# Patient Record
Sex: Female | Born: 1960 | Race: White | Hispanic: No | Smoking: Current every day smoker
Health system: Southern US, Community
[De-identification: ages and names within clinical notes are randomized; demographics above are authoritative.]

## PROBLEM LIST (undated history)

## (undated) DIAGNOSIS — K760 Fatty (change of) liver, not elsewhere classified: Secondary | ICD-10-CM

## (undated) DIAGNOSIS — R0600 Dyspnea, unspecified: Secondary | ICD-10-CM

## (undated) DIAGNOSIS — F1010 Alcohol abuse, uncomplicated: Secondary | ICD-10-CM

## (undated) DIAGNOSIS — K635 Polyp of colon: Secondary | ICD-10-CM

## (undated) DIAGNOSIS — R634 Abnormal weight loss: Secondary | ICD-10-CM

## (undated) DIAGNOSIS — Z972 Presence of dental prosthetic device (complete) (partial): Secondary | ICD-10-CM

## (undated) DIAGNOSIS — J449 Chronic obstructive pulmonary disease, unspecified: Secondary | ICD-10-CM

## (undated) DIAGNOSIS — R748 Abnormal levels of other serum enzymes: Secondary | ICD-10-CM

## (undated) DIAGNOSIS — R1012 Left upper quadrant pain: Secondary | ICD-10-CM

## (undated) DIAGNOSIS — F419 Anxiety disorder, unspecified: Secondary | ICD-10-CM

## (undated) DIAGNOSIS — K219 Gastro-esophageal reflux disease without esophagitis: Secondary | ICD-10-CM

## (undated) DIAGNOSIS — G709 Myoneural disorder, unspecified: Secondary | ICD-10-CM

## (undated) DIAGNOSIS — D696 Thrombocytopenia, unspecified: Secondary | ICD-10-CM

## (undated) DIAGNOSIS — I700 Atherosclerosis of aorta: Secondary | ICD-10-CM

## (undated) DIAGNOSIS — G629 Polyneuropathy, unspecified: Secondary | ICD-10-CM

## (undated) DIAGNOSIS — S72009A Fracture of unspecified part of neck of unspecified femur, initial encounter for closed fracture: Secondary | ICD-10-CM

## (undated) DIAGNOSIS — J40 Bronchitis, not specified as acute or chronic: Secondary | ICD-10-CM

## (undated) DIAGNOSIS — B019 Varicella without complication: Secondary | ICD-10-CM

## (undated) DIAGNOSIS — F329 Major depressive disorder, single episode, unspecified: Secondary | ICD-10-CM

## (undated) DIAGNOSIS — J309 Allergic rhinitis, unspecified: Secondary | ICD-10-CM

## (undated) DIAGNOSIS — F32A Depression, unspecified: Secondary | ICD-10-CM

## (undated) DIAGNOSIS — K469 Unspecified abdominal hernia without obstruction or gangrene: Secondary | ICD-10-CM

## (undated) DIAGNOSIS — K759 Inflammatory liver disease, unspecified: Secondary | ICD-10-CM

## (undated) HISTORY — DX: Major depressive disorder, single episode, unspecified: F32.9

## (undated) HISTORY — PX: CHOLECYSTECTOMY: SHX55

## (undated) HISTORY — DX: Varicella without complication: B01.9

## (undated) HISTORY — PX: NO PAST SURGERIES: SHX2092

## (undated) HISTORY — DX: Polyp of colon: K63.5

## (undated) HISTORY — DX: Alcohol abuse, uncomplicated: F10.10

## (undated) HISTORY — PX: EYE SURGERY: SHX253

## (undated) HISTORY — DX: Depression, unspecified: F32.A

## (undated) HISTORY — PX: COLONOSCOPY: SHX174

---

## 2008-06-26 ENCOUNTER — Emergency Department: Payer: Self-pay | Admitting: Emergency Medicine

## 2013-01-31 LAB — HM COLONOSCOPY

## 2013-02-03 LAB — HM PAP SMEAR

## 2013-02-03 LAB — HM MAMMOGRAPHY: HM Mammogram: NORMAL

## 2013-11-23 ENCOUNTER — Ambulatory Visit: Payer: Self-pay | Admitting: Nurse Practitioner

## 2014-01-29 ENCOUNTER — Ambulatory Visit: Payer: Self-pay | Admitting: Gastroenterology

## 2014-02-14 ENCOUNTER — Ambulatory Visit: Payer: Self-pay | Admitting: Surgery

## 2014-02-21 ENCOUNTER — Ambulatory Visit: Payer: Self-pay | Admitting: Surgery

## 2014-03-14 ENCOUNTER — Ambulatory Visit: Payer: Self-pay | Admitting: Surgery

## 2014-03-28 ENCOUNTER — Ambulatory Visit: Payer: Self-pay | Admitting: Gastroenterology

## 2014-07-12 ENCOUNTER — Inpatient Hospital Stay
Admission: EM | Admit: 2014-07-12 | Discharge: 2014-07-16 | DRG: 641 | Disposition: A | Discharge status: Home / Self Care | Payer: 59 | Attending: Internal Medicine | Admitting: Internal Medicine

## 2014-07-12 ENCOUNTER — Encounter: Payer: Self-pay | Admitting: General Practice

## 2014-07-12 DIAGNOSIS — E8729 Other acidosis: Secondary | ICD-10-CM | POA: Diagnosis present

## 2014-07-12 DIAGNOSIS — Z6824 Body mass index (BMI) 24.0-24.9, adult: Secondary | ICD-10-CM | POA: Diagnosis not present

## 2014-07-12 DIAGNOSIS — K449 Diaphragmatic hernia without obstruction or gangrene: Secondary | ICD-10-CM | POA: Diagnosis present

## 2014-07-12 DIAGNOSIS — E876 Hypokalemia: Secondary | ICD-10-CM | POA: Diagnosis present

## 2014-07-12 DIAGNOSIS — R630 Anorexia: Secondary | ICD-10-CM | POA: Diagnosis present

## 2014-07-12 DIAGNOSIS — F17210 Nicotine dependence, cigarettes, uncomplicated: Secondary | ICD-10-CM | POA: Diagnosis present

## 2014-07-12 DIAGNOSIS — E872 Acidosis, unspecified: Secondary | ICD-10-CM

## 2014-07-12 DIAGNOSIS — E860 Dehydration: Secondary | ICD-10-CM | POA: Diagnosis present

## 2014-07-12 DIAGNOSIS — F1024 Alcohol dependence with alcohol-induced mood disorder: Secondary | ICD-10-CM | POA: Insufficient documentation

## 2014-07-12 DIAGNOSIS — Z716 Tobacco abuse counseling: Secondary | ICD-10-CM | POA: Diagnosis present

## 2014-07-12 DIAGNOSIS — F1021 Alcohol dependence, in remission: Secondary | ICD-10-CM

## 2014-07-12 DIAGNOSIS — F1094 Alcohol use, unspecified with alcohol-induced mood disorder: Secondary | ICD-10-CM

## 2014-07-12 DIAGNOSIS — D6959 Other secondary thrombocytopenia: Secondary | ICD-10-CM | POA: Diagnosis present

## 2014-07-12 DIAGNOSIS — F10239 Alcohol dependence with withdrawal, unspecified: Secondary | ICD-10-CM | POA: Diagnosis present

## 2014-07-12 DIAGNOSIS — F329 Major depressive disorder, single episode, unspecified: Secondary | ICD-10-CM | POA: Diagnosis present

## 2014-07-12 DIAGNOSIS — E8889 Other specified metabolic disorders: Secondary | ICD-10-CM | POA: Diagnosis present

## 2014-07-12 DIAGNOSIS — R112 Nausea with vomiting, unspecified: Secondary | ICD-10-CM

## 2014-07-12 DIAGNOSIS — E873 Alkalosis: Secondary | ICD-10-CM | POA: Diagnosis present

## 2014-07-12 DIAGNOSIS — E871 Hypo-osmolality and hyponatremia: Secondary | ICD-10-CM | POA: Diagnosis present

## 2014-07-12 DIAGNOSIS — K2920 Alcoholic gastritis without bleeding: Secondary | ICD-10-CM | POA: Diagnosis present

## 2014-07-12 DIAGNOSIS — F10939 Alcohol use, unspecified with withdrawal, unspecified: Secondary | ICD-10-CM

## 2014-07-12 DIAGNOSIS — R197 Diarrhea, unspecified: Secondary | ICD-10-CM

## 2014-07-12 HISTORY — DX: Unspecified abdominal hernia without obstruction or gangrene: K46.9

## 2014-07-12 HISTORY — DX: Other acidosis: E87.29

## 2014-07-12 LAB — SALICYLATE LEVEL: Salicylate Lvl: <4 mg/dL (ref 2.8–30.0)

## 2014-07-12 LAB — ACETAMINOPHEN LEVEL: Acetaminophen (Tylenol), Serum: <10 ug/mL — ABNORMAL LOW (ref 10–30)

## 2014-07-12 LAB — BASIC METABOLIC PANEL
Anion gap: 30 — ABNORMAL HIGH (ref 5–15)
BUN: 10 mg/dL (ref 6–20)
CO2: 10 mmol/L — ABNORMAL LOW (ref 22–32)
Calcium: 9.6 mg/dL (ref 8.9–10.3)
Chloride: 96 mmol/L — ABNORMAL LOW (ref 101–111)
Creatinine, Ser: 0.95 mg/dL (ref 0.44–1.00)
GFR calc Af Amer: >60 mL/min (ref >60)
GFR calc non Af Amer: >60 mL/min (ref >60)
Glucose, Bld: 112 mg/dL — ABNORMAL HIGH (ref 65–99)
Potassium: 4.2 mmol/L (ref 3.5–5.1)
Sodium: 136 mmol/L (ref 135–145)

## 2014-07-12 LAB — LACTIC ACID, PLASMA
Lactic Acid, Venous: 1.7 mmol/L (ref 0.5–2.0)
Lactic Acid, Venous: 1.8 mmol/L (ref 0.5–2.0)

## 2014-07-12 LAB — CBC WITH DIFFERENTIAL/PLATELET
Basophils Absolute: 0.1 10*3/uL (ref 0–0.1)
Basophils Relative: 1 %
Eosinophils Absolute: 0 10*3/uL (ref 0–0.7)
Eosinophils Relative: 0 %
HCT: 47.3 % — ABNORMAL HIGH (ref 35.0–47.0)
Hemoglobin: 15.8 g/dL (ref 12.0–16.0)
Lymphocytes Relative: 14 %
Lymphs Abs: 1.3 10*3/uL (ref 1.0–3.6)
MCH: 37.6 pg — ABNORMAL HIGH (ref 26.0–34.0)
MCHC: 33.5 g/dL (ref 32.0–36.0)
MCV: 112.2 fL — ABNORMAL HIGH (ref 80.0–100.0)
Monocytes Absolute: 0.6 10*3/uL (ref 0.2–0.9)
Monocytes Relative: 6 %
Neutro Abs: 7.1 10*3/uL — ABNORMAL HIGH (ref 1.4–6.5)
Neutrophils Relative %: 79 %
Platelets: 164 10*3/uL (ref 150–440)
RBC: 4.21 MIL/uL (ref 3.80–5.20)
RDW: 14.6 % — ABNORMAL HIGH (ref 11.5–14.5)
WBC: 9.1 10*3/uL (ref 3.6–11.0)

## 2014-07-12 LAB — ETHANOL: Alcohol, Ethyl (B): 221 mg/dL — ABNORMAL HIGH (ref <5)

## 2014-07-12 LAB — GLUCOSE, CAPILLARY: Glucose-Capillary: 112 mg/dL — ABNORMAL HIGH (ref 65–99)

## 2014-07-12 LAB — LIPASE, BLOOD: Lipase: 141 U/L — ABNORMAL HIGH (ref 22–51)

## 2014-07-12 MED ORDER — ACETAMINOPHEN 650 MG RE SUPP: 650.0000 mg | Freq: Four times a day (QID) | RECTAL | Status: DC | PRN

## 2014-07-12 MED ORDER — ALUM & MAG HYDROXIDE-SIMETH 200-200-20 MG/5ML PO SUSP: 30.0000 mL | Freq: Four times a day (QID) | ORAL | Status: DC | PRN

## 2014-07-12 MED ORDER — THIAMINE HCL 100 MG/ML IJ SOLN: 100.0000 mg | Freq: Every day | INTRAMUSCULAR | Status: DC

## 2014-07-12 MED ORDER — ONDANSETRON HCL 4 MG PO TABS
4.0000 mg | ORAL_TABLET | ORAL | Status: DC
  Administered 2014-07-13 – 2014-07-16 (×16): 4 mg via ORAL
  Filled 2014-07-12 (×16): qty 1

## 2014-07-12 MED ORDER — ESCITALOPRAM OXALATE 10 MG PO TABS
10.0000 mg | ORAL_TABLET | Freq: Every day | ORAL | Status: DC
  Administered 2014-07-12 – 2014-07-16 (×5): 10 mg via ORAL
  Filled 2014-07-12 (×5): qty 1

## 2014-07-12 MED ORDER — ONDANSETRON HCL 4 MG/2ML IJ SOLN
4.0000 mg | INTRAMUSCULAR | Status: DC
  Administered 2014-07-13 – 2014-07-16 (×4): 4 mg via INTRAVENOUS
  Filled 2014-07-12 (×4): qty 2

## 2014-07-12 MED ORDER — ONDANSETRON HCL 4 MG/2ML IJ SOLN
INTRAMUSCULAR | Status: AC
  Filled 2014-07-12: qty 2

## 2014-07-12 MED ORDER — SODIUM CHLORIDE 0.9 % IJ SOLN
3.0000 mL | Freq: Two times a day (BID) | INTRAMUSCULAR | Status: DC
  Administered 2014-07-13 – 2014-07-16 (×4): 3 mL via INTRAVENOUS

## 2014-07-12 MED ORDER — ADULT MULTIVITAMIN W/MINERALS CH
1.0000 | ORAL_TABLET | Freq: Every day | ORAL | Status: DC
  Administered 2014-07-12 – 2014-07-16 (×5): 1 via ORAL
  Filled 2014-07-12 (×5): qty 1

## 2014-07-12 MED ORDER — SODIUM CHLORIDE 0.9 % IV BOLUS (SEPSIS)
1000.0000 mL | Freq: Once | INTRAVENOUS | Status: AC
  Administered 2014-07-12: 1000 mL via INTRAVENOUS

## 2014-07-12 MED ORDER — ACETAMINOPHEN 325 MG PO TABS
650.0000 mg | ORAL_TABLET | Freq: Four times a day (QID) | ORAL | Status: DC | PRN
  Administered 2014-07-13 – 2014-07-15 (×5): 650 mg via ORAL
  Filled 2014-07-12 (×5): qty 2
  Filled 2014-07-12: qty 1

## 2014-07-12 MED ORDER — ONDANSETRON HCL 4 MG/2ML IJ SOLN
4.0000 mg | Freq: Once | INTRAMUSCULAR | Status: AC
  Administered 2014-07-12: 4 mg via INTRAVENOUS

## 2014-07-12 MED ORDER — ONDANSETRON HCL 4 MG/2ML IJ SOLN: 4.0000 mg | Freq: Once | INTRAMUSCULAR | Status: DC

## 2014-07-12 MED ORDER — PROMETHAZINE HCL 25 MG/ML IJ SOLN
INTRAMUSCULAR | Status: AC
  Administered 2014-07-12: 25 mg via INTRAVENOUS
  Filled 2014-07-12: qty 1

## 2014-07-12 MED ORDER — ACETAMINOPHEN 500 MG PO TABS
ORAL_TABLET | ORAL | Status: AC
  Administered 2014-07-12: 1000 mg via ORAL
  Filled 2014-07-12: qty 2

## 2014-07-12 MED ORDER — THIAMINE HCL 100 MG/ML IJ SOLN
Freq: Once | INTRAMUSCULAR | Status: AC
Start: 2014-07-12 — End: 2014-07-12
  Administered 2014-07-12: 20:00:00 via INTRAVENOUS
  Filled 2014-07-12: qty 1000

## 2014-07-12 MED ORDER — ONDANSETRON HCL 4 MG/2ML IJ SOLN
INTRAMUSCULAR | Status: AC
  Administered 2014-07-12: 4 mg via INTRAVENOUS
  Filled 2014-07-12: qty 2

## 2014-07-12 MED ORDER — LORAZEPAM 2 MG/ML IJ SOLN: 0.0000 mg | Freq: Two times a day (BID) | INTRAMUSCULAR | Status: AC

## 2014-07-12 MED ORDER — PROMETHAZINE HCL 25 MG/ML IJ SOLN
12.5000 mg | Freq: Once | INTRAMUSCULAR | Status: AC
  Administered 2014-07-12: 22:00:00 via INTRAVENOUS
  Filled 2014-07-12: qty 1

## 2014-07-12 MED ORDER — HEPARIN SODIUM (PORCINE) 5000 UNIT/ML IJ SOLN
5000.0000 [IU] | Freq: Three times a day (TID) | INTRAMUSCULAR | Status: DC
  Administered 2014-07-12 – 2014-07-15 (×8): 5000 [IU] via SUBCUTANEOUS
  Filled 2014-07-12 (×8): qty 1

## 2014-07-12 MED ORDER — LORAZEPAM 2 MG PO TABS: 0.0000 mg | ORAL_TABLET | Freq: Two times a day (BID) | ORAL | Status: DC

## 2014-07-12 MED ORDER — LORAZEPAM 1 MG PO TABS
1.0000 mg | ORAL_TABLET | Freq: Four times a day (QID) | ORAL | Status: AC | PRN
  Filled 2014-07-12 (×2): qty 1

## 2014-07-12 MED ORDER — FOLIC ACID 1 MG PO TABS
1.0000 mg | ORAL_TABLET | Freq: Every day | ORAL | Status: DC
  Administered 2014-07-12 – 2014-07-16 (×5): 1 mg via ORAL
  Filled 2014-07-12 (×5): qty 1

## 2014-07-12 MED ORDER — ONDANSETRON HCL 4 MG/2ML IJ SOLN
4.0000 mg | Freq: Four times a day (QID) | INTRAMUSCULAR | Status: DC | PRN
  Administered 2014-07-12: 4 mg via INTRAVENOUS
  Filled 2014-07-12: qty 2

## 2014-07-12 MED ORDER — ONDANSETRON HCL 4 MG PO TABS: 4.0000 mg | ORAL_TABLET | Freq: Four times a day (QID) | ORAL | Status: DC | PRN

## 2014-07-12 MED ORDER — ACETAMINOPHEN 500 MG PO TABS
1000.0000 mg | ORAL_TABLET | Freq: Once | ORAL | Status: AC
  Administered 2014-07-12: 1000 mg via ORAL

## 2014-07-12 MED ORDER — SENNOSIDES-DOCUSATE SODIUM 8.6-50 MG PO TABS
1.0000 | ORAL_TABLET | Freq: Every evening | ORAL | Status: DC | PRN
Start: 2014-07-12 — End: 2014-07-16

## 2014-07-12 MED ORDER — VITAMIN B-1 100 MG PO TABS
100.0000 mg | ORAL_TABLET | Freq: Every day | ORAL | Status: DC
  Administered 2014-07-13 – 2014-07-16 (×4): 100 mg via ORAL
  Filled 2014-07-12 (×5): qty 1

## 2014-07-12 MED ORDER — LORAZEPAM 2 MG PO TABS
0.0000 mg | ORAL_TABLET | Freq: Four times a day (QID) | ORAL | Status: DC
  Administered 2014-07-13: 1 mg via ORAL

## 2014-07-12 MED ORDER — LORAZEPAM 2 MG/ML IJ SOLN: 0.0000 mg | Freq: Two times a day (BID) | INTRAMUSCULAR | Status: DC

## 2014-07-12 MED ORDER — LORAZEPAM 2 MG PO TABS
0.0000 mg | ORAL_TABLET | Freq: Two times a day (BID) | ORAL | Status: DC
Start: 2014-07-14 — End: 2014-07-16
  Administered 2014-07-15: 1 mg via ORAL

## 2014-07-12 MED ORDER — LORAZEPAM 2 MG/ML IJ SOLN: 0.0000 mg | Freq: Four times a day (QID) | INTRAMUSCULAR | Status: DC

## 2014-07-12 MED ORDER — PROMETHAZINE HCL 25 MG/ML IJ SOLN
25.0000 mg | Freq: Once | INTRAMUSCULAR | Status: AC
  Administered 2014-07-12: 25 mg via INTRAVENOUS

## 2014-07-12 MED ORDER — CHLORDIAZEPOXIDE HCL 25 MG PO CAPS
25.0000 mg | ORAL_CAPSULE | Freq: Three times a day (TID) | ORAL | Status: DC
  Administered 2014-07-12 – 2014-07-16 (×11): 25 mg via ORAL
  Filled 2014-07-12 (×11): qty 1

## 2014-07-12 MED ORDER — PANTOPRAZOLE SODIUM 40 MG PO TBEC
40.0000 mg | DELAYED_RELEASE_TABLET | Freq: Every day | ORAL | Status: DC
  Administered 2014-07-13 – 2014-07-16 (×4): 40 mg via ORAL
  Filled 2014-07-12 (×4): qty 1

## 2014-07-12 MED ORDER — LORAZEPAM 2 MG/ML IJ SOLN
1.0000 mg | Freq: Four times a day (QID) | INTRAMUSCULAR | Status: AC | PRN
Start: 2014-07-12 — End: 2014-07-15

## 2014-07-12 NOTE — ED Notes (Signed)
Pt was given Sprite at this time. Will continue to assess.

## 2014-07-12 NOTE — ED Provider Notes (Signed)
[facility] Emergency Department Provider Note  Time seen: 9:19 AM  I have reviewed the triage vital signs and the nursing notes.   HISTORY  Chief Complaint Vomiting and Dehydration    HPI Kathy [name] is a 54 y.o. female with a past medical history of a hiatal hernia who presents the emergency department or nausea, vomiting, diarrhea. According to the patient beginning yesterday afternoon she had perfuse diarrhea followed by nausea and vomiting. The diarrhea has stopped but the nausea and vomiting continues today. Patient called her primary care doctor who referred her to the emergency department for evaluation and likely IV fluids. Patient denies any abdominal pain besides mild epigastric pain when vomiting/heaving. Describes her pain as minimal. Describes her nausea as moderate. No modifying factors identified. The patient has not attempted to eat or drink due to nausea.     Past Medical History  Diagnosis Date  . Hernia of abdominal cavity     There are no active problems to display for this patient.   History reviewed. No pertinent past surgical history.  No current outpatient prescriptions on file.  Allergies Aspirin  No family history on file.  Social History History  Substance Use Topics  . Smoking status: Current Every Day Smoker -- 0.50 packs/day    Types: Cigarettes  . Smokeless tobacco: Never Used  . Alcohol Use: No    Review of Systems Constitutional: Negative for fever. Cardiovascular: Negative for chest pain. Respiratory: Negative for shortness of breath. Gastrointestinal: Patient states her abdominal discomfort is unchanged largely from her baseline. Positive vomiting/diarrhea Genitourinary: Negative for dysuria. Musculoskeletal: Negative for back pain. 10-point ROS otherwise negative.  ____________________________________________   PHYSICAL EXAM:  VITAL SIGNS: ED Triage Vitals  Enc Vitals Group     BP  07/12/14 0846 132/82 mmHg     Pulse Rate 07/12/14 0846 125     Resp 07/12/14 0846 18     Temp 07/12/14 0846 97.4 F (36.3 C)     Temp Source 07/12/14 0846 Oral     SpO2 07/12/14 0846 99 %     Weight 07/12/14 0846 132 lb (59.875 kg)     Height 07/12/14 0846 5\' 2"  (1.575 m)     Head Cir --      Peak Flow --      Pain Score 07/12/14 0847 0     Pain Loc --      Pain Edu? --      Excl. in [place]? --     Constitutional: Alert and oriented. Well appearing and in no distress. Eyes: Normal exam ENT   Mouth/Throat: Dry mucous membranes. Cardiovascular: Rate around 120 bpm, regular rhythm. No murmurs, rubs, or gallops. Respiratory: Normal respiratory effort without tachypnea nor retractions. Breath sounds are clear Gastrointestinal: Soft and nontender. No distention.   Musculoskeletal: Nontender with normal range of motion in all extremities.  Neurologic:  Normal speech and language. No gross focal neurologic deficits Skin:  Skin is warm, dry and intact.  Psychiatric: Mood and affect are normal. Speech and behavior are normal.  ____________________________________________   INITIAL IMPRESSION / ASSESSMENT AND PLAN / ED COURSE  Pertinent labs & imaging results that were available during my care of the patient were reviewed by me and considered in my medical decision making (see chart for details).  54 year old female presents for nausea/vomiting/diarrhea. She states he diarrhea has stopped, but the nausea/vomiting continues. Given the patient's history of a hiatal hernia with an operation planned for next month  I discussed with the patient's CT versus three-way abdominal x-ray imaging, however patient states her pain is no worse than normal and she wishes to avoid any abdominal imaging. I offered to treat her pain she said it is not that bad and does not wish for any pain medication this time. Patient states she is just here for IV fluids and to treat her nausea. We will check basic labs, IV  hydrate, treat her nausea, and closely monitor in the emergency department. Patient agreeable to this plan.  ----------------------------------------- 2:30 PM on 07/12/2014 -----------------------------------------  Patient's anion Of 30 consistent with severe dehydration. Venous blood gas shows a pH is 7.03. Pulse remains elevated 1 teens to 120s. The daughter has discussed with me that the patient is a chronic alcoholic and drinks liquor heavily/daily. We'll place on see what precautions. Lactic acid returned at 1.7 and patient continues to have a nontender abdomen. Do not believe the patient requires imaging currently. We will admit for IV hydration given the severe amount of dehydration and lower pH. Patient is agreeable to plan. ____________________________________________   FINAL CLINICAL IMPRESSION(S) / ED DIAGNOSES  Gastroenteritis Severe dehydration   Harvest Dark, MD 07/12/14 1431

## 2014-07-12 NOTE — ED Notes (Signed)
Pt. Arrived to ed from home with reports of experiencing nausea and vomiting since 2am.  Pt reprots she was sent by PCP for IV fluids. Pt alert and oriented.  Pt reports experiencing pain where she has a hernia. Pt alert and oriented.

## 2014-07-12 NOTE — ED Notes (Signed)
Pt up to bathroom in room.

## 2014-07-12 NOTE — H&P (Signed)
[place] [facility] [place] NAME: Kathy [name]    MR#:  [id number]  DATE OF BIRTH:  1960/05/01  DATE OF ADMISSION:  07/12/2014  PRIMARY CARE PHYSICIAN: Dion Body, MD   REQUESTING/REFERRING PHYSICIAN: Dr. Wilnette Kales  CHIEF COMPLAINT:   Nausea, vomiting, and diarrhea for the past 2 days HISTORY OF PRESENT ILLNESS:  Kathy [name]  is a 54 y.o. female with a known history of renal hernia, alcohol and tobacco dependence who presents with above complaint. Patient reports nausea, vomiting and diarrhea for the past 2 days. She denies any fever or chills. She denies abdominal pain. She's had poor appetite for 2 days. She continues to drink on call. She is scheduled for a hiatal hernia surgery in a partially 2 months however she needs to stop drinking and stop smoking prior to the surgery. She denies any intoxication with illicit drugs or isopropyl alcohol or methanol. She does not take aspirin as she is allergic to aspirin. She presents to the emergency room and is found to have severe acidosis with an anion gap of 30 and CO2 of 10.  PAST MEDICAL HISTORY:   Past Medical History  Diagnosis Date  . Hernia of abdominal cavity    depression EtOH dependence Tobacco dependence  PAST SURGICAL HISTORY:  No surgeries  SOCIAL HISTORY:   History  Substance Use Topics  . Smoking status: Current Every Day Smoker -- 0.50 packs/day    Types: Cigarettes  . Smokeless tobacco: Never Used  . Alcohol Use: No   Patient drinks 6 drinks per day for the past 30 years. She smokes half pack a day. She was formally smoking 1 pack a day. FAMILY HISTORY:  No history of CAD or hypertension in the family.  DRUG ALLERGIES:   Allergies  Allergen Reactions  . Aspirin Swelling     REVIEW OF SYSTEMS:  CONSTITUTIONAL: No fever, positive fatigue and weakness EYES: No blurred or double vision.  EARS, NOSE, AND THROAT: No tinnitus or ear pain.  RESPIRATORY:  No cough, shortness of breath, wheezing or hemoptysis.  CARDIOVASCULAR: No chest pain, orthopnea, edema.  GASTROINTESTINAL: Positive nausea, vomiting, and diarrhea no abdominal pain GENITOURINARY: No dysuria, hematuria.  ENDOCRINE: No polyuria, nocturia,  HEMATOLOGY: No anemia, easy bruising or bleeding SKIN: No rash or lesion. MUSCULOSKELETAL: No joint pain or arthritis.   NEUROLOGIC: No tingling, numbness, weakness.  PSYCHIATRY: Positive  depression  MEDICATIONS AT HOME:   Prior to Admission medications   Medication Sig Start Date End Date Taking? Authorizing Provider  amoxicillin (AMOXIL) 500 MG capsule Take 500 mg by mouth 3 (three) times daily.   Yes Historical Provider, MD  cetirizine (ZYRTEC) 10 MG tablet Take 10 mg by mouth as needed for allergies.   Yes Historical Provider, MD  escitalopram (LEXAPRO) 10 MG tablet Take 10 mg by mouth daily.   Yes Historical Provider, MD  Ketotifen Fumarate (ALAWAY OP) Apply 1 drop to eye daily. 0.035%   Yes Historical Provider, MD  omeprazole (PRILOSEC) 20 MG capsule Take 20 mg by mouth daily.   Yes Historical Provider, MD      VITAL SIGNS:  Blood pressure 113/86, pulse 128, temperature 97.4 F (36.3 C), temperature source Oral, resp. rate 18, height 5\' 2"  (1.575 m), weight 59.875 kg (132 lb), SpO2 99 %.  PHYSICAL EXAMINATION:  GENERAL:  54 y.o.-year-old patient lying in the bed with no acute distress.  EYES: Pupils equal, round, reactive to light and accommodation. No scleral icterus.  Extraocular muscles intact.  HEENT: Head atraumatic, normocephalic. Oropharynx and nasopharynx clear.  NECK:  Supple, no jugular venous distention. No thyroid enlargement, no tenderness.  LUNGS: Normal breath sounds bilaterally, no wheezing, rales,rhonchi or crepitation. No use of accessory muscles of respiration.  CARDIOVASCULAR: S1, S2 normal. No murmurs, rubs, or gallops.  ABDOMEN: Soft, nontender, nondistended. Bowel sounds present. No organomegaly or  mass.  EXTREMITIES: No pedal edema, cyanosis, or clubbing.  NEUROLOGIC: Cranial nerves II through XII are intact. Muscle strength 5/5 in all extremities no asterixis.  PSYCHIATRIC: The patient is alert and oriented x 3.  SKIN: No obvious rash, lesion, or ulcer.   LABORATORY PANEL:   CBC  Recent Labs Lab 07/12/14 0909  WBC 9.1  HGB 15.8  HCT 47.3*  PLT 164   ------------------------------------------------------------------------------------------------------------------  Chemistries   Recent Labs Lab 07/12/14 0909  NA 136  K 4.2  CL 96*  CO2 10*  GLUCOSE 112*  BUN 10  CREATININE 0.95  CALCIUM 9.6   ------------------------------------------------------------------------------------------------------------------  Cardiac Enzymes No results for input(s): TROPONINI in the last 168 hours. ------------------------------------------------------------------------------------------------------------------  RADIOLOGY:  No results found.  EKG:  No orders found for this or any previous visit.  IMPRESSION AND PLAN:  This is a 54 year old female with our call and tobacco dependence who presents with nausea, vomiting and diarrhea. For the past 2 days.  1. Severe acidosis, anion gap: Patient has a bicarbonate of 10 and an gap of 30. She denies methanol use or ethylene glycol or isopropyl use. I suspect that she could be severely acidotic from plain dehydration. She is not in DKA nor has she taken aspirin. She is not uremic or has nor has she ingested INH. For her severe acidosis I will continue IV fluids. We'll continue to follow her anion gap and bicarbonate level. Her lactic acid is also normal If patient is severely acidotic in the a.m. we will consider nephrology consultation for further evaluation and management. I will also repeat a BMP later this evening if she continues to be acidotic I will consider adding bicarbonate.  2.nausea and vomiting with diarrhea: I suspect this  could be related to viral etiology. I will order stool cultures. Supportive care for nausea and vomiting.  3.Tobacco dependence: Patient encouraged to stop smoking. She does not want a nicotine patch. She was counseled for 3 minutes.  4. EtOH dependence: Patient will likely go through withdrawal. I started Librium 25 by mouth 3 times a day and CIWA protocol. I have also consult with psychiatry for further evaluation and management.  5. Depression: I will continue Lexapro.    All the records are reviewed and case discussed with ED provider. Management plans discussed with the patient and she is in agreement.  CODE STATUS: FULL  TOTAL TIME TAKING CARE OF THIS PATIENT: 50 minutes.    Tami Blass M.D on 07/12/2014 at 3:22 PM  Between 7am to 6pm - Pager - [phone] After 6pm go to [email or web address] - password [email or web address] [place] [facility]  Office  [phone]  CC: Primary care physician; Dion Body, MD

## 2014-07-12 NOTE — Progress Notes (Signed)
Chaplain met with patient and patient caregiver by her bedside, patient states she needs rest; chaplain offered encouragement. Loralyn Freshwater D. Alroy Dust Thursday 07-12-2014   07/12/14 1850  Clinical Encounter Type  Visited With Patient and family together  Visit Type Initial  Referral From Nurse  Consult/Referral To Chaplain  Spiritual Encounters  Spiritual Needs Other (Comment) (could not assess)  Stress Factors  Patient Stress Factors Health changes  Family Stress Factors Health changes

## 2014-07-12 NOTE — Progress Notes (Signed)
Chaplain met with patient and patient caregiver, chaplain offered encouragement. Loralyn Freshwater D. Alroy Dust Thursday 07-12-2014   07/12/14 1850  Clinical Encounter Type  Visited With Patient and family together  Visit Type Initial  Referral From Nurse  Consult/Referral To Chaplain  Spiritual Encounters  Spiritual Needs Other (Comment) (could not assess)  Stress Factors  Patient Stress Factors Health changes  Family Stress Factors Health changes

## 2014-07-13 DIAGNOSIS — F1021 Alcohol dependence, in remission: Secondary | ICD-10-CM

## 2014-07-13 DIAGNOSIS — F10939 Alcohol use, unspecified with withdrawal, unspecified: Secondary | ICD-10-CM

## 2014-07-13 DIAGNOSIS — F10239 Alcohol dependence with withdrawal, unspecified: Secondary | ICD-10-CM

## 2014-07-13 DIAGNOSIS — F1094 Alcohol use, unspecified with alcohol-induced mood disorder: Secondary | ICD-10-CM

## 2014-07-13 LAB — CBC
HCT: 40.7 % (ref 35.0–47.0)
Hemoglobin: 13.3 g/dL (ref 12.0–16.0)
MCH: 37 pg — ABNORMAL HIGH (ref 26.0–34.0)
MCHC: 32.7 g/dL (ref 32.0–36.0)
MCV: 113.1 fL — ABNORMAL HIGH (ref 80.0–100.0)
Platelets: 122 10*3/uL — ABNORMAL LOW (ref 150–440)
RBC: 3.6 MIL/uL — ABNORMAL LOW (ref 3.80–5.20)
RDW: 14.7 % — ABNORMAL HIGH (ref 11.5–14.5)
WBC: 7.4 10*3/uL (ref 3.6–11.0)

## 2014-07-13 LAB — BLOOD GAS, VENOUS
Patient temperature: 37
pCO2, Ven: 32 mmHg — ABNORMAL LOW (ref 44.0–60.0)
pH, Ven: 7.03 — CL (ref 7.320–7.430)

## 2014-07-13 LAB — BLOOD GAS, ARTERIAL
Acid-base deficit: 17.8 mmol/L — ABNORMAL HIGH (ref 0.0–2.0)
Bicarbonate: 8.2 mEq/L — ABNORMAL LOW (ref 21.0–28.0)
FIO2: 21 %
O2 Saturation: 96.3 %
Patient temperature: 37
pCO2 arterial: 21 mmHg — ABNORMAL LOW (ref 32.0–48.0)
pH, Arterial: 7.2 — ABNORMAL LOW (ref 7.350–7.450)
pO2, Arterial: 102 mmHg (ref 83.0–108.0)

## 2014-07-13 LAB — BASIC METABOLIC PANEL
Anion gap: 17 — ABNORMAL HIGH (ref 5–15)
BUN: 5 mg/dL — ABNORMAL LOW (ref 6–20)
CO2: 11 mmol/L — ABNORMAL LOW (ref 22–32)
Calcium: 8.6 mg/dL — ABNORMAL LOW (ref 8.9–10.3)
Chloride: 104 mmol/L (ref 101–111)
Creatinine, Ser: 0.75 mg/dL (ref 0.44–1.00)
GFR calc Af Amer: >60 mL/min (ref >60)
GFR calc non Af Amer: >60 mL/min (ref >60)
Glucose, Bld: 99 mg/dL (ref 65–99)
Potassium: 4.2 mmol/L (ref 3.5–5.1)
Sodium: 132 mmol/L — ABNORMAL LOW (ref 135–145)

## 2014-07-13 MED ORDER — SODIUM BICARBONATE 8.4 % IV SOLN
INTRAVENOUS | Status: DC
  Administered 2014-07-13: 16:00:00 via INTRAVENOUS
  Filled 2014-07-13 (×7): qty 150

## 2014-07-13 MED ORDER — NICOTINE 10 MG IN INHA
1.0000 | RESPIRATORY_TRACT | Status: DC | PRN
  Filled 2014-07-13: qty 36

## 2014-07-13 MED ORDER — DEXTROSE 5 % IV SOLN
INTRAVENOUS | Status: DC
  Filled 2014-07-13 (×2): qty 100

## 2014-07-13 NOTE — Clinical Social Work Note (Signed)
Clinical Social Work Assessment  Patient Details  Name: Kathy [name] MRN: [id number] Date of Birth: Aug 22, 1960  Date of referral:  07/13/14               Reason for consult:  Substance Use/ETOH Abuse                Permission sought to share information with:  Family Supports Permission granted to share information::  Yes, Verbal Permission Granted  Name::        Agency::     Relationship::     Contact Information:     Housing/Transportation Living arrangements for the past 2 months:   (home) Source of Information:  Patient, Parent Patient Interpreter Needed:  None Criminal Activity/Legal Involvement Pertinent to Current Situation/Hospitalization:  Yes Significant Relationships:  Adult Children, Parents Lives with:    Do you feel safe going back to the place where you live?  Yes Need for family participation in patient care:  No (Coment)  Care giving concerns:  Parents concerned for patient's well being   Facilities manager / plan:  CSW spoke with patient this afternoon and her father was present and patient allowed him to stay through assessment. When patient's father asked what I did, patient responded with "she is probably going to ask the same questions the psychiatrist did...like how many drinks I have a day, etc." Patient willing to speak with CSW and she stated that she typically drinks wine and beer and drinks about 4 to 5 glasses/cans a day. Patient reports that she has upcoming hernia surgery in a few months that she will need to stop drinking in order to have the surgery so this is why she intends to stop. Patient reports that she has a friend that recently stopped drinking and states she will be a good support person. Patient reports that she had a DWI about 5 years ago and lost her license and never reinstated it but is trying to reinstate it now and so she states she has cut down on her drinking as a result. She also stated that the only time she has been to  rehab has been because she was mandated by the courts due to her DWI. She states that the only lengthy period of time she has been sober has been during her pregnancies. She states that her drinking increased when she became divorced 10 years ago. She did not seek counseling at that time.    When asked if she wanted resources to assist her in quitting, patient did not answer and her father interjected telling her that he felt she needed it. Patient still did not respond right away but then stated, "I guess so." CSW provided patient with rehab resources but patient does not seem too vested in quitting long term or for the right reasons.   Patient admits to getting her own alcohol but patient's father also admits that he and his wife drink in the home but are going to stop drinking now. It appears that patient's parents might be enabling the situation and have for a long time as patient has lived with her parents for years and patient's daughter informed CSW earlier today in passing that patient has been drinking for 30 years. CSW advised patient's father that there can be no alcohol drinks in the home and they cannot drink around her if they want to assist her in becoming sober.   SBIRT to be completed by CSW.   Employment status:  Unemployed Insurance information:    PT Recommendations:  Not assessed at this time Information / Referral to community resources:     Patient/Family's Response to care:  Father was encouraging   Patient/Family's Understanding of and Emotional Response to Diagnosis, Current Treatment, and Prognosis:  Patient understands that if she doesn't stop drinking she cannot have her surgery, but appears ambivalent to her drinking and its long term implications.   Emotional Assessment Appearance:  Appears older than stated age Attitude/Demeanor/Rapport:   (casual in her demeanor) Affect (typically observed):  Pleasant, Adaptable Orientation:  Oriented to Self, Oriented to Place,  Oriented to  Time, Oriented to Situation Alcohol / Substance use:  Alcohol Use Psych involvement (Current and /or in the community):  Yes (Comment)  Discharge Needs  Concerns to be addressed:  Substance Abuse Concerns Readmission within the last 30 days:  No Current discharge risk:  Substance Abuse Barriers to Discharge:  No Barriers Identified   Shela Leff, LCSW 07/13/2014, 3:33 PM

## 2014-07-13 NOTE — Consult Note (Signed)
[facility] CONSULT NOTE    Date: 07/13/2014                  Patient Name:  Kathy [name]  MRN: [id number]  DOB: 1960/10/15  Age / Sex: 54 y.o., female         PCP: Dion Body, MD                 Service Requesting Consult: Internal Medicine/Dr. Reva Bores                 Reason for Consult: Metabolic acidosis            History of Present Illness: Patient is a 54 y.o. female with a PMHx of hernia and alcohol abuse, who was admitted to [facility] on 07/12/2014 for evaluation of nausea, vomiting, and weakness.  She reports that she's had these symptoms for the past several days. She has no history of alcohol abuse. She reports that she has 6 alcoholic beverages to drink daily and has been doing this for the past 20 years. We have been asked to see her for evaluation management of metabolic acidosis. ABG has demonstrated a pH of 7.2. Serum bicarbonate was also found to be quite low. The patient denies ingestion of ethylene glycol, isopropyl alcohol, or any other occult substance. Her anion gap was noted as being 17. Patient's daughter reports that she has not eaten solid food for at least 3 days. Therefore starvation ketosis may be playing a role. The patient has also had some loose stools which may be contributing to bicarbonate loss.     Medications: Outpatient medications: Prescriptions prior to admission  Medication Sig Dispense Refill Last Dose  . amoxicillin (AMOXIL) 500 MG capsule Take 500 mg by mouth 3 (three) times daily.   07/11/2014 at Unknown time  . cetirizine (ZYRTEC) 10 MG tablet Take 10 mg by mouth as needed for allergies.   unknown  . escitalopram (LEXAPRO) 10 MG tablet Take 10 mg by mouth daily.   07/11/2014 at Unknown time  . Ketotifen Fumarate (ALAWAY OP) Apply 1 drop to eye daily. 0.035%   07/11/2014 at Unknown time  . omeprazole (PRILOSEC) 20 MG capsule Take 20 mg by mouth daily.   07/11/2014 at Unknown time    Current  medications: Current Facility-Administered Medications  Medication Dose Route Frequency Provider Last Rate Last Dose  . acetaminophen (TYLENOL) tablet 650 mg  650 mg Oral Q6H PRN Bettey Costa, MD   650 mg at 07/13/14 0636   Or  . acetaminophen (TYLENOL) suppository 650 mg  650 mg Rectal Q6H PRN Bettey Costa, MD      . alum & mag hydroxide-simeth (MAALOX/MYLANTA) 200-200-20 MG/5ML suspension 30 mL  30 mL Oral Q6H PRN Bettey Costa, MD      . chlordiazePOXIDE (LIBRIUM) capsule 25 mg  25 mg Oral TID Bettey Costa, MD   25 mg at 07/13/14 0939  . escitalopram (LEXAPRO) tablet 10 mg  10 mg Oral Daily Bettey Costa, MD   10 mg at 07/13/14 0939  . folic acid (FOLVITE) tablet 1 mg  1 mg Oral Daily Sital Mody, MD   1 mg at 07/13/14 0939  . heparin injection 5,000 Units  5,000 Units Subcutaneous 3 times per day Bettey Costa, MD   5,000 Units at 07/13/14 [id number]  . LORazepam (ATIVAN) injection 0-4 mg  0-4 mg Intravenous 4 times per day Harvest Dark, MD   0 mg at 07/12/14 1445  . [START  ON 07/14/2014] LORazepam (ATIVAN) injection 0-4 mg  0-4 mg Intravenous Q12H Sital Mody, MD      . LORazepam (ATIVAN) tablet 1 mg  1 mg Oral Q6H PRN Bettey Costa, MD       Or  . LORazepam (ATIVAN) injection 1 mg  1 mg Intravenous Q6H PRN Sital Mody, MD      . LORazepam (ATIVAN) tablet 0-4 mg  0-4 mg Oral 4 times per day Harvest Dark, MD   1 mg at 07/13/14 0005  . [START ON 07/14/2014] LORazepam (ATIVAN) tablet 0-4 mg  0-4 mg Oral Q12H Sital Mody, MD      . multivitamin with minerals tablet 1 tablet  1 tablet Oral Daily Bettey Costa, MD   1 tablet at 07/13/14 0939  . nicotine (NICOTROL) 10 MG inhaler 1 continuous puffing  1 continuous puffing Inhalation PRN Vaughan Basta, MD      . ondansetron (ZOFRAN) injection 4 mg  4 mg Intravenous Q4H Lytle Butte, MD   4 mg at 07/13/14 0636   Or  . ondansetron (ZOFRAN) tablet 4 mg  4 mg Oral Q4H Lytle Butte, MD   4 mg at 07/13/14 1306  . pantoprazole (PROTONIX) EC tablet 40 mg  40 mg Oral  Daily Bettey Costa, MD   40 mg at 07/13/14 0939  . senna-docusate (Senokot-S) tablet 1 tablet  1 tablet Oral QHS PRN Bettey Costa, MD      . sodium bicarbonate 100 mEq in dextrose 5 % 1,000 mL infusion   Intravenous Continuous Vaughan Basta, MD      . sodium chloride 0.9 % injection 3 mL  3 mL Intravenous Q12H Sital Mody, MD   3 mL at 07/13/14 1000  . thiamine (VITAMIN B-1) tablet 100 mg  100 mg Oral Daily Harvest Dark, MD   100 mg at 07/13/14 8341      Allergies: Allergies  Allergen Reactions  . Aspirin Swelling      Past Medical History: Past Medical History  Diagnosis Date  . Hernia of abdominal cavity      Past Surgical History: History reviewed. No pertinent past surgical history.   Family History: Mother alive has history of hypertension, father also alive and has hypertension.   Social History: Lives at home, divorced, 2 children, works at [employer] as a Training and development officer.  Uses tobacco products, drinks 6 alcoholic beverages daily.   Review of Systems: Review of Systems  Constitutional: Positive for malaise/fatigue. Negative for fever, chills, weight loss and diaphoresis.  HENT: Negative for ear pain, hearing loss and tinnitus.   Eyes: Negative for blurred vision and double vision.  Respiratory: Negative for cough, hemoptysis and sputum production.   Cardiovascular: Negative for chest pain, palpitations and orthopnea.  Gastrointestinal: Positive for heartburn, nausea, vomiting, abdominal pain and diarrhea.  Genitourinary: Negative for dysuria, urgency and frequency.  Musculoskeletal: Negative for myalgias, back pain and neck pain.  Skin: Negative for itching and rash.  Neurological: Negative for dizziness, tingling, tremors, weakness and headaches.  Endo/Heme/Allergies: Negative for environmental allergies and polydipsia. Does not bruise/bleed easily.  Psychiatric/Behavioral: Negative for depression. The patient is nervous/anxious.     Vital Signs: Blood pressure  141/77, pulse 119, temperature 97.7 F (36.5 C), temperature source Oral, resp. rate 20, height 5\' 2"  (1.575 m), weight 59.875 kg (132 lb), SpO2 97 %.  Weight trends: Filed Weights   07/12/14 0846  Weight: 59.875 kg (132 lb)    Physical Exam: General: NAD, slender  Head: Normocephalic, atraumatic.  Eyes: Anicteric, EOMI, PERRL  Nose: Mucous membranes moist, not inflammed  Throat: Oropharynx nonerythematous, no exudate appreciated.   Neck: No deformities, masses, or tenderness noted.Supple, No carotid Bruits, no JVD.  Lungs:  Normal respiratory effort. Clear to auscultation BL without crackles or wheezes.  Heart: RRR. S1 and S2 normal without gallop, murmur, or rubs.  Abdomen:  BS normoactive. Soft, mild distension, non-tender.   Extremities: No pretibial edema.  Neurologic: A&O X3, strength 5/5 in both upper and lower extremeties, mild tremor both hands  Skin: No visible rashes, scars.    Lab results: Basic Metabolic Panel:  Recent Labs Lab 07/12/14 0909 07/13/14 0446  NA 136 132*  K 4.2 4.2  CL 96* 104  CO2 10* 11*  GLUCOSE 112* 99  BUN 10 5*  CREATININE 0.95 0.75  CALCIUM 9.6 8.6*    Liver Function Tests: No results for input(s): AST, ALT, ALKPHOS, BILITOT, PROT, ALBUMIN in the last 168 hours.  Recent Labs Lab 07/12/14 0909  LIPASE 141*   No results for input(s): AMMONIA in the last 168 hours.  CBC:  Recent Labs Lab 07/12/14 0909 07/13/14 0446  WBC 9.1 7.4  NEUTROABS 7.1*  --   HGB 15.8 13.3  HCT 47.3* 40.7  MCV 112.2* 113.1*  PLT 164 122*    Cardiac Enzymes: No results for input(s): CKTOTAL, CKMB, CKMBINDEX, TROPONINI in the last 168 hours.  BNP: Invalid input(s): POCBNP  CBG:  Recent Labs Lab 07/12/14 0847  GLUCAP 112*    Microbiology: No results found for this or any previous visit.  Coagulation Studies: No results for input(s): LABPROT, INR in the last 72 hours.  Urinalysis: No results for input(s): COLORURINE, LABSPEC,  PHURINE, GLUCOSEU, HGBUR, BILIRUBINUR, KETONESUR, PROTEINUR, UROBILINOGEN, NITRITE, LEUKOCYTESUR in the last 72 hours.  Invalid input(s): APPERANCEUR    Imaging:  No results found.   Assessment & Plan: Pt is a 54 y.o. yo female with a PMHX of abdominal hernia and alcohol abuse, was admitted to [facility] on 07/12/2014 with nausea, vomiting in the setting of alcohol abuse.   1.  Anion gap metabolic acidosis. 2.  Alcohol abuse.  3.  Thrombocytopenia.  Plan:  We suspect that the patient has acidosis related to alcohol abuse as well as starvation ketosis. The patient specifically denied ingestion of isopropyl alcohol, ethylene glycol, or any other occult ingestions. We will proceed with changing bicarbonate drip to 178meq and give this at 125cc/hr.  Diet is being reintroduced as well.  Agree with watching for DTs as well.  Work up of thrombocytopenia per hospitalist, but pt could have hypersplenism from ETOH abuse and portal hypertension.  Thanks for consult, further plan as patient progresses.  @PROBHOSP @

## 2014-07-13 NOTE — Progress Notes (Signed)
Kathy [name] at Kathy [name] NAME: Kathy [name]    MR#:  [id number]  DATE OF BIRTH:  08/24/1960  SUBJECTIVE:  CHIEF COMPLAINT:   Chief Complaint  Patient presents with  . Vomiting  . Dehydration    REVIEW OF SYSTEMS:  CONSTITUTIONAL: No fever,positive for fatigue , weakness.  EYES: No blurred or double vision.  EARS, NOSE, AND THROAT: No tinnitus or ear pain.  RESPIRATORY: No cough, shortness of breath, wheezing or hemoptysis.  CARDIOVASCULAR: No chest pain, orthopnea, edema.  GASTROINTESTINAL: she had nausea, vomiting, no diarrhea or abdominal pain.  GENITOURINARY: No dysuria, hematuria.  ENDOCRINE: No polyuria, nocturia,  HEMATOLOGY: No anemia, easy bruising or bleeding SKIN: No rash or lesion. MUSCULOSKELETAL: No joint pain or arthritis.   NEUROLOGIC: No tingling, numbness, weakness.  PSYCHIATRY: No anxiety or depression.   ROS  DRUG ALLERGIES:   Allergies  Allergen Reactions  . Aspirin Swelling    VITALS:  Blood pressure 141/77, pulse 119, temperature 97.7 F (36.5 C), temperature source Oral, resp. rate 20, height 5\' 2"  (1.575 m), weight 59.875 kg (132 lb), SpO2 97 %.  PHYSICAL EXAMINATION:  GENERAL:  54 y.o.-year-old patient lying in the bed with no acute distress. Thin. EYES: Pupils equal, round, reactive to light and accommodation. No scleral icterus. Extraocular muscles intact.  HEENT: Head atraumatic, normocephalic. Oropharynx and nasopharynx clear.  NECK:  Supple, no jugular venous distention. No thyroid enlargement, no tenderness.  LUNGS: Normal breath sounds bilaterally, no wheezing, rales,rhonchi or crepitation. No use of accessory muscles of respiration.  CARDIOVASCULAR: S1, S2 normal. No murmurs, rubs, or gallops.  ABDOMEN: Soft, nontender, nondistended. Bowel sounds present. No organomegaly or mass.  EXTREMITIES: No pedal edema, cyanosis, or clubbing.  NEUROLOGIC: Cranial nerves II through XII are intact.  Muscle strength 5/5 in all extremities. Sensation intact. Gait not checked.     Mild tremor present. PSYCHIATRIC: The patient is alert and oriented x 3.  SKIN: No obvious rash, lesion, or ulcer.   Physical Exam LABORATORY PANEL:   CBC  Recent Labs Lab 07/13/14 0446  WBC 7.4  HGB 13.3  HCT 40.7  PLT 122*   ------------------------------------------------------------------------------------------------------------------  Chemistries   Recent Labs Lab 07/13/14 0446  NA 132*  K 4.2  CL 104  CO2 11*  GLUCOSE 99  BUN 5*  CREATININE 0.75  CALCIUM 8.6*   ------------------------------------------------------------------------------------------------------------------   ASSESSMENT AND PLAN:   * metabolic acidosis   Due to Alcohol and starvation   IV fluids, and Bicarb drip.    Appreciated nephrology consult.   Councelled pt for quiting alcohol, her daughter present in room- agreed.   Called psych consult.  * Alcohol dependence   She had high level of blood alcohol on admision, on CIWA protocol.   Psych to help.  * thrombocytopenia    Due to alcohol abuse.  * nausea and vomit   Due to alcohol abuse- likely alcohol gastritis or due to acidosis.  * Smoking   Councelled to quit for 4 min, Nicotrol inhaler offered.  * Depression   Continue lexapro.  All the records are reviewed and case discussed with Care Management/Social Workerr. Management plans discussed with the patient, family and they are in agreement.  CODE STATUS: full  TOTAL TIME TAKING CARE OF THIS PATIENT: 40 minutes.   POSSIBLE D/C IN 1-2 DAYS, DEPENDING ON CLINICAL CONDITION.   Vaughan Basta M.D on 07/13/2014   Between 7am to 6pm - Pager - [phone]  After  6pm go to [email or web address] - password [email or web address] [facility]  Office  [phone]  CC: Primary care physician; Dion Body, MD

## 2014-07-13 NOTE — Consult Note (Signed)
[facility] Face-to-Face Psychiatry Consult   Reason for Consult:  Consult for this 54 year old woman with alcohol abuse with dependence. Evaluate withdrawal. Also mood. Referring Physician:  Silvano Bilis  Patient Identification: Kathy [name] MRN:  [id number] Principal Diagnosis: alcohol withdrawl Diagnosis:   Patient Active Problem List   Diagnosis Date Noted  . Acidosis [E87.2] 07/12/2014    Total Time spent with patient: 1 hour  Subjective:   Kathy [name] is a 54 y.o. female patient admitted with patient was admitted with alcohol withdrawal and alcohol-induced acidosis.[name]  HPI:  Information obtained from the patient from the chart and from her parents and daughter who were also in the room at the patient's consent. Patient was admitted to the hospital in part for intentional detox from alcohol. She has a ventral hernia that needs repair and has been told by her doctor that she must stop drinking and smoking before they will operate on her. Patient reports that she drinks the equivalent of about 6 beers a day and has been doing so for about 25 years. She claims that she's had up to 2 weeks of sobriety in the past. She has been to a rehabilitation on at least one occasion but has never been engaged in any kind of formal substance abuse treatment. She denies that she abuses any other drugs. Patient admits that the alcohol causes significant problems for her. Effexor relationship with her family. Effexor ability to hold down a full-time job. Affects her health and causes her to have mood problems. She says she is motivated to stop drinking. Patient also smokes about a half a pack of cigarettes a day and is trying to plan on stopping smoking as well.  Denies any past psychiatric history but has been prescribed an antidepressant apparently by her primary care doctor. No psychiatric hospitalization no history of suicide attempts.  Social history: Patient lives with her 2 elderly parents. She works  part time. She has 2 adult children who are very concerned about her well-being  Medical history is that she has an acute or subacute ventral hernia that she would like to have repaired. Currently has acidosis that is being addressed in the hospital. History of gastric reflux.  Family history is that she had a grandfather who was an alcoholic  Current medications: Patient says she takes omeprazole. Can remember any other medicines that she is taking currently.  Substance abuse history: Alcohol as above. Denies that she's ever had an abuse problem with any other drugs. Patient denies ever having had a seizure or delirium tremens HPI Elements:   Quality:  Alcohol abuse and now mild nausea with some vomiting some fatigue and confusion related to alcohol withdrawal. Severity:  Moderate. Timing:  Has been happening just since she is stopped drinking a couple days ago although her problems with alcohol obviously go back years. Duration:  So far just a couple days. Context:  Attempts to stop drinking because of her physical health and the encouragement of her family.  Past Medical History:  Past Medical History  Diagnosis Date  . Hernia of abdominal cavity    History reviewed. No pertinent past surgical history. Family History: History reviewed. No pertinent family history. Social History:  History  Alcohol Use No     History  Drug Use No    History   Social History  . Marital Status: Divorced    Spouse Name: N/A  . Number of Children: N/A  . Years of Education: N/A   Social History  Main Topics  . Smoking status: Current Every Day Smoker -- 0.50 packs/day    Types: Cigarettes  . Smokeless tobacco: Never Used  . Alcohol Use: No  . Drug Use: No  . Sexual Activity: Not on file   Other Topics Concern  . None   Social History Narrative  . None   Additional Social History:                          Allergies:   Allergies  Allergen Reactions  . Aspirin Swelling     Labs:  Results for orders placed or performed during the hospital encounter of 07/12/14 (from the past 48 hour(s))  Glucose, capillary     Status: Abnormal   Collection Time: 07/12/14  8:47 AM  Result Value Ref Range   Glucose-Capillary 112 (H) 65 - 99 mg/dL  Acetaminophen level     Status: Abnormal   Collection Time: 07/12/14  9:03 AM  Result Value Ref Range   Acetaminophen (Tylenol), Serum <10 (L) 10 - 30 ug/mL    Comment:        THERAPEUTIC CONCENTRATIONS VARY SIGNIFICANTLY. A RANGE OF 10-30 ug/mL MAY BE AN EFFECTIVE CONCENTRATION FOR MANY PATIENTS. HOWEVER, SOME ARE BEST TREATED AT CONCENTRATIONS OUTSIDE THIS RANGE. ACETAMINOPHEN CONCENTRATIONS >150 ug/mL AT 4 HOURS AFTER INGESTION AND >50 ug/mL AT 12 HOURS AFTER INGESTION ARE OFTEN ASSOCIATED WITH TOXIC REACTIONS.   Salicylate level     Status: None   Collection Time: 07/12/14  9:03 AM  Result Value Ref Range   Salicylate Lvl <9.6 2.8 - 30.0 mg/dL  Ethanol     Status: Abnormal   Collection Time: 07/12/14  9:03 AM  Result Value Ref Range   Alcohol, Ethyl (B) 221 (H) <5 mg/dL    Comment:        LOWEST DETECTABLE LIMIT FOR SERUM ALCOHOL IS 11 mg/dL FOR MEDICAL PURPOSES ONLY   CBC with Differential/Platelet     Status: Abnormal   Collection Time: 07/12/14  9:09 AM  Result Value Ref Range   WBC 9.1 3.6 - 11.0 K/uL   RBC 4.21 3.80 - 5.20 MIL/uL   Hemoglobin 15.8 12.0 - 16.0 g/dL   HCT 47.3 (H) 35.0 - 47.0 %   MCV 112.2 (H) 80.0 - 100.0 fL   MCH 37.6 (H) 26.0 - 34.0 pg   MCHC 33.5 32.0 - 36.0 g/dL   RDW 14.6 (H) 11.5 - 14.5 %   Platelets 164 150 - 440 K/uL   Neutrophils Relative % 79 %   Neutro Abs 7.1 (H) 1.4 - 6.5 K/uL   Lymphocytes Relative 14 %   Lymphs Abs 1.3 1.0 - 3.6 K/uL   Monocytes Relative 6 %   Monocytes Absolute 0.6 0.2 - 0.9 K/uL   Eosinophils Relative 0 %   Eosinophils Absolute 0.0 0 - 0.7 K/uL   Basophils Relative 1 %   Basophils Absolute 0.1 0 - 0.1 K/uL  Basic metabolic panel      Status: Abnormal   Collection Time: 07/12/14  9:09 AM  Result Value Ref Range   Sodium 136 135 - 145 mmol/L   Potassium 4.2 3.5 - 5.1 mmol/L   Chloride 96 (L) 101 - 111 mmol/L   CO2 10 (L) 22 - 32 mmol/L   Glucose, Bld 112 (H) 65 - 99 mg/dL   BUN 10 6 - 20 mg/dL   Creatinine, Ser 0.95 0.44 - 1.00 mg/dL   Calcium 9.6 8.9 -  10.3 mg/dL   GFR calc non Af Amer >60 >60 mL/min   GFR calc Af Amer >60 >60 mL/min    Comment: (NOTE) The eGFR has been calculated using the CKD EPI equation. This calculation has not been validated in all clinical situations. eGFR's persistently <60 mL/min signify possible Chronic Kidney Disease.    Anion gap 30 (H) 5 - 15  Lipase, blood     Status: Abnormal   Collection Time: 07/12/14  9:09 AM  Result Value Ref Range   Lipase 141 (H) 22 - 51 U/L  Blood gas, venous     Status: Abnormal   Collection Time: 07/12/14 12:00 PM  Result Value Ref Range   pH, Ven 7.03 (LL) 7.320 - 7.430   pCO2, Ven 32 (L) 44.0 - 60.0 mmHg   Patient temperature 37.0    Sample type VENOUS   Lactic acid, plasma     Status: None   Collection Time: 07/12/14  1:11 PM  Result Value Ref Range   Lactic Acid, Venous 1.7 0.5 - 2.0 mmol/L  Lactic acid, plasma     Status: None   Collection Time: 07/12/14  1:30 PM  Result Value Ref Range   Lactic Acid, Venous 1.8 0.5 - 2.0 mmol/L  Basic metabolic panel     Status: Abnormal   Collection Time: 07/13/14  4:46 AM  Result Value Ref Range   Sodium 132 (L) 135 - 145 mmol/L   Potassium 4.2 3.5 - 5.1 mmol/L   Chloride 104 101 - 111 mmol/L   CO2 11 (L) 22 - 32 mmol/L   Glucose, Bld 99 65 - 99 mg/dL   BUN 5 (L) 6 - 20 mg/dL   Creatinine, Ser 0.75 0.44 - 1.00 mg/dL   Calcium 8.6 (L) 8.9 - 10.3 mg/dL   GFR calc non Af Amer >60 >60 mL/min   GFR calc Af Amer >60 >60 mL/min    Comment: (NOTE) The eGFR has been calculated using the CKD EPI equation. This calculation has not been validated in all clinical situations. eGFR's persistently <60 mL/min  signify possible Chronic Kidney Disease.    Anion gap 17 (H) 5 - 15  CBC     Status: Abnormal   Collection Time: 07/13/14  4:46 AM  Result Value Ref Range   WBC 7.4 3.6 - 11.0 K/uL   RBC 3.60 (L) 3.80 - 5.20 MIL/uL   Hemoglobin 13.3 12.0 - 16.0 g/dL   HCT 40.7 35.0 - 47.0 %   MCV 113.1 (H) 80.0 - 100.0 fL   MCH 37.0 (H) 26.0 - 34.0 pg   MCHC 32.7 32.0 - 36.0 g/dL   RDW 14.7 (H) 11.5 - 14.5 %   Platelets 122 (L) 150 - 440 K/uL  Blood gas, arterial     Status: Abnormal   Collection Time: 07/13/14 11:08 AM  Result Value Ref Range   FIO2 21.00 %   pH, Arterial 7.20 (L) 7.350 - 7.450    Comment: CRITICAL RESULT CALLED TO, READ BACK BY AND VERIFIED WITH:  PH, HC03, BE, KIM BUNBREN RN [id number] 07/13/14    pCO2 arterial 21 (L) 32.0 - 48.0 mmHg   pO2, Arterial 102 83.0 - 108.0 mmHg   Bicarbonate 8.2 (L) 21.0 - 28.0 mEq/L   Acid-base deficit 17.8 (H) 0.0 - 2.0 mmol/L   O2 Saturation 96.3 %   Patient temperature 37.0    Collection site REVIEWED BY    Sample type ARTERIAL DRAW    Allens test (pass/fail) PASS  PASS    Vitals: Blood pressure 154/95, pulse 115, temperature 97.5 F (36.4 C), temperature source Oral, resp. rate 2, height '5\' 2"'  (1.575 m), weight 59.875 kg (132 lb), SpO2 100 %.  Risk to Self: Is patient at risk for suicide?: No Risk to Others:   Prior Inpatient Therapy:   Prior Outpatient Therapy:    Current Facility-Administered Medications  Medication Dose Route Frequency Provider Last Rate Last Dose  . acetaminophen (TYLENOL) tablet 650 mg  650 mg Oral Q6H PRN Bettey Costa, MD   650 mg at 07/13/14 1614   Or  . acetaminophen (TYLENOL) suppository 650 mg  650 mg Rectal Q6H PRN Bettey Costa, MD      . alum & mag hydroxide-simeth (MAALOX/MYLANTA) 200-200-20 MG/5ML suspension 30 mL  30 mL Oral Q6H PRN Bettey Costa, MD      . chlordiazePOXIDE (LIBRIUM) capsule 25 mg  25 mg Oral TID Bettey Costa, MD   25 mg at 07/13/14 1600  . escitalopram (LEXAPRO) tablet 10 mg  10 mg Oral Daily Bettey Costa, MD   10 mg at 07/13/14 0939  . folic acid (FOLVITE) tablet 1 mg  1 mg Oral Daily Sital Mody, MD   1 mg at 07/13/14 0939  . heparin injection 5,000 Units  5,000 Units Subcutaneous 3 times per day Bettey Costa, MD   5,000 Units at 07/13/14 1400  . LORazepam (ATIVAN) injection 0-4 mg  0-4 mg Intravenous 4 times per day Harvest Dark, MD   0 mg at 07/12/14 1445  . [START ON 07/14/2014] LORazepam (ATIVAN) injection 0-4 mg  0-4 mg Intravenous Q12H Sital Mody, MD      . LORazepam (ATIVAN) tablet 1 mg  1 mg Oral Q6H PRN Bettey Costa, MD       Or  . LORazepam (ATIVAN) injection 1 mg  1 mg Intravenous Q6H PRN Sital Mody, MD      . LORazepam (ATIVAN) tablet 0-4 mg  0-4 mg Oral 4 times per day Harvest Dark, MD   1 mg at 07/13/14 0005  . [START ON 07/14/2014] LORazepam (ATIVAN) tablet 0-4 mg  0-4 mg Oral Q12H Sital Mody, MD      . multivitamin with minerals tablet 1 tablet  1 tablet Oral Daily Bettey Costa, MD   1 tablet at 07/13/14 0939  . nicotine (NICOTROL) 10 MG inhaler 1 continuous puffing  1 continuous puffing Inhalation PRN Vaughan Basta, MD      . ondansetron (ZOFRAN) injection 4 mg  4 mg Intravenous Q4H Lytle Butte, MD   4 mg at 07/13/14 0636   Or  . ondansetron (ZOFRAN) tablet 4 mg  4 mg Oral Q4H Lytle Butte, MD   4 mg at 07/13/14 1600  . pantoprazole (PROTONIX) EC tablet 40 mg  40 mg Oral Daily Bettey Costa, MD   40 mg at 07/13/14 0939  . senna-docusate (Senokot-S) tablet 1 tablet  1 tablet Oral QHS PRN Bettey Costa, MD      . sodium bicarbonate 150 mEq in dextrose 5 % 1,000 mL infusion   Intravenous Continuous Munsoor Lateef, MD 125 mL/hr at 07/13/14 1600    . sodium chloride 0.9 % injection 3 mL  3 mL Intravenous Q12H Sital Mody, MD   3 mL at 07/13/14 1000  . thiamine (VITAMIN B-1) tablet 100 mg  100 mg Oral Daily Harvest Dark, MD   100 mg at 07/13/14 9622    Musculoskeletal: Strength & Muscle Tone: decreased and atrophy Gait & Station:  unsteady Patient leans:  N/A  Psychiatric Specialty Exam: Physical Exam  Constitutional: She is oriented to person, place, and time. She appears lethargic. She appears cachectic. She is cooperative.  HENT:  Head: Normocephalic and atraumatic.  Eyes: Conjunctivae are normal. Pupils are equal, round, and reactive to light.  Neck: Normal range of motion.  Cardiovascular: Normal heart sounds.   Respiratory: Effort normal.  GI: Soft.  Musculoskeletal: Normal range of motion.  Neurological: She is oriented to person, place, and time. She appears lethargic.  Skin: Skin is warm and dry.  Psychiatric: Judgment and thought content normal. Her affect is blunt. Her speech is delayed. She is slowed. Cognition and memory are impaired. She exhibits abnormal recent memory and abnormal remote memory.    Review of Systems  Constitutional: Positive for weight loss and malaise/fatigue.  HENT: Negative.   Eyes: Negative.   Respiratory: Positive for cough.   Cardiovascular: Negative.   Gastrointestinal: Negative.   Musculoskeletal: Negative.   Skin: Negative.   Neurological: Positive for tremors.  Psychiatric/Behavioral: Positive for memory loss and substance abuse. Negative for depression, suicidal ideas and hallucinations. The patient is nervous/anxious. The patient does not have insomnia.     Blood pressure 154/95, pulse 115, temperature 97.5 F (36.4 C), temperature source Oral, resp. rate 2, height '5\' 2"'  (1.575 m), weight 59.875 kg (132 lb), SpO2 100 %.Body mass index is 24.14 kg/(m^2).  General Appearance: Disheveled  Eye Sport and exercise psychologist::  Fair  Speech:  Slow  Volume:  Decreased  Mood:  Anxious  Affect:  Flat  Thought Process:  Goal Directed, Tangential and Slow  Orientation:  Other:  Patient was oriented to place and situation. Notably she declare the year to be 1996 and did not correct herself  Thought Content:  Negative  Suicidal Thoughts:  No  Homicidal Thoughts:  No  Memory:  Immediate;   Fair Recent;    Fair Remote;   Fair  Judgement:  Intact  Insight:  Fair  Psychomotor Activity:  Decreased  Concentration:  Fair  Recall:  [employer] of Knowledge:Fair  Language: Fair  Akathisia:  No  Handed:  Right  AIMS (if indicated):     Assets:  Communication Skills Desire for Improvement Financial Resources/Insurance Housing Intimacy Social Support Vocational/Educational  ADL's:  Intact  Cognition: Impaired,  Mild  Sleep:      Medical Decision Making: New problem, with additional work up planned, Review of Psycho-Social Stressors (1), Review or order clinical lab tests (1), Discuss test with performing physician (1), Review and summation of old records (2) and Review of Medication Regimen & Side Effects (2)  Treatment Plan Summary: Plan Patient is in the hospital for alcohol withdrawal. She presented with a blood alcohol level of 221 yesterday morning. Her blood pressure and pulse are elevated but she is not showing any signs of tremor and has not had a seizure. On mental status she is a little bit slowed and at times seems slightly confused but is not delirious. 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 So far she seems to be.detoxing fine. Orders are in place for appropriate detox protocol. Psychoeducation completed with the patient and the family describing the effects of alcohol withdrawal, the effects of alcohol on the body and brain and discussing plans for longer-term sobriety. It looks like social work is already given them some information about outpatient treatment options in the community and the family are very onboard with helping her. No change to  medication ordered now. We'll follow as needed.  Plan:  No evidence of imminent risk to self or others at present.   Patient does not meet criteria for psychiatric inpatient admission. Refer to IOP. Disposition: Will follow as she is in the hospital. Continue supportive and psychoeducation  therapy. Will check and make sure her medicines are adequate. I don't think she will need psychiatric hospitalization and I suspect she will be able to be referred to outpatient treatment  Weber [facility]s [facility] 07/13/2014 5:31 PM

## 2014-07-14 DIAGNOSIS — F1024 Alcohol dependence with alcohol-induced mood disorder: Secondary | ICD-10-CM | POA: Insufficient documentation

## 2014-07-14 LAB — RENAL FUNCTION PANEL
Albumin: 3.8 g/dL (ref 3.5–5.0)
Albumin: 3.8 g/dL (ref 3.5–5.0)
Anion gap: 11 (ref 5–15)
Anion gap: 10 (ref 5–15)
BUN: <5 mg/dL — ABNORMAL LOW (ref 6–20)
BUN: <5 mg/dL — ABNORMAL LOW (ref 6–20)
CO2: 25 mmol/L (ref 22–32)
CO2: 26 mmol/L (ref 22–32)
Calcium: 8.9 mg/dL (ref 8.9–10.3)
Calcium: 9 mg/dL (ref 8.9–10.3)
Chloride: 88 mmol/L — ABNORMAL LOW (ref 101–111)
Chloride: 92 mmol/L — ABNORMAL LOW (ref 101–111)
Creatinine, Ser: 0.59 mg/dL (ref 0.44–1.00)
Creatinine, Ser: 0.61 mg/dL (ref 0.44–1.00)
GFR calc Af Amer: >60 mL/min (ref >60)
GFR calc Af Amer: >60 mL/min (ref >60)
GFR calc non Af Amer: >60 mL/min (ref >60)
GFR calc non Af Amer: >60 mL/min (ref >60)
Glucose, Bld: 143 mg/dL — ABNORMAL HIGH (ref 65–99)
Glucose, Bld: 173 mg/dL — ABNORMAL HIGH (ref 65–99)
Phosphorus: <1 mg/dL — CL (ref 2.5–4.6)
Phosphorus: <1 mg/dL — CL (ref 2.5–4.6)
Potassium: 3.1 mmol/L — ABNORMAL LOW (ref 3.5–5.1)
Potassium: 3.1 mmol/L — ABNORMAL LOW (ref 3.5–5.1)
Sodium: 124 mmol/L — ABNORMAL LOW (ref 135–145)
Sodium: 128 mmol/L — ABNORMAL LOW (ref 135–145)

## 2014-07-14 LAB — BASIC METABOLIC PANEL
Anion gap: 11 (ref 5–15)
BUN: <5 mg/dL — ABNORMAL LOW (ref 6–20)
CO2: 26 mmol/L (ref 22–32)
Calcium: 9 mg/dL (ref 8.9–10.3)
Chloride: 89 mmol/L — ABNORMAL LOW (ref 101–111)
Creatinine, Ser: 0.53 mg/dL (ref 0.44–1.00)
GFR calc Af Amer: >60 mL/min (ref >60)
GFR calc non Af Amer: >60 mL/min (ref >60)
Glucose, Bld: 195 mg/dL — ABNORMAL HIGH (ref 65–99)
Potassium: 2.7 mmol/L — CL (ref 3.5–5.1)
Sodium: 126 mmol/L — ABNORMAL LOW (ref 135–145)

## 2014-07-14 LAB — BLOOD GAS, ARTERIAL
Acid-Base Excess: 3.6 mmol/L — ABNORMAL HIGH (ref 0.0–3.0)
Acid-Base Excess: 4.3 mmol/L — ABNORMAL HIGH (ref 0.0–3.0)
Allens test (pass/fail): POSITIVE — AB
Bicarbonate: 26.5 mEq/L (ref 21.0–28.0)
Bicarbonate: 26.9 mEq/L (ref 21.0–28.0)
FIO2: 0.21 %
FIO2: 21 %
O2 Saturation: 96.4 %
O2 Saturation: 95.4 %
Patient temperature: 37
Patient temperature: 37
pCO2 arterial: 34 mmHg (ref 32.0–48.0)
pCO2 arterial: 33 mmHg (ref 32.0–48.0)
pH, Arterial: 7.5 — ABNORMAL HIGH (ref 7.350–7.450)
pH, Arterial: 7.52 — ABNORMAL HIGH (ref 7.350–7.450)
pO2, Arterial: 77 mmHg — ABNORMAL LOW (ref 83.0–108.0)
pO2, Arterial: 69 mmHg — ABNORMAL LOW (ref 83.0–108.0)

## 2014-07-14 LAB — MAGNESIUM: Magnesium: 1.3 mg/dL — ABNORMAL LOW (ref 1.7–2.4)

## 2014-07-14 LAB — POTASSIUM: Potassium: 3.1 mmol/L — ABNORMAL LOW (ref 3.5–5.1)

## 2014-07-14 MED ORDER — K PHOS MONO-SOD PHOS DI & MONO 155-852-130 MG PO TABS
500.0000 mg | ORAL_TABLET | Freq: Three times a day (TID) | ORAL | Status: DC
  Administered 2014-07-14 – 2014-07-16 (×5): 500 mg via ORAL
  Filled 2014-07-14 (×8): qty 2

## 2014-07-14 MED ORDER — K PHOS MONO-SOD PHOS DI & MONO 155-852-130 MG PO TABS
500.0000 mg | ORAL_TABLET | Freq: Two times a day (BID) | ORAL | Status: DC
  Administered 2014-07-14: 500 mg via ORAL
  Filled 2014-07-14 (×2): qty 2

## 2014-07-14 MED ORDER — POTASSIUM CHLORIDE IN NACL 40-0.9 MEQ/L-% IV SOLN
INTRAVENOUS | Status: AC
  Administered 2014-07-14: 50 mL/h via INTRAVENOUS
  Filled 2014-07-14: qty 1000

## 2014-07-14 MED ORDER — POTASSIUM CHLORIDE CRYS ER 20 MEQ PO TBCR
40.0000 meq | EXTENDED_RELEASE_TABLET | Freq: Two times a day (BID) | ORAL | Status: AC
  Administered 2014-07-14 – 2014-07-15 (×3): 40 meq via ORAL
  Filled 2014-07-14 (×3): qty 2

## 2014-07-14 MED ORDER — MAGNESIUM SULFATE 2 GM/50ML IV SOLN
2.0000 g | Freq: Once | INTRAVENOUS | Status: AC
  Administered 2014-07-14: 2 g via INTRAVENOUS
  Filled 2014-07-14: qty 50

## 2014-07-14 NOTE — Progress Notes (Signed)
Notified Dr. Marthann Schiller that pt has a critical phosphorous level of Less than 1.0, via telephone, MD acknowledged.

## 2014-07-14 NOTE — Progress Notes (Signed)
[place] [facility] [place] NAME: Kathy [name]    MR#:  [id number]  DATE OF BIRTH:  Apr 01, 1960  SUBJECTIVE:  CHIEF COMPLAINT:   Chief Complaint  Patient presents with  . Vomiting  . Dehydration   Feels better today, still not eating much- no anxiety or withdrawal signs.  REVIEW OF SYSTEMS:  CONSTITUTIONAL: No fever,positive for fatigue , weakness.  EYES: No blurred or double vision.  EARS, NOSE, AND THROAT: No tinnitus or ear pain.  RESPIRATORY: No cough, shortness of breath, wheezing or hemoptysis.  CARDIOVASCULAR: No chest pain, orthopnea, edema.  GASTROINTESTINAL: she had nausea, vomiting, no diarrhea or abdominal pain.  GENITOURINARY: No dysuria, hematuria.  ENDOCRINE: No polyuria, nocturia,  HEMATOLOGY: No anemia, easy bruising or bleeding SKIN: No rash or lesion. MUSCULOSKELETAL: No joint pain or arthritis.   NEUROLOGIC: No tingling, numbness, weakness.  PSYCHIATRY: No anxiety or depression.   ROS  DRUG ALLERGIES:   Allergies  Allergen Reactions  . Aspirin Swelling    VITALS:  Blood pressure 141/83, pulse 126, temperature 98.3 F (36.8 C), temperature source Oral, resp. rate 19, height 5\' 2"  (1.575 m), weight 59.875 kg (132 lb), SpO2 100 %.  PHYSICAL EXAMINATION:  GENERAL:  54 y.o.-year-old patient lying in the bed with no acute distress. Thin. EYES: Pupils equal, round, reactive to light and accommodation. No scleral icterus. Extraocular muscles intact.  HEENT: Head atraumatic, normocephalic. Oropharynx and nasopharynx clear.  NECK:  Supple, no jugular venous distention. No thyroid enlargement, no tenderness.  LUNGS: Normal breath sounds bilaterally, no wheezing, rales,rhonchi or crepitation. No use of accessory muscles of respiration.  CARDIOVASCULAR: S1, S2 normal. No murmurs, rubs, or gallops.  ABDOMEN: Soft, nontender, nondistended. Bowel sounds present. No organomegaly or mass.  EXTREMITIES: No pedal edema,  cyanosis, or clubbing.  NEUROLOGIC: Cranial nerves II through XII are intact. Muscle strength 5/5 in all extremities. Sensation intact. Gait not checked.     No tremor present. PSYCHIATRIC: The patient is alert and oriented x 3.  SKIN: No obvious rash, lesion, or ulcer.   Physical Exam LABORATORY PANEL:   CBC  Recent Labs Lab 07/13/14 0446  WBC 7.4  HGB 13.3  HCT 40.7  PLT 122*   ------------------------------------------------------------------------------------------------------------------  Chemistries   Recent Labs Lab 07/14/14 0624 07/14/14 1326  NA  --  124*  K  --  3.1*  3.1*  CL  --  88*  CO2  --  25  GLUCOSE  --  143*  BUN  --  <5*  CREATININE  --  0.59  CALCIUM  --  8.9  MG 1.3*  --    ------------------------------------------------------------------------------------------------------------------   ASSESSMENT AND PLAN:   * metabolic acidosis   Due to Alcohol and starvation   IV fluids, and Bicarb drip.    Appreciated nephrology consult.   Councelled pt for quiting alcohol, her daughter present in room- agreed.   Called psych consult.   Now with Bicarb drip- over corrected- so stopped and started back on NS.    Monitor.  * Alcohol dependence   She had high level of blood alcohol on admision, on CIWA protocol.   Psych to help.   No signs of withdrawal. On librium and Ativan PRN.  * hypokalemia, Hypomagnesemia, Hypophosphatemeia   Replacing- recheck.   - reason is chronic heavy alcohol use.  * thrombocytopenia    Due to alcohol abuse.no active bleed.  * nausea and vomit   Due to alcohol abuse- likely  alcohol gastritis or due to acidosis.  * Smoking   Councelled to quit for 4 min, Nicotrol inhaler offered.  * Depression   Continue lexapro.  All the records are reviewed and case discussed with Care Management/Social Workerr. Management plans discussed with the patient, family and they are in agreement.  CODE STATUS: full  TOTAL TIME  TAKING CARE OF THIS PATIENT: 40 minutes.   POSSIBLE D/C IN 1-2 DAYS, DEPENDING ON CLINICAL CONDITION.   Kathy [name] M.D on 07/14/2014   Between 7am to 6pm - Pager - [phone]  After 6pm go to [email or web address] - password [email or web address] [facility]  Office  [phone]  CC: Primary care physician; Dion Body, MD

## 2014-07-14 NOTE — Progress Notes (Signed)
Notified MD of potassium, phosphorous, and sodium serum, Dr. Abigail Butts acknowledged.

## 2014-07-14 NOTE — Progress Notes (Signed)
[facility]  ROUNDING NOTE   Subjective:   Husband at bedside. Over corrected with potassium of 2.7, sodium of 126 with sodium bicarb gtt. This has been stopped. Started on IV NS with 15mEq K at 80mL/hr.   Objective:  Vital signs in last 24 hours:  Temp:  [97.5 F (36.4 C)-98.7 F (37.1 C)] 98.3 F (36.8 C) (05/21 0700) Pulse Rate:  [115-126] 126 (05/21 0700) Resp:  [2-19] 19 (05/21 0700) BP: (138-154)/(81-95) 141/83 mmHg (05/21 0700) SpO2:  [99 %-100 %] 100 % (05/21 0700)  Weight change:  Filed Weights   07/12/14 0846  Weight: 59.875 kg (132 lb)    Intake/Output: I/O last 3 completed shifts: In: 2913 [P.O.:660; I.V.:1303; IV Piggyback:950] Out: 1050 [Urine:1050]   Intake/Output this shift:     Physical Exam: General: NAD,   Head: Normocephalic, atraumatic. Moist oral mucosal membranes  Eyes: Anicteric, PERRL  Neck: Supple, trachea midline  Lungs:  Clear to auscultation  Heart: Regular rate and rhythm  Abdomen:  Soft, nontender,   Extremities:   peripheral edema.  Neurologic: Nonfocal, moving all four extremities  Skin: No lesions       Basic Metabolic Panel:  Recent Labs Lab 07/12/14 0909 07/13/14 0446 07/14/14 0550 07/14/14 0624  NA 136 132* 126*  --   K 4.2 4.2 2.7*  --   CL 96* 104 89*  --   CO2 10* 11* 26  --   GLUCOSE 112* 99 195*  --   BUN 10 5* <5*  --   CREATININE 0.95 0.75 0.53  --   CALCIUM 9.6 8.6* 9.0  --   MG  --   --   --  1.3*    Liver Function Tests: No results for input(s): AST, ALT, ALKPHOS, BILITOT, PROT, ALBUMIN in the last 168 hours.  Recent Labs Lab 07/12/14 0909  LIPASE 141*   No results for input(s): AMMONIA in the last 168 hours.  CBC:  Recent Labs Lab 07/12/14 0909 07/13/14 0446  WBC 9.1 7.4  NEUTROABS 7.1*  --   HGB 15.8 13.3  HCT 47.3* 40.7  MCV 112.2* 113.1*  PLT 164 122*    Cardiac Enzymes: No results for input(s): CKTOTAL, CKMB, CKMBINDEX, TROPONINI in the last 168  hours.  BNP: Invalid input(s): POCBNP  CBG:  Recent Labs Lab 07/12/14 0847  GLUCAP 112*    Microbiology: No results found for this or any previous visit.  Coagulation Studies: No results for input(s): LABPROT, INR in the last 72 hours.  Urinalysis: No results for input(s): COLORURINE, LABSPEC, PHURINE, GLUCOSEU, HGBUR, BILIRUBINUR, KETONESUR, PROTEINUR, UROBILINOGEN, NITRITE, LEUKOCYTESUR in the last 72 hours.  Invalid input(s): APPERANCEUR    Imaging: No results found.   Medications:   . 0.9 % NaCl with KCl 40 mEq / L 50 mL/hr (07/14/14 1039)   . chlordiazePOXIDE  25 mg Oral TID  . escitalopram  10 mg Oral Daily  . folic acid  1 mg Oral Daily  . heparin  5,000 Units Subcutaneous 3 times per day  . LORazepam  0-4 mg Intravenous Q12H  . LORazepam  0-4 mg Oral Q12H  . magnesium sulfate 1 - 4 g bolus IVPB  2 g Intravenous Once  . multivitamin with minerals  1 tablet Oral Daily  . ondansetron (ZOFRAN) IV  4 mg Intravenous Q4H   Or  . ondansetron  4 mg Oral Q4H  . pantoprazole  40 mg Oral Daily  . potassium chloride  40 mEq Oral BID  .  sodium chloride  3 mL Intravenous Q12H  . thiamine  100 mg Oral Daily   acetaminophen **OR** acetaminophen, alum & mag hydroxide-simeth, LORazepam **OR** LORazepam, nicotine, senna-docusate  Assessment/ Plan:  Kathy [name] is a 54 y.o.  female with abdominal hernia and alcohol abuse, was admitted to [facility] on 07/12/2014 with nausea, vomiting in the setting of alcohol abuse.   1. Anion gap metabolic acidosis. 2. Hyponatremia 3. Hypokalemia 4. Hypomagnesiumia 5. Alcohol abuse.  6. Thrombocytopenia.  Plan: Acidosis corrected, secondary to starvation ketoacidosis and alcohol induced ketoacidosis. Corrected with bicarbonate. But now with hyponatremia and hypokalemia and hypomagnesemia.  - Agree with IV NS and K replacement.  - Will continue to monitor.    LOS: [place], Shyler Holzman 5/21/201612:19 PM

## 2014-07-14 NOTE — Plan of Care (Signed)
Problem: Discharge Progression Outcomes Goal: Tolerating diet Outcome: Progressing Pt is  Alert and oriented x 4, denies pain, c/o mild nausea improved with scheduled zofran, denies emesis, bm throughout shift per pt, poor appetite, continues on room air, up to bathroom independently, CIWA and neuro checks remain negative, no ativan given throughout the day. Sodium, potassium and phosphorus serums remain low, supplement provided. IV fluids with potassium infusing.

## 2014-07-14 NOTE — Progress Notes (Signed)
Patient is alert and oriented, family at bedside. Independent in room with minimal assist. No c/o pain at this time. 0 on the CIWA scale.

## 2014-07-14 NOTE — Progress Notes (Signed)
Dr. Marcille Blanco notified about critical Potassium level of 2.7. MD to put in orders.

## 2014-07-14 NOTE — Progress Notes (Signed)
Dr. Marcille Blanco notified a co-worker hung up the wrong IV bag, Dextrose 5% instead of Dextrose 5% with Bicarb around 0000 for the the providing nurse who was at lunch. This nurse did not scan the bag and hung up the incorrect dose. Notified the arterial blood gas did not come back critical, normal bicarb and C02. MD states continue to monitor.

## 2014-07-14 NOTE — Progress Notes (Signed)
Notify Dr. Abigail Butts via telephone that sodium is 128, potassium is 3.1, and phosphorous remains critical at less than 1.0. MD acknowledged,.

## 2014-07-14 NOTE — Consult Note (Signed)
  Psychiatry: Follow-up for this 54 year old woman with history of alcohol abuse currently detoxing. Patient tells me today she is feeling better than she was yesterday. She says she has been able to get out of bed and ambulate around without feeling dizzy. She is also eating better. She describes her mood as being good. Totally denies suicidal ideation. Patient is alert and oriented today.  On review of systems she minimizes any physical complaints has no new complaints and denies any symptoms of depression.  On mental status exam this remains a disheveled underweight woman looking sickly. Smiling making good eye contact. Affect euthymic mood stated as being good. Thoughts lucid area no obvious delusions. Denies suicidal or homicidal ideation. Pulse remains elevated blood pressure is a little bit elevated. She is eating better.  Patient appears to be detoxing fine without complication. No change to medicine orders required. Supportive and educational counseling completed. Strongly encouraged the patient to get involved with the intensive outpatient program at discharge. Would not immediately suggest sending her to an inpatient program although the family of course is interested in learning more about that.

## 2014-07-15 LAB — MAGNESIUM: Magnesium: 1.6 mg/dL — ABNORMAL LOW (ref 1.7–2.4)

## 2014-07-15 LAB — BASIC METABOLIC PANEL
Anion gap: 7 (ref 5–15)
BUN: <5 mg/dL — ABNORMAL LOW (ref 6–20)
CO2: 27 mmol/L (ref 22–32)
Calcium: 8.7 mg/dL — ABNORMAL LOW (ref 8.9–10.3)
Chloride: 100 mmol/L — ABNORMAL LOW (ref 101–111)
Creatinine, Ser: 0.48 mg/dL (ref 0.44–1.00)
GFR calc Af Amer: >60 mL/min (ref >60)
GFR calc non Af Amer: >60 mL/min (ref >60)
Glucose, Bld: 128 mg/dL — ABNORMAL HIGH (ref 65–99)
Potassium: 3.2 mmol/L — ABNORMAL LOW (ref 3.5–5.1)
Sodium: 134 mmol/L — ABNORMAL LOW (ref 135–145)

## 2014-07-15 LAB — PHOSPHORUS: Phosphorus: <1 mg/dL — CL (ref 2.5–4.6)

## 2014-07-15 LAB — PLATELET COUNT: Platelets: 95 10*3/uL — ABNORMAL LOW (ref 150–440)

## 2014-07-15 MED ORDER — MAGNESIUM SULFATE 2 GM/50ML IV SOLN
2.0000 g | Freq: Once | INTRAVENOUS | Status: AC
Start: 2014-07-15 — End: 2014-07-15
  Administered 2014-07-15: 2 g via INTRAVENOUS
  Filled 2014-07-15: qty 50

## 2014-07-15 MED ORDER — ENSURE ENLIVE PO LIQD
237.0000 mL | Freq: Two times a day (BID) | ORAL | Status: DC
  Administered 2014-07-15 – 2014-07-16 (×2): 237 mL via ORAL

## 2014-07-15 MED ORDER — POTASSIUM PHOSPHATES 15 MMOLE/5ML IV SOLN
30.0000 mmol | Freq: Once | INTRAVENOUS | Status: AC
  Administered 2014-07-15: 30 mmol via INTRAVENOUS
  Filled 2014-07-15: qty 10

## 2014-07-15 MED ORDER — LORAZEPAM 1 MG PO TABS
1.0000 mg | ORAL_TABLET | Freq: Once | ORAL | Status: AC
  Administered 2014-07-15: 1 mg via ORAL
  Filled 2014-07-15: qty 1

## 2014-07-15 MED ORDER — POTASSIUM CHLORIDE CRYS ER 20 MEQ PO TBCR
40.0000 meq | EXTENDED_RELEASE_TABLET | ORAL | Status: AC
  Administered 2014-07-15 (×3): 40 meq via ORAL
  Filled 2014-07-15 (×3): qty 2

## 2014-07-15 NOTE — Progress Notes (Addendum)
A&O Patient continued to have Tachycardia throughout the night. Continued telemetry- Kathy [name] continued. Patient scored enough to receive Ativan 1 mg po on one occasion. Ambulating around nurses station. Family stayed at bedside for most of the night, supportive. Scheduled Zofran, no c/o nausea during night. C/o headache - Tylenol x 1. No acute changes.

## 2014-07-15 NOTE — Consult Note (Signed)
  Psychiatry: Follow-up for this 54 year old woman with alcohol abuse. Patient says she is feeling fine. Mother points out that the patient is still not eating much. Patient does admit to still having some abdominal pain which she blames on a hiatal hernia. She says her mood is feeling good. Vital signs appear to be stable. She still has some electrolyte imbalances however which kept her in the hospital for another day.  On mental status patient is awake alert adequately groomed. Good eye contact. Normal psychomotor activity. Patient normal rate tone and volume. Smiling. Denies suicidal ideation. Denies psychotic symptoms.  Psychoeducation supportive counseling completed. No change to medication. Continue to strongly suggest that she be followed up in an intensive outpatient program after discharge. Family aware of the recommendation. Change to medicine.

## 2014-07-15 NOTE — Progress Notes (Signed)
[facility] at [place] NAME: Kathy [name]    MR#:  [id number]  DATE OF BIRTH:  03/15/1960  SUBJECTIVE:  CHIEF COMPLAINT:   Chief Complaint  Patient presents with  . Vomiting  . Dehydration   Shaky but up and walking in the room. No appetite, no vomiting.  REVIEW OF SYSTEMS:  Review of Systems  Constitutional: Negative for fever.  Respiratory: Negative for shortness of breath.   Cardiovascular: Negative for chest pain and palpitations.  Gastrointestinal: Negative for nausea, vomiting and abdominal pain.  Genitourinary: Negative for dysuria.    DRUG ALLERGIES:   Allergies  Allergen Reactions  . Aspirin Swelling    VITALS:  Blood pressure 93/69, pulse 106, temperature 99.1 F (37.3 C), temperature source Oral, resp. rate 19, height 5\' 2"  (1.575 m), weight 59.875 kg (132 lb), SpO2 98 %.  PHYSICAL EXAMINATION:   Physical Exam  Constitutional: She is oriented to person, place, and time and well-developed, well-nourished, and in no distress.  HENT:  Head: Normocephalic and atraumatic.  Neck: Normal range of motion. Neck supple. No tracheal deviation present.  Cardiovascular: Normal rate and regular rhythm.   No murmur heard. Pulmonary/Chest: Effort normal and breath sounds normal.  Abdominal: Soft. Bowel sounds are normal. She exhibits distension. She exhibits no mass. There is tenderness.  Musculoskeletal: Normal range of motion. She exhibits no edema.  Lymphadenopathy:    She has no cervical adenopathy.  Neurological: She is alert and oriented to person, place, and time.  Slight tremor  Skin: Skin is warm and dry.   LABORATORY PANEL:   CBC  Recent Labs Lab 07/13/14 0446 07/15/14 0435  WBC 7.4  --   HGB 13.3  --   HCT 40.7  --   PLT 122* 95*   ------------------------------------------------------------------------------------------------------------------  Chemistries   Recent Labs Lab 07/15/14 0435   NA 134*  K 3.2*  CL 100*  CO2 27  GLUCOSE 128*  BUN <5*  CREATININE 0.48  CALCIUM 8.7*  MG 1.6*   ------------------------------------------------------------------------------------------------------------------   ASSESSMENT AND PLAN:   * metabolic acidosis: resolved, last abg alkalotic. Due to Alcohol and starvation  -  Appreciated nephrology consult. - bicarb gtt stopped    * Alcohol dependence   - continue CIWA protocol.  - appriciate Psych to help.   - No signs of withdrawal. On librium and Ativan PRN.  * hypokalemia, Hypomagnesemia, Hypophosphatemeia   Replacing- recheck.   - reason is chronic heavy alcohol use, refeeding syndrome  * thrombocytopenia    Due to alcohol abuse.no active bleed. - hold heparin  * nausea and vomit   Due to alcohol abuse- likely alcohol gastritis or due to acidosis. - no further vomiting over past 24 hours - continue protonix  * Smoking   Councelled to quit for 4 min, Nicotrol inhaler offered.  * Depression   Continue lexapro.  All the records are reviewed and case discussed with Care Management/Social Workerr. Management plans discussed with the patient, family and they are in agreement.  CODE STATUS: full  TOTAL TIME TAKING CARE OF THIS PATIENT: 40 minutes.   POSSIBLE D/C IN 1-2 DAYS, DEPENDING ON CLINICAL CONDITION.   Myrtis Ser M.D on 07/15/2014   Between 7am to 6pm - Pager - [phone]  After 6pm go to [email or web address] - password [email or web address] [facility]  Office  [phone]  CC: Primary care physician; Dion Body, MD

## 2014-07-15 NOTE — Progress Notes (Signed)
[facility]  ROUNDING NOTE   Subjective:   Now off IV fluids. Meal tray at bedside but untouched this morning.  No family at bedside today.   Objective:  Vital signs in last 24 hours:  Temp:  [97.7 F (36.5 C)-99.1 F (37.3 C)] 99.1 F (37.3 C) (05/22 0742) Pulse Rate:  [106-129] 106 (05/22 0742) Resp:  [17-19] 19 (05/21 2358) BP: (93-134)/(69-90) 93/69 mmHg (05/22 0742) SpO2:  [98 %-100 %] 98 % (05/22 0742)  Weight change:  Filed Weights   07/12/14 0846  Weight: 59.875 kg (132 lb)    Intake/Output: I/O last 3 completed shifts: In: 1124.7 [P.O.:240; I.V.:834.7; IV Piggyback:50] Out: 1300 [Urine:1300]   Intake/Output this shift:     Physical Exam: General: NAD,   Head: Normocephalic, atraumatic. Moist oral mucosal membranes  Eyes: Anicteric, PERRL  Neck: Supple, trachea midline  Lungs:  Clear to auscultation  Heart: Regular rate and rhythm  Abdomen:  Soft, nontender,   Extremities:  no peripheral edema.  Neurologic: Nonfocal, moving all four extremities  Skin: No lesions       Basic Metabolic Panel:  Recent Labs Lab 07/13/14 0446 07/14/14 0550 07/14/14 0624 07/14/14 1326 07/14/14 1759 07/15/14 0435  NA 132* 126*  --  124* 128* 134*  K 4.2 2.7*  --  3.1*  3.1* 3.1* 3.2*  CL 104 89*  --  88* 92* 100*  CO2 11* 26  --  25 26 27   GLUCOSE 99 195*  --  143* 173* 128*  BUN 5* <5*  --  <5* <5* <5*  CREATININE 0.75 0.53  --  0.59 0.61 0.48  CALCIUM 8.6* 9.0  --  8.9 9.0 8.7*  MG  --   --  1.3*  --   --  1.6*  PHOS  --   --   --  <1.0* <1.0* <1.0*    Liver Function Tests:  Recent Labs Lab 07/14/14 1326 07/14/14 1759  ALBUMIN 3.8 3.8    Recent Labs Lab 07/12/14 0909  LIPASE 141*   No results for input(s): AMMONIA in the last 168 hours.  CBC:  Recent Labs Lab 07/12/14 0909 07/13/14 0446 07/15/14 0435  WBC 9.1 7.4  --   NEUTROABS 7.1*  --   --   HGB 15.8 13.3  --   HCT 47.3* 40.7  --   MCV 112.2* 113.1*  --   PLT 164  122* 95*    Cardiac Enzymes: No results for input(s): CKTOTAL, CKMB, CKMBINDEX, TROPONINI in the last 168 hours.  BNP: Invalid input(s): POCBNP  CBG:  Recent Labs Lab 07/12/14 0847  GLUCAP 112*    Microbiology: No results found for this or any previous visit.  Coagulation Studies: No results for input(s): LABPROT, INR in the last 72 hours.  Urinalysis: No results for input(s): COLORURINE, LABSPEC, PHURINE, GLUCOSEU, HGBUR, BILIRUBINUR, KETONESUR, PROTEINUR, UROBILINOGEN, NITRITE, LEUKOCYTESUR in the last 72 hours.  Invalid input(s): APPERANCEUR    Imaging: No results found.   Medications:     . chlordiazePOXIDE  25 mg Oral TID  . escitalopram  10 mg Oral Daily  . folic acid  1 mg Oral Daily  . heparin  5,000 Units Subcutaneous 3 times per day  . LORazepam  0-4 mg Intravenous Q12H  . LORazepam  0-4 mg Oral Q12H  . multivitamin with minerals  1 tablet Oral Daily  . ondansetron (ZOFRAN) IV  4 mg Intravenous Q4H   Or  . ondansetron  4 mg Oral Q4H  .  pantoprazole  40 mg Oral Daily  . phosphorus  500 mg Oral TID  . potassium phosphate IVPB (mmol)  30 mmol Intravenous Once  . sodium chloride  3 mL Intravenous Q12H  . thiamine  100 mg Oral Daily   acetaminophen **OR** acetaminophen, alum & mag hydroxide-simeth, LORazepam **OR** LORazepam, nicotine, senna-docusate  Assessment/ Plan:  Ms. Kathy [name] is a 54 y.o.  female with abdominal hernia and alcohol abuse, was admitted to [facility] on 07/12/2014 with nausea, vomiting in the setting of alcohol abuse.   1. Anion gap metabolic acidosis. 2. Hyponatremia 3. Hypokalemia 4. Hypomagnesiumia 5. Alcohol abuse.  6. Thrombocytopenia. 7. Hypophosphatemia  Plan: Acidosis corrected, secondary to starvation ketoacidosis and alcohol induced ketoacidosis. Corrected with bicarbonate. But then with hyponatremia and hypokalemia and hypomagnesemia.  - Now with PO replacement.  - Will continue to monitor.    LOS:  3 Kathy [name] 5/22/201611:32 AM

## 2014-07-16 LAB — COMPREHENSIVE METABOLIC PANEL
ALT: 64 U/L — ABNORMAL HIGH (ref 14–54)
AST: 126 U/L — ABNORMAL HIGH (ref 15–41)
Albumin: 3.4 g/dL — ABNORMAL LOW (ref 3.5–5.0)
Alkaline Phosphatase: 97 U/L (ref 38–126)
Anion gap: 2 — ABNORMAL LOW (ref 5–15)
BUN: <5 mg/dL — ABNORMAL LOW (ref 6–20)
CO2: 29 mmol/L (ref 22–32)
Calcium: 8.2 mg/dL — ABNORMAL LOW (ref 8.9–10.3)
Chloride: 103 mmol/L (ref 101–111)
Creatinine, Ser: 0.48 mg/dL (ref 0.44–1.00)
GFR calc Af Amer: >60 mL/min (ref >60)
GFR calc non Af Amer: >60 mL/min (ref >60)
Glucose, Bld: 135 mg/dL — ABNORMAL HIGH (ref 65–99)
Potassium: 4.1 mmol/L (ref 3.5–5.1)
Sodium: 134 mmol/L — ABNORMAL LOW (ref 135–145)
Total Bilirubin: 0.5 mg/dL (ref 0.3–1.2)
Total Protein: 6.5 g/dL (ref 6.5–8.1)

## 2014-07-16 LAB — CBC
HCT: 37.6 % (ref 35.0–47.0)
Hemoglobin: 13.1 g/dL (ref 12.0–16.0)
MCH: 37.5 pg — ABNORMAL HIGH (ref 26.0–34.0)
MCHC: 34.8 g/dL (ref 32.0–36.0)
MCV: 107.8 fL — ABNORMAL HIGH (ref 80.0–100.0)
Platelets: 126 10*3/uL — ABNORMAL LOW (ref 150–440)
RBC: 3.48 MIL/uL — ABNORMAL LOW (ref 3.80–5.20)
RDW: 14.6 % — ABNORMAL HIGH (ref 11.5–14.5)
WBC: 4.9 10*3/uL (ref 3.6–11.0)

## 2014-07-16 LAB — MAGNESIUM: Magnesium: 1.7 mg/dL (ref 1.7–2.4)

## 2014-07-16 LAB — PHOSPHORUS: Phosphorus: 2.9 mg/dL (ref 2.5–4.6)

## 2014-07-16 MED ORDER — COMPLETE NUTRITION PO LIQD: 237.0000 mL | Freq: Two times a day (BID) | ORAL | Status: DC

## 2014-07-16 MED ORDER — ONDANSETRON HCL 4 MG PO TABS: 4.0000 mg | ORAL_TABLET | ORAL | Status: DC

## 2014-07-16 MED ORDER — CHLORDIAZEPOXIDE HCL 25 MG PO CAPS: 25.0000 mg | ORAL_CAPSULE | Freq: Two times a day (BID) | ORAL | Status: DC

## 2014-07-16 MED ORDER — ADULT MULTIVITAMIN W/MINERALS CH: 1.0000 | ORAL_TABLET | Freq: Every day | ORAL | Status: DC

## 2014-07-16 NOTE — Discharge Instructions (Signed)
Please follow up with your primary care provider as scheduled. You will need to abstain from alcohol use to improve your health and prepare for upcoming surgery. You will need support in alcohol cessation. Please review the materials provided by social work.   DIET:  Regular diet plus dietary supplement and multivitamin  DISCHARGE CONDITION:  Fair  ACTIVITY:  Activity as tolerated  OXYGEN:  Home Oxygen: No.   Oxygen Delivery: room air  DISCHARGE LOCATION:  home with family support  If you experience worsening of your admission symptoms, develop shortness of breath, life threatening emergency, suicidal or homicidal thoughts you must seek medical attention immediately by calling 911 or calling your MD immediately  if symptoms less severe.  You Must read complete instructions/literature along with all the possible adverse reactions/side effects for all the Medicines you take and that have been prescribed to you. Take any new Medicines after you have completely understood and accpet all the possible adverse reactions/side effects.   Please note  You were cared for by a hospitalist during your hospital stay. If you have any questions about your discharge medications or the care you received while you were in the hospital after you are discharged, you can call the unit and asked to speak with the hospitalist on call if the hospitalist that took care of you is not available. Once you are discharged, your primary care physician will handle any further medical issues. Please note that NO REFILLS for any discharge medications will be authorized once you are discharged, as it is imperative that you return to your primary care physician (or establish a relationship with a primary care physician if you do not have one) for your aftercare needs so that they can reassess your need for medications and monitor your lab values.

## 2014-07-16 NOTE — Clinical Social Work Note (Signed)
Patient discharging to home today. No further needs and CSW signing off.  Shela Leff MSW,LCSWA [phone]

## 2014-07-16 NOTE — Progress Notes (Signed)
Pt VSS. discharge to home. Concerns addressed. IV site removed. Discharge summary given.  Pt discharged with auxillary

## 2014-07-19 NOTE — Discharge Summary (Signed)
[facility] at [place] NAME: Kathy [name]    MR#:  [id number]  DATE OF BIRTH:  March 25, 1960  DATE OF ADMISSION:  07/12/2014 ADMITTING PHYSICIAN: Bettey Costa, MD  DATE OF DISCHARGE: 07/16/2014 12:47 PM  PRIMARY CARE PHYSICIAN: Dion Body, MD    ADMISSION DIAGNOSIS:  Acidosis [E87.2] Diarrhea [R19.7] Dehydration [E86.0] Non-intractable vomiting with nausea, vomiting of unspecified type [R11.2]  DISCHARGE DIAGNOSIS:  Principal Problem:   Alcohol withdrawal Active Problems:   Acidosis   Alcohol dependence   Alcohol-induced mood disorder   Alcohol dependence with alcohol-induced mood disorder   SECONDARY DIAGNOSIS:   Past Medical History  Diagnosis Date  . Hernia of abdominal cavity     HOSPITAL COURSE:   * metabolic acidosis: resolved, last abg alkalotic. Due to Alcohol and starvation. Resolved with bicarb.   * Alcohol dependence: CIWA protocol followed during hospitalization. Seen by psychiatry. Treated with librium and discharge with quick taper.  No signs of withdrawal at time of discharge. Has family support and will be living with her parents on discharge. She feels ready to quit drinking.  Discussed daily while inpatient.  * hypokalemia, Hypomagnesemia, Hypophosphatemeia: Due to refeeding. Electrolytes stabilized during hospitalization. Well rounded diet with supplementation advised.  * thrombocytopenia: Due to alcohol abuse. No active bleeding. No heparin during admission.  * nausea and vomiting: Due to alcohol abuse- likely alcohol gastritis. Continue with PPI. At the time of discharge she is tolerating a regular diet without nausea or vomiting.  * Smoking: Councelled to quit daily during admission. She feels ready to quit. Declined offer of nicotine replacement  * Depression: Continue lexapro. Will need significant support as she attempts to quit drinking and smoking.   DISCHARGE  CONDITIONS:   Stable  CONSULTS OBTAINED:  Treatment Team:  Gonzella Lex, MD Anthonette Legato, MD Aldean Jewett, MD  DRUG ALLERGIES:   Allergies  Allergen Reactions  . Aspirin Swelling    DISCHARGE MEDICATIONS:   Discharge Medication List as of 07/16/2014 12:23 PM    START taking these medications   Details  chlordiazePOXIDE (LIBRIUM) 25 MG capsule Take 1 capsule (25 mg total) by mouth 2 (two) times daily. Take every 12 hours tomorrow, once on Wednesday and then stop., Starting 07/16/2014, Until Discontinued, Print    Multiple Vitamin (MULTIVITAMIN WITH MINERALS) TABS tablet Take 1 tablet by mouth daily., Starting 07/16/2014, Until Discontinued, Print    Nutritional Supplements (COMPLETE NUTRITION) LIQD Take 237 mLs by mouth 2 (two) times daily between meals., Starting 07/16/2014, Until Discontinued, Print    ondansetron (ZOFRAN) 4 MG tablet Take 1 tablet (4 mg total) by mouth every 4 (four) hours., Starting 07/16/2014, Until Discontinued, Print      CONTINUE these medications which have NOT CHANGED   Details  cetirizine (ZYRTEC) 10 MG tablet Take 10 mg by mouth as needed for allergies., Until Discontinued, Historical Med    escitalopram (LEXAPRO) 10 MG tablet Take 10 mg by mouth daily., Until Discontinued, Historical Med    Ketotifen Fumarate (ALAWAY OP) Apply 1 drop to eye daily. 0.035%, Until Discontinued, Historical Med    omeprazole (PRILOSEC) 20 MG capsule Take 20 mg by mouth daily., Until Discontinued, Historical Med      STOP taking these medications     amoxicillin (AMOXIL) 500 MG capsule          DISCHARGE INSTRUCTIONS:    See AVS  If you experience worsening of your admission symptoms, develop  shortness of breath, life threatening emergency, suicidal or homicidal thoughts you must seek medical attention immediately by calling 911 or calling your MD immediately  if symptoms less severe.  You Must read complete instructions/literature along with all  the possible adverse reactions/side effects for all the Medicines you take and that have been prescribed to you. Take any new Medicines after you have completely understood and accept all the possible adverse reactions/side effects.   Please note  You were cared for by a hospitalist during your hospital stay. If you have any questions about your discharge medications or the care you received while you were in the hospital after you are discharged, you can call the unit and asked to speak with the hospitalist on call if the hospitalist that took care of you is not available. Once you are discharged, your primary care physician will handle any further medical issues. Please note that NO REFILLS for any discharge medications will be authorized once you are discharged, as it is imperative that you return to your primary care physician (or establish a relationship with a primary care physician if you do not have one) for your aftercare needs so that they can reassess your need for medications and monitor your lab values.    Today   CHIEF COMPLAINT:   Chief Complaint  Patient presents with  . Vomiting  . Dehydration    HISTORY OF PRESENT ILLNESS:  Kathy [name] is a 54 y.o. female with a known history of renal hernia, alcohol and tobacco dependence who presents with above complaint. Patient reports nausea, vomiting and diarrhea for the past 2 days. She denies any fever or chills. She denies abdominal pain. She's had poor appetite for 2 days. She continues to drink on call. She is scheduled for a hiatal hernia surgery in a partially 2 months however she needs to stop drinking and stop smoking prior to the surgery. She denies any intoxication with illicit drugs or isopropyl alcohol or methanol. She does not take aspirin as she is allergic to aspirin. She presents to the emergency room and is found to have severe acidosis with an anion gap of 30 and CO2 of 10.  VITAL SIGNS:  Blood pressure 120/68,  pulse 101, temperature 98.6 F (37 C), temperature source Oral, resp. rate 17, height 5\' 2"  (1.575 m), weight 59.875 kg (132 lb), SpO2 98 %.  I/O:  No intake or output data in the 24 hours ending 07/19/14 1403  PHYSICAL EXAMINATION:  Constitutional: She is oriented to person, place, and time and well-developed, well-nourished, and in no distress.  HENT:  Head: Normocephalic and atraumatic.  Neck: Normal range of motion. Neck supple. No tracheal deviation present.  Cardiovascular: Normal rate and regular rhythm.  No murmur heard. Pulmonary/Chest: Effort normal and breath sounds normal.  Abdominal: Soft. Bowel sounds are normal. She exhibits distension. She exhibits no mass. There is tenderness.  Musculoskeletal: Normal range of motion. She exhibits no edema.  Lymphadenopathy:   She has no cervical adenopathy.  Neurological: She is alert and oriented to person, place, and time.  Slight tremor  Skin: Skin is warm and dry.    DATA REVIEW:   CBC  Recent Labs Lab 07/16/14 0518  WBC 4.9  HGB 13.1  HCT 37.6  PLT 126*    Chemistries   Recent Labs Lab 07/16/14 0518  NA 134*  K 4.1  CL 103  CO2 29  GLUCOSE 135*  BUN <5*  CREATININE 0.48  CALCIUM 8.2*  MG  1.7  AST 126*  ALT 64*  ALKPHOS 97  BILITOT 0.5    Cardiac Enzymes No results for input(s): TROPONINI in the last 168 hours.  Microbiology Results  No results found for this or any previous visit.  RADIOLOGY:  No results found.  EKG:  No orders found for this or any previous visit.    Management plans discussed with the patient, family and they are in agreement.  CODE STATUS: Full  TOTAL TIME TAKING CARE OF THIS PATIENT: 35 minutes.    Myrtis Ser M.D on 07/19/2014 at 2:03 PM  Between 7am to 6pm - Pager - [phone]  After 6pm go to [email or web address] - password [email or web address] [facility]  Office  [phone]  CC: Primary care physician; Dion Body, MD

## 2014-09-07 ENCOUNTER — Telehealth: Payer: Self-pay

## 2014-09-07 NOTE — Telephone Encounter (Signed)
Patient called stating that she had seen Dr. Katherina Mires from [facility] for her ventral hernia. However, she was told by Dr. Rob Hickman that she needed to be seen by a psychiatrist (Dr. Gwyndolyn Saxon) due to her drinking and smoking. Patient stated that she had scheduled her psychiatric appointment but they were requesting Dr. Rolin Barry last two office notes and her ED notes. Patient gave me Dr. Grace Bushy fax [phone]). I will fax her last two office notes and ED notes as per patient's request.

## 2014-09-11 ENCOUNTER — Other Ambulatory Visit: Payer: Self-pay

## 2014-09-11 MED ORDER — OMEPRAZOLE 20 MG PO CPDR: 20.0000 mg | DELAYED_RELEASE_CAPSULE | Freq: Every day | ORAL | Status: DC

## 2014-09-11 NOTE — Telephone Encounter (Signed)
Received pharmacy request at this time for Omeprazole. Pt last seen 01/2014. Pt will need seen once again in December 2016 prior to any further refills being sent.  Refills x 6 sent to [facility].

## 2014-09-13 ENCOUNTER — Telehealth: Payer: Self-pay | Admitting: Surgery

## 2014-09-13 NOTE — Telephone Encounter (Signed)
I have faxed clinic notes Date: 03/13/14 and 05/01/14 from Dr Rolin Barry office to [facility] @ [phone]. I have spoke with front desk at that clinic to advise them to make pt an appointment with Dr Faith Rogue.

## 2014-09-26 ENCOUNTER — Ambulatory Visit (INDEPENDENT_AMBULATORY_CARE_PROVIDER_SITE_OTHER): Payer: 59 | Admitting: Licensed Clinical Social Worker

## 2014-09-26 ENCOUNTER — Encounter: Payer: Self-pay | Admitting: Licensed Clinical Social Worker

## 2014-09-26 DIAGNOSIS — F1020 Alcohol dependence, uncomplicated: Secondary | ICD-10-CM

## 2014-09-26 NOTE — Progress Notes (Signed)
Patient:   Kathy [name]   DOB:   04-25-1960  MR Number:  [id number]  Location:  [facility] [address] [place] [address] Dept: [phone]           Date of Service:   09/26/2014  Start Time:   9:00am End Time:   10:00am  Provider/Observer:  Lubertha South Counselor       Billing Code/Service: 24401  Behavioral Observation: Kathy [name]  presents as a 54 y.o.-year-old Caucasian Female who appeared her stated age. her dress was Appropriate and she was Casual and her manners were Appropriate to the situation.  There were not any physical disabilities noted.  she displayed an appropriate level of cooperation and motivation.    Interactions:    Active   Attention:   normal  Memory:   within normal limits  Speech (Volume):  normal  Speech:   normal volume  Thought Process:  Coherent  Though Content:  WNL  Orientation:   person, place, time/date, situation, day of week and month of year  Judgment:   Fair  Planning:   Fair  Affect:    Flat  Mood:    Anxious  Insight:   Fair  Intelligence:   normal  Chief Complaint:     Chief Complaint  Patient presents with  . Medication Problem  . Anxiety  . Depression  . Drug Problem    Reason for Service:  To reduce alcohol intake  Current Symptoms:  Alcohol, depression, sadness  Source of Distress:              Being lonely; thinking about drinking  Marital Status/Living: Divorced for the past ten years.  Currently living with her parents for the past three years.  Reports that her previous roommate moved out when she was not home.  She was unable to financiallyy maintain the household  Employment History: Currently employed Part time at [employer] and [employer] in [place].  Attended [employer]; completed one year.  Attended [employer] in 1982.   Reports that she was popular and did not have any concerns.  Legal History:  DUI in 2009  Military Experience:  denies   Religious/Spiritual Preferences:  [place]  Family/Childhood History:                           Reports that she is an only child; only grand child.  Reports that she had a "wonderful" childhood.  She describes her childhood as love and caring   Children/Grand-children:    Kathy [name] 23  Natural/Informal Support:                           Children and parents are supportive.  Her employers are supportive Kathy [name] her friend is supportive   Substance Use:  There is a documented history of alcohol and tobacco abuse confirmed by the patient.  Reports that she has used Alcohol for the past thirty years.  Tobacco usage since 18 smokes about 12 cigarettes daily for the past two years.  Reports that she uses [employer].   Medical History:   Past Medical History  Diagnosis Date  . Hernia of abdominal cavity  Medication List       This list is accurate as of: 09/26/14  8:59 AM.  Always use your most recent med list.               ALAWAY OP  Apply 1 drop to eye daily. 0.035%     cetirizine 10 MG tablet  Commonly known as:  ZYRTEC  Take 10 mg by mouth as needed for allergies.     chlordiazePOXIDE 25 MG capsule  Commonly known as:  LIBRIUM  Take 1 capsule (25 mg total) by mouth 2 (two) times daily. Take every 12 hours tomorrow, once on Wednesday and then stop.     COMPLETE NUTRITION Liqd  Take 237 mLs by mouth 2 (two) times daily between meals.     escitalopram 10 MG tablet  Commonly known as:  LEXAPRO  Take 10 mg by mouth daily.     multivitamin with minerals Tabs tablet  Take 1 tablet by mouth daily.     omeprazole 20 MG capsule  Commonly known as:  PRILOSEC  Take 1 capsule (20 mg total) by mouth daily.     ondansetron 4 MG tablet  Commonly known as:  ZOFRAN  Take 1 tablet (4 mg total) by mouth every 4  (four) hours.               Abuse/Trauma History: denies   Psychiatric History:  Detox May 19-May 23 at [facility].     Strengths:   Sales, cook, hospitality, fixing things   Recovery Goals:  "I want my own car, drviers license, I want my emotions to calm down,  I want to never drink and drive again.  I want to have a social life without drinking  Hobbies/Interests:               Walk, swim, go to beach, ocean   Challenges/Barriers: drinking    Family Med/Psych History: denies  Risk of Suicide/Violence: low   History of Suicide/Violence:  low  Psychosis:   denies  Diagnosis:    F10.20 Alcohol Dependence  Impression/DX:  Has a hernia that has pushed her stomach into her chest which has caused her to reduce eating.  Her appetite is currently cycling.  Reports that she is currently drinking alcohol for the past 30 years.  Likes drinking Counsellor; drinking a couple a day continues to sneak.  About 6 or 7 drinks daily.  Reports that she has a sponsor for the past 4 weeks.  Reports that she attends AA meetings twice weekly on Monday & Friday. Detoxed in the hospital and received Librium. Currently living with her parents.  Reports that she is experiencing anxiety (worrying, decreased concentration). She had a DUI about 8 years ago.  Reports that she does not have her license but she is able to obtain them.  Reports that she does not have to have the vehicle operated [place]. Reports that she takes a few sips of vodka while at work.  Has been divorced for the past ten years. Reports that she has been drinking at age 22.  Does not have scerosis of the liver but has a lot of fatty tissue  Recommendation/Plan: Writer recommends Outpatient Therapy at least twice monthly to include but not limited to individual, group and or family therapy.  Writer will use MI to assist Patient with her recovery goals.  If behavior increases Writer to refer to a higher level of care (SAIOP).

## 2014-10-11 ENCOUNTER — Encounter: Payer: Self-pay | Admitting: Internal Medicine

## 2014-10-11 ENCOUNTER — Ambulatory Visit (INDEPENDENT_AMBULATORY_CARE_PROVIDER_SITE_OTHER): Payer: 59 | Admitting: Internal Medicine

## 2014-10-11 ENCOUNTER — Encounter: Payer: Self-pay | Admitting: *Deleted

## 2014-10-11 VITALS — BP 102/68 | HR 106 | Temp 98.4°F | Resp 12 | Ht 62.9 in | Wt 117.0 lb

## 2014-10-11 DIAGNOSIS — E559 Vitamin D deficiency, unspecified: Secondary | ICD-10-CM | POA: Diagnosis not present

## 2014-10-11 DIAGNOSIS — R5383 Other fatigue: Secondary | ICD-10-CM | POA: Diagnosis not present

## 2014-10-11 DIAGNOSIS — E876 Hypokalemia: Secondary | ICD-10-CM | POA: Diagnosis not present

## 2014-10-11 DIAGNOSIS — D638 Anemia in other chronic diseases classified elsewhere: Secondary | ICD-10-CM

## 2014-10-11 DIAGNOSIS — F1024 Alcohol dependence with alcohol-induced mood disorder: Secondary | ICD-10-CM

## 2014-10-11 DIAGNOSIS — F10930 Alcohol use, unspecified with withdrawal, uncomplicated: Secondary | ICD-10-CM

## 2014-10-11 DIAGNOSIS — R739 Hyperglycemia, unspecified: Secondary | ICD-10-CM | POA: Diagnosis not present

## 2014-10-11 DIAGNOSIS — F10230 Alcohol dependence with withdrawal, uncomplicated: Secondary | ICD-10-CM

## 2014-10-11 DIAGNOSIS — K760 Fatty (change of) liver, not elsewhere classified: Secondary | ICD-10-CM | POA: Diagnosis not present

## 2014-10-11 DIAGNOSIS — Z23 Encounter for immunization: Secondary | ICD-10-CM | POA: Diagnosis not present

## 2014-10-11 DIAGNOSIS — F1020 Alcohol dependence, uncomplicated: Secondary | ICD-10-CM

## 2014-10-11 LAB — CBC WITH DIFFERENTIAL/PLATELET
Basophils Absolute: 0 10*3/uL (ref 0.0–0.1)
Basophils Relative: 0.3 % (ref 0.0–3.0)
Eosinophils Absolute: 0.1 10*3/uL (ref 0.0–0.7)
Eosinophils Relative: 0.8 % (ref 0.0–5.0)
HCT: 47 % — ABNORMAL HIGH (ref 36.0–46.0)
Hemoglobin: 15.9 g/dL — ABNORMAL HIGH (ref 12.0–15.0)
Lymphocytes Relative: 31.8 % (ref 12.0–46.0)
Lymphs Abs: 3.2 10*3/uL (ref 0.7–4.0)
MCHC: 33.8 g/dL (ref 30.0–36.0)
MCV: 105.9 fl — ABNORMAL HIGH (ref 78.0–100.0)
Monocytes Absolute: 0.7 10*3/uL (ref 0.1–1.0)
Monocytes Relative: 6.8 % (ref 3.0–12.0)
Neutro Abs: 6.1 10*3/uL (ref 1.4–7.7)
Neutrophils Relative %: 60.3 % (ref 43.0–77.0)
Platelets: 207 10*3/uL (ref 150.0–400.0)
RBC: 4.43 Mil/uL (ref 3.87–5.11)
RDW: 16.7 % — ABNORMAL HIGH (ref 11.5–15.5)
WBC: 10.1 10*3/uL (ref 4.0–10.5)

## 2014-10-11 LAB — COMPREHENSIVE METABOLIC PANEL
ALT: 75 U/L — ABNORMAL HIGH (ref 0–35)
AST: 215 U/L — ABNORMAL HIGH (ref 0–37)
Albumin: 3.7 g/dL (ref 3.5–5.2)
Alkaline Phosphatase: 139 U/L — ABNORMAL HIGH (ref 39–117)
BUN: 4 mg/dL — ABNORMAL LOW (ref 6–23)
CO2: 27 mEq/L (ref 19–32)
Calcium: 9.3 mg/dL (ref 8.4–10.5)
Chloride: 98 mEq/L (ref 96–112)
Creatinine, Ser: 0.46 mg/dL (ref 0.40–1.20)
GFR: 151.15 mL/min (ref >60.00)
Glucose, Bld: 129 mg/dL — ABNORMAL HIGH (ref 70–99)
Potassium: 3.6 mEq/L (ref 3.5–5.1)
Sodium: 135 mEq/L (ref 135–145)
Total Bilirubin: 0.5 mg/dL (ref 0.2–1.2)
Total Protein: 7.3 g/dL (ref 6.0–8.3)

## 2014-10-11 LAB — LIPID PANEL
Cholesterol: 193 mg/dL (ref 0–200)
HDL: 45.3 mg/dL (ref >39.00)
NonHDL: 148.1
Total CHOL/HDL Ratio: 4
Triglycerides: 282 mg/dL — ABNORMAL HIGH (ref 0.0–149.0)
VLDL: 56.4 mg/dL — ABNORMAL HIGH (ref 0.0–40.0)

## 2014-10-11 LAB — HEMOGLOBIN A1C: Hgb A1c MFr Bld: 5.5 % (ref 4.6–6.5)

## 2014-10-11 LAB — VITAMIN D 25 HYDROXY (VIT D DEFICIENCY, FRACTURES): VITD: 39.73 ng/mL (ref 30.00–100.00)

## 2014-10-11 LAB — VITAMIN B12: Vitamin B-12: 360 pg/mL (ref 211–911)

## 2014-10-11 LAB — TSH: TSH: 1.41 u[IU]/mL (ref 0.35–4.50)

## 2014-10-11 LAB — LDL CHOLESTEROL, DIRECT: Direct LDL: 119 mg/dL

## 2014-10-11 LAB — MAGNESIUM: Magnesium: 1.8 mg/dL (ref 1.5–2.5)

## 2014-10-11 MED ORDER — OMEPRAZOLE 40 MG PO CPDR: 40.0000 mg | DELAYED_RELEASE_CAPSULE | Freq: Every day | ORAL | Status: DC

## 2014-10-11 MED ORDER — CHLORDIAZEPOXIDE HCL 25 MG PO CAPS: 25.0000 mg | ORAL_CAPSULE | Freq: Two times a day (BID) | ORAL | Status: DC

## 2014-10-11 MED ORDER — ONDANSETRON HCL 4 MG PO TABS: 4.0000 mg | ORAL_TABLET | ORAL | Status: DC

## 2014-10-11 MED ORDER — CYANOCOBALAMIN 1000 MCG/ML IJ SOLN
1000.0000 ug | Freq: Once | INTRAMUSCULAR | Status: AC
  Administered 2014-10-11: 1000 ug via INTRAMUSCULAR

## 2014-10-11 NOTE — Patient Instructions (Signed)
I am prescribing Librium 25 mg every 12 hours for the next two weeks.  If you drink alcohol while you are taking librium, you increase your risk of death from respiratory failure and you will not receive any additional refills from this office.    Please return in 2 weeks for follow up  If your B12 is low today, we will continue injections weekly to improve it

## 2014-10-11 NOTE — Progress Notes (Signed)
Subjective:  Patient ID: Kathy [name], female    DOB: 21-Sep-1960  Age: 54 y.o. MRN: [id number]  CC: The primary encounter diagnosis was Other fatigue. Diagnoses of Hypokalemia, Vitamin D deficiency, Anemia of chronic disease, Hyperglycemia, Fatty liver, Need for prophylactic vaccination and inoculation against viral hepatitis, Alcohol dependence with alcohol-induced mood disorder, Alcohol withdrawal, uncomplicated, and Uncomplicated alcohol dependence were also pertinent to this visit.  HPI Kathy [name] presents for 1) alcohol and tobacco dependence.  2) lhiatal heria needing repair.  Patient was admitted to [facility] in May with metabolic acidosis  Secondary to dehydration from intractable vomiting and ongoing alcohol abuse .  Was stabilized, detoxed and discharged on May 23 ,  Her former PCP would not refill the  librium to help her with her alcohol detox .  Stopped Taking lexapro after discharge,  Dr. Carlean Jews tried Paxil, other SSRIs which did not help. "I'm happy." I just like to drink and smoke.  Wants to be off of both in order to have surgery,  For 6 weeks.   Currently drinks "more than I should."  Has been drinkign for 32 years,  Not when she was pregnant.  Only drinking one ounce daily .  At the height of her alcohol abusre was drinking 7 ounces daily  Has tremors daily if she does not drink.  Has a shot of vodka to stop the tremorsLast drink of etoh was this morning. "I was doing finr on librium 25 mg twice daily"   Did not tolerate ativan too sedating.  Does not use narcotics. Lives with parents and daughter,  Has been hiding her alcohol use fro m them because they have been restricting her completely.  Understands that she cannot continue using alcohol with the llibrium  Has daily nausea requesting refill on Zofran   Has fatty liver diagnosed by Dr Pat Patrick during hospitalization.  Was seen at [facility] for discussion of hiatal hernia surgery Taking B1, magnesium 400 mg and potassium   Drinks Boost  protein shakes.      .    Sees Dr. Laurey Morale for annual PAP and breast exam/mammogram  Soon .  No history of abnormals.   Sees psychiatrist  Dr. Faith Rogue August 31    History Kathy [name] has a past medical history of Hernia of abdominal cavity; Alcohol abuse; Depression; Chicken pox; and Colon polyps.   She has no past surgical history on file.   Her family history includes Cancer (age of onset: 78) in her father; Hypertension in her mother.She reports that she has been smoking Cigarettes.  She has been smoking about 0.50 packs per day. She has never used smokeless tobacco. She reports that she does not drink alcohol or use illicit drugs.  Outpatient Prescriptions Prior to Visit  Medication Sig Dispense Refill  . cetirizine (ZYRTEC) 10 MG tablet Take 10 mg by mouth as needed for allergies.    . Ketotifen Fumarate (ALAWAY OP) Apply 1 drop to eye daily. 0.035%    . Multiple Vitamin (MULTIVITAMIN WITH MINERALS) TABS tablet Take 1 tablet by mouth daily. 30 tablet 11  . Nutritional Supplements (COMPLETE NUTRITION) LIQD Take 237 mLs by mouth 2 (two) times daily between meals. 60 Bottle 0  . omeprazole (PRILOSEC) 20 MG capsule Take 1 capsule (20 mg total) by mouth daily. 31 capsule 6  . ondansetron (ZOFRAN) 4 MG tablet Take 1 tablet (4 mg total) by mouth every 4 (four) hours. 20 tablet 0  . escitalopram (LEXAPRO) 10 MG tablet  Take 10 mg by mouth daily.    . chlordiazePOXIDE (LIBRIUM) 25 MG capsule Take 1 capsule (25 mg total) by mouth 2 (two) times daily. Take every 12 hours tomorrow, once on Wednesday and then stop. (Patient not taking: Reported on 10/11/2014) 3 capsule 0   No facility-administered medications prior to visit.    Review of Systems:  Patient denies headache, fevers, malaise, unintentional weight loss, skin rash, eye pain, sinus congestion and sinus pain, sore throat, dysphagia,  hemoptysis , cough, dyspnea, wheezing, chest pain, palpitations, orthopnea, edema, abdominal  pain, nausea, melena, diarrhea, constipation, flank pain, dysuria, hematuria, urinary  Frequency, nocturia, numbness, tingling, seizures,  Focal weakness, Loss of consciousness,  Tremor, insomnia, depression, anxiety, and suicidal ideation.     Objective:  BP 102/68 mmHg  Pulse 106  Temp(Src) 98.4 F (36.9 C) (Oral)  Resp 12  Ht 5' 2.9" (1.598 m)  Wt 117 lb (53.071 kg)  BMI 20.78 kg/m2  SpO2 97%  Physical Exam:  General appearance: alert, cooperative and appears stated age Ears: normal TM's and external ear canals both ears Throat: lips, mucosa, and tongue normal; teeth and gums normal Neck: no adenopathy, no carotid bruit, supple, symmetrical, trachea midline and thyroid not enlarged, symmetric, no tenderness/mass/nodules Back: symmetric, no curvature. ROM normal. No CVA tenderness. Lungs: clear to auscultation bilaterally Heart: regular rate and rhythm, S1, S2 normal, no murmur, click, rub or gallop Abdomen: soft, non-tender; bowel sounds normal; no masses,  no organomegaly Pulses: 2+ and symmetric Skin: Skin color, texture, turgor normal. No rashes or lesions Lymph nodes: Cervical, supraclavicular, and axillary nodes normal.   Assessment & Plan:   Problem List Items Addressed This Visit      Unprioritized   Alcohol withdrawal   Alcohol dependence    She has requested refill on librium.  She has noted that prior use of librium twice daily kept her from drinking alcohol.  She is attending AA twice weekly. Dose discussed with patient . She will return in two weeks       Hypokalemia    Resolved,  And mg was normal.  Advised to stop potassium supplement      Relevant Orders   Magnesium (Completed)   Comprehensive metabolic panel (Completed)   Anemia of chronic disease    advised by repeat CBC,  B12 levels are normal      Relevant Medications   cyanocobalamin ((VITAMIN B-12)) injection 1,000 mcg (Completed)   Other Relevant Orders   B12 (Completed)   Fatty liver     No prior liver biopsy for diagnosis.  Hep a and b vaccines advised.       Relevant Orders   Lipid panel (Completed)   Alcohol dependence with alcohol-induced mood disorder    Other Visit Diagnoses    Other fatigue    -  Primary    Relevant Orders    TSH (Completed)    CBC with Differential/Platelet (Completed)    Vitamin D deficiency        Relevant Orders    Vit D  25 hydroxy (rtn osteoporosis monitoring) (Completed)    Hyperglycemia        Relevant Orders    Hemoglobin A1c (Completed)    Need for prophylactic vaccination and inoculation against viral hepatitis        Relevant Orders    Hepatitis A hepatitis B combined vaccine IM (Completed)      A total of 45 minutes was spent with patient more than half of which  was spent in counseling patient on the above mentioned issues , reviewing and explaining recent labs and imaging studies done, and coordination of care.  I have changed Ms. Bernasconi's chlordiazePOXIDE, omeprazole, and ondansetron. I am also having her maintain her cetirizine, Ketotifen Fumarate (ALAWAY OP), escitalopram, COMPLETE NUTRITION, and multivitamin with minerals. We administered cyanocobalamin.  Meds ordered this encounter  Medications  . chlordiazePOXIDE (LIBRIUM) 25 MG capsule    Sig: Take 1 capsule (25 mg total) by mouth 2 (two) times daily. 2 week supply    Dispense:  28 capsule    Refill:  0  . omeprazole (PRILOSEC) 40 MG capsule    Sig: Take 1 capsule (40 mg total) by mouth daily.    Dispense:  90 capsule    Refill:  1    Patient will need an appointment prior to next refill request.  . ondansetron (ZOFRAN) 4 MG tablet    Sig: Take 1 tablet (4 mg total) by mouth every 4 (four) hours. As needed for nausea    Dispense:  30 tablet    Refill:  3  . cyanocobalamin ((VITAMIN B-12)) injection 1,000 mcg    Sig:     Medications Discontinued During This Encounter  Medication Reason  . chlordiazePOXIDE (LIBRIUM) 25 MG capsule Reorder  . omeprazole  (PRILOSEC) 20 MG capsule Reorder  . ondansetron (ZOFRAN) 4 MG tablet Reorder    Follow-up: Return in about 2 years (around 10/10/2016).   Crecencio Mc, MD

## 2014-10-11 NOTE — Progress Notes (Signed)
Pre-visit discussion using our clinic review tool. No additional management support is needed unless otherwise documented below in the visit note.  

## 2014-10-12 ENCOUNTER — Encounter: Payer: Self-pay | Admitting: *Deleted

## 2014-10-13 DIAGNOSIS — K700 Alcoholic fatty liver: Secondary | ICD-10-CM | POA: Insufficient documentation

## 2014-10-13 DIAGNOSIS — E876 Hypokalemia: Secondary | ICD-10-CM | POA: Insufficient documentation

## 2014-10-13 DIAGNOSIS — D751 Secondary polycythemia: Secondary | ICD-10-CM | POA: Insufficient documentation

## 2014-10-13 NOTE — Assessment & Plan Note (Deleted)
She has requested refill on librium.  She has noted that prior use of librium twice daily kept her from drinking alcohol.  She is attending AA twice weekly. Dose discussed with patient . She will return in two weeks

## 2014-10-13 NOTE — Assessment & Plan Note (Signed)
She has requested refill on librium.  She has noted that prior use of librium twice daily kept her from drinking alcohol.  She is attending AA twice weekly. Dose discussed with patient . She will return in two weeks

## 2014-10-13 NOTE — Assessment & Plan Note (Signed)
No prior liver biopsy for diagnosis.  Hep a and b vaccines advised.

## 2014-10-13 NOTE — Assessment & Plan Note (Signed)
Resolved,  And mg was normal.  Advised to stop potassium supplement

## 2014-10-13 NOTE — Assessment & Plan Note (Signed)
advised by repeat CBC,  B12 levels are normal

## 2014-10-24 ENCOUNTER — Ambulatory Visit: Payer: Self-pay | Admitting: Psychiatry

## 2014-11-05 ENCOUNTER — Encounter: Payer: Self-pay | Admitting: Internal Medicine

## 2014-11-13 ENCOUNTER — Ambulatory Visit: Payer: 59

## 2014-11-14 ENCOUNTER — Ambulatory Visit (INDEPENDENT_AMBULATORY_CARE_PROVIDER_SITE_OTHER): Payer: 59 | Admitting: *Deleted

## 2014-11-14 DIAGNOSIS — Z23 Encounter for immunization: Secondary | ICD-10-CM | POA: Diagnosis not present

## 2014-11-14 DIAGNOSIS — K760 Fatty (change of) liver, not elsewhere classified: Secondary | ICD-10-CM | POA: Diagnosis not present

## 2014-11-14 DIAGNOSIS — E538 Deficiency of other specified B group vitamins: Secondary | ICD-10-CM

## 2014-11-14 MED ORDER — CYANOCOBALAMIN 1000 MCG/ML IJ SOLN
1000.0000 ug | Freq: Once | INTRAMUSCULAR | Status: AC
  Administered 2014-11-14: 1000 ug via INTRAMUSCULAR

## 2014-11-20 ENCOUNTER — Telehealth: Payer: Self-pay | Admitting: Internal Medicine

## 2014-11-20 NOTE — Telephone Encounter (Signed)
Patient pharmacy sent over request for new medication called Xifaxan 550 mg tried to call patient to find out why the request no answer left message to call office.

## 2014-12-05 ENCOUNTER — Encounter: Payer: 59 | Admitting: Internal Medicine

## 2014-12-13 ENCOUNTER — Ambulatory Visit (INDEPENDENT_AMBULATORY_CARE_PROVIDER_SITE_OTHER): Payer: 59

## 2014-12-13 ENCOUNTER — Telehealth: Payer: Self-pay

## 2014-12-13 DIAGNOSIS — E538 Deficiency of other specified B group vitamins: Secondary | ICD-10-CM

## 2014-12-13 MED ORDER — ERGOCALCIFEROL 50 MCG (2000 UT) PO TABS: 1.0000 | ORAL_TABLET | Freq: Every day | ORAL | Status: DC

## 2014-12-13 MED ORDER — CULTURELLE PO CAPS: 1.0000 | ORAL_CAPSULE | Freq: Every day | ORAL | Status: DC

## 2014-12-13 MED ORDER — CYANOCOBALAMIN 1000 MCG/ML IJ SOLN
1000.0000 ug | Freq: Once | INTRAMUSCULAR | Status: AC
  Administered 2014-12-13: 1000 ug via INTRAMUSCULAR

## 2014-12-13 NOTE — Telephone Encounter (Signed)
done

## 2014-12-13 NOTE — Addendum Note (Signed)
Addended by: Crecencio Mc on: 12/13/2014 05:05 PM   Modules accepted: Orders

## 2014-12-13 NOTE — Telephone Encounter (Signed)
Patient came in for visit and wanted to know if you can call in a prescription for Vitamin D and a Probiotic as she gets her prescriptions for free.  Thanks

## 2014-12-13 NOTE — Progress Notes (Signed)
Patient came in for B12 injection.  Received in left deltoid.  Patient tolerated well.

## 2014-12-29 ENCOUNTER — Emergency Department
Admission: EM | Admit: 2014-12-29 | Discharge: 2014-12-29 | Discharge status: Against Medical Advice | Payer: 59 | Attending: Emergency Medicine | Admitting: Emergency Medicine

## 2014-12-29 ENCOUNTER — Encounter: Payer: Self-pay | Admitting: *Deleted

## 2014-12-29 ENCOUNTER — Emergency Department: Payer: 59

## 2014-12-29 DIAGNOSIS — W010XXA Fall on same level from slipping, tripping and stumbling without subsequent striking against object, initial encounter: Secondary | ICD-10-CM | POA: Insufficient documentation

## 2014-12-29 DIAGNOSIS — S79912A Unspecified injury of left hip, initial encounter: Secondary | ICD-10-CM | POA: Diagnosis present

## 2014-12-29 DIAGNOSIS — S93401A Sprain of unspecified ligament of right ankle, initial encounter: Secondary | ICD-10-CM | POA: Insufficient documentation

## 2014-12-29 DIAGNOSIS — Y998 Other external cause status: Secondary | ICD-10-CM | POA: Insufficient documentation

## 2014-12-29 DIAGNOSIS — Y9389 Activity, other specified: Secondary | ICD-10-CM | POA: Insufficient documentation

## 2014-12-29 DIAGNOSIS — Z79899 Other long term (current) drug therapy: Secondary | ICD-10-CM | POA: Diagnosis not present

## 2014-12-29 DIAGNOSIS — F329 Major depressive disorder, single episode, unspecified: Secondary | ICD-10-CM | POA: Insufficient documentation

## 2014-12-29 DIAGNOSIS — Y9289 Other specified places as the place of occurrence of the external cause: Secondary | ICD-10-CM | POA: Diagnosis not present

## 2014-12-29 DIAGNOSIS — S72145A Nondisplaced intertrochanteric fracture of left femur, initial encounter for closed fracture: Secondary | ICD-10-CM | POA: Diagnosis not present

## 2014-12-29 DIAGNOSIS — Z720 Tobacco use: Secondary | ICD-10-CM | POA: Diagnosis not present

## 2014-12-29 DIAGNOSIS — S72002A Fracture of unspecified part of neck of left femur, initial encounter for closed fracture: Secondary | ICD-10-CM

## 2014-12-29 HISTORY — DX: Fracture of unspecified part of neck of unspecified femur, initial encounter for closed fracture: S72.009A

## 2014-12-29 MED ORDER — TRAMADOL HCL 50 MG PO TABS
50.0000 mg | ORAL_TABLET | Freq: Once | ORAL | Status: AC
  Administered 2014-12-29: 50 mg via ORAL
  Filled 2014-12-29: qty 1

## 2014-12-29 MED ORDER — IBUPROFEN 800 MG PO TABS
800.0000 mg | ORAL_TABLET | Freq: Once | ORAL | Status: AC
  Administered 2014-12-29: 800 mg via ORAL
  Filled 2014-12-29: qty 1

## 2014-12-29 MED ORDER — TRAMADOL HCL 50 MG PO TABS: 50.0000 mg | ORAL_TABLET | Freq: Four times a day (QID) | ORAL | Status: DC | PRN

## 2014-12-29 NOTE — ED Provider Notes (Signed)
[facility] Emergency Department Provider Note  ____________________________________________  Time seen: Approximately 7:21 PM  I have reviewed the triage vital signs and the nursing notes.   HISTORY  Chief Complaint Fall    HPI Kathy [name] is a 54 y.o. female patient complaining of left hip and right ankle pain secondary to a trip and fall. Patient states she tripped over a curb and landed on her left hip. Patient state she chipped she also twisted her right ankle causing pain. Patient admits to alcohol use is status post the fall. Patient rates the pain as a 10 over 10 states the pain as sharp when ambulating. No palliative measures taken for this complaint.   Past Medical History  Diagnosis Date  . Hernia of abdominal cavity   . Alcohol abuse   . Depression   . Chicken pox   . Colon polyps   . Hip fracture [facility]) right    Patient Active Problem List   Diagnosis Date Noted  . Hypokalemia 10/13/2014  . Anemia of chronic disease 10/13/2014  . Fatty liver 10/13/2014  . Alcohol dependence with alcohol-induced mood disorder ([place])   . Alcohol withdrawal ([place]) 07/13/2014  . Alcohol dependence ([place]) 07/13/2014  . Alcohol-induced mood disorder ([place]) 07/13/2014  . Acidosis 07/12/2014    No past surgical history on file.  Current Outpatient Rx  Name  Route  Sig  Dispense  Refill  . cetirizine (ZYRTEC) 10 MG tablet   Oral   Take 10 mg by mouth as needed for allergies.         . chlordiazePOXIDE (LIBRIUM) 25 MG capsule   Oral   Take 1 capsule (25 mg total) by mouth 2 (two) times daily. 2 week supply   28 capsule   0   . Ergocalciferol 2000 UNITS TABS   Oral   Take 1 tablet by mouth daily after breakfast.   90 tablet   3   . escitalopram (LEXAPRO) 10 MG tablet   Oral   Take 10 mg by mouth daily.         [name] Ketotifen Fumarate (ALAWAY OP)   Ophthalmic   Apply 1 drop to eye daily. 0.035%         . Lactobacillus Rhamnosus, GG,  (CULTURELLE) CAPS   Oral   Take 1 tablet by mouth daily.   90 capsule   2   . Multiple Vitamin (MULTIVITAMIN WITH MINERALS) TABS tablet   Oral   Take 1 tablet by mouth daily.   30 tablet   11   . Nutritional Supplements (COMPLETE NUTRITION) LIQD   Oral   Take 237 mLs by mouth 2 (two) times daily between meals.   60 Bottle   0   . omeprazole (PRILOSEC) 40 MG capsule   Oral   Take 1 capsule (40 mg total) by mouth daily.   90 capsule   1     Patient will need an appointment prior to next ref ...   . ondansetron (ZOFRAN) 4 MG tablet   Oral   Take 1 tablet (4 mg total) by mouth every 4 (four) hours. As needed for nausea   30 tablet   3     Allergies Aspirin  Family History  Problem Relation Age of Onset  . Hypertension Mother   . Cancer Father 23    prostate    Social History Social History  Substance Use Topics  . Smoking status: Current Every Day Smoker -- 0.50 packs/day  Types: Cigarettes  . Smokeless tobacco: Never Used  . Alcohol Use: 0.0 oz/week    0 Standard drinks or equivalent per week     Comment: Patient states she is cutting back    Review of Systems Constitutional: No fever/chills Eyes: No visual changes. ENT: No sore throat. Cardiovascular: Denies chest pain. Respiratory: Denies shortness of breath. Gastrointestinal: No abdominal pain.  No nausea, no vomiting.  No diarrhea.  No constipation. Genitourinary: Negative for dysuria. Musculoskeletal: Left hip and right ankle pain. Skin: Negative for rash. Neurological: Negative for headaches, focal weakness or numbness. Psychiatric:Depression Hematological/Lymphatic: Allergic/Immunilogical: Aspirin  10-point ROS otherwise negative.  ____________________________________________   PHYSICAL EXAM:  VITAL SIGNS: ED Triage Vitals  Enc Vitals Group     BP 12/29/14 1900 123/76 mmHg     Pulse Rate 12/29/14 1900 110     Resp 12/29/14 1900 18     Temp 12/29/14 1900 97.7 F (36.5 C)      Temp Source 12/29/14 1900 Oral     SpO2 12/29/14 1900 97 %     Weight 12/29/14 1900 113 lb (51.256 kg)     Height 12/29/14 1900 5\' 2"  (1.575 m)     Head Cir --      Peak Flow --      Pain Score 12/29/14 1901 10     Pain Loc --      Pain Edu? --      Excl. in [place]? --     Constitutional: Alert and oriented. Well appearing and in no acute distress. Eyes: Conjunctivae are normal. PERRL. EOMI. Head: Atraumatic. Nose: No congestion/rhinnorhea. Mouth/Throat: Mucous membranes are moist.  Oropharynx non-erythematous. Neck: No stridor.  No cervical spine tenderness to palpation. Hematological/Lymphatic/Immunilogical: No cervical lymphadenopathy. Cardiovascular: Normal rate, regular rhythm. Grossly normal heart sounds.  Good peripheral circulation. Respiratory: Normal respiratory effort.  No retractions. Lungs CTAB. Gastrointestinal: Soft and nontender. No distention. No abdominal bruits. No CVA tenderness. Genitourinary:  Musculoskeletal: No deformities to the left hip. No leg length discrepancy. Patient's tender palpation at the greater trochanter area. Patient decreased range of motion with abduction of the hip. Examination of the right ankle shows no obvious edema or deformity. Patient has some moderate guarding palpation of the lateral malleolus. Patient has atypical gait. Neurologic:  Normal speech and language. No gross focal neurologic deficits are appreciated. No gait instability. Skin:  Skin is warm, dry and intact. No rash noted. Psychiatric: Mood and affect are normal. Speech and behavior are normal.  ____________________________________________   LABS (all labs ordered are listed, but only abnormal results are displayed)  Labs Reviewed - No data to display ____________________________________________  EKG   ____________________________________________  RADIOLOGY  Left hip fracture. I, Sable Feil, personally viewed and evaluated these images (plain radiographs) as part  of my medical decision making.   ____________________________________________   PROCEDURES  Procedure(s) performed: None  Critical Care performed: No  ____________________________________________   INITIAL IMPRESSION / ASSESSMENT AND PLAN / ED COURSE  Pertinent labs & imaging results that were available during my care of the patient were reviewed by me and considered in my medical decision making (see chart for details).  Left hip fracture. Discussed x-ray findings with the patient. Patient refused to be admitted to the hospital. Patient signed out AMA. Discussed orthopedics recommendation to have surgery for her in the morning again she declined Dr. Cinda Quest is also talked to the patient again she has  Decline to be admitted. Patient given a prescription for tramadol. ____________________________________________  FINAL CLINICAL IMPRESSION(S) / ED DIAGNOSES  Final diagnoses:  Fractured hip, left, closed, initial encounter ([place])  Mild sprain of right ankle, initial encounter      Sable Feil, PA-C 12/29/14 2028  The radiologist read it as a intertrochanteric fracture. I should say a talked to the patient myself as noted above the patient refused to stay and she says she broke her hip before and did not stay in the hospital patient walked easily with a slight limp stood on the "fractured" leg while standing on one leg and really did not have any problems she said she had pain in the other ankle than she had in the hip and would not stay in the hospital. She would not have any other tests done either. I asked her to come back if she had any other problems told her that if the hip was indeed fractured all the way through [place] radiologist thought she could end up with a serious injury and in fact the bone could break his to consult through the skin she said that was fine she is going home. Patient walked out without any difficulty. I reviewed the x-rays I am uncertain if she has an  intertrochanteric fracture or fracture of the tip of the greater trochanter. Patient did leave against my medical advice.  Nena Polio, MD 12/30/14 [phone]

## 2014-12-29 NOTE — Discharge Instructions (Signed)
Hip Fracture A hip fracture is a fracture of the upper part of your thigh bone (femur).  CAUSES A hip fracture is caused by a direct blow to the side of your hip. This is usually the result of a fall but can occur in other circumstances, such as an automobile accident. RISK FACTORS There is an increased risk of hip fractures in people with:  An unsteady walking pattern (gait) and those with conditions that contribute to poor balance, such as Parkinson's disease or dementia.  Osteopenia and osteoporosis.  Cancer that spreads to the leg bones.  Certain metabolic diseases. SYMPTOMS  Symptoms of hip fracture include:  Pain over the injured hip.  Inability to put weight on the leg in which the fracture occurred (although, some patients are able to walk after a hip fracture).  Toes and foot of the affected leg point outward when you lie down. DIAGNOSIS A physical exam can determine if a hip fracture is likely to have occurred. X-ray exams are needed to confirm the fracture and to look for other injuries. The X-ray exam can help to determine the type of hip fracture. Rarely, the fracture is not visible on an X-ray image and a CT scan or MRI will have to be done. TREATMENT  The treatment for a fracture is usually surgery. This means using a screw, nail, or rod to hold the bones in place.  HOME CARE INSTRUCTIONS Take all medicines as directed by your health care provider. SEEK MEDICAL CARE IF: Pain continues, even after taking pain medicine. MAKE SURE YOU:  Understand these instructions.   Will watch your condition.  Will get help right away if you are not doing well or get worse.   This information is not intended to replace advice given to you by your health care provider. Make sure you discuss any questions you have with your health care provider.   Document Released: 02/09/2005 Document Revised: 02/14/2013 Document Reviewed: 09/21/2012 Elsevier Interactive Patient Education 2016  Elsevier Inc.  

## 2014-12-29 NOTE — ED Notes (Addendum)
Pt states she wishes to leave AMA. Attending MD spoke with patient and provided education regarding potential risks of leaving AMA. Pt still wants to leave, states she does not want to have surgery and just wants to go home.

## 2014-12-29 NOTE — ED Notes (Signed)
Patient states she tripped over a curb and fell on left hip. Patient c/o left hip and right ankle pain. 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 Patient states she had a ride in to the hospital. Patient walks with a limping gait.

## 2015-01-08 ENCOUNTER — Telehealth: Payer: Self-pay

## 2015-01-08 MED ORDER — TRAMADOL HCL 50 MG PO TABS: 50.0000 mg | ORAL_TABLET | Freq: Four times a day (QID) | ORAL | Status: DC | PRN

## 2015-01-08 NOTE — Telephone Encounter (Signed)
PT REQUESTING A  REFILL ON TRAMADOL

## 2015-01-08 NOTE — Addendum Note (Signed)
Addended by: Crecencio Mc on: 01/08/2015 05:34 PM   Modules accepted: Orders

## 2015-01-08 NOTE — Telephone Encounter (Signed)
Please fax over tonight.  Patient has a fractured hip per review of ER records from Nov 5th and she left AMA!!!  Please get her an appt asap with one of [place]

## 2015-01-09 NOTE — Telephone Encounter (Signed)
Spoke with patient she did leave AMA as she wanted to see you before they do anything to her.  She is still working, slight pain but taking Taking a librium and tramadol daily.  Has an appt with you on the 12th and feels that she can wait until then to see you.  Stated that she fractured her "pelvis" prior and so she has got this.   Please advise.

## 2015-01-10 ENCOUNTER — Encounter: Payer: 59 | Admitting: Internal Medicine

## 2015-01-15 ENCOUNTER — Ambulatory Visit: Payer: 59

## 2015-01-15 ENCOUNTER — Other Ambulatory Visit: Payer: Self-pay

## 2015-01-21 ENCOUNTER — Other Ambulatory Visit: Payer: Self-pay | Admitting: Internal Medicine

## 2015-01-21 NOTE — Telephone Encounter (Signed)
Refill on Zofran 

## 2015-01-21 NOTE — Telephone Encounter (Signed)
Refill for 30 days only.  OFFICE VISIT NEEDED prior to any more refills 

## 2015-01-22 NOTE — Telephone Encounter (Signed)
Error

## 2015-01-29 ENCOUNTER — Other Ambulatory Visit: Payer: Self-pay | Admitting: Internal Medicine

## 2015-01-29 NOTE — Telephone Encounter (Signed)
Pt has cancel several appts are you ok with refilling

## 2015-01-30 NOTE — Telephone Encounter (Signed)
NO,  NO REFILLS UNTIL SHE IS SEEN.  ALSO HAS A FRACTURED HIP AND REFUSED TO SEE ORTHO

## 2015-01-31 ENCOUNTER — Encounter: Payer: 59 | Admitting: Internal Medicine

## 2015-02-04 ENCOUNTER — Ambulatory Visit (INDEPENDENT_AMBULATORY_CARE_PROVIDER_SITE_OTHER): Payer: 59 | Admitting: Internal Medicine

## 2015-02-04 ENCOUNTER — Encounter: Payer: Self-pay | Admitting: Internal Medicine

## 2015-02-04 VITALS — BP 120/80 | HR 100 | Temp 97.7°F | Resp 14 | Ht 61.5 in | Wt 116.5 lb

## 2015-02-04 DIAGNOSIS — D638 Anemia in other chronic diseases classified elsewhere: Secondary | ICD-10-CM

## 2015-02-04 DIAGNOSIS — E559 Vitamin D deficiency, unspecified: Secondary | ICD-10-CM | POA: Diagnosis not present

## 2015-02-04 DIAGNOSIS — R5383 Other fatigue: Secondary | ICD-10-CM

## 2015-02-04 DIAGNOSIS — F411 Generalized anxiety disorder: Secondary | ICD-10-CM | POA: Diagnosis not present

## 2015-02-04 DIAGNOSIS — Z1239 Encounter for other screening for malignant neoplasm of breast: Secondary | ICD-10-CM | POA: Diagnosis not present

## 2015-02-04 DIAGNOSIS — F1024 Alcohol dependence with alcohol-induced mood disorder: Secondary | ICD-10-CM

## 2015-02-04 DIAGNOSIS — S72142A Displaced intertrochanteric fracture of left femur, initial encounter for closed fracture: Secondary | ICD-10-CM

## 2015-02-04 LAB — CBC WITH DIFFERENTIAL/PLATELET
Basophils Absolute: 0.1 10*3/uL (ref 0.0–0.1)
Basophils Relative: 0.5 % (ref 0.0–3.0)
Eosinophils Absolute: 0.1 10*3/uL (ref 0.0–0.7)
Eosinophils Relative: 1 % (ref 0.0–5.0)
HCT: 48 % — ABNORMAL HIGH (ref 36.0–46.0)
Hemoglobin: 15.9 g/dL — ABNORMAL HIGH (ref 12.0–15.0)
Lymphocytes Relative: 26.4 % (ref 12.0–46.0)
Lymphs Abs: 3.7 10*3/uL (ref 0.7–4.0)
MCHC: 33.1 g/dL (ref 30.0–36.0)
MCV: 109.9 fl — ABNORMAL HIGH (ref 78.0–100.0)
Monocytes Absolute: 0.8 10*3/uL (ref 0.1–1.0)
Monocytes Relative: 6.1 % (ref 3.0–12.0)
Neutro Abs: 9.1 10*3/uL — ABNORMAL HIGH (ref 1.4–7.7)
Neutrophils Relative %: 66 % (ref 43.0–77.0)
Platelets: 263 10*3/uL (ref 150.0–400.0)
RBC: 4.37 Mil/uL (ref 3.87–5.11)
RDW: 14 % (ref 11.5–15.5)
WBC: 13.8 10*3/uL — ABNORMAL HIGH (ref 4.0–10.5)

## 2015-02-04 LAB — COMPREHENSIVE METABOLIC PANEL
ALT: 34 U/L (ref 0–35)
AST: 80 U/L — ABNORMAL HIGH (ref 0–37)
Albumin: 3.9 g/dL (ref 3.5–5.2)
Alkaline Phosphatase: 179 U/L — ABNORMAL HIGH (ref 39–117)
BUN: 3 mg/dL — ABNORMAL LOW (ref 6–23)
CO2: 25 mEq/L (ref 19–32)
Calcium: 9.3 mg/dL (ref 8.4–10.5)
Chloride: 98 mEq/L (ref 96–112)
Creatinine, Ser: 0.57 mg/dL (ref 0.40–1.20)
GFR: 117.88 mL/min (ref >60.00)
Glucose, Bld: 99 mg/dL (ref 70–99)
Potassium: 3.7 mEq/L (ref 3.5–5.1)
Sodium: 136 mEq/L (ref 135–145)
Total Bilirubin: 0.5 mg/dL (ref 0.2–1.2)
Total Protein: 7.4 g/dL (ref 6.0–8.3)

## 2015-02-04 LAB — VITAMIN D 25 HYDROXY (VIT D DEFICIENCY, FRACTURES): VITD: 29.73 ng/mL — ABNORMAL LOW (ref 30.00–100.00)

## 2015-02-04 MED ORDER — BUSPIRONE HCL 7.5 MG PO TABS: 7.5000 mg | ORAL_TABLET | Freq: Three times a day (TID) | ORAL | Status: DC

## 2015-02-04 MED ORDER — CYANOCOBALAMIN 1000 MCG/ML IJ SOLN
1000.0000 ug | Freq: Once | INTRAMUSCULAR | Status: AC
  Administered 2015-02-04: 1000 ug via INTRAMUSCULAR

## 2015-02-04 MED ORDER — PAROXETINE HCL ER 12.5 MG PO TB24: 12.5000 mg | ORAL_TABLET | Freq: Every day | ORAL | Status: DC

## 2015-02-04 NOTE — Progress Notes (Signed)
Pre-visit discussion using our clinic review tool. No additional management support is needed unless otherwise documented below in the visit note.  

## 2015-02-04 NOTE — Patient Instructions (Addendum)
I am prescribing two medications for your anxiety:  Paxil CR:  Take once daily,  Either AM or PM,  Whichever agrees with you more  Buspirone take up to three times daily for anxiety an insomnia  Return in one month!!  Mammogram,  Orthopedics referral to Dr [name] Heck,  and Bone Density Test are all in process   Labs today to determine how much Vitamin  D you need

## 2015-02-04 NOTE — Progress Notes (Signed)
Subjective:  Patient ID: Kathy [name], female    DOB: 07-Apr-1960  Age: 54 y.o. MRN: [id number]  CC: The primary encounter diagnosis was Generalized anxiety disorder. Diagnoses of Intertrochanteric fracture of left hip, closed, initial encounter [facility]), Breast cancer screening, Vitamin D deficiency, Other fatigue, Anemia of chronic disease, Alcohol dependence with alcohol-induced mood disorder ([place]), and Intertrochanteric fracture of left femur, closed, initial encounter [facility]) were also pertinent to this visit.  HPI Kathy [name] presents for her annual comprehensive exam with PAP smear ,  But patient suffered a traumatic left intertrochangeric fracture during  A fall on Nov 5th,  And has not had Er follow up or Orthopedics evaluation ,  per patient preference. The fracture occurred after tripping and falling over a curb.  She states that she was not inebriated at the time.  She refused narcotics including tramadol,  And has been using advil only.     She has a history of right sided pelvic fracture which occurred 12 years ago while vacationing at [employer].  She fell down a flight of stairs while carrying things in both hands.  She was not inebriated at time time either, although she reffly states that she is an alcoholic and has been drinking occasionally since her first visit with me several months ago.    Prior Orthopedics evaluation for pelvic fracture was evaluated by Kathy Leys, MD and per patient she did  not need surgery, so she has assumed she would not need surgery now.  .   Last DEXA 13 years ago    Last colonoscopy 2012, presumed.   4 precancerous polyps found by Dr. Welton [name] 5 yr follow up due next year ?  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  Last refill denied due to loss to follow up.  Wants an alternative.   Mother has been taking a medication  for "nerves"  But not sure what it is.  Patinet reports that she wakes up in a nervous wreck ,  Feeling in the pit of her stomach.  Prior trial of lexapro did not help  Has been drinking alcohol ,  Not daily.  States that her   maximum daily intake is  3-4 cocktails or glasses of wine.  Drinking more   because I refused to refill her librium since the initial prescription in august.    At her first visit she was given a two week supply  Of librium and asked to return in 2 weeks.   She failed to return for 2 week follow up, but was seen by psychology and referred back to me.  Discussed the role of librium as a short term medication to be used for alcohol detoxification,  Not a chronic medication   Joint pain:  Reports that she has been having bilateral knee pain and bilateral altered sensation in her feet,  whivch feel both hot and cold  At times.    Outpatient Prescriptions Prior to Visit  Medication Sig Dispense Refill  . Ergocalciferol 2000 UNITS TABS Take 1 tablet by mouth daily after breakfast. 90 tablet 3  . Lactobacillus Rhamnosus, GG, (CULTURELLE) CAPS Take 1 tablet by mouth daily. 90 capsule 2  . Multiple Vitamin (MULTIVITAMIN WITH MINERALS) TABS tablet Take 1 tablet by mouth daily. 30 tablet 11  . omeprazole (PRILOSEC) 40 MG capsule  Take 1 capsule (40 mg total) by mouth daily. 90 capsule 1  . ondansetron (ZOFRAN) 4 MG tablet TAKE ONE TABLET EVERY 4 HOURS AS NEEDED FOR NAUSEA 30 tablet 0  . cetirizine (ZYRTEC) 10 MG tablet Take 10 mg by mouth as needed for allergies.    . chlordiazePOXIDE (LIBRIUM) 25 MG capsule Take 1 capsule (25 mg total) by mouth 2 (two) times daily. 2 week supply (Patient not taking: Reported on 02/04/2015) 28 capsule 0  . Ketotifen Fumarate (ALAWAY OP) Apply 1 drop to eye daily. 0.035%    . Nutritional Supplements (COMPLETE NUTRITION) LIQD Take 237 mLs by mouth 2 (two) times daily between meals. (Patient not taking: Reported on 02/04/2015) 60 Bottle 0  . escitalopram  (LEXAPRO) 10 MG tablet Take 10 mg by mouth daily.    . traMADol (ULTRAM) 50 MG tablet Take 1 tablet (50 mg total) by mouth every 6 (six) hours as needed for moderate pain. (Patient not taking: Reported on 02/04/2015) 90 tablet 0   No facility-administered medications prior to visit.    Review of Systems;  Patient denies headache, fevers, malaise, unintentional weight loss, skin rash, eye pain, sinus congestion and sinus pain, sore throat, dysphagia,  hemoptysis , cough, dyspnea, wheezing, chest pain, palpitations, orthopnea, edema, abdominal pain, nausea, melena, diarrhea, constipation, flank pain, dysuria, hematuria, urinary  Frequency, nocturia, numbness, tingling, seizures,  Focal weakness, Loss of consciousness,  Tremor, insomnia, depression, anxiety, and suicidal ideation.      Objective:  BP 120/80 mmHg  Pulse 100  Temp(Src) 97.7 F (36.5 C) (Oral)  Resp 14  Ht 5' 1.5" (1.562 m)  Wt 116 lb 8 oz (52.844 kg)  BMI 21.66 kg/m2  SpO2 97%  BP Readings from Last 3 Encounters:  02/04/15 120/80  12/29/14 123/76  10/11/14 102/68    Wt Readings from Last 3 Encounters:  02/04/15 116 lb 8 oz (52.844 kg)  12/29/14 113 lb (51.256 kg)  10/11/14 117 lb (53.071 kg)    General appearance: alert, cooperative and appears stated age Ears: normal TM's and external ear canals both ears Throat: lips, mucosa, and tongue normal; teeth and gums normal Neck: no adenopathy, no carotid bruit, supple, symmetrical, trachea midline and thyroid not enlarged, symmetric, no tenderness/mass/nodules Back: symmetric, no curvature. ROM normal. No CVA tenderness. Lungs: clear to auscultation bilaterally Heart: regular rate and rhythm, S1, S2 normal, no murmur, click, rub or gallop Abdomen: soft, non-tender; bowel sounds normal; no masses,  no organomegaly Pulses: 2+ and symmetric Skin: Skin color, texture, turgor normal. No rashes or lesions Lymph nodes: Cervical, supraclavicular, and axillary nodes  normal.  Lab Results  Component Value Date   HGBA1C 5.5 10/11/2014    Lab Results  Component Value Date   CREATININE 0.57 02/04/2015   CREATININE 0.46 10/11/2014   CREATININE 0.48 07/16/2014    Lab Results  Component Value Date   WBC 13.8* 02/04/2015   HGB 15.9* 02/04/2015   HCT 48.0* 02/04/2015   PLT 263.0 02/04/2015   GLUCOSE 99 02/04/2015   CHOL 193 10/11/2014   TRIG 282.0* 10/11/2014   HDL 45.30 10/11/2014   LDLDIRECT 119.0 10/11/2014   ALT 34 02/04/2015   AST 80* 02/04/2015   NA 136 02/04/2015   K 3.7 02/04/2015   CL 98 02/04/2015   CREATININE 0.57 02/04/2015   BUN 3* 02/04/2015   CO2 25 02/04/2015   TSH 1.41 10/11/2014   HGBA1C 5.5 10/11/2014    Dg Ankle Complete Right  12/29/2014  CLINICAL DATA:  Right ankle pain beginning after tripping over a curb. Patient can bear weight. EXAM: RIGHT ANKLE - COMPLETE 3+ VIEW COMPARISON:  None. FINDINGS: No fracture.  No bone lesion. The ankle mortise is normally spaced and aligned. There is a small plantar calcaneal spur. Soft tissues are unremarkable. IMPRESSION: No fracture or ankle joint abnormality. Electronically Signed   By: Lajean Manes M.D.   On: 12/29/2014 19:56   Dg Hip Unilat With Pelvis 2-3 Views Left  12/29/2014  CLINICAL DATA:  Left hip pain after tripping ovary curb today and falling. EXAM: DG HIP (WITH OR WITHOUT PELVIS) 2-3V LEFT COMPARISON:  None. FINDINGS: Diffuse osteopenia. Essentially nondisplaced left intertrochanteric fracture without angulation. This is suboptimally visualized due to the diffuse osteopenia and overlapping skin folds. Lower lumbar spine degenerative changes. IMPRESSION: Essentially nondisplaced left intertrochanteric fracture. This could be better delineated and confirmed with CT if clinically indicated. Electronically Signed   By: Claudie Revering M.D.   On: 12/29/2014 19:58    Assessment & Plan:   Problem List Items Addressed This Visit    Alcohol dependence with alcohol-induced mood  disorder ([place])    Continue AA,.  Starting Paxil and buspirone for daily use. She has been  drinking daily per patient report.  Given loss to follow up after last visit, will not refill librium       Intertrochanteric fracture of left femur [facility])    Occurred on Nov 5th, patient refused Ortho referral at that time or follow up,  Referring now to Dr Kathy [name].   DEXA scan ordered,  Vit D ordered.       Anemia of chronic disease   Relevant Medications   cyanocobalamin ((VITAMIN B-12)) injection 1,000 mcg (Completed)    Other Visit Diagnoses    Generalized anxiety disorder    -  Primary    Relevant Medications    PARoxetine (PAXIL CR) 12.5 MG 24 hr tablet    busPIRone (BUSPAR) 7.5 MG tablet    Intertrochanteric fracture of left hip, closed, initial encounter ([place])        Relevant Orders    Ambulatory referral to Orthopedic Surgery    DG Bone Density    Breast cancer screening        Relevant Orders    MM DIGITAL SCREENING BILATERAL    Vitamin D deficiency        Relevant Orders    VITAMIN D 25 Hydroxy (Vit-D Deficiency, Fractures) (Completed)    Other fatigue        Relevant Orders    Comp Met (CMET) (Completed)    CBC with Differential/Platelet (Completed)     A total of 40 minutes was spent with patient more than half of which was spent in counseling patient on the above mentioned issues , reviewing and explaining recent labs and imaging studies done, and coordination of care.   I have discontinued Ms. Woolum's escitalopram and traMADol. I am also having her start on PARoxetine and busPIRone. Additionally, I am having her maintain her cetirizine, Ketotifen Fumarate (ALAWAY OP), COMPLETE NUTRITION, multivitamin with minerals, chlordiazePOXIDE, omeprazole, Ergocalciferol, CULTURELLE, and ondansetron. We administered cyanocobalamin.  Meds ordered this encounter  Medications  . PARoxetine (PAXIL CR) 12.5 MG 24 hr tablet    Sig: Take 1 tablet (12.5 mg total) by mouth daily.     Dispense:  30 tablet    Refill:  2  . busPIRone (BUSPAR) 7.5 MG tablet    Sig: Take 1 tablet (7.5 mg total) by mouth  3 (three) times daily.    Dispense:  90 tablet    Refill:  1  . cyanocobalamin ((VITAMIN B-12)) injection 1,000 mcg    Sig:     Medications Discontinued During This Encounter  Medication Reason  . traMADol (ULTRAM) 50 MG tablet   . escitalopram (LEXAPRO) 10 MG tablet     Follow-up: Return in about 4 weeks (around 03/04/2015).   Crecencio Mc, MD

## 2015-02-05 DIAGNOSIS — Z8781 Personal history of (healed) traumatic fracture: Secondary | ICD-10-CM | POA: Insufficient documentation

## 2015-02-05 NOTE — Assessment & Plan Note (Signed)
Continue AA,.  Starting Paxil and buspirone for daily use. She has been  drinking daily per patient report.  Given loss to follow up after last visit, will not refill librium

## 2015-02-05 NOTE — Assessment & Plan Note (Addendum)
Occurred on Nov 5th, patient refused Ortho referral at that time or follow up,  Referring now to Dr Earnestine Leys.   DEXA scan ordered,  Vit D ordered.

## 2015-02-07 MED ORDER — ERGOCALCIFEROL 1.25 MG (50000 UT) PO CAPS: 50000.0000 [IU] | ORAL_CAPSULE | ORAL | Status: DC

## 2015-02-07 NOTE — Addendum Note (Signed)
Addended by: Crecencio Mc on: 02/07/2015 07:05 AM   Modules accepted: Orders, Medications, SmartSet

## 2015-02-19 ENCOUNTER — Other Ambulatory Visit: Payer: Self-pay | Admitting: Internal Medicine

## 2015-02-19 NOTE — Telephone Encounter (Signed)
Please advise? Refilled on 01/21/15 with no refills.

## 2015-02-19 NOTE — Telephone Encounter (Signed)
Ok to refill,  Refill sent  

## 2015-03-04 ENCOUNTER — Other Ambulatory Visit: Payer: 59

## 2015-03-04 ENCOUNTER — Ambulatory Visit
Admission: RE | Admit: 2015-03-04 | Discharge: 2015-03-04 | Disposition: A | Discharge status: Home / Self Care | Payer: 59 | Source: Ambulatory Visit | Attending: Internal Medicine | Admitting: Internal Medicine

## 2015-03-04 DIAGNOSIS — Z1239 Encounter for other screening for malignant neoplasm of breast: Secondary | ICD-10-CM

## 2015-03-22 ENCOUNTER — Other Ambulatory Visit: Payer: Self-pay | Admitting: Internal Medicine

## 2015-03-22 NOTE — Telephone Encounter (Signed)
Lab note stated patient was only to take for 4 weeks for a borderline Vitamin D?

## 2015-03-22 NOTE — Telephone Encounter (Signed)
Her last level was low..  Refilled for 4 weeks plus 2 refills given her recent hip fracture

## 2015-03-26 ENCOUNTER — Ambulatory Visit (INDEPENDENT_AMBULATORY_CARE_PROVIDER_SITE_OTHER): Payer: BLUE CROSS/BLUE SHIELD | Admitting: *Deleted

## 2015-03-26 DIAGNOSIS — D638 Anemia in other chronic diseases classified elsewhere: Secondary | ICD-10-CM | POA: Diagnosis not present

## 2015-03-26 MED ORDER — CYANOCOBALAMIN 1000 MCG/ML IJ SOLN
1000.0000 ug | Freq: Once | INTRAMUSCULAR | Status: AC
  Administered 2015-03-26: 1000 ug via INTRAMUSCULAR

## 2015-03-26 NOTE — Progress Notes (Signed)
Patient presented for B 12  injection to right deltoid patient tolerated well. 

## 2015-03-26 NOTE — Progress Notes (Signed)
Pre-visit discussion using our clinic review tool. No additional management support is needed unless otherwise documented below in the visit note.  

## 2015-04-12 ENCOUNTER — Ambulatory Visit: Payer: Self-pay | Admitting: Internal Medicine

## 2015-04-22 ENCOUNTER — Encounter: Payer: Self-pay | Admitting: Internal Medicine

## 2015-04-22 ENCOUNTER — Other Ambulatory Visit (HOSPITAL_COMMUNITY)
Admission: RE | Admit: 2015-04-22 | Discharge: 2015-04-22 | Disposition: A | Discharge status: Home / Self Care | Payer: BLUE CROSS/BLUE SHIELD | Source: Ambulatory Visit | Attending: Internal Medicine | Admitting: Internal Medicine

## 2015-04-22 ENCOUNTER — Ambulatory Visit (INDEPENDENT_AMBULATORY_CARE_PROVIDER_SITE_OTHER): Payer: BLUE CROSS/BLUE SHIELD | Admitting: Internal Medicine

## 2015-04-22 VITALS — BP 120/78 | HR 103 | Temp 98.0°F | Resp 14 | Ht 62.0 in | Wt 112.5 lb

## 2015-04-22 DIAGNOSIS — Z01419 Encounter for gynecological examination (general) (routine) without abnormal findings: Secondary | ICD-10-CM | POA: Diagnosis present

## 2015-04-22 DIAGNOSIS — G2581 Restless legs syndrome: Secondary | ICD-10-CM | POA: Diagnosis not present

## 2015-04-22 DIAGNOSIS — G629 Polyneuropathy, unspecified: Secondary | ICD-10-CM

## 2015-04-22 DIAGNOSIS — Z0000 Encounter for general adult medical examination without abnormal findings: Secondary | ICD-10-CM | POA: Diagnosis not present

## 2015-04-22 DIAGNOSIS — F1024 Alcohol dependence with alcohol-induced mood disorder: Secondary | ICD-10-CM

## 2015-04-22 DIAGNOSIS — Z23 Encounter for immunization: Secondary | ICD-10-CM

## 2015-04-22 DIAGNOSIS — Z124 Encounter for screening for malignant neoplasm of cervix: Secondary | ICD-10-CM

## 2015-04-22 DIAGNOSIS — E538 Deficiency of other specified B group vitamins: Secondary | ICD-10-CM | POA: Diagnosis not present

## 2015-04-22 DIAGNOSIS — F411 Generalized anxiety disorder: Secondary | ICD-10-CM | POA: Diagnosis not present

## 2015-04-22 DIAGNOSIS — S72142D Displaced intertrochanteric fracture of left femur, subsequent encounter for closed fracture with routine healing: Secondary | ICD-10-CM

## 2015-04-22 DIAGNOSIS — E162 Hypoglycemia, unspecified: Secondary | ICD-10-CM | POA: Diagnosis not present

## 2015-04-22 DIAGNOSIS — Z1151 Encounter for screening for human papillomavirus (HPV): Secondary | ICD-10-CM | POA: Insufficient documentation

## 2015-04-22 DIAGNOSIS — Z720 Tobacco use: Secondary | ICD-10-CM | POA: Diagnosis not present

## 2015-04-22 DIAGNOSIS — K760 Fatty (change of) liver, not elsewhere classified: Secondary | ICD-10-CM

## 2015-04-22 DIAGNOSIS — D751 Secondary polycythemia: Secondary | ICD-10-CM

## 2015-04-22 DIAGNOSIS — E876 Hypokalemia: Secondary | ICD-10-CM

## 2015-04-22 LAB — CBC WITH DIFFERENTIAL/PLATELET
Basophils Absolute: 0 10*3/uL (ref 0.0–0.1)
Basophils Relative: 0.4 % (ref 0.0–3.0)
Eosinophils Absolute: 0.1 10*3/uL (ref 0.0–0.7)
Eosinophils Relative: 0.5 % (ref 0.0–5.0)
HCT: 49.4 % — ABNORMAL HIGH (ref 36.0–46.0)
Hemoglobin: 16.8 g/dL — ABNORMAL HIGH (ref 12.0–15.0)
Lymphocytes Relative: 30.9 % (ref 12.0–46.0)
Lymphs Abs: 3.6 10*3/uL (ref 0.7–4.0)
MCHC: 33.9 g/dL (ref 30.0–36.0)
MCV: 108 fl — ABNORMAL HIGH (ref 78.0–100.0)
Monocytes Absolute: 0.7 10*3/uL (ref 0.1–1.0)
Monocytes Relative: 5.7 % (ref 3.0–12.0)
Neutro Abs: 7.2 10*3/uL (ref 1.4–7.7)
Neutrophils Relative %: 62.5 % (ref 43.0–77.0)
Platelets: 224 10*3/uL (ref 150.0–400.0)
RBC: 4.57 Mil/uL (ref 3.87–5.11)
RDW: 14.6 % (ref 11.5–15.5)
WBC: 11.6 10*3/uL — ABNORMAL HIGH (ref 4.0–10.5)

## 2015-04-22 LAB — BASIC METABOLIC PANEL
BUN: 5 mg/dL — ABNORMAL LOW (ref 6–23)
CO2: 27 mEq/L (ref 19–32)
Calcium: 9.8 mg/dL (ref 8.4–10.5)
Chloride: 95 mEq/L — ABNORMAL LOW (ref 96–112)
Creatinine, Ser: 0.44 mg/dL (ref 0.40–1.20)
GFR: 158.79 mL/min (ref >60.00)
Glucose, Bld: 95 mg/dL (ref 70–99)
Potassium: 4.4 mEq/L (ref 3.5–5.1)
Sodium: 136 mEq/L (ref 135–145)

## 2015-04-22 LAB — HEMOGLOBIN A1C: Hgb A1c MFr Bld: 5.3 % (ref 4.6–6.5)

## 2015-04-22 LAB — IRON AND TIBC
%SAT: 51 % — ABNORMAL HIGH (ref 11–50)
Iron: 176 ug/dL — ABNORMAL HIGH (ref 45–160)
TIBC: 343 ug/dL (ref 250–450)
UIBC: 167 ug/dL (ref 125–400)

## 2015-04-22 LAB — FERRITIN: Ferritin: 384.1 ng/mL — ABNORMAL HIGH (ref 10.0–291.0)

## 2015-04-22 MED ORDER — PROPRANOLOL HCL ER 60 MG PO CP24: 60.0000 mg | ORAL_CAPSULE | Freq: Every day | ORAL | Status: DC

## 2015-04-22 MED ORDER — SERTRALINE HCL 50 MG PO TABS
50.0000 mg | ORAL_TABLET | Freq: Every day | ORAL | Status: DC
Start: 2015-04-22 — End: 2015-05-09

## 2015-04-22 MED ORDER — CYANOCOBALAMIN 1000 MCG/ML IJ SOLN
1000.0000 ug | Freq: Once | INTRAMUSCULAR | Status: AC
  Administered 2015-04-22: 1000 ug via INTRAMUSCULAR

## 2015-04-22 MED ORDER — OMEPRAZOLE 40 MG PO CPDR: 40.0000 mg | DELAYED_RELEASE_CAPSULE | Freq: Two times a day (BID) | ORAL | Status: DC

## 2015-04-22 MED ORDER — VITAMIN D (ERGOCALCIFEROL) 1.25 MG (50000 UNIT) PO CAPS: 50000.0000 [IU] | ORAL_CAPSULE | ORAL | Status: DC

## 2015-04-22 MED ORDER — GABAPENTIN 100 MG PO CAPS: 100.0000 mg | ORAL_CAPSULE | Freq: Three times a day (TID) | ORAL | Status: DC

## 2015-04-22 MED ORDER — ONDANSETRON HCL 4 MG PO TABS: ORAL_TABLET | ORAL | Status: DC

## 2015-04-22 NOTE — Progress Notes (Signed)
Pre-visit discussion using our clinic review tool. No additional management support is needed unless otherwise documented below in the visit note.  

## 2015-04-22 NOTE — Progress Notes (Addendum)
Patient ID: Kathy [name], female    DOB: [birth year]  Age: 55 y.o. MRN: [id number]  The patient is here for annual  wellness examination and management of other chronic and acute problems.   The risk factors are reflected in the social history and updated in the HPI.  The roster of all physicians providing medical care to patient - is listed in the Snapshot section of the chart.  Activities of daily living:  The patient is 100% independent in all ADLs: dressing, toileting, feeding as well as independent mobility  Home safety : The patient has smoke detectors in the home. They wear seatbelts.  There are no firearms at home. There is no violence in the home.   There is no risks for hepatitis, STDs or HIV. There is no   history of blood transfusion. They have no travel history to infectious disease endemic areas of the world.  The patient has seen their dentist in the last six month. They have seen their eye doctor in the last year. They admit to slight hearing difficulty with regard to whispered voices and some television programs.  They have deferred audiologic testing in the last year.  They do not  have excessive sun exposure. Discussed the need for sun protection: hats, long sleeves and use of sunscreen if there is significant sun exposure.   Diet: the importance of a healthy diet is discussed. They do have a healthy diet.  The benefits of regular aerobic exercise were discussed. She walks 4 times per week ,  20 minutes.   Depression screen: there are no signs or vegative symptoms of depression- irritability, change in appetite, anhedonia, sadness/tearfullness.  Cognitive assessment: the patient manages all their financial and personal affairs and is actively engaged. They could relate day,date,year and events; recalled 2/3 objects at 3 minutes; performed clock-face test normally.  The following portions of the patient's history were reviewed and updated as appropriate: allergies,  current medications, past family history, past medical history,  past surgical history, past social history  and problem list.  Visual acuity was not assessed per patient preference since she has regular follow up with her ophthalmologist. Hearing and body mass index were assessed and reviewed.   During the course of the visit the patient was educated and counseled about appropriate screening and preventive services including : fall prevention , diabetes screening, nutrition counseling, colorectal cancer screening, and recommended immunizations.    CC: The primary encounter diagnosis was GAD (generalized anxiety disorder). Diagnoses of Hypoglycemia, Restless legs, Neuropathy ([place]), Screening for cervical cancer, B12 deficiency, Fatty liver, Alcohol dependence with alcohol-induced mood disorder ([place]), Polycythemia, secondary, Intertrochanteric fracture of left femur, closed, with routine healing, subsequent encounter, Hypokalemia, Encounter for preventive health examination, and Continuous tobacco abuse were also pertinent to this visit.  1) F/U on mninmally displace avulsion type Left hip fracture,  Saw Ortho Earnestine Leys  dec 15.  No surgery needed,.  Follow up with ortho in 6 weeks was advised but not done bc she has had resolution of hip pain. Has resumed exercising and working .  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   Has been progressing  Since December , started in her toes and now up to her knees .  Disrupting her sleep but not her daytime activities.  getrting monthly b12 injections here   3) Alcohol abuse:  Has reduced daily consumption from 6 drinks to 3 .    4) Anxiety:  She did not feel theat the  previously prescribed medication, Paxil not helping anxiety so she self weaned. She is using bupropion prn at the  7.5 mg dose and averages one time dailyue  bc it makes her sleepy.  History of failure with lexapro,   No prior trial of zoloft.  Having panic attacks that wake her up wiith heart  racing ,  Says it feels like  strage fright .    5) Tobacco abuse : Has reduced cigarette use to  1/7 pack daily  (limits herself to a pack weekly)    6) Legs feel restless at night   Has not checked iron     History Pooja has a past medical history of Hernia of abdominal cavity; Alcohol abuse; Depression; Chicken pox; Colon polyps; and Hip fracture ([place]) (right).   She has no past surgical history on file.   Her family history includes Cancer (age of onset: 22) in her father; Hypertension in her mother.She reports that she has been smoking Cigarettes.  She has been smoking about 0.15 packs per day. She has never used smokeless tobacco. She reports that she drinks about 12.6 oz of alcohol per week. She reports that she does not use illicit drugs.  Outpatient Prescriptions Prior to Visit  Medication Sig Dispense Refill  . busPIRone (BUSPAR) 7.5 MG tablet Take 1 tablet (7.5 mg total) by mouth 3 (three) times daily. 90 tablet 1  . cetirizine (ZYRTEC) 10 MG tablet Take 10 mg by mouth as needed for allergies.    . Ketotifen Fumarate (ALAWAY OP) Apply 1 drop to eye daily. 0.035%    . Lactobacillus Rhamnosus, GG, (CULTURELLE) CAPS Take 1 tablet by mouth daily. 90 capsule 2  . Multiple Vitamin (MULTIVITAMIN WITH MINERALS) TABS tablet Take 1 tablet by mouth daily. 30 tablet 11  . omeprazole (PRILOSEC) 40 MG capsule Take 1 capsule (40 mg total) by mouth daily. 90 capsule 1  . ondansetron (ZOFRAN) 4 MG tablet TAKE ONE TABLET BY MOUTH EVERY 4 HOURS AS NEEDED FOR NAUSEA 30 tablet 5  . Vitamin D, Ergocalciferol, (DRISDOL) 50000 units CAPS capsule TAKE ONE CAPSULE EVERY WEEK 4 capsule 2  . PARoxetine (PAXIL CR) 12.5 MG 24 hr tablet Take 1 tablet (12.5 mg total) by mouth daily. (Patient not taking: Reported on 04/22/2015) 30 tablet 2   No facility-administered medications prior to visit.    Review of Systems  Objective:  BP 120/78 mmHg  Pulse 103  Temp(Src) 98 F (36.7 C) (Oral)  Resp 14  Ht  5\' 2"  (1.575 m)  Wt 112 lb 8 oz (51.03 kg)  BMI 20.57 kg/m2  SpO2 99%  Physical Exam  General Appearance:    Alert, cooperative, no distress, appears stated age  Head:    Normocephalic, without obvious abnormality, atraumatic  Eyes:    PERRL, conjunctiva/corneas clear, EOM's intact, fundi    benign, both eyes  Ears:    Normal TM's and external ear canals, both ears  Nose:   Nares normal, septum midline, mucosa normal, no drainage    or sinus tenderness  Throat:   Lips, mucosa, and tongue normal; teeth and gums normal  Neck:   Supple, symmetrical, trachea midline, no adenopathy;    thyroid:  no enlargement/tenderness/nodules; no carotid   bruit or JVD  Back:     Symmetric, no curvature, ROM normal, no CVA tenderness  Lungs:     Clear to auscultation bilaterally, respirations unlabored  Chest Wall:    No tenderness or deformity   Heart:  Regular rate and rhythm, S1 and S2 normal, no murmur, rub   or gallop  Breast Exam:    No tenderness, masses, or nipple abnormality  Abdomen:     Soft, non-tender, bowel sounds active all four quadrants,    no masses, no organomegaly  Genitalia:    Pelvic: cervix normal in appearance, external genitalia normal, no adnexal masses or tenderness, no cervical motion tenderness, rectovaginal septum normal, uterus normal size, shape, and consistency and vagina normal without discharge  Extremities:   Extremities normal, atraumatic, no cyanosis or edema  Pulses:   2+ and symmetric all extremities  Skin:   Skin color, texture, turgor normal, no rashes or lesions  Lymph nodes:   Cervical, supraclavicular, and axillary nodes normal  Neurologic:   CNII-XII intact, normal strength, sensation and reflexes    throughout    Assessment & Plan:   Problem List Items Addressed This Visit    Alcohol dependence with alcohol-induced mood disorder ([place])    Congratulated and encouraged to continue reducing daily use of alcohol.  Adding zoloft for anxiety and Inderal  LA for panic attacks. .  Avoiding benzodiazepines.  Continue buspirone. Prn        Hypokalemia    Resolved,  And mg was normal.  Advised to stop potassium supplement  Lab Results  Component Value Date   NA 136 04/22/2015   K 4.4 04/22/2015   CL 95* 04/22/2015   CO2 27 04/22/2015         Polycythemia, secondary    Secondary to tobacco abuse,  Normal iron stores given report of RLS symptoms  Lab Results  Component Value Date   WBC 11.6* 04/22/2015   HGB 16.8* 04/22/2015   HCT 49.4* 04/22/2015   MCV 108.0* 04/22/2015   PLT 224.0 04/22/2015     Lab Results  Component Value Date   IRON 176* 04/22/2015   TIBC 343 04/22/2015   FERRITIN 384.1* 04/22/2015         Fatty liver    Secondary to alcohol abuse, with persistent liver enzyme elevation. Hep a and b vaccines given/completed today  Lab Results  Component Value Date   ALT 34 02/04/2015   AST 80* 02/04/2015   ALKPHOS 179* 02/04/2015   BILITOT 0.5 02/04/2015         Relevant Orders   Hepatitis A hepatitis B combined vaccine IM (Completed)   Intertrochanteric fracture of left femur ([facility])    Pain has resolved,  No surgery recommended per Earnestine Leys.  DEXA still needed, poatient postponed due to snow       Encounter for preventive health examination    Annual comprehensive preventive exam was done as well as an evaluation and management of chronic conditions .  During the course of the visit the patient was educated and counseled about appropriate screening and preventive services including :  diabetes screening, lipid analysis with projected  10 year  risk for CAD , nutrition counseling, breast, cervical and colorectal cancer screening, and recommended immunizations.  Printed recommendations for health maintenance screenings was given, and ct chest was advised given persistent tobacco abuse.       Relevant Orders   CT CHEST LUNG CA SCREEN LOW DOSE W/O CM   Neuropathy ([place])    By history of symptoms , likely  secondary to alcoholism.  Trial of gabapentin       Relevant Orders   RPR (Completed)   Basic metabolic panel (Completed)    Other Visit Diagnoses  GAD (generalized anxiety disorder)    -  Primary    Relevant Medications    sertraline (ZOLOFT) 50 MG tablet    Hypoglycemia        Relevant Orders    Hemoglobin A1c (Completed)    Restless legs        Relevant Orders    CBC with Differential/Platelet (Completed)    Ferritin (Completed)    Iron and TIBC (Completed)    Screening for cervical cancer        Relevant Orders    Cytology - PAP    B12 deficiency        Relevant Medications    cyanocobalamin ((VITAMIN B-12)) injection 1,000 mcg (Completed)    Continuous tobacco abuse        Relevant Orders    CT CHEST LUNG CA SCREEN LOW DOSE W/O CM       I have discontinued Ms. Yazdani's PARoxetine. I have also changed her Vitamin D (Ergocalciferol) and omeprazole. Additionally, I am having her start on gabapentin, sertraline, and propranolol ER. Lastly, I am having her maintain her cetirizine, Ketotifen Fumarate (ALAWAY OP), multivitamin with minerals, CULTURELLE, busPIRone, and ondansetron. We administered cyanocobalamin.  Meds ordered this encounter  Medications  . ondansetron (ZOFRAN) 4 MG tablet    Sig: TAKE ONE TABLET BY MOUTH EVERY 4 HOURS AS NEEDED FOR NAUSEA    Dispense:  30 tablet    Refill:  5  . Vitamin D, Ergocalciferol, (DRISDOL) 50000 units CAPS capsule    Sig: Take 1 capsule (50,000 Units total) by mouth every 7 (seven) days.    Dispense:  12 capsule    Refill:  2  . gabapentin (NEURONTIN) 100 MG capsule    Sig: Take 1 capsule (100 mg total) by mouth 3 (three) times daily.    Dispense:  90 capsule    Refill:  3  . sertraline (ZOLOFT) 50 MG tablet    Sig: Take 1 tablet (50 mg total) by mouth daily.    Dispense:  90 tablet    Refill:  1  . propranolol ER (INDERAL LA) 60 MG 24 hr capsule    Sig: Take 1 capsule (60 mg total) by mouth daily.    Dispense:  30  capsule    Refill:  2  . omeprazole (PRILOSEC) 40 MG capsule    Sig: Take 1 capsule (40 mg total) by mouth 2 (two) times daily.    Dispense:  180 capsule    Refill:  1  . cyanocobalamin ((VITAMIN B-12)) injection 1,000 mcg    Sig:     Medications Discontinued During This Encounter  Medication Reason  . ondansetron (ZOFRAN) 4 MG tablet Reorder  . Vitamin D, Ergocalciferol, (DRISDOL) 50000 units CAPS capsule Reorder  . PARoxetine (PAXIL CR) 12.5 MG 24 hr tablet   . omeprazole (PRILOSEC) 40 MG capsule Reorder    Follow-up: No Follow-up on file.   Crecencio Mc, MD

## 2015-04-22 NOTE — Patient Instructions (Addendum)
I am Starting you on gabapentin for your neuropathy. You can start with 1 tablet at bedtime , and increase your dose every few days by 1 pill until you are taking 300 mg daily    I am changing paxil to zoloft for your generalized anxiety.  Start the dose with 1/2 of a 50 mg tablet for a week, then increase to 50 mg daily.  You may increase the dose to 100 mg after 2 weeks on the 50 mg dose    I am also starting you on Inderal LA at bedtime.  This is a beta blocker and will  decrease your racing heart and feeling of panic   I have refilled your Vitamin D and nausea medication    KEEP UP THE GREAT WORK YOU HAVE ACCOMPLISHED ON YOUR BAD HABITS!!  Please reschedule your DEXA and Mammogram.  I recommend getting a CT scan since you are a smoker

## 2015-04-23 ENCOUNTER — Encounter: Payer: Self-pay | Admitting: Internal Medicine

## 2015-04-23 DIAGNOSIS — G621 Alcoholic polyneuropathy: Secondary | ICD-10-CM | POA: Insufficient documentation

## 2015-04-23 DIAGNOSIS — Z0000 Encounter for general adult medical examination without abnormal findings: Secondary | ICD-10-CM | POA: Insufficient documentation

## 2015-04-23 LAB — RPR

## 2015-04-23 NOTE — Assessment & Plan Note (Signed)
By history of symptoms , likely secondary to alcoholism.  Trial of gabapentin

## 2015-04-23 NOTE — Assessment & Plan Note (Signed)
Resolved,  And mg was normal.  Advised to stop potassium supplement  Lab Results  Component Value Date   NA 136 04/22/2015   K 4.4 04/22/2015   CL 95* 04/22/2015   CO2 27 04/22/2015

## 2015-04-23 NOTE — Assessment & Plan Note (Signed)
Secondary to alcohol abuse, with persistent liver enzyme elevation. Hep a and b vaccines given/completed today  Lab Results  Component Value Date   ALT 34 02/04/2015   AST 80* 02/04/2015   ALKPHOS 179* 02/04/2015   BILITOT 0.5 02/04/2015

## 2015-04-23 NOTE — Assessment & Plan Note (Signed)
Annual comprehensive preventive exam was done as well as an evaluation and management of chronic conditions .  During the course of the visit the patient was educated and counseled about appropriate screening and preventive services including :  diabetes screening, lipid analysis with projected  10 year  risk for CAD , nutrition counseling, breast, cervical and colorectal cancer screening, and recommended immunizations.  Printed recommendations for health maintenance screenings was given, and ct chest was advised given persistent tobacco abuse.

## 2015-04-23 NOTE — Assessment & Plan Note (Addendum)
Congratulated and encouraged to continue reducing daily use of alcohol.  Adding zoloft for anxiety and Inderal LA for panic attacks. .  Avoiding benzodiazepines.  Continue buspirone. Prn

## 2015-04-23 NOTE — Assessment & Plan Note (Addendum)
Secondary to tobacco abuse,  Normal iron stores given report of RLS symptoms  Lab Results  Component Value Date   WBC 11.6* 04/22/2015   HGB 16.8* 04/22/2015   HCT 49.4* 04/22/2015   MCV 108.0* 04/22/2015   PLT 224.0 04/22/2015     Lab Results  Component Value Date   IRON 176* 04/22/2015   TIBC 343 04/22/2015   FERRITIN 384.1* 04/22/2015

## 2015-04-23 NOTE — Assessment & Plan Note (Signed)
Pain has resolved,  No surgery recommended per Kathy [name].  DEXA still needed, poatient postponed due to snow

## 2015-04-23 NOTE — Addendum Note (Signed)
Addended by: Crecencio Mc on: 04/23/2015 10:19 PM   Modules accepted: Orders

## 2015-04-24 LAB — CYTOLOGY - PAP

## 2015-04-25 ENCOUNTER — Telehealth: Payer: Self-pay | Admitting: *Deleted

## 2015-04-25 NOTE — Telephone Encounter (Signed)
Patient was advised to come in for a lab appt., please advise if this lab appt is fasting.  Pt contact [phone]

## 2015-04-25 NOTE — Telephone Encounter (Signed)
Called pt unable to leave VM.

## 2015-04-25 NOTE — Telephone Encounter (Signed)
LMOMTCB

## 2015-04-26 NOTE — Telephone Encounter (Signed)
I explained to pt there is no blood work for her to get done here that Dr. Gary Fleet office should be contacting her about recent blood work performed by Dr. Derrel Nip.

## 2015-04-29 ENCOUNTER — Telehealth: Payer: Self-pay

## 2015-04-29 DIAGNOSIS — D519 Vitamin B12 deficiency anemia, unspecified: Secondary | ICD-10-CM | POA: Insufficient documentation

## 2015-04-29 NOTE — Telephone Encounter (Signed)
they can be done here. Hematology will not want to do them

## 2015-04-29 NOTE — Telephone Encounter (Signed)
Yes, patient still needs to schedule B 12 injection.

## 2015-04-29 NOTE — Telephone Encounter (Signed)
Pt is requesting a vitamin b 12 shot, pt states that she is not sure since she is being referred to Hemotology should she still be getting these shots at the office. Please advise, thanks

## 2015-04-29 NOTE — Telephone Encounter (Signed)
Told pt to call back and schedule appt

## 2015-05-07 ENCOUNTER — Ambulatory Visit (INDEPENDENT_AMBULATORY_CARE_PROVIDER_SITE_OTHER): Payer: BLUE CROSS/BLUE SHIELD | Admitting: *Deleted

## 2015-05-07 DIAGNOSIS — D519 Vitamin B12 deficiency anemia, unspecified: Secondary | ICD-10-CM

## 2015-05-07 MED ORDER — CYANOCOBALAMIN 1000 MCG/ML IJ SOLN
1000.0000 ug | Freq: Once | INTRAMUSCULAR | Status: AC
  Administered 2015-05-07: 1000 ug via INTRAMUSCULAR

## 2015-05-07 NOTE — Progress Notes (Signed)
Patient presentded for B 12 injection to right deltoid, patient c/o pain in right arm from hep A/B vaccine. Patient requested B 12 be given in left deltoid and as tto which B 12 given to left deltoid.

## 2015-05-09 ENCOUNTER — Inpatient Hospital Stay: Payer: BLUE CROSS/BLUE SHIELD | Attending: Internal Medicine | Admitting: Internal Medicine

## 2015-05-09 ENCOUNTER — Inpatient Hospital Stay: Payer: BLUE CROSS/BLUE SHIELD

## 2015-05-09 ENCOUNTER — Ambulatory Visit: Payer: BLUE CROSS/BLUE SHIELD

## 2015-05-09 VITALS — BP 116/83 | HR 111 | Temp 96.7°F | Resp 20 | Ht 62.0 in | Wt 114.4 lb

## 2015-05-09 DIAGNOSIS — Z86010 Personal history of colonic polyps: Secondary | ICD-10-CM | POA: Diagnosis not present

## 2015-05-09 DIAGNOSIS — R200 Anesthesia of skin: Secondary | ICD-10-CM

## 2015-05-09 DIAGNOSIS — D751 Secondary polycythemia: Secondary | ICD-10-CM | POA: Diagnosis present

## 2015-05-09 DIAGNOSIS — R5383 Other fatigue: Secondary | ICD-10-CM

## 2015-05-09 DIAGNOSIS — D7589 Other specified diseases of blood and blood-forming organs: Secondary | ICD-10-CM

## 2015-05-09 DIAGNOSIS — G629 Polyneuropathy, unspecified: Secondary | ICD-10-CM | POA: Diagnosis not present

## 2015-05-09 DIAGNOSIS — R202 Paresthesia of skin: Secondary | ICD-10-CM

## 2015-05-09 DIAGNOSIS — F1010 Alcohol abuse, uncomplicated: Secondary | ICD-10-CM

## 2015-05-09 DIAGNOSIS — F17210 Nicotine dependence, cigarettes, uncomplicated: Secondary | ICD-10-CM

## 2015-05-09 DIAGNOSIS — Z79899 Other long term (current) drug therapy: Secondary | ICD-10-CM | POA: Diagnosis not present

## 2015-05-09 LAB — LACTATE DEHYDROGENASE: LDH: 217 U/L — ABNORMAL HIGH (ref 98–192)

## 2015-05-09 LAB — CBC WITH DIFFERENTIAL/PLATELET
Basophils Absolute: 0.1 10*3/uL (ref 0–0.1)
Basophils Relative: 1 %
Eosinophils Absolute: 0.1 10*3/uL (ref 0–0.7)
Eosinophils Relative: 1 %
HCT: 44.5 % (ref 35.0–47.0)
Hemoglobin: 15.2 g/dL (ref 12.0–16.0)
Lymphocytes Relative: 38 %
Lymphs Abs: 4.1 10*3/uL — ABNORMAL HIGH (ref 1.0–3.6)
MCH: 36.6 pg — ABNORMAL HIGH (ref 26.0–34.0)
MCHC: 34.1 g/dL (ref 32.0–36.0)
MCV: 107.3 fL — ABNORMAL HIGH (ref 80.0–100.0)
Monocytes Absolute: 0.8 10*3/uL (ref 0.2–0.9)
Monocytes Relative: 8 %
Neutro Abs: 5.6 10*3/uL (ref 1.4–6.5)
Neutrophils Relative %: 52 %
Platelets: 211 10*3/uL (ref 150–440)
RBC: 4.15 MIL/uL (ref 3.80–5.20)
RDW: 14.1 % (ref 11.5–14.5)
WBC: 10.5 10*3/uL (ref 3.6–11.0)

## 2015-05-09 NOTE — Progress Notes (Signed)
Patient here today for initial evaluation regarding polcythemia. Patient reports having routine lab work performed at yearly physical exam.

## 2015-05-09 NOTE — Progress Notes (Signed)
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 Alcohol  # macrocytosis- likely from smoking  HISTORY OF PRESENTING ILLNESS:  Kathy [name] 55 y.o.  female has been referred to [place] for further evaluation of erythrocytosis. Patient smokes/and also drinks alcohol.  Patient states that she has 3-4 months history of tingling and numbness and burning pain in her feet especially at nighttime. Denies any headaches or palpitations. Denies any swelling in legs. Denies any skin rash or pruritus after a warm shower. Denies any unusual shortness of breath or chest pain; however does complain of fatigue.  ROS: A complete 10 point review of system is done which is negative except mentioned above in history of present illness  MEDICAL HISTORY:  Past Medical History  Diagnosis Date  . Hernia of abdominal cavity   . Alcohol abuse   . Depression   . Chicken pox   . Colon polyps   . Hip fracture ([place]) right    SURGICAL HISTORY: No past surgical history on file.  SOCIAL HISTORY: 1/2 pack/day; on alcohol abuse 3-6 drinks/day. 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 Lives with her parents. Social History   Social History  . Marital Status: Divorced    Spouse Name: N/A  . Number of Children: N/A  . Years of Education: N/A   Occupational History  . Not on file.   Social History Main Topics  . Smoking status: Current Every Day Smoker -- 0.15 packs/day    Types: Cigarettes  . Smokeless tobacco: Never Used     Comment: smokes one pack/week  . Alcohol Use: 12.6 oz/week    21 Standard drinks or equivalent per week     Comment: Patient states she is cutting back  . Drug Use: No  . Sexual Activity: Not Currently   Other Topics Concern  . Not on file   Social History Narrative    FAMILY HISTORY: Family History   Problem Relation Age of Onset  . Hypertension Mother   . Cancer Father 72    prostate    ALLERGIES:  is allergic to aspirin.  MEDICATIONS:  Current Outpatient Prescriptions  Medication Sig Dispense Refill  . busPIRone (BUSPAR) 7.5 MG tablet Take 1 tablet (7.5 mg total) by mouth 3 (three) times daily. 90 tablet 1  . gabapentin (NEURONTIN) 100 MG capsule Take 1 capsule (100 mg total) by mouth 3 (three) times daily. 90 capsule 3  . Lactobacillus Rhamnosus, GG, (CULTURELLE) CAPS Take 1 tablet by mouth daily. 90 capsule 2  . omeprazole (PRILOSEC) 40 MG capsule Take 1 capsule (40 mg total) by mouth 2 (two) times daily. 180 capsule 1  . ondansetron (ZOFRAN) 4 MG tablet TAKE ONE TABLET BY MOUTH EVERY 4 HOURS AS NEEDED FOR NAUSEA 30 tablet 5  . Vitamin D, Ergocalciferol, (DRISDOL) 50000 units CAPS capsule Take 1 capsule (50,000 Units total) by mouth every 7 (seven) days. 12 capsule 2   No current facility-administered medications for this visit.      [name]  PHYSICAL EXAMINATION:   Filed Vitals:   05/09/15 1456  BP: 116/83  Pulse: 111  Temp: 96.7 F (35.9 C)  Resp: 20   Filed Weights   05/09/15 1456  Weight: 114 lb 6.7 oz (51.9 kg)    GENERAL: Well-nourished well-developed; Alert, no distress and comfortable.  Alone.  EYES: no  pallor or icterus OROPHARYNX: no thrush or ulceration; good dentition  NECK: supple, no masses felt LYMPH:  no palpable lymphadenopathy in the cervical, axillary or inguinal regions LUNGS: clear to auscultation and  No wheeze or crackles HEART/CVS: regular rate & rhythm and no murmurs; No lower extremity edema ABDOMEN: abdomen soft, non-tender and normal bowel sounds; mild hepatomegaly; no splenomegaly.  Musculoskeletal:no cyanosis of digits and no clubbing  PSYCH: alert & oriented x 3 with fluent speech NEURO: no focal motor/sensory deficits SKIN:  no rashes or significant lesions  LABORATORY DATA:  I have reviewed the data as listed Lab Results   Component Value Date   WBC 11.6* 04/22/2015   HGB 16.8* 04/22/2015   HCT 49.4* 04/22/2015   MCV 108.0* 04/22/2015   PLT 224.0 04/22/2015    Recent Labs  07/14/14 1759 07/15/14 0435 07/16/14 0518 10/11/14 1056 02/04/15 1036 04/22/15 1222  NA 128* 134* 134* 135 136 136  K 3.1* 3.2* 4.1 3.6 3.7 4.4  CL 92* 100* 103 98 98 95*  CO2 26 27 29 27 25 27   GLUCOSE 173* 128* 135* 129* 99 95  BUN <5* <5* <5* 4* 3* 5*  CREATININE 0.61 0.48 0.48 0.46 0.57 0.44  CALCIUM 9.0 8.7* 8.2* 9.3 9.3 9.8  GFRNONAA >60 >60 >60  --   --   --   GFRAA >60 >60 >60  --   --   --   PROT  --   --  6.5 7.3 7.4  --   ALBUMIN 3.8  --  3.4* 3.7 3.9  --   AST  --   --  126* 215* 80*  --   ALT  --   --  64* 75* 34  --   ALKPHOS  --   --  97 139* 179*  --   BILITOT  --   --  0.5 0.5 0.5  --      ASSESSMENT & PLAN:   # 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 Will rule out primary polycythemia vera- check jack 2 mutation erythropoietin level LDH CBC.   # Burning pain tingling and numbness in the feet- question neuropathy from alcohol abuse versus microvascular thrombosis from polycythemia.  # macrocytosis likely from alcohol abuse.   # Patient follow-up with me in approximately 2 weeks or so to review the above results and possible need for phlebotomy.   Thank you Dr.Tullo for allowing me to participate in the care of your pleasant patient. Please do not hesitate to contact me with questions or concerns in the interim.  # 30 minutes face-to-face with the patient discussing the above plan of care; more than 50% of time spent on counseling and coordination.      Cammie Sickle, MD 05/09/2015 3:20 PM

## 2015-05-15 ENCOUNTER — Telehealth: Payer: Self-pay | Admitting: Internal Medicine

## 2015-05-15 NOTE — Telephone Encounter (Signed)
Pt scheduled for an appointment 

## 2015-05-15 NOTE — Telephone Encounter (Signed)
Pt called requesting to get a MRI. Pt states no appetite and not feeling well. Call pt @ [phone]. Thank you!

## 2015-05-16 ENCOUNTER — Telehealth: Payer: Self-pay | Admitting: Internal Medicine

## 2015-05-16 NOTE — Telephone Encounter (Signed)
Looks like there is already a referral for hematology in place, please advise?

## 2015-05-16 NOTE — Telephone Encounter (Signed)
Pt called needing a referral to DR Rogue Bussing. Pt already has the appt for 3/30 @ 2:15pm. Thank you!

## 2015-05-17 LAB — JAK2 EXON 12 MUTATION ANALYSIS

## 2015-05-17 LAB — JAK2  V617F QUAL. WITH REFLEX TO EXON 12

## 2015-05-17 LAB — ERYTHROPOIETIN: Erythropoietin: 11.5 m[IU]/mL (ref 2.6–18.5)

## 2015-05-20 ENCOUNTER — Telehealth: Payer: Self-pay | Admitting: Internal Medicine

## 2015-05-20 ENCOUNTER — Ambulatory Visit: Payer: BLUE CROSS/BLUE SHIELD | Admitting: Internal Medicine

## 2015-05-20 NOTE — Telephone Encounter (Signed)
Juliann Pulse, let Dr. Derrel Nip know please

## 2015-05-20 NOTE — Telephone Encounter (Signed)
Call get Sign and symptoms MD might want to call in meds for nausea and check for dehydration.

## 2015-05-20 NOTE — Telephone Encounter (Signed)
Ok,  Tell her to keep up her fluids,  Chicken  broth,  Ginger ale,  gatorade .  Avoid solid food until the vomiting has stopped for 12 hours

## 2015-05-20 NOTE — Telephone Encounter (Signed)
FYI,Pt called about not being able to make appt today, due to having a stomach bug. Appt is still on the schedule. Pt states she will call to resch. Thank you!

## 2015-05-20 NOTE — Telephone Encounter (Signed)
Pt states that she feels she has a stomach virus, nausea, vomiting and diarrhea for about 12 hours. Pt states that she has been taking ondansetron for nausea, she states that she does not have an appetite, pt states she is able to urinate pretty well.

## 2015-05-21 NOTE — Telephone Encounter (Signed)
Patient stated that she feels a lot better today. She did state that she had concerns about her red blood count. She requested a call in return about her blood work from hematology . Pt contact [phone]

## 2015-05-21 NOTE — Telephone Encounter (Signed)
Notified pt. 

## 2015-05-21 NOTE — Telephone Encounter (Signed)
Notified pt to call the office where the blood work was ordered.

## 2015-05-23 ENCOUNTER — Inpatient Hospital Stay (HOSPITAL_BASED_OUTPATIENT_CLINIC_OR_DEPARTMENT_OTHER): Payer: BLUE CROSS/BLUE SHIELD | Admitting: Internal Medicine

## 2015-05-23 VITALS — BP 128/87 | HR 104 | Temp 97.4°F | Resp 18 | Wt 115.5 lb

## 2015-05-23 DIAGNOSIS — D751 Secondary polycythemia: Secondary | ICD-10-CM | POA: Diagnosis not present

## 2015-05-23 DIAGNOSIS — D7589 Other specified diseases of blood and blood-forming organs: Secondary | ICD-10-CM

## 2015-05-23 DIAGNOSIS — R5383 Other fatigue: Secondary | ICD-10-CM

## 2015-05-23 DIAGNOSIS — F17210 Nicotine dependence, cigarettes, uncomplicated: Secondary | ICD-10-CM | POA: Diagnosis not present

## 2015-05-23 DIAGNOSIS — G629 Polyneuropathy, unspecified: Secondary | ICD-10-CM

## 2015-05-23 DIAGNOSIS — Z86010 Personal history of colonic polyps: Secondary | ICD-10-CM

## 2015-05-23 DIAGNOSIS — Z79899 Other long term (current) drug therapy: Secondary | ICD-10-CM

## 2015-05-23 NOTE — Patient Instructions (Signed)
Smoking Cessation, Tips for Success If you are ready to quit smoking, congratulations! You have chosen to help yourself be healthier. Cigarettes bring nicotine, tar, carbon monoxide, and other irritants into your body. Your lungs, heart, and blood vessels will be able to work better without these poisons. There are many different ways to quit smoking. Nicotine gum, nicotine patches, a nicotine inhaler, or nicotine nasal spray can help with physical craving. Hypnosis, support groups, and medicines help break the habit of smoking. WHAT THINGS CAN I DO TO MAKE QUITTING EASIER?  Here are some tips to help you quit for good:  Pick a date when you will quit smoking completely. Tell all of your friends and family about your plan to quit on that date.  Do not try to slowly cut down on the number of cigarettes you are smoking. Pick a quit date and quit smoking completely starting on that day.  Throw away all cigarettes.   Clean and remove all ashtrays from your home, work, and car.  On a card, write down your reasons for quitting. Carry the card with you and read it when you get the urge to smoke.  Cleanse your body of nicotine. Drink enough water and fluids to keep your urine clear or pale yellow. Do this after quitting to flush the nicotine from your body.  Learn to predict your moods. Do not let a bad situation be your excuse to have a cigarette. Some situations in your life might tempt you into wanting a cigarette.  Never have "just one" cigarette. It leads to wanting another and another. Remind yourself of your decision to quit.  Change habits associated with smoking. If you smoked while driving or when feeling stressed, try other activities to replace smoking. Stand up when drinking your coffee. Brush your teeth after eating. Sit in a different chair when you read the paper. Avoid alcohol while trying to quit, and try to drink fewer caffeinated beverages. Alcohol and caffeine may urge you to  smoke.  Avoid foods and drinks that can trigger a desire to smoke, such as sugary or spicy foods and alcohol.  Ask people who smoke not to smoke around you.  Have something planned to do right after eating or having a cup of coffee. For example, plan to take a walk or exercise.  Try a relaxation exercise to calm you down and decrease your stress. Remember, you may be tense and nervous for the first 2 weeks after you quit, but this will pass.  Find new activities to keep your hands busy. Play with a pen, coin, or rubber band. Doodle or draw things on paper.  Brush your teeth right after eating. This will help cut down on the craving for the taste of tobacco after meals. You can also try mouthwash.   Use oral substitutes in place of cigarettes. Try using lemon drops, carrots, cinnamon sticks, or chewing gum. Keep them handy so they are available when you have the urge to smoke.  When you have the urge to smoke, try deep breathing.  Designate your home as a nonsmoking area.  If you are a heavy smoker, ask your health care provider about a prescription for nicotine chewing gum. It can ease your withdrawal from nicotine.  Reward yourself. Set aside the cigarette money you save and buy yourself something nice.  Look for support from others. Join a support group or smoking cessation program. Ask someone at home or at work to help you with your plan   to quit smoking.  Always ask yourself, "Do I need this cigarette or is this just a reflex?" Tell yourself, "Today, I choose not to smoke," or "I do not want to smoke." You are reminding yourself of your decision to quit.  Do not replace cigarette smoking with electronic cigarettes (commonly called e-cigarettes). The safety of e-cigarettes is unknown, and some may contain harmful chemicals.  If you relapse, do not give up! Plan ahead and think about what you will do the next time you get the urge to smoke. HOW WILL I FEEL WHEN I QUIT SMOKING? You  may have symptoms of withdrawal because your body is used to nicotine (the addictive substance in cigarettes). You may crave cigarettes, be irritable, feel very hungry, cough often, get headaches, or have difficulty concentrating. The withdrawal symptoms are only temporary. They are strongest when you first quit but will go away within 10-14 days. When withdrawal symptoms occur, stay in control. Think about your reasons for quitting. Remind yourself that these are signs that your body is healing and getting used to being without cigarettes. Remember that withdrawal symptoms are easier to treat than the major diseases that smoking can cause.  Even after the withdrawal is over, expect periodic urges to smoke. However, these cravings are generally short lived and will go away whether you smoke or not. Do not smoke! WHAT RESOURCES ARE AVAILABLE TO HELP ME QUIT SMOKING? Your health care provider can direct you to community resources or hospitals for support, which may include:  Group support.  Education.  Hypnosis.  Therapy.   This information is not intended to replace advice given to you by your health care provider. Make sure you discuss any questions you have with your health care provider.   Document Released: 11/08/2003 Document Revised: 03/02/2014 Document Reviewed: 07/28/2012 Elsevier Interactive Patient Education 2016 [employer] Inc.  

## 2015-05-23 NOTE — Progress Notes (Signed)
Patient states she has a cough in the morning upon awakening and she has a lot of phlegm.  Also states she stays nauseated off and on all the time.  She has stopped all medications except for antiemetic and omeprazole.  Patient does not want her parents involved in her medical care. Patient further complains of fatigue.

## 2015-05-23 NOTE — Progress Notes (Signed)
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 No phlebotomy recom.   # Peripheral neuropathy ? Alcohol  # macrocytosis- likely from smoking  HISTORY OF PRESENTING ILLNESS:  Kathy [name] 55 y.o.  female with a history of alcohol abuse/smoking is here to review the results of her workup for erythrocytosis.  Patient continues to complain of significant fatigue. No nausea no vomiting. Appetite good. No weight loss. Continues to smoke.  ROS: A complete 10 point review of system is done which is negative except mentioned above in history of present illness  MEDICAL HISTORY:  Past Medical History  Diagnosis Date  . Hernia of abdominal cavity   . Alcohol abuse   . Depression   . Chicken pox   . Colon polyps   . Hip fracture ([place]) right    SURGICAL HISTORY: No past surgical history on file.  SOCIAL HISTORY: 1/2 pack/day; on alcohol abuse 3-6 drinks/day. 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 Lives with her parents. Social History   Social History  . Marital Status: Divorced    Spouse Name: N/A  . Number of Children: N/A  . Years of Education: N/A   Occupational History  . Not on file.   Social History Main Topics  . Smoking status: Current Every Day Smoker -- 0.15 packs/day    Types: Cigarettes  . Smokeless tobacco: Never Used     Comment: smokes one pack/week  . Alcohol Use: 12.6 oz/week    21 Standard drinks or equivalent per week     Comment: Patient states she is cutting back  . Drug Use: No  . Sexual Activity: Not Currently   Other Topics Concern  . Not on file   Social History Narrative    FAMILY HISTORY: Family History  Problem Relation Age of Onset  . Hypertension Mother   . Cancer Father 36    prostate    ALLERGIES:  is allergic to aspirin.  MEDICATIONS:  Current  Outpatient Prescriptions  Medication Sig Dispense Refill  . omeprazole (PRILOSEC) 40 MG capsule Take 1 capsule (40 mg total) by mouth 2 (two) times daily. 180 capsule 1  . ondansetron (ZOFRAN) 4 MG tablet TAKE ONE TABLET BY MOUTH EVERY 4 HOURS AS NEEDED FOR NAUSEA 30 tablet 5   No current facility-administered medications for this visit.      [name]  PHYSICAL EXAMINATION:   Filed Vitals:   05/23/15 1437  BP: 128/87  Pulse: 104  Temp: 97.4 F (36.3 C)  Resp: 18   Filed Weights   05/23/15 1437  Weight: 115 lb 8.3 oz (52.4 kg)    GENERAL: Well-nourished well-developed; Alert, no distress and comfortable.  Alone.    LABORATORY DATA:  I have reviewed the data as listed Lab Results  Component Value Date   WBC 10.5 05/09/2015   HGB 15.2 05/09/2015   HCT 44.5 05/09/2015   MCV 107.3* 05/09/2015   PLT 211 05/09/2015    Recent Labs  07/14/14 1759 07/15/14 0435 07/16/14 0518 10/11/14 1056 02/04/15 1036 04/22/15 1222  NA 128* 134* 134* 135 136 136  K 3.1* 3.2* 4.1 3.6 3.7 4.4  CL 92* 100* 103 98 98 95*  CO2 26 27 29 27 25 27   GLUCOSE 173* 128* 135* 129* 99 95  BUN <5* <5* <5* 4* 3* 5*  CREATININE 0.61 0.48 0.48  0.46 0.57 0.44  CALCIUM 9.0 8.7* 8.2* 9.3 9.3 9.8  GFRNONAA >60 >60 >60  --   --   --   GFRAA >60 >60 >60  --   --   --   PROT  --   --  6.5 7.3 7.4  --   ALBUMIN 3.8  --  3.4* 3.7 3.9  --   AST  --   --  126* 215* 80*  --   ALT  --   --  64* 75* 34  --   ALKPHOS  --   --  97 139* 179*  --   BILITOT  --   --  0.5 0.5 0.5  --      ASSESSMENT & PLAN:   # 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 JAK-2-NEG. Recommend quitting smoking. Repeat hemoglobin is 15 hematocrit is 44. No phlebotomy is recommended.  # Chronic fatigue question from alcohol versus COPD from smoking. Defer to PCP.  # Macrocytosis- from alcohol.  # Burning pain tingling and numbness in the feet- question neuropathy from alcohol abuse.   # no follow-up planned in our office. She  will continue follow with PCP.   # 15 minutes face-to-face with the patient discussing the above plan of care; more than 50% of time spent on natural history; counseling and coordination.    Cammie Sickle, MD 05/23/2015 2:48 PM

## 2015-06-10 ENCOUNTER — Encounter: Payer: Self-pay | Admitting: Internal Medicine

## 2015-06-10 ENCOUNTER — Ambulatory Visit (INDEPENDENT_AMBULATORY_CARE_PROVIDER_SITE_OTHER): Payer: BLUE CROSS/BLUE SHIELD | Admitting: Internal Medicine

## 2015-06-10 VITALS — BP 110/78 | HR 117 | Temp 99.0°F | Resp 12 | Ht 62.0 in | Wt 111.5 lb

## 2015-06-10 DIAGNOSIS — D519 Vitamin B12 deficiency anemia, unspecified: Secondary | ICD-10-CM

## 2015-06-10 DIAGNOSIS — F1024 Alcohol dependence with alcohol-induced mood disorder: Secondary | ICD-10-CM

## 2015-06-10 DIAGNOSIS — I471 Supraventricular tachycardia: Secondary | ICD-10-CM

## 2015-06-10 MED ORDER — MIRTAZAPINE 15 MG PO TABS: ORAL_TABLET | ORAL | Status: DC

## 2015-06-10 MED ORDER — ALPRAZOLAM 0.5 MG PO TABS: 0.5000 mg | ORAL_TABLET | Freq: Every evening | ORAL | Status: DC | PRN

## 2015-06-10 NOTE — Patient Instructions (Signed)
I am prescribing you your own alprazolam to use not more than once daily .  i am also prescribing remeron (mirtazipine), an antidepressant that stimulates appetite and helps with sleep issues  Take 1/2. Tablet for first week,  At bedtime  Pleaser resume the propranolol for your rapid heart rate

## 2015-06-10 NOTE — Progress Notes (Signed)
Pre-visit discussion using our clinic review tool. No additional management support is needed unless otherwise documented below in the visit note.  

## 2015-06-10 NOTE — Progress Notes (Signed)
Subjective:  Patient ID: Kathy [name], female    DOB: 11-06-1960  Age: 55 y.o. MRN: [id number]  CC: The primary encounter diagnosis was Alcohol dependence with alcohol-induced mood disorder ([place]). Diagnoses of B12 deficiency anemia and Paroxysmal supraventricular tachycardia ([place]) were also pertinent to this visit.  HPI Kathy [name] presents for follow up on alcoholism and GAD.  Patient was started on zoloft at last visit in February but stopped it due to persistent nausea.  Did not start with half a tablet.  Did not tolerate buspirone either due to nausea.  And was losing weight. Has stopped drinking vodka.  drinks some wine daily   Has been taking her mother's alprazolam instead.  The only combination that seemed to help her appetite and stay away from hard liquor,  Good appetite was either 1/2 alprazolam in the am or  A full tablet in the evening.  Stopped the inderal for unclear reasons.   She underwent an oncology eval for secondary erythrocytosis /macrocytosis , changes were evaluated and PCV ruled out.   presumed due to smoking   Outpatient Prescriptions Prior to Visit  Medication Sig Dispense Refill  . omeprazole (PRILOSEC) 40 MG capsule Take 1 capsule (40 mg total) by mouth 2 (two) times daily. 180 capsule 1  . ondansetron (ZOFRAN) 4 MG tablet TAKE ONE TABLET BY MOUTH EVERY 4 HOURS AS NEEDED FOR NAUSEA 30 tablet 5  . propranolol ER (INDERAL LA) 60 MG 24 hr capsule Take 1 capsule (60 mg total) by mouth daily. 30 capsule 2   No facility-administered medications prior to visit.    Review of Systems;  Patient denies headache, fevers, malaise, unintentional weight loss, skin rash, eye pain, sinus congestion and sinus pain, sore throat, dysphagia,  hemoptysis , cough, dyspnea, wheezing, chest pain, palpitations, orthopnea, edema, abdominal pain, nausea, melena, diarrhea, constipation, flank pain, dysuria, hematuria, urinary  Frequency, nocturia, numbness, tingling, seizures,   Focal weakness, Loss of consciousness,  Tremor, insomnia, depression, anxiety, and suicidal ideation.      Objective:  BP 110/78 mmHg  Pulse 117  Temp(Src) 99 F (37.2 C) (Oral)  Resp 12  Ht 5\' 2"  (1.575 m)  Wt 111 lb 8 oz (50.576 kg)  BMI 20.39 kg/m2  SpO2 95%  BP Readings from Last 3 Encounters:  06/10/15 110/78  05/23/15 128/87  05/09/15 116/83    Wt Readings from Last 3 Encounters:  06/10/15 111 lb 8 oz (50.576 kg)  05/23/15 115 lb 8.3 oz (52.4 kg)  05/09/15 114 lb 6.7 oz (51.9 kg)    General appearance: alert, cooperative and appears stated age Ears: normal TM's and external ear canals both ears Throat: lips, mucosa, and tongue normal; teeth and gums normal Neck: no adenopathy, no carotid bruit, supple, symmetrical, trachea midline and thyroid not enlarged, symmetric, no tenderness/mass/nodules Back: symmetric, no curvature. ROM normal. No CVA tenderness. Lungs: clear to auscultation bilaterally Heart: regular rate and rhythm, S1, S2 normal, no murmur, click, rub or gallop Abdomen: soft, non-tender; bowel sounds normal; no masses,  no organomegaly Pulses: 2+ and symmetric Skin: Skin color, texture, turgor normal. No rashes or lesions Lymph nodes: Cervical, supraclavicular, and axillary nodes normal.  Lab Results  Component Value Date   HGBA1C 5.3 04/22/2015   HGBA1C 5.5 10/11/2014    Lab Results  Component Value Date   CREATININE 0.44 04/22/2015   CREATININE 0.57 02/04/2015   CREATININE 0.46 10/11/2014    Lab Results  Component Value Date   WBC 10.5 05/09/2015  HGB 15.2 05/09/2015   HCT 44.5 05/09/2015   PLT 211 05/09/2015   GLUCOSE 95 04/22/2015   CHOL 193 10/11/2014   TRIG 282.0* 10/11/2014   HDL 45.30 10/11/2014   LDLDIRECT 119.0 10/11/2014   ALT 34 02/04/2015   AST 80* 02/04/2015   NA 136 04/22/2015   K 4.4 04/22/2015   CL 95* 04/22/2015   CREATININE 0.44 04/22/2015   BUN 5* 04/22/2015   CO2 27 04/22/2015   TSH 1.41 10/11/2014   HGBA1C  5.3 04/22/2015    No results found.  Assessment & Plan:   Problem List Items Addressed This Visit    Alcohol dependence with alcohol-induced mood disorder ([name]) - Primary    She did not tolerate zoloft. She is drinking less with a small daily dose of alprazolam.  Continue alprazolam .  Joan Mayans of remeron for appetite stimulation.       RESOLVED: B12 deficiency anemia    Continue B12 injections      Relevant Medications   cyanocobalamin ((VITAMIN B-12)) injection 1,000 mcg (Completed)   Paroxysmal supraventricular tachycardia ([place])    She was advised to resume propranolol      Relevant Medications   propranolol (INDERAL) 60 MG tablet      I have discontinued Ms. Marney's ondansetron. I am also having her start on ALPRAZolam and mirtazapine. Additionally, I am having her maintain her omeprazole, traMADol, and propranolol. We administered cyanocobalamin.  Meds ordered this encounter  Medications  . traMADol (ULTRAM) 50 MG tablet    Sig: Take 50 mg by mouth daily.     Refill:  0  . propranolol (INDERAL) 60 MG tablet    Sig: Take 60 mg by mouth daily.  [name] ALPRAZolam (XANAX) 0.5 MG tablet    Sig: Take 1 tablet (0.5 mg total) by mouth at bedtime as needed for anxiety.    Dispense:  30 tablet    Refill:  3  . mirtazapine (REMERON) 15 MG tablet    Sig: 1/2 tablet daily for one week,  Then increase to full tablet    Dispense:  30 tablet    Refill:  3  . cyanocobalamin ((VITAMIN B-12)) injection 1,000 mcg    Sig:     Medications Discontinued During This Encounter  Medication Reason  . ondansetron (ZOFRAN) 4 MG tablet     Follow-up: Return in about 3 months (around 09/09/2015).   Kathy Mc, MD

## 2015-06-11 DIAGNOSIS — I471 Supraventricular tachycardia: Secondary | ICD-10-CM | POA: Insufficient documentation

## 2015-06-11 MED ORDER — CYANOCOBALAMIN 1000 MCG/ML IJ SOLN
1000.0000 ug | Freq: Once | INTRAMUSCULAR | Status: AC
  Administered 2015-06-10: 1000 ug via INTRAMUSCULAR

## 2015-06-11 NOTE — Assessment & Plan Note (Signed)
Continue B12 injections.   

## 2015-06-11 NOTE — Assessment & Plan Note (Signed)
She was advised to resume propranolol

## 2015-06-11 NOTE — Assessment & Plan Note (Signed)
She did not tolerate zoloft. She is drinking less with a small daily dose of alprazolam.  Continue alprazolam .  Joan Mayans of remeron for appetite stimulation.

## 2015-07-05 ENCOUNTER — Telehealth: Payer: Self-pay | Admitting: Internal Medicine

## 2015-07-05 NOTE — Telephone Encounter (Signed)
Pt called stating she needs a slight increase for her ALPRAZolam (XANAX) 0.5 MG tablet. Pharmacy is [facility], [address]. Call pt @ [phone]. Thank you!

## 2015-07-05 NOTE — Telephone Encounter (Signed)
NO INCREASES IN CONTROLLED SUBSTANCES WITHOUT AN OFFICE VISIT

## 2015-07-05 NOTE — Telephone Encounter (Signed)
If she wants an increase in medications she needs a OV with Dr. Derrel Nip, thanks

## 2015-07-05 NOTE — Telephone Encounter (Signed)
Pt was called and scheduled !

## 2015-07-05 NOTE — Telephone Encounter (Signed)
Please advise change in dosing for Xanax.  Started on 4/17.  Thanks

## 2015-07-08 ENCOUNTER — Ambulatory Visit: Payer: BLUE CROSS/BLUE SHIELD | Admitting: Internal Medicine

## 2015-07-09 ENCOUNTER — Ambulatory Visit (INDEPENDENT_AMBULATORY_CARE_PROVIDER_SITE_OTHER): Payer: BLUE CROSS/BLUE SHIELD | Admitting: Surgical

## 2015-07-09 DIAGNOSIS — E538 Deficiency of other specified B group vitamins: Secondary | ICD-10-CM | POA: Diagnosis not present

## 2015-07-09 MED ORDER — CYANOCOBALAMIN 1000 MCG/ML IJ SOLN
1000.0000 ug | Freq: Once | INTRAMUSCULAR | Status: AC
  Administered 2015-07-09: 1000 ug via INTRAMUSCULAR

## 2015-07-09 NOTE — Progress Notes (Signed)
B12 injection given in left deltoid. Patient tolerated well.

## 2015-07-10 ENCOUNTER — Ambulatory Visit: Payer: BLUE CROSS/BLUE SHIELD

## 2015-07-19 ENCOUNTER — Telehealth: Payer: Self-pay | Admitting: *Deleted

## 2015-07-19 NOTE — Telephone Encounter (Signed)
Patient wanted to note that she has decided to got to the emergency room,she feels dehydrated. Her legs are giving her trouble as well.

## 2015-07-19 NOTE — Telephone Encounter (Signed)
FYI

## 2015-07-31 ENCOUNTER — Ambulatory Visit (INDEPENDENT_AMBULATORY_CARE_PROVIDER_SITE_OTHER): Payer: BLUE CROSS/BLUE SHIELD | Admitting: Internal Medicine

## 2015-07-31 ENCOUNTER — Encounter: Payer: Self-pay | Admitting: Internal Medicine

## 2015-07-31 VITALS — BP 106/70 | HR 98 | Temp 98.7°F | Resp 12 | Ht 62.0 in | Wt 112.2 lb

## 2015-07-31 DIAGNOSIS — F1024 Alcohol dependence with alcohol-induced mood disorder: Secondary | ICD-10-CM

## 2015-07-31 DIAGNOSIS — M25571 Pain in right ankle and joints of right foot: Secondary | ICD-10-CM | POA: Diagnosis not present

## 2015-07-31 DIAGNOSIS — K760 Fatty (change of) liver, not elsewhere classified: Secondary | ICD-10-CM

## 2015-07-31 DIAGNOSIS — E559 Vitamin D deficiency, unspecified: Secondary | ICD-10-CM | POA: Diagnosis not present

## 2015-07-31 DIAGNOSIS — G629 Polyneuropathy, unspecified: Secondary | ICD-10-CM

## 2015-07-31 DIAGNOSIS — F1010 Alcohol abuse, uncomplicated: Secondary | ICD-10-CM

## 2015-07-31 DIAGNOSIS — M2040 Other hammer toe(s) (acquired), unspecified foot: Secondary | ICD-10-CM | POA: Diagnosis not present

## 2015-07-31 LAB — CBC WITH DIFFERENTIAL/PLATELET
Basophils Absolute: 0 10*3/uL (ref 0.0–0.1)
Basophils Relative: 0.4 % (ref 0.0–3.0)
Eosinophils Absolute: 0.2 10*3/uL (ref 0.0–0.7)
Eosinophils Relative: 1.9 % (ref 0.0–5.0)
HCT: 42.2 % (ref 36.0–46.0)
Hemoglobin: 14.4 g/dL (ref 12.0–15.0)
Lymphocytes Relative: 40.6 % (ref 12.0–46.0)
Lymphs Abs: 3.8 10*3/uL (ref 0.7–4.0)
MCHC: 34 g/dL (ref 30.0–36.0)
MCV: 110.5 fl — ABNORMAL HIGH (ref 78.0–100.0)
Monocytes Absolute: 0.6 10*3/uL (ref 0.1–1.0)
Monocytes Relative: 6 % (ref 3.0–12.0)
Neutro Abs: 4.8 10*3/uL (ref 1.4–7.7)
Neutrophils Relative %: 51.1 % (ref 43.0–77.0)
Platelets: 314 10*3/uL (ref 150.0–400.0)
RBC: 3.82 Mil/uL — ABNORMAL LOW (ref 3.87–5.11)
RDW: 15.3 % (ref 11.5–15.5)
WBC: 9.5 10*3/uL (ref 4.0–10.5)

## 2015-07-31 LAB — COMPREHENSIVE METABOLIC PANEL
ALT: 21 U/L (ref 0–35)
AST: 69 U/L — ABNORMAL HIGH (ref 0–37)
Albumin: 3.6 g/dL (ref 3.5–5.2)
Alkaline Phosphatase: 148 U/L — ABNORMAL HIGH (ref 39–117)
BUN: 4 mg/dL — ABNORMAL LOW (ref 6–23)
CO2: 28 mEq/L (ref 19–32)
Calcium: 9 mg/dL (ref 8.4–10.5)
Chloride: 98 mEq/L (ref 96–112)
Creatinine, Ser: 0.45 mg/dL (ref 0.40–1.20)
GFR: 154.56 mL/min (ref >60.00)
Glucose, Bld: 98 mg/dL (ref 70–99)
Potassium: 4.2 mEq/L (ref 3.5–5.1)
Sodium: 137 mEq/L (ref 135–145)
Total Bilirubin: 0.5 mg/dL (ref 0.2–1.2)
Total Protein: 7 g/dL (ref 6.0–8.3)

## 2015-07-31 LAB — VITAMIN D 25 HYDROXY (VIT D DEFICIENCY, FRACTURES): VITD: 53.9 ng/mL (ref 30.00–100.00)

## 2015-07-31 MED ORDER — MIRTAZAPINE 15 MG PO TABS: 15.0000 mg | ORAL_TABLET | Freq: Every day | ORAL | Status: DC

## 2015-07-31 MED ORDER — PROPRANOLOL HCL 60 MG PO TABS
60.0000 mg | ORAL_TABLET | Freq: Every day | ORAL | Status: DC
Start: 2015-07-31 — End: 2015-09-16

## 2015-07-31 MED ORDER — ALPRAZOLAM 0.5 MG PO TABS: 0.5000 mg | ORAL_TABLET | Freq: Two times a day (BID) | ORAL | Status: DC | PRN

## 2015-07-31 NOTE — Progress Notes (Signed)
Pre-visit discussion using our clinic review tool. No additional management support is needed unless otherwise documented below in the visit note.  

## 2015-07-31 NOTE — Patient Instructions (Addendum)
You can increase the gabapentin to 200 mg daily  Try taking one after dinner and one at bedtime with one xanax   Referral to Dr Vickki Muff is in progress

## 2015-07-31 NOTE — Progress Notes (Signed)
Subjective:  Patient ID: Kathy [name], female    DOB: August 18, 1960  Age: 55 y.o. MRN: [id number]  CC: The primary encounter diagnosis was Hammertoe, unspecified laterality. Diagnoses of Pain in joint, ankle and foot, right, Vitamin D deficiency, Alcohol abuse, Alcohol dependence with alcohol-induced mood disorder ([place]), Neuropathy ([place]), and Fatty liver were also pertinent to this visit.  HPI Kathy [name] presents for follow up on alcohol abuse with neuropathy and fatty liver.   Taking gabapentin once daily .  Using alprazolam 1 mg daily at bedtime instead of 0.5 mg  because she reports that it  improves her neuropathy pain.  She has understandab asked fo ran eraly refill and ewas denied until she couuld be seen to discuss her use. She has resumed daily drinking since she ran out of alprazolam her last alcoholic drink was last night   Tolerating propranolol,  mirtazipine and vit d .    She is requesting a podiatry referral due to due to recurrent toe pain caused by her  toes crossing over each other at night.   Outpatient Prescriptions Prior to Visit  Medication Sig Dispense Refill  . omeprazole (PRILOSEC) 40 MG capsule Take 1 capsule (40 mg total) by mouth 2 (two) times daily. 180 capsule 1  . ALPRAZolam (XANAX) 0.5 MG tablet Take 1 tablet (0.5 mg total) by mouth at bedtime as needed for anxiety. 30 tablet 3  . mirtazapine (REMERON) 15 MG tablet 1/2 tablet daily for one week,  Then increase to full tablet 30 tablet 3  . propranolol (INDERAL) 60 MG tablet Take 60 mg by mouth daily.    . traMADol (ULTRAM) 50 MG tablet Take 50 mg by mouth daily. Reported on 07/31/2015  0   No facility-administered medications prior to visit.    Review of Systems;  Patient denies headache, fevers, malaise, unintentional weight loss, skin rash, eye pain, sinus congestion and sinus pain, sore throat, dysphagia,  hemoptysis , cough, dyspnea, wheezing, chest pain,, orthopnea, edema, abdominal pain,  nausea, melena, diarrhea, constipation, flank pain, dysuria, hematuria, urinary  Frequency, nocturia, numbness, tingling, seizures,  Focal weakness, Loss of consciousness,  Tremor, insomnia, depression,, and suicidal ideation.      Objective:  BP 106/70 mmHg  Pulse 98  Temp(Src) 98.7 F (37.1 C) (Oral)  Resp 12  Ht 5\' 2"  (1.575 m)  Wt 112 lb 4 oz (50.916 kg)  BMI 20.53 kg/m2  SpO2 96%  BP Readings from Last 3 Encounters:  07/31/15 106/70  06/10/15 110/78  05/23/15 128/87    Wt Readings from Last 3 Encounters:  07/31/15 112 lb 4 oz (50.916 kg)  06/10/15 111 lb 8 oz (50.576 kg)  05/23/15 115 lb 8.3 oz (52.4 kg)    General appearance: alert, cooperative and appears stated age Ears: normal TM's and external ear canals both ears Throat: lips, mucosa, and tongue normal; teeth and gums normal Neck: no adenopathy, no carotid bruit, supple, symmetrical, trachea midline and thyroid not enlarged, symmetric, no tenderness/mass/nodules Back: symmetric, no curvature. ROM normal. No CVA tenderness. Lungs: clear to auscultation bilaterally Heart: regular rate and rhythm, S1, S2 normal, no murmur, click, rub or gallop Abdomen: soft, non-tender; bowel sounds normal; no masses,  no organomegaly Pulses: 2+ and symmetric Skin: Skin color, texture, turgor normal. No rashes or lesions Lymph nodes: Cervical, supraclavicular, and axillary nodes normal. Psych: affect normal, makes good eye contact. No fidgeting,  Smiles easily.  Denies suicidal thoughts   Lab Results  Component Value Date  HGBA1C 5.3 04/22/2015   HGBA1C 5.5 10/11/2014    Lab Results  Component Value Date   CREATININE 0.45 07/31/2015   CREATININE 0.44 04/22/2015   CREATININE 0.57 02/04/2015    Lab Results  Component Value Date   WBC 9.5 07/31/2015   HGB 14.4 07/31/2015   HCT 42.2 07/31/2015   PLT 314.0 07/31/2015   GLUCOSE 98 07/31/2015   CHOL 193 10/11/2014   TRIG 282.0* 10/11/2014   HDL 45.30 10/11/2014    LDLDIRECT 119.0 10/11/2014   ALT 21 07/31/2015   AST 69* 07/31/2015   NA 137 07/31/2015   K 4.2 07/31/2015   CL 98 07/31/2015   CREATININE 0.45 07/31/2015   BUN 4* 07/31/2015   CO2 28 07/31/2015   TSH 1.41 10/11/2014   HGBA1C 5.3 04/22/2015    No results found.  Assessment & Plan:   Problem List Items Addressed This Visit    Alcohol dependence with alcohol-induced mood disorder ([place])    Discussed her use of alprazolam to manage anxiety and avoid concurrent use of alcohol.  She is aware of the risks and understands that there will be no early refills of the 1 mg dose.       Fatty liver    Secondary to alcohol abuse.  LTs remain elevated but are trending down.       Neuropathy ([place])    Secondary to alcohol abuse.  Increase gabapentin to 200 mg daily.       Hammertoe - Primary    Referral to podiatry for evaluation       Relevant Orders   Ambulatory referral to Podiatry    Other Visit Diagnoses    Pain in joint, ankle and foot, right        Relevant Orders    Ambulatory referral to Podiatry    Vitamin D deficiency        Relevant Orders    VITAMIN D 25 Hydroxy (Vit-D Deficiency, Fractures) (Completed)    Alcohol abuse        Relevant Orders    Comprehensive metabolic panel (Completed)    CBC with Differential/Platelet (Completed)       I have changed Ms. Hadley's ALPRAZolam, mirtazapine, and propranolol. I am also having her maintain her omeprazole and traMADol.  Meds ordered this encounter  Medications  . ALPRAZolam (XANAX) 0.5 MG tablet    Sig: Take 1 tablet (0.5 mg total) by mouth 2 (two) times daily as needed for anxiety.    Dispense:  60 tablet    Refill:  5  . mirtazapine (REMERON) 15 MG tablet    Sig: Take 1 tablet (15 mg total) by mouth at bedtime.    Dispense:  30 tablet    Refill:  5  . propranolol (INDERAL) 60 MG tablet    Sig: Take 1 tablet (60 mg total) by mouth daily.    Dispense:  90 tablet    Refill:  1    Medications Discontinued  During This Encounter  Medication Reason  . ALPRAZolam (XANAX) 0.5 MG tablet Reorder  . mirtazapine (REMERON) 15 MG tablet Reorder  . propranolol (INDERAL) 60 MG tablet Reorder   A total of 25 minutes of face to face time was spent with patient more than half of which was spent in counselling about the above mentioned conditions  and coordination of care  Follow-up: Return in about 6 months (around 01/30/2016).   Crecencio Mc, MD

## 2015-08-02 ENCOUNTER — Encounter: Payer: Self-pay | Admitting: *Deleted

## 2015-08-03 NOTE — Assessment & Plan Note (Signed)
Referral to podiatry for evaluation.  

## 2015-08-03 NOTE — Assessment & Plan Note (Signed)
Secondary to alcohol abuse.  Increase gabapentin to 200 mg daily.

## 2015-08-03 NOTE — Assessment & Plan Note (Signed)
Secondary to alcohol abuse.  LTs remain elevated but are trending down.

## 2015-08-03 NOTE — Assessment & Plan Note (Signed)
Discussed her use of alprazolam to manage anxiety and avoid concurrent use of alcohol.  She is aware of the risks and understands that there will be no early refills of the 1 mg dose.

## 2015-08-15 ENCOUNTER — Other Ambulatory Visit: Payer: Self-pay | Admitting: *Deleted

## 2015-08-15 NOTE — Telephone Encounter (Signed)
Pt requesting a refill on Tramadol. This is listed as a "historial" medication back in April. Last OV: 07/31/15  Per pharmacy note: pt states that she has pain in her foot & Tylenol not helping.  A referral was placed for Podiatry on 07/31/15 (no appt has been scheduled yet)

## 2015-08-16 ENCOUNTER — Other Ambulatory Visit: Payer: Self-pay | Admitting: Internal Medicine

## 2015-08-16 ENCOUNTER — Emergency Department
Admission: EM | Admit: 2015-08-16 | Discharge: 2015-08-16 | Disposition: A | Discharge status: Home / Self Care | Payer: BLUE CROSS/BLUE SHIELD | Attending: Emergency Medicine | Admitting: Emergency Medicine

## 2015-08-16 ENCOUNTER — Encounter: Payer: Self-pay | Admitting: Medical Oncology

## 2015-08-16 ENCOUNTER — Other Ambulatory Visit: Payer: Self-pay

## 2015-08-16 DIAGNOSIS — G6289 Other specified polyneuropathies: Secondary | ICD-10-CM

## 2015-08-16 DIAGNOSIS — F329 Major depressive disorder, single episode, unspecified: Secondary | ICD-10-CM | POA: Insufficient documentation

## 2015-08-16 DIAGNOSIS — F17210 Nicotine dependence, cigarettes, uncomplicated: Secondary | ICD-10-CM | POA: Diagnosis not present

## 2015-08-16 DIAGNOSIS — E860 Dehydration: Secondary | ICD-10-CM | POA: Diagnosis present

## 2015-08-16 HISTORY — DX: Polyneuropathy, unspecified: G62.9

## 2015-08-16 LAB — COMPREHENSIVE METABOLIC PANEL
ALT: 27 U/L (ref 14–54)
AST: 99 U/L — ABNORMAL HIGH (ref 15–41)
Albumin: 3.3 g/dL — ABNORMAL LOW (ref 3.5–5.0)
Alkaline Phosphatase: 171 U/L — ABNORMAL HIGH (ref 38–126)
Anion gap: 13 (ref 5–15)
BUN: <5 mg/dL — ABNORMAL LOW (ref 6–20)
CO2: 25 mmol/L (ref 22–32)
Calcium: 8.3 mg/dL — ABNORMAL LOW (ref 8.9–10.3)
Chloride: 92 mmol/L — ABNORMAL LOW (ref 101–111)
Creatinine, Ser: 0.52 mg/dL (ref 0.44–1.00)
GFR calc Af Amer: >60 mL/min (ref >60)
GFR calc non Af Amer: >60 mL/min (ref >60)
Glucose, Bld: 127 mg/dL — ABNORMAL HIGH (ref 65–99)
Potassium: 2.6 mmol/L — CL (ref 3.5–5.1)
Sodium: 130 mmol/L — ABNORMAL LOW (ref 135–145)
Total Bilirubin: 0.8 mg/dL (ref 0.3–1.2)
Total Protein: 7.3 g/dL (ref 6.5–8.1)

## 2015-08-16 LAB — URINALYSIS COMPLETE WITH MICROSCOPIC (ARMC ONLY)
Bilirubin Urine: NEGATIVE
Glucose, UA: NEGATIVE mg/dL
Ketones, ur: NEGATIVE mg/dL
Nitrite: NEGATIVE
Protein, ur: NEGATIVE mg/dL
Specific Gravity, Urine: 1.003 — ABNORMAL LOW (ref 1.005–1.030)
pH: 6 (ref 5.0–8.0)

## 2015-08-16 LAB — CBC
HCT: 43.6 % (ref 35.0–47.0)
Hemoglobin: 15.1 g/dL (ref 12.0–16.0)
MCH: 38.5 pg — ABNORMAL HIGH (ref 26.0–34.0)
MCHC: 34.7 g/dL (ref 32.0–36.0)
MCV: 110.8 fL — ABNORMAL HIGH (ref 80.0–100.0)
Platelets: 221 10*3/uL (ref 150–440)
RBC: 3.94 MIL/uL (ref 3.80–5.20)
RDW: 14.3 % (ref 11.5–14.5)
WBC: 12.7 10*3/uL — ABNORMAL HIGH (ref 3.6–11.0)

## 2015-08-16 MED ORDER — PREGABALIN 50 MG PO CAPS: 50.0000 mg | ORAL_CAPSULE | Freq: Three times a day (TID) | ORAL | Status: DC

## 2015-08-16 MED ORDER — OXYCODONE-ACETAMINOPHEN 5-325 MG PO TABS
1.0000 | ORAL_TABLET | Freq: Once | ORAL | Status: AC
  Administered 2015-08-16: 1 via ORAL
  Filled 2015-08-16: qty 1

## 2015-08-16 MED ORDER — POTASSIUM CHLORIDE CRYS ER 20 MEQ PO TBCR
40.0000 meq | EXTENDED_RELEASE_TABLET | Freq: Once | ORAL | Status: AC
  Administered 2015-08-16: 40 meq via ORAL
  Filled 2015-08-16: qty 2

## 2015-08-16 MED ORDER — POTASSIUM CHLORIDE CRYS ER 20 MEQ PO TBCR: 20.0000 meq | EXTENDED_RELEASE_TABLET | Freq: Two times a day (BID) | ORAL | Status: DC

## 2015-08-16 MED ORDER — TRAMADOL HCL 50 MG PO TABS: 50.0000 mg | ORAL_TABLET | Freq: Every day | ORAL | Status: DC

## 2015-08-16 MED ORDER — SODIUM CHLORIDE 0.9 % IV BOLUS (SEPSIS)
1000.0000 mL | Freq: Once | INTRAVENOUS | Status: AC
  Administered 2015-08-16: 1000 mL via INTRAVENOUS

## 2015-08-16 NOTE — ED Provider Notes (Signed)
[facility] Emergency Department Provider Note  Time seen: 3:17 PM  I have reviewed the triage vital signs and the nursing notes.   HISTORY  Chief Complaint Dehydration and Tachycardia    HPI Kathy [name] is a 55 y.o. female with a past medical history of alcoholism, neuropathy, who presents to the emergency department with worsening lower extremity tingling. According to the patient for the past 3-4 weeks she has had significant tingling and pain in her feet going up to her knees. She spoke to her primary care physician about this who has prescribed her gabapentin. The patient has been taking the gabapentin without relief so she came to the emergency department today due to increased pain. The patient does admit to drinking 2 alcoholic drinks this morning, which she states is due to the discomfort. Denies taking any pain medication. Denies nausea or vomiting. Denies diarrhea, chest pain or abdominal pain. Denies weakness or numbness.     Past Medical History  Diagnosis Date  . Hernia of abdominal cavity   . Alcohol abuse   . Depression   . Chicken pox   . Colon polyps   . Hip fracture ([place]) right  . Neuropathy [facility])     Patient Active Problem List   Diagnosis Date Noted  . Hammertoe 07/31/2015  . Paroxysmal supraventricular tachycardia ([place]) 06/11/2015  . Encounter for preventive health examination 04/23/2015  . Neuropathy ([place]) 04/23/2015  . Intertrochanteric fracture of left femur ([place]) 02/05/2015  . Polycythemia, secondary 10/13/2014  . Fatty liver 10/13/2014  . Alcohol dependence with alcohol-induced mood disorder ([place])   . Alcohol withdrawal ([place]) 07/13/2014  . Alcohol dependence ([place]) 07/13/2014  . Alcohol-induced mood disorder ([place]) 07/13/2014  . Acidosis 07/12/2014    History reviewed. No pertinent past surgical history.  Current Outpatient Rx  Name  Route  Sig  Dispense  Refill  . ALPRAZolam (XANAX) 0.5 MG tablet   Oral  Take 1 tablet (0.5 mg total) by mouth 2 (two) times daily as needed for anxiety.   60 tablet   5   . mirtazapine (REMERON) 15 MG tablet   Oral   Take 1 tablet (15 mg total) by mouth at bedtime.   30 tablet   5   . omeprazole (PRILOSEC) 40 MG capsule   Oral   Take 1 capsule (40 mg total) by mouth 2 (two) times daily.   180 capsule   1   . propranolol (INDERAL) 60 MG tablet   Oral   Take 1 tablet (60 mg total) by mouth daily.   90 tablet   1   . traMADol (ULTRAM) 50 MG tablet   Oral   Take 1 tablet (50 mg total) by mouth daily. If needed for toe pain .   30 tablet   0     Allergies Aspirin  Family History  Problem Relation Age of Onset  . Hypertension Mother   . Cancer Father 77    prostate    Social History Social History  Substance Use Topics  . Smoking status: Current Every Day Smoker -- 0.15 packs/day    Types: Cigarettes  . Smokeless tobacco: Never Used     Comment: smokes one pack/week  . Alcohol Use: 12.6 oz/week    21 Standard drinks or equivalent per week     Comment: Patient states she is cutting back    Review of Systems Constitutional: Negative for fever. Cardiovascular: Negative for chest pain. Respiratory: Negative for shortness of breath.  Gastrointestinal: Negative for abdominal pain Musculoskeletal: Tingling in her feet. Pain in her feet. Neurological: Negative for headache 10-point ROS otherwise negative.  ____________________________________________   PHYSICAL EXAM:  VITAL SIGNS: ED Triage Vitals  Enc Vitals Group     BP 08/16/15 1457 107/76 mmHg     Pulse Rate 08/16/15 1457 100     Resp 08/16/15 1457 18     Temp 08/16/15 1457 98.6 F (37 C)     Temp Source 08/16/15 1457 Oral     SpO2 08/16/15 1457 98 %     Weight 08/16/15 1457 112 lb (50.803 kg)     Height --      Head Cir --      Peak Flow --      Pain Score 08/16/15 1457 10     Pain Loc --      Pain Edu? --      Excl. in [place]? --     Constitutional: Alert and  oriented. Well appearing and in no distress. Eyes: Normal exam ENT   Head: Normocephalic and atraumatic.   Mouth/Throat: Mucous membranes are moist. Cardiovascular: Normal rate, regular rhythm. No murmur Respiratory: Normal respiratory effort without tachypnea nor retractions. Breath sounds are clear Gastrointestinal: Soft and nontender. No distention.  Musculoskeletal: Nontender with normal range of motion in all extremities. No lower extremity tenderness or edema. Neurologic:  Normal speech and language. No gross focal neurologic deficits Skin:  Skin is warm, dry and intact.  Psychiatric: Mood and affect are normal.   ____________________________________________    INITIAL IMPRESSION / ASSESSMENT AND PLAN / ED COURSE  Pertinent labs & imaging results that were available during my care of the patient were reviewed by me and considered in my medical decision making (see chart for details).  Normal appearing patient who presents for worsening tingling and pain in her bilateral feet. Patient's symptoms are suggestive of peripheral neuropathy. Patient states she has been diagnosed with neuropathy by her primary care doctor and started on gabapentin which has not been helping. Patient states the pain is worse so she came to the emergency department today for evaluation. Patient minutes daily alcohol use, is not interested in detox, states she has it under control and does not want help with this, states she is only here for the neuropathy. I discussed with the patient at the neuropathy is likely related to her alcohol use. Patient states she is not eaten any food in the past 2 days as she has not been hungry. States she has been drinking fluids. We will check labs, dose 1 Percocet in the emergency department and closely monitor.  Patient's labs show a slightly elevated glucose, possible early diabetes. Patient does have a low potassium of 2.6. We have dosed potassium supplements in the  emergency department. I will discharge on potassium supplement twice daily for the next 7 days. We will swab the patient from gabapentin to Lyrica. Patient will follow up with her primary care doctor this week for recheck. Patient is agreeable to plan.  ____________________________________________   FINAL CLINICAL IMPRESSION(S) / ED DIAGNOSES  Peripheral neuropathy   Harvest Dark, MD 08/16/15 [phone]

## 2015-08-16 NOTE — ED Notes (Addendum)
Pt reports that she is an acholic and for the past 2 days she has not ate any food. Pt reports last drink was this am, she had 2 vodka drinks. Pt reports that she has been feeling dehydrated and like her heart is racing. Pt reports she does not want detox. Pt also has c/o neuropathy pain to feet and legs.

## 2015-08-16 NOTE — Discharge Instructions (Signed)
As we discussed please discontinue your gabapentin and start taking Lyrica beginning tonight. Please follow-up your primary care doctor this coming week for recheck of her potassium level, and follow-up for your peripheral neuropathy. Return to the emergency department for any further concerns.   Peripheral Neuropathy Peripheral neuropathy is a type of nerve damage. It affects nerves that carry signals between the spinal cord and other parts of the body. These are called peripheral nerves. With peripheral neuropathy, one nerve or a group of nerves may be damaged.  CAUSES  Many things can damage peripheral nerves. For some people with peripheral neuropathy, the cause is unknown. Some causes include:  Diabetes. This is the most common cause of peripheral neuropathy.  Injury to a nerve.  Pressure or stress on a nerve that lasts a long time.  Too little vitamin B. Alcoholism can lead to this.  Infections.  Autoimmune diseases, such as multiple sclerosis and systemic lupus erythematosus.  Inherited nerve diseases.  Some medicines, such as cancer drugs.  Toxic substances, such as lead and mercury.  Too little blood flowing to the legs.  Kidney disease.  Thyroid disease. SIGNS AND SYMPTOMS  Different people have different symptoms. The symptoms you have will depend on which of your nerves is damaged. Common symptoms include:  Loss of feeling (numbness) in the feet and hands.  Tingling in the feet and hands.  Pain that burns.  Very sensitive skin.  Weakness.  Not being able to move a part of the body (paralysis).  Muscle twitching.  Clumsiness or poor coordination.  Loss of balance.  Not being able to control your bladder.  Feeling dizzy.  Sexual problems. DIAGNOSIS  Peripheral neuropathy is a symptom, not a disease. Finding the cause of peripheral neuropathy can be hard. To figure that out, your health care provider will take a medical history and do a physical  exam. A neurological exam will also be done. This involves checking things affected by your brain, spinal cord, and nerves (nervous system). For example, your health care provider will check your reflexes, how you move, and what you can feel.  Other types of tests may also be ordered, such as:  Blood tests.  A test of the fluid in your spinal cord.  Imaging tests, such as CT scans or an MRI.  Electromyography (EMG). This test checks the nerves that control muscles.  Nerve conduction velocity tests. These tests check how fast messages pass through your nerves.  Nerve biopsy. A small piece of nerve is removed. It is then checked under a microscope. TREATMENT   Medicine is often used to treat peripheral neuropathy. Medicines may include:  Pain-relieving medicines. Prescription or over-the-counter medicine may be suggested.  Antiseizure medicine. This may be used for pain.  Antidepressants. These also may help ease pain from neuropathy.  Lidocaine. This is a numbing medicine. You might wear a patch or be given a shot.  Mexiletine. This medicine is typically used to help control irregular heart rhythms.  Surgery. Surgery may be needed to relieve pressure on a nerve or to destroy a nerve that is causing pain.  Physical therapy to help movement.  Assistive devices to help movement. HOME CARE INSTRUCTIONS   Only take over-the-counter or prescription medicines as directed by your health care provider. Follow the instructions carefully for any given medicines. Do not take any other medicines without first getting approval from your health care provider.  If you have diabetes, work closely with your health care provider to keep  your blood sugar under control.  If you have numbness in your feet:  Check every day for signs of injury or infection. Watch for redness, warmth, and swelling.  Wear padded socks and comfortable shoes. These help protect your feet.  Do not do things that put  pressure on your damaged nerve.  Do not smoke. Smoking keeps blood from getting to damaged nerves.  Avoid or limit alcohol. Too much alcohol can cause a lack of B vitamins. These vitamins are needed for healthy nerves.  Develop a good support system. Coping with peripheral neuropathy can be stressful. Talk to a mental health specialist or join a support group if you are struggling.  Follow up with your health care provider as directed. SEEK MEDICAL CARE IF:   You have new signs or symptoms of peripheral neuropathy.  You are struggling emotionally from dealing with peripheral neuropathy.  You have a fever. SEEK IMMEDIATE MEDICAL CARE IF:   You have an injury or infection that is not healing.  You feel very dizzy or begin vomiting.  You have chest pain.  You have trouble breathing.   This information is not intended to replace advice given to you by your health care provider. Make sure you discuss any questions you have with your health care provider.   Document Released: 01/30/2002 Document Revised: 10/22/2010 Document Reviewed: 10/17/2012 Elsevier Interactive Patient Education [employer].

## 2015-08-16 NOTE — ED Notes (Signed)
Potassium 2.6. MD notified.

## 2015-08-19 NOTE — Telephone Encounter (Signed)
Dr. Derrel Nip printed a 30 day supply on 08/16/15, but it was disgarded after noticing that pt was seen in the hospital & given pain medication.

## 2015-08-22 ENCOUNTER — Encounter: Payer: Self-pay | Admitting: Internal Medicine

## 2015-08-24 ENCOUNTER — Emergency Department: Payer: BLUE CROSS/BLUE SHIELD

## 2015-08-24 ENCOUNTER — Other Ambulatory Visit: Payer: Self-pay

## 2015-08-24 ENCOUNTER — Encounter: Payer: Self-pay | Admitting: *Deleted

## 2015-08-24 ENCOUNTER — Emergency Department
Admission: EM | Admit: 2015-08-24 | Discharge: 2015-08-24 | Disposition: A | Discharge status: Home / Self Care | Payer: BLUE CROSS/BLUE SHIELD | Attending: Emergency Medicine | Admitting: Emergency Medicine

## 2015-08-24 DIAGNOSIS — F329 Major depressive disorder, single episode, unspecified: Secondary | ICD-10-CM | POA: Diagnosis not present

## 2015-08-24 DIAGNOSIS — F17210 Nicotine dependence, cigarettes, uncomplicated: Secondary | ICD-10-CM | POA: Diagnosis not present

## 2015-08-24 DIAGNOSIS — G629 Polyneuropathy, unspecified: Secondary | ICD-10-CM | POA: Diagnosis not present

## 2015-08-24 DIAGNOSIS — R079 Chest pain, unspecified: Secondary | ICD-10-CM | POA: Diagnosis present

## 2015-08-24 LAB — COMPREHENSIVE METABOLIC PANEL
ALT: 20 U/L (ref 14–54)
AST: 98 U/L — ABNORMAL HIGH (ref 15–41)
Albumin: 3.4 g/dL — ABNORMAL LOW (ref 3.5–5.0)
Alkaline Phosphatase: 186 U/L — ABNORMAL HIGH (ref 38–126)
Anion gap: 15 (ref 5–15)
BUN: <5 mg/dL — ABNORMAL LOW (ref 6–20)
CO2: 25 mmol/L (ref 22–32)
Calcium: 8.8 mg/dL — ABNORMAL LOW (ref 8.9–10.3)
Chloride: 89 mmol/L — ABNORMAL LOW (ref 101–111)
Creatinine, Ser: 0.61 mg/dL (ref 0.44–1.00)
GFR calc Af Amer: >60 mL/min (ref >60)
GFR calc non Af Amer: >60 mL/min (ref >60)
Glucose, Bld: 122 mg/dL — ABNORMAL HIGH (ref 65–99)
Potassium: 3.8 mmol/L (ref 3.5–5.1)
Sodium: 129 mmol/L — ABNORMAL LOW (ref 135–145)
Total Bilirubin: 0.8 mg/dL (ref 0.3–1.2)
Total Protein: 8.1 g/dL (ref 6.5–8.1)

## 2015-08-24 LAB — CBC
HCT: 43.7 % (ref 35.0–47.0)
Hemoglobin: 15.3 g/dL (ref 12.0–16.0)
MCH: 39.3 pg — ABNORMAL HIGH (ref 26.0–34.0)
MCHC: 35.1 g/dL (ref 32.0–36.0)
MCV: 111.9 fL — ABNORMAL HIGH (ref 80.0–100.0)
Platelets: 277 10*3/uL (ref 150–440)
RBC: 3.9 MIL/uL (ref 3.80–5.20)
RDW: 14.8 % — ABNORMAL HIGH (ref 11.5–14.5)
WBC: 10.3 10*3/uL (ref 3.6–11.0)

## 2015-08-24 LAB — TROPONIN I: Troponin I: <0.03 ng/mL (ref <0.03)

## 2015-08-24 MED ORDER — SODIUM CHLORIDE 0.9 % IV BOLUS (SEPSIS)
1000.0000 mL | Freq: Once | INTRAVENOUS | Status: AC
  Administered 2015-08-24: 1000 mL via INTRAVENOUS

## 2015-08-24 MED ORDER — KETOROLAC TROMETHAMINE 30 MG/ML IJ SOLN
30.0000 mg | Freq: Once | INTRAMUSCULAR | Status: AC
  Administered 2015-08-24: 30 mg via INTRAVENOUS
  Filled 2015-08-24: qty 1

## 2015-08-24 NOTE — ED Notes (Signed)
Pt to triage via wheelchair.  Pt reports bilateral pain in legs due to neuropathy.  Pt reports chest pain this morning.  No chest pain now.  Pt reports sob.  Pt admits to 2 etoh drinks today.

## 2015-08-24 NOTE — Discharge Instructions (Signed)
Please seek medical attention for any high fevers, chest pain, shortness of breath, change in behavior, persistent vomiting, bloody stool or any other new or concerning symptoms.   Peripheral Neuropathy Peripheral neuropathy is a type of nerve damage. It affects nerves that carry signals between the spinal cord and other parts of the body. These are called peripheral nerves. With peripheral neuropathy, one nerve or a group of nerves may be damaged.  CAUSES  Many things can damage peripheral nerves. For some people with peripheral neuropathy, the cause is unknown. Some causes include:  Diabetes. This is the most common cause of peripheral neuropathy.  Injury to a nerve.  Pressure or stress on a nerve that lasts a long time.  Too little vitamin B. Alcoholism can lead to this.  Infections.  Autoimmune diseases, such as multiple sclerosis and systemic lupus erythematosus.  Inherited nerve diseases.  Some medicines, such as cancer drugs.  Toxic substances, such as lead and mercury.  Too little blood flowing to the legs.  Kidney disease.  Thyroid disease. SIGNS AND SYMPTOMS  Different people have different symptoms. The symptoms you have will depend on which of your nerves is damaged. Common symptoms include:  Loss of feeling (numbness) in the feet and hands.  Tingling in the feet and hands.  Pain that burns.  Very sensitive skin.  Weakness.  Not being able to move a part of the body (paralysis).  Muscle twitching.  Clumsiness or poor coordination.  Loss of balance.  Not being able to control your bladder.  Feeling dizzy.  Sexual problems. DIAGNOSIS  Peripheral neuropathy is a symptom, not a disease. Finding the cause of peripheral neuropathy can be hard. To figure that out, your health care provider will take a medical history and do a physical exam. A neurological exam will also be done. This involves checking things affected by your brain, spinal cord, and  nerves (nervous system). For example, your health care provider will check your reflexes, how you move, and what you can feel.  Other types of tests may also be ordered, such as:  Blood tests.  A test of the fluid in your spinal cord.  Imaging tests, such as CT scans or an MRI.  Electromyography (EMG). This test checks the nerves that control muscles.  Nerve conduction velocity tests. These tests check how fast messages pass through your nerves.  Nerve biopsy. A small piece of nerve is removed. It is then checked under a microscope. TREATMENT   Medicine is often used to treat peripheral neuropathy. Medicines may include:  Pain-relieving medicines. Prescription or over-the-counter medicine may be suggested.  Antiseizure medicine. This may be used for pain.  Antidepressants. These also may help ease pain from neuropathy.  Lidocaine. This is a numbing medicine. You might wear a patch or be given a shot.  Mexiletine. This medicine is typically used to help control irregular heart rhythms.  Surgery. Surgery may be needed to relieve pressure on a nerve or to destroy a nerve that is causing pain.  Physical therapy to help movement.  Assistive devices to help movement. HOME CARE INSTRUCTIONS   Only take over-the-counter or prescription medicines as directed by your health care provider. Follow the instructions carefully for any given medicines. Do not take any other medicines without first getting approval from your health care provider.  If you have diabetes, work closely with your health care provider to keep your blood sugar under control.  If you have numbness in your feet:  Check every  day for signs of injury or infection. Watch for redness, warmth, and swelling.  Wear padded socks and comfortable shoes. These help protect your feet.  Do not do things that put pressure on your damaged nerve.  Do not smoke. Smoking keeps blood from getting to damaged nerves.  Avoid or  limit alcohol. Too much alcohol can cause a lack of B vitamins. These vitamins are needed for healthy nerves.  Develop a good support system. Coping with peripheral neuropathy can be stressful. Talk to a mental health specialist or join a support group if you are struggling.  Follow up with your health care provider as directed. SEEK MEDICAL CARE IF:   You have new signs or symptoms of peripheral neuropathy.  You are struggling emotionally from dealing with peripheral neuropathy.  You have a fever. SEEK IMMEDIATE MEDICAL CARE IF:   You have an injury or infection that is not healing.  You feel very dizzy or begin vomiting.  You have chest pain.  You have trouble breathing.   This information is not intended to replace advice given to you by your health care provider. Make sure you discuss any questions you have with your health care provider.   Document Released: 01/30/2002 Document Revised: 10/22/2010 Document Reviewed: 10/17/2012 Elsevier Interactive Patient Education [employer].

## 2015-08-24 NOTE — ED Provider Notes (Signed)
[facility] Emergency Department Provider Note  ____________________________________________  Time seen: ~1725  I have reviewed the triage vital signs and the nursing notes.   HISTORY  Chief Complaint Chest Pain   History limited by: Not Limited   HPI Kathy [name] is a 55 y.o. female who presents to the emergency department today with primary concerns for bilateral leg pain. She says the pain is severe. It starts in her knees and goes all the way to her toenails. She says that it feels like people are poking her with sharp needles. This pain has been present for some time. She was seen in the emergency department couple weeks ago with the same complaint and was switched from gabapentin to Lyrica. She is not sure the Lyrica is helping. She says she also went to a podiatrist in the past couple days to prescribed a cream which has only had minimal relief. In addition the patient says that she has been having some chest pain. She describes as being located in the center of the chest. It has been going on for weeks. She says she thinks it is her bones. She denies any shortness of breath although has a nonproductive cough. Denies any recent fevers.    Past Medical History  Diagnosis Date  . Hernia of abdominal cavity   . Alcohol abuse   . Depression   . Chicken pox   . Colon polyps   . Hip fracture ([place]) right  . Neuropathy [facility])     Patient Active Problem List   Diagnosis Date Noted  . Hammertoe 07/31/2015  . Paroxysmal supraventricular tachycardia ([place]) 06/11/2015  . Encounter for preventive health examination 04/23/2015  . Neuropathy ([place]) 04/23/2015  . Intertrochanteric fracture of left femur ([place]) 02/05/2015  . Polycythemia, secondary 10/13/2014  . Fatty liver 10/13/2014  . Alcohol dependence with alcohol-induced mood disorder ([place])   . Alcohol withdrawal ([place]) 07/13/2014  . Alcohol dependence ([place]) 07/13/2014  . Alcohol-induced mood disorder  ([place]) 07/13/2014  . Acidosis 07/12/2014    No past surgical history on file.  Current Outpatient Rx  Name  Route  Sig  Dispense  Refill  . ALPRAZolam (XANAX) 0.5 MG tablet   Oral   Take 1 tablet (0.5 mg total) by mouth 2 (two) times daily as needed for anxiety.   60 tablet   5   . mirtazapine (REMERON) 15 MG tablet   Oral   Take 1 tablet (15 mg total) by mouth at bedtime.   30 tablet   5   . omeprazole (PRILOSEC) 40 MG capsule   Oral   Take 1 capsule (40 mg total) by mouth 2 (two) times daily.   180 capsule   1   . potassium chloride SA (KLOR-CON M20) 20 MEQ tablet   Oral   Take 1 tablet (20 mEq total) by mouth 2 (two) times daily.   14 tablet   0   . pregabalin (LYRICA) 50 MG capsule   Oral   Take 1 capsule (50 mg total) by mouth 3 (three) times daily.   90 capsule   0   . propranolol (INDERAL) 60 MG tablet   Oral   Take 1 tablet (60 mg total) by mouth daily.   90 tablet   1   . traMADol (ULTRAM) 50 MG tablet   Oral   Take 1 tablet (50 mg total) by mouth daily. If needed for toe pain .   30 tablet   0  Allergies Aspirin  Family History  Problem Relation Age of Onset  . Hypertension Mother   . Cancer Father 57    prostate    Social History Social History  Substance Use Topics  . Smoking status: Current Every Day Smoker -- 0.15 packs/day    Types: Cigarettes  . Smokeless tobacco: Never Used     Comment: smokes one pack/week  . Alcohol Use: 12.6 oz/week    21 Standard drinks or equivalent per week     Comment: Patient states she is cutting back    Review of Systems  Constitutional: Negative for fever. Cardiovascular: Positive for chest pain. Respiratory: Negative for shortness of breath. Gastrointestinal: Negative for abdominal pain, vomiting and diarrhea. Neurological: Negative for headaches, focal weakness or numbness.  10-point ROS otherwise negative.  ____________________________________________   PHYSICAL EXAM:  VITAL  SIGNS: ED Triage Vitals  Enc Vitals Group     BP 08/24/15 1704 111/82 mmHg     Pulse Rate 08/24/15 1704 118     Resp 08/24/15 1704 20     Temp 08/24/15 1704 98.4 F (36.9 C)     Temp Source 08/24/15 1704 Oral     SpO2 08/24/15 1704 98 %     Weight 08/24/15 1704 117 lb (53.071 kg)     Height 08/24/15 1704 5\' 2"  (1.575 m)     Head Cir --      Peak Flow --      Pain Score 08/24/15 1702 10   Constitutional: Alert and oriented. Well appearing and in no distress. Eyes: Conjunctivae are normal. PERRL. Normal extraocular movements. ENT   Head: Normocephalic and atraumatic.   Nose: No congestion/rhinnorhea.   Mouth/Throat: Mucous membranes are moist.   Neck: No stridor. Hematological/Lymphatic/Immunilogical: No cervical lymphadenopathy. Cardiovascular: Tachycardic, regular rhythm.  No murmurs, rubs, or gallops. Respiratory: Normal respiratory effort without tachypnea nor retractions. Breath sounds are clear and equal bilaterally. No wheezes/rales/rhonchi. Gastrointestinal: Soft and nontender. No distention. There is no CVA tenderness. Genitourinary: Deferred Musculoskeletal: Normal range of motion in all extremities. No joint effusions.  No lower extremity tenderness nor edema. Neurologic:  Normal speech and language. No gross focal neurologic deficits are appreciated.  Skin:  Skin is warm, dry and intact. No rash noted. Psychiatric: Mood and affect are normal. Speech and behavior are normal. Patient exhibits appropriate insight and judgment.  ____________________________________________    LABS (pertinent positives/negatives)  Labs Reviewed  CBC - Abnormal; Notable for the following:    MCV 111.9 (*)    MCH 39.3 (*)    RDW 14.8 (*)    All other components within normal limits  COMPREHENSIVE METABOLIC PANEL - Abnormal; Notable for the following:    Sodium 129 (*)    Chloride 89 (*)    Glucose, Bld 122 (*)    BUN <5 (*)    Calcium 8.8 (*)    Albumin 3.4 (*)    AST  98 (*)    Alkaline Phosphatase 186 (*)    All other components within normal limits  TROPONIN I     ____________________________________________   EKG  I, Nance Pear, attending physician, personally viewed and interpreted this EKG  EKG Time: 1704 Rate: 117 Rhythm: sinus tachycardia Axis: normal Intervals: qtc 449 QRS: narrow, q waves III, aVF ST changes: no st elevation Impression: abnormal ekg   ____________________________________________    RADIOLOGY  CXR  IMPRESSION: Hiatal hernia. No acute cardiopulmonary abnormality.  ____________________________________________   PROCEDURES  Procedure(s) performed: None  Critical Care performed:  No  ____________________________________________   INITIAL IMPRESSION / ASSESSMENT AND PLAN / ED COURSE  Pertinent labs & imaging results that were available during my care of the patient were reviewed by me and considered in my medical decision making (see chart for details).  Patient presented to the emergency department today with primary complaint of bilateral leg pain and neuropathy. I did discussion with the patient in the emergency department is not the appropriate place to be managing her neuropathy. She is currently on Lyrica. She is also been given a cream. I discussed that she will not follow up with her primary care doctor and perhaps would benefit from a pain management referral at this point. In addition she is complaining of chest pain. Chest x-ray which was performed prior to my evaluation without any concerning findings. Additionally EKG without any Concerning acute findings. I think it is unlikely to be ACS given greater than 2 week history of chest pain. However think one troponin is appropriate to evaluate. Will plan on giving fluids for likely dehydration secondary to poor appetite and alcoholism. Will also give Toradol. Patient states she does take ibuprofen at home without  effect.  ----------------------------------------- 7:08 PM on 08/24/2015 -----------------------------------------  Wishes blood work did show a somewhat low sodium. I think this is likely due to alcohol use and poor nutrition. Patient was given fluids here. Heart rate did improve with fluids. I did discuss finding of hyponatremia with patient and did request that she get repeated next week.  ____________________________________________   FINAL CLINICAL IMPRESSION(S) / ED DIAGNOSES  Final diagnoses:  Neuropathy ([place])     Note: This dictation was prepared with Dragon dictation. Any transcriptional errors that result from this process are unintentional    Nance Pear, MD 08/24/15 1909

## 2015-08-24 NOTE — ED Notes (Signed)
MD in room to assess patient.  Will continue to monitor.   

## 2015-08-26 ENCOUNTER — Telehealth: Payer: Self-pay | Admitting: *Deleted

## 2015-08-26 DIAGNOSIS — G629 Polyneuropathy, unspecified: Secondary | ICD-10-CM

## 2015-08-26 MED ORDER — TRAMADOL HCL 50 MG PO TABS: 50.0000 mg | ORAL_TABLET | Freq: Two times a day (BID) | ORAL | Status: DC

## 2015-08-26 NOTE — Telephone Encounter (Signed)
Spoke with patient and notified of Refill and referral. thanks

## 2015-08-26 NOTE — Telephone Encounter (Signed)
Was seen in ED this weekend on 7/1, ED suggested follow up with PCP and possible referral, please advise, thanks

## 2015-08-26 NOTE — Telephone Encounter (Signed)
Again, unless I am given additional information I do not need 2 messages stating the same request . The second message message should refelct a call to the patient with more detailed info.  Furthermore,  Dr Melrose Nakayama is a neurologist not a pain clinic doctor,  2) What is the patient's issue?

## 2015-08-26 NOTE — Telephone Encounter (Signed)
Thank you for the additional info!  I will make the referral to Dr Melrose Nakayama for the neuropathy.  I will refill the tramadol .

## 2015-08-26 NOTE — Telephone Encounter (Signed)
Spoke with patient, she was seen in the ED twice in the past few weeks, both times for her neuropathy.  On first visit they gave her a percocet and changed her gabapentin to Lyrica.  She tried the lyrica and only took one pill and threw the bottle away because the symptoms were worse with it.  She is not currently taking anything for her neuropathy.  At last visit with you, you had prescribed Xanax and tramadol which was helping.  Can't get a refill on the xanax till the 10th and no refills on the tramadol.  In the ED this last time they suggested a referral for the neuropathy which they believe to be a pain issue.  She states that her insurance will only cover three providers at [facility] and one of them is the Dr. Melrose Nakayama as Dr. Vickki Muff recommended.  I explained that he is not a pain doctor and she said I know he is helping with the neuropathy hopefully.  She is wanting a referral to him for the neuropathy and a refill on the tramadol if possible.  Neuropathy pain is a 10-10, consistently all the time, is better when she is on her feet at work, but can't sleep at all at night.  Feels like it starts as joint knee pain and then goes to her feet, top of feet feels like a ice pick stabbing her and the bottom has pins under feet and under her toe nails.  Did try Ibuprofen at home and it is not helping.  Please advise?

## 2015-08-26 NOTE — Telephone Encounter (Signed)
Patient has requested a referral to the pain clinic at [facility], Dr. Tedd Sias.  She was advised by the Emergency Doctor to be referred to the pain clinc. Pt contact [phone]

## 2015-09-02 ENCOUNTER — Encounter: Payer: Self-pay | Admitting: *Deleted

## 2015-09-02 ENCOUNTER — Emergency Department
Admission: EM | Admit: 2015-09-02 | Discharge: 2015-09-03 | Disposition: A | Discharge status: Home / Self Care | Payer: BLUE CROSS/BLUE SHIELD | Attending: Emergency Medicine | Admitting: Emergency Medicine

## 2015-09-02 DIAGNOSIS — N390 Urinary tract infection, site not specified: Secondary | ICD-10-CM | POA: Diagnosis not present

## 2015-09-02 DIAGNOSIS — E860 Dehydration: Secondary | ICD-10-CM | POA: Diagnosis not present

## 2015-09-02 DIAGNOSIS — F329 Major depressive disorder, single episode, unspecified: Secondary | ICD-10-CM | POA: Insufficient documentation

## 2015-09-02 DIAGNOSIS — F10229 Alcohol dependence with intoxication, unspecified: Secondary | ICD-10-CM

## 2015-09-02 DIAGNOSIS — F17210 Nicotine dependence, cigarettes, uncomplicated: Secondary | ICD-10-CM | POA: Insufficient documentation

## 2015-09-02 DIAGNOSIS — R630 Anorexia: Secondary | ICD-10-CM

## 2015-09-02 DIAGNOSIS — R112 Nausea with vomiting, unspecified: Secondary | ICD-10-CM | POA: Diagnosis present

## 2015-09-02 DIAGNOSIS — Z79899 Other long term (current) drug therapy: Secondary | ICD-10-CM | POA: Insufficient documentation

## 2015-09-02 LAB — CBC
HCT: 45.7 % (ref 35.0–47.0)
Hemoglobin: 15.9 g/dL (ref 12.0–16.0)
MCH: 38.4 pg — ABNORMAL HIGH (ref 26.0–34.0)
MCHC: 34.8 g/dL (ref 32.0–36.0)
MCV: 110.3 fL — ABNORMAL HIGH (ref 80.0–100.0)
Platelets: 196 10*3/uL (ref 150–440)
RBC: 4.14 MIL/uL (ref 3.80–5.20)
RDW: 14.3 % (ref 11.5–14.5)
WBC: 11.8 10*3/uL — ABNORMAL HIGH (ref 3.6–11.0)

## 2015-09-02 LAB — URINALYSIS COMPLETE WITH MICROSCOPIC (ARMC ONLY)
Bilirubin Urine: NEGATIVE
Glucose, UA: NEGATIVE mg/dL
Ketones, ur: NEGATIVE mg/dL
Nitrite: NEGATIVE
Protein, ur: NEGATIVE mg/dL
Specific Gravity, Urine: 1.004 — ABNORMAL LOW (ref 1.005–1.030)
pH: 6 (ref 5.0–8.0)

## 2015-09-02 LAB — BASIC METABOLIC PANEL
Anion gap: 14 (ref 5–15)
BUN: 6 mg/dL (ref 6–20)
CO2: 28 mmol/L (ref 22–32)
Calcium: 9.3 mg/dL (ref 8.9–10.3)
Chloride: 87 mmol/L — ABNORMAL LOW (ref 101–111)
Creatinine, Ser: 0.54 mg/dL (ref 0.44–1.00)
GFR calc Af Amer: >60 mL/min (ref >60)
GFR calc non Af Amer: >60 mL/min (ref >60)
Glucose, Bld: 116 mg/dL — ABNORMAL HIGH (ref 65–99)
Potassium: 3.2 mmol/L — ABNORMAL LOW (ref 3.5–5.1)
Sodium: 129 mmol/L — ABNORMAL LOW (ref 135–145)

## 2015-09-02 LAB — URINE DRUG SCREEN, QUALITATIVE (ARMC ONLY)
Amphetamines, Ur Screen: NOT DETECTED
Barbiturates, Ur Screen: NOT DETECTED
Benzodiazepine, Ur Scrn: POSITIVE — AB
Cannabinoid 50 Ng, Ur ~~LOC~~: NOT DETECTED
Cocaine Metabolite,Ur ~~LOC~~: NOT DETECTED
MDMA (Ecstasy)Ur Screen: NOT DETECTED
Methadone Scn, Ur: NOT DETECTED
Opiate, Ur Screen: NOT DETECTED
Phencyclidine (PCP) Ur S: NOT DETECTED
Tricyclic, Ur Screen: NOT DETECTED

## 2015-09-02 LAB — TROPONIN I: Troponin I: <0.03 ng/mL (ref <0.03)

## 2015-09-02 MED ORDER — SODIUM CHLORIDE 0.9 % IV BOLUS (SEPSIS)
1000.0000 mL | Freq: Once | INTRAVENOUS | Status: AC
  Administered 2015-09-02: 1000 mL via INTRAVENOUS

## 2015-09-02 MED ORDER — SODIUM CHLORIDE 0.9 % IV SOLN
Freq: Once | INTRAVENOUS | Status: AC
  Administered 2015-09-02: 21:00:00 via INTRAVENOUS

## 2015-09-02 MED ORDER — CEPHALEXIN 500 MG PO CAPS
500.0000 mg | ORAL_CAPSULE | Freq: Once | ORAL | Status: AC
  Administered 2015-09-03: 500 mg via ORAL
  Filled 2015-09-02: qty 1

## 2015-09-02 NOTE — ED Provider Notes (Signed)
[facility] Emergency Department Provider Note  ____________________________________________  Time seen: Approximately 10:19 PM  I have reviewed the triage vital signs and the nursing notes.   HISTORY  Chief Complaint Alcohol Problem; Dehydration; and Emesis    HPI Kathy [name] is a 55 y.o. female with a long history of alcohol dependence, depression, and bilateral lower extremity neuropathy presenting with nausea, vomiting, anorexia. The patient reports that for the past month she has had nausea that has been treated by her primary care physician with Zofran. Over the last 5 days, her nausea and anorexia of worsened and she is intermittently vomiting. She occasionally has loose stool but no bloody stool, and she does intermittently have normal stools. She reports ongoing vodka intake. She attributes most of her symptoms to her chronic pain, which is not well controlled with tramadol, Lyrica, and Xanax, which are prescribed by her primary care physician. Today she is brought by her daughters for most concerned about her decreased eating and drinking, dehydration, and ongoing alcohol abuse. The patient is requesting detox.   Past Medical History  Diagnosis Date  . Hernia of abdominal cavity   . Alcohol abuse   . Depression   . Chicken pox   . Colon polyps   . Hip fracture ([place]) right  . Neuropathy [facility])     Patient Active Problem List   Diagnosis Date Noted  . Hammertoe 07/31/2015  . Paroxysmal supraventricular tachycardia ([place]) 06/11/2015  . Encounter for preventive health examination 04/23/2015  . Neuropathy ([place]) 04/23/2015  . Intertrochanteric fracture of left femur ([place]) 02/05/2015  . Polycythemia, secondary 10/13/2014  . Fatty liver 10/13/2014  . Alcohol dependence with alcohol-induced mood disorder ([place])   . Alcohol withdrawal ([place]) 07/13/2014  . Alcohol dependence ([place]) 07/13/2014  . Alcohol-induced mood disorder ([place]) 07/13/2014  .  Acidosis 07/12/2014    History reviewed. No pertinent past surgical history.  Current Outpatient Rx  Name  Route  Sig  Dispense  Refill  . ALPRAZolam (XANAX) 0.5 MG tablet   Oral   Take 1 tablet (0.5 mg total) by mouth 2 (two) times daily as needed for anxiety.   60 tablet   5   . cephALEXin (KEFLEX) 500 MG capsule   Oral   Take 1 capsule (500 mg total) by mouth 4 (four) times daily.   20 capsule   0   . mirtazapine (REMERON) 15 MG tablet   Oral   Take 1 tablet (15 mg total) by mouth at bedtime.   30 tablet   5   . omeprazole (PRILOSEC) 40 MG capsule   Oral   Take 1 capsule (40 mg total) by mouth 2 (two) times daily.   180 capsule   1   . potassium chloride SA (KLOR-CON M20) 20 MEQ tablet   Oral   Take 1 tablet (20 mEq total) by mouth 2 (two) times daily.   14 tablet   0   . pregabalin (LYRICA) 50 MG capsule   Oral   Take 1 capsule (50 mg total) by mouth 3 (three) times daily.   90 capsule   0   . promethazine (PHENERGAN) 12.5 MG tablet   Oral   Take 1 tablet (12.5 mg total) by mouth every 6 (six) hours as needed for nausea or vomiting.   12 tablet   0   . promethazine (PHENERGAN) 25 MG suppository   Rectal   Place 1 suppository (25 mg total) rectally every 6 (six) hours as  needed for nausea.   12 suppository   0   . propranolol (INDERAL) 60 MG tablet   Oral   Take 1 tablet (60 mg total) by mouth daily.   90 tablet   1   . traMADol (ULTRAM) 50 MG tablet   Oral   Take 1 tablet (50 mg total) by mouth 2 (two) times daily. If needed for toe pain .   60 tablet   1     Allergies Aspirin  Family History  Problem Relation Age of Onset  . Hypertension Mother   . Cancer Father 7    prostate    Social History Social History  Substance Use Topics  . Smoking status: Current Every Day Smoker -- 0.15 packs/day    Types: Cigarettes  . Smokeless tobacco: Never Used     Comment: smokes one pack/week  . Alcohol Use: 12.6 oz/week    21 Standard  drinks or equivalent per week     Comment: Patient states she is cutting back    Review of Systems Constitutional: No fever/chills.No lightheadedness or syncope. Eyes: No visual changes. ENT: No sore throat. No congestion or rhinorrhea. Cardiovascular: Denies chest pain. Denies palpitations. Respiratory: Denies shortness of breath.  No cough. Gastrointestinal: No abdominal pain.  Nausea of nausea, positive vomiting.  No diarrhea and intermittent loose stool.  No constipation. Genitourinary: Negative for dysuria. Musculoskeletal: Negative for back pain. Skin: Negative for rash. Neurological: Negative for headaches. No focal numbness, tingling or weakness.  Psychiatric:Positive alcohol dependence  10-point ROS otherwise negative.  ____________________________________________   PHYSICAL EXAM:  VITAL SIGNS: ED Triage Vitals  Enc Vitals Group     BP 09/02/15 2110 128/69 mmHg     Pulse Rate 09/02/15 2110 131     Resp 09/02/15 2110 18     Temp 09/02/15 2110 98.6 F (37 C)     Temp Source 09/02/15 2110 Oral     SpO2 09/02/15 2110 98 %     Weight 09/02/15 2110 116 lb (52.617 kg)     Height 09/02/15 2110 5\' 2"  (1.575 m)     Head Cir --      Peak Flow --      Pain Score 09/02/15 2110 0     Pain Loc --      Pain Edu? --      Excl. in [place]? --     Constitutional: Alert and oriented. Chronically ill appearing but nontoxic. Answers questions appropriately. Eyes: Conjunctivae are normal.  EOMI. No scleral icterus. Head: Atraumatic. Nose: No congestion/rhinnorhea. Mouth/Throat: Mucous membranes are dry.  Neck: No stridor.  Supple.  No JVD. No meningismus. Cardiovascular: Fast rate, regular rhythm. No murmurs, rubs or gallops.  Respiratory: Normal respiratory effort.  No accessory muscle use or retractions. Lungs CTAB.  No wheezes, rales or ronchi. Gastrointestinal: Soft, nontender and nondistended.  No guarding or rebound.  No peritoneal signs. Musculoskeletal: No LE edema.   Neurologic:  A&Ox3.  Speech is clear.  Face and smile are symmetric.  EOMI.  Moves all extremities well. Skin:  Skin is warm, dry and intact. No rash noted. Psychiatric: Mood and affect are normal.   ____________________________________________   LABS (all labs ordered are listed, but only abnormal results are displayed)  Labs Reviewed  BASIC METABOLIC PANEL - Abnormal; Notable for the following:    Sodium 129 (*)    Potassium 3.2 (*)    Chloride 87 (*)    Glucose, Bld 116 (*)    All other  components within normal limits  CBC - Abnormal; Notable for the following:    WBC 11.8 (*)    MCV 110.3 (*)    MCH 38.4 (*)    All other components within normal limits  URINALYSIS COMPLETEWITH MICROSCOPIC ([facility] ONLY) - Abnormal; Notable for the following:    Color, Urine YELLOW (*)    APPearance HAZY (*)    Specific Gravity, Urine 1.004 (*)    Hgb urine dipstick 1+ (*)    Leukocytes, UA 3+ (*)    Bacteria, UA FEW (*)    Squamous Epithelial / LPF 0-5 (*)    All other components within normal limits  URINE DRUG SCREEN, QUALITATIVE ([facility] ONLY) - Abnormal; Notable for the following:    Benzodiazepine, Ur Scrn POSITIVE (*)    All other components within normal limits  TROPONIN I  ETHANOL  BASIC METABOLIC PANEL   ____________________________________________  EKG  ED ECG REPORT I, Eula Listen, the attending physician, personally viewed and interpreted this ECG.   Date: 09/02/2015  EKG Time: 2110  Rate: 127  Rhythm: sinus tachycardia  Axis: normal  Intervals:none  ST&T Change: Nonspecific T-wave inversions in V1. No ST elevation.  ____________________________________________  RADIOLOGY  No results found.  ____________________________________________   PROCEDURES  Procedure(s) performed: None  Critical Care performed: No ____________________________________________   INITIAL IMPRESSION / ASSESSMENT AND PLAN / ED COURSE  Pertinent labs & imaging results  that were available during my care of the patient were reviewed by me and considered in my medical decision making (see chart for details).  55 y.o. female with a long history of alcohol dependence presenting with 1 month of progressively worsening nausea, now with 5 days of vomiting and anorexia. The patient has many issues around her alcohol dependence and that is the primary reason that the family has brought her here. I will place a consult for TTS for outpatient detox. The patient does have some clinical symptoms of dehydration and I believe her tachycardia is driven by that rather than by withdrawal. She is not tremulous, or hypertensive, and has recently had alcohol so DTs are very unlikely.  The patient's labs from triage due show some hyponatremia and hypokalemia, which are consistent with her decreased fluid intake. In the past, her sodium is generally around 1:30 in her potassium has been low before, said this is not far from her baseline. These numbers should improve with fluids and I will make sure that they're trending upwards.  The patient has no signs or symptoms that would be concerning for an acute intra-abdominal pathology. However given her smoking and taking history, I did let her know that I think it is important for her to have GI evaluation with endoscopy given her risk for esophageal and gastric cancer.  ----------------------------------------- 12:06 AM on 09/03/2015 -----------------------------------------  The patient is feeling better, her tachycardia has completely resolved, and she is able to tolerate liquids. We will plan to discharge the patient home. Our TTS personnel have talked to her about outpatient rehabilitation programs for her alcohol addiction. Return precautions as well as follow-up instructions were discussed. ____________________________________________  FINAL CLINICAL IMPRESSION(S) / ED DIAGNOSES  Final diagnoses:  UTI (lower urinary tract infection)   Alcohol dependence with intoxication with complication (HCC)  Anorexia  Non-intractable vomiting with nausea, vomiting of unspecified type  Dehydration      NEW MEDICATIONS STARTED DURING THIS VISIT:  New Prescriptions   CEPHALEXIN (KEFLEX) 500 MG CAPSULE    Take 1 capsule (500 mg  total) by mouth 4 (four) times daily.   PROMETHAZINE (PHENERGAN) 12.5 MG TABLET    Take 1 tablet (12.5 mg total) by mouth every 6 (six) hours as needed for nausea or vomiting.   PROMETHAZINE (PHENERGAN) 25 MG SUPPOSITORY    Place 1 suppository (25 mg total) rectally every 6 (six) hours as needed for nausea.     Eula Listen, MD 09/03/15 0006

## 2015-09-02 NOTE — ED Notes (Signed)
Pt sts that she had 4 "vodka and ginger ales" today.  Pt estimates "1 shot, maybe more" in each drink.  Pt sts that she has been able to keep down alcoholic beverages, some other liquids, sometimes her medications but not food.  Pt sts that she can't be sent home "I'm so dehydrated".  Pt alert., able to speak w/o difficulty.

## 2015-09-02 NOTE — ED Notes (Signed)
Pt to ED stating "I am very dehydrated and haven't been eating or drinking in five days." Pt chronic alcoholic, drinking one fifth per day. Pt states drinking today. Pt states emesis with anything she eats x five days. Pt tachy in 130's in triage, BP stable.

## 2015-09-03 ENCOUNTER — Telehealth: Payer: Self-pay | Admitting: Internal Medicine

## 2015-09-03 DIAGNOSIS — R531 Weakness: Secondary | ICD-10-CM

## 2015-09-03 DIAGNOSIS — E871 Hypo-osmolality and hyponatremia: Secondary | ICD-10-CM

## 2015-09-03 LAB — BASIC METABOLIC PANEL
Anion gap: 10 (ref 5–15)
BUN: 5 mg/dL — ABNORMAL LOW (ref 6–20)
CO2: 25 mmol/L (ref 22–32)
Calcium: 7.2 mg/dL — ABNORMAL LOW (ref 8.9–10.3)
Chloride: 97 mmol/L — ABNORMAL LOW (ref 101–111)
Creatinine, Ser: 0.42 mg/dL — ABNORMAL LOW (ref 0.44–1.00)
GFR calc Af Amer: >60 mL/min (ref >60)
GFR calc non Af Amer: >60 mL/min (ref >60)
Glucose, Bld: 101 mg/dL — ABNORMAL HIGH (ref 65–99)
Potassium: 3.4 mmol/L — ABNORMAL LOW (ref 3.5–5.1)
Sodium: 132 mmol/L — ABNORMAL LOW (ref 135–145)

## 2015-09-03 MED ORDER — CEPHALEXIN 500 MG PO CAPS: 500.0000 mg | ORAL_CAPSULE | Freq: Four times a day (QID) | ORAL | Status: DC

## 2015-09-03 MED ORDER — PROMETHAZINE HCL 12.5 MG PO TABS: 12.5000 mg | ORAL_TABLET | Freq: Four times a day (QID) | ORAL | Status: DC | PRN

## 2015-09-03 MED ORDER — PROMETHAZINE HCL 25 MG RE SUPP: 25.0000 mg | Freq: Four times a day (QID) | RECTAL | Status: DC | PRN

## 2015-09-03 NOTE — Telephone Encounter (Signed)
Pt's mother called. Pt needs a ED follow up appt within the next 2 days. Please call her mother at [phone].

## 2015-09-03 NOTE — BH Specialist Note (Signed)
TTS spoke with Ms. Kathy [name] and her daughter.  The family has agreed to seek inpatient substance abuse treatment form Ms. Kathy [name].  The TTS provided a list of facilities to the daughter to assist her with finding placement.

## 2015-09-03 NOTE — Discharge Instructions (Signed)
Please make an appointment with her primary care physician to discuss your recent symptoms, as well as have your blood work rechecked for your sodium levels in her potassium levels.  Please take the entire course of antibiotics for your urinary tract infection, even if you're feeling better.  Please follow up for your alcohol addiction.  Please take a clear liquid diet for the next 24 hours, then take a bland BRAT diet for the next 24 hours, then advance to a regular diet as tolerated.  Return to the emergency department if you develop severe pain, inability to keep down fluids, fever, seizure, or any other symptoms concerning to you.   Alcohol Use Disorder Alcohol use disorder is a mental disorder. It is not a one-time incident of heavy drinking. Alcohol use disorder is the excessive and uncontrollable use of alcohol over time that leads to problems with functioning in one or more areas of daily living. People with this disorder risk harming themselves and others when they drink to excess. Alcohol use disorder also can cause other mental disorders, such as mood and anxiety disorders, and serious physical problems. People with alcohol use disorder often misuse other drugs.  Alcohol use disorder is common and widespread. Some people with this disorder drink alcohol to cope with or escape from negative life events. Others drink to relieve chronic pain or symptoms of mental illness. People with a family history of alcohol use disorder are at higher risk of losing control and using alcohol to excess.  Drinking too much alcohol can cause injury, accidents, and health problems. One drink can be too much when you are:  Working.  Pregnant or breastfeeding.  Taking medicines. Ask your doctor.  Driving or planning to drive. SYMPTOMS  Signs and symptoms of alcohol use disorder may include the following:   Consumption ofalcohol inlarger amounts or over a longer period of time than  intended.  Multiple unsuccessful attempts to cutdown or control alcohol use.   A great deal of time spent obtaining alcohol, using alcohol, or recovering from the effects of alcohol (hangover).  A strong desire or urge to use alcohol (cravings).   Continued use of alcohol despite problems at work, school, or home because of alcohol use.   Continued use of alcohol despite problems in relationships because of alcohol use.  Continued use of alcohol in situations when it is physically hazardous, such as driving a car.  Continued use of alcohol despite awareness of a physical or psychological problem that is likely related to alcohol use. Physical problems related to alcohol use can involve the brain, heart, liver, stomach, and intestines. Psychological problems related to alcohol use include intoxication, depression, anxiety, psychosis, delirium, and dementia.   The need for increased amounts of alcohol to achieve the same desired effect, or a decreased effect from the consumption of the same amount of alcohol (tolerance).  Withdrawal symptoms upon reducing or stopping alcohol use, or alcohol use to reduce or avoid withdrawal symptoms. Withdrawal symptoms include:  Racing heart.  Hand tremor.  Difficulty sleeping.  Nausea.  Vomiting.  Hallucinations.  Restlessness.  Seizures. DIAGNOSIS Alcohol use disorder is diagnosed through an assessment by your health care provider. Your health care provider may start by asking three or four questions to screen for excessive or problematic alcohol use. To confirm a diagnosis of alcohol use disorder, at least two symptoms must be present within a 71-month period. The severity of alcohol use disorder depends on the number of symptoms:  Mild--two or  three.  Moderate--four or five.  Severe--six or more. Your health care provider may perform a physical exam or use results from lab tests to see if you have physical problems resulting from  alcohol use. Your health care provider may refer you to a mental health professional for evaluation. TREATMENT  Some people with alcohol use disorder are able to reduce their alcohol use to low-risk levels. Some people with alcohol use disorder need to quit drinking alcohol. When necessary, mental health professionals with specialized training in substance use treatment can help. Your health care provider can help you decide how severe your alcohol use disorder is and what type of treatment you need. The following forms of treatment are available:   Detoxification. Detoxification involves the use of prescription medicines to prevent alcohol withdrawal symptoms in the first week after quitting. This is important for people with a history of symptoms of withdrawal and for heavy drinkers who are likely to have withdrawal symptoms. Alcohol withdrawal can be dangerous and, in severe cases, cause death. Detoxification is usually provided in a hospital or in-patient substance use treatment facility.  Counseling or talk therapy. Talk therapy is provided by substance use treatment counselors. It addresses the reasons people use alcohol and ways to keep them from drinking again. The goals of talk therapy are to help people with alcohol use disorder find healthy activities and ways to cope with life stress, to identify and avoid triggers for alcohol use, and to handle cravings, which can cause relapse.  Medicines.Different medicines can help treat alcohol use disorder through the following actions:  Decrease alcohol cravings.  Decrease the positive reward response felt from alcohol use.  Produce an uncomfortable physical reaction when alcohol is used (aversion therapy).  Support groups. Support groups are run by people who have quit drinking. They provide emotional support, advice, and guidance. These forms of treatment are often combined. Some people with alcohol use disorder benefit from intensive  combination treatment provided by specialized substance use treatment centers. Both inpatient and outpatient treatment programs are available.   This information is not intended to replace advice given to you by your health care provider. Make sure you discuss any questions you have with your health care provider.   Document Released: 03/19/2004 Document Revised: 03/02/2014 Document Reviewed: 05/19/2012 Elsevier Interactive Patient Education [employer].

## 2015-09-03 NOTE — Telephone Encounter (Signed)
Can I schedule with another provider or use 11.30 Thursday.

## 2015-09-03 NOTE — Telephone Encounter (Signed)
Yes  If she can come in earlier in am for labs that would be great, if not ,, at time of visit

## 2015-09-04 NOTE — Telephone Encounter (Signed)
11.30 tomorrow and have patient comein for labs that morning early.

## 2015-09-05 ENCOUNTER — Other Ambulatory Visit (INDEPENDENT_AMBULATORY_CARE_PROVIDER_SITE_OTHER): Payer: BLUE CROSS/BLUE SHIELD

## 2015-09-05 ENCOUNTER — Ambulatory Visit (INDEPENDENT_AMBULATORY_CARE_PROVIDER_SITE_OTHER): Payer: BLUE CROSS/BLUE SHIELD | Admitting: Internal Medicine

## 2015-09-05 VITALS — BP 128/76 | HR 104 | Temp 98.7°F | Resp 12 | Ht 62.0 in | Wt 106.5 lb

## 2015-09-05 DIAGNOSIS — G629 Polyneuropathy, unspecified: Secondary | ICD-10-CM | POA: Diagnosis not present

## 2015-09-05 DIAGNOSIS — F1024 Alcohol dependence with alcohol-induced mood disorder: Secondary | ICD-10-CM

## 2015-09-05 DIAGNOSIS — E871 Hypo-osmolality and hyponatremia: Secondary | ICD-10-CM

## 2015-09-05 DIAGNOSIS — F10239 Alcohol dependence with withdrawal, unspecified: Secondary | ICD-10-CM

## 2015-09-05 DIAGNOSIS — R531 Weakness: Secondary | ICD-10-CM | POA: Diagnosis not present

## 2015-09-05 DIAGNOSIS — I471 Supraventricular tachycardia: Secondary | ICD-10-CM | POA: Diagnosis not present

## 2015-09-05 DIAGNOSIS — F10939 Alcohol use, unspecified with withdrawal, unspecified: Secondary | ICD-10-CM

## 2015-09-05 DIAGNOSIS — K2920 Alcoholic gastritis without bleeding: Secondary | ICD-10-CM | POA: Diagnosis not present

## 2015-09-05 LAB — COMPREHENSIVE METABOLIC PANEL
ALT: 22 U/L (ref 0–35)
AST: 57 U/L — ABNORMAL HIGH (ref 0–37)
Albumin: 3.3 g/dL — ABNORMAL LOW (ref 3.5–5.2)
Alkaline Phosphatase: 125 U/L — ABNORMAL HIGH (ref 39–117)
BUN: 2 mg/dL — ABNORMAL LOW (ref 6–23)
CO2: 30 mEq/L (ref 19–32)
Calcium: 9.1 mg/dL (ref 8.4–10.5)
Chloride: 95 mEq/L — ABNORMAL LOW (ref 96–112)
Creatinine, Ser: 0.47 mg/dL (ref 0.40–1.20)
GFR: 146.94 mL/min (ref >60.00)
Glucose, Bld: 137 mg/dL — ABNORMAL HIGH (ref 70–99)
Potassium: 3 mEq/L — ABNORMAL LOW (ref 3.5–5.1)
Sodium: 131 mEq/L — ABNORMAL LOW (ref 135–145)
Total Bilirubin: 0.8 mg/dL (ref 0.2–1.2)
Total Protein: 7.1 g/dL (ref 6.0–8.3)

## 2015-09-05 LAB — CBC WITH DIFFERENTIAL/PLATELET
Basophils Absolute: 0 10*3/uL (ref 0.0–0.1)
Basophils Relative: 0.3 % (ref 0.0–3.0)
Eosinophils Absolute: 0.1 10*3/uL (ref 0.0–0.7)
Eosinophils Relative: 0.7 % (ref 0.0–5.0)
HCT: 39.9 % (ref 36.0–46.0)
Hemoglobin: 13.4 g/dL (ref 12.0–15.0)
Lymphocytes Relative: 28.6 % (ref 12.0–46.0)
Lymphs Abs: 2.6 10*3/uL (ref 0.7–4.0)
MCHC: 33.7 g/dL (ref 30.0–36.0)
MCV: 110.6 fl — ABNORMAL HIGH (ref 78.0–100.0)
Monocytes Absolute: 0.7 10*3/uL (ref 0.1–1.0)
Monocytes Relative: 7.4 % (ref 3.0–12.0)
Neutro Abs: 5.8 10*3/uL (ref 1.4–7.7)
Neutrophils Relative %: 63 % (ref 43.0–77.0)
Platelets: 237 10*3/uL (ref 150.0–400.0)
RBC: 3.61 Mil/uL — ABNORMAL LOW (ref 3.87–5.11)
RDW: 14.8 % (ref 11.5–15.5)
WBC: 9.2 10*3/uL (ref 4.0–10.5)

## 2015-09-05 NOTE — Progress Notes (Signed)
Pre-visit discussion using our clinic review tool. No additional management support is needed unless otherwise documented below in the visit note.  

## 2015-09-05 NOTE — Patient Instructions (Addendum)
Stay on omeprazole, take 40 mg daily  for gastritis. It also treats ulcers  Stop the ibuprofen , it may be aggravating your stomach issues  Take the tramadol tonight and the alprazolam, to prevent withdrawal symptoms from occurring tonight    B12 injection today   drink gatorade, eat mashed potatoes,  no more smoothies no mild products for hte next 24 hours ,

## 2015-09-05 NOTE — Progress Notes (Signed)
Subjective:  Patient ID: Kathy [name], female    DOB: September 08, 1960  Age: 55 y.o. MRN: [id number]  CC: The primary encounter diagnosis was Alcohol dependence with alcohol-induced mood disorder ([place]). 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  HPI: [name] presents for ER follow up from July 11 for persistent nausea and vomiting with hyponatremia, hypokalemia and mild hepatic enzyme elveation secondary to ongoing alcohol abuse.  labs were repeated yesterday and reviewed with patient today  Inpatient rehabilitation for substance abuse  Was requested by patient.  A list of local facilities offering inpatient programs was given to patient and daughter by [facility] personnel during ER visit .  Patient is accompanied by her daughter today and has chosen [facility] [place]. [name] She wants to delay for a day due to ongoing symptoms of nausea and leg pain.    She is no longer vomiting.  Her nausea has improved today but she has not eaten solid food yet due to abdominal pain and mild nausea.    She has been drinking  green smoothies made with kale.  Appropriate diet reviewed.  2)  She is concerned that they will not treat her  neuropathy while she is there because she has been treating it with tramadol.  She never started the Lyrica because of side effects mentioned by a friend.  3( she has been using alprazolam and tramadol with good resutls,  Her last dose of alprazolam was 2 days ago.    She is concerned about her persistent Tachycardia and how they will treat it.    Outpatient Prescriptions Prior to Visit  Medication Sig Dispense Refill  . ALPRAZolam (XANAX) 0.5 MG tablet Take 1 tablet (0.5 mg total) by mouth 2 (two) times daily as needed for anxiety. 60 tablet 5  . cephALEXin (KEFLEX) 500 MG capsule  Take 1 capsule (500 mg total) by mouth 4 (four) times daily. 20 capsule 0  . promethazine (PHENERGAN) 12.5 MG tablet Take 1 tablet (12.5 mg total) by mouth every 6 (six) hours as needed for nausea or vomiting. 12 tablet 0  . promethazine (PHENERGAN) 25 MG suppository Place 1 suppository (25 mg total) rectally every 6 (six) hours as needed for nausea. 12 suppository 0  . traMADol (ULTRAM) 50 MG tablet Take 1 tablet (50 mg total) by mouth 2 (two) times daily. If needed for toe pain . 60 tablet 1  . mirtazapine (REMERON) 15 MG tablet Take 1 tablet (15 mg total) by mouth at bedtime. (Patient not taking: Reported on 09/05/2015) 30 tablet 5  . omeprazole (PRILOSEC) 40 MG capsule Take 1 capsule (40 mg total) by mouth 2 (two) times daily. (Patient not taking: Reported on 09/05/2015) 180 capsule 1  . potassium chloride SA (KLOR-CON M20) 20 MEQ tablet Take 1 tablet (20 mEq total) by mouth 2 (two) times daily. (Patient not taking: Reported on 09/05/2015) 14 tablet 0  . pregabalin (LYRICA) 50 MG capsule Take 1 capsule (50 mg total) by mouth 3 (three) times daily. (Patient not taking: Reported on 09/05/2015) 90 capsule 0  . propranolol (INDERAL) 60 MG tablet Take 1 tablet (60 mg total) by mouth daily. (Patient not taking: Reported on 09/05/2015) 90 tablet 1   No facility-administered medications prior to visit.    Review of Systems;  Patient denies headache, fevers, malaise, unintentional weight loss, skin rash, eye pain,  sinus congestion and sinus pain, sore throat, dysphagia,  hemoptysis , cough, dyspnea, wheezing, chest pain, palpitations, orthopnea, edema, abdominal pain,], melena,], constipation, flank pain, dysuria, hematuria, urinary  Frequency, nocturia, numbness, tingling, seizures,  Focal weakness, Loss of consciousness,   depression,  and suicidal ideation.      Objective:  BP 128/76 mmHg  Pulse 104  Temp(Src) 98.7 F (37.1 C) (Oral)  Resp 12  Ht 5\' 2"  (1.575 m)  Wt 106 lb 8 oz (48.308 kg)  BMI  19.47 kg/m2  SpO2 98%  BP Readings from Last 3 Encounters:  09/05/15 128/76  09/03/15 127/76  08/24/15 118/83    Wt Readings from Last 3 Encounters:  09/05/15 106 lb 8 oz (48.308 kg)  09/02/15 116 lb (52.617 kg)  08/24/15 117 lb (53.071 kg)    General appearance: alert, cooperative and appears stated age Ears: normal TM's and external ear canals both ears Throat: lips, mucosa, and tongue normal; teeth and gums normal Neck: no adenopathy, no carotid bruit, supple, symmetrical, trachea midline and thyroid not enlarged, symmetric, no tenderness/mass/nodules Back: symmetric, no curvature. ROM normal. No CVA tenderness. Lungs: clear to auscultation bilaterally Heart: regular rate and rhythm, S1, S2 normal, no murmur, click, rub or gallop Abdomen: soft, non-tender; bowel sounds normal; no masses,  no organomegaly Pulses: 2+ and symmetric Skin: Skin color, texture, turgor normal. No rashes or lesions Lymph nodes: Cervical, supraclavicular, and axillary nodes normal.  Lab Results  Component Value Date   HGBA1C 5.3 04/22/2015   HGBA1C 5.5 10/11/2014    Lab Results  Component Value Date   CREATININE 0.47 09/05/2015   CREATININE 0.42* 09/02/2015   CREATININE 0.54 09/02/2015    Lab Results  Component Value Date   WBC 9.2 09/05/2015   HGB 13.4 09/05/2015   HCT 39.9 09/05/2015   PLT 237.0 09/05/2015   GLUCOSE 137* 09/05/2015   CHOL 193 10/11/2014   TRIG 282.0* 10/11/2014   HDL 45.30 10/11/2014   LDLDIRECT 119.0 10/11/2014   ALT 22 09/05/2015   AST 57* 09/05/2015   NA 131* 09/05/2015   K 3.0* 09/05/2015   CL 95* 09/05/2015   CREATININE 0.47 09/05/2015   BUN 2* 09/05/2015   CO2 30 09/05/2015   TSH 1.41 10/11/2014   HGBA1C 5.3 04/22/2015    No results found.  Assessment & Plan:   Problem List Items Addressed This Visit    Alcohol withdrawal ([place])    She is at risk for withdrawal symptoms due to alcohol abuse.  Advised to take on alprazolam tonight and to advise  facility of her last dose when she enters tomorrow       Neuropathy ([place])    secondary to alcohol abuse.  Reassured patient that the facility will treat her pain       Paroxysmal supraventricular tachycardia ([place])    Secondary to dehydration.  Baseline pulse appears to be 98 to 103 .       Alcohol dependence with alcohol-induced mood disorder ([place]) - Primary    With recent ER visit for intractible nausea vomiting and hyponatremia.  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 alcohol abstinence a priority,,  avoid NSAIDs         A total of 25 minutes of face to face time was spent with patient more than half of which was spent in counselling about the above  mentioned conditions  and coordination of care  I am having Ms. Jolliffe maintain her omeprazole, ALPRAZolam, mirtazapine, propranolol, pregabalin, potassium chloride SA, traMADol, promethazine, promethazine, and cephALEXin.  No orders of the defined types were placed in this encounter.    There are no discontinued medications.  Follow-up: No Follow-up on file.   Crecencio Mc, MD

## 2015-09-06 DIAGNOSIS — K2920 Alcoholic gastritis without bleeding: Secondary | ICD-10-CM | POA: Insufficient documentation

## 2015-09-06 NOTE — Assessment & Plan Note (Signed)
With recent ER visit for intractible nausea vomiting and hyponatremia.  She is entering a 30 day inpatient rehab progran at [facility]

## 2015-09-06 NOTE — Assessment & Plan Note (Signed)
Continue prilosec and phenergan. alcohol abstinence a priority,,  avoid NSAIDs

## 2015-09-07 NOTE — Assessment & Plan Note (Signed)
She is at risk for withdrawal symptoms due to alcohol abuse.  Advised to take on alprazolam tonight and to advise facility of her last dose when she enters tomorrow

## 2015-09-07 NOTE — Assessment & Plan Note (Signed)
secondary to alcohol abuse.  Reassured patient that the facility will treat her pain

## 2015-09-07 NOTE — Assessment & Plan Note (Signed)
Secondary to dehydration.  Baseline pulse appears to be 98 to 103 .

## 2015-09-10 ENCOUNTER — Emergency Department (HOSPITAL_COMMUNITY): Payer: BLUE CROSS/BLUE SHIELD

## 2015-09-10 ENCOUNTER — Encounter (HOSPITAL_COMMUNITY): Payer: Self-pay

## 2015-09-10 ENCOUNTER — Encounter: Payer: Self-pay | Admitting: *Deleted

## 2015-09-10 ENCOUNTER — Observation Stay (HOSPITAL_COMMUNITY): Payer: BLUE CROSS/BLUE SHIELD

## 2015-09-10 ENCOUNTER — Inpatient Hospital Stay (HOSPITAL_COMMUNITY)
Admission: EM | Admit: 2015-09-10 | Discharge: 2015-09-16 | DRG: 871 | Disposition: A | Discharge status: Skilled Nursing Facility | Payer: BLUE CROSS/BLUE SHIELD | Attending: Internal Medicine | Admitting: Internal Medicine

## 2015-09-10 DIAGNOSIS — J189 Pneumonia, unspecified organism: Secondary | ICD-10-CM | POA: Diagnosis not present

## 2015-09-10 DIAGNOSIS — G621 Alcoholic polyneuropathy: Secondary | ICD-10-CM | POA: Diagnosis present

## 2015-09-10 DIAGNOSIS — K219 Gastro-esophageal reflux disease without esophagitis: Secondary | ICD-10-CM | POA: Diagnosis present

## 2015-09-10 DIAGNOSIS — K760 Fatty (change of) liver, not elsewhere classified: Secondary | ICD-10-CM | POA: Diagnosis present

## 2015-09-10 DIAGNOSIS — S8262XA Displaced fracture of lateral malleolus of left fibula, initial encounter for closed fracture: Secondary | ICD-10-CM | POA: Diagnosis present

## 2015-09-10 DIAGNOSIS — A419 Sepsis, unspecified organism: Secondary | ICD-10-CM | POA: Diagnosis present

## 2015-09-10 DIAGNOSIS — Z79899 Other long term (current) drug therapy: Secondary | ICD-10-CM | POA: Diagnosis not present

## 2015-09-10 DIAGNOSIS — G9340 Encephalopathy, unspecified: Secondary | ICD-10-CM | POA: Diagnosis present

## 2015-09-10 DIAGNOSIS — F329 Major depressive disorder, single episode, unspecified: Secondary | ICD-10-CM | POA: Diagnosis present

## 2015-09-10 DIAGNOSIS — W19XXXA Unspecified fall, initial encounter: Secondary | ICD-10-CM | POA: Diagnosis present

## 2015-09-10 DIAGNOSIS — R05 Cough: Secondary | ICD-10-CM

## 2015-09-10 DIAGNOSIS — E860 Dehydration: Secondary | ICD-10-CM | POA: Diagnosis present

## 2015-09-10 DIAGNOSIS — K449 Diaphragmatic hernia without obstruction or gangrene: Secondary | ICD-10-CM | POA: Diagnosis present

## 2015-09-10 DIAGNOSIS — F10239 Alcohol dependence with withdrawal, unspecified: Secondary | ICD-10-CM | POA: Diagnosis present

## 2015-09-10 DIAGNOSIS — M25572 Pain in left ankle and joints of left foot: Secondary | ICD-10-CM | POA: Diagnosis present

## 2015-09-10 DIAGNOSIS — J690 Pneumonitis due to inhalation of food and vomit: Secondary | ICD-10-CM | POA: Insufficient documentation

## 2015-09-10 DIAGNOSIS — E875 Hyperkalemia: Secondary | ICD-10-CM | POA: Diagnosis present

## 2015-09-10 DIAGNOSIS — F1010 Alcohol abuse, uncomplicated: Secondary | ICD-10-CM

## 2015-09-10 DIAGNOSIS — Y9289 Other specified places as the place of occurrence of the external cause: Secondary | ICD-10-CM

## 2015-09-10 DIAGNOSIS — R509 Fever, unspecified: Secondary | ICD-10-CM

## 2015-09-10 DIAGNOSIS — F05 Delirium due to known physiological condition: Secondary | ICD-10-CM | POA: Diagnosis present

## 2015-09-10 DIAGNOSIS — E876 Hypokalemia: Secondary | ICD-10-CM | POA: Diagnosis present

## 2015-09-10 DIAGNOSIS — F17210 Nicotine dependence, cigarettes, uncomplicated: Secondary | ICD-10-CM | POA: Diagnosis present

## 2015-09-10 DIAGNOSIS — R059 Cough, unspecified: Secondary | ICD-10-CM

## 2015-09-10 DIAGNOSIS — S82892A Other fracture of left lower leg, initial encounter for closed fracture: Secondary | ICD-10-CM | POA: Diagnosis not present

## 2015-09-10 DIAGNOSIS — F1024 Alcohol dependence with alcohol-induced mood disorder: Secondary | ICD-10-CM | POA: Diagnosis present

## 2015-09-10 DIAGNOSIS — E871 Hypo-osmolality and hyponatremia: Secondary | ICD-10-CM | POA: Diagnosis present

## 2015-09-10 DIAGNOSIS — R6510 Systemic inflammatory response syndrome (SIRS) of non-infectious origin without acute organ dysfunction: Secondary | ICD-10-CM | POA: Diagnosis not present

## 2015-09-10 DIAGNOSIS — Z8781 Personal history of (healed) traumatic fracture: Secondary | ICD-10-CM

## 2015-09-10 LAB — CBC WITH DIFFERENTIAL/PLATELET
Basophils Absolute: 0 10*3/uL (ref 0.0–0.1)
Basophils Relative: 0 %
Eosinophils Absolute: 0 10*3/uL (ref 0.0–0.7)
Eosinophils Relative: 0 %
HCT: 34 % — ABNORMAL LOW (ref 36.0–46.0)
Hemoglobin: 12.2 g/dL (ref 12.0–15.0)
Lymphocytes Relative: 6 %
Lymphs Abs: 1.3 10*3/uL (ref 0.7–4.0)
MCH: 36.9 pg — ABNORMAL HIGH (ref 26.0–34.0)
MCHC: 35.9 g/dL (ref 30.0–36.0)
MCV: 102.7 fL — ABNORMAL HIGH (ref 78.0–100.0)
Monocytes Absolute: 1.5 10*3/uL — ABNORMAL HIGH (ref 0.1–1.0)
Monocytes Relative: 8 %
Neutro Abs: 17.1 10*3/uL — ABNORMAL HIGH (ref 1.7–7.7)
Neutrophils Relative %: 86 %
Platelets: 290 10*3/uL (ref 150–400)
RBC: 3.31 MIL/uL — ABNORMAL LOW (ref 3.87–5.11)
RDW: 13 % (ref 11.5–15.5)
WBC: 19.8 10*3/uL — ABNORMAL HIGH (ref 4.0–10.5)

## 2015-09-10 LAB — BASIC METABOLIC PANEL
Anion gap: 9 (ref 5–15)
BUN: <5 mg/dL — ABNORMAL LOW (ref 6–20)
CO2: 26 mmol/L (ref 22–32)
Calcium: 8.6 mg/dL — ABNORMAL LOW (ref 8.9–10.3)
Chloride: 101 mmol/L (ref 101–111)
Creatinine, Ser: 0.58 mg/dL (ref 0.44–1.00)
GFR calc Af Amer: >60 mL/min (ref >60)
GFR calc non Af Amer: >60 mL/min (ref >60)
Glucose, Bld: 132 mg/dL — ABNORMAL HIGH (ref 65–99)
Potassium: 2.5 mmol/L — CL (ref 3.5–5.1)
Sodium: 136 mmol/L (ref 135–145)

## 2015-09-10 LAB — URINALYSIS, ROUTINE W REFLEX MICROSCOPIC
Bilirubin Urine: NEGATIVE
Glucose, UA: NEGATIVE mg/dL
Hgb urine dipstick: NEGATIVE
Ketones, ur: NEGATIVE mg/dL
Nitrite: POSITIVE — AB
Protein, ur: NEGATIVE mg/dL
Specific Gravity, Urine: 1.005 (ref 1.005–1.030)
pH: 6 (ref 5.0–8.0)

## 2015-09-10 LAB — ETHANOL: Alcohol, Ethyl (B): <5 mg/dL (ref <5)

## 2015-09-10 LAB — RAPID URINE DRUG SCREEN, HOSP PERFORMED
Amphetamines: NOT DETECTED
Barbiturates: NOT DETECTED
Benzodiazepines: POSITIVE — AB
Cocaine: NOT DETECTED
Opiates: NOT DETECTED
Tetrahydrocannabinol: NOT DETECTED

## 2015-09-10 LAB — COMPREHENSIVE METABOLIC PANEL
ALT: 21 U/L (ref 14–54)
AST: 49 U/L — ABNORMAL HIGH (ref 15–41)
Albumin: 3 g/dL — ABNORMAL LOW (ref 3.5–5.0)
Alkaline Phosphatase: 105 U/L (ref 38–126)
Anion gap: 12 (ref 5–15)
BUN: 6 mg/dL (ref 6–20)
CO2: 26 mmol/L (ref 22–32)
Calcium: 8.6 mg/dL — ABNORMAL LOW (ref 8.9–10.3)
Chloride: 89 mmol/L — ABNORMAL LOW (ref 101–111)
Creatinine, Ser: 0.82 mg/dL (ref 0.44–1.00)
GFR calc Af Amer: >60 mL/min (ref >60)
GFR calc non Af Amer: >60 mL/min (ref >60)
Glucose, Bld: 117 mg/dL — ABNORMAL HIGH (ref 65–99)
Potassium: 2 mmol/L — CL (ref 3.5–5.1)
Sodium: 127 mmol/L — ABNORMAL LOW (ref 135–145)
Total Bilirubin: 0.8 mg/dL (ref 0.3–1.2)
Total Protein: 6.8 g/dL (ref 6.5–8.1)

## 2015-09-10 LAB — URINE MICROSCOPIC-ADD ON

## 2015-09-10 LAB — MAGNESIUM
Magnesium: 0.8 mg/dL — CL (ref 1.7–2.4)
Magnesium: 3.4 mg/dL — ABNORMAL HIGH (ref 1.7–2.4)

## 2015-09-10 LAB — MRSA PCR SCREENING: MRSA by PCR: NEGATIVE

## 2015-09-10 MED ORDER — LORATADINE 10 MG PO TABS
10.0000 mg | ORAL_TABLET | Freq: Every day | ORAL | Status: DC
  Administered 2015-09-11: 10 mg via ORAL
  Filled 2015-09-10: qty 1

## 2015-09-10 MED ORDER — SODIUM CHLORIDE 0.9% FLUSH
3.0000 mL | Freq: Two times a day (BID) | INTRAVENOUS | Status: DC
  Administered 2015-09-11 – 2015-09-13 (×4): 3 mL via INTRAVENOUS

## 2015-09-10 MED ORDER — SODIUM CHLORIDE 0.9 % IV SOLN: INTRAVENOUS | Status: DC

## 2015-09-10 MED ORDER — GUAIFENESIN ER 600 MG PO TB12
600.0000 mg | ORAL_TABLET | Freq: Two times a day (BID) | ORAL | Status: DC
  Administered 2015-09-11 – 2015-09-16 (×11): 600 mg via ORAL
  Filled 2015-09-10 (×12): qty 1

## 2015-09-10 MED ORDER — VITAMIN B-1 100 MG PO TABS: 100.0000 mg | ORAL_TABLET | Freq: Every day | ORAL | Status: DC

## 2015-09-10 MED ORDER — GABAPENTIN 400 MG PO CAPS
400.0000 mg | ORAL_CAPSULE | Freq: Three times a day (TID) | ORAL | Status: DC
  Administered 2015-09-10 – 2015-09-16 (×18): 400 mg via ORAL
  Filled 2015-09-10 (×18): qty 1

## 2015-09-10 MED ORDER — MAGNESIUM SULFATE 4 GM/100ML IV SOLN
4.0000 g | Freq: Once | INTRAVENOUS | Status: AC
  Administered 2015-09-10: 4 g via INTRAVENOUS
  Filled 2015-09-10: qty 100

## 2015-09-10 MED ORDER — CLONIDINE HCL 0.1 MG/24HR TD PTWK: 0.1000 mg | MEDICATED_PATCH | TRANSDERMAL | Status: DC

## 2015-09-10 MED ORDER — POTASSIUM CHLORIDE CRYS ER 20 MEQ PO TBCR
40.0000 meq | EXTENDED_RELEASE_TABLET | Freq: Once | ORAL | Status: AC
  Administered 2015-09-11: 40 meq via ORAL
  Filled 2015-09-10: qty 4

## 2015-09-10 MED ORDER — PROPRANOLOL HCL 10 MG PO TABS
10.0000 mg | ORAL_TABLET | Freq: Two times a day (BID) | ORAL | Status: DC
  Administered 2015-09-10 – 2015-09-12 (×5): 10 mg via ORAL
  Filled 2015-09-10 (×5): qty 1

## 2015-09-10 MED ORDER — SODIUM CHLORIDE 0.9 % IV BOLUS (SEPSIS)
1000.0000 mL | Freq: Once | INTRAVENOUS | Status: AC
  Administered 2015-09-10: 1000 mL via INTRAVENOUS

## 2015-09-10 MED ORDER — POTASSIUM CHLORIDE CRYS ER 20 MEQ PO TBCR
40.0000 meq | EXTENDED_RELEASE_TABLET | Freq: Once | ORAL | Status: AC
  Administered 2015-09-10: 40 meq via ORAL
  Filled 2015-09-10: qty 2

## 2015-09-10 MED ORDER — DEXTROSE 5 % IV SOLN
500.0000 mg | INTRAVENOUS | Status: DC
  Administered 2015-09-10: 500 mg via INTRAVENOUS
  Filled 2015-09-10: qty 500

## 2015-09-10 MED ORDER — ALUM & MAG HYDROXIDE-SIMETH 200-200-20 MG/5ML PO SUSP
15.0000 mL | Freq: Four times a day (QID) | ORAL | Status: DC | PRN
  Administered 2015-09-12 – 2015-09-15 (×5): 30 mL via ORAL
  Filled 2015-09-10 (×6): qty 30

## 2015-09-10 MED ORDER — IBUPROFEN 200 MG PO TABS
600.0000 mg | ORAL_TABLET | Freq: Four times a day (QID) | ORAL | Status: DC | PRN
  Administered 2015-09-10 – 2015-09-11 (×2): 600 mg via ORAL
  Filled 2015-09-10 (×2): qty 3

## 2015-09-10 MED ORDER — DICYCLOMINE HCL 20 MG PO TABS
20.0000 mg | ORAL_TABLET | Freq: Four times a day (QID) | ORAL | Status: DC | PRN
  Administered 2015-09-10 – 2015-09-11 (×2): 20 mg via ORAL
  Filled 2015-09-10 (×3): qty 1

## 2015-09-10 MED ORDER — POTASSIUM CHLORIDE IN NACL 20-0.9 MEQ/L-% IV SOLN
INTRAVENOUS | Status: DC
  Administered 2015-09-10: 19:00:00 via INTRAVENOUS
  Filled 2015-09-10 (×2): qty 1000

## 2015-09-10 MED ORDER — FAMOTIDINE IN NACL 20-0.9 MG/50ML-% IV SOLN
20.0000 mg | Freq: Two times a day (BID) | INTRAVENOUS | Status: DC
  Administered 2015-09-10 – 2015-09-11 (×2): 20 mg via INTRAVENOUS
  Filled 2015-09-10 (×4): qty 50

## 2015-09-10 MED ORDER — SODIUM CHLORIDE 0.9 % IV BOLUS (SEPSIS): 1000.0000 mL | Freq: Once | INTRAVENOUS | Status: DC

## 2015-09-10 MED ORDER — DEXTROSE 5 % IV SOLN
1.0000 g | INTRAVENOUS | Status: DC
  Administered 2015-09-10: 1 g via INTRAVENOUS
  Filled 2015-09-10: qty 10

## 2015-09-10 MED ORDER — ENOXAPARIN SODIUM 40 MG/0.4ML ~~LOC~~ SOLN
40.0000 mg | SUBCUTANEOUS | Status: DC
  Administered 2015-09-10 – 2015-09-15 (×6): 40 mg via SUBCUTANEOUS
  Filled 2015-09-10 (×6): qty 0.4

## 2015-09-10 MED ORDER — POTASSIUM CHLORIDE 10 MEQ/100ML IV SOLN
10.0000 meq | Freq: Once | INTRAVENOUS | Status: AC
  Administered 2015-09-10: 10 meq via INTRAVENOUS
  Filled 2015-09-10: qty 100

## 2015-09-10 MED ORDER — POTASSIUM CHLORIDE 10 MEQ/100ML IV SOLN
10.0000 meq | INTRAVENOUS | Status: DC
  Administered 2015-09-10: 10 meq via INTRAVENOUS
  Filled 2015-09-10: qty 100

## 2015-09-10 NOTE — ED Notes (Signed)
Pt can go up at 13:47.

## 2015-09-10 NOTE — ED Notes (Signed)
Patient arrived via [facility]. She's currently a resident at [facility] being treated for EToH, Benzo, and Opiate detox.  Staff at facility report patient has altered mental status and has had two falls in the last two days. Staff said patient did not hit her head or have LOC, however L-Ankle is swollen/painful.  Initial BP was 80 systolic but has increased to 110/70 pulse has been in the 120bpm range throughout transport.

## 2015-09-10 NOTE — ED Notes (Signed)
Bed: [id number] Expected date:  Expected time:  Means of arrival:  Comments: EMS 55yo F Fall with multiple complaint from [facility]

## 2015-09-10 NOTE — Clinical Social Work Note (Signed)
Clinical Social Work Assessment  Patient Details  Name: Kathy [name] MRN: [id number] Date of Birth: Oct 05, 1960  Date of referral:  09/10/15               Reason for consult:  Other (Comment Required) (Pt from [facility])                Permission sought to share information with:    Permission granted to share information::     Name::        Agency::     Relationship::     Contact Information:     Housing/Transportation Living arrangements for the past 2 months:   (Pt states she has been at [facility] for six days) Source of Information:  Patient Patient Interpreter Needed:  None Criminal Activity/Legal Involvement Pertinent to Current Situation/Hospitalization:    Significant Relationships:  Other Family Members (Two daughters, Lilia Pro and Mey Sulak) Lives with:    Do you feel safe going back to the place where you live?  Yes (Pt states she likes the facility) Need for family participation in patient care:     Care giving concerns: Unknown   Social Worker assessment / plan: CSW spoke with patient at bedside no family present. Patient reports when she got up to walk, she twisted her ankle. Patient reports she has been a resident at [facility] for the past six days in detox for alcohol, however, she states she will stay for thirty days. Patient reports she resided at home before going into the facility. Patient reports she was able to complete her ADL's prior two twisting her ankle. Patient reports she has two daughters, Lilia Pro and Kimeka Bina. Patient reports she is continues to be employed. No questions noted for CSW at this time.  Employment status:  Other (Comment) (Pt states she is still employed) Forensic scientist:  Other (Comment Required) Nurse, mental health) PT Recommendations:    Information / Referral to community resources:     Patient/Family's Response to care: Unknown  Patient/Family's Understanding of and Emotional Response to Diagnosis, Current  Treatment, and Prognosis: Unknown  Emotional Assessment Appearance:  Appears older than stated age Attitude/Demeanor/Rapport:    Affect (typically observed):    Orientation:    Alcohol / Substance use:  Other (Pt states she is in the detox department of the facility at [facility]) Psych involvement (Current and /or in the community):  No (Comment)  Discharge Needs  Concerns to be addressed:    Readmission within the last 30 days:    Current discharge risk:    Barriers to Discharge:      Guadelupe Sabin, LCSW 09/10/2015, 2:11 PM

## 2015-09-10 NOTE — ED Provider Notes (Signed)
CSN: [id number]     Arrival date & time 09/10/15  0911 History   First MD Initiated Contact with Patient 09/10/15 0920     Chief Complaint  Patient presents with  . Altered Mental Status  . Fall      Patient is a 55 y.o. female presenting with altered mental status and fall.  Altered Mental Status Fall  Patient presents after a fall. She is at [employer] for alcohol withdrawal/abuse. States she hasn't had a drink in over a week. States she is doing well with that. Heart rate is going little fast. States she does go fast sometimes. She's had previous nausea vomiting and some electrolyte abnormalities. Seen at [facility] for the same. Patient states she also has neuropathy in her feet. States that this feeling much better after the medicine. Denies nausea vomiting now. Denies confusion. States that her only problems with her left ankle. States she twisted going down the stairs last night. States she did not hit her head and eyes no other pains from the fall. Recently treated for urinary tract infection also.  Past Medical History  Diagnosis Date  . Hernia of abdominal cavity   . Alcohol abuse   . Depression   . Chicken pox   . Colon polyps   . Hip fracture ([place]) right  . Neuropathy ([place])    History reviewed. No pertinent past surgical history. Family History  Problem Relation Age of Onset  . Hypertension Mother   . Cancer Father 24    prostate   Social History  Substance Use Topics  . Smoking status: Current Every Day Smoker -- 0.15 packs/day    Types: Cigarettes  . Smokeless tobacco: Never Used     Comment: smokes one pack/week  . Alcohol Use: 12.6 oz/week    21 Standard drinks or equivalent per week     Comment: Patient states she is cutting back   OB History    No data available     Review of Systems    Allergies  Aspirin  Home Medications   Prior to Admission medications   Medication Sig Start Date End Date Taking? Authorizing Provider  alum &  mag hydroxide-simeth (MAALOX/MYLANTA) 200-200-20 MG/5ML suspension Take 15-30 mLs by mouth every 6 (six) hours as needed for indigestion or heartburn.   Yes Historical Provider, MD  benzonatate (TESSALON) 100 MG capsule Take 200 mg by mouth 3 (three) times daily as needed for cough.   Yes Historical Provider, MD  cetirizine (ZYRTEC) 10 MG tablet Take 10 mg by mouth daily.   Yes Historical Provider, MD  cloNIDine (CATAPRES - DOSED IN MG/24 HR) 0.1 mg/24hr patch Place 0.1 mg onto the skin once a week. Placed 09/08/15   Yes Historical Provider, MD  dicyclomine (BENTYL) 20 MG tablet Take 20 mg by mouth every 6 (six) hours as needed for spasms.   Yes Historical Provider, MD  Fructose-Dextrose-Phosphor Acd (NAUSEA CONTROL PO) Take 15-30 mLs by mouth daily as needed (for nausea).   Yes Historical Provider, MD  gabapentin (NEURONTIN) 400 MG capsule Take 400 mg by mouth 3 (three) times daily.   Yes Historical Provider, MD  guaiFENesin (MUCINEX) 600 MG 12 hr tablet Take 600 mg by mouth 2 (two) times daily as needed for cough or to loosen phlegm.   Yes Historical Provider, MD  ibuprofen (ADVIL,MOTRIN) 600 MG tablet Take 600 mg by mouth every 6 (six) hours as needed for fever or moderate pain.   Yes Historical Provider, MD  loperamide (IMODIUM A-D) 2 MG tablet Take 2 mg by mouth 4 (four) times daily as needed for diarrhea or loose stools.   Yes Historical Provider, MD  methocarbamol (ROBAXIN) 500 MG tablet Take 500 mg by mouth every 6 (six) hours as needed for muscle spasms. 10 days started 09/08/15   Yes Historical Provider, MD  omeprazole (PRILOSEC) 40 MG capsule Take 1 capsule (40 mg total) by mouth 2 (two) times daily. 04/22/15  Yes Crecencio Mc, MD  ondansetron (ZOFRAN) 8 MG tablet Take 8 mg by mouth 2 (two) times daily as needed for nausea or vomiting.   Yes Historical Provider, MD  OVER THE COUNTER MEDICATION Take 1 tablet by mouth daily. Multivitamin with folic acid   Yes Historical Provider, MD  Probiotic  Product (PROBIOTIC PO) Take 1 capsule by mouth daily.   Yes Historical Provider, MD  QUEtiapine (SEROQUEL) 50 MG tablet Take 50 mg by mouth at bedtime.   Yes Historical Provider, MD  simethicone (MYLICON) 80 MG chewable tablet Chew 160 mg by mouth every 6 (six) hours as needed for flatulence.   Yes Historical Provider, MD  thiamine 100 MG tablet Take 100 mg by mouth daily.   Yes Historical Provider, MD  ALPRAZolam Duanne Moron) 0.5 MG tablet Take 1 tablet (0.5 mg total) by mouth 2 (two) times daily as needed for anxiety. Patient not taking: Reported on 09/10/2015 07/31/15   Crecencio Mc, MD  cephALEXin (KEFLEX) 500 MG capsule Take 1 capsule (500 mg total) by mouth 4 (four) times daily. Patient not taking: Reported on 09/10/2015 09/03/15   Eula Listen, MD  mirtazapine (REMERON) 15 MG tablet Take 1 tablet (15 mg total) by mouth at bedtime. Patient not taking: Reported on 09/05/2015 07/31/15   Crecencio Mc, MD  potassium chloride SA (KLOR-CON M20) 20 MEQ tablet Take 1 tablet (20 mEq total) by mouth 2 (two) times daily. Patient not taking: Reported on 09/05/2015 08/16/15   Harvest Dark, MD  pregabalin (LYRICA) 50 MG capsule Take 1 capsule (50 mg total) by mouth 3 (three) times daily. Patient not taking: Reported on 09/05/2015 08/16/15 08/15/16  Harvest Dark, MD  promethazine (PHENERGAN) 12.5 MG tablet Take 1 tablet (12.5 mg total) by mouth every 6 (six) hours as needed for nausea or vomiting. Patient not taking: Reported on 09/10/2015 09/03/15   Eula Listen, MD  promethazine (PHENERGAN) 25 MG suppository Place 1 suppository (25 mg total) rectally every 6 (six) hours as needed for nausea. Patient not taking: Reported on 09/10/2015 09/03/15 09/02/16  Eula Listen, MD  propranolol (INDERAL) 60 MG tablet Take 1 tablet (60 mg total) by mouth daily. Patient not taking: Reported on 09/05/2015 07/31/15   Crecencio Mc, MD  traMADol (ULTRAM) 50 MG tablet Take 1 tablet (50 mg total) by mouth 2  (two) times daily. If needed for toe pain . Patient not taking: Reported on 09/10/2015 08/26/15   Crecencio Mc, MD   BP 141/107 mmHg  Pulse 115  Temp(Src) 99 F (37.2 C) (Oral)  Resp 18  SpO2 95% Physical Exam  Constitutional: She appears well-developed and well-nourished.  HENT:  Head: Atraumatic.  Eyes: EOM are normal.  Neck: Neck supple.  Cardiovascular:  Mild regular tachycardia  Pulmonary/Chest: Effort normal.  Abdominal: Soft. There is no tenderness.  Musculoskeletal:  Tenderness with ecchymosis and swelling to left ankle. Worse anterior and laterally. Neurovascularly intact in foot.  Neurological: She is alert.  Patient is awake and appropriate.  Skin: Skin is warm.  ED Course  Procedures (including critical care time) Labs Review Labs Reviewed  COMPREHENSIVE METABOLIC PANEL - Abnormal; Notable for the following:    Sodium 127 (*)    Potassium 2.0 (*)    Chloride 89 (*)    Glucose, Bld 117 (*)    Calcium 8.6 (*)    Albumin 3.0 (*)    AST 49 (*)    All other components within normal limits  CBC WITH DIFFERENTIAL/PLATELET - Abnormal; Notable for the following:    WBC 19.8 (*)    RBC 3.31 (*)    HCT 34.0 (*)    MCV 102.7 (*)    MCH 36.9 (*)    Neutro Abs 17.1 (*)    Monocytes Absolute 1.5 (*)    All other components within normal limits  URINALYSIS, ROUTINE W REFLEX MICROSCOPIC (NOT AT [facility]) - Abnormal; Notable for the following:    APPearance CLOUDY (*)    Nitrite POSITIVE (*)    Leukocytes, UA MODERATE (*)    All other components within normal limits  MAGNESIUM - Abnormal; Notable for the following:    Magnesium 0.8 (*)    All other components within normal limits  URINE MICROSCOPIC-ADD ON - Abnormal; Notable for the following:    Squamous Epithelial / LPF 0-5 (*)    Bacteria, UA MANY (*)    All other components within normal limits  URINE RAPID DRUG SCREEN, HOSP PERFORMED - Abnormal; Notable for the following:    Benzodiazepines POSITIVE (*)     All other components within normal limits  URINE CULTURE  ETHANOL    Imaging Review Dg Ankle Complete Left  09/10/2015  CLINICAL DATA:  55 year old female fell last night with left ankle injury. Anterior lateral pain. Swelling. Initial encounter. EXAM: LEFT ANKLE COMPLETE - 3+ VIEW COMPARISON:  None. FINDINGS: Oblique slightly displaced fracture left lateral malleolus with adjacent soft tissue swelling. No other fracture noted. Ankle mortise appears intact. Plantar spur. IMPRESSION: Oblique slightly displaced fracture left lateral malleolus with adjacent soft tissue swelling. Electronically Signed   By: Genia Del M.D.   On: 09/10/2015 10:09   Ct Head Wo Contrast  09/10/2015  CLINICAL DATA:  Fall EXAM: CT HEAD WITHOUT CONTRAST TECHNIQUE: Contiguous axial images were obtained from the base of the skull through the vertex without intravenous contrast. COMPARISON:  None. FINDINGS: No skull fracture is noted. Paranasal sinuses and mastoid air cells are unremarkable. No intracranial hemorrhage, mass effect or midline shift. Mild cerebral atrophy. No acute cortical infarction. No mass lesion is noted on this unenhanced scan. No hydrocephalus. No intra or extra-axial fluid collection. IMPRESSION: No acute intracranial abnormality.  Mild cerebral atrophy. Electronically Signed   By: Lahoma Crocker M.D.   On: 09/10/2015 12:51   I have personally reviewed and evaluated these images and lab results as part of my medical decision-making.   EKG Interpretation None      MDM   Final diagnoses:  Hypokalemia  Hypomagnesemia  Ankle fracture, left, closed, initial encounter  Patient presents with ankle injury. Has lateral malleolus fracture. Discussed with Dr. Tamera Punt. Nonweightbearing with elevation. Already splinted. Follow-up in a week after she is discharged. Likely nonoperative. Also has hypokalemia and hypomagnesemia. His artery on oral supplementation. Will admit for further supplementation. Discussed  with Dr. Erlinda Hong. Urine abnormalities. No dysuria. White count is elevating. Urine culture sent.    Davonna Belling, MD 09/10/15 1318

## 2015-09-10 NOTE — H&P (Signed)
History and Physical  Kathy [name] [id number] DOB: January 08, 1961 DOA: 09/10/2015  Referring physician: EDP PCP: Crecencio Mc, MD   Chief Complaint: mechanical fall with left ankle pain  HPI: Kathy [name] is a 55 y.o. female   H/o alcohol dependent with significant peripheral neuropathy, h/o bilateral hip fractures which did not require surgery per patient, h/o hepatic steatosis, depression, hiatal hernia with stomach herniated into the chest by CT scan done in 2016, paraesophageal hernia, GERD, she was evaluated by [facility] General surgery in 2016 for possible laparoscopic repair with nissen fundoplication who presented to the ED due to fall with left ankle pain.   ED course: she has sinus tachycardia, afebrile, bp stable, not in respiratory distress, left ankle x ray with Oblique slightly displaced fracture left lateral malleolus with adjacent soft tissue swelling. her left ankle was splinted, EDP contacted ortho who recommended NWB with elevation and follow up in a week with ortho, labs is significant for na 127, k 2.0, mag 1.0, wbc 19.8. hospitalist called to admit the patient.  Patient reported she has been having intermittent n/v, she stopped drinking alcohol 6days ago when [facility]. She has chronic intermittent cough, she also reported was seen at [facility] on 7/10 and was on abx for uti which she finished treatment. Patient currently has slight confusion, not a very reliable historian, she denies pain, no n/v today, no diarrhea, denies dysuria, but does report urinary frequency.  Review of Systems:  Detail per HPI, Review of systems are otherwise negative  Past Medical History  Diagnosis Date  . Hernia of abdominal cavity   . Alcohol abuse   . Depression   . Chicken pox   . Colon polyps   . Hip fracture ([place]) right  . Neuropathy ([place])    History reviewed. No pertinent past surgical history. Social History:  reports that she has been smoking  Cigarettes.  She has been smoking about 0.15 packs per day. She has never used smokeless tobacco. She reports that she drinks about 12.6 oz of alcohol per week. She reports that she does not use illicit drugs. Patient is from [facility] & is able to participate in activities of daily living independently   Allergies  Allergen Reactions  . Aspirin Swelling    unknown    Family History  Problem Relation Age of Onset  . Hypertension Mother   . Cancer Father 80    prostate      Prior to Admission medications   Medication Sig Start Date End Date Taking? Authorizing Provider  alum & mag hydroxide-simeth (MAALOX/MYLANTA) 200-200-20 MG/5ML suspension Take 15-30 mLs by mouth every 6 (six) hours as needed for indigestion or heartburn.   Yes Historical Provider, MD  benzonatate (TESSALON) 100 MG capsule Take 200 mg by mouth 3 (three) times daily as needed for cough.   Yes Historical Provider, MD  cetirizine (ZYRTEC) 10 MG tablet Take 10 mg by mouth daily.   Yes Historical Provider, MD  cloNIDine (CATAPRES - DOSED IN MG/24 HR) 0.1 mg/24hr patch Place 0.1 mg onto the skin once a week. Placed 09/08/15   Yes Historical Provider, MD  dicyclomine (BENTYL) 20 MG tablet Take 20 mg by mouth every 6 (six) hours as needed for spasms.   Yes Historical Provider, MD  Fructose-Dextrose-Phosphor Acd (NAUSEA CONTROL PO) Take 15-30 mLs by mouth daily as needed (for nausea).   Yes Historical Provider, MD  gabapentin (NEURONTIN) 400 MG capsule Take 400 mg  by mouth 3 (three) times daily.   Yes Historical Provider, MD  guaiFENesin (MUCINEX) 600 MG 12 hr tablet Take 600 mg by mouth 2 (two) times daily as needed for cough or to loosen phlegm.   Yes Historical Provider, MD  ibuprofen (ADVIL,MOTRIN) 600 MG tablet Take 600 mg by mouth every 6 (six) hours as needed for fever or moderate pain.   Yes Historical Provider, MD  loperamide (IMODIUM A-D) 2 MG tablet Take 2 mg by mouth 4 (four) times daily as needed for diarrhea  or loose stools.   Yes Historical Provider, MD  methocarbamol (ROBAXIN) 500 MG tablet Take 500 mg by mouth every 6 (six) hours as needed for muscle spasms. 10 days started 09/08/15   Yes Historical Provider, MD  omeprazole (PRILOSEC) 40 MG capsule Take 1 capsule (40 mg total) by mouth 2 (two) times daily. 04/22/15  Yes Crecencio Mc, MD  ondansetron (ZOFRAN) 8 MG tablet Take 8 mg by mouth 2 (two) times daily as needed for nausea or vomiting.   Yes Historical Provider, MD  OVER THE COUNTER MEDICATION Take 1 tablet by mouth daily. Multivitamin with folic acid   Yes Historical Provider, MD  Probiotic Product (PROBIOTIC PO) Take 1 capsule by mouth daily.   Yes Historical Provider, MD  QUEtiapine (SEROQUEL) 50 MG tablet Take 50 mg by mouth at bedtime.   Yes Historical Provider, MD  simethicone (MYLICON) 80 MG chewable tablet Chew 160 mg by mouth every 6 (six) hours as needed for flatulence.   Yes Historical Provider, MD  thiamine 100 MG tablet Take 100 mg by mouth daily.   Yes Historical Provider, MD  ALPRAZolam Duanne Moron) 0.5 MG tablet Take 1 tablet (0.5 mg total) by mouth 2 (two) times daily as needed for anxiety. Patient not taking: Reported on 09/10/2015 07/31/15   Crecencio Mc, MD  cephALEXin (KEFLEX) 500 MG capsule Take 1 capsule (500 mg total) by mouth 4 (four) times daily. Patient not taking: Reported on 09/10/2015 09/03/15   Eula Listen, MD  mirtazapine (REMERON) 15 MG tablet Take 1 tablet (15 mg total) by mouth at bedtime. Patient not taking: Reported on 09/05/2015 07/31/15   Crecencio Mc, MD  potassium chloride SA (KLOR-CON M20) 20 MEQ tablet Take 1 tablet (20 mEq total) by mouth 2 (two) times daily. Patient not taking: Reported on 09/05/2015 08/16/15   Harvest Dark, MD  pregabalin (LYRICA) 50 MG capsule Take 1 capsule (50 mg total) by mouth 3 (three) times daily. Patient not taking: Reported on 09/05/2015 08/16/15 08/15/16  Harvest Dark, MD  promethazine (PHENERGAN) 12.5 MG tablet  Take 1 tablet (12.5 mg total) by mouth every 6 (six) hours as needed for nausea or vomiting. Patient not taking: Reported on 09/10/2015 09/03/15   Eula Listen, MD  promethazine (PHENERGAN) 25 MG suppository Place 1 suppository (25 mg total) rectally every 6 (six) hours as needed for nausea. Patient not taking: Reported on 09/10/2015 09/03/15 09/02/16  Eula Listen, MD  propranolol (INDERAL) 60 MG tablet Take 1 tablet (60 mg total) by mouth daily. Patient not taking: Reported on 09/05/2015 07/31/15   Crecencio Mc, MD  traMADol (ULTRAM) 50 MG tablet Take 1 tablet (50 mg total) by mouth 2 (two) times daily. If needed for toe pain . Patient not taking: Reported on 09/10/2015 08/26/15   Crecencio Mc, MD    Physical Exam: BP 141/107 mmHg  Pulse 115  Temp(Src) 99 F (37.2 C) (Oral)  Resp 18  SpO2 95%  General:  Look dehydrated, slightly confused, oriented x3, poor short term memory Eyes: PERRL ENT: unremarkable Neck: supple, no JVD Cardiovascular: tachycardia Respiratory: crackles right low basis, no wheezing, no rhonchi Abdomen: soft/ND/ND, positive bowel sounds Skin: no rash Musculoskeletal:  Left ankle with splint Psychiatric: calm/cooperative Neurologic: no focal findings  , but slightly confused          Labs on Admission:  Basic Metabolic Panel:  Recent Labs Lab 09/05/15 0825 09/10/15 0954  NA 131* 127*  K 3.0* 2.0*  CL 95* 89*  CO2 30 26  GLUCOSE 137* 117*  BUN 2* 6  CREATININE 0.47 0.82  CALCIUM 9.1 8.6*  MG  --  0.8*   Liver Function Tests:  Recent Labs Lab 09/05/15 0825 09/10/15 0954  AST 57* 49*  ALT 22 21  ALKPHOS 125* 105  BILITOT 0.8 0.8  PROT 7.1 6.8  ALBUMIN 3.3* 3.0*   No results for input(s): LIPASE, AMYLASE in the last 168 hours. No results for input(s): AMMONIA in the last 168 hours. CBC:  Recent Labs Lab 09/05/15 0825 09/10/15 0954  WBC 9.2 19.8*  NEUTROABS 5.8 17.1*  HGB 13.4 12.2  HCT 39.9 34.0*  MCV 110.6 Repeated  and verified X2.* 102.7*  PLT 237.0 290   Cardiac Enzymes: No results for input(s): CKTOTAL, CKMB, CKMBINDEX, TROPONINI in the last 168 hours.  BNP (last 3 results) No results for input(s): BNP in the last 8760 hours.  ProBNP (last 3 results) No results for input(s): PROBNP in the last 8760 hours.  CBG: No results for input(s): GLUCAP in the last 168 hours.  Radiological Exams on Admission: Dg Ankle Complete Left  09/10/2015  CLINICAL DATA:  55 year old female fell last night with left ankle injury. Anterior lateral pain. Swelling. Initial encounter. EXAM: LEFT ANKLE COMPLETE - 3+ VIEW COMPARISON:  None. FINDINGS: Oblique slightly displaced fracture left lateral malleolus with adjacent soft tissue swelling. No other fracture noted. Ankle mortise appears intact. Plantar spur. IMPRESSION: Oblique slightly displaced fracture left lateral malleolus with adjacent soft tissue swelling. Electronically Signed   By: Genia Del M.D.   On: 09/10/2015 10:09    EKG: not obtained in the ED, now ordered , pending  Assessment/Plan Present on Admission:  **None**  Hyponatremia/hypokalemia/hypomagnesemia: likely from dehydration and chronic alcohol use. She also reported n/v with h/o hiatal hernia, will need hydration, replace electrolytes, will need continue follow with [facility] for possible intervention if dehydration persist and is from gi issues.  Sepsis?, no fever, she does has leukocytosis, sinus tachycardia but all these could be from dehydration as well, she does has cough, on exam right lower lobe crackles, chronic aspiration from acid reflux? Vs CAP, will check urine strep pneumo/legionella antigen, empirically start rocephin/zithro for now. cxr pending UA with many bacteria, +nitrite but no wbc, urine culture pending.   Fall with right ankle fracture: on splint, ortho Dr Tamera Punt recommended NWB and outpatient follow up.   H/o HTN? Propranolol listed at one of her medicine, but she states  has not been taking it, will start low dose in the setting of tachycardia.  GERD: on pepcid  Alcohol abuse, reported last drink 6days ago, she has been at [facility] and went through withdrawal,  has not been drinking.   Smoker, cessation education provided, she declined nicotine patch   DVT prophylaxis: lovenox  Consultants: none  Code Status: full   Family Communication:  Patient   Disposition Plan: admit to med tele obs, likely able to d/c in  the next 24-48hrs  Time spent: 46mins  Taven Strite MD, PhD [facility] Pager [phone] If 7PM-7AM, please contact night-coverage at [email or web address], password [facility]

## 2015-09-10 NOTE — ED Notes (Signed)
Made Ortho aware of splint ordered

## 2015-09-11 ENCOUNTER — Other Ambulatory Visit: Payer: Self-pay

## 2015-09-11 DIAGNOSIS — Z8781 Personal history of (healed) traumatic fracture: Secondary | ICD-10-CM

## 2015-09-11 DIAGNOSIS — R6510 Systemic inflammatory response syndrome (SIRS) of non-infectious origin without acute organ dysfunction: Secondary | ICD-10-CM

## 2015-09-11 DIAGNOSIS — A419 Sepsis, unspecified organism: Secondary | ICD-10-CM

## 2015-09-11 DIAGNOSIS — S82892A Other fracture of left lower leg, initial encounter for closed fracture: Secondary | ICD-10-CM

## 2015-09-11 LAB — URINE CULTURE

## 2015-09-11 LAB — STREP PNEUMONIAE URINARY ANTIGEN: Strep Pneumo Urinary Antigen: NEGATIVE

## 2015-09-11 LAB — BASIC METABOLIC PANEL
Anion gap: 5 (ref 5–15)
BUN: <5 mg/dL — ABNORMAL LOW (ref 6–20)
CO2: 24 mmol/L (ref 22–32)
Calcium: 8 mg/dL — ABNORMAL LOW (ref 8.9–10.3)
Chloride: 106 mmol/L (ref 101–111)
Creatinine, Ser: 0.4 mg/dL — ABNORMAL LOW (ref 0.44–1.00)
GFR calc Af Amer: >60 mL/min (ref >60)
GFR calc non Af Amer: >60 mL/min (ref >60)
Glucose, Bld: 95 mg/dL (ref 65–99)
Potassium: 3.4 mmol/L — ABNORMAL LOW (ref 3.5–5.1)
Sodium: 135 mmol/L (ref 135–145)

## 2015-09-11 LAB — RAPID URINE DRUG SCREEN, HOSP PERFORMED
Amphetamines: NOT DETECTED
Barbiturates: NOT DETECTED
Benzodiazepines: POSITIVE — AB
Cocaine: NOT DETECTED
Opiates: NOT DETECTED
Tetrahydrocannabinol: NOT DETECTED

## 2015-09-11 LAB — HIV ANTIBODY (ROUTINE TESTING W REFLEX): HIV Screen 4th Generation wRfx: NONREACTIVE

## 2015-09-11 LAB — MAGNESIUM: Magnesium: 1.7 mg/dL (ref 1.7–2.4)

## 2015-09-11 MED ORDER — TRAMADOL HCL 50 MG PO TABS
50.0000 mg | ORAL_TABLET | Freq: Two times a day (BID) | ORAL | Status: DC
  Administered 2015-09-11: 50 mg via ORAL
  Filled 2015-09-11: qty 1

## 2015-09-11 MED ORDER — HALOPERIDOL LACTATE 5 MG/ML IJ SOLN
2.0000 mg | Freq: Four times a day (QID) | INTRAMUSCULAR | Status: DC | PRN
  Administered 2015-09-11 – 2015-09-12 (×2): 2 mg via INTRAVENOUS
  Filled 2015-09-11 (×3): qty 1

## 2015-09-11 MED ORDER — CHLORDIAZEPOXIDE HCL 5 MG PO CAPS: 25.0000 mg | ORAL_CAPSULE | Freq: Every day | ORAL | Status: DC

## 2015-09-11 MED ORDER — HALOPERIDOL LACTATE 5 MG/ML IJ SOLN
2.0000 mg | Freq: Once | INTRAMUSCULAR | Status: AC
Start: 2015-09-11 — End: 2015-09-11
  Administered 2015-09-11: 2 mg via INTRAVENOUS
  Filled 2015-09-11: qty 1

## 2015-09-11 MED ORDER — HYDROXYZINE HCL 25 MG PO TABS: 25.0000 mg | ORAL_TABLET | Freq: Four times a day (QID) | ORAL | Status: DC | PRN

## 2015-09-11 MED ORDER — LORAZEPAM 2 MG/ML IJ SOLN
1.0000 mg | Freq: Once | INTRAMUSCULAR | Status: AC
  Administered 2015-09-11: 1 mg via INTRAVENOUS
  Filled 2015-09-11: qty 1

## 2015-09-11 MED ORDER — ADULT MULTIVITAMIN W/MINERALS CH
1.0000 | ORAL_TABLET | Freq: Every day | ORAL | Status: DC
  Administered 2015-09-11 – 2015-09-16 (×6): 1 via ORAL
  Filled 2015-09-11 (×6): qty 1

## 2015-09-11 MED ORDER — CHLORDIAZEPOXIDE HCL 5 MG PO CAPS: 25.0000 mg | ORAL_CAPSULE | ORAL | Status: DC

## 2015-09-11 MED ORDER — ONDANSETRON 4 MG PO TBDP
4.0000 mg | ORAL_TABLET | Freq: Four times a day (QID) | ORAL | Status: AC | PRN
Start: 2015-09-11 — End: 2015-09-14

## 2015-09-11 MED ORDER — VITAMIN B-1 100 MG PO TABS
100.0000 mg | ORAL_TABLET | Freq: Every day | ORAL | Status: DC
Start: 2015-09-12 — End: 2015-09-16
  Administered 2015-09-12 – 2015-09-16 (×5): 100 mg via ORAL
  Filled 2015-09-11 (×5): qty 1

## 2015-09-11 MED ORDER — ALPRAZOLAM 0.5 MG PO TABS: 0.5000 mg | ORAL_TABLET | Freq: Two times a day (BID) | ORAL | Status: DC | PRN

## 2015-09-11 MED ORDER — CHLORDIAZEPOXIDE HCL 5 MG PO CAPS: 25.0000 mg | ORAL_CAPSULE | Freq: Three times a day (TID) | ORAL | Status: DC

## 2015-09-11 MED ORDER — MIRTAZAPINE 15 MG PO TABS
15.0000 mg | ORAL_TABLET | Freq: Every day | ORAL | Status: DC
  Administered 2015-09-11 – 2015-09-15 (×5): 15 mg via ORAL
  Filled 2015-09-11 (×5): qty 1

## 2015-09-11 MED ORDER — LOPERAMIDE HCL 2 MG PO CAPS: 2.0000 mg | ORAL_CAPSULE | ORAL | Status: AC | PRN

## 2015-09-11 MED ORDER — HALOPERIDOL LACTATE 5 MG/ML IJ SOLN
1.0000 mg | Freq: Once | INTRAMUSCULAR | Status: AC | PRN
  Administered 2015-09-11: 1 mg via INTRAVENOUS
  Filled 2015-09-11: qty 1

## 2015-09-11 MED ORDER — QUETIAPINE FUMARATE 25 MG PO TABS: 50.0000 mg | ORAL_TABLET | Freq: Every day | ORAL | Status: DC

## 2015-09-11 MED ORDER — THIAMINE HCL 100 MG/ML IJ SOLN: 100.0000 mg | Freq: Once | INTRAMUSCULAR | Status: DC

## 2015-09-11 MED ORDER — QUETIAPINE FUMARATE 25 MG PO TABS
50.0000 mg | ORAL_TABLET | Freq: Every day | ORAL | Status: DC
  Administered 2015-09-11 – 2015-09-15 (×5): 50 mg via ORAL
  Filled 2015-09-11 (×5): qty 2

## 2015-09-11 MED ORDER — CHLORDIAZEPOXIDE HCL 5 MG PO CAPS: 25.0000 mg | ORAL_CAPSULE | Freq: Four times a day (QID) | ORAL | Status: DC

## 2015-09-11 MED ORDER — CHLORDIAZEPOXIDE HCL 5 MG PO CAPS: 25.0000 mg | ORAL_CAPSULE | Freq: Four times a day (QID) | ORAL | Status: DC | PRN

## 2015-09-11 MED ORDER — POTASSIUM CHLORIDE CRYS ER 20 MEQ PO TBCR
40.0000 meq | EXTENDED_RELEASE_TABLET | Freq: Two times a day (BID) | ORAL | Status: AC
  Administered 2015-09-11 (×2): 40 meq via ORAL
  Filled 2015-09-11 (×2): qty 2

## 2015-09-11 NOTE — NC FL2 (Signed)
[place] LEVEL OF CARE SCREENING TOOL     IDENTIFICATION  Patient Name: Kathy [name] Birthdate: 06-10-1960 Sex: female Admission Date (Current Location): 09/10/2015  [facility] and [place] Number:  Engineering geologist and Address:  [facility],  [address] [place], [place]      Provider Number: [id number]  Attending Physician Name and Address:  Charlynne Cousins, MD  Relative Name and Phone Number:       Current Level of Care: Hospital Recommended Level of Care: [place] Prior Approval Number:    Date Approved/Denied:   PASRR Number: [id number]  Discharge Plan: Home    Current Diagnoses: Patient Active Problem List   Diagnosis Date Noted  . SIRS (systemic inflammatory response syndrome) ([place]) 09/11/2015  . Closed left ankle fracture 09/11/2015  . Hypokalemia 09/10/2015  . Hyponatremia 09/10/2015  . 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 Hammertoe 07/31/2015  . Paroxysmal supraventricular tachycardia ([place]) 06/11/2015  . Encounter for preventive health examination 04/23/2015  . Neuropathy ([place]) 04/23/2015  . Intertrochanteric fracture of left femur ([place]) 02/05/2015  . Polycythemia, secondary 10/13/2014  . Fatty liver 10/13/2014  . Alcohol dependence with alcohol-induced mood disorder ([place])   . Alcohol withdrawal ([place]) 07/13/2014  . Alcohol dependence ([place]) 07/13/2014  . Alcohol-induced mood disorder ([place]) 07/13/2014  . Acidosis 07/12/2014    Orientation RESPIRATION BLADDER Height & Weight     Self, Time, Situation, Place  Normal Continent Weight: 106 lb (48.081 kg) Height:  5\' 1"  (154.9 cm)  BEHAVIORAL SYMPTOMS/MOOD NEUROLOGICAL BOWEL NUTRITION STATUS      Continent Diet (Regular)  AMBULATORY STATUS COMMUNICATION OF NEEDS Skin   Limited Assist Verbally Normal                       Personal Care Assistance Level of Assistance  Bathing, Dressing Bathing Assistance: Limited  assistance   Dressing Assistance: Limited assistance     Functional Limitations Info             SPECIAL CARE FACTORS FREQUENCY  PT (By licensed PT), OT (By licensed OT)     PT Frequency: 5 OT Frequency: 5            Contractures      Additional Factors Info  Code Status, Allergies Code Status Info: Fullcode Allergies Info: Aspirin           Current Medications (09/11/2015):  This is the current hospital active medication list Current Facility-Administered Medications  Medication Dose Route Frequency Provider Last Rate Last Dose  . alum & mag hydroxide-simeth (MAALOX/MYLANTA) 200-200-20 MG/5ML suspension 15-30 mL  15-30 mL Oral Q6H PRN Florencia Reasons, MD      . Derrill Memo ON 09/15/2015] cloNIDine (CATAPRES - Dosed in mg/24 hr) patch 0.1 mg  0.1 mg Transdermal Weekly Florencia Reasons, MD      . dicyclomine (BENTYL) tablet 20 mg  20 mg Oral Q6H PRN Florencia Reasons, MD   20 mg at 09/11/15 0145  . enoxaparin (LOVENOX) injection 40 mg  40 mg Subcutaneous Q24H Florencia Reasons, MD   40 mg at 09/10/15 2151  . famotidine (PEPCID) IVPB 20 mg premix  20 mg Intravenous Q12H Florencia Reasons, MD   20 mg at 09/10/15 2152  . gabapentin (NEURONTIN) capsule 400 mg  400 mg Oral TID Florencia Reasons, MD   400 mg at 09/11/15 1147  . guaiFENesin (MUCINEX) 12 hr tablet 600 mg  600 mg Oral BID  Florencia Reasons, MD   600 mg at 09/11/15 1146  . hydrOXYzine (ATARAX/VISTARIL) tablet 25 mg  25 mg Oral Q6H PRN Charlynne Cousins, MD      . ibuprofen (ADVIL,MOTRIN) tablet 600 mg  600 mg Oral Q6H PRN Florencia Reasons, MD   600 mg at 09/11/15 0145  . loperamide (IMODIUM) capsule 2-4 mg  2-4 mg Oral PRN Charlynne Cousins, MD      . loratadine (CLARITIN) tablet 10 mg  10 mg Oral Daily Florencia Reasons, MD   10 mg at 09/11/15 1147  . multivitamin with minerals tablet 1 tablet  1 tablet Oral Daily Charlynne Cousins, MD   1 tablet at 09/11/15 1147  . ondansetron (ZOFRAN-ODT) disintegrating tablet 4 mg  4 mg Oral Q6H PRN Charlynne Cousins, MD      . potassium chloride SA  (K-DUR,KLOR-CON) CR tablet 40 mEq  40 mEq Oral BID Charlynne Cousins, MD   40 mEq at 09/11/15 1146  . propranolol (INDERAL) tablet 10 mg  10 mg Oral BID Florencia Reasons, MD   10 mg at 09/11/15 1146  . sodium chloride 0.9 % bolus 1,000 mL  1,000 mL Intravenous Once Florencia Reasons, MD      . sodium chloride flush (NS) 0.9 % injection 3 mL  3 mL Intravenous Q12H Florencia Reasons, MD   3 mL at 09/10/15 2200  . thiamine (B-1) injection 100 mg  100 mg Intramuscular Once Charlynne Cousins, MD      . Derrill Memo ON 09/12/2015] thiamine (VITAMIN B-1) tablet 100 mg  100 mg Oral Daily Charlynne Cousins, MD         Discharge Medications: Please see discharge summary for a list of discharge medications.  Relevant Imaging Results:  Relevant Lab Results:   Additional Information SSN: [id number]  Standley Brooking, LCSW

## 2015-09-11 NOTE — Discharge Summary (Addendum)
Physician Discharge Summary  Kathy [name] [id number] DOB: [birth year] DOA: 09/10/2015  PCP: Crecencio Mc, MD  Admit date: 09/10/2015 Discharge date: 09/11/2015  Admitted From: [facility] Disposition:  Discharge to flush up all  Recommendations for Outpatient Follow-up:  1. Follow up with PCP in 1-2 weeks 2. Follow-up with orthopedic surgery in 2 weeks to further evaluate her nondisplaced ankle fracture.   Home Health:no Equipment/Devices:none  Discharge Condition:stable CODE STATUS:full  Diet recommendation: regular  Brief/Interim Summary: 55 year old with past medical history of alcohol abuse and peripheral alcoholic neuropathy, history of bilateral hip fracture that did not require surgery came to the ED with  with a left ankle pain after fall.  Discharge Diagnoses:  SIRS (systemic inflammatory response syndrome) ([place]): Daily due to trauma and close left ankle fracture she remained afebrile no cough or shortness of breath no burning when she urinates. Ultracet remain negative to date.  Closed left ankle fracture: Orthopedic surgery was consulted recommended conservative management follow-up with them in 2 weeks.  Sinus tachycardia: Likely due to stress now resolved.  Mild Hypokalemia/Hyponatremia Repleted orally now resolved.    Discharge Instructions      Discharge Instructions    Diet - low sodium heart healthy    Complete by:  As directed      Increase activity slowly    Complete by:  As directed             Medication List    STOP taking these medications        ALPRAZolam 0.5 MG tablet  Commonly known as:  XANAX     benzonatate 100 MG capsule  Commonly known as:  TESSALON     cephALEXin 500 MG capsule  Commonly known as:  KEFLEX     gabapentin 400 MG capsule  Commonly known as:  NEURONTIN     guaiFENesin 600 MG 12 hr tablet  Commonly known as:  MUCINEX     ibuprofen 600 MG tablet  Commonly known as:  ADVIL,MOTRIN      loperamide 2 MG tablet  Commonly known as:  IMODIUM A-D     NAUSEA CONTROL PO     OVER THE COUNTER MEDICATION     PROBIOTIC PO     promethazine 12.5 MG tablet  Commonly known as:  PHENERGAN     promethazine 25 MG suppository  Commonly known as:  PHENERGAN     propranolol 60 MG tablet  Commonly known as:  INDERAL     simethicone 80 MG chewable tablet  Commonly known as:  MYLICON     thiamine 123XX123 MG tablet      TAKE these medications        alum & mag hydroxide-simeth 200-200-20 MG/5ML suspension  Commonly known as:  MAALOX/MYLANTA  Take 15-30 mLs by mouth every 6 (six) hours as needed for indigestion or heartburn.     cetirizine 10 MG tablet  Commonly known as:  ZYRTEC  Take 10 mg by mouth daily.     cloNIDine 0.1 mg/24hr patch  Commonly known as:  CATAPRES - Dosed in mg/24 hr  Place 0.1 mg onto the skin once a week. Placed 09/08/15     dicyclomine 20 MG tablet  Commonly known as:  BENTYL  Take 20 mg by mouth every 6 (six) hours as needed for spasms.     methocarbamol 500 MG tablet  Commonly known as:  ROBAXIN  Take 500 mg by mouth every 6 (six) hours as needed for muscle spasms.  10 days started 09/08/15     mirtazapine 15 MG tablet  Commonly known as:  REMERON  Take 1 tablet (15 mg total) by mouth at bedtime.     omeprazole 40 MG capsule  Commonly known as:  PRILOSEC  Take 1 capsule (40 mg total) by mouth 2 (two) times daily.     ondansetron 8 MG tablet  Commonly known as:  ZOFRAN  Take 8 mg by mouth 2 (two) times daily as needed for nausea or vomiting.     potassium chloride SA 20 MEQ tablet  Commonly known as:  KLOR-CON M20  Take 1 tablet (20 mEq total) by mouth 2 (two) times daily.     pregabalin 50 MG capsule  Commonly known as:  LYRICA  Take 1 capsule (50 mg total) by mouth 3 (three) times daily.     QUEtiapine 50 MG tablet  Commonly known as:  SEROQUEL  Take 50 mg by mouth at bedtime.     traMADol 50 MG tablet  Commonly known as:  ULTRAM   Take 1 tablet (50 mg total) by mouth 2 (two) times daily. If needed for toe pain .       Follow-up Information    Follow up with Nita Sells, MD. Call in 2 weeks.   Specialty:  Orthopedic Surgery   Contact information:   [place] [address] [phone]      Allergies  Allergen Reactions  . Aspirin Swelling    unknown    Consultations:  Orthopedic surgery   Procedures/Studies: Dg Chest 2 View  09/10/2015  CLINICAL DATA:  Cough for 1 day EXAM: CHEST  2 VIEW COMPARISON:  August 24, 2015 FINDINGS: There is mild generalized interstitial thickening without frank edema or consolidation. Heart size and pulmonary vascularity are normal. No adenopathy. There is a focal hiatal hernia. No bone lesions. IMPRESSION: Focal hiatal hernia. Interstitium shows mild generalized thickening without frank edema or consolidation. This appearance is stable compared to recent prior study. Electronically Signed   By: Lowella Grip III M.D.   On: 09/10/2015 16:01   Dg Chest 2 View  08/24/2015  CLINICAL DATA:  Chest pain EXAM: CHEST  2 VIEW COMPARISON:  11/23/2013 FINDINGS: Heart size and vascularity normal. Lungs are clear without infiltrate or effusion. Moderate large hiatal hernia. Negative for mass or adenopathy IMPRESSION: Hiatal hernia.  No acute cardiopulmonary abnormality. Electronically Signed   By: Franchot Gallo M.D.   On: 08/24/2015 17:23   Dg Ankle Complete Left  09/10/2015  CLINICAL DATA:  55 year old female fell last night with left ankle injury. Anterior lateral pain. Swelling. Initial encounter. EXAM: LEFT ANKLE COMPLETE - 3+ VIEW COMPARISON:  None. FINDINGS: Oblique slightly displaced fracture left lateral malleolus with adjacent soft tissue swelling. No other fracture noted. Ankle mortise appears intact. Plantar spur. IMPRESSION: Oblique slightly displaced fracture left lateral malleolus with adjacent soft tissue swelling. Electronically Signed    By: Genia Del M.D.   On: 09/10/2015 10:09   Ct Head Wo Contrast  09/10/2015  CLINICAL DATA:  Fall EXAM: CT HEAD WITHOUT CONTRAST TECHNIQUE: Contiguous axial images were obtained from the base of the skull through the vertex without intravenous contrast. COMPARISON:  None. FINDINGS: No skull fracture is noted. Paranasal sinuses and mastoid air cells are unremarkable. No intracranial hemorrhage, mass effect or midline shift. Mild cerebral atrophy. No acute cortical infarction. No mass lesion is noted on this unenhanced scan. No hydrocephalus. No intra or extra-axial fluid collection. IMPRESSION:  No acute intracranial abnormality.  Mild cerebral atrophy. Electronically Signed   By: Lahoma Crocker M.D.   On: 09/10/2015 12:51   (Echo, Carotid, EGD, Colonoscopy, ERCP)    Subjective:   Discharge Exam: Filed Vitals:   09/10/15 2013 09/11/15 0548  BP: 130/79 118/43  Pulse: 94 88  Temp: 98.6 F (37 C) 97.7 F (36.5 C)  Resp: 18 16   Filed Vitals:   09/10/15 1410 09/10/15 1422 09/10/15 2013 09/11/15 0548  BP:  123/78 130/79 118/43  Pulse:  106 94 88  Temp:  98.4 F (36.9 C) 98.6 F (37 C) 97.7 F (36.5 C)  TempSrc:  Oral Oral Oral  Resp:  16 18 16   Height: 5\' 1"  (1.549 m)     Weight: 48.081 kg (106 lb)     SpO2:  97% 100% 99%    General: Pt is alert, awake, not in acute distress Cardiovascular: RRR, S1/S2 +, no rubs, no gallops Respiratory: CTA bilaterally, no wheezing, no rhonchi Abdominal: Soft, NT, ND, bowel sounds + Extremities: no edema, no cyanosis    The results of significant diagnostics from this hospitalization (including imaging, microbiology, ancillary and laboratory) are listed below for reference.     Microbiology: Recent Results (from the past 240 hour(s))  MRSA PCR Screening     Status: None   Collection Time: 09/10/15  6:57 PM  Result Value Ref Range Status   MRSA by PCR NEGATIVE NEGATIVE Final    Comment:        The GeneXpert MRSA Assay (FDA approved for  NASAL specimens only), is one component of a comprehensive MRSA colonization surveillance program. It is not intended to diagnose MRSA infection nor to guide or monitor treatment for MRSA infections.      Labs: BNP (last 3 results) No results for input(s): BNP in the last 8760 hours. Basic Metabolic Panel:  Recent Labs Lab 09/05/15 0825 09/10/15 0954 09/10/15 1648 09/11/15 0512  NA 131* 127* 136 135  K 3.0* 2.0* 2.5* 3.4*  CL 95* 89* 101 106  CO2 30 26 26 24   GLUCOSE 137* 117* 132* 95  BUN 2* 6 <5* <5*  CREATININE 0.47 0.82 0.58 0.40*  CALCIUM 9.1 8.6* 8.6* 8.0*  MG  --  0.8* 3.4* 1.7   Liver Function Tests:  Recent Labs Lab 09/05/15 0825 09/10/15 0954  AST 57* 49*  ALT 22 21  ALKPHOS 125* 105  BILITOT 0.8 0.8  PROT 7.1 6.8  ALBUMIN 3.3* 3.0*   No results for input(s): LIPASE, AMYLASE in the last 168 hours. No results for input(s): AMMONIA in the last 168 hours. CBC:  Recent Labs Lab 09/05/15 0825 09/10/15 0954  WBC 9.2 19.8*  NEUTROABS 5.8 17.1*  HGB 13.4 12.2  HCT 39.9 34.0*  MCV 110.6 Repeated and verified X2.* 102.7*  PLT 237.0 290   Cardiac Enzymes: No results for input(s): CKTOTAL, CKMB, CKMBINDEX, TROPONINI in the last 168 hours. BNP: Invalid input(s): POCBNP CBG: No results for input(s): GLUCAP in the last 168 hours. D-Dimer No results for input(s): DDIMER in the last 72 hours. Hgb A1c No results for input(s): HGBA1C in the last 72 hours. Lipid Profile No results for input(s): CHOL, HDL, LDLCALC, TRIG, CHOLHDL, LDLDIRECT in the last 72 hours. Thyroid function studies No results for input(s): TSH, T4TOTAL, T3FREE, THYROIDAB in the last 72 hours.  Invalid input(s): FREET3 Anemia work up No results for input(s): VITAMINB12, FOLATE, FERRITIN, TIBC, IRON, RETICCTPCT in the last 72 hours. Urinalysis  Component Value Date/Time   COLORURINE YELLOW 09/10/2015 1059   APPEARANCEUR CLOUDY* 09/10/2015 1059   LABSPEC 1.005 09/10/2015 1059    PHURINE 6.0 09/10/2015 1059   GLUCOSEU NEGATIVE 09/10/2015 1059   HGBUR NEGATIVE 09/10/2015 1059   BILIRUBINUR NEGATIVE 09/10/2015 1059   KETONESUR NEGATIVE 09/10/2015 1059   PROTEINUR NEGATIVE 09/10/2015 1059   NITRITE POSITIVE* 09/10/2015 1059   LEUKOCYTESUR MODERATE* 09/10/2015 1059   Sepsis Labs Invalid input(s): PROCALCITONIN,  WBC,  LACTICIDVEN Microbiology Recent Results (from the past 240 hour(s))  MRSA PCR Screening     Status: None   Collection Time: 09/10/15  6:57 PM  Result Value Ref Range Status   MRSA by PCR NEGATIVE NEGATIVE Final    Comment:        The GeneXpert MRSA Assay (FDA approved for NASAL specimens only), is one component of a comprehensive MRSA colonization surveillance program. It is not intended to diagnose MRSA infection nor to guide or monitor treatment for MRSA infections.     Addendum: The patient develop worsening confusion as was not d/c this date as further work up is plan.  Time coordinating discharge: Over 30 minutes  SIGNED:   Charlynne Cousins, MD  [facility] 09/11/2015, 10:53 AM Pager   If 7PM-7AM, please contact night-coverage [email or web address] Password [email or web address]

## 2015-09-11 NOTE — Care Management Note (Signed)
Case Management Note  Patient Details  Name: Jaedon Barrera MRN: [id number] Date of Birth: 25-Sep-1960  Subjective/Objective: 55 y/o f admitted w/hypokalemia, fall. [id number].From [employer].ortho-no sx,nwb.PT-SNF. CSW notified.                   Action/Plan:d/c SNF.   Expected Discharge Date:   (UNKNOWN)               Expected Discharge Plan:  Skilled Nursing Facility  In-House Referral:  Clinical Social Work  Discharge planning Services  CM Consult  Post Acute Care Choice:    Choice offered to:     DME Arranged:    DME Agency:     HH Arranged:    [place] Agency:     Status of Service:  Completed, signed off  If discussed at [employer], dates discussed:    Additional Comments:  Dessa Phi, RN 09/11/2015, 11:34 AM

## 2015-09-11 NOTE — Progress Notes (Signed)
MD paged pt continue to be restless and attempting to get OOB. Pt noted with wheezin in bases and sat dropped 80-85% on RM air then took deep breaths and increased to 99 to 100%. SRP, RN

## 2015-09-11 NOTE — Progress Notes (Signed)
Patient with increased confusion, hallucinations, and agitation overnight.  Attempting to get OOB alone. States she is leaving to go across the street to her parent's house to smoke. Patient seeing people and things in the room that are not there.  Pt has also had frequent urination overnight. A one time dose of IV Ativan given to pt. Will continue to monitor patient.

## 2015-09-11 NOTE — Consult Note (Signed)
Reason for Consult:Left ankle fracture Referring Physician: Carlyle Lipa Taylin Mans is an 55 y.o. female.  HPI: 55 year old alcoholic in recovery with twisting injury to the left ankle  2 nights ago. Currently admitted to the hospital with withdrawal symptoms. Denies other musculoskeletal injury.   Past Medical History  Diagnosis Date  . Hernia of abdominal cavity   . Alcohol abuse   . Depression   . Chicken pox   . Colon polyps   . Hip fracture ([place]) right  . Neuropathy ([place])     History reviewed. No pertinent past surgical history.  Family History  Problem Relation Age of Onset  . Hypertension Mother   . Cancer Father 62    prostate    Social History:  reports that she has been smoking Cigarettes.  She has been smoking about 0.15 packs per day. She has never used smokeless tobacco. She reports that she drinks about 12.6 oz of alcohol per week. She reports that she does not use illicit drugs.  Allergies:  Allergies  Allergen Reactions  . Aspirin Swelling    unknown    Medications: I have reviewed the patient's current medications.  Results for orders placed or performed during the hospital encounter of 09/10/15 (from the past 48 hour(s))  Comprehensive metabolic panel     Status: Abnormal   Collection Time: 09/10/15  9:54 AM  Result Value Ref Range   Sodium 127 (L) 135 - 145 mmol/L   Potassium 2.0 (LL) 3.5 - 5.1 mmol/L    Comment: CRITICAL RESULT CALLED TO, READ BACK BY AND VERIFIED WITH: KESSLER,R. RN '@1044'  ON 7.18.17 BY MCCOY,N.    Chloride 89 (L) 101 - 111 mmol/L   CO2 26 22 - 32 mmol/L   Glucose, Bld 117 (H) 65 - 99 mg/dL   BUN 6 6 - 20 mg/dL   Creatinine, Ser 0.82 0.44 - 1.00 mg/dL   Calcium 8.6 (L) 8.9 - 10.3 mg/dL   Total Protein 6.8 6.5 - 8.1 g/dL   Albumin 3.0 (L) 3.5 - 5.0 g/dL   AST 49 (H) 15 - 41 U/L   ALT 21 14 - 54 U/L   Alkaline Phosphatase 105 38 - 126 U/L   Total Bilirubin 0.8 0.3 - 1.2 mg/dL   GFR calc non Af Amer >60 >60 mL/min    GFR calc Af Amer >60 >60 mL/min    Comment: (NOTE) The eGFR has been calculated using the CKD EPI equation. This calculation has not been validated in all clinical situations. eGFR's persistently <60 mL/min signify possible Chronic Kidney Disease.    Anion gap 12 5 - 15  CBC with Differential     Status: Abnormal   Collection Time: 09/10/15  9:54 AM  Result Value Ref Range   WBC 19.8 (H) 4.0 - 10.5 K/uL   RBC 3.31 (L) 3.87 - 5.11 MIL/uL   Hemoglobin 12.2 12.0 - 15.0 g/dL   HCT 34.0 (L) 36.0 - 46.0 %   MCV 102.7 (H) 78.0 - 100.0 fL   MCH 36.9 (H) 26.0 - 34.0 pg   MCHC 35.9 30.0 - 36.0 g/dL   RDW 13.0 11.5 - 15.5 %   Platelets 290 150 - 400 K/uL   Neutrophils Relative % 86 %   Neutro Abs 17.1 (H) 1.7 - 7.7 K/uL   Lymphocytes Relative 6 %   Lymphs Abs 1.3 0.7 - 4.0 K/uL   Monocytes Relative 8 %   Monocytes Absolute 1.5 (H) 0.1 - 1.0 K/uL  Eosinophils Relative 0 %   Eosinophils Absolute 0.0 0.0 - 0.7 K/uL   Basophils Relative 0 %   Basophils Absolute 0.0 0.0 - 0.1 K/uL  Magnesium     Status: Abnormal   Collection Time: 09/10/15  9:54 AM  Result Value Ref Range   Magnesium 0.8 (LL) 1.7 - 2.4 mg/dL    Comment: CRITICAL RESULT CALLED TO, READ BACK BY AND VERIFIED WITH: KESSLER,R. RN '@1114'  ON 7.18.17 BY MCCOY,N   Urinalysis, Routine w reflex microscopic     Status: Abnormal   Collection Time: 09/10/15 10:59 AM  Result Value Ref Range   Color, Urine YELLOW YELLOW   APPearance CLOUDY (A) CLEAR   Specific Gravity, Urine 1.005 1.005 - 1.030   pH 6.0 5.0 - 8.0   Glucose, UA NEGATIVE NEGATIVE mg/dL   Hgb urine dipstick NEGATIVE NEGATIVE   Bilirubin Urine NEGATIVE NEGATIVE   Ketones, ur NEGATIVE NEGATIVE mg/dL   Protein, ur NEGATIVE NEGATIVE mg/dL   Nitrite POSITIVE (A) NEGATIVE   Leukocytes, UA MODERATE (A) NEGATIVE  Urine microscopic-add on     Status: Abnormal   Collection Time: 09/10/15 10:59 AM  Result Value Ref Range   Squamous Epithelial / LPF 0-5 (A) NONE SEEN   WBC,  UA 0-5 0 - 5 WBC/hpf   RBC / HPF 0-5 0 - 5 RBC/hpf   Bacteria, UA MANY (A) NONE SEEN  Urine rapid drug screen (hosp performed)     Status: Abnormal   Collection Time: 09/10/15 11:02 AM  Result Value Ref Range   Opiates NONE DETECTED NONE DETECTED   Cocaine NONE DETECTED NONE DETECTED   Benzodiazepines POSITIVE (A) NONE DETECTED   Amphetamines NONE DETECTED NONE DETECTED   Tetrahydrocannabinol NONE DETECTED NONE DETECTED   Barbiturates NONE DETECTED NONE DETECTED    Comment:        DRUG SCREEN FOR MEDICAL PURPOSES ONLY.  IF CONFIRMATION IS NEEDED FOR ANY PURPOSE, NOTIFY LAB WITHIN 5 DAYS.        LOWEST DETECTABLE LIMITS FOR URINE DRUG SCREEN Drug Class       Cutoff (ng/mL) Amphetamine      1000 Barbiturate      200 Benzodiazepine   256 Tricyclics       389 Opiates          300 Cocaine          300 THC              50   Ethanol     Status: None   Collection Time: 09/10/15 11:48 AM  Result Value Ref Range   Alcohol, Ethyl (B) <5 <5 mg/dL    Comment:        LOWEST DETECTABLE LIMIT FOR SERUM ALCOHOL IS 5 mg/dL FOR MEDICAL PURPOSES ONLY   Basic metabolic panel     Status: Abnormal   Collection Time: 09/10/15  4:48 PM  Result Value Ref Range   Sodium 136 135 - 145 mmol/L    Comment: DELTA CHECK NOTED   Potassium 2.5 (LL) 3.5 - 5.1 mmol/L    Comment: DELTA CHECK NOTED CRITICAL RESULT CALLED TO, READ BACK BY AND VERIFIED WITH: HUFF,J AT 1738 ON 09/10/15 BY MOSLEY,J    Chloride 101 101 - 111 mmol/L   CO2 26 22 - 32 mmol/L   Glucose, Bld 132 (H) 65 - 99 mg/dL   BUN <5 (L) 6 - 20 mg/dL   Creatinine, Ser 0.58 0.44 - 1.00 mg/dL   Calcium 8.6 (L) 8.9 -  10.3 mg/dL   GFR calc non Af Amer >60 >60 mL/min   GFR calc Af Amer >60 >60 mL/min    Comment: (NOTE) The eGFR has been calculated using the CKD EPI equation. This calculation has not been validated in all clinical situations. eGFR's persistently <60 mL/min signify possible Chronic Kidney Disease.    Anion gap 9 5 - 15   Magnesium     Status: Abnormal   Collection Time: 09/10/15  4:48 PM  Result Value Ref Range   Magnesium 3.4 (H) 1.7 - 2.4 mg/dL  MRSA PCR Screening     Status: None   Collection Time: 09/10/15  6:57 PM  Result Value Ref Range   MRSA by PCR NEGATIVE NEGATIVE    Comment:        The GeneXpert MRSA Assay (FDA approved for NASAL specimens only), is one component of a comprehensive MRSA colonization surveillance program. It is not intended to diagnose MRSA infection nor to guide or monitor treatment for MRSA infections.   Urine rapid drug screen (hosp performed)     Status: Abnormal   Collection Time: 09/11/15 12:31 AM  Result Value Ref Range   Opiates NONE DETECTED NONE DETECTED   Cocaine NONE DETECTED NONE DETECTED   Benzodiazepines POSITIVE (A) NONE DETECTED   Amphetamines NONE DETECTED NONE DETECTED   Tetrahydrocannabinol NONE DETECTED NONE DETECTED   Barbiturates NONE DETECTED NONE DETECTED    Comment:        DRUG SCREEN FOR MEDICAL PURPOSES ONLY.  IF CONFIRMATION IS NEEDED FOR ANY PURPOSE, NOTIFY LAB WITHIN 5 DAYS.        LOWEST DETECTABLE LIMITS FOR URINE DRUG SCREEN Drug Class       Cutoff (ng/mL) Amphetamine      1000 Barbiturate      200 Benzodiazepine   759 Tricyclics       163 Opiates          300 Cocaine          300 THC              50   Strep pneumoniae urinary antigen     Status: None   Collection Time: 09/11/15 12:31 AM  Result Value Ref Range   Strep Pneumo Urinary Antigen NEGATIVE NEGATIVE    Comment:        Infection due to S. pneumoniae cannot be absolutely ruled out since the antigen present may be below the detection limit of the test. Performed at [facility] metabolic panel     Status: Abnormal   Collection Time: 09/11/15  5:12 AM  Result Value Ref Range   Sodium 135 135 - 145 mmol/L   Potassium 3.4 (L) 3.5 - 5.1 mmol/L    Comment: RESULT REPEATED AND VERIFIED DELTA CHECK NOTED NO VISIBLE HEMOLYSIS    Chloride 106  101 - 111 mmol/L   CO2 24 22 - 32 mmol/L   Glucose, Bld 95 65 - 99 mg/dL   BUN <5 (L) 6 - 20 mg/dL   Creatinine, Ser 0.40 (L) 0.44 - 1.00 mg/dL   Calcium 8.0 (L) 8.9 - 10.3 mg/dL   GFR calc non Af Amer >60 >60 mL/min   GFR calc Af Amer >60 >60 mL/min    Comment: (NOTE) The eGFR has been calculated using the CKD EPI equation. This calculation has not been validated in all clinical situations. eGFR's persistently <60 mL/min signify possible Chronic Kidney Disease.    Anion gap 5 5 -  15  Magnesium     Status: None   Collection Time: 09/11/15  5:12 AM  Result Value Ref Range   Magnesium 1.7 1.7 - 2.4 mg/dL    Dg Chest 2 View  09/10/2015  CLINICAL DATA:  Cough for 1 day EXAM: CHEST  2 VIEW COMPARISON:  August 24, 2015 FINDINGS: There is mild generalized interstitial thickening without frank edema or consolidation. Heart size and pulmonary vascularity are normal. No adenopathy. There is a focal hiatal hernia. No bone lesions. IMPRESSION: Focal hiatal hernia. Interstitium shows mild generalized thickening without frank edema or consolidation. This appearance is stable compared to recent prior study. Electronically Signed   By: Lowella Grip III M.D.   On: 09/10/2015 16:01   Dg Ankle Complete Left  09/10/2015  CLINICAL DATA:  54 year old female fell last night with left ankle injury. Anterior lateral pain. Swelling. Initial encounter. EXAM: LEFT ANKLE COMPLETE - 3+ VIEW COMPARISON:  None. FINDINGS: Oblique slightly displaced fracture left lateral malleolus with adjacent soft tissue swelling. No other fracture noted. Ankle mortise appears intact. Plantar spur. IMPRESSION: Oblique slightly displaced fracture left lateral malleolus with adjacent soft tissue swelling. Electronically Signed   By: Genia Del M.D.   On: 09/10/2015 10:09   Ct Head Wo Contrast  09/10/2015  CLINICAL DATA:  Fall EXAM: CT HEAD WITHOUT CONTRAST TECHNIQUE: Contiguous axial images were obtained from the base of the skull  through the vertex without intravenous contrast. COMPARISON:  None. FINDINGS: No skull fracture is noted. Paranasal sinuses and mastoid air cells are unremarkable. No intracranial hemorrhage, mass effect or midline shift. Mild cerebral atrophy. No acute cortical infarction. No mass lesion is noted on this unenhanced scan. No hydrocephalus. No intra or extra-axial fluid collection. IMPRESSION: No acute intracranial abnormality.  Mild cerebral atrophy. Electronically Signed   By: Lahoma Crocker M.D.   On: 09/10/2015 12:51    Review of Systems  Unable to perform ROS: mental acuity   Blood pressure 118/43, pulse 88, temperature 97.7 F (36.5 C), temperature source Oral, resp. rate 16, height '5\' 1"'  (1.549 m), weight 48.081 kg (106 lb), SpO2 99 %. Physical Exam  Constitutional: She appears well-developed and well-nourished.  HENT:  Head: Normocephalic.  Eyes: EOM are normal.  Cardiovascular: Intact distal pulses.   Respiratory: Effort normal.  Musculoskeletal:  Left ankle splinted.  Toes wiggle up and down and are grossly neurovascularly  Neurological:  Confused and disoriented  Skin: Skin is warm and dry.    Assessment/Plan: Left nondisplaced distal fibula fracture This should do well with nonoperative management with close follow-up.  She will remain nonweightbearing on the left lower extremity with elevation while in bed.  She should keep her splint clean dry and intact until follow-up.  Follow-up with me in 10-14 days for repeat x-ray once she is out of the hospital.  Nita Sells 09/11/2015, 8:17 AM

## 2015-09-11 NOTE — Progress Notes (Signed)
Worked with patient all day from 0750 to current time. Patient appears confused most of the time and with scattered thinking. She thinks she is home, then at church. She is talking to people at church, then [facility], the other places and events in her pass life. She is talking to people who are not present. Then she will clear for a moment, Patient is difficult to reorient. She is impulsive she jumps up constantly after being told to remain in bed . Pt is nonweightbearing on left foot per Ortho MD. Physical therapy was consulted and has spoke with family concerning patient care. Social worker has also spoke to patient and family about placement and patient care. Family is anxious; all disciplines has informed patient and family about the plan of care. Pt has had medication a total of Haldol 4 mg for anxiety per MD order and has been moderately effective, however the patient family is at the bedside and will stay with the patient. Safety sitters were discontinued earlier in the day. Will continue to monitor and bed alarm is currently on. SRP, RN

## 2015-09-11 NOTE — Clinical Social Work Placement (Signed)
CSW received call from PT, Kathy [name] that they are recommending SNF for patient at discharge before returning to [facility]. CSW spoke with patient's daughter, Kathy [name] (cell#: [phone]) to confirm that they are agreeable with plan for SNF - patient is currently confused per PT - thinks her yellow sock is corn on her foot. CSW sent information out to [facility] and will follow-up with bed offers, also awaiting PASRR. Patient has a Air cabin crew as she was trying to get out of bed - RN, Sophia & Dr. Venetia Constable made aware that patient will need to be without a sitter for 24 hours before she could be placed at SNF.   Raynaldo Opitz, [facility] Clinical Social Worker cell #: [phone]   CLINICAL SOCIAL WORK PLACEMENT  NOTE  Date:  09/11/2015  Patient Details  Name: Kathy [name] MRN: [id number] Date of Birth: 1961-02-04  Clinical Social Work is seeking post-discharge placement for this patient at the [place] level of care (*CSW will initial, date and re-position this form in  chart as items are completed):  Yes   Patient/family provided with [facility] Work Department's list of facilities offering this level of care within the geographic area requested by the patient (or if unable, by the patient's family).  Yes   Patient/family informed of their freedom to choose among providers that offer the needed level of care, that participate in Medicare, Medicaid or managed care program needed by the patient, have an available bed and are willing to accept the patient.  Yes   Patient/family informed of [facility]'s ownership interest in [facility] and [facility], as well as of the fact that they are under no obligation to receive care at these facilities.  PASRR submitted to [facility] on 09/11/15     PASRR number received on       Existing PASRR number confirmed on       FL2 transmitted to all facilities in geographic  area requested by pt/family on 09/11/15     FL2 transmitted to all facilities within larger geographic area on       Patient informed that his/her managed care company has contracts with or will negotiate with certain facilities, including the following:            Patient/family informed of bed offers received.  Patient chooses bed at       Physician recommends and patient chooses bed at      Patient to be transferred to   on  .  Patient to be transferred to facility by       Patient family notified on   of transfer.  Name of family member notified:        PHYSICIAN       Additional Comment:    _______________________________________________ Standley Brooking, LCSW 09/11/2015, 11:58 AM

## 2015-09-11 NOTE — Evaluation (Signed)
Physical Therapy Evaluation Patient Details Name: Kathy [name] MRN: [id number] DOB: 04-08-1960 Today's Date: 09/11/2015   History of Present Illness  the  patient is  55 yo admitted after fall  at [place] for ETOH. Found to have a distal fibula fracture on the Left/NWB.  Clinical Impression  The patient  demonstrates decreased safety , decreased ability to mobilize with a RW due to balance and difficulty with NWB. The patient is very confused and impulsive. Crutches are not an appropriate device at this time. Most likely whe will need to use a WC until MS clears and she can follow commands and  Maintain NWB. Pt admitted with above diagnosis. Pt currently with functional limitations due to the deficits listed below (see PT Problem List). Pt will benefit from skilled PT to increase their independence and safety with mobility to allow discharge to the venue listed below.       Follow Up Recommendations SNF;Supervision/Assistance - 24 hour    Equipment Recommendations  Wheelchair (measurements PT)    Recommendations for Other Services       Precautions / Restrictions Precautions Precautions: Fall Precaution Comments: confused Required Braces or Orthoses: Other Brace/Splint Other Brace/Splint: short leg cast on Lt Restrictions Weight Bearing Restrictions: Yes LLE Weight Bearing: Non weight bearing      Mobility  Bed Mobility Overal bed mobility: Needs Assistance Bed Mobility: Supine to Sit     Supine to sit: Supervision     General bed mobility comments: for safety  Transfers Overall transfer level: Needs assistance Equipment used: Rolling walker (2 wheeled) Transfers: Sit to/from Stand Sit to Stand: Mod assist         General transfer comment: cues for NWB, cues for hand placement and safety  Ambulation/Gait Ambulation/Gait assistance: Mod assist;+2 safety/equipment Ambulation Distance (Feet): 8 Feet (x 2)   Gait Pattern/deviations: Step-to  pattern     General Gait Details: constant cues for NWB, steady assist upon standing and during ambulation due to imbalance. At times, is not pushing down into the RW. Limited ambulation due to poor balance and risk for NWB  Stairs            Wheelchair Mobility    Modified Rankin (Stroke Patients Only)       Balance                                             Pertinent Vitals/Pain Pain Assessment: Faces Faces Pain Scale: Hurts little more Pain Location: L ankle when  attempts weight    Home Living                   Additional Comments: [facility]    Prior Function Level of Independence: Independent               Hand Dominance        Extremity/Trunk Assessment   Upper Extremity Assessment: Overall WFL for tasks assessed           Lower Extremity Assessment: LLE deficits/detail   LLE Deficits / Details: in [place], picks up the leg  Cervical / Trunk Assessment: Normal  Communication   Communication: Expressive difficulties (words are confused, misnaming objects. Stated" Why is that corn on my foot" refering to the yellow gripper sock.)  Cognition Arousal/Alertness: Awake/alert Behavior During Therapy: Restless;Impulsive Overall Cognitive Status: Impaired/Different from baseline Area of  Impairment: Orientation;Attention;Memory;Following commands;Safety/judgement;Awareness;Problem solving Orientation Level: Place;Time;Situation Current Attention Level: Selective;Alternating Memory: Decreased recall of precautions;Decreased short-term memory Following Commands: Follows one step commands inconsistently Safety/Judgement: Decreased awareness of safety;Decreased awareness of deficits   Problem Solving: Difficulty sequencing;Requires verbal cues;Requires tactile cues General Comments: constant cues to remain NWB on the left foot. Constant safety cues for mobility due to impusivity and l;ack of insight as to her injury.     General Comments      Exercises        Assessment/Plan    PT Assessment Patient needs continued PT services  PT Diagnosis Difficulty walking;Generalized weakness;Acute pain;Altered mental status   PT Problem List Decreased strength;Decreased activity tolerance;Decreased balance;Decreased cognition;Decreased knowledge of precautions;Decreased safety awareness;Decreased knowledge of use of DME  PT Treatment Interventions DME instruction;Gait training;Functional mobility training;Therapeutic activities;Therapeutic exercise;Patient/family education;Wheelchair mobility training   PT Goals (Current goals can be found in the Care Plan section) Acute Rehab PT Goals Patient Stated Goal: patient unable to state PT Goal Formulation: Patient unable to participate in goal setting Time For Goal Achievement: 09/25/15 Potential to Achieve Goals: Fair    Frequency Min 3X/week   Barriers to discharge Decreased caregiver support      Co-evaluation               End of Session Equipment Utilized During Treatment: Gait belt Activity Tolerance: Patient limited by fatigue Patient left: in chair;with call bell/phone within reach;with nursing/sitter in room Nurse Communication: Mobility status    Functional Assessment Tool Used: clinical judgement Functional Limitation: Mobility: Walking and moving around Mobility: Walking and Moving Around Current Status [phone]): At least 40 percent but less than 60 percent impaired, limited or restricted Mobility: Walking and Moving Around Goal Status [phone]): At least 1 percent but less than 20 percent impaired, limited or restricted    Time: [id number] PT Time Calculation (min) (ACUTE ONLY): 10 min   Charges:   PT Evaluation $PT Eval Low Complexity: 1 Procedure     PT G Codes:   PT G-Codes **NOT FOR INPATIENT CLASS** Functional Assessment Tool Used: clinical judgement Functional Limitation: Mobility: Walking and moving around Mobility: Walking  and Moving Around Current Status [id number]): At least 40 percent but less than 60 percent impaired, limited or restricted Mobility: Walking and Moving Around Goal Status [phone]): At least 1 percent but less than 20 percent impaired, limited or restricted    Claretha Cooper 09/11/2015, 1:25 PM  Tresa Endo PT [phone]

## 2015-09-12 DIAGNOSIS — F05 Delirium due to known physiological condition: Secondary | ICD-10-CM

## 2015-09-12 LAB — BASIC METABOLIC PANEL
Anion gap: 8 (ref 5–15)
BUN: <5 mg/dL — ABNORMAL LOW (ref 6–20)
CO2: 24 mmol/L (ref 22–32)
Calcium: 8.8 mg/dL — ABNORMAL LOW (ref 8.9–10.3)
Chloride: 100 mmol/L — ABNORMAL LOW (ref 101–111)
Creatinine, Ser: 0.38 mg/dL — ABNORMAL LOW (ref 0.44–1.00)
GFR calc Af Amer: >60 mL/min (ref >60)
GFR calc non Af Amer: >60 mL/min (ref >60)
Glucose, Bld: 98 mg/dL (ref 65–99)
Potassium: 4.3 mmol/L (ref 3.5–5.1)
Sodium: 132 mmol/L — ABNORMAL LOW (ref 135–145)

## 2015-09-12 LAB — CBC
HCT: 34 % — ABNORMAL LOW (ref 36.0–46.0)
Hemoglobin: 12 g/dL (ref 12.0–15.0)
MCH: 37.2 pg — ABNORMAL HIGH (ref 26.0–34.0)
MCHC: 35.3 g/dL (ref 30.0–36.0)
MCV: 105.3 fL — ABNORMAL HIGH (ref 78.0–100.0)
Platelets: 371 10*3/uL (ref 150–400)
RBC: 3.23 MIL/uL — ABNORMAL LOW (ref 3.87–5.11)
RDW: 13.2 % (ref 11.5–15.5)
WBC: 19.2 10*3/uL — ABNORMAL HIGH (ref 4.0–10.5)

## 2015-09-12 LAB — HEPATITIS PANEL, ACUTE
HCV Ab: <0.1 s/co ratio (ref 0.0–0.9)
Hep A IgM: NEGATIVE
Hep B C IgM: NEGATIVE
Hepatitis B Surface Ag: NEGATIVE

## 2015-09-12 LAB — LEGIONELLA PNEUMOPHILA SEROGP 1 UR AG: L. pneumophila Serogp 1 Ur Ag: NEGATIVE

## 2015-09-12 LAB — GAMMA GT: GGT: 156 U/L — ABNORMAL HIGH (ref 7–50)

## 2015-09-12 MED ORDER — CHLORDIAZEPOXIDE HCL 5 MG PO CAPS
25.0000 mg | ORAL_CAPSULE | Freq: Two times a day (BID) | ORAL | Status: DC
  Administered 2015-09-12 – 2015-09-14 (×5): 25 mg via ORAL
  Filled 2015-09-12 (×5): qty 2

## 2015-09-12 MED ORDER — NICOTINE 14 MG/24HR TD PT24: 14.0000 mg | MEDICATED_PATCH | Freq: Every day | TRANSDERMAL | Status: DC

## 2015-09-12 NOTE — Plan of Care (Signed)
Problem: Pain Managment: Goal: General experience of comfort will improve Outcome: Completed/Met Date Met:  09/12/15 Patient denies pain

## 2015-09-12 NOTE — Plan of Care (Signed)
Problem: Safety: Goal: Ability to remain free from injury will improve Outcome: Progressing Family is at bedside, bed alarm is activated on mid level sensitive to ensure continued safety

## 2015-09-12 NOTE — Progress Notes (Signed)
[facility] PROGRESS NOTE    Progress Note  Kathy [name]  [id number] DOB: [birth year] DOA: 09/10/2015 PCP: Crecencio Mc, MD     Brief Narrative:   Kathy [name] is an 55 y.o. female with past medical history of alcohol abuse and peripheral alcoholic neuropathy, history of bilateral hip fracture that did not require surgery came to the ED with with a left ankle pain after fall.  Assessment/Plan:   New acute confusional state versus alcohol withdrawal: CT scan of the head shows no acute findings, as per nursing staff and patient's family the patient's mentation has been fluctuating throughout the day, this seems most likely to be acute confusional state. I'll go ahead and restart low dose Librium, 12-lead EKG was checked that showed no widening of the QTC, will continue Haldol as needed Will get recreational therapy, I have had a long discussion with the family more than 30 minutes with the patient present that the need to be at bedside and so the patient can get familiarize with her surroundings.   Hypokalemia Resolved.  Hyponatremia Patient is slightly confused but is able to tolerate her diet, unlikely that her confusion is due to her mild hyponatremia attributed  Leucocytosis:/SIRS (systemic inflammatory response syndrome) (HCC) This probably due to her closed ankle fracture Closed left ankle fracture Follow-up with with her as an outpatient.      DVT prophylaxis: lenox Family Communication:mother Disposition Plan/Barrier to D/C: once confusion resolved. Code Status:     Code Status Orders        Start     Ordered   09/10/15 1705  Full code   Continuous     09/10/15 1704    Code Status History    Date Active Date Inactive Code Status Order ID Comments User Context   07/12/2014  4:12 PM 07/16/2014  3:47 PM Full Code [id number]  Bettey Costa, MD Inpatient        IV Access:    Peripheral IV   Procedures and diagnostic studies:   Dg  Chest 2 View  09/10/2015  CLINICAL DATA:  Cough for 1 day EXAM: CHEST  2 VIEW COMPARISON:  August 24, 2015 FINDINGS: There is mild generalized interstitial thickening without frank edema or consolidation. Heart size and pulmonary vascularity are normal. No adenopathy. There is a focal hiatal hernia. No bone lesions. IMPRESSION: Focal hiatal hernia. Interstitium shows mild generalized thickening without frank edema or consolidation. This appearance is stable compared to recent prior study. Electronically Signed   By: Lowella Grip III M.D.   On: 09/10/2015 16:01   Ct Head Wo Contrast  09/10/2015  CLINICAL DATA:  Fall EXAM: CT HEAD WITHOUT CONTRAST TECHNIQUE: Contiguous axial images were obtained from the base of the skull through the vertex without intravenous contrast. COMPARISON:  None. FINDINGS: No skull fracture is noted. Paranasal sinuses and mastoid air cells are unremarkable. No intracranial hemorrhage, mass effect or midline shift. Mild cerebral atrophy. No acute cortical infarction. No mass lesion is noted on this unenhanced scan. No hydrocephalus. No intra or extra-axial fluid collection. IMPRESSION: No acute intracranial abnormality.  Mild cerebral atrophy. Electronically Signed   By: Lahoma Crocker M.D.   On: 09/10/2015 12:51     Medical Consultants:    None.  Anti-Infectives:   none  Subjective:    Kathy [name] patient is still confused and she knows where she is at.rule out the day she gets more confused than usual.  Objective:    Filed Vitals:  09/11/15 1313 09/11/15 2121 09/12/15 0510 09/12/15 0900  BP: 137/85 151/80 145/90 150/99  Pulse: 65 122 109 117  Temp: 97.4 F (36.3 C) 97.9 F (36.6 C) 99.7 F (37.6 C) 97.9 F (36.6 C)  TempSrc: Oral Oral Oral Oral  Resp: 16 18 14 17   Height:      Weight:      SpO2: 100% 100% 99% 98%    Intake/Output Summary (Last 24 hours) at 09/12/15 1112 Last data filed at 09/12/15 0900  Gross per 24 hour  Intake    530 ml    Output   2000 ml  Net  -1470 ml   Filed Weights   09/10/15 1410  Weight: 48.081 kg (106 lb)    Exam: General exam: In no acute distress. Respiratory system: Good air movement and clear to auscultation. Cardiovascular system: S1 & S2 heard, RRR. No JVD, murmurs, rubs, gallops or clicks.  Gastrointestinal system: Abdomen is nondistended, soft and nontender.  Central nervous system: Alert and oriented x2. No focal neurological deficits. Extremities: No pedal edema. Skin: No rashes, lesions or ulcers Psychiatry: She has an appropriate judgment and affect both poor insight on her condition.   Data Reviewed:    Labs: Basic Metabolic Panel:  Recent Labs Lab 09/10/15 0954 09/10/15 1648 09/11/15 0512 09/12/15 0500  NA 127* 136 135 132*  K 2.0* 2.5* 3.4* 4.3  CL 89* 101 106 100*  CO2 26 26 24 24   GLUCOSE 117* 132* 95 98  BUN 6 <5* <5* <5*  CREATININE 0.82 0.58 0.40* 0.38*  CALCIUM 8.6* 8.6* 8.0* 8.8*  MG 0.8* 3.4* 1.7  --    GFR Estimated Creatinine Clearance: 61.4 mL/min (by C-G formula based on Cr of 0.38). Liver Function Tests:  Recent Labs Lab 09/10/15 0954  AST 49*  ALT 21  ALKPHOS 105  BILITOT 0.8  PROT 6.8  ALBUMIN 3.0*   No results for input(s): LIPASE, AMYLASE in the last 168 hours. No results for input(s): AMMONIA in the last 168 hours. Coagulation profile No results for input(s): INR, PROTIME in the last 168 hours.  CBC:  Recent Labs Lab 09/10/15 0954 09/12/15 0914  WBC 19.8* 19.2*  NEUTROABS 17.1*  --   HGB 12.2 12.0  HCT 34.0* 34.0*  MCV 102.7* 105.3*  PLT 290 371   Cardiac Enzymes: No results for input(s): CKTOTAL, CKMB, CKMBINDEX, TROPONINI in the last 168 hours. BNP (last 3 results) No results for input(s): PROBNP in the last 8760 hours. CBG: No results for input(s): GLUCAP in the last 168 hours. D-Dimer: No results for input(s): DDIMER in the last 72 hours. Hgb A1c: No results for input(s): HGBA1C in the last 72 hours. Lipid  Profile: No results for input(s): CHOL, HDL, LDLCALC, TRIG, CHOLHDL, LDLDIRECT in the last 72 hours. Thyroid function studies: No results for input(s): TSH, T4TOTAL, T3FREE, THYROIDAB in the last 72 hours.  Invalid input(s): FREET3 Anemia work up: No results for input(s): VITAMINB12, FOLATE, FERRITIN, TIBC, IRON, RETICCTPCT in the last 72 hours. Sepsis Labs:  Recent Labs Lab 09/10/15 0954 09/12/15 0914  WBC 19.8* 19.2*   Microbiology Recent Results (from the past 240 hour(s))  Urine culture     Status: Abnormal   Collection Time: 09/10/15 10:59 AM  Result Value Ref Range Status   Specimen Description URINE, CATHETERIZED  Final   Special Requests NONE  Final   Culture MULTIPLE SPECIES PRESENT, SUGGEST RECOLLECTION (A)  Final   Report Status 09/11/2015 FINAL  Final  Culture, blood (  routine x 2)     Status: None (Preliminary result)   Collection Time: 09/10/15  4:48 PM  Result Value Ref Range Status   Specimen Description BLOOD RIGHT ARM  Final   Special Requests BOTTLES DRAWN AEROBIC AND ANAEROBIC  5CC  Final   Culture   Final    NO GROWTH < 24 HOURS Performed at [facility]    Report Status PENDING  Incomplete  MRSA PCR Screening     Status: None   Collection Time: 09/10/15  6:57 PM  Result Value Ref Range Status   MRSA by PCR NEGATIVE NEGATIVE Final    Comment:        The GeneXpert MRSA Assay (FDA approved for NASAL specimens only), is one component of a comprehensive MRSA colonization surveillance program. It is not intended to diagnose MRSA infection nor to guide or monitor treatment for MRSA infections.      Medications:   . [START ON 09/15/2015] cloNIDine  0.1 mg Transdermal Weekly  . enoxaparin (LOVENOX) injection  40 mg Subcutaneous Q24H  . famotidine (PEPCID) IV  20 mg Intravenous Q12H  . gabapentin  400 mg Oral TID  . guaiFENesin  600 mg Oral BID  . mirtazapine  15 mg Oral QHS  . multivitamin with minerals  1 tablet Oral Daily  .  propranolol  10 mg Oral BID  . QUEtiapine  50 mg Oral QHS  . sodium chloride  1,000 mL Intravenous Once  . sodium chloride flush  3 mL Intravenous Q12H  . thiamine  100 mg Intramuscular Once  . thiamine  100 mg Oral Daily   Continuous Infusions:   Time spent: 25 min   LOS: 2 days   Kathy [name]  [facility] Pager [phone]  *Please refer to [email or web address], password [email or web address] to get updated schedule on who will round on this patient, as hospitalists switch teams weekly. If 7PM-7AM, please contact night-coverage at [email or web address], password [email or web address] for any overnight needs.  09/12/2015, 11:12 AM

## 2015-09-12 NOTE — Care Management Note (Signed)
Case Management Note  Patient Details  Name: Kathy [name] MRN: [id number] Date of Birth: [birth year]  Subjective/Objective: Noted mental status changes, w-19.2,na-132,p-117-122, 1:1. D/c plan SNF-CSW following.                  Action/Plan:d/c plan SNF.   Expected Discharge Date:   (UNKNOWN)               Expected Discharge Plan:  Skilled Nursing Facility  In-House Referral:  Clinical Social Work  Discharge planning Services  CM Consult  Post Acute Care Choice:    Choice offered to:     DME Arranged:    DME Agency:     HH Arranged:    [place] Agency:     Status of Service:  In process, will continue to follow  If discussed at Long Length of Stay Meetings, dates discussed:    Additional Comments:  Dessa Phi, RN 09/12/2015, 10:58 AM

## 2015-09-12 NOTE — Plan of Care (Signed)
Problem: Education: Goal: Knowledge of [facility] Education information/materials will improve Outcome: Completed/Met Date Met:  09/12/15 Family updated on pt diagnosis and treatment/pregression to date

## 2015-09-13 ENCOUNTER — Inpatient Hospital Stay (HOSPITAL_COMMUNITY): Payer: BLUE CROSS/BLUE SHIELD

## 2015-09-13 LAB — BASIC METABOLIC PANEL
Anion gap: 10 (ref 5–15)
BUN: 5 mg/dL — ABNORMAL LOW (ref 6–20)
CO2: 23 mmol/L (ref 22–32)
Calcium: 9 mg/dL (ref 8.9–10.3)
Chloride: 95 mmol/L — ABNORMAL LOW (ref 101–111)
Creatinine, Ser: 0.49 mg/dL (ref 0.44–1.00)
GFR calc Af Amer: >60 mL/min (ref >60)
GFR calc non Af Amer: >60 mL/min (ref >60)
Glucose, Bld: 145 mg/dL — ABNORMAL HIGH (ref 65–99)
Potassium: 3.7 mmol/L (ref 3.5–5.1)
Sodium: 128 mmol/L — ABNORMAL LOW (ref 135–145)

## 2015-09-13 LAB — CBC
HCT: 35.4 % — ABNORMAL LOW (ref 36.0–46.0)
Hemoglobin: 12.5 g/dL (ref 12.0–15.0)
MCH: 37.1 pg — ABNORMAL HIGH (ref 26.0–34.0)
MCHC: 35.3 g/dL (ref 30.0–36.0)
MCV: 105 fL — ABNORMAL HIGH (ref 78.0–100.0)
Platelets: 418 10*3/uL — ABNORMAL HIGH (ref 150–400)
RBC: 3.37 MIL/uL — ABNORMAL LOW (ref 3.87–5.11)
RDW: 13.5 % (ref 11.5–15.5)
WBC: 13.3 10*3/uL — ABNORMAL HIGH (ref 4.0–10.5)

## 2015-09-13 MED ORDER — METHOCARBAMOL 500 MG PO TABS
500.0000 mg | ORAL_TABLET | Freq: Two times a day (BID) | ORAL | Status: DC | PRN
  Administered 2015-09-13 (×2): 500 mg via ORAL
  Filled 2015-09-13 (×2): qty 1

## 2015-09-13 MED ORDER — DEXTROSE 5 % IV SOLN
2.0000 g | INTRAVENOUS | Status: DC
  Administered 2015-09-13: 2 g via INTRAVENOUS
  Filled 2015-09-13: qty 2

## 2015-09-13 MED ORDER — ACETAMINOPHEN 325 MG PO TABS
650.0000 mg | ORAL_TABLET | Freq: Four times a day (QID) | ORAL | Status: DC | PRN
  Administered 2015-09-13 – 2015-09-14 (×2): 650 mg via ORAL
  Filled 2015-09-13 (×2): qty 2

## 2015-09-13 MED ORDER — TRAMADOL HCL 50 MG PO TABS
50.0000 mg | ORAL_TABLET | Freq: Four times a day (QID) | ORAL | Status: DC | PRN
  Administered 2015-09-13 – 2015-09-16 (×3): 50 mg via ORAL
  Filled 2015-09-13 (×3): qty 1

## 2015-09-13 MED ORDER — SODIUM CHLORIDE 0.9 % IV SOLN
INTRAVENOUS | Status: DC
  Administered 2015-09-13: 10:00:00 via INTRAVENOUS

## 2015-09-13 MED ORDER — FAMOTIDINE 20 MG PO TABS
40.0000 mg | ORAL_TABLET | Freq: Two times a day (BID) | ORAL | Status: DC
  Administered 2015-09-13 – 2015-09-16 (×7): 40 mg via ORAL
  Filled 2015-09-13 (×7): qty 2

## 2015-09-13 MED ORDER — PROPRANOLOL HCL 10 MG PO TABS
20.0000 mg | ORAL_TABLET | Freq: Two times a day (BID) | ORAL | Status: DC
  Administered 2015-09-13 – 2015-09-16 (×7): 20 mg via ORAL
  Filled 2015-09-13 (×7): qty 2

## 2015-09-13 NOTE — Progress Notes (Signed)
[facility] PROGRESS NOTE    Progress Note  Kathy [name]  [id number] DOB: November 06, 1960 DOA: 09/10/2015 PCP: Crecencio Mc, MD     Brief Narrative:   Kathy [name] is an 55 y.o. female with past medical history of alcohol abuse and peripheral alcoholic neuropathy, history of bilateral hip fracture that did not require surgery came to the ED with with a left ankle pain after fall.  Assessment/Plan:   New acute confusional state versus alcohol withdrawal: Due to the patient's tachycardia and hypertension with confusion she was started on benzodiazepine, which improved her mentation. Question of her acute confusional state was likely due to the withholding of the benzodiazepines. Level on admission was less than 5, for this she was at [employer] As per family member she is close to baseline.  Hyponatremia: Likely due to dehydration started on aggressive IV fluid hydration and recheck in the morning.  Hypokalemia: Resolved  Leucocytosis:/SIRS (systemic inflammatory response syndrome) (HCC) This probably due to her closed ankle fracture Closed left ankle fracture Follow-up with with her as an outpatient.      DVT prophylaxis: levenox Family Communication:mother Disposition Plan/Barrier to D/C: Hopefully in the morning. Code Status:     Code Status Orders        Start     Ordered   09/10/15 1705  Full code   Continuous     09/10/15 1704    Code Status History    Date Active Date Inactive Code Status Order ID Comments User Context   07/12/2014  4:12 PM 07/16/2014  3:47 PM Full Code [id number]  Bettey Costa, MD Inpatient        IV Access:    Peripheral IV   Procedures and diagnostic studies:   No results found.   Medical Consultants:    None.  Anti-Infectives:   none  Subjective:    Kathy [name] more awake today she continues to complain of her right leg being painful she relates is likely due to peripheral  neuropathy.  Objective:    Filed Vitals:   09/12/15 1100 09/12/15 1400 09/12/15 2202 09/13/15 0544  BP: 139/91 157/95 143/86 103/77  Pulse: 108 97 122 122  Temp:  98.3 F (36.8 C) 99.7 F (37.6 C) 98.9 F (37.2 C)  TempSrc:  Oral Oral Oral  Resp:  20 18 18   Height:      Weight:      SpO2:  98% 95% 92%    Intake/Output Summary (Last 24 hours) at 09/13/15 0808 Last data filed at 09/12/15 2018  Gross per 24 hour  Intake    600 ml  Output   1750 ml  Net  -1150 ml   Filed Weights   09/10/15 1410  Weight: 48.081 kg (106 lb)    Exam: General exam: In no acute distress. Respiratory system: Good air movement and clear to auscultation. Cardiovascular system: S1 & S2 heard, RRR.  Gastrointestinal system: Abdomen is nondistended, soft and nontender.  Central nervous system: Alert and oriented x2. No focal neurological deficits. Extremities: No pedal edema. Skin: No rashes, lesions or ulcers Psychiatry: She has an appropriate judgment and affect both poor insight on her condition.   Data Reviewed:    Labs: Basic Metabolic Panel:  Recent Labs Lab 09/10/15 0954 09/10/15 1648 09/11/15 0512 09/12/15 0500 09/13/15 0617  NA 127* 136 135 132* 128*  K 2.0* 2.5* 3.4* 4.3 3.7  CL 89* 101 106 100* 95*  CO2 26 26 24 24  23  GLUCOSE 117* 132* 95 98 145*  BUN 6 <5* <5* <5* 5*  CREATININE 0.82 0.58 0.40* 0.38* 0.49  CALCIUM 8.6* 8.6* 8.0* 8.8* 9.0  MG 0.8* 3.4* 1.7  --   --    GFR Estimated Creatinine Clearance: 61.4 mL/min (by C-G formula based on Cr of 0.49). Liver Function Tests:  Recent Labs Lab 09/10/15 0954  AST 49*  ALT 21  ALKPHOS 105  BILITOT 0.8  PROT 6.8  ALBUMIN 3.0*   No results for input(s): LIPASE, AMYLASE in the last 168 hours. No results for input(s): AMMONIA in the last 168 hours. Coagulation profile No results for input(s): INR, PROTIME in the last 168 hours.  CBC:  Recent Labs Lab 09/10/15 0954 09/12/15 0914 09/13/15 0617  WBC 19.8*  19.2* 13.3*  NEUTROABS 17.1*  --   --   HGB 12.2 12.0 12.5  HCT 34.0* 34.0* 35.4*  MCV 102.7* 105.3* 105.0*  PLT 290 371 418*   Cardiac Enzymes: No results for input(s): CKTOTAL, CKMB, CKMBINDEX, TROPONINI in the last 168 hours. BNP (last 3 results) No results for input(s): PROBNP in the last 8760 hours. CBG: No results for input(s): GLUCAP in the last 168 hours. D-Dimer: No results for input(s): DDIMER in the last 72 hours. Hgb A1c: No results for input(s): HGBA1C in the last 72 hours. Lipid Profile: No results for input(s): CHOL, HDL, LDLCALC, TRIG, CHOLHDL, LDLDIRECT in the last 72 hours. Thyroid function studies: No results for input(s): TSH, T4TOTAL, T3FREE, THYROIDAB in the last 72 hours.  Invalid input(s): FREET3 Anemia work up: No results for input(s): VITAMINB12, FOLATE, FERRITIN, TIBC, IRON, RETICCTPCT in the last 72 hours. Sepsis Labs:  Recent Labs Lab 09/10/15 0954 09/12/15 0914 09/13/15 0617  WBC 19.8* 19.2* 13.3*   Microbiology Recent Results (from the past 240 hour(s))  Urine culture     Status: Abnormal   Collection Time: 09/10/15 10:59 AM  Result Value Ref Range Status   Specimen Description URINE, CATHETERIZED  Final   Special Requests NONE  Final   Culture MULTIPLE SPECIES PRESENT, SUGGEST RECOLLECTION (A)  Final   Report Status 09/11/2015 FINAL  Final  Culture, blood (routine x 2)     Status: None (Preliminary result)   Collection Time: 09/10/15  4:48 PM  Result Value Ref Range Status   Specimen Description BLOOD RIGHT ARM  Final   Special Requests BOTTLES DRAWN AEROBIC AND ANAEROBIC  5CC  Final   Culture   Final    NO GROWTH 2 DAYS Performed at [facility]    Report Status PENDING  Incomplete  MRSA PCR Screening     Status: None   Collection Time: 09/10/15  6:57 PM  Result Value Ref Range Status   MRSA by PCR NEGATIVE NEGATIVE Final    Comment:        The GeneXpert MRSA Assay (FDA approved for NASAL specimens only), is one  component of a comprehensive MRSA colonization surveillance program. It is not intended to diagnose MRSA infection nor to guide or monitor treatment for MRSA infections.      Medications:   . chlordiazePOXIDE  25 mg Oral BID  . [START ON 09/15/2015] cloNIDine  0.1 mg Transdermal Weekly  . enoxaparin (LOVENOX) injection  40 mg Subcutaneous Q24H  . gabapentin  400 mg Oral TID  . guaiFENesin  600 mg Oral BID  . mirtazapine  15 mg Oral QHS  . multivitamin with minerals  1 tablet Oral Daily  . propranolol  10 mg  Oral BID  . QUEtiapine  50 mg Oral QHS  . sodium chloride  1,000 mL Intravenous Once  . sodium chloride flush  3 mL Intravenous Q12H  . thiamine  100 mg Intramuscular Once  . thiamine  100 mg Oral Daily   Continuous Infusions:   Time spent: 25 min   LOS: 3 days   Charlynne Cousins  [facility] Pager [phone]  *Please refer to [email or web address], password [email or web address] to get updated schedule on who will round on this patient, as hospitalists switch teams weekly. If 7PM-7AM, please contact night-coverage at [email or web address], password [email or web address] for any overnight needs.  09/13/2015, 8:08 AM

## 2015-09-13 NOTE — Clinical Social Work Placement (Signed)
CSW confirmed with Janett Billow at [facility] that they would be able to take patient over the weekend if ready.   Please call weekend CSW (ph#: [phone]) to facilitate discharge.      Raynaldo Opitz, [facility] Clinical Social Worker cell #: [phone]   CLINICAL SOCIAL WORK PLACEMENT  NOTE  Date:  09/13/2015  Patient Details  Name: Kathy [name] MRN: [id number] Date of Birth: [birth year]  Clinical Social Work is seeking post-discharge placement for this patient at the [place] level of care (*CSW will initial, date and re-position this form in  chart as items are completed):  Yes   Patient/family provided with [facility]'s list of facilities offering this level of care within the geographic area requested by the patient (or if unable, by the patient's family).  Yes   Patient/family informed of their freedom to choose among providers that offer the needed level of care, that participate in Medicare, Medicaid or managed care program needed by the patient, have an available bed and are willing to accept the patient.  Yes   Patient/family informed of [facility]'s ownership interest in [facility] and [facility], as well as of the fact that they are under no obligation to receive care at these facilities.  PASRR submitted to EDS on 09/11/15     PASRR number received on 09/13/15     Existing PASRR number confirmed on       FL2 transmitted to all facilities in geographic area requested by pt/family on 09/11/15     FL2 transmitted to all facilities within larger geographic area on       Patient informed that his/her managed care company has contracts with or will negotiate with certain facilities, including the following:        Yes   Patient/family informed of bed offers received.  Patient chooses bed at [facility]     Physician recommends and patient chooses bed at      Patient to be  transferred to [facility] on  .  Patient to be transferred to facility by       Patient family notified on   of transfer.  Name of family member notified:        PHYSICIAN       Additional Comment:    _______________________________________________ Standley Brooking, LCSW 09/13/2015, 12:12 PM

## 2015-09-13 NOTE — Progress Notes (Addendum)
Pharmacy Antibiotic Note  Kathy [name] is a 55 y.o. female admitted on 09/10/2015 with L ankle pain after fall. Patient became febrile tonight with temperature of 102.103F, and pharmacy has been consulted for Ceftriaxone dosing for fever of unknown source, possible osteomyelitis.   Plan: Ceftriaxone 2g IV q24h. Ceftriaxone does not require renal/hepatic dosage adjustment, so pharmacy will sign off at this time. Please reconsult if a change in clinical status warrants re-evaluation.   Height: 5\' 1"  (154.9 cm) Weight: 106 lb (48.081 kg) IBW/kg (Calculated) : 47.8  Temp (24hrs), Avg:100.1 F (37.8 C), Min:98.9 F (37.2 C), Max:102.3 F (39.1 C)   Recent Labs Lab 09/10/15 0954 09/10/15 1648 09/11/15 0512 09/12/15 0500 09/12/15 0914 09/13/15 0617  WBC 19.8*  --   --   --  19.2* 13.3*  CREATININE 0.82 0.58 0.40* 0.38*  --  0.49    Estimated Creatinine Clearance: 61.4 mL/min (by C-G formula based on Cr of 0.49).    Allergies  Allergen Reactions  . Aspirin Swelling    unknown    Antimicrobials this admission: 7/21 >> Ceftriaxone >>  Dose adjustments this admission: --  Microbiology results: 7/18 BCx: NGTD 7/18 UCx: multiple species, suggest recollection  7/18 MRSA PCR: negative 7/21 BCx: sent 7/21 UCx: ordered  Thank you for allowing pharmacy to be a part of this patient's care.   Lindell Spar, PharmD, BCPS Pager: [phone] 09/13/2015 10:31 PM

## 2015-09-14 DIAGNOSIS — J690 Pneumonitis due to inhalation of food and vomit: Secondary | ICD-10-CM | POA: Insufficient documentation

## 2015-09-14 DIAGNOSIS — A419 Sepsis, unspecified organism: Principal | ICD-10-CM

## 2015-09-14 LAB — BASIC METABOLIC PANEL
Anion gap: 7 (ref 5–15)
BUN: 8 mg/dL (ref 6–20)
CO2: 26 mmol/L (ref 22–32)
Calcium: 9.1 mg/dL (ref 8.9–10.3)
Chloride: 96 mmol/L — ABNORMAL LOW (ref 101–111)
Creatinine, Ser: 0.79 mg/dL (ref 0.44–1.00)
GFR calc Af Amer: >60 mL/min (ref >60)
GFR calc non Af Amer: >60 mL/min (ref >60)
Glucose, Bld: 113 mg/dL — ABNORMAL HIGH (ref 65–99)
Potassium: 4.3 mmol/L (ref 3.5–5.1)
Sodium: 129 mmol/L — ABNORMAL LOW (ref 135–145)

## 2015-09-14 LAB — CBC
HCT: 35.9 % — ABNORMAL LOW (ref 36.0–46.0)
Hemoglobin: 12.3 g/dL (ref 12.0–15.0)
MCH: 36.3 pg — ABNORMAL HIGH (ref 26.0–34.0)
MCHC: 34.3 g/dL (ref 30.0–36.0)
MCV: 105.9 fL — ABNORMAL HIGH (ref 78.0–100.0)
Platelets: 473 10*3/uL — ABNORMAL HIGH (ref 150–400)
RBC: 3.39 MIL/uL — ABNORMAL LOW (ref 3.87–5.11)
RDW: 13.5 % (ref 11.5–15.5)
WBC: 18.5 10*3/uL — ABNORMAL HIGH (ref 4.0–10.5)

## 2015-09-14 LAB — AMMONIA: Ammonia: 17 umol/L (ref 9–35)

## 2015-09-14 LAB — STREP PNEUMONIAE URINARY ANTIGEN: Strep Pneumo Urinary Antigen: NEGATIVE

## 2015-09-14 MED ORDER — METRONIDAZOLE IN NACL 5-0.79 MG/ML-% IV SOLN
500.0000 mg | Freq: Three times a day (TID) | INTRAVENOUS | Status: DC
  Filled 2015-09-14: qty 100

## 2015-09-14 MED ORDER — PIPERACILLIN-TAZOBACTAM 3.375 G IVPB 30 MIN
3.3750 g | Freq: Once | INTRAVENOUS | Status: AC
  Administered 2015-09-14: 3.375 g via INTRAVENOUS
  Filled 2015-09-14: qty 50

## 2015-09-14 MED ORDER — PIPERACILLIN-TAZOBACTAM 3.375 G IVPB
3.3750 g | Freq: Three times a day (TID) | INTRAVENOUS | Status: DC
  Administered 2015-09-14 – 2015-09-16 (×5): 3.375 g via INTRAVENOUS
  Filled 2015-09-14 (×7): qty 50

## 2015-09-14 MED ORDER — CEFEPIME HCL 1 G IJ SOLR
1.0000 g | Freq: Three times a day (TID) | INTRAMUSCULAR | Status: DC
  Filled 2015-09-14: qty 1

## 2015-09-14 MED ORDER — SODIUM CHLORIDE 0.9 % IV SOLN
INTRAVENOUS | Status: DC
  Administered 2015-09-14 – 2015-09-15 (×4): via INTRAVENOUS

## 2015-09-14 MED ORDER — VANCOMYCIN HCL 500 MG IV SOLR
500.0000 mg | Freq: Two times a day (BID) | INTRAVENOUS | Status: DC
  Administered 2015-09-14 – 2015-09-16 (×4): 500 mg via INTRAVENOUS
  Filled 2015-09-14 (×5): qty 500

## 2015-09-14 MED ORDER — CHLORDIAZEPOXIDE HCL 5 MG PO CAPS
25.0000 mg | ORAL_CAPSULE | Freq: Once | ORAL | Status: AC
  Administered 2015-09-15: 25 mg via ORAL
  Filled 2015-09-14: qty 2

## 2015-09-14 MED ORDER — VANCOMYCIN HCL IN DEXTROSE 1-5 GM/200ML-% IV SOLN
1000.0000 mg | Freq: Once | INTRAVENOUS | Status: AC
  Administered 2015-09-14: 1000 mg via INTRAVENOUS
  Filled 2015-09-14: qty 200

## 2015-09-14 NOTE — Progress Notes (Signed)
[facility] PROGRESS NOTE    Progress Note  Kathy [name]  [id number] DOB: 03-12-1960 DOA: 09/10/2015 PCP: Crecencio Mc, MD     Brief Narrative:   Kathy [name] is an 55 y.o. female with past medical history of alcohol abuse and peripheral alcoholic neuropathy, history of bilateral hip fracture that did not require surgery came to the ED with with a left ankle pain after fall.  Assessment/Plan:   New acute confusional state versus alcohol withdrawal: Worsening acute confusional state likely due to healthcare associated pneumonia, we'll continue to titrate down the psychotropic medications.  Sepsis due to [place] associated pneumonia: She has new fever with with worsening leukocytosis likely due to healthcare associated pneumonia, chest x-ray showed new left lower lobe infiltrate. She was started on IV Vanco and Zosyn Blood cultures and sputum cultures were sent and urine Legionella was sent.  Hyponatremia: Worsening hyponatremia continue aggressive IV fluid hydration monitor strict I's and O's.  Hypokalemia: Resolved  Left closed ankle fracture: Follow-up with orthopedic surgery as an outpatient.   DVT prophylaxis: levenox Family Communication:mother Disposition Plan/Barrier to D/C: Home in 2-3 days Code Status:     Code Status Orders        Start     Ordered   09/10/15 1705  Full code   Continuous     09/10/15 1704    Code Status History    Date Active Date Inactive Code Status Order ID Comments User Context   07/12/2014  4:12 PM 07/16/2014  3:47 PM Full Code [id number]  Bettey Costa, MD Inpatient        IV Access:    Peripheral IV   Procedures and diagnostic studies:   Dg Chest Port 1 View  Sep 23, 2015  CLINICAL DATA:  Fever with productive cough for 3 days.  Smoker. EXAM: PORTABLE CHEST 1 VIEW COMPARISON:  09/10/2015 FINDINGS: Hiatal hernia is unchanged. Cardiac silhouette is within normal limits for size. Diffuse  interstitial prominence has mildly increased from the prior study, and there is now asymmetric hazy density in the left perihilar region. No pleural effusion or pneumothorax is identified. No acute osseous abnormality. IMPRESSION: Increasing interstitial densities with new left perihilar opacity concerning for pneumonia. Electronically Signed   By: Logan Bores M.D.   On: 09-23-2015 23:36     Medical Consultants:    None.  Anti-Infectives:   none  Subjective:    Kathy [name] confused today she doesn't know where she is at.  Objective:    Filed Vitals:   23-Sep-2015 2257 09/14/15 0221 09/14/15 0523 09/14/15 0658  BP:   105/79   Pulse:   102   Temp: 99.8 F (37.7 C) 98.2 F (36.8 C) 100 F (37.8 C) 99 F (37.2 C)  TempSrc: Oral Oral Oral Oral  Resp:   18   Height:      Weight:      SpO2:   94%     Intake/Output Summary (Last 24 hours) at 09/14/15 1053 Last data filed at 09/14/15 0830  Gross per 24 hour  Intake 606.67 ml  Output   1150 ml  Net -543.33 ml   Filed Weights   09/10/15 1410  Weight: 48.081 kg (106 lb)    Exam: General exam: In no acute distress. Respiratory system: Good air movement and clear to auscultation. Cardiovascular system: S1 & S2 heard, RRR.  Gastrointestinal system: Abdomen is nondistended, soft and nontender.  Central nervous system: Alert and oriented x2. No focal neurological  deficits. Extremities: No pedal edema. Skin: No rashes, lesions or ulcers Psychiatry: She has an appropriate judgment and affect both poor insight on her condition.   Data Reviewed:    Labs: Basic Metabolic Panel:  Recent Labs Lab 09/10/15 0954 09/10/15 1648 09/11/15 0512 09/12/15 0500 09/13/15 0617 09/14/15 0532  NA 127* 136 135 132* 128* 129*  K 2.0* 2.5* 3.4* 4.3 3.7 4.3  CL 89* 101 106 100* 95* 96*  CO2 26 26 24 24 23 26   GLUCOSE 117* 132* 95 98 145* 113*  BUN 6 <5* <5* <5* 5* 8  CREATININE 0.82 0.58 0.40* 0.38* 0.49 0.79  CALCIUM  8.6* 8.6* 8.0* 8.8* 9.0 9.1  MG 0.8* 3.4* 1.7  --   --   --    GFR Estimated Creatinine Clearance: 61.4 mL/min (by C-G formula based on Cr of 0.79). Liver Function Tests:  Recent Labs Lab 09/10/15 0954  AST 49*  ALT 21  ALKPHOS 105  BILITOT 0.8  PROT 6.8  ALBUMIN 3.0*   No results for input(s): LIPASE, AMYLASE in the last 168 hours. No results for input(s): AMMONIA in the last 168 hours. Coagulation profile No results for input(s): INR, PROTIME in the last 168 hours.  CBC:  Recent Labs Lab 09/10/15 0954 09/12/15 0914 09/13/15 0617 09/14/15 0532  WBC 19.8* 19.2* 13.3* 18.5*  NEUTROABS 17.1*  --   --   --   HGB 12.2 12.0 12.5 12.3  HCT 34.0* 34.0* 35.4* 35.9*  MCV 102.7* 105.3* 105.0* 105.9*  PLT 290 371 418* 473*   Cardiac Enzymes: No results for input(s): CKTOTAL, CKMB, CKMBINDEX, TROPONINI in the last 168 hours. BNP (last 3 results) No results for input(s): PROBNP in the last 8760 hours. CBG: No results for input(s): GLUCAP in the last 168 hours. D-Dimer: No results for input(s): DDIMER in the last 72 hours. Hgb A1c: No results for input(s): HGBA1C in the last 72 hours. Lipid Profile: No results for input(s): CHOL, HDL, LDLCALC, TRIG, CHOLHDL, LDLDIRECT in the last 72 hours. Thyroid function studies: No results for input(s): TSH, T4TOTAL, T3FREE, THYROIDAB in the last 72 hours.  Invalid input(s): FREET3 Anemia work up: No results for input(s): VITAMINB12, FOLATE, FERRITIN, TIBC, IRON, RETICCTPCT in the last 72 hours. Sepsis Labs:  Recent Labs Lab 09/10/15 0954 09/12/15 0914 09/13/15 0617 09/14/15 0532  WBC 19.8* 19.2* 13.3* 18.5*   Microbiology Recent Results (from the past 240 hour(s))  Urine culture     Status: Abnormal   Collection Time: 09/10/15 10:59 AM  Result Value Ref Range Status   Specimen Description URINE, CATHETERIZED  Final   Special Requests NONE  Final   Culture MULTIPLE SPECIES PRESENT, SUGGEST RECOLLECTION (A)  Final   Report  Status 09/11/2015 FINAL  Final  Culture, blood (routine x 2)     Status: None (Preliminary result)   Collection Time: 09/10/15  4:48 PM  Result Value Ref Range Status   Specimen Description BLOOD RIGHT ARM  Final   Special Requests BOTTLES DRAWN AEROBIC AND ANAEROBIC  5CC  Final   Culture   Final    NO GROWTH 3 DAYS Performed at [facility]    Report Status PENDING  Incomplete  MRSA PCR Screening     Status: None   Collection Time: 09/10/15  6:57 PM  Result Value Ref Range Status   MRSA by PCR NEGATIVE NEGATIVE Final    Comment:        The GeneXpert MRSA Assay (FDA approved for NASAL  specimens only), is one component of a comprehensive MRSA colonization surveillance program. It is not intended to diagnose MRSA infection nor to guide or monitor treatment for MRSA infections.      Medications:   . ceFEPime (MAXIPIME) IV  1 g Intravenous Q8H  . [START ON 09/15/2015] chlordiazePOXIDE  25 mg Oral Once  . enoxaparin (LOVENOX) injection  40 mg Subcutaneous Q24H  . famotidine  40 mg Oral BID  . gabapentin  400 mg Oral TID  . guaiFENesin  600 mg Oral BID  . metronidazole  500 mg Intravenous Q8H  . mirtazapine  15 mg Oral QHS  . multivitamin with minerals  1 tablet Oral Daily  . propranolol  20 mg Oral BID  . QUEtiapine  50 mg Oral QHS  . sodium chloride  1,000 mL Intravenous Once  . sodium chloride flush  3 mL Intravenous Q12H  . thiamine  100 mg Intramuscular Once  . thiamine  100 mg Oral Daily  . vancomycin  500 mg Intravenous Q12H  . vancomycin  1,000 mg Intravenous Once   Continuous Infusions: . sodium chloride      Time spent: 35 min   LOS: 4 days   Charlynne Cousins  [facility] Pager [phone]  *Please refer to [email or web address], password [email or web address] to get updated schedule on who will round on this patient, as hospitalists switch teams weekly. If 7PM-7AM, please contact night-coverage at [email or web address], password [email or web address] for any overnight needs.  09/14/2015,  10:53 AM

## 2015-09-14 NOTE — Progress Notes (Addendum)
Pharmacy Antibiotic Note  Kathy [name] is a 55 y.o. female admitted on 09/10/2015 with closed ankle fracture after fall. Previously leukocytotic on admission (thought d/t fracture), now with fevers developing 7/21 overnight and worsening L perihilar opacity on CXR concerning for PNA.  Pharmacy has been consulted for vancomycin and Zosyn dosing for HAP.  Plan:  Vancomycin 1000 mg IV now, then 500 mg IV q12 hr x 8 days; goal trough 15-20 mcg/mL  Measure vancomycin trough levels at steady state as indicated Zosyn 3.375 g IV given once over 30 minutes, then every 8 hrs by 4-hr infusion Follow clinical course, renal function, culture results as available Follow for de-escalation of antibiotics and LOT  Height: 5\' 1"  (154.9 cm) Weight: 106 lb (48.081 kg) IBW/kg (Calculated) : 47.8  Temp (24hrs), Avg:99.8 F (37.7 C), Min:98.2 F (36.8 C), Max:102.3 F (39.1 C)   Recent Labs Lab 09/10/15 0954 09/10/15 1648 09/11/15 0512 09/12/15 0500 09/12/15 0914 09/13/15 0617 09/14/15 0532  WBC 19.8*  --   --   --  19.2* 13.3* 18.5*  CREATININE 0.82 0.58 0.40* 0.38*  --  0.49 0.79    Estimated Creatinine Clearance: 61.4 mL/min (by C-G formula based on Cr of 0.79).    Allergies  Allergen Reactions  . Aspirin Swelling    unknown    Antimicrobials this admission: 7/18 Rocephin/Zithromax x 1 7/21 Rocephin 2g x 1 7/22 vancomycin >> (7/29) 7/22 Zosyn >> (7/29)  Dose adjustments this admission: ---  Microbiology results: 7/18 BCx (x1): NGTD 7/18 UCx: multiple species, consider recollect  7/18 MRSA PCR: negative 7/21 BCx (x1): sent 7/22 BCx: sent  Thank you for allowing pharmacy to be a part of this patient's care.  Reuel Boom, PharmD, BCPS Pager: [phone] 09/14/2015, 10:17 AM

## 2015-09-14 NOTE — Progress Notes (Signed)
PT Cancellation Note  Patient Details Name: Kathy [name] MRN: [id number] DOB: January 31, 1961   Cancelled Treatment:    Reason Eval/Treat Not Completed: Patient's level of consciousness (restles and lethargic at this time, unable to  participate)   Claretha Cooper 09/14/2015, 4:42 PM Tresa Endo PT [phone]

## 2015-09-15 LAB — CULTURE, BLOOD (ROUTINE X 2): Culture: NO GROWTH

## 2015-09-15 LAB — BASIC METABOLIC PANEL
Anion gap: 8 (ref 5–15)
BUN: <5 mg/dL — ABNORMAL LOW (ref 6–20)
CO2: 25 mmol/L (ref 22–32)
Calcium: 8.5 mg/dL — ABNORMAL LOW (ref 8.9–10.3)
Chloride: 102 mmol/L (ref 101–111)
Creatinine, Ser: 0.53 mg/dL (ref 0.44–1.00)
GFR calc Af Amer: >60 mL/min (ref >60)
GFR calc non Af Amer: >60 mL/min (ref >60)
Glucose, Bld: 158 mg/dL — ABNORMAL HIGH (ref 65–99)
Potassium: 3.6 mmol/L (ref 3.5–5.1)
Sodium: 135 mmol/L (ref 135–145)

## 2015-09-15 LAB — HIV ANTIBODY (ROUTINE TESTING W REFLEX): HIV Screen 4th Generation wRfx: NONREACTIVE

## 2015-09-15 LAB — URINE CULTURE: Culture: <10000 — AB

## 2015-09-15 LAB — EXPECTORATED SPUTUM ASSESSMENT W GRAM STAIN, RFLX TO RESP C

## 2015-09-15 LAB — EXPECTORATED SPUTUM ASSESSMENT W REFEX TO RESP CULTURE

## 2015-09-15 NOTE — Progress Notes (Signed)
[facility] PROGRESS NOTE    Progress Note  Kathy [name]  [id number] DOB: [birth year] DOA: 09/10/2015 PCP: Crecencio Mc, MD     Brief Narrative:   Kathy [name] is an 55 y.o. female with past medical history of alcohol abuse and peripheral alcoholic neuropathy, history of bilateral hip fracture that did not require surgery came to the ED with with a left ankle pain after fall.  Assessment/Plan:   New acute confusional state versus alcohol withdrawal: Likely due to healthcare associated pneumonia, we'll continue to titrate down the psychotropic medications. Ammonia level was checked which was found, she appears to be improved compared to yesterday.  Sepsis due to [place] associated pneumonia: She has defervesced, chest x-ray done on 09/14/2015 show a new left lower lobe infiltrates  Continue IV Vanco and Zosyn Blood cultures and sputum cultures were sent and urine Legionella negative.  Hyponatremia: Resolved without a fluid hydration.  Hypokalemia: Resolved  Left closed ankle fracture: Follow-up with orthopedic surgery as an outpatient.   DVT prophylaxis: levenox Family Communication:mother Disposition Plan/Barrier to D/C: Home in am Code Status:     Code Status Orders        Start     Ordered   09/10/15 1705  Full code   Continuous     09/10/15 1704    Code Status History    Date Active Date Inactive Code Status Order ID Comments User Context   07/12/2014  4:12 PM 07/16/2014  3:47 PM Full Code [id number]  Bettey Costa, MD Inpatient        IV Access:    Peripheral IV   Procedures and diagnostic studies:   Dg Chest Port 1 View  Result Date: 09/13/2015 CLINICAL DATA:  Fever with productive cough for 3 days.  Smoker. EXAM: PORTABLE CHEST 1 VIEW COMPARISON:  09/10/2015 FINDINGS: Hiatal hernia is unchanged. Cardiac silhouette is within normal limits for size. Diffuse interstitial prominence has mildly increased from the prior  study, and there is now asymmetric hazy density in the left perihilar region. No pleural effusion or pneumothorax is identified. No acute osseous abnormality. IMPRESSION: Increasing interstitial densities with new left perihilar opacity concerning for pneumonia. Electronically Signed   By: Logan Bores M.D.   On: 09/13/2015 23:36     Medical Consultants:    None.  Anti-Infectives:   none  Subjective:    Kathy [name] more awake today complaining of bilateral lower extremity pain.  Objective:    Vitals:   09/14/15 1408 09/14/15 2117 09/14/15 2258 09/15/15 0627  BP: (!) 126/99 113/75  110/69  Pulse: 100 (!) 107  86  Resp: 20 18  18   Temp: 99.2 F (37.3 C) (!) 101.8 F (38.8 C) 99.1 F (37.3 C) 98.9 F (37.2 C)  TempSrc: Oral Oral Oral Oral  SpO2: 98% 90%  100%  Weight:      Height:        Intake/Output Summary (Last 24 hours) at 09/15/15 1027 Last data filed at 09/15/15 0932  Gross per 24 hour  Intake          2441.25 ml  Output             1350 ml  Net          1091.25 ml   Filed Weights   09/10/15 1410  Weight: 48.1 kg (106 lb)    Exam: General exam: In no acute distress. Respiratory system: Good air movement and clear to auscultation. Cardiovascular system: S1 &  S2 heard, RRR.  Gastrointestinal system: Abdomen is nondistended, soft and nontender.  Central nervous system: Alert and oriented x2. No focal neurological deficits. Extremities: No pedal edema. Skin: No rashes, lesions or ulcers Psychiatry: She has an appropriate judgment and affect both poor insight on her condition.   Data Reviewed:    Labs: Basic Metabolic Panel:  Recent Labs Lab 09/10/15 0954 09/10/15 1648 09/11/15 0512 09/12/15 0500 09/13/15 0617 09/14/15 0532 09/15/15 0951  NA 127* 136 135 132* 128* 129* 135  K 2.0* 2.5* 3.4* 4.3 3.7 4.3 3.6  CL 89* 101 106 100* 95* 96* 102  CO2 26 26 24 24 23 26 25   GLUCOSE 117* 132* 95 98 145* 113* 158*  BUN 6 <5* <5* <5* 5* 8 <5*   CREATININE 0.82 0.58 0.40* 0.38* 0.49 0.79 0.53  CALCIUM 8.6* 8.6* 8.0* 8.8* 9.0 9.1 8.5*  MG 0.8* 3.4* 1.7  --   --   --   --    GFR Estimated Creatinine Clearance: 61.4 mL/min (by C-G formula based on SCr of 0.8 mg/dL). Liver Function Tests:  Recent Labs Lab 09/10/15 0954  AST 49*  ALT 21  ALKPHOS 105  BILITOT 0.8  PROT 6.8  ALBUMIN 3.0*   No results for input(s): LIPASE, AMYLASE in the last 168 hours.  Recent Labs Lab 09/14/15 1602  AMMONIA 17   Coagulation profile No results for input(s): INR, PROTIME in the last 168 hours.  CBC:  Recent Labs Lab 09/10/15 0954 09/12/15 0914 09/13/15 0617 09/14/15 0532  WBC 19.8* 19.2* 13.3* 18.5*  NEUTROABS 17.1*  --   --   --   HGB 12.2 12.0 12.5 12.3  HCT 34.0* 34.0* 35.4* 35.9*  MCV 102.7* 105.3* 105.0* 105.9*  PLT 290 371 418* 473*   Cardiac Enzymes: No results for input(s): CKTOTAL, CKMB, CKMBINDEX, TROPONINI in the last 168 hours. BNP (last 3 results) No results for input(s): PROBNP in the last 8760 hours. CBG: No results for input(s): GLUCAP in the last 168 hours. D-Dimer: No results for input(s): DDIMER in the last 72 hours. Hgb A1c: No results for input(s): HGBA1C in the last 72 hours. Lipid Profile: No results for input(s): CHOL, HDL, LDLCALC, TRIG, CHOLHDL, LDLDIRECT in the last 72 hours. Thyroid function studies: No results for input(s): TSH, T4TOTAL, T3FREE, THYROIDAB in the last 72 hours.  Invalid input(s): FREET3 Anemia work up: No results for input(s): VITAMINB12, FOLATE, FERRITIN, TIBC, IRON, RETICCTPCT in the last 72 hours. Sepsis Labs:  Recent Labs Lab 09/10/15 0954 09/12/15 0914 09/13/15 0617 09/14/15 0532  WBC 19.8* 19.2* 13.3* 18.5*   Microbiology Recent Results (from the past 240 hour(s))  Urine culture     Status: Abnormal   Collection Time: 09/10/15 10:59 AM  Result Value Ref Range Status   Specimen Description URINE, CATHETERIZED  Final   Special Requests NONE  Final    Culture MULTIPLE SPECIES PRESENT, SUGGEST RECOLLECTION (A)  Final   Report Status 09/11/2015 FINAL  Final  Culture, blood (routine x 2)     Status: None (Preliminary result)   Collection Time: 09/10/15  4:48 PM  Result Value Ref Range Status   Specimen Description BLOOD RIGHT ARM  Final   Special Requests BOTTLES DRAWN AEROBIC AND ANAEROBIC  5CC  Final   Culture   Final    NO GROWTH 4 DAYS Performed at [facility]    Report Status PENDING  Incomplete  MRSA PCR Screening     Status: None   Collection  Time: 09/10/15  6:57 PM  Result Value Ref Range Status   MRSA by PCR NEGATIVE NEGATIVE Final    Comment:        The GeneXpert MRSA Assay (FDA approved for NASAL specimens only), is one component of a comprehensive MRSA colonization surveillance program. It is not intended to diagnose MRSA infection nor to guide or monitor treatment for MRSA infections.   Culture, blood (single)     Status: None (Preliminary result)   Collection Time: 09/13/15  9:58 PM  Result Value Ref Range Status   Specimen Description BLOOD RIGHT ARM  Final   Special Requests BOTTLES DRAWN AEROBIC AND ANAEROBIC 6CC  Final   Culture   Final    NO GROWTH < 24 HOURS Performed at [facility]    Report Status PENDING  Incomplete  Culture, expectorated sputum-assessment     Status: None   Collection Time: 09/15/15  2:25 AM  Result Value Ref Range Status   Specimen Description EXPECTORATED SPUTUM  Final   Special Requests NONE  Final   Sputum evaluation   Final    MICROSCOPIC FINDINGS SUGGEST THAT THIS SPECIMEN IS NOT REPRESENTATIVE OF LOWER RESPIRATORY SECRETIONS. PLEASE RECOLLECT. SPOKE TO L.MCNULTY RN [phone] [id number] [email or web address]    Report Status 09/15/2015 FINAL  Final     Medications:   . enoxaparin (LOVENOX) injection  40 mg Subcutaneous Q24H  . famotidine  40 mg Oral BID  . gabapentin  400 mg Oral TID  . guaiFENesin  600 mg Oral BID  . mirtazapine  15 mg Oral QHS  . multivitamin with  minerals  1 tablet Oral Daily  . piperacillin-tazobactam (ZOSYN)  IV  3.375 g Intravenous Q8H  . propranolol  20 mg Oral BID  . QUEtiapine  50 mg Oral QHS  . sodium chloride  1,000 mL Intravenous Once  . sodium chloride flush  3 mL Intravenous Q12H  . thiamine  100 mg Intramuscular Once  . thiamine  100 mg Oral Daily  . vancomycin  500 mg Intravenous Q12H   Continuous Infusions: . sodium chloride 75 mL/hr at 09/15/15 0932    Time spent: 25 min   LOS: 4 days   Charlynne Cousins  [facility] Pager [phone]  *Please refer to [email or web address], password [email or web address] to get updated schedule on who will round on this patient, as hospitalists switch teams weekly. If 7PM-7AM, please contact night-coverage at [email or web address], password [email or web address] for any overnight needs.  09/15/2015, 10:27 AM

## 2015-09-16 ENCOUNTER — Other Ambulatory Visit: Payer: Self-pay

## 2015-09-16 DIAGNOSIS — J690 Pneumonitis due to inhalation of food and vomit: Secondary | ICD-10-CM

## 2015-09-16 DIAGNOSIS — F1024 Alcohol dependence with alcohol-induced mood disorder: Secondary | ICD-10-CM

## 2015-09-16 LAB — BASIC METABOLIC PANEL
Anion gap: 7 (ref 5–15)
BUN: <5 mg/dL — ABNORMAL LOW (ref 6–20)
CO2: 23 mmol/L (ref 22–32)
Calcium: 8.6 mg/dL — ABNORMAL LOW (ref 8.9–10.3)
Chloride: 108 mmol/L (ref 101–111)
Creatinine, Ser: 0.49 mg/dL (ref 0.44–1.00)
GFR calc Af Amer: >60 mL/min (ref >60)
GFR calc non Af Amer: >60 mL/min (ref >60)
Glucose, Bld: 105 mg/dL — ABNORMAL HIGH (ref 65–99)
Potassium: 3.7 mmol/L (ref 3.5–5.1)
Sodium: 138 mmol/L (ref 135–145)

## 2015-09-16 LAB — CBC
HCT: 32.2 % — ABNORMAL LOW (ref 36.0–46.0)
Hemoglobin: 10.7 g/dL — ABNORMAL LOW (ref 12.0–15.0)
MCH: 35.9 pg — ABNORMAL HIGH (ref 26.0–34.0)
MCHC: 33.2 g/dL (ref 30.0–36.0)
MCV: 108.1 fL — ABNORMAL HIGH (ref 78.0–100.0)
Platelets: 503 10*3/uL — ABNORMAL HIGH (ref 150–400)
RBC: 2.98 MIL/uL — ABNORMAL LOW (ref 3.87–5.11)
RDW: 13.5 % (ref 11.5–15.5)
WBC: 9.5 10*3/uL (ref 4.0–10.5)

## 2015-09-16 LAB — C DIFFICILE QUICK SCREEN W PCR REFLEX
C Diff antigen: POSITIVE — AB
C Diff toxin: NEGATIVE

## 2015-09-16 LAB — CLOSTRIDIUM DIFFICILE BY PCR: Toxigenic C. Difficile by PCR: POSITIVE — AB

## 2015-09-16 MED ORDER — LEVOFLOXACIN 750 MG PO TABS
750.0000 mg | ORAL_TABLET | Freq: Every day | ORAL | Status: DC
  Administered 2015-09-16: 750 mg via ORAL
  Filled 2015-09-16: qty 1

## 2015-09-16 MED ORDER — THIAMINE HCL 100 MG PO TABS: 100.0000 mg | ORAL_TABLET | Freq: Every day | ORAL | Status: DC

## 2015-09-16 MED ORDER — LEVOFLOXACIN 750 MG PO TABS: 750.0000 mg | ORAL_TABLET | Freq: Every day | ORAL | 0 refills | Status: DC

## 2015-09-16 NOTE — Clinical Social Work Placement (Signed)
Patient is set to discharge to [facility] today. Patient & mother, Kathy [name] made aware. Discharge packet given to RN, Pam. Patient's mother to transport to SNF around 1:00.     Raynaldo Opitz, [facility] Clinical Social Worker cell #: [phone]    CLINICAL SOCIAL WORK PLACEMENT  NOTE  Date:  09/16/2015  Patient Details  Name: [name] MRN: [id number] Date of Birth: 1960/09/06  Clinical Social Work is seeking post-discharge placement for this patient at the [place] level of care (*CSW will initial, date and re-position this form in  chart as items are completed):  Yes   Patient/family provided with [facility] Work Department's list of facilities offering this level of care within the geographic area requested by the patient (or if unable, by the patient's family).  Yes   Patient/family informed of their freedom to choose among providers that offer the needed level of care, that participate in Medicare, Medicaid or managed care program needed by the patient, have an available bed and are willing to accept the patient.  Yes   Patient/family informed of [facility]'s ownership interest in [facility] and [facility], as well as of the fact that they are under no obligation to receive care at these facilities.  PASRR submitted to EDS on 09/11/15     PASRR number received on 09/13/15     Existing PASRR number confirmed on       FL2 transmitted to all facilities in geographic area requested by pt/family on 09/11/15     FL2 transmitted to all facilities within larger geographic area on       Patient informed that his/her managed care company has contracts with or will negotiate with certain facilities, including the following:        Yes   Patient/family informed of bed offers received.  Patient chooses bed at [facility]     Physician recommends and patient chooses bed at      Patient to be  transferred to [facility] on 09/16/15.  Patient to be transferred to facility by patient's mother to transport     Patient family notified on 09/16/15 of transfer.  Name of family member notified:  patient's mother via phone     PHYSICIAN       Additional Comment:    _______________________________________________ Standley Brooking, LCSW 09/16/2015, 11:55 AM

## 2015-09-16 NOTE — Discharge Summary (Signed)
Physician Discharge Summary  Kathy [name] [id number] DOB: 12-Aug-1960 DOA: 09/10/2015  PCP: Crecencio Mc, MD  Admit date: 09/10/2015 Discharge date: 09/16/2015  Time spent: 35 minutes  Recommendations for Outpatient Follow-up:  1. She will go to [facility] facility. 2. Avoid benzodiazepines at that place   Discharge Diagnoses:  Active Problems:   Alcohol dependence with alcohol-induced mood disorder (HCC)   Hypokalemia   Hyponatremia   Sepsis ([place])   Closed left ankle fracture   Acute confusional state   Aspiration pneumonia [facility])   Discharge Condition: stable  Diet recommendation: regular  Filed Weights   09/10/15 1410  Weight: 48.1 kg (106 lb)    History of present illness:  55 year old with medical history for excessive alcohol abuse, significant peripheral neuropathy, hepatic state ptosis that comes in to the hospital after a fall at [facility] where she was for alcohol rehabilitation.  Hospital Course:  Acute confusional state/acute encephalopathy, versus severe alcohol withdrawals: She came into the hospital and started on Librium which made her encephalopathy significantly better. After several days she became acutely confused again and it was thought to be secondary to an fissures process which resolved. She was discharged off benzodiazepine and her encephalopathy had resolved.  Aspiration pneumonia: PHOs chest x-ray was negative on admission a repeat a chest x-ray showed a right lower lobe infiltrate. When accompanied leukocytosis and fevers.  She was treated empirically once it happen with vancomycin and Zosyn then the escalated to Levaquin and she remained afebrile she will continue this for 5 additional days.  Hyponatremia: Likely due to dehydration does resolve with IV fluid hydration.  Hyperkalemia: Likely due to dehydration resolved.    Procedures:  CXR  Consultations:  none  Discharge Exam: Vitals:    09/15/15 2028 09/16/15 0438  BP: 124/66 124/74  Pulse: 100 (!) 102  Resp: 18 16  Temp: 99.8 F (37.7 C) 98.9 F (37.2 C)    General: A&O x3 Cardiovascular: RRR Respiratory: good air movement CTA B/L  Discharge Instructions   Discharge Instructions    Diet - low sodium heart healthy    Complete by:  As directed   Diet - low sodium heart healthy    Complete by:  As directed   Increase activity slowly    Complete by:  As directed   Increase activity slowly    Complete by:  As directed     Current Discharge Medication List    START taking these medications   Details  levofloxacin (LEVAQUIN) 750 MG tablet Take 1 tablet (750 mg total) by mouth daily. Qty: 5 tablet, Refills: 0      CONTINUE these medications which have CHANGED   Details  thiamine 100 MG tablet Take 1 tablet (100 mg total) by mouth daily.      CONTINUE these medications which have NOT CHANGED   Details  alum & mag hydroxide-simeth (MAALOX/MYLANTA) 200-200-20 MG/5ML suspension Take 15-30 mLs by mouth every 6 (six) hours as needed for indigestion or heartburn.    cetirizine (ZYRTEC) 10 MG tablet Take 10 mg by mouth daily.    cloNIDine (CATAPRES - DOSED IN MG/24 HR) 0.1 mg/24hr patch Place 0.1 mg onto the skin once a week. Placed 09/08/15    dicyclomine (BENTYL) 20 MG tablet Take 20 mg by mouth every 6 (six) hours as needed for spasms.    methocarbamol (ROBAXIN) 500 MG tablet Take 500 mg by mouth every 6 (six) hours as needed for muscle spasms. 10 days  started 09/08/15    omeprazole (PRILOSEC) 40 MG capsule Take 1 capsule (40 mg total) by mouth 2 (two) times daily. Qty: 180 capsule, Refills: 1    ondansetron (ZOFRAN) 8 MG tablet Take 8 mg by mouth 2 (two) times daily as needed for nausea or vomiting.    QUEtiapine (SEROQUEL) 50 MG tablet Take 50 mg by mouth at bedtime.    mirtazapine (REMERON) 15 MG tablet Take 1 tablet (15 mg total) by mouth at bedtime. Qty: 30 tablet, Refills: 5    potassium chloride SA  (KLOR-CON M20) 20 MEQ tablet Take 1 tablet (20 mEq total) by mouth 2 (two) times daily. Qty: 14 tablet, Refills: 0    pregabalin (LYRICA) 50 MG capsule Take 1 capsule (50 mg total) by mouth 3 (three) times daily. Qty: 90 capsule, Refills: 0    traMADol (ULTRAM) 50 MG tablet Take 1 tablet (50 mg total) by mouth 2 (two) times daily. If needed for toe pain . Qty: 60 tablet, Refills: 1      STOP taking these medications     benzonatate (TESSALON) 100 MG capsule      Fructose-Dextrose-Phosphor Acd (NAUSEA CONTROL PO)      gabapentin (NEURONTIN) 400 MG capsule      guaiFENesin (MUCINEX) 600 MG 12 hr tablet      ibuprofen (ADVIL,MOTRIN) 600 MG tablet      loperamide (IMODIUM A-D) 2 MG tablet      OVER THE COUNTER MEDICATION      Probiotic Product (PROBIOTIC PO)      simethicone (MYLICON) 80 MG chewable tablet      ALPRAZolam (XANAX) 0.5 MG tablet      cephALEXin (KEFLEX) 500 MG capsule      promethazine (PHENERGAN) 12.5 MG tablet      promethazine (PHENERGAN) 25 MG suppository      propranolol (INDERAL) 60 MG tablet        Allergies  Allergen Reactions  . Aspirin Swelling    unknown   Follow-up Information    Nita Sells, MD. Call in 2 weeks.   Specialty:  Orthopedic Surgery Contact information: [address] [place] [address] [phone]            The results of significant diagnostics from this hospitalization (including imaging, microbiology, ancillary and laboratory) are listed below for reference.    Significant Diagnostic Studies: Dg Chest 2 View  Result Date: 09/10/2015 CLINICAL DATA:  Cough for 1 day EXAM: CHEST  2 VIEW COMPARISON:  August 24, 2015 FINDINGS: There is mild generalized interstitial thickening without frank edema or consolidation. Heart size and pulmonary vascularity are normal. No adenopathy. There is a focal hiatal hernia. No bone lesions. IMPRESSION: Focal hiatal hernia. Interstitium shows mild  generalized thickening without frank edema or consolidation. This appearance is stable compared to recent prior study. Electronically Signed   By: Lowella Grip III M.D.   On: 09/10/2015 16:01   Dg Chest 2 View  Result Date: 08/24/2015 CLINICAL DATA:  Chest pain EXAM: CHEST  2 VIEW COMPARISON:  11/23/2013 FINDINGS: Heart size and vascularity normal. Lungs are clear without infiltrate or effusion. Moderate large hiatal hernia. Negative for mass or adenopathy IMPRESSION: Hiatal hernia.  No acute cardiopulmonary abnormality. Electronically Signed   By: Franchot Gallo M.D.   On: 08/24/2015 17:23   Dg Ankle Complete Left  Result Date: 09/10/2015 CLINICAL DATA:  55 year old female fell last night with left ankle injury. Anterior lateral pain. Swelling. Initial encounter. EXAM: LEFT  ANKLE COMPLETE - 3+ VIEW COMPARISON:  None. FINDINGS: Oblique slightly displaced fracture left lateral malleolus with adjacent soft tissue swelling. No other fracture noted. Ankle mortise appears intact. Plantar spur. IMPRESSION: Oblique slightly displaced fracture left lateral malleolus with adjacent soft tissue swelling. Electronically Signed   By: Genia Del M.D.   On: 09/10/2015 10:09   Ct Head Wo Contrast  Result Date: 09/10/2015 CLINICAL DATA:  Fall EXAM: CT HEAD WITHOUT CONTRAST TECHNIQUE: Contiguous axial images were obtained from the base of the skull through the vertex without intravenous contrast. COMPARISON:  None. FINDINGS: No skull fracture is noted. Paranasal sinuses and mastoid air cells are unremarkable. No intracranial hemorrhage, mass effect or midline shift. Mild cerebral atrophy. No acute cortical infarction. No mass lesion is noted on this unenhanced scan. No hydrocephalus. No intra or extra-axial fluid collection. IMPRESSION: No acute intracranial abnormality.  Mild cerebral atrophy. Electronically Signed   By: Lahoma Crocker M.D.   On: 09/10/2015 12:51   Dg Chest Port 1 View  Result Date:  09/13/2015 CLINICAL DATA:  Fever with productive cough for 3 days.  Smoker. EXAM: PORTABLE CHEST 1 VIEW COMPARISON:  09/10/2015 FINDINGS: Hiatal hernia is unchanged. Cardiac silhouette is within normal limits for size. Diffuse interstitial prominence has mildly increased from the prior study, and there is now asymmetric hazy density in the left perihilar region. No pleural effusion or pneumothorax is identified. No acute osseous abnormality. IMPRESSION: Increasing interstitial densities with new left perihilar opacity concerning for pneumonia. Electronically Signed   By: Logan Bores M.D.   On: 09/13/2015 23:36    Microbiology: Recent Results (from the past 240 hour(s))  Urine culture     Status: Abnormal   Collection Time: 09/10/15 10:59 AM  Result Value Ref Range Status   Specimen Description URINE, CATHETERIZED  Final   Special Requests NONE  Final   Culture MULTIPLE SPECIES PRESENT, SUGGEST RECOLLECTION (A)  Final   Report Status 09/11/2015 FINAL  Final  Culture, blood (routine x 2)     Status: None   Collection Time: 09/10/15  4:48 PM  Result Value Ref Range Status   Specimen Description BLOOD RIGHT ARM  Final   Special Requests BOTTLES DRAWN AEROBIC AND ANAEROBIC  5CC  Final   Culture   Final    NO GROWTH 5 DAYS Performed at [facility]    Report Status 09/15/2015 FINAL  Final  MRSA PCR Screening     Status: None   Collection Time: 09/10/15  6:57 PM  Result Value Ref Range Status   MRSA by PCR NEGATIVE NEGATIVE Final    Comment:        The GeneXpert MRSA Assay (FDA approved for NASAL specimens only), is one component of a comprehensive MRSA colonization surveillance program. It is not intended to diagnose MRSA infection nor to guide or monitor treatment for MRSA infections.   Culture, blood (single)     Status: None (Preliminary result)   Collection Time: 09/13/15  9:58 PM  Result Value Ref Range Status   Specimen Description BLOOD RIGHT ARM  Final   Special  Requests BOTTLES DRAWN AEROBIC AND ANAEROBIC 6CC  Final   Culture   Final    NO GROWTH 2 DAYS Performed at [facility]    Report Status PENDING  Incomplete  Culture, blood (routine x 2) Call MD if unable to obtain prior to antibiotics being given     Status: None (Preliminary result)   Collection Time: 09/14/15 10:36 AM  Result Value Ref Range Status   Specimen Description BLOOD HAND  Final   Special Requests BOTTLES DRAWN AEROBIC ONLY .5CC  Final   Culture   Final    NO GROWTH 1 DAY Performed at [facility]    Report Status PENDING  Incomplete  Culture, blood (routine x 2) Call MD if unable to obtain prior to antibiotics being given     Status: None (Preliminary result)   Collection Time: 09/14/15 10:36 AM  Result Value Ref Range Status   Specimen Description BLOOD RIGHT ANTECUBITAL  Final   Special Requests BOTTLES DRAWN AEROBIC AND ANAEROBIC 10CC  Final   Culture   Final    NO GROWTH 1 DAY Performed at [facility]    Report Status PENDING  Incomplete  Culture, Urine     Status: Abnormal   Collection Time: 09/14/15  5:44 PM  Result Value Ref Range Status   Specimen Description URINE, CLEAN CATCH  Final   Special Requests NONE  Final   Culture (A)  Final    <10,000 COLONIES/mL INSIGNIFICANT GROWTH Performed at [facility]    Report Status 09/15/2015 FINAL  Final  Culture, expectorated sputum-assessment     Status: None   Collection Time: 09/15/15  2:25 AM  Result Value Ref Range Status   Specimen Description EXPECTORATED SPUTUM  Final   Special Requests NONE  Final   Sputum evaluation   Final    MICROSCOPIC FINDINGS SUGGEST THAT THIS SPECIMEN IS NOT REPRESENTATIVE OF LOWER RESPIRATORY SECRETIONS. PLEASE RECOLLECT. SPOKE TO L.MCNULTY RN [phone] [id number] A.[facility]    Report Status 09/15/2015 FINAL  Final  C difficile quick scan w PCR reflex     Status: Abnormal   Collection Time: 09/16/15  7:20 AM  Result Value Ref Range Status   C Diff  antigen POSITIVE (A) NEGATIVE Final   C Diff toxin NEGATIVE NEGATIVE Final   C Diff interpretation Results are indeterminate. See PCR results.  Final     Labs: Basic Metabolic Panel:  Recent Labs Lab 09/10/15 0954 09/10/15 1648 09/11/15 0512 09/12/15 0500 09/13/15 0617 09/14/15 0532 09/15/15 0951 09/16/15 0455  NA 127* 136 135 132* 128* 129* 135 138  K 2.0* 2.5* 3.4* 4.3 3.7 4.3 3.6 3.7  CL 89* 101 106 100* 95* 96* 102 108  CO2 26 26 24 24 23 26 25 23   GLUCOSE 117* 132* 95 98 145* 113* 158* 105*  BUN 6 <5* <5* <5* 5* 8 <5* <5*  CREATININE 0.82 0.58 0.40* 0.38* 0.49 0.79 0.53 0.49  CALCIUM 8.6* 8.6* 8.0* 8.8* 9.0 9.1 8.5* 8.6*  MG 0.8* 3.4* 1.7  --   --   --   --   --    Liver Function Tests:  Recent Labs Lab 09/10/15 0954  AST 49*  ALT 21  ALKPHOS 105  BILITOT 0.8  PROT 6.8  ALBUMIN 3.0*   No results for input(s): LIPASE, AMYLASE in the last 168 hours.  Recent Labs Lab 09/14/15 1602  AMMONIA 17   CBC:  Recent Labs Lab 09/10/15 0954 09/12/15 0914 09/13/15 0617 09/14/15 0532 09/16/15 0455  WBC 19.8* 19.2* 13.3* 18.5* 9.5  NEUTROABS 17.1*  --   --   --   --   HGB 12.2 12.0 12.5 12.3 10.7*  HCT 34.0* 34.0* 35.4* 35.9* 32.2*  MCV 102.7* 105.3* 105.0* 105.9* 108.1*  PLT 290 371 418* 473* 503*   Cardiac Enzymes: No results for input(s): CKTOTAL, CKMB, CKMBINDEX, TROPONINI in the  last 168 hours. BNP: BNP (last 3 results) No results for input(s): BNP in the last 8760 hours.  ProBNP (last 3 results) No results for input(s): PROBNP in the last 8760 hours.  CBG: No results for input(s): GLUCAP in the last 168 hours.    Signed:  Charlynne Cousins MD.  [facility] 09/16/2015, 10:27 AM

## 2015-09-17 ENCOUNTER — Other Ambulatory Visit: Payer: Self-pay

## 2015-09-17 MED ORDER — TRAMADOL HCL 50 MG PO TABS: 50.0000 mg | ORAL_TABLET | Freq: Two times a day (BID) | ORAL | 5 refills | Status: DC

## 2015-09-17 MED ORDER — PREGABALIN 50 MG PO CAPS: 50.0000 mg | ORAL_CAPSULE | Freq: Three times a day (TID) | ORAL | 5 refills | Status: DC

## 2015-09-17 NOTE — Telephone Encounter (Signed)
Rx faxed to [facility] @ [phone], phone number [phone]  

## 2015-09-18 LAB — CULTURE, BLOOD (SINGLE): Culture: NO GROWTH

## 2015-09-19 ENCOUNTER — Encounter: Payer: Self-pay | Admitting: Internal Medicine

## 2015-09-19 ENCOUNTER — Non-Acute Institutional Stay (SKILLED_NURSING_FACILITY): Payer: BLUE CROSS/BLUE SHIELD | Admitting: Internal Medicine

## 2015-09-19 DIAGNOSIS — Z789 Other specified health status: Secondary | ICD-10-CM | POA: Diagnosis not present

## 2015-09-19 DIAGNOSIS — K2920 Alcoholic gastritis without bleeding: Secondary | ICD-10-CM | POA: Diagnosis not present

## 2015-09-19 DIAGNOSIS — D638 Anemia in other chronic diseases classified elsewhere: Secondary | ICD-10-CM | POA: Diagnosis not present

## 2015-09-19 DIAGNOSIS — S82892S Other fracture of left lower leg, sequela: Secondary | ICD-10-CM

## 2015-09-19 DIAGNOSIS — F1094 Alcohol use, unspecified with alcohol-induced mood disorder: Secondary | ICD-10-CM | POA: Diagnosis not present

## 2015-09-19 DIAGNOSIS — G629 Polyneuropathy, unspecified: Secondary | ICD-10-CM

## 2015-09-19 DIAGNOSIS — I10 Essential (primary) hypertension: Secondary | ICD-10-CM

## 2015-09-19 DIAGNOSIS — R531 Weakness: Secondary | ICD-10-CM

## 2015-09-19 DIAGNOSIS — J690 Pneumonitis due to inhalation of food and vomit: Secondary | ICD-10-CM | POA: Diagnosis not present

## 2015-09-19 DIAGNOSIS — Z7289 Other problems related to lifestyle: Secondary | ICD-10-CM

## 2015-09-19 LAB — CULTURE, BLOOD (ROUTINE X 2)
Culture: NO GROWTH
Culture: NO GROWTH

## 2015-09-19 NOTE — Progress Notes (Signed)
LOCATION: [name]  PCP: Crecencio Mc, MD   Code Status: Full Code  Goals of care: Advanced Directive information Advanced Directives 09/10/2015  Does patient have an advance directive? No  Would patient like information on creating an advanced directive? No - patient declined information       Extended Emergency Contact Information Primary Emergency Contact: Philomena Course [place] of [place] Mobile Phone: [phone] Relation: Daughter Secondary Emergency Contact: King,Roger  [place] Phone: [phone] Relation: Father Mother: Alvia Grove [place] of [place] Phone: [phone]   Allergies  Allergen Reactions  . Aspirin Swelling    unknown    Chief Complaint  Patient presents with  . New Admit To SNF    New Admission     HPI:  Patient is a 55 y.o. female seen today for short term rehabilitation post hospital admission from 09/10/15-09/16/15 with AMS with alcohol withdrawal and aspiration pneumonia. She required librium and was started on antibiotics. She had hyponatremia and hyperkalemia that resolved with iv fluids. Of note, she had recent closed left ankle fracture. She is seen in her room today. She complaints of her left ankle pain.   Review of Systems:  Constitutional: Negative for fever, chills, diaphoresis. Energy level is slowly coming back.  HENT: Negative for headache, congestion, nasal discharge Eyes: Negative for blurred vision, double vision and discharge.  Respiratory: Negative for shortness of breath and wheezing. Positive for cough with clear to yellow phlegm.  Cardiovascular: Negative for chest pain, palpitations, leg swelling.  Gastrointestinal: Negative for heartburn, nausea, vomiting, abdominal pain. Last bowel movement was yesterday.   Genitourinary: Negative for dysuria and flank pain.  Musculoskeletal: Negative for back pain, fall in the facility.  Skin: Negative for itching, rash.    Neurological: Negative for dizziness. Psychiatric/Behavioral: Negative for depression     Past Medical History:  Diagnosis Date  . Alcohol abuse   . Chicken pox   . Colon polyps   . Depression   . Hernia of abdominal cavity   . Hip fracture ([place]) right  . Neuropathy ([place])    No past surgical history on file. Social History:   reports that she has been smoking Cigarettes.  She has been smoking about 0.15 packs per day. She has never used smokeless tobacco. She reports that she drinks about 12.6 oz of alcohol per week . She reports that she does not use drugs.  Family History  Problem Relation Age of Onset  . Hypertension Mother   . Cancer Father 40    prostate    Medications:   Medication List       Accurate as of 09/19/15  9:07 AM. Always use your most recent med list.          alum & mag hydroxide-simeth 200-200-20 MG/5ML suspension Commonly known as:  MAALOX/MYLANTA Take 30 mLs by mouth every 6 (six) hours as needed for indigestion or heartburn.   cetirizine 10 MG tablet Commonly known as:  ZYRTEC Take 10 mg by mouth daily.   cloNIDine 0.1 mg/24hr patch Commonly known as:  CATAPRES - Dosed in mg/24 hr Place 0.1 mg onto the skin once a week. Placed 09/08/15   dicyclomine 20 MG tablet Commonly known as:  BENTYL Take 20 mg by mouth every 6 (six) hours as needed for spasms.   levofloxacin 750 MG tablet Commonly known as:  LEVAQUIN Take 1 tablet (750 mg total) by mouth daily.   mirtazapine 15 MG tablet Commonly  known as:  REMERON Take 1 tablet (15 mg total) by mouth at bedtime.   omeprazole 40 MG capsule Commonly known as:  PRILOSEC Take 1 capsule (40 mg total) by mouth 2 (two) times daily.   ondansetron 8 MG tablet Commonly known as:  ZOFRAN Take 8 mg by mouth 2 (two) times daily as needed for nausea or vomiting.   potassium chloride SA 20 MEQ tablet Commonly known as:  KLOR-CON M20 Take 1 tablet (20 mEq total) by mouth 2 (two) times daily.    pregabalin 50 MG capsule Commonly known as:  LYRICA Take 1 capsule (50 mg total) by mouth 3 (three) times daily.   QUEtiapine 50 MG tablet Commonly known as:  SEROQUEL Take 50 mg by mouth at bedtime.   thiamine 100 MG tablet Take 1 tablet (100 mg total) by mouth daily.   traMADol 50 MG tablet Commonly known as:  ULTRAM Take 50 mg by mouth 2 (two) times daily as needed (Toe pain).       Immunizations: Immunization History  Administered Date(s) Administered  . Hep A / Hep B 10/11/2014, 11/14/2014, 04/22/2015  . PPD Test 09/16/2015     Physical Exam:Body mass index is 20.22 kg/m.  BP 116/82   Pulse 93   Temp 98.1 F (36.7 C) (Oral)   Resp 18   Ht 5\' 1"  (1.549 m)   Wt 107 lb (48.5 kg)   SpO2 96%   BMI 20.22 kg/m   General- elderly female, well built, in no acute distress Head- normocephalic, atraumatic Nose- no nasal discharge Throat- moist mucus membrane Eyes- PERRLA, EOMI, no pallor, no icterus, no discharge, normal conjunctiva, normal sclera Neck- no cervical lymphadenopathy Cardiovascular- normal s1,s2, no murmur Respiratory- bilateral clear to auscultation, no wheeze, no rhonchi, no crackles, no use of accessory muscles Abdomen- bowel sounds present, soft, non tender Musculoskeletal- able to move all 4 extremities, generalized weakness, cast to her left ankle, able to move her toes and good capillary refill Neurological- alert and oriented to person, place and time Skin- warm and dry Psychiatry- normal mood and affect    Labs reviewed: Basic Metabolic Panel:  Recent Labs  09/10/15 0954 09/10/15 1648 09/11/15 0512  09/14/15 0532 09/15/15 0951 09/16/15 0455  NA 127* 136 135  < > 129* 135 138  K 2.0* 2.5* 3.4*  < > 4.3 3.6 3.7  CL 89* 101 106  < > 96* 102 108  CO2 26 26 24   < > 26 25 23   GLUCOSE 117* 132* 95  < > 113* 158* 105*  BUN 6 <5* <5*  < > 8 <5* <5*  CREATININE 0.82 0.58 0.40*  < > 0.79 0.53 0.49  CALCIUM 8.6* 8.6* 8.0*  < > 9.1 8.5*  8.6*  MG 0.8* 3.4* 1.7  --   --   --   --   < > = values in this interval not displayed. Liver Function Tests:  Recent Labs  08/24/15 1707 09/05/15 0825 09/10/15 0954  AST 98* 57* 49*  ALT 20 22 21   ALKPHOS 186* 125* 105  BILITOT 0.8 0.8 0.8  PROT 8.1 7.1 6.8  ALBUMIN 3.4* 3.3* 3.0*   No results for input(s): LIPASE, AMYLASE in the last 8760 hours.  Recent Labs  09/14/15 1602  AMMONIA 17   CBC:  Recent Labs  07/31/15 1454  09/05/15 0825 09/10/15 0954  09/13/15 0617 09/14/15 0532 09/16/15 0455  WBC 9.5  < > 9.2 19.8*  < > 13.3* 18.5* 9.5  NEUTROABS 4.8  --  5.8 17.1*  --   --   --   --   HGB 14.4  < > 13.4 12.2  < > 12.5 12.3 10.7*  HCT 42.2  < > 39.9 34.0*  < > 35.4* 35.9* 32.2*  MCV 110.5*  < > 110.6 Repeated and verified X2.* 102.7*  < > 105.0* 105.9* 108.1*  PLT 314.0  < > 237.0 290  < > 418* 473* 503*  < > = values in this interval not displayed. Cardiac Enzymes:  Recent Labs  08/24/15 1707 09/02/15 2112  TROPONINI <0.03 <0.03   BNP: Invalid input(s): POCBNP CBG: No results for input(s): GLUCAP in the last 8760 hours.  Radiological Exams: Dg Chest 2 View  Result Date: 09/10/2015 CLINICAL DATA:  Cough for 1 day EXAM: CHEST  2 VIEW COMPARISON:  August 24, 2015 FINDINGS: There is mild generalized interstitial thickening without frank edema or consolidation. Heart size and pulmonary vascularity are normal. No adenopathy. There is a focal hiatal hernia. No bone lesions. IMPRESSION: Focal hiatal hernia. Interstitium shows mild generalized thickening without frank edema or consolidation. This appearance is stable compared to recent prior study. Electronically Signed   By: Lowella Grip III M.D.   On: 09/10/2015 16:01   Dg Chest 2 View  Result Date: 08/24/2015 CLINICAL DATA:  Chest pain EXAM: CHEST  2 VIEW COMPARISON:  11/23/2013 FINDINGS: Heart size and vascularity normal. Lungs are clear without infiltrate or effusion. Moderate large hiatal hernia. Negative  for mass or adenopathy IMPRESSION: Hiatal hernia.  No acute cardiopulmonary abnormality. Electronically Signed   By: Franchot Gallo M.D.   On: 08/24/2015 17:23   Dg Ankle Complete Left  Result Date: 09/10/2015 CLINICAL DATA:  54 year old female fell last night with left ankle injury. Anterior lateral pain. Swelling. Initial encounter. EXAM: LEFT ANKLE COMPLETE - 3+ VIEW COMPARISON:  None. FINDINGS: Oblique slightly displaced fracture left lateral malleolus with adjacent soft tissue swelling. No other fracture noted. Ankle mortise appears intact. Plantar spur. IMPRESSION: Oblique slightly displaced fracture left lateral malleolus with adjacent soft tissue swelling. Electronically Signed   By: Genia Del M.D.   On: 09/10/2015 10:09   Ct Head Wo Contrast  Result Date: 09/10/2015 CLINICAL DATA:  Fall EXAM: CT HEAD WITHOUT CONTRAST TECHNIQUE: Contiguous axial images were obtained from the base of the skull through the vertex without intravenous contrast. COMPARISON:  None. FINDINGS: No skull fracture is noted. Paranasal sinuses and mastoid air cells are unremarkable. No intracranial hemorrhage, mass effect or midline shift. Mild cerebral atrophy. No acute cortical infarction. No mass lesion is noted on this unenhanced scan. No hydrocephalus. No intra or extra-axial fluid collection. IMPRESSION: No acute intracranial abnormality.  Mild cerebral atrophy. Electronically Signed   By: Lahoma Crocker M.D.   On: 09/10/2015 12:51   Dg Chest Port 1 View  Result Date: 09/13/2015 CLINICAL DATA:  Fever with productive cough for 3 days.  Smoker. EXAM: PORTABLE CHEST 1 VIEW COMPARISON:  09/10/2015 FINDINGS: Hiatal hernia is unchanged. Cardiac silhouette is within normal limits for size. Diffuse interstitial prominence has mildly increased from the prior study, and there is now asymmetric hazy density in the left perihilar region. No pleural effusion or pneumothorax is identified. No acute osseous abnormality. IMPRESSION:  Increasing interstitial densities with new left perihilar opacity concerning for pneumonia. Electronically Signed   By: Logan Bores M.D.   On: 09/13/2015 23:36    Assessment/Plan  Generalized weakness Will have patient work with PT/OT as tolerated  to regain strength and restore function.  Fall precautions are in place.  Alcohol use With recent hospital admission for alcohol withdrawal requiring librium. Will need to go through rehabilitation program to stop alcohol use. Continue thiamine supplement  Left ankle fracture S/p surgical repair. Has orthopedic follow up. Will have her work with physical therapy and occupational therapy team to help with gait training and muscle strengthening exercises.fall precautions. Skin care. Encourage to be out of bed. Start tylenol extra strength 1000 mg bid to help better control her pain. Continue tramadol 50 mg bid prn for now  Aspiration pneumonia Continue levaquin and complete antibiotic on 09/21/15.   Alcohol induced Mood disorder Continue seroqel 50 mg daily and remeron 15 mg daily  Anemia of chronic disease With alcohol use. 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

## 2015-09-23 ENCOUNTER — Non-Acute Institutional Stay (SKILLED_NURSING_FACILITY): Payer: BLUE CROSS/BLUE SHIELD | Admitting: Family

## 2015-09-23 ENCOUNTER — Encounter: Payer: Self-pay | Admitting: Family

## 2015-09-23 DIAGNOSIS — S82892D Other fracture of left lower leg, subsequent encounter for closed fracture with routine healing: Secondary | ICD-10-CM | POA: Diagnosis not present

## 2015-09-23 DIAGNOSIS — R2681 Unsteadiness on feet: Secondary | ICD-10-CM | POA: Diagnosis not present

## 2015-09-23 DIAGNOSIS — F1094 Alcohol use, unspecified with alcohol-induced mood disorder: Secondary | ICD-10-CM

## 2015-09-23 DIAGNOSIS — E876 Hypokalemia: Secondary | ICD-10-CM

## 2015-09-23 DIAGNOSIS — G629 Polyneuropathy, unspecified: Secondary | ICD-10-CM

## 2015-09-23 NOTE — Progress Notes (Signed)
Patient ID: [name], female   DOB: April 17, 1960, 55 y.o.   MRN: [id number]   Location:  [facility] Room Number: 208 Place of Service:  SNF (31) Provider:  Marlowe Sax, FNP-C  Crecencio Mc, MD  Patient Care Team: Crecencio Mc, MD as PCP - General (Internal Medicine)  Extended Emergency Contact Information Primary Emergency Contact: Philomena Course States of [place] Mobile Phone: [phone] Relation: Daughter Secondary Emergency Contact: King,Roger  [place] Phone: [phone] Relation: Father Mother: Alvia Grove States of [place] Phone: [phone]  Code Status:  Full Code Goals of care: Advanced Directive information Advanced Directives 09/10/2015  Does patient have an advance directive? No  Would patient like information on creating an advanced directive? No - patient declined information     Chief Complaint  Patient presents with  . Discharge Note    HPI:  Pt is a 55 y.o. female seen today at [facility] for discharge home. She was here for short term rehabilitation post hospital admission from 09/10/15-09/16/15 with AMS with alcohol withdrawal and aspiration pneumonia. She required librium and was started on antibiotics. She had hyponatremia and hyperkalemia that resolved with iv fluids. Of note, she had recent closed left ankle fracture following up with Ortho. She complains of left ankle pain and chronic neuropathic pain on the feet and hands. She has worked with PT/OT now stable for discharge home. She will be discharged home with Home health PT/OT to continue with ROM, Exercise, Gait stability and muscle strengthening. She will  Require  a FWW with left leg swing rest to allow her to maintain current level of independence with ADL's.Physical Therapy recommended a standard WC but she states WC will not fit  her mothers house. Home health services will be arranged by  facility social worker prior to discharge.She will be discharged with her meds from the facility.  Prescription medication will be written x 1 month then patient to follow up with PCP in 1-2 weeks.Facility staff report no new concerns.    Past Medical History:  Diagnosis Date  . Alcohol abuse   . Chicken pox   . Colon polyps   . Depression   . Hernia of abdominal cavity   . Hip fracture ([place]) right  . Neuropathy ([place])    History reviewed. No pertinent surgical history.  Allergies  Allergen Reactions  . Aspirin Swelling    unknown      Medication List       Accurate as of 09/23/15 11:59 PM. Always use your most recent med list.          acetaminophen 500 MG tablet Commonly known as:  TYLENOL Take 1,000 mg by mouth 2 (two) times daily.   alum & mag hydroxide-simeth 200-200-20 MG/5ML suspension Commonly known as:  MAALOX/MYLANTA Take 30 mLs by mouth every 6 (six) hours as needed for indigestion or heartburn.   cetirizine 10 MG tablet Commonly known as:  ZYRTEC Take 10 mg by mouth daily.   cloNIDine 0.1 mg/24hr patch Commonly known as:  CATAPRES - Dosed in mg/24 hr Place 0.1 mg onto the skin once a week. Placed 09/08/15   dicyclomine 20 MG tablet Commonly known as:  BENTYL Take 20 mg by mouth every 6 (six) hours as needed for spasms.   mirtazapine 15 MG tablet Commonly known as:  REMERON Take 1 tablet (15 mg total) by mouth at bedtime.   omeprazole 40  MG capsule Commonly known as:  PRILOSEC Take 1 capsule (40 mg total) by mouth 2 (two) times daily.   ondansetron 8 MG tablet Commonly known as:  ZOFRAN Take 8 mg by mouth 2 (two) times daily as needed for nausea or vomiting.   potassium chloride SA 20 MEQ tablet Commonly known as:  KLOR-CON M20 Take 1 tablet (20 mEq total) by mouth 2 (two) times daily.   pregabalin 50 MG capsule Commonly known as:  LYRICA Take 1 capsule (50 mg total) by mouth 3 (three) times daily.   QUEtiapine 50 MG tablet Commonly known  as:  SEROQUEL Take 50 mg by mouth at bedtime.   thiamine 100 MG tablet Take 1 tablet (100 mg total) by mouth daily.   traMADol 50 MG tablet Commonly known as:  ULTRAM Take 50 mg by mouth 2 (two) times daily as needed (Toe pain).       Review of Systems  Constitutional: Negative for activity change, appetite change, chills, fatigue and fever.  HENT: Negative for congestion, rhinorrhea, sinus pressure, sneezing and sore throat.   Eyes: Negative.   Respiratory: Negative for cough, chest tightness, shortness of breath and wheezing.   Cardiovascular: Negative for chest pain, palpitations and leg swelling.  Gastrointestinal: Negative for abdominal distention, abdominal pain, constipation, diarrhea, nausea and vomiting.  Endocrine: Negative.   Genitourinary: Negative for dysuria, flank pain, frequency and urgency.  Musculoskeletal: Positive for gait problem.       Left ankle pain   Skin: Negative for color change, pallor and rash.       Left leg cast   Neurological: Negative for dizziness, seizures, syncope, light-headedness and headaches.  Hematological: Does not bruise/bleed easily.  Psychiatric/Behavioral: Negative for agitation, confusion, hallucinations and sleep disturbance. The patient is not nervous/anxious.     Immunization History  Administered Date(s) Administered  . Hep A / Hep B 10/11/2014, 11/14/2014, 04/22/2015  . PPD Test 09/16/2015   Pertinent  Health Maintenance Due  Topic Date Due  . COLONOSCOPY  12/26/2011  . MAMMOGRAM  02/04/2015  . INFLUENZA VACCINE  02/22/2017 (Originally 09/24/2015)  . PAP SMEAR  04/21/2018   No flowsheet data found. Functional Status Survey:    Vitals:   09/23/15 1000  BP: 138/72  Pulse: 79  Resp: 20  Temp: 98.5 F (36.9 C)  TempSrc: Oral  SpO2: 97%  Weight: 106 lb (48.1 kg)  Height: 5\' 1"  (1.549 m)   Body mass index is 20.03 kg/m. Physical Exam  Constitutional: She is oriented to person, place, and time. She appears  well-developed and well-nourished. No distress.  HENT:  Head: Normocephalic.  Mouth/Throat: Oropharynx is clear and moist. No oropharyngeal exudate.  Eyes: Conjunctivae and EOM are normal. Pupils are equal, round, and reactive to light. Right eye exhibits no discharge. Left eye exhibits no discharge. No scleral icterus.  Neck: Normal range of motion. No JVD present. No thyromegaly present.  Cardiovascular: Normal rate, regular rhythm, normal heart sounds and intact distal pulses.  Exam reveals no gallop and no friction rub.   No murmur heard. Pulmonary/Chest: Effort normal and breath sounds normal. No respiratory distress. She has no wheezes. She has no rales.  Abdominal: Soft. Bowel sounds are normal. She exhibits no distension. There is no tenderness. There is no rebound and no guarding.  Musculoskeletal: She exhibits no edema, tenderness or deformity.  Unsteady gait. Left leg cast.   Lymphadenopathy:    She has no cervical adenopathy.  Neurological: She is oriented to person, place,  and time.  Skin: Skin is warm and dry. No rash noted. No erythema. No pallor.  Psychiatric: She has a normal mood and affect.    Labs reviewed:  Recent Labs  09/10/15 0954 09/10/15 1648 09/11/15 0512  09/14/15 0532 09/15/15 0951 09/16/15 0455  NA 127* 136 135  < > 129* 135 138  K 2.0* 2.5* 3.4*  < > 4.3 3.6 3.7  CL 89* 101 106  < > 96* 102 108  CO2 26 26 24   < > 26 25 23   GLUCOSE 117* 132* 95  < > 113* 158* 105*  BUN 6 <5* <5*  < > 8 <5* <5*  CREATININE 0.82 0.58 0.40*  < > 0.79 0.53 0.49  CALCIUM 8.6* 8.6* 8.0*  < > 9.1 8.5* 8.6*  MG 0.8* 3.4* 1.7  --   --   --   --   < > = values in this interval not displayed.  Recent Labs  08/24/15 1707 09/05/15 0825 09/10/15 0954  AST 98* 57* 49*  ALT 20 22 21   ALKPHOS 186* 125* 105  BILITOT 0.8 0.8 0.8  PROT 8.1 7.1 6.8  ALBUMIN 3.4* 3.3* 3.0*    Recent Labs  07/31/15 1454  09/05/15 0825 09/10/15 0954  09/13/15 0617 09/14/15 0532  09/16/15 0455  WBC 9.5  < > 9.2 19.8*  < > 13.3* 18.5* 9.5  NEUTROABS 4.8  --  5.8 17.1*  --   --   --   --   HGB 14.4  < > 13.4 12.2  < > 12.5 12.3 10.7*  HCT 42.2  < > 39.9 34.0*  < > 35.4* 35.9* 32.2*  MCV 110.5*  < > 110.6 Repeated and verified X2.* 102.7*  < > 105.0* 105.9* 108.1*  PLT 314.0  < > 237.0 290  < > 418* 473* 503*  < > = values in this interval not displayed. Lab Results  Component Value Date   TSH 1.41 10/11/2014   Lab Results  Component Value Date   HGBA1C 5.3 04/22/2015   Lab Results  Component Value Date   CHOL 193 10/11/2014   HDL 45.30 10/11/2014   LDLDIRECT 119.0 10/11/2014   TRIG 282.0 (H) 10/11/2014   CHOLHDL 4 10/11/2014    Significant Diagnostic Results in last 30 days:  Dg Chest 2 View  Result Date: 09/10/2015 CLINICAL DATA:  Cough for 1 day EXAM: CHEST  2 VIEW COMPARISON:  August 24, 2015 FINDINGS: There is mild generalized interstitial thickening without frank edema or consolidation. Heart size and pulmonary vascularity are normal. No adenopathy. There is a focal hiatal hernia. No bone lesions. IMPRESSION: Focal hiatal hernia. Interstitium shows mild generalized thickening without frank edema or consolidation. This appearance is stable compared to recent prior study. Electronically Signed   By: Lowella Grip III M.D.   On: 09/10/2015 16:01   Dg Ankle Complete Left  Result Date: 09/10/2015 CLINICAL DATA:  55 year old female fell last night with left ankle injury. Anterior lateral pain. Swelling. Initial encounter. EXAM: LEFT ANKLE COMPLETE - 3+ VIEW COMPARISON:  None. FINDINGS: Oblique slightly displaced fracture left lateral malleolus with adjacent soft tissue swelling. No other fracture noted. Ankle mortise appears intact. Plantar spur. IMPRESSION: Oblique slightly displaced fracture left lateral malleolus with adjacent soft tissue swelling. Electronically Signed   By: Genia Del M.D.   On: 09/10/2015 10:09   Ct Head Wo Contrast  Result Date:  09/10/2015 CLINICAL DATA:  Fall EXAM: CT HEAD WITHOUT CONTRAST TECHNIQUE: Contiguous axial  images were obtained from the base of the skull through the vertex without intravenous contrast. COMPARISON:  None. FINDINGS: No skull fracture is noted. Paranasal sinuses and mastoid air cells are unremarkable. No intracranial hemorrhage, mass effect or midline shift. Mild cerebral atrophy. No acute cortical infarction. No mass lesion is noted on this unenhanced scan. No hydrocephalus. No intra or extra-axial fluid collection. IMPRESSION: No acute intracranial abnormality.  Mild cerebral atrophy. Electronically Signed   By: Lahoma Crocker M.D.   On: 09/10/2015 12:51   Dg Chest Port 1 View  Result Date: 09/13/2015 CLINICAL DATA:  Fever with productive cough for 3 days.  Smoker. EXAM: PORTABLE CHEST 1 VIEW COMPARISON:  09/10/2015 FINDINGS: Hiatal hernia is unchanged. Cardiac silhouette is within normal limits for size. Diffuse interstitial prominence has mildly increased from the prior study, and there is now asymmetric hazy density in the left perihilar region. No pleural effusion or pneumothorax is identified. No acute osseous abnormality. IMPRESSION: Increasing interstitial densities with new left perihilar opacity concerning for pneumonia. Electronically Signed   By: Logan Bores M.D.   On: 09/13/2015 23:36    Assessment/Plan ETOH mood disorder  Stable.continue on seroquel 50 mg Tablet. Avoid Benzodiazepines. Continue to monitor for mood changes.    Neuropathy Worst on hands and feet. Continue on Lyrica 50 mg Capsule.  Closed left Ankle Fracture  Cast in place. Continue with PT/OT for continue with ROM, Exercise, Gait stability and muscle strengthening.Ordering FWW with left leg swing rest to allow her to maintain current level of independence with ADL's.Physical Therapy recommended a standard WC but she states WC will not fit  her mothers house.Continue to follow up with Ortho as directed. Continue on Tramadol  for pain.    Hypokalemia  Continue on Potassium chloride 20 meg twice daily. BMP in 1-2 weeks with PCP  Unsteady gait   Continue with PT/OT for continue with ROM, Exercise, Gait stability and muscle strengthening.Fall and safety precautions.  Family/ staff Communication:Reviewed plan of care with patient and facility Nurse supervisor.   Labs/tests ordered:  CBC, BMP in 1-2 weeks with PCP

## 2015-10-11 ENCOUNTER — Telehealth: Payer: Self-pay | Admitting: *Deleted

## 2015-10-11 NOTE — Telephone Encounter (Signed)
Waldon Reining from [facility] is calling to get verbal orders on the frequency for physical therapy. The order is twice a week for 2 weeks. She is requesting a call back. Her call back number is [phone]. Please advise provider.Thanks

## 2015-10-11 NOTE — Telephone Encounter (Signed)
Left a message advising of below-aa

## 2015-10-11 NOTE — Telephone Encounter (Signed)
Yes, please give a verbal order the PT  To be done 2/week x 2 weeks

## 2015-10-11 NOTE — Telephone Encounter (Signed)
Please ask Dr.Tullo if a verbal can be given.

## 2015-10-11 NOTE — Telephone Encounter (Signed)
Please review-aa 

## 2015-10-17 ENCOUNTER — Encounter: Payer: Self-pay | Admitting: Family

## 2015-10-17 ENCOUNTER — Ambulatory Visit (INDEPENDENT_AMBULATORY_CARE_PROVIDER_SITE_OTHER): Payer: BLUE CROSS/BLUE SHIELD | Admitting: Family

## 2015-10-17 ENCOUNTER — Other Ambulatory Visit: Payer: Self-pay

## 2015-10-17 VITALS — BP 100/76 | HR 98 | Temp 97.8°F | Resp 16 | Wt 101.0 lb

## 2015-10-17 DIAGNOSIS — I471 Supraventricular tachycardia: Secondary | ICD-10-CM | POA: Diagnosis not present

## 2015-10-17 DIAGNOSIS — F1020 Alcohol dependence, uncomplicated: Secondary | ICD-10-CM

## 2015-10-17 DIAGNOSIS — R3989 Other symptoms and signs involving the genitourinary system: Secondary | ICD-10-CM | POA: Diagnosis not present

## 2015-10-17 DIAGNOSIS — E538 Deficiency of other specified B group vitamins: Secondary | ICD-10-CM

## 2015-10-17 DIAGNOSIS — S82892D Other fracture of left lower leg, subsequent encounter for closed fracture with routine healing: Secondary | ICD-10-CM

## 2015-10-17 LAB — POCT URINALYSIS DIPSTICK
Bilirubin, UA: NEGATIVE
Blood, UA: NEGATIVE
Glucose, UA: NEGATIVE
Ketones, UA: NEGATIVE
Nitrite, UA: NEGATIVE
Protein, UA: NEGATIVE
Spec Grav, UA: 1.01
Urobilinogen, UA: 0.2
pH, UA: 5

## 2015-10-17 MED ORDER — QUETIAPINE FUMARATE 50 MG PO TABS: 50.0000 mg | ORAL_TABLET | Freq: Every day | ORAL | 1 refills | Status: DC

## 2015-10-17 MED ORDER — PREGABALIN 50 MG PO CAPS: 50.0000 mg | ORAL_CAPSULE | Freq: Three times a day (TID) | ORAL | 5 refills | Status: DC

## 2015-10-17 MED ORDER — CYANOCOBALAMIN 1000 MCG/ML IJ SOLN
1000.0000 ug | Freq: Once | INTRAMUSCULAR | Status: DC
  Administered 2015-10-17: 1000 ug via INTRAMUSCULAR

## 2015-10-17 NOTE — Assessment & Plan Note (Signed)
Stable.  Improving.

## 2015-10-17 NOTE — Assessment & Plan Note (Signed)
HR WNL today. Tolerating clonidine patch well. No side effects. Discussed side effects with patient and advised if she experiences any of these during rehab or afterwards to let PCP know.

## 2015-10-17 NOTE — Patient Instructions (Signed)
Pleasure meeting you.   You may stay on clonidine as long as you are not symptomatic- lightheaded, dizzy, extreme fatigue. If so, please call Dr. Derrel Nip :)

## 2015-10-17 NOTE — Telephone Encounter (Signed)
Please call in the Lyrica since I won't be in tomorrow to sign

## 2015-10-17 NOTE — Progress Notes (Signed)
Subjective:    Patient ID: Kathy [name], female    DOB: 1960/08/09, 55 y.o.   MRN: [id number]  CC: Kathy [name] is a 55 y.o. female who presents today for an acute visit.    HPI: She is here for follow up before going to [facility] this weekend for alcohol rehab. She also needs b12 injection -last B12 09/2014 was 360. Last injection 06/2015.  HTN: Has clonidine patch on and feels like it make her hands and feet numb. Referred to [facility] overnight which she went to PT for left ankle. HR was running into 120's per patient after exercises. She was not placed on patch for alcohol withdrawal. No tremors, excessive sweating. Denies exertional chest pain or pressure, numbness or tingling radiating to left arm or jaw, palpitations, dizziness, frequent headaches, changes in vision, or shortness of breath.   Alcohol: Hasn't had drink 6 weeks.  Plans to go to [facility] this weekend as told she go for 28 days for alcohol rehab.   Left Foot: Fell into a hole during 3rd day of rehab at [place] one month ago and broke left ankle. No surgery, wearing boot. Following orthopedic, scheduled again next week.       HISTORY:  Past Medical History:  Diagnosis Date  . Alcohol abuse   . Chicken pox   . Colon polyps   . Depression   . Hernia of abdominal cavity   . Hip fracture ([place]) right  . Neuropathy ([place])    History reviewed. No pertinent surgical history. Family History  Problem Relation Age of Onset  . Hypertension Mother   . Cancer Father 66    prostate    Allergies: Aspirin Current Outpatient Prescriptions on File Prior to Visit  Medication Sig Dispense Refill  . alum & mag hydroxide-simeth (MAALOX/MYLANTA) 200-200-20 MG/5ML suspension Take 30 mLs by mouth every 6 (six) hours as needed for indigestion or heartburn.     . cetirizine (ZYRTEC) 10 MG tablet Take 10 mg by mouth daily.    . cloNIDine (CATAPRES - DOSED IN MG/24 HR) 0.1 mg/24hr patch Place 0.1  mg onto the skin once a week. Placed 09/08/15    . dicyclomine (BENTYL) 20 MG tablet Take 20 mg by mouth every 6 (six) hours as needed for spasms.    . mirtazapine (REMERON) 15 MG tablet Take 1 tablet (15 mg total) by mouth at bedtime. 30 tablet 5  . omeprazole (PRILOSEC) 40 MG capsule Take 1 capsule (40 mg total) by mouth 2 (two) times daily. 180 capsule 1  . ondansetron (ZOFRAN) 8 MG tablet Take 8 mg by mouth 2 (two) times daily as needed for nausea or vomiting.    . potassium chloride SA (KLOR-CON M20) 20 MEQ tablet Take 1 tablet (20 mEq total) by mouth 2 (two) times daily. 14 tablet 0  . pregabalin (LYRICA) 50 MG capsule Take 1 capsule (50 mg total) by mouth 3 (three) times daily. 90 capsule 5  . QUEtiapine (SEROQUEL) 50 MG tablet Take 50 mg by mouth at bedtime.    . thiamine 100 MG tablet Take 1 tablet (100 mg total) by mouth daily.    . traMADol (ULTRAM) 50 MG tablet Take 50 mg by mouth 2 (two) times daily as needed (Toe pain).     No current facility-administered medications on file prior to visit.     Social History  Substance Use Topics  . Smoking status: Current Every Day Smoker    Packs/day:  0.15    Types: Cigarettes  . Smokeless tobacco: Never Used     Comment: smokes one pack/week  . Alcohol use 12.6 oz/week    21 Standard drinks or equivalent per week     Comment: Patient states she is cutting back    Review of Systems  Constitutional: Negative for chills and fever.  Eyes: Negative for visual disturbance.  Respiratory: Negative for cough, shortness of breath and wheezing.   Cardiovascular: Negative for chest pain, palpitations and leg swelling.  Gastrointestinal: Negative for nausea and vomiting.  Neurological: Negative for headaches.      Objective:    BP 100/76 (BP Location: Left Arm, Patient Position: Sitting, Cuff Size: Normal)   Pulse 98   Temp 97.8 F (36.6 C) (Oral)   Resp 16   Wt 101 lb (45.8 kg)   SpO2 98%   BMI 19.08 kg/m    Physical Exam    Constitutional: She appears well-developed and well-nourished.  Eyes: Conjunctivae are normal.  Cardiovascular: Normal rate, regular rhythm, normal heart sounds and normal pulses.   Pulmonary/Chest: Effort normal and breath sounds normal. She has no wheezes. She has no rhonchi. She has no rales.  Neurological: She is alert.  Skin: Skin is warm and dry.  Psychiatric: She has a normal mood and affect. Her speech is normal and behavior is normal. Thought content normal.  Vitals reviewed.      Assessment & Plan:   Problem List Items Addressed This Visit      Cardiovascular and Mediastinum   Paroxysmal supraventricular tachycardia (HCC)    HR WNL today. Tolerating clonidine patch well. No side effects. Discussed side effects with patient and advised if she experiences any of these during rehab or afterwards to let PCP know.         Musculoskeletal and Integument   Closed left ankle fracture    Stable. Improving.        Genitourinary   Urinary problem - Primary    Patient has no symptoms today and asked for urine to be rechecked as she was told she had a urinary tract infection when she was in [place] for her ankle injurt. Per review, 08/2015 culture was not diagnostic for UTI. Rechecking today.       Relevant Orders   POCT urinalysis dipstick (Completed)   CULTURE, URINE COMPREHENSIVE     Other   Alcohol dependence ([place])    Sober. No withdrawal symptoms today. Congratulated patient on going to alcohol rehabilitation this weekend.       Other Visit Diagnoses    Vitamin B 12 deficiency       Relevant Orders   PR VITAMIN B12 INJECTION        I am having Ms. Stinner maintain her omeprazole, mirtazapine, potassium chloride SA, dicyclomine, cloNIDine, QUEtiapine, alum & mag hydroxide-simeth, cetirizine, ondansetron, thiamine, pregabalin, and traMADol. We administered cyanocobalamin.   Meds ordered this encounter  Medications  . DISCONTD: cyanocobalamin ((VITAMIN  B-12)) injection 1,000 mcg    Return precautions given.   Risks, benefits, and alternatives of the medications and treatment plan prescribed today were discussed, and patient expressed understanding.   Education regarding symptom management and diagnosis given to patient on AVS.  Continue to follow with TULLO, Aris Everts, MD for routine health maintenance.   Kemper and I agreed with plan.   Mable Paris, FNP

## 2015-10-17 NOTE — Assessment & Plan Note (Signed)
Patient has no symptoms today and asked for urine to be rechecked as she was told she had a urinary tract infection when she was in [place] for her ankle injurt. Per review, 08/2015 culture was not diagnostic for UTI. Rechecking today.

## 2015-10-17 NOTE — Assessment & Plan Note (Signed)
Sober. No withdrawal symptoms today. Congratulated patient on going to alcohol rehabilitation this weekend.

## 2015-10-18 ENCOUNTER — Other Ambulatory Visit: Payer: Self-pay | Admitting: Internal Medicine

## 2015-10-18 ENCOUNTER — Telehealth: Payer: Self-pay | Admitting: *Deleted

## 2015-10-18 NOTE — Telephone Encounter (Signed)
Patient requested lab results  Pt contact [phone]

## 2015-10-18 NOTE — Telephone Encounter (Signed)
Patient was informed they have not been interpreted

## 2015-10-21 LAB — CULTURE, URINE COMPREHENSIVE

## 2015-11-14 ENCOUNTER — Telehealth: Payer: Self-pay | Admitting: *Deleted

## 2015-11-14 DIAGNOSIS — G629 Polyneuropathy, unspecified: Secondary | ICD-10-CM

## 2015-11-14 NOTE — Telephone Encounter (Signed)
Please advise for referral, thanks 

## 2015-11-14 NOTE — Telephone Encounter (Signed)
Referrals require a reason.  Please ask patient

## 2015-11-14 NOTE — Telephone Encounter (Signed)
Patient has requested to have a referral to [facility] to see Dr. Lelan Pons  contact [phone] King-mother  Pt currently in rehab, will release next Thursday

## 2015-11-15 NOTE — Telephone Encounter (Signed)
Attempted to reach Kathy [name], Patient's mother to get clarity on why she needs the referral. thanks

## 2015-11-15 NOTE — Telephone Encounter (Signed)
Your referral is in process as requested. Our referral coordinator will call you when the appointment has been made.  

## 2015-11-15 NOTE — Telephone Encounter (Signed)
Noted, thanks!

## 2015-11-15 NOTE — Telephone Encounter (Signed)
The patient is needing a referral to neurology [facility] with Dr. Melrose Nakayama for neuropathy. The patient is at [facility] a rehab center in [place] she will be released on next Thursday September 28,2017.

## 2015-11-15 NOTE — Telephone Encounter (Signed)
Please advise 

## 2015-11-22 ENCOUNTER — Telehealth: Payer: Self-pay | Admitting: Internal Medicine

## 2015-11-22 MED ORDER — DICYCLOMINE HCL 20 MG PO TABS: 20.0000 mg | ORAL_TABLET | Freq: Four times a day (QID) | ORAL | 3 refills | Status: DC | PRN

## 2015-11-22 NOTE — Telephone Encounter (Signed)
Pt called and stated that she needs a refill on her dicyclomine (BENTYL) 20 MG tablet and the pharmacy called her to tell her that there is no more refills. She has no more medication. Thank you!  Call pt @ [place], [place]

## 2015-11-22 NOTE — Telephone Encounter (Signed)
Refill sent.

## 2015-11-28 ENCOUNTER — Ambulatory Visit (INDEPENDENT_AMBULATORY_CARE_PROVIDER_SITE_OTHER): Payer: BLUE CROSS/BLUE SHIELD

## 2015-11-28 DIAGNOSIS — E538 Deficiency of other specified B group vitamins: Secondary | ICD-10-CM

## 2015-11-28 MED ORDER — CYANOCOBALAMIN 1000 MCG/ML IJ SOLN
1000.0000 ug | Freq: Once | INTRAMUSCULAR | Status: AC
  Administered 2015-11-28: 1000 ug via INTRAMUSCULAR

## 2015-11-28 NOTE — Progress Notes (Signed)
Patient came in for B12 injection.  Received in Left arm.  Patient tolerated well.

## 2015-12-03 ENCOUNTER — Telehealth: Payer: Self-pay | Admitting: *Deleted

## 2015-12-03 NOTE — Telephone Encounter (Signed)
Sublingual is fine available OTC

## 2015-12-03 NOTE — Telephone Encounter (Signed)
Clarene Reamer message for patient to call office.

## 2015-12-03 NOTE — Telephone Encounter (Signed)
Pt questioned if she had a order for the B12/injection  Pt contact [phone]

## 2015-12-03 NOTE — Telephone Encounter (Signed)
Patient B 12 checked one year ago lower side of normal at 360, does patient need b 12 injection or can she SL, B 12?

## 2015-12-05 NOTE — Telephone Encounter (Signed)
Spoke with the patient, thanks

## 2015-12-09 ENCOUNTER — Telehealth: Payer: Self-pay | Admitting: Internal Medicine

## 2015-12-09 NOTE — Telephone Encounter (Signed)
Pt called about wanting Dr Derrel Nip to know that she is off the Gabapintin. Pt needs a refill for dicyclomine (BENTYL) 20 MG tablet. 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 Pt has seen a neurologist.   Pharmacy is [place], [place]  Call pt @ [phone]. Thank you!

## 2015-12-09 NOTE — Telephone Encounter (Signed)
Patient has the medication requested.

## 2015-12-13 ENCOUNTER — Telehealth: Payer: Self-pay | Admitting: Internal Medicine

## 2015-12-13 DIAGNOSIS — Z1239 Encounter for other screening for malignant neoplasm of breast: Secondary | ICD-10-CM

## 2015-12-13 DIAGNOSIS — Z780 Asymptomatic menopausal state: Secondary | ICD-10-CM

## 2015-12-13 NOTE — Telephone Encounter (Signed)
Pt called about wanting to get Bone density, mammo and lab work. Need orders please and thank you!  Call pt @ [phone].

## 2015-12-13 NOTE — Telephone Encounter (Signed)
Please advise 

## 2015-12-15 NOTE — Telephone Encounter (Signed)
Needs to be seen for lab work.  DEXA and mammogram ordered.

## 2015-12-18 NOTE — Telephone Encounter (Signed)
Patient has been notified

## 2015-12-19 ENCOUNTER — Other Ambulatory Visit: Payer: Self-pay | Admitting: Internal Medicine

## 2015-12-19 DIAGNOSIS — Z1231 Encounter for screening mammogram for malignant neoplasm of breast: Secondary | ICD-10-CM

## 2015-12-30 ENCOUNTER — Ambulatory Visit (INDEPENDENT_AMBULATORY_CARE_PROVIDER_SITE_OTHER): Payer: BLUE CROSS/BLUE SHIELD | Admitting: Internal Medicine

## 2015-12-30 ENCOUNTER — Encounter: Payer: Self-pay | Admitting: Internal Medicine

## 2015-12-30 VITALS — BP 108/60 | HR 87 | Temp 97.7°F | Resp 12 | Ht 61.0 in | Wt 102.5 lb

## 2015-12-30 DIAGNOSIS — R5383 Other fatigue: Secondary | ICD-10-CM | POA: Diagnosis not present

## 2015-12-30 DIAGNOSIS — G621 Alcoholic polyneuropathy: Secondary | ICD-10-CM

## 2015-12-30 DIAGNOSIS — K760 Fatty (change of) liver, not elsewhere classified: Secondary | ICD-10-CM

## 2015-12-30 DIAGNOSIS — E559 Vitamin D deficiency, unspecified: Secondary | ICD-10-CM | POA: Diagnosis not present

## 2015-12-30 DIAGNOSIS — D751 Secondary polycythemia: Secondary | ICD-10-CM

## 2015-12-30 DIAGNOSIS — E441 Mild protein-calorie malnutrition: Secondary | ICD-10-CM | POA: Diagnosis not present

## 2015-12-30 DIAGNOSIS — E538 Deficiency of other specified B group vitamins: Secondary | ICD-10-CM | POA: Diagnosis not present

## 2015-12-30 DIAGNOSIS — S82892D Other fracture of left lower leg, subsequent encounter for closed fracture with routine healing: Secondary | ICD-10-CM

## 2015-12-30 DIAGNOSIS — E876 Hypokalemia: Secondary | ICD-10-CM

## 2015-12-30 LAB — CBC WITH DIFFERENTIAL/PLATELET
Basophils Absolute: 0 10*3/uL (ref 0.0–0.1)
Basophils Relative: 0.5 % (ref 0.0–3.0)
Eosinophils Absolute: 0.1 10*3/uL (ref 0.0–0.7)
Eosinophils Relative: 1.6 % (ref 0.0–5.0)
HCT: 39.4 % (ref 36.0–46.0)
Hemoglobin: 13.4 g/dL (ref 12.0–15.0)
Lymphocytes Relative: 45.5 % (ref 12.0–46.0)
Lymphs Abs: 4.1 10*3/uL — ABNORMAL HIGH (ref 0.7–4.0)
MCHC: 33.9 g/dL (ref 30.0–36.0)
MCV: 93.1 fl (ref 78.0–100.0)
Monocytes Absolute: 0.6 10*3/uL (ref 0.1–1.0)
Monocytes Relative: 6.2 % (ref 3.0–12.0)
Neutro Abs: 4.2 10*3/uL (ref 1.4–7.7)
Neutrophils Relative %: 46.2 % (ref 43.0–77.0)
Platelets: 244 10*3/uL (ref 150.0–400.0)
RBC: 4.24 Mil/uL (ref 3.87–5.11)
RDW: 13.6 % (ref 11.5–15.5)
WBC: 9.1 10*3/uL (ref 4.0–10.5)

## 2015-12-30 LAB — COMPREHENSIVE METABOLIC PANEL
ALT: 9 U/L (ref 0–35)
AST: 13 U/L (ref 0–37)
Albumin: 4.3 g/dL (ref 3.5–5.2)
Alkaline Phosphatase: 151 U/L — ABNORMAL HIGH (ref 39–117)
BUN: 9 mg/dL (ref 6–23)
CO2: 28 mEq/L (ref 19–32)
Calcium: 10.2 mg/dL (ref 8.4–10.5)
Chloride: 102 mEq/L (ref 96–112)
Creatinine, Ser: 0.49 mg/dL (ref 0.40–1.20)
GFR: 139.87 mL/min (ref >60.00)
Glucose, Bld: 88 mg/dL (ref 70–99)
Potassium: 4.6 mEq/L (ref 3.5–5.1)
Sodium: 137 mEq/L (ref 135–145)
Total Bilirubin: 0.2 mg/dL (ref 0.2–1.2)
Total Protein: 7.1 g/dL (ref 6.0–8.3)

## 2015-12-30 LAB — VITAMIN B12: Vitamin B-12: 250 pg/mL (ref 211–911)

## 2015-12-30 LAB — TSH: TSH: 1.57 u[IU]/mL (ref 0.35–4.50)

## 2015-12-30 LAB — MAGNESIUM: Magnesium: 1.6 mg/dL (ref 1.5–2.5)

## 2015-12-30 LAB — VITAMIN D 25 HYDROXY (VIT D DEFICIENCY, FRACTURES): VITD: 27.24 ng/mL — ABNORMAL LOW (ref 30.00–100.00)

## 2015-12-30 MED ORDER — DICYCLOMINE HCL 20 MG PO TABS: 20.0000 mg | ORAL_TABLET | Freq: Four times a day (QID) | ORAL | 5 refills | Status: DC | PRN

## 2015-12-30 MED ORDER — CYANOCOBALAMIN 1000 MCG/ML IJ SOLN
1000.0000 ug | Freq: Once | INTRAMUSCULAR | Status: AC
  Administered 2015-12-30: 1000 ug via INTRAMUSCULAR

## 2015-12-30 MED ORDER — AMITRIPTYLINE HCL 25 MG PO TABS: 25.0000 mg | ORAL_TABLET | Freq: Every day | ORAL | 1 refills | Status: DC

## 2015-12-30 NOTE — Progress Notes (Signed)
Subjective:  Patient ID: Kathy [name], female    DOB: 06/01/1960  Age: 55 y.o. MRN: [id number]  CC: The primary encounter diagnosis was Polycythemia, secondary. Diagnoses of Fatty liver, Hypokalemia, B12 deficiency, Mild malnutrition ([place]), Vitamin D deficiency, Fatigue, unspecified type, Closed fracture of left ankle with routine healing, subsequent encounter, B12 nutritional deficiency, Alcoholic peripheral neuropathy ([place]), and Hypomagnesemia were also pertinent to this visit.  HPI Robbi Maelyn Salser presents for follow up on alcoholism with peripheral neuropathy,  osteoporosis with recent traumatic fracture that occurred while  Receiving inpatient treatment for alcohol dependence at [facility].    Dr Lake Bells , her rehab MD  Stopped her Lyrica without a taper and changed her to Gabapentin which was not effective. She Is now taking lyrica for severe alcoholic neuropathy , seeing Dr Melrose Nakayama in early October,  4 month follow up.   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  Allowed to take frequent breaks to get off her eet.    Admission for Left ankle fracture, complicated by pneumonia,  Spent a week at [facility], then a week at  [facility] . Now getting PT.  pain was better controlled on tramadol but now taking tylenol 1500 mg daily .  Trouble sleeping due to neuropathy Iin feet with shooting pains up to knees,  Hands numb, without pain . Remeron did not help.   Bentyl helping with GI issues ,  nonsedating . Appetite has increased.  Craving sweets  B12 deficiency: Has not had a B12 injection since she was admitted to [facility].  .   Lab Results  Component Value Date   VITAMINB12 250 12/30/2015      Outpatient Medications Prior to Visit  Medication Sig Dispense Refill  . cetirizine (ZYRTEC) 10 MG tablet Take 10 mg by mouth daily.    . mirtazapine (REMERON) 15 MG tablet Take 1 tablet (15 mg total) by mouth at bedtime. 30 tablet 5  . omeprazole (PRILOSEC) 40  MG capsule TAKE ONE CAPSULE BY MOUTH TWICE A DAY 180 capsule 1  . thiamine 100 MG tablet Take 1 tablet (100 mg total) by mouth daily.    [name] dicyclomine (BENTYL) 20 MG tablet Take 1 tablet (20 mg total) by mouth every 6 (six) hours as needed for spasms. 30 tablet 3  . pregabalin (LYRICA) 50 MG capsule Take 1 capsule (50 mg total) by mouth 3 (three) times daily. (Patient taking differently: Take 75 mg by mouth 3 (three) times daily. ) 90 capsule 5  . alum & mag hydroxide-simeth (MAALOX/MYLANTA) 200-200-20 MG/5ML suspension Take 30 mLs by mouth every 6 (six) hours as needed for indigestion or heartburn.     . cloNIDine (CATAPRES - DOSED IN MG/24 HR) 0.1 mg/24hr patch Place 0.1 mg onto the skin once a week. Placed 09/08/15    . ondansetron (ZOFRAN) 8 MG tablet Take 8 mg by mouth 2 (two) times daily as needed for nausea or vomiting.    . potassium chloride SA (KLOR-CON M20) 20 MEQ tablet Take 1 tablet (20 mEq total) by mouth 2 (two) times daily. (Patient not taking: Reported on 12/30/2015) 14 tablet 0  . QUEtiapine (SEROQUEL) 50 MG tablet Take 1 tablet (50 mg total) by mouth at bedtime. (Patient not taking: Reported on 12/30/2015) 30 tablet 1  . traMADol (ULTRAM) 50 MG tablet Take 50 mg by mouth 2 (two) times daily as needed (Toe pain).     No facility-administered medications prior to visit.  Review of Systems;  Patient denies headache, fevers, malaise, unintentional weight loss, skin rash, eye pain, sinus congestion and sinus pain, sore throat, dysphagia,  hemoptysis , cough, dyspnea, wheezing, chest pain, palpitations, orthopnea, edema, abdominal pain, nausea, melena, diarrhea, constipation, flank pain, dysuria, hematuria, urinary  Frequency, nocturia, numbness, tingling, seizures,  Focal weakness, Loss of consciousness,  Tremor, insomnia, depression, anxiety, and suicidal ideation.      Objective:  BP 108/60   Pulse 87   Temp 97.7 F (36.5 C) (Oral)   Resp 12   Ht 5\' 1"  (1.549 m)   Wt 102 lb  8 oz (46.5 kg)   SpO2 99%   BMI 19.37 kg/m   BP Readings from Last 3 Encounters:  12/30/15 108/60  10/17/15 100/76  09/23/15 138/72    Wt Readings from Last 3 Encounters:  12/30/15 102 lb 8 oz (46.5 kg)  10/17/15 101 lb (45.8 kg)  09/23/15 106 lb (48.1 kg)    General appearance: alert, cooperative and appears stated age Ears: normal TM's and external ear canals both ears Throat: lips, mucosa, and tongue normal; teeth and gums normal Neck: no adenopathy, no carotid bruit, supple, symmetrical, trachea midline and thyroid not enlarged, symmetric, no tenderness/mass/nodules Back: symmetric, no curvature. ROM normal. No CVA tenderness. Lungs: clear to auscultation bilaterally Heart: regular rate and rhythm, S1, S2 normal, no murmur, click, rub or gallop Abdomen: soft, non-tender; bowel sounds normal; no masses,  no organomegaly Pulses: 2+ and symmetric Skin: Skin color, texture, turgor normal. No rashes or lesions Lymph nodes: Cervical, supraclavicular, and axillary nodes normal.  Lab Results  Component Value Date   HGBA1C 5.3 04/22/2015   HGBA1C 5.5 10/11/2014    Lab Results  Component Value Date   CREATININE 0.49 12/30/2015   CREATININE 0.49 09/16/2015   CREATININE 0.53 09/15/2015    Lab Results  Component Value Date   WBC 9.1 12/30/2015   HGB 13.4 12/30/2015   HCT 39.4 12/30/2015   PLT 244.0 12/30/2015   GLUCOSE 88 12/30/2015   CHOL 193 10/11/2014   TRIG 282.0 (H) 10/11/2014   HDL 45.30 10/11/2014   LDLDIRECT 119.0 10/11/2014   ALT 9 12/30/2015   AST 13 12/30/2015   NA 137 12/30/2015   K 4.6 12/30/2015   CL 102 12/30/2015   CREATININE 0.49 12/30/2015   BUN 9 12/30/2015   CO2 28 12/30/2015   TSH 1.57 12/30/2015   HGBA1C 5.3 04/22/2015    Dg Chest 2 View  Result Date: 09/10/2015 CLINICAL DATA:  Cough for 1 day EXAM: CHEST  2 VIEW COMPARISON:  August 24, 2015 FINDINGS: There is mild generalized interstitial thickening without frank edema or consolidation.  Heart size and pulmonary vascularity are normal. No adenopathy. There is a focal hiatal hernia. No bone lesions. IMPRESSION: Focal hiatal hernia. Interstitium shows mild generalized thickening without frank edema or consolidation. This appearance is stable compared to recent prior study. Electronically Signed   By: Lowella Grip III M.D.   On: 09/10/2015 16:01   Dg Ankle Complete Left  Result Date: 09/10/2015 CLINICAL DATA:  55 year old female fell last night with left ankle injury. Anterior lateral pain. Swelling. Initial encounter. EXAM: LEFT ANKLE COMPLETE - 3+ VIEW COMPARISON:  None. FINDINGS: Oblique slightly displaced fracture left lateral malleolus with adjacent soft tissue swelling. No other fracture noted. Ankle mortise appears intact. Plantar spur. IMPRESSION: Oblique slightly displaced fracture left lateral malleolus with adjacent soft tissue swelling. Electronically Signed   By: Genia Del M.D.   On:  09/10/2015 10:09   Ct Head Wo Contrast  Result Date: 09/10/2015 CLINICAL DATA:  Fall EXAM: CT HEAD WITHOUT CONTRAST TECHNIQUE: Contiguous axial images were obtained from the base of the skull through the vertex without intravenous contrast. COMPARISON:  None. FINDINGS: No skull fracture is noted. Paranasal sinuses and mastoid air cells are unremarkable. No intracranial hemorrhage, mass effect or midline shift. Mild cerebral atrophy. No acute cortical infarction. No mass lesion is noted on this unenhanced scan. No hydrocephalus. No intra or extra-axial fluid collection. IMPRESSION: No acute intracranial abnormality.  Mild cerebral atrophy. Electronically Signed   By: Lahoma Crocker M.D.   On: 09/10/2015 12:51    Assessment & Plan:   Problem List Items Addressed This Visit    Alcoholic peripheral neuropathy ([place])    Continue Lyrica..  Adding elavil for concurrent insomnia.       Relevant Medications   LYRICA 75 MG capsule   amitriptyline (ELAVIL) 25 MG tablet   RESOLVED: Hypokalemia    Closed left ankle fracture    Slightly displaced left lateral malleolus.  Sustained during a fall while at [place].  Replacing low Vitamin D with Drisdol .  This is her second fracture n less than a year.  DEXA scan ordered.       B12 nutritional deficiency    Patient will return for B12 injections.  Lab Results  Component Value Date   VITAMINB12 250 12/30/2015         Hypomagnesemia    Secondary to alcohol dependence.   Recommended daily supplementation for one month for  borderline level.       Fatty liver   Polycythemia, secondary - Primary    Other Visit Diagnoses    B12 deficiency       Relevant Medications   cyanocobalamin ((VITAMIN B-12)) injection 1,000 mcg (Completed)   Other Relevant Orders   Vitamin B12 (Completed)   Methylmalonic Acid   Mild malnutrition (HCC)       Relevant Orders   Folate RBC   Iron and TIBC   CBC with Differential/Platelet (Completed)   Magnesium (Completed)   Vitamin D deficiency       Relevant Orders   VITAMIN D 25 Hydroxy (Vit-D Deficiency, Fractures) (Completed)   Fatigue, unspecified type       Relevant Orders   TSH (Completed)   Comprehensive metabolic panel (Completed)      I have discontinued Ms. Shevlin's potassium chloride SA, traMADol, QUEtiapine, and pregabalin. I am also having her start on amitriptyline. Additionally, I am having her maintain her mirtazapine, cloNIDine, alum & mag hydroxide-simeth, cetirizine, ondansetron, thiamine, omeprazole, aspirin EC, folic acid, LYRICA, and dicyclomine. We administered cyanocobalamin.  Meds ordered this encounter  Medications  . aspirin EC 81 MG tablet    Sig: Take 81 mg by mouth daily.  . folic acid (FOLVITE) 1 MG tablet    Sig: Take 1 mg by mouth daily.   [name] LYRICA 75 MG capsule    Sig: Take 75 mg by mouth 3 (three) times daily.     Refill:  0  . DISCONTD: dicyclomine (BENTYL) 20 MG tablet    Sig: Take 1 tablet (20 mg total) by mouth every 6 (six) hours as needed for  spasms.    Dispense:  120 tablet    Refill:  5  . amitriptyline (ELAVIL) 25 MG tablet    Sig: Take 1 tablet (25 mg total) by mouth at bedtime. Increase weekly as needed by 25 mg  Dispense:  90 tablet    Refill:  1  . cyanocobalamin ((VITAMIN B-12)) injection 1,000 mcg  . dicyclomine (BENTYL) 20 MG tablet    Sig: Take 1 tablet (20 mg total) by mouth every 6 (six) hours as needed for spasms.    Dispense:  120 tablet    Refill:  5   A total of 25 minutes of face to face time was spent with patient more than half of which was spent in counselling about the above mentioned conditions  and coordination of care    Medications Discontinued During This Encounter  Medication Reason  . pregabalin (LYRICA) 50 MG capsule Change in therapy  . traMADol (ULTRAM) 50 MG tablet   . QUEtiapine (SEROQUEL) 50 MG tablet   . potassium chloride SA (KLOR-CON M20) 20 MEQ tablet   . dicyclomine (BENTYL) 20 MG tablet Reorder  . dicyclomine (BENTYL) 20 MG tablet Reorder    Follow-up: Return in about 3 months (around 03/31/2016).   Crecencio Mc, MD

## 2015-12-30 NOTE — Progress Notes (Signed)
Pre-visit discussion using our clinic review tool. No additional management support is needed unless otherwise documented below in the visit note.  

## 2015-12-30 NOTE — Patient Instructions (Addendum)
Trial of Elavil 25 mg by 8 pm, for your insomnia  Due to neuropathy.  You can increase the dose to 50 mg after a week if needed. (Do this on the night before a day off!)    If your B12 level is low,  We will have your return for weekly b12 injections

## 2015-12-31 LAB — FOLATE RBC: RBC Folate: 555 ng/mL (ref >280)

## 2015-12-31 LAB — IRON AND TIBC
%SAT: 21 % (ref 11–50)
Iron: 74 ug/dL (ref 45–160)
TIBC: 356 ug/dL (ref 250–450)
UIBC: 282 ug/dL (ref 125–400)

## 2016-01-01 ENCOUNTER — Other Ambulatory Visit: Payer: Self-pay | Admitting: Internal Medicine

## 2016-01-01 DIAGNOSIS — D510 Vitamin B12 deficiency anemia due to intrinsic factor deficiency: Secondary | ICD-10-CM | POA: Insufficient documentation

## 2016-01-01 MED ORDER — ERGOCALCIFEROL 1.25 MG (50000 UT) PO CAPS: 50000.0000 [IU] | ORAL_CAPSULE | ORAL | 3 refills | Status: DC

## 2016-01-01 MED ORDER — MAGNESIUM OXIDE 400 MG PO TABS: 400.0000 mg | ORAL_TABLET | Freq: Two times a day (BID) | ORAL | 0 refills | Status: DC

## 2016-01-01 NOTE — Assessment & Plan Note (Signed)
Patient will return for B12 injections.  Lab Results  Component Value Date   VITAMINB12 250 12/30/2015

## 2016-01-01 NOTE — Assessment & Plan Note (Signed)
Secondary to alcohol dependence.   Recommended daily supplementation for one month for  borderline level.

## 2016-01-01 NOTE — Assessment & Plan Note (Addendum)
Slightly displaced left lateral malleolus.  Sustained during a fall while at [place].  Replacing low Vitamin D with Drisdol .  This is her second fracture n less than a year.  DEXA scan ordered.

## 2016-01-01 NOTE — Assessment & Plan Note (Signed)
Continue Lyrica..  Adding elavil for concurrent insomnia.

## 2016-01-02 LAB — METHYLMALONIC ACID, SERUM: Methylmalonic Acid, Quant: 232 nmol/L (ref 87–318)

## 2016-01-06 ENCOUNTER — Ambulatory Visit (INDEPENDENT_AMBULATORY_CARE_PROVIDER_SITE_OTHER): Payer: BLUE CROSS/BLUE SHIELD

## 2016-01-06 DIAGNOSIS — E538 Deficiency of other specified B group vitamins: Secondary | ICD-10-CM

## 2016-01-06 MED ORDER — CYANOCOBALAMIN 1000 MCG/ML IJ SOLN
1000.0000 ug | Freq: Once | INTRAMUSCULAR | Status: AC
  Administered 2016-01-06: 1000 ug via INTRAMUSCULAR

## 2016-01-06 NOTE — Progress Notes (Signed)
Patient presents for B 12 injection.  Injected right deltoid patient tolerated procedure well.

## 2016-01-13 ENCOUNTER — Ambulatory Visit (INDEPENDENT_AMBULATORY_CARE_PROVIDER_SITE_OTHER): Payer: BLUE CROSS/BLUE SHIELD

## 2016-01-13 DIAGNOSIS — E538 Deficiency of other specified B group vitamins: Secondary | ICD-10-CM | POA: Diagnosis not present

## 2016-01-13 MED ORDER — CYANOCOBALAMIN 1000 MCG/ML IJ SOLN
1000.0000 ug | Freq: Once | INTRAMUSCULAR | Status: AC
  Administered 2016-01-13: 1000 ug via INTRAMUSCULAR

## 2016-01-13 NOTE — Progress Notes (Signed)
Patient comes in for B 12 injection .   Injected left deltoid this is patients 2 nd injection.  Patient tolerated injection well.

## 2016-01-21 ENCOUNTER — Ambulatory Visit (INDEPENDENT_AMBULATORY_CARE_PROVIDER_SITE_OTHER): Payer: BLUE CROSS/BLUE SHIELD

## 2016-01-21 DIAGNOSIS — E538 Deficiency of other specified B group vitamins: Secondary | ICD-10-CM | POA: Diagnosis not present

## 2016-01-21 MED ORDER — CYANOCOBALAMIN 1000 MCG/ML IJ SOLN
1000.0000 ug | Freq: Once | INTRAMUSCULAR | Status: AC
  Administered 2016-01-21: 1000 ug via INTRAMUSCULAR

## 2016-01-21 NOTE — Progress Notes (Signed)
  I have reviewed the above information and agree with above.   Suede Greenawalt, MD 

## 2016-01-21 NOTE — Progress Notes (Signed)
  I have reviewed the above information and agree with above.   Shandell Jallow, MD 

## 2016-01-21 NOTE — Progress Notes (Signed)
Patient comes in for 3rd B 12 injection.  Injected right deltoid .  Patient tolerated injection well.   Patient will follow up with monthly injections.

## 2016-01-28 NOTE — Progress Notes (Signed)
  I have reviewed the above information and agree with above.   Cathy Crounse, MD 

## 2016-01-29 ENCOUNTER — Telehealth: Payer: Self-pay | Admitting: Internal Medicine

## 2016-01-29 MED ORDER — TRAMADOL HCL 50 MG PO TABS: 50.0000 mg | ORAL_TABLET | Freq: Two times a day (BID) | ORAL | 5 refills | Status: DC | PRN

## 2016-01-29 NOTE — Telephone Encounter (Signed)
Pt called about wanting to get a Rx for tramadol and it was discussed with Dr Derrel Nip at pt last visit. Thank you!

## 2016-01-29 NOTE — Telephone Encounter (Signed)
Patient wants to know if you will give script for tramadol patient said she discussed with you at last visit, pain in feet has gotten worse  Patient stated if she needed some extra relief you recommended going back on tramadol. I could not find this in last OV note.

## 2016-01-29 NOTE — Telephone Encounter (Signed)
Script faxed.

## 2016-01-29 NOTE — Telephone Encounter (Signed)
WILL REFILL FOR USE ONCE DAILY

## 2016-01-30 ENCOUNTER — Ambulatory Visit
Admission: RE | Admit: 2016-01-30 | Discharge: 2016-01-30 | Disposition: A | Discharge status: Home / Self Care | Payer: BLUE CROSS/BLUE SHIELD | Source: Ambulatory Visit | Attending: Internal Medicine | Admitting: Internal Medicine

## 2016-01-30 ENCOUNTER — Other Ambulatory Visit: Payer: Self-pay | Admitting: Internal Medicine

## 2016-01-30 DIAGNOSIS — Z1231 Encounter for screening mammogram for malignant neoplasm of breast: Secondary | ICD-10-CM

## 2016-01-30 DIAGNOSIS — M810 Age-related osteoporosis without current pathological fracture: Secondary | ICD-10-CM | POA: Diagnosis not present

## 2016-01-30 DIAGNOSIS — Z780 Asymptomatic menopausal state: Secondary | ICD-10-CM | POA: Diagnosis not present

## 2016-01-30 DIAGNOSIS — Z13820 Encounter for screening for osteoporosis: Secondary | ICD-10-CM | POA: Insufficient documentation

## 2016-02-04 ENCOUNTER — Telehealth: Payer: Self-pay | Admitting: Internal Medicine

## 2016-02-04 DIAGNOSIS — E871 Hypo-osmolality and hyponatremia: Secondary | ICD-10-CM

## 2016-02-04 MED ORDER — GABAPENTIN 300 MG PO CAPS: 300.0000 mg | ORAL_CAPSULE | Freq: Three times a day (TID) | ORAL | 3 refills | Status: DC

## 2016-02-04 NOTE — Telephone Encounter (Signed)
Patient first started back on Lyrica 12/25/15 , she is having edema in extremities that resides with elevation and vision is a little blurry , patient is at work and wanted to know if she can ween off lyrica and try the gabapentin again. Patient has follow up schedule in January.

## 2016-02-04 NOTE — Telephone Encounter (Signed)
Pt called about being onLYRICA 75 MG capsule  she's having some side effects and feet are swollen and blurred vision. Pt wants to try the gabapentin again. Please advise? Pt has some gabapentin. Thank you!  Call pt @ [phone].

## 2016-02-04 NOTE — Telephone Encounter (Signed)
Left second message for patient to call office

## 2016-02-04 NOTE — Telephone Encounter (Signed)
Left third message for patient to call office.

## 2016-02-04 NOTE — Telephone Encounter (Signed)
Left   Message for patient to call office to schedule Lab as soon as she receives message.

## 2016-02-04 NOTE — Telephone Encounter (Signed)
Needs to have her sodium m level checked asap.  Labs ordered.  Yes, take twice daily for 3 days,  Then once daily for 3 days then stop.  Resume gabapentin on Day 7 at 300 mg daily for 2 days then twice daily for 2 days then three times daily thereafter  Both cause edema.  She will need to get some compression stockings to start wearing daily  She can buy the itc kidn at any hosiery place  Gabapentin rx sent.

## 2016-02-05 ENCOUNTER — Other Ambulatory Visit: Payer: Self-pay | Admitting: *Deleted

## 2016-02-05 ENCOUNTER — Inpatient Hospital Stay
Admission: RE | Admit: 2016-02-05 | Discharge: 2016-02-05 | Disposition: A | Discharge status: Home / Self Care | Payer: Self-pay | Source: Ambulatory Visit | Attending: *Deleted | Admitting: *Deleted

## 2016-02-05 ENCOUNTER — Other Ambulatory Visit (INDEPENDENT_AMBULATORY_CARE_PROVIDER_SITE_OTHER): Payer: BLUE CROSS/BLUE SHIELD

## 2016-02-05 DIAGNOSIS — E871 Hypo-osmolality and hyponatremia: Secondary | ICD-10-CM

## 2016-02-05 DIAGNOSIS — Z9289 Personal history of other medical treatment: Secondary | ICD-10-CM

## 2016-02-05 LAB — BASIC METABOLIC PANEL
BUN: 10 mg/dL (ref 6–23)
CO2: 27 mEq/L (ref 19–32)
Calcium: 9.4 mg/dL (ref 8.4–10.5)
Chloride: 102 mEq/L (ref 96–112)
Creatinine, Ser: 0.55 mg/dL (ref 0.40–1.20)
GFR: 122.37 mL/min (ref >60.00)
Glucose, Bld: 118 mg/dL — ABNORMAL HIGH (ref 70–99)
Potassium: 4.3 mEq/L (ref 3.5–5.1)
Sodium: 137 mEq/L (ref 135–145)

## 2016-02-05 NOTE — Telephone Encounter (Signed)
Talked with patient voiced understanding to medication regimen and scheduled to day for lab

## 2016-02-06 NOTE — Telephone Encounter (Signed)
Her sodium level is normal.  No worries. Can change back from  lyrica to gabapentin as dicussed

## 2016-02-06 NOTE — Telephone Encounter (Signed)
Patient notified and voiced understanding.

## 2016-02-14 ENCOUNTER — Telehealth: Payer: Self-pay | Admitting: Internal Medicine

## 2016-02-14 NOTE — Telephone Encounter (Signed)
Pt called and would like to know if we can her blood type on file. If not can Dr. Derrel Nip order it for when see comes in for her appt on 03/11/16

## 2016-02-14 NOTE — Telephone Encounter (Signed)
Avised patient we do not have patient blood type on file but could be ordered.

## 2016-02-19 ENCOUNTER — Telehealth: Payer: Self-pay | Admitting: Internal Medicine

## 2016-02-19 ENCOUNTER — Other Ambulatory Visit: Payer: Self-pay | Admitting: Internal Medicine

## 2016-02-19 MED ORDER — TRAMADOL HCL 50 MG PO TABS: 50.0000 mg | ORAL_TABLET | Freq: Two times a day (BID) | ORAL | 5 refills | Status: DC

## 2016-02-19 NOTE — Telephone Encounter (Signed)
Last filled 01/29/16. Pharmacy note says, "pt is taking 2 pills a day for pain. Pt wanting to know if okay to refill early. Pt is out of medication" Last OV 12/30/15. Has follow up on 03/11/16.

## 2016-02-19 NOTE — Telephone Encounter (Signed)
Yes, you can fax it tonight

## 2016-02-19 NOTE — Telephone Encounter (Signed)
Script faxed.

## 2016-02-19 NOTE — Telephone Encounter (Signed)
Pt called and stated that she has been taking her traMADol (ULTRAM) 50 MG tablet twice a day. She is not feeling groggy when she takes them in the morning. Pt would like to know if Dr. Derrel Nip can refill them for twice a day. She is currently out of the medication. Please advise, thank you!  [place], [place]  Call pt @ [phone]

## 2016-02-20 ENCOUNTER — Telehealth: Payer: Self-pay | Admitting: Internal Medicine

## 2016-02-20 NOTE — Telephone Encounter (Signed)
Lmom for pharmacy to call back to see if patient received Tramadol 50mg 

## 2016-02-20 NOTE — Telephone Encounter (Signed)
Pharmacy called and stated that have received fax for medication.

## 2016-02-20 NOTE — Telephone Encounter (Signed)
lmom for pharmacy to callback

## 2016-02-20 NOTE — Telephone Encounter (Signed)
Thi was refilled yesterday . Please confirm with pharmacy

## 2016-02-26 NOTE — Telephone Encounter (Signed)
Rx Tramadol 50mg  was picked up from pharmacy

## 2016-03-11 ENCOUNTER — Ambulatory Visit: Payer: BLUE CROSS/BLUE SHIELD | Admitting: Internal Medicine

## 2016-03-13 ENCOUNTER — Telehealth: Payer: Self-pay

## 2016-03-13 ENCOUNTER — Telehealth: Payer: Self-pay | Admitting: Internal Medicine

## 2016-03-13 MED ORDER — GABAPENTIN 300 MG PO CAPS: 600.0000 mg | ORAL_CAPSULE | Freq: Three times a day (TID) | ORAL | 3 refills | Status: DC

## 2016-03-13 MED ORDER — TRAMADOL HCL 50 MG PO TABS: 50.0000 mg | ORAL_TABLET | Freq: Two times a day (BID) | ORAL | 0 refills | Status: DC

## 2016-03-13 NOTE — Telephone Encounter (Signed)
error 

## 2016-03-13 NOTE — Telephone Encounter (Signed)
meds refilled per request

## 2016-03-13 NOTE — Telephone Encounter (Signed)
Pt called to reschedule her appt from 1/17. She stated that she has changed how she gabapentin (NEURONTIN) 300 MG capsule 2 tablets 3 times daily, and she is taking her traMADol (ULTRAM) 50 MG tablet needs it for 3 times a day and will be out Monday. Please advise, thank you!  Call pt @ [place], [place]

## 2016-03-16 ENCOUNTER — Telehealth: Payer: Self-pay | Admitting: *Deleted

## 2016-03-16 NOTE — Telephone Encounter (Signed)
Pt requested a medication refill for gabapentin  Pharmacy [facility]

## 2016-03-17 NOTE — Telephone Encounter (Signed)
Lmom, Gabapentin medication was sent to pharmacy 03/13/2016

## 2016-03-18 ENCOUNTER — Other Ambulatory Visit: Payer: Self-pay

## 2016-03-18 MED ORDER — GABAPENTIN 300 MG PO CAPS: 600.0000 mg | ORAL_CAPSULE | Freq: Three times a day (TID) | ORAL | 3 refills | Status: DC

## 2016-03-18 NOTE — Telephone Encounter (Signed)
Pt requested the gabapentin Rx be resent to [facility], pharmacy did not receive Rx

## 2016-03-18 NOTE — Telephone Encounter (Signed)
Pt called back and stated that the pharmacy does not have this medication. It looks like the fax transmission had failed when sent to the pharmacy. Please call pt when completed.

## 2016-03-18 NOTE — Telephone Encounter (Signed)
There was a problem with submitting electronically. Rx was called in around 4:45pm by javier.

## 2016-03-19 ENCOUNTER — Ambulatory Visit (INDEPENDENT_AMBULATORY_CARE_PROVIDER_SITE_OTHER): Payer: BLUE CROSS/BLUE SHIELD

## 2016-03-19 DIAGNOSIS — E538 Deficiency of other specified B group vitamins: Secondary | ICD-10-CM

## 2016-03-19 MED ORDER — CYANOCOBALAMIN 1000 MCG/ML IJ SOLN
1000.0000 ug | Freq: Once | INTRAMUSCULAR | Status: AC
  Administered 2016-03-19: 1000 ug via INTRAMUSCULAR

## 2016-03-19 NOTE — Progress Notes (Signed)
Patient comes in today for vitamin B12 injection. Given in Left Deltoid. Tolerated well.

## 2016-03-20 ENCOUNTER — Telehealth: Payer: Self-pay | Admitting: Internal Medicine

## 2016-03-20 NOTE — Telephone Encounter (Signed)
Pt called and stated that she was denied for Disability and was wondering if Dr. Derrel Nip could write a letter with her finding as far as pt's neuropathy and what she can and cannot do (cannot work more than 3 hrs a day a few days a week), and list as medications. Please advise, thank you!  Call pt @ [phone]

## 2016-03-24 NOTE — Telephone Encounter (Signed)
Patient stated that she will need a letter to her Employer this can be addressed to [employer]. This letter will need to address her neuropathy, and reasoning to her not being able to stand for long periods of time  Please call Pt when ready : contact [phone]

## 2016-03-24 NOTE — Telephone Encounter (Signed)
I do not write letters for disability for patients seeking disability by the social security administration.  This is done by independent physicians employed by the government.  If she is asking for a letter to her employer explaining her problems I can do that . Please clarify purpose of letter/request

## 2016-03-24 NOTE — Telephone Encounter (Signed)
Work note written, printed and signed .  Placed in red folder for pickup

## 2016-03-25 ENCOUNTER — Telehealth: Payer: Self-pay

## 2016-03-25 MED ORDER — TRAMADOL HCL 50 MG PO TABS: 50.0000 mg | ORAL_TABLET | Freq: Three times a day (TID) | ORAL | 0 refills | Status: DC

## 2016-03-25 NOTE — Telephone Encounter (Signed)
Rx refill needed for Tramadol 50 mg per patient Bid but it taking TID due pain gets worse while working. She takes 1 before work and 1 after work and one before bedtime.  Last refilled 03/13/2016 Next appt 04/15/2016 LOV: 12/30/2015 She is on the Cancellation wait list and is trying to get in sooner Please advise

## 2016-03-25 NOTE — Telephone Encounter (Signed)
Patient notified

## 2016-03-25 NOTE — Telephone Encounter (Signed)
REFILLED FOR #90 TABLETS KEEP FEB 21 APPT

## 2016-03-25 NOTE — Telephone Encounter (Signed)
Rx faxed to [facility].

## 2016-04-15 ENCOUNTER — Encounter: Payer: Self-pay | Admitting: Internal Medicine

## 2016-04-15 ENCOUNTER — Ambulatory Visit (INDEPENDENT_AMBULATORY_CARE_PROVIDER_SITE_OTHER): Payer: BLUE CROSS/BLUE SHIELD | Admitting: Internal Medicine

## 2016-04-15 VITALS — BP 138/80 | HR 94 | Resp 16 | Wt 116.0 lb

## 2016-04-15 DIAGNOSIS — G621 Alcoholic polyneuropathy: Secondary | ICD-10-CM

## 2016-04-15 DIAGNOSIS — E538 Deficiency of other specified B group vitamins: Secondary | ICD-10-CM | POA: Diagnosis not present

## 2016-04-15 DIAGNOSIS — D751 Secondary polycythemia: Secondary | ICD-10-CM | POA: Diagnosis not present

## 2016-04-15 MED ORDER — GABAPENTIN 400 MG PO CAPS: 400.0000 mg | ORAL_CAPSULE | Freq: Three times a day (TID) | ORAL | 11 refills | Status: DC

## 2016-04-15 MED ORDER — CYANOCOBALAMIN 1000 MCG/ML IJ SOLN
1000.0000 ug | Freq: Once | INTRAMUSCULAR | Status: AC
  Administered 2016-04-15: 1000 ug via INTRAMUSCULAR

## 2016-04-15 MED ORDER — TRAMADOL HCL 50 MG PO TABS: 50.0000 mg | ORAL_TABLET | Freq: Three times a day (TID) | ORAL | 5 refills | Status: DC

## 2016-04-15 MED ORDER — GABAPENTIN 400 MG PO CAPS: 800.0000 mg | ORAL_CAPSULE | Freq: Three times a day (TID) | ORAL | 5 refills | Status: DC

## 2016-04-15 NOTE — Progress Notes (Signed)
Subjective:  Patient ID: Kathy [name], female    DOB: Jul 03, 1960  Age: 56 y.o. MRN: [id number]  CC: The primary encounter diagnosis was B12 nutritional deficiency. Diagnoses of Polycythemia, secondary and Alcoholic peripheral neuropathy ([place]) were also pertinent to this visit.  HPI Kathy [name] presents for FOLLOW UP ON ALCOHOLIC NEUROPATHY  Tramadol dose was increased to 3/daily last month,  Pain stll not controlled with dosing three  times daily . Had joint pain and blurred vision with lyrica   Using gabapentin 2  300 mg tabs 3 times daily   Smoking less , one pack lasting 2.5 days   Attending AA regularly .  No desire to drink. Has been sober since discharge   `    Outpatient Medications Prior to Visit  Medication Sig Dispense Refill  . alum & mag hydroxide-simeth (MAALOX/MYLANTA) 200-200-20 MG/5ML suspension Take 30 mLs by mouth every 6 (six) hours as needed for indigestion or heartburn.     [name] aspirin EC 81 MG tablet Take 81 mg by mouth daily.    . cloNIDine (CATAPRES - DOSED IN MG/24 HR) 0.1 mg/24hr patch Place 0.1 mg onto the skin once a week. Placed 09/08/15    . dicyclomine (BENTYL) 20 MG tablet Take 1 tablet (20 mg total) by mouth every 6 (six) hours as needed for spasms. 120 tablet 5  . ergocalciferol (DRISDOL) 50000 units capsule Take 1 capsule (50,000 Units total) by mouth once a week. 4 capsule 3  . folic acid (FOLVITE) 1 MG tablet Take 1 mg by mouth daily.     . magnesium oxide (MAG-OX) 400 MG tablet Take 1 tablet (400 mg total) by mouth 2 (two) times daily. 60 tablet 0  . omeprazole (PRILOSEC) 40 MG capsule TAKE ONE CAPSULE BY MOUTH TWICE A DAY 180 capsule 1  . thiamine 100 MG tablet Take 1 tablet (100 mg total) by mouth daily.    [name] gabapentin (NEURONTIN) 300 MG capsule Take 2 capsules (600 mg total) by mouth 3 (three) times daily. 180 capsule 3  . traMADol (ULTRAM) 50 MG tablet Take 1 tablet (50 mg total) by mouth 3 (three) times daily. 90 tablet 0  .  amitriptyline (ELAVIL) 25 MG tablet Take 1 tablet (25 mg total) by mouth at bedtime. Increase weekly as needed by 25 mg (Patient not taking: Reported on 04/15/2016) 90 tablet 1  . cetirizine (ZYRTEC) 10 MG tablet Take 10 mg by mouth daily.    . mirtazapine (REMERON) 15 MG tablet Take 1 tablet (15 mg total) by mouth at bedtime. (Patient not taking: Reported on 04/15/2016) 30 tablet 5  . ondansetron (ZOFRAN) 8 MG tablet Take 8 mg by mouth 2 (two) times daily as needed for nausea or vomiting.     No facility-administered medications prior to visit.     Review of Systems;  Patient denies headache, fevers, malaise, unintentional weight loss, skin rash, eye pain, sinus congestion and sinus pain, sore throat, dysphagia,  hemoptysis , cough, dyspnea, wheezing, chest pain, palpitations, orthopnea, edema, abdominal pain, nausea, melena, diarrhea, constipation, flank pain, dysuria, hematuria, urinary  Frequency, nocturia, numbness, tingling, seizures,  Focal weakness, Loss of consciousness,  Tremor, insomnia, depression, anxiety, and suicidal ideation.      Objective:  BP 138/80   Pulse 94   Resp 16   Wt 116 lb (52.6 kg)   SpO2 90%   BMI 21.92 kg/m   BP Readings from Last 3 Encounters:  04/15/16 138/80  12/30/15 108/60  10/17/15  100/76    Wt Readings from Last 3 Encounters:  04/15/16 116 lb (52.6 kg)  12/30/15 102 lb 8 oz (46.5 kg)  10/17/15 101 lb (45.8 kg)    General appearance: alert, cooperative and appears older than her stated age Ears: normal TM's and external ear canals both ears Throat: lips, mucosa, and tongue normal; teeth and gums normal Neck: no adenopathy, no carotid bruit, supple, symmetrical, trachea midline and thyroid not enlarged, symmetric, no tenderness/mass/nodules Back: symmetric, no curvature. ROM normal. No CVA tenderness. Lungs: clear to auscultation bilaterally Heart: regular rate and rhythm, S1, S2 normal, no murmur, click, rub or gallop Abdomen: soft,  non-tender; bowel sounds normal; no masses,  no organomegaly Pulses: 2+ and symmetric Skin: Skin color, texture, turgor normal. No rashes or lesions Lymph nodes: Cervical, supraclavicular, and axillary nodes normal.  Lab Results  Component Value Date   HGBA1C 5.3 04/22/2015   HGBA1C 5.5 10/11/2014    Lab Results  Component Value Date   CREATININE 0.55 02/05/2016   CREATININE 0.49 12/30/2015   CREATININE 0.49 09/16/2015    Lab Results  Component Value Date   WBC 9.1 12/30/2015   HGB 13.4 12/30/2015   HCT 39.4 12/30/2015   PLT 244.0 12/30/2015   GLUCOSE 118 (H) 02/05/2016   CHOL 193 10/11/2014   TRIG 282.0 (H) 10/11/2014   HDL 45.30 10/11/2014   LDLDIRECT 119.0 10/11/2014   ALT 9 12/30/2015   AST 13 12/30/2015   NA 137 02/05/2016   K 4.3 02/05/2016   CL 102 02/05/2016   CREATININE 0.55 02/05/2016   BUN 10 02/05/2016   CO2 27 02/05/2016   TSH 1.57 12/30/2015   HGBA1C 5.3 04/22/2015    Mm Outside Films Mammo  Result Date: 02/05/2016 This examination belongs to an outside facility and is stored here for comparison purposes only.  Contact the originating outside institution for any associated report or interpretation.   Assessment & Plan:   Problem List Items Addressed This Visit    Alcoholic peripheral neuropathy ([place])    Managed with tramadol and gabapentin.  gabapentin dose increased to 800 mg tid.       Relevant Medications   gabapentin (NEURONTIN) 400 MG capsule   B12 nutritional deficiency - Primary   Relevant Medications   cyanocobalamin ((VITAMIN B-12)) injection 1,000 mcg (Completed)   Polycythemia, secondary    Secondary to tobacco abuse.  Lab Results  Component Value Date   WBC 9.1 12/30/2015   HGB 13.4 12/30/2015   HCT 39.4 12/30/2015   MCV 93.1 12/30/2015   PLT 244.0 12/30/2015            I have discontinued [name]'s gabapentin and gabapentin. I have also changed her traMADol. Additionally, I am having her start on gabapentin.  Lastly, I am having her maintain her mirtazapine, cloNIDine, alum & mag hydroxide-simeth, cetirizine, ondansetron, thiamine, omeprazole, aspirin EC, folic acid, amitriptyline, dicyclomine, ergocalciferol, and magnesium oxide. We administered cyanocobalamin.  Meds ordered this encounter  Medications  . traMADol (ULTRAM) 50 MG tablet    Sig: Take 1 tablet (50 mg total) by mouth 3 (three) times daily. As needed for pain    Dispense:  90 tablet    Refill:  5  . DISCONTD: gabapentin (NEURONTIN) 400 MG capsule    Sig: Take 1 capsule (400 mg total) by mouth 3 (three) times daily.    Dispense:  60 capsule    Refill:  11  . gabapentin (NEURONTIN) 400 MG capsule    Sig:  Take 2 capsules (800 mg total) by mouth 3 (three) times daily.    Dispense:  180 capsule    Refill:  5  . cyanocobalamin ((VITAMIN B-12)) injection 1,000 mcg    Medications Discontinued During This Encounter  Medication Reason  . gabapentin (NEURONTIN) 300 MG capsule   . traMADol (ULTRAM) 50 MG tablet Reorder  . gabapentin (NEURONTIN) 400 MG capsule     Follow-up: Return in about 3 months (around 07/13/2016).   Crecencio Mc, MD

## 2016-04-15 NOTE — Patient Instructions (Addendum)
Before we increase your tramadol dose again, let't see if a higher dose of gabapentin will help your pain   We are increasing your gabapentin  to 800 mg three times daily for pian   continue tramadol up to 3 daily

## 2016-04-15 NOTE — Progress Notes (Signed)
Pre visit review using our clinic review tool, if applicable. No additional management support is needed unless otherwise documented below in the visit note. 

## 2016-04-16 ENCOUNTER — Telehealth: Payer: Self-pay | Admitting: Internal Medicine

## 2016-04-16 ENCOUNTER — Encounter: Payer: Self-pay | Admitting: Internal Medicine

## 2016-04-16 NOTE — Telephone Encounter (Signed)
Pharmacy called and needs clarify pt's gabapentin (NEURONTIN) 400 MG capsule because she received 2 yesterday and deleted one of them. Can you resend. Please advise, thank you!  [place], [place]

## 2016-04-16 NOTE — Assessment & Plan Note (Signed)
Secondary to tobacco abuse.  Lab Results  Component Value Date   WBC 9.1 12/30/2015   HGB 13.4 12/30/2015   HCT 39.4 12/30/2015   MCV 93.1 12/30/2015   PLT 244.0 12/30/2015

## 2016-04-16 NOTE — Telephone Encounter (Signed)
Left detailed mess informing pharmacy of med changes that were discussed at pt's 04/15/16 OV.    Instructions      Return in about 3 months (around 07/13/2016).  Before we increase your tramadol dose again, let't see if a higher dose of gabapentin will help your pain   We are increasing your gabapentin  to 800 mg three times daily for pian   continue tramadol up to 3 daily

## 2016-04-16 NOTE — Assessment & Plan Note (Signed)
Managed with tramadol and gabapentin.  gabapentin dose increased to 800 mg tid.

## 2016-05-06 ENCOUNTER — Ambulatory Visit (INDEPENDENT_AMBULATORY_CARE_PROVIDER_SITE_OTHER): Payer: BLUE CROSS/BLUE SHIELD | Admitting: *Deleted

## 2016-05-06 DIAGNOSIS — E538 Deficiency of other specified B group vitamins: Secondary | ICD-10-CM | POA: Diagnosis not present

## 2016-05-06 MED ORDER — CYANOCOBALAMIN 1000 MCG/ML IJ SOLN
1000.0000 ug | Freq: Once | INTRAMUSCULAR | Status: AC
  Administered 2016-05-06: 1000 ug via INTRAMUSCULAR

## 2016-05-06 NOTE — Progress Notes (Signed)
Patient presented for B 12 injection to left deltoid , patient showed no signs of discomfort during injection.

## 2016-05-10 NOTE — Progress Notes (Signed)
  I have reviewed the above information and agree with above.   Javayah Magaw, MD 

## 2016-05-11 ENCOUNTER — Telehealth: Payer: Self-pay | Admitting: Internal Medicine

## 2016-05-11 ENCOUNTER — Other Ambulatory Visit: Payer: Self-pay

## 2016-05-11 MED ORDER — GABAPENTIN 400 MG PO CAPS: 800.0000 mg | ORAL_CAPSULE | Freq: Three times a day (TID) | ORAL | 5 refills | Status: DC

## 2016-05-11 NOTE — Telephone Encounter (Signed)
Received a message form pt stating that pharmacy has not received her new gabapentin rx. Called pharmacy and they stated that they have not received the rx. Rx has been resent. Pt was notified.

## 2016-05-11 NOTE — Telephone Encounter (Signed)
gabapentin (NEURONTIN) 800 MG capsule taken 3 times a day.

## 2016-05-11 NOTE — Telephone Encounter (Signed)
Called pharmacy and they stated that they never received the rx. R was sent in again and pt was notified.

## 2016-06-04 ENCOUNTER — Telehealth: Payer: Self-pay | Admitting: *Deleted

## 2016-06-04 NOTE — Telephone Encounter (Signed)
Patient transferred to [facility]

## 2016-06-05 ENCOUNTER — Telehealth: Payer: Self-pay | Admitting: Internal Medicine

## 2016-06-05 DIAGNOSIS — R7301 Impaired fasting glucose: Secondary | ICD-10-CM

## 2016-06-05 NOTE — Telephone Encounter (Signed)
Pt called and is requesting for lab orders to be put in to check her liver enzymes, and sugar. Please advise, thank you!  Call pt @ [phone]

## 2016-06-05 NOTE — Telephone Encounter (Signed)
Called pt and lab appt has been scheduled.

## 2016-06-05 NOTE — Telephone Encounter (Signed)
Is ok to order the liver panel and a CMP?

## 2016-06-05 NOTE — Telephone Encounter (Signed)
Lab Results  Component Value Date   HGBA1C 5.3 04/22/2015   Cmet and a1c ordered.  BTW, a Liver panel is part of a CMP.  (think BMET  + liver panel = CMP)

## 2016-06-10 ENCOUNTER — Ambulatory Visit (INDEPENDENT_AMBULATORY_CARE_PROVIDER_SITE_OTHER): Payer: BLUE CROSS/BLUE SHIELD | Admitting: *Deleted

## 2016-06-10 ENCOUNTER — Other Ambulatory Visit (INDEPENDENT_AMBULATORY_CARE_PROVIDER_SITE_OTHER): Payer: BLUE CROSS/BLUE SHIELD

## 2016-06-10 DIAGNOSIS — R7301 Impaired fasting glucose: Secondary | ICD-10-CM | POA: Diagnosis not present

## 2016-06-10 DIAGNOSIS — E538 Deficiency of other specified B group vitamins: Secondary | ICD-10-CM

## 2016-06-10 LAB — COMPREHENSIVE METABOLIC PANEL
ALT: 10 U/L (ref 0–35)
AST: 15 U/L (ref 0–37)
Albumin: 4.2 g/dL (ref 3.5–5.2)
Alkaline Phosphatase: 208 U/L — ABNORMAL HIGH (ref 39–117)
BUN: 6 mg/dL (ref 6–23)
CO2: 26 mEq/L (ref 19–32)
Calcium: 9.5 mg/dL (ref 8.4–10.5)
Chloride: 100 mEq/L (ref 96–112)
Creatinine, Ser: 0.56 mg/dL (ref 0.40–1.20)
GFR: 119.7 mL/min (ref >60.00)
Glucose, Bld: 88 mg/dL (ref 70–99)
Potassium: 3.9 mEq/L (ref 3.5–5.1)
Sodium: 132 mEq/L — ABNORMAL LOW (ref 135–145)
Total Bilirubin: 0.3 mg/dL (ref 0.2–1.2)
Total Protein: 7.6 g/dL (ref 6.0–8.3)

## 2016-06-10 LAB — HEMOGLOBIN A1C: Hgb A1c MFr Bld: 6.3 % (ref 4.6–6.5)

## 2016-06-10 MED ORDER — CYANOCOBALAMIN 1000 MCG/ML IJ SOLN
1000.0000 ug | Freq: Once | INTRAMUSCULAR | Status: AC
  Administered 2016-06-10: 1000 ug via INTRAMUSCULAR

## 2016-06-10 NOTE — Progress Notes (Signed)
Patient presented for B 12 injection to right deltoid, patient voiced no concerns during injection and showed no signs of distress.

## 2016-06-14 ENCOUNTER — Other Ambulatory Visit: Payer: Self-pay | Admitting: Internal Medicine

## 2016-06-14 DIAGNOSIS — R748 Abnormal levels of other serum enzymes: Secondary | ICD-10-CM

## 2016-06-14 NOTE — Progress Notes (Signed)
  I have reviewed the above information and agree with above.   Tia Hieronymus, MD 

## 2016-06-16 ENCOUNTER — Other Ambulatory Visit: Payer: Self-pay | Admitting: Internal Medicine

## 2016-06-22 ENCOUNTER — Ambulatory Visit: Payer: BLUE CROSS/BLUE SHIELD

## 2016-06-26 ENCOUNTER — Ambulatory Visit
Admission: RE | Admit: 2016-06-26 | Discharge: 2016-06-26 | Disposition: A | Discharge status: Home / Self Care | Payer: BLUE CROSS/BLUE SHIELD | Source: Ambulatory Visit | Attending: Internal Medicine | Admitting: Internal Medicine

## 2016-06-26 DIAGNOSIS — R748 Abnormal levels of other serum enzymes: Secondary | ICD-10-CM | POA: Diagnosis present

## 2016-06-26 DIAGNOSIS — K760 Fatty (change of) liver, not elsewhere classified: Secondary | ICD-10-CM | POA: Insufficient documentation

## 2016-06-26 DIAGNOSIS — K8020 Calculus of gallbladder without cholecystitis without obstruction: Secondary | ICD-10-CM | POA: Insufficient documentation

## 2016-06-29 ENCOUNTER — Other Ambulatory Visit (INDEPENDENT_AMBULATORY_CARE_PROVIDER_SITE_OTHER): Payer: BLUE CROSS/BLUE SHIELD

## 2016-06-29 DIAGNOSIS — R748 Abnormal levels of other serum enzymes: Secondary | ICD-10-CM

## 2016-06-29 DIAGNOSIS — K760 Fatty (change of) liver, not elsewhere classified: Secondary | ICD-10-CM

## 2016-06-29 LAB — HEPATIC FUNCTION PANEL
ALT: 13 U/L (ref 0–35)
AST: 19 U/L (ref 0–37)
Albumin: 4.2 g/dL (ref 3.5–5.2)
Alkaline Phosphatase: 161 U/L — ABNORMAL HIGH (ref 39–117)
Bilirubin, Direct: 0.1 mg/dL (ref 0.0–0.3)
Total Bilirubin: 0.3 mg/dL (ref 0.2–1.2)
Total Protein: 7.2 g/dL (ref 6.0–8.3)

## 2016-07-02 NOTE — Addendum Note (Signed)
Addended by: Crecencio Mc on: 07/02/2016 04:46 PM   Modules accepted: Orders

## 2016-07-11 ENCOUNTER — Other Ambulatory Visit: Payer: Self-pay | Admitting: Internal Medicine

## 2016-07-16 ENCOUNTER — Ambulatory Visit: Payer: BLUE CROSS/BLUE SHIELD

## 2016-07-31 ENCOUNTER — Other Ambulatory Visit: Payer: Self-pay | Admitting: Internal Medicine

## 2016-07-31 MED ORDER — TRAMADOL HCL 50 MG PO TABS: 50.0000 mg | ORAL_TABLET | Freq: Three times a day (TID) | ORAL | 0 refills | Status: DC

## 2016-07-31 NOTE — Telephone Encounter (Signed)
Patient called for refill on hertraMADol (ULTRAM) 50 MG tablet.

## 2016-07-31 NOTE — Telephone Encounter (Signed)
Refilled: 04/15/2016 Last OV: 04/15/2016 Next OV: 08/04/2016

## 2016-08-03 NOTE — Telephone Encounter (Signed)
Printed, signed and faxed.  

## 2016-08-04 ENCOUNTER — Ambulatory Visit: Payer: BLUE CROSS/BLUE SHIELD

## 2016-08-15 IMAGING — DX DG CHEST 2V
2 series · 2 of 2 positions shown · non-contrast
Comparison: August 24, 2015

CLINICAL DATA: Cough for 1 day

EXAM:
CHEST  2 VIEW

[chest pa]
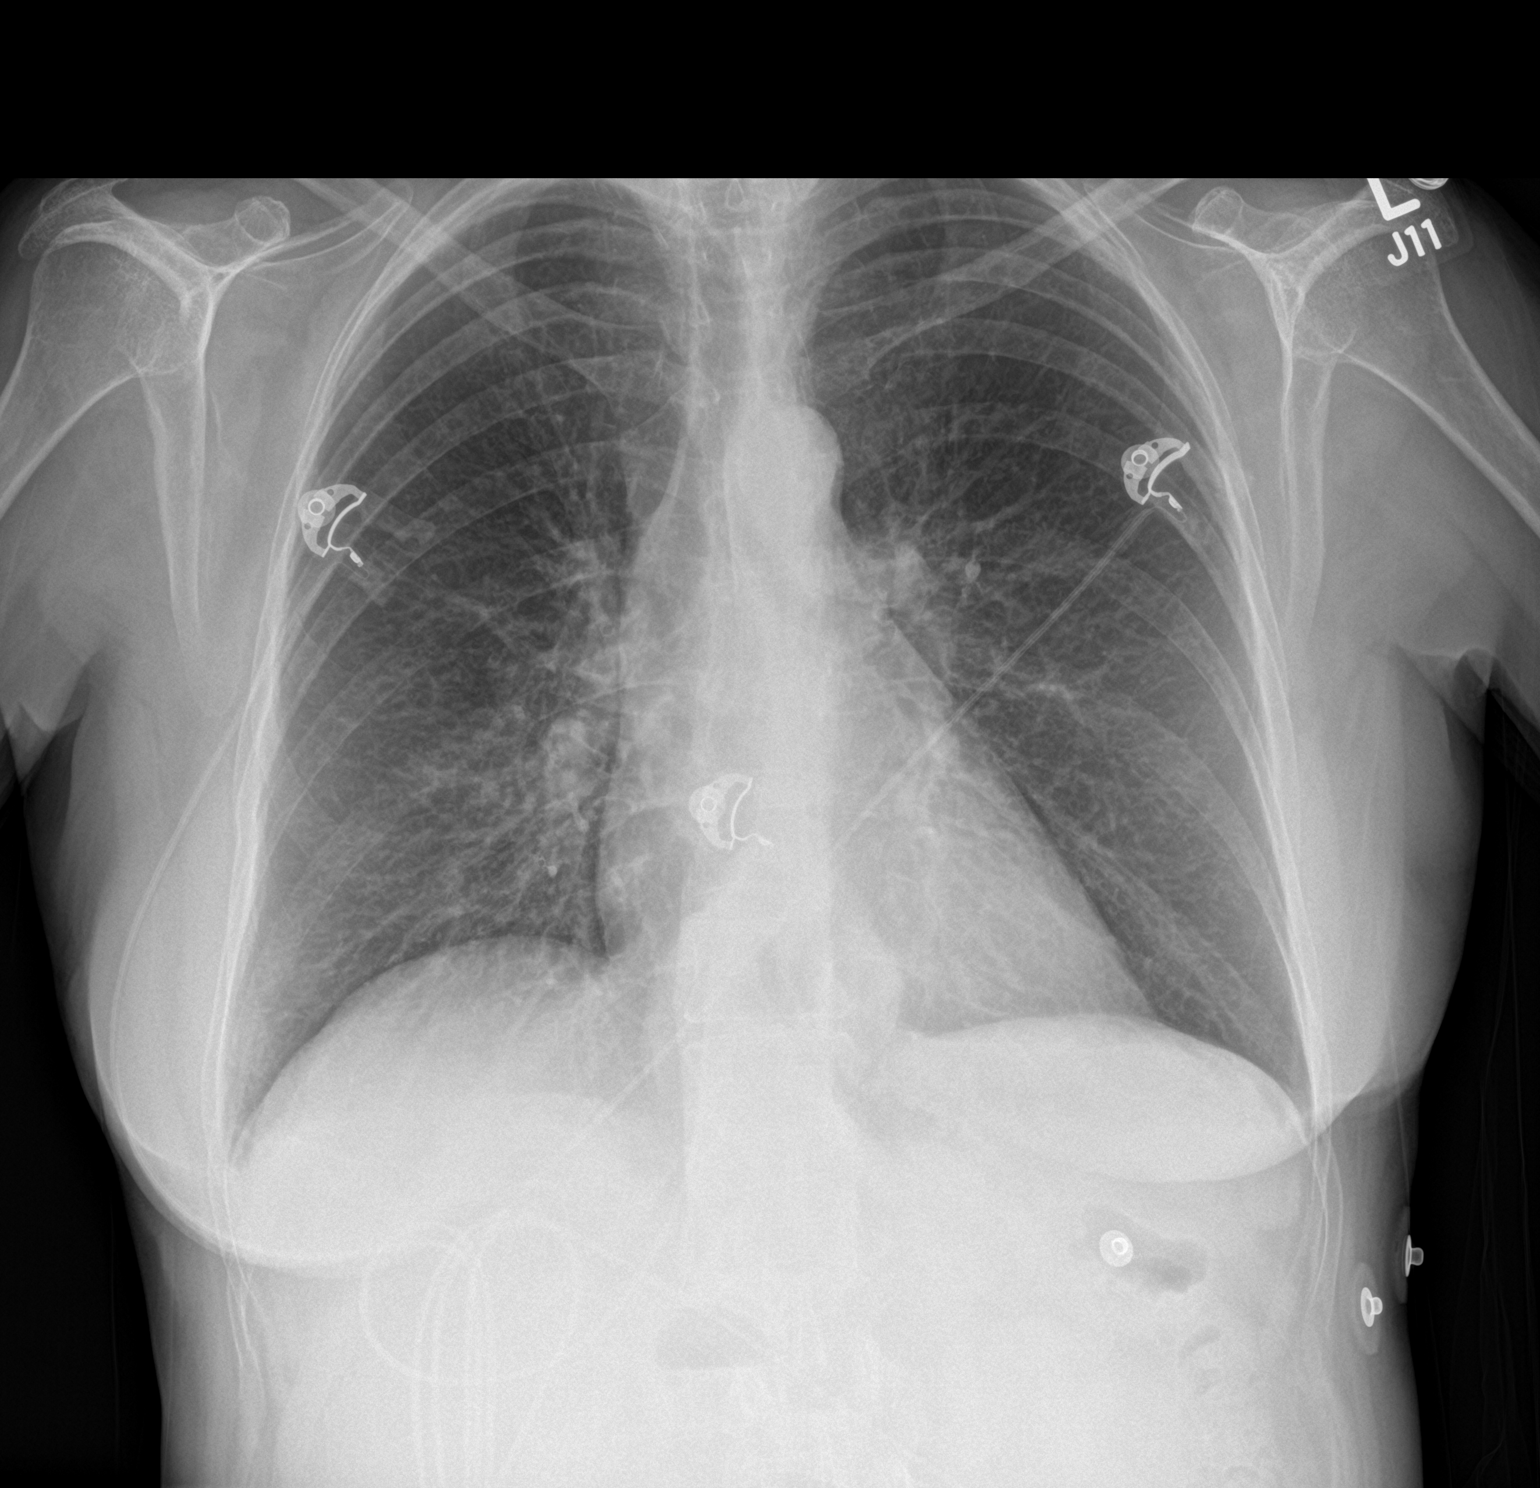

[chest lat]
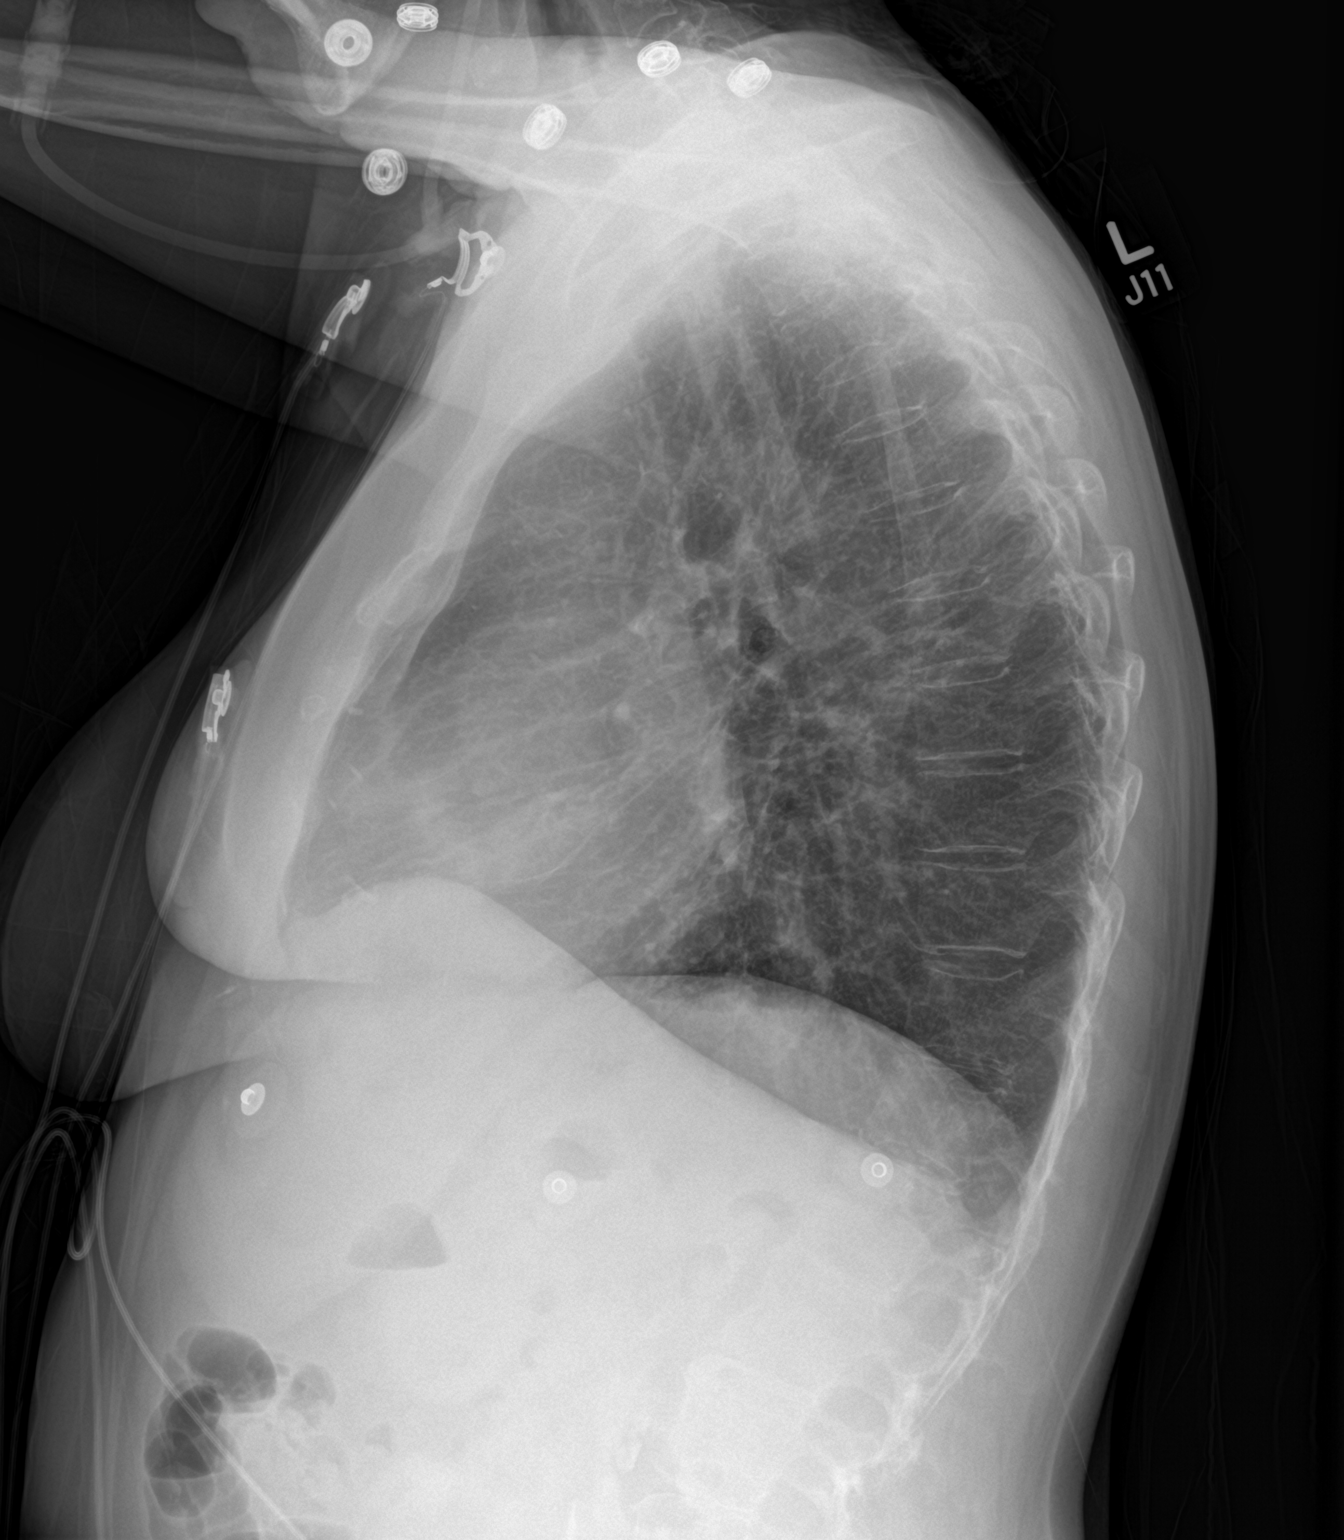

[2 of 2 positions shown; findings below may reference images not displayed]

FINDINGS: There is mild generalized interstitial thickening without frank
edema or consolidation. Heart size and pulmonary vascularity are
normal. No adenopathy. There is a focal hiatal hernia. No bone
lesions.
IMPRESSION: Focal hiatal hernia. Interstitium shows mild generalized thickening
without frank edema or consolidation. This appearance is stable
compared to recent prior study.

## 2016-08-15 IMAGING — CT CT HEAD W/O CM
3 of 4 series · 15 of 47 positions shown, 18 images · non-contrast
Comparison: None.

CLINICAL DATA: Fall

EXAM:
CT HEAD WITHOUT CONTRAST
TECHNIQUE: Contiguous axial images were obtained from the base of the skull
through the vertex without intravenous contrast.

[Series 2: head w/o · axial · non-contrast · 0.45mm/px · z∈[+1422,+1542]mm · 9 of 31 slices shown, 12 images]
[im 4/31  brain]
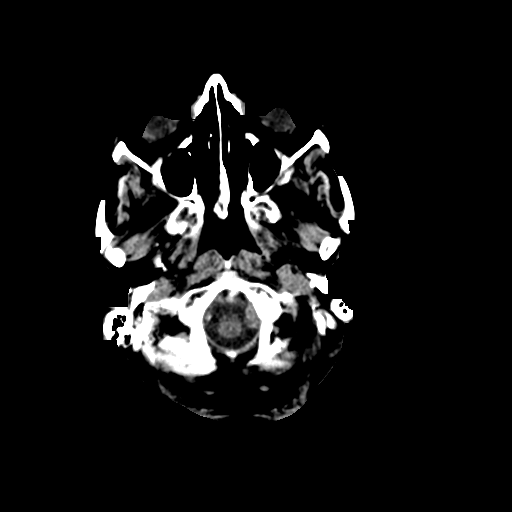
[im 4/31  bone]
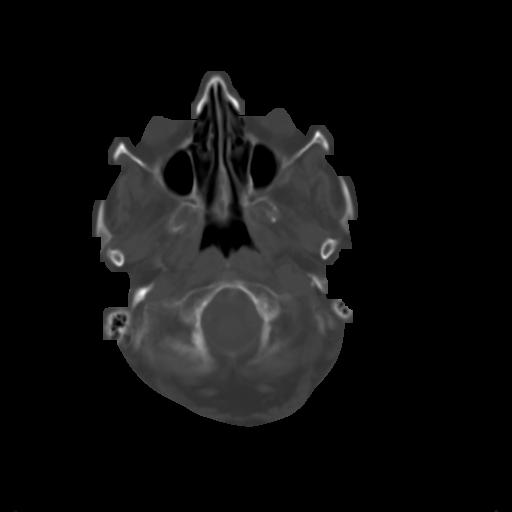
[im 7/31  brain]
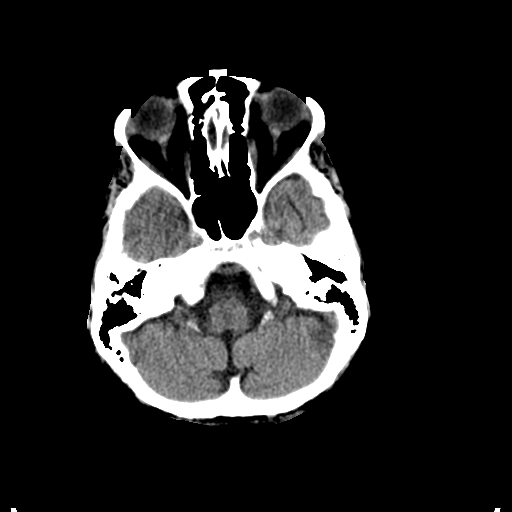
[im 10/31  brain]
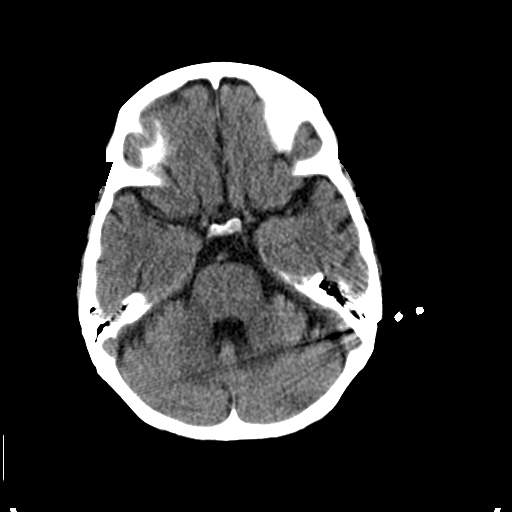
[im 13/31  brain]
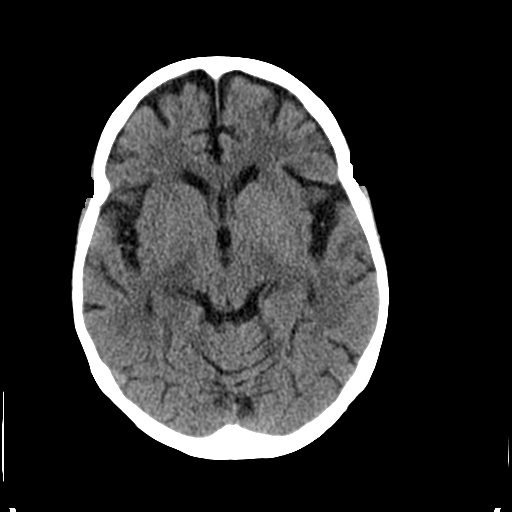
[im 16/31  brain]
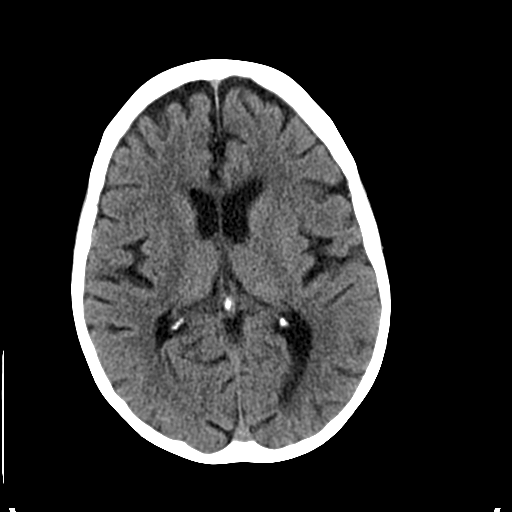
[im 16/31  bone]
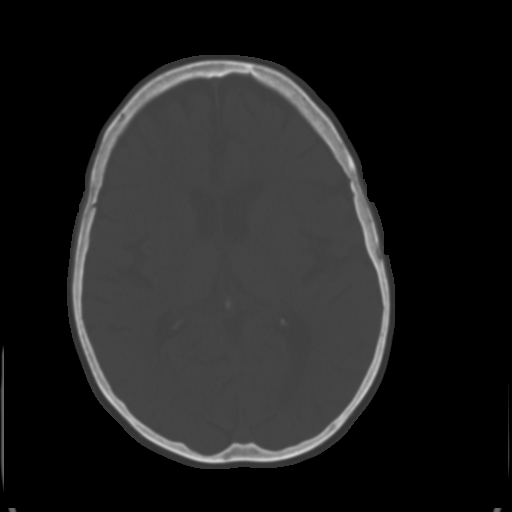
[im 19/31  brain]
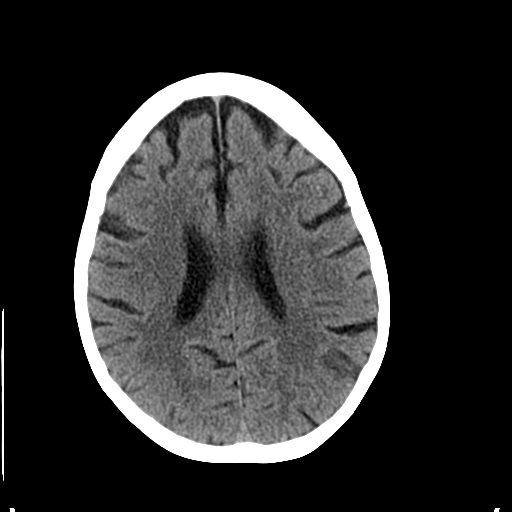
[im 22/31  brain]
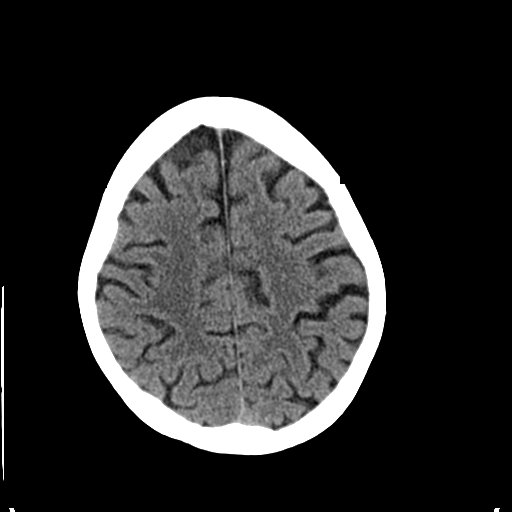
[im 25/31  brain]
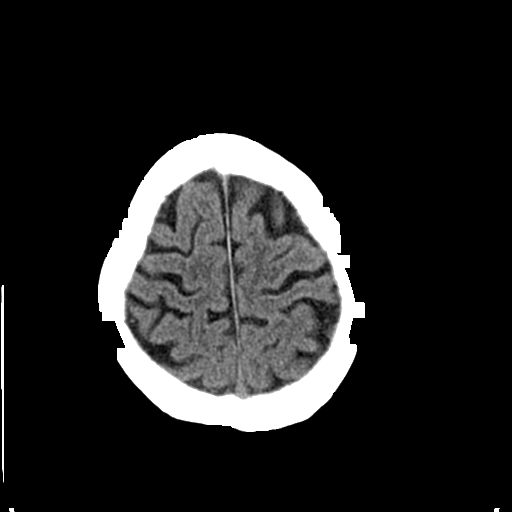
[im 28/31  brain]
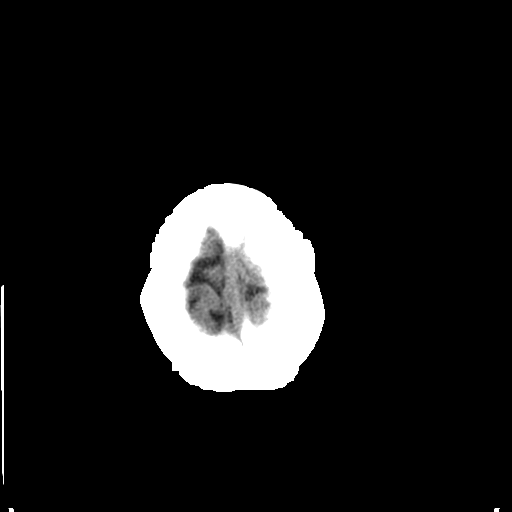
[im 28/31  bone]
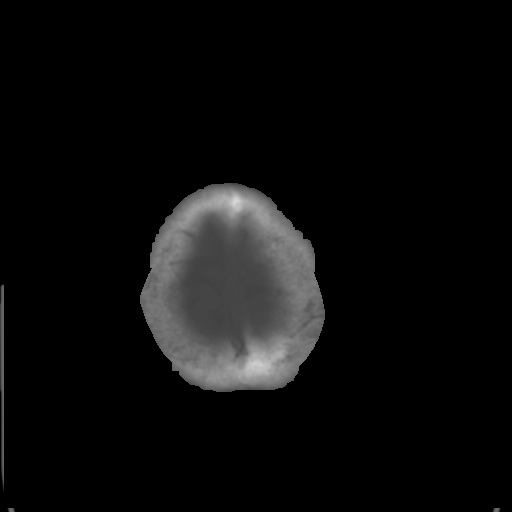

[Series 5: coronal · coronal · 0.29mm/px · 3 of 76 slices shown]
[im 26/76  brain]
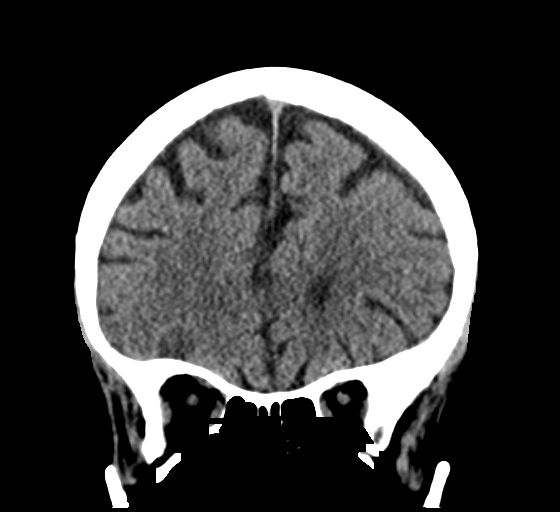
[im 34/76  brain]
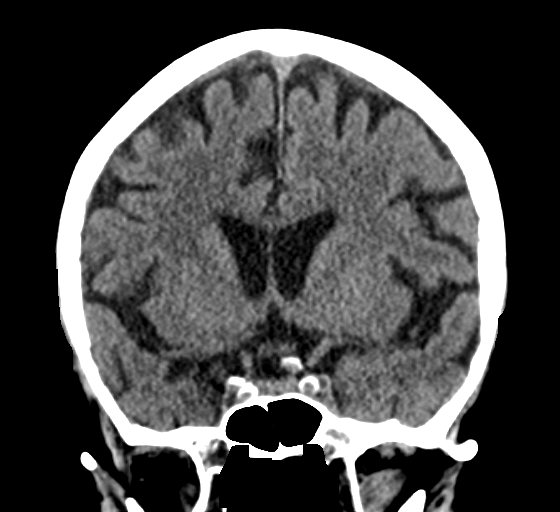
[im 42/76  brain]
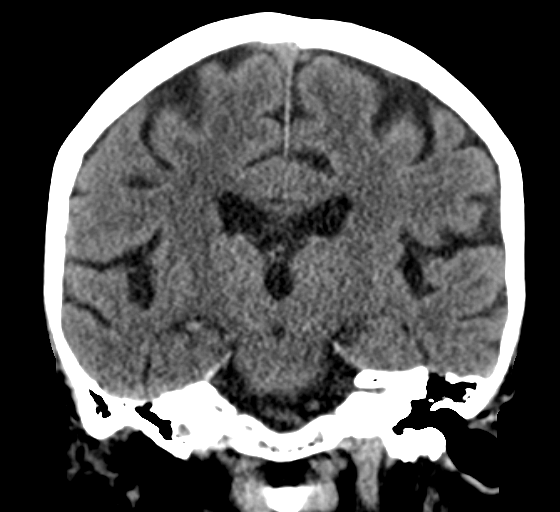

[Series 6: sagittal · sagittal · 0.30mm/px · 3 of 64 slices shown]
[im 22/64  brain]
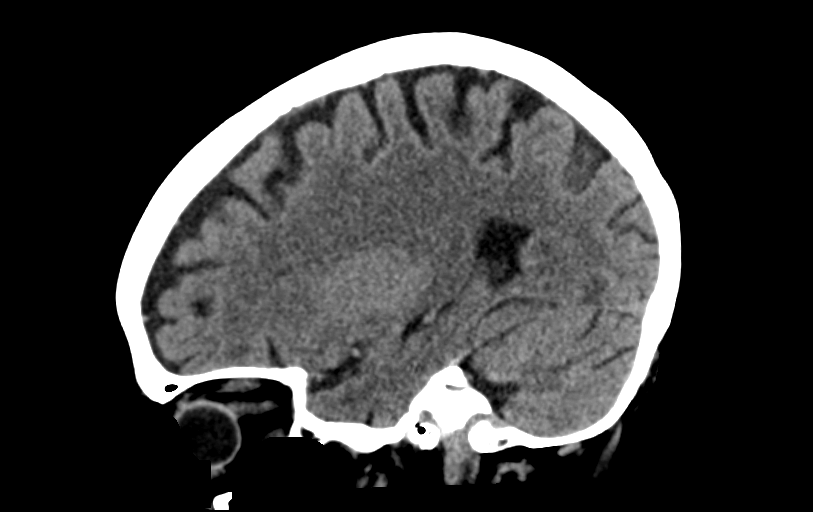
[im 32/64  brain]
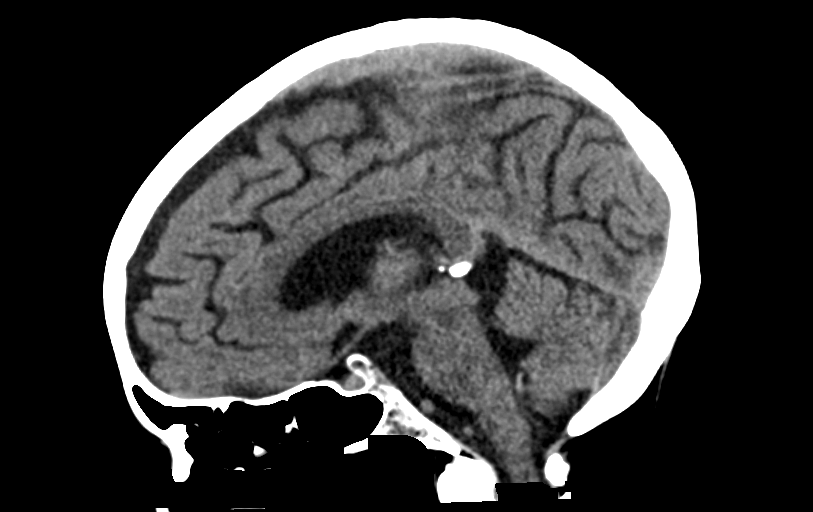
[im 43/64  brain]
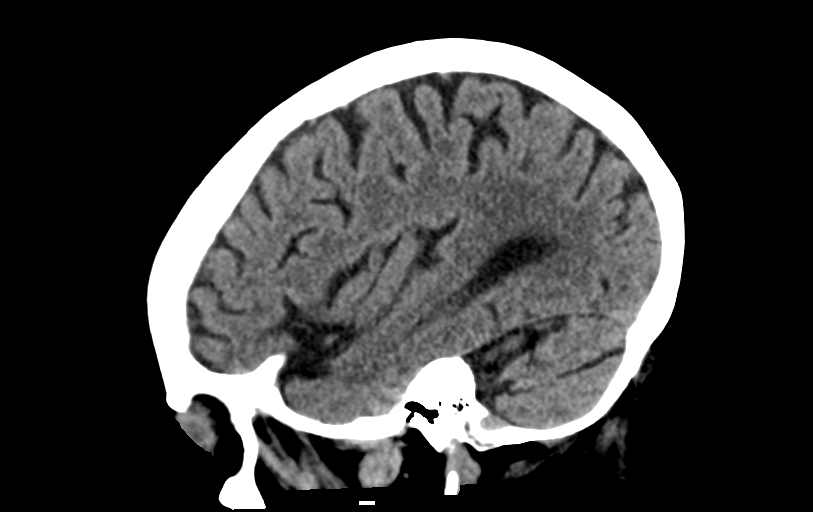

[15 of 47 positions shown; findings below may reference images not displayed]

FINDINGS: No skull fracture is noted. Paranasal sinuses and mastoid air cells
are unremarkable.

No intracranial hemorrhage, mass effect or midline shift.

Mild cerebral atrophy. No acute cortical infarction. No mass lesion
is noted on this unenhanced scan. No hydrocephalus. No intra or
extra-axial fluid collection.
IMPRESSION: No acute intracranial abnormality.  Mild cerebral atrophy.

## 2016-09-01 ENCOUNTER — Telehealth: Payer: Self-pay | Admitting: Internal Medicine

## 2016-09-01 NOTE — Telephone Encounter (Signed)
Pt called and stated that she believes that she has a sinus infection. She states that she has a drainage, lymph nodes swollen, and sore throat. PT states that she is very busy at work an cannot come in. Can we call something in for her? Please advise, thank you!  Call pt @ [place], [place]

## 2016-09-01 NOTE — Telephone Encounter (Signed)
She has a bronchitisi/URI is most likely viral given the mild HEENT Symptoms  And absence  of fevers,  facial pain and purulent sputum  Please explian that in viral URIS, an antibiotic will not help the symptoms and will increase the risk of developing diarrhea.,  Continue oral and nasal decongestants,prn tylenol 650 mq 8 hrs for aches and pains,  Sinus flushes with Milta Deiters Med's rinse,  and a OTC cough suppressant such as Delsym. [name]

## 2016-09-01 NOTE — Telephone Encounter (Signed)
Thanks Kathy,

## 2016-09-01 NOTE — Telephone Encounter (Signed)
Spoke with the patient, symptoms have been getting Progressively worse for approximately two weeks, thought it was allergies, and now feels like she has drainage going to her chest.  Small amount of nasal drainage and slight cough that started in the past few days.  Drainage started out clear now yellow in nature.  Has a Scratchy throat.  Has not taken any OTC medications.  States that Zpacks don't work for her, but in the past has taken Amoxicillin and it works, has one pill at home and with your approval will take.  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

## 2016-09-01 NOTE — Telephone Encounter (Signed)
Patient voiced understanding and will try the OTC medications for a few more days, advised patient if fever of 100.4 or more to call office schedule OV, or if SOB , purulent sputum. Patient stated she will call office.

## 2016-09-29 ENCOUNTER — Ambulatory Visit: Payer: BLUE CROSS/BLUE SHIELD | Admitting: Internal Medicine

## 2016-09-29 ENCOUNTER — Other Ambulatory Visit: Payer: Self-pay | Admitting: Internal Medicine

## 2016-09-29 ENCOUNTER — Telehealth: Payer: Self-pay | Admitting: *Deleted

## 2016-09-29 ENCOUNTER — Other Ambulatory Visit: Payer: BLUE CROSS/BLUE SHIELD

## 2016-09-29 DIAGNOSIS — M545 Low back pain, unspecified: Secondary | ICD-10-CM

## 2016-09-29 NOTE — Telephone Encounter (Signed)
Pt stated she is having intermittent lower back pain that radiates around to lower abdomen and trouble bending over. Also says she has had some frequency. Denies burning with urination. No visible blood in urine and denies urine being cloudy or having foul odor. Denies fever, chills, nausea, vomiting. Pt also stated she has had these similar symptoms before and it ended up being a UTI but was not sure if that is what is wrong this time or if she may have pulled something. Symptoms have been going on for about 24 hours.

## 2016-09-29 NOTE — Telephone Encounter (Signed)
Refilled: 07/31/2016  No refills Last OV: 04/15/2016 Next OV: 10/14/2016

## 2016-09-29 NOTE — Telephone Encounter (Signed)
Patient has requested to come in to give a urine sample for a UTi  Pt contact [phone]

## 2016-09-29 NOTE — Telephone Encounter (Signed)
Last refill without an office visit .

## 2016-09-29 NOTE — Telephone Encounter (Signed)
Have her come in for lab visit  asap for POCT UA, urine micro and urine culture.  Also I have ordered a plain films of her lumbar spine and abdomen .  If she can get these done ASAP,  I WILL SEE HER AT 4:00 AS A WORK IN [employer].

## 2016-09-29 NOTE — Telephone Encounter (Signed)
Patient stated that it is not possible for her to come in today for labs or to be seen and does not think she has done anything to need x-rays. Patient said that she is at work and would call the office back later today or tomorrow to follow up.

## 2016-09-30 NOTE — Telephone Encounter (Signed)
Spoke with pt and she stated that she has an appt to see Dr. Derrel Nip on  10/14/2016.

## 2016-10-01 NOTE — Telephone Encounter (Signed)
rx has been printed, signed and dated.

## 2016-10-14 ENCOUNTER — Ambulatory Visit (INDEPENDENT_AMBULATORY_CARE_PROVIDER_SITE_OTHER): Payer: BLUE CROSS/BLUE SHIELD | Admitting: Internal Medicine

## 2016-10-14 ENCOUNTER — Encounter: Payer: Self-pay | Admitting: Internal Medicine

## 2016-10-14 ENCOUNTER — Other Ambulatory Visit (HOSPITAL_COMMUNITY)
Admission: RE | Admit: 2016-10-14 | Discharge: 2016-10-14 | Disposition: A | Discharge status: Home / Self Care | Payer: BLUE CROSS/BLUE SHIELD | Source: Ambulatory Visit | Attending: Internal Medicine | Admitting: Internal Medicine

## 2016-10-14 VITALS — BP 124/68 | HR 73 | Temp 98.2°F | Resp 15 | Ht 61.0 in | Wt 115.6 lb

## 2016-10-14 DIAGNOSIS — Z124 Encounter for screening for malignant neoplasm of cervix: Secondary | ICD-10-CM | POA: Insufficient documentation

## 2016-10-14 DIAGNOSIS — I471 Supraventricular tachycardia: Secondary | ICD-10-CM

## 2016-10-14 DIAGNOSIS — D510 Vitamin B12 deficiency anemia due to intrinsic factor deficiency: Secondary | ICD-10-CM | POA: Diagnosis not present

## 2016-10-14 DIAGNOSIS — Z0000 Encounter for general adult medical examination without abnormal findings: Secondary | ICD-10-CM

## 2016-10-14 DIAGNOSIS — R7303 Prediabetes: Secondary | ICD-10-CM

## 2016-10-14 DIAGNOSIS — D751 Secondary polycythemia: Secondary | ICD-10-CM | POA: Diagnosis not present

## 2016-10-14 DIAGNOSIS — M810 Age-related osteoporosis without current pathological fracture: Secondary | ICD-10-CM | POA: Diagnosis not present

## 2016-10-14 DIAGNOSIS — E538 Deficiency of other specified B group vitamins: Secondary | ICD-10-CM

## 2016-10-14 DIAGNOSIS — G621 Alcoholic polyneuropathy: Secondary | ICD-10-CM

## 2016-10-14 DIAGNOSIS — Z0001 Encounter for general adult medical examination with abnormal findings: Secondary | ICD-10-CM

## 2016-10-14 DIAGNOSIS — K760 Fatty (change of) liver, not elsewhere classified: Secondary | ICD-10-CM | POA: Diagnosis not present

## 2016-10-14 DIAGNOSIS — F1021 Alcohol dependence, in remission: Secondary | ICD-10-CM | POA: Diagnosis not present

## 2016-10-14 DIAGNOSIS — K2920 Alcoholic gastritis without bleeding: Secondary | ICD-10-CM

## 2016-10-14 DIAGNOSIS — Z8781 Personal history of (healed) traumatic fracture: Secondary | ICD-10-CM

## 2016-10-14 DIAGNOSIS — E559 Vitamin D deficiency, unspecified: Secondary | ICD-10-CM | POA: Diagnosis not present

## 2016-10-14 MED ORDER — CYANOCOBALAMIN 1000 MCG/ML IJ SOLN
1000.0000 ug | Freq: Once | INTRAMUSCULAR | Status: AC
  Administered 2016-10-14: 1000 ug via INTRAMUSCULAR

## 2016-10-14 MED ORDER — CYCLOBENZAPRINE HCL 10 MG PO TABS: 10.0000 mg | ORAL_TABLET | Freq: Three times a day (TID) | ORAL | 1 refills | Status: DC | PRN

## 2016-10-14 NOTE — Patient Instructions (Signed)

## 2016-10-14 NOTE — Progress Notes (Addendum)
Patient ID: Kathy [name], female    DOB: Jun 10, 1960  Age: 56 y.o. MRN: [id number]  The patient is here for annual preventive examination and management of other chronic and acute problems.   Due for PAP,  colonoscopy and TdaP DEXA Dec 2017 osteoporosis T score -3.0  Has not been sexually active for over one year per AA recommendations celebrated one year of sobriety July 2018 with a short cruise  The risk factors are reflected in the social history.  The roster of all physicians providing medical care to patient - is listed in the Snapshot section of the chart.  Activities of daily living:  The patient is 100% independent in all ADLs: dressing, toileting, feeding as well as independent mobility  Home safety : The patient has smoke detectors in the home. They wear seatbelts.  There are no firearms at home. There is no violence in the home.   There is no risks for hepatitis, STDs or HIV. There is no   history of blood transfusion. They have no travel history to infectious disease endemic areas of the world.  The patient has seen their dentist in the last six month. They have seen their eye doctor in the last year.   Discussed the need for sun protection: hats, long sleeves and use of sunscreen if there is significant sun exposure.   Diet: the importance of a healthy diet is discussed. They do have a healthy diet.  The benefits of regular aerobic exercise were discussed. She does not exercise due to neuroapthy affecting feet.   Depression screen: there are no signs or vegative symptoms of depression- irritability, change in appetite, anhedonia, sadness/tearfullness.  Cognitive assessment: the patient manages all their financial and personal affairs and is actively engaged. They could relate day,date,year and events; recalled 2/3 objects at 3 minutes; performed clock-face test normally.  The following portions of the patient's history were reviewed and updated as appropriate:  allergies, current medications, past family history, past medical history,  past surgical history, past social history  and problem list.  Visual acuity was not assessed per patient preference since she has regular follow up with her ophthalmologist. Hearing and body mass index were assessed and reviewed.   During the course of the visit the patient was educated and counseled about appropriate screening and preventive services including : fall prevention , diabetes screening, nutrition counseling, colorectal cancer screening, and recommended immunizations.    CC: The primary encounter diagnosis was Encounter for preventive health examination. Diagnoses of Polycythemia, secondary, Fatty liver, B12 nutritional deficiency, Prediabetes, Cervical cancer screening, Vitamin D deficiency, Paroxysmal supraventricular tachycardia ([place]), History of fracture of femur, Chronic alcoholic gastritis without hemorrhage, History of fracture of left ankle, Alcoholic peripheral neuropathy ([place]), History of alcohol dependence ([place]), Osteoporosis without current pathological fracture, unspecified osteoporosis type, and Pernicious anemia were also pertinent to this visit.   1) Hepatic steatosis noted on abd ultrasound along with gallstones   2) Alcoholism:  Has been Sober since July 11 , 2017 admission to [facility]  During voluntary inpatient stay for alcohol detox.   3) alcoholic polyneuropathy :  She has persistent pain despite resuming use of gabapentin due to blurred vision with Lyrica .  She Has increased dose to  2400 mg daily along with 2-3 tramadol.  Still working at the [employer]; supervisor is sympathetic to her pain and allows her to sit down to rest her feet every 2 to 3 hours.    4) Gastritis/IBS: Bentyl helping her  stomach pains   5) B12 deficiency has not had a b12 injection in 2 months.  Needs one today .  Instrinsic factor not checked yet.   6) Prediabetes:  Discussed a1c 6.3  Has given up soft drinks since  then,  not exercising due to neuropathy being too painful. Artificial sweetners not tolerated.  Uses fit n active lemonade to sweeten her tea      History Lylian has a past medical history of Alcohol abuse; Chicken pox; Colon polyps; Depression; Hernia of abdominal cavity; Hip fracture ([place]) (right); and Neuropathy.   She has no past surgical history on file.   Her family history includes Cancer (age of onset: 22) in her father; Hypertension in her mother.She reports that she has been smoking Cigarettes.  She has been smoking about 0.40 packs per day. She has never used smokeless tobacco. She reports that she does not drink alcohol or use drugs.  Outpatient Medications Prior to Visit  Medication Sig Dispense Refill  . aspirin EC 81 MG tablet Take 81 mg by mouth daily.    . cetirizine (ZYRTEC) 10 MG tablet Take 10 mg by mouth daily.    [name] dicyclomine (BENTYL) 20 MG tablet Take 1 tablet (20 mg total) by mouth every 6 (six) hours as needed for spasms. 332 tablet 5  . folic acid (FOLVITE) 1 MG tablet Take 1 mg by mouth daily.     [name] gabapentin (NEURONTIN) 400 MG capsule Take 2 capsules (800 mg total) by mouth 3 (three) times daily. 180 capsule 5  . omeprazole (PRILOSEC) 40 MG capsule TAKE ONE CAPSULE BY MOUTH TWICE A DAY 180 capsule 0  . traMADol (ULTRAM) 50 MG tablet Take one tablet by mouth three times a day as needed for pain 90 tablet 0  . amitriptyline (ELAVIL) 25 MG tablet Take 1 tablet (25 mg total) by mouth at bedtime. Increase weekly as needed by 25 mg (Patient not taking: Reported on 04/15/2016) 90 tablet 1  . alum & mag hydroxide-simeth (MAALOX/MYLANTA) 200-200-20 MG/5ML suspension Take 30 mLs by mouth every 6 (six) hours as needed for indigestion or heartburn.     . cloNIDine (CATAPRES - DOSED IN MG/24 HR) 0.1 mg/24hr patch Place 0.1 mg onto the skin once a week. Placed 09/08/15    . magnesium oxide (MAG-OX) 400 MG tablet Take 1 tablet (400 mg total) by mouth 2 (two) times daily. (Patient  not taking: Reported on 10/14/2016) 60 tablet 0  . mirtazapine (REMERON) 15 MG tablet Take 1 tablet (15 mg total) by mouth at bedtime. (Patient not taking: Reported on 10/14/2016) 30 tablet 5  . ondansetron (ZOFRAN) 8 MG tablet Take 8 mg by mouth 2 (two) times daily as needed for nausea or vomiting.    . thiamine 100 MG tablet Take 1 tablet (100 mg total) by mouth daily. (Patient not taking: Reported on 10/14/2016)    . Vitamin D, Ergocalciferol, (DRISDOL) 50000 units CAPS capsule TAKE 1 CAPSULE ONCE EVERY 7 DAYS. (Patient not taking: Reported on 10/14/2016) 12 capsule 0   No facility-administered medications prior to visit.     Review of Systems   Patient denies headache, fevers, malaise, unintentional weight loss, skin rash, eye pain, sinus congestion and sinus pain, sore throat, dysphagia,  hemoptysis , cough, dyspnea, wheezing, chest pain, palpitations, orthopnea, edema,  nausea, melena, diarrhea, constipation, flank pain, dysuria, hematuria, urinary  Frequency, nocturia, numbness, tingling, seizures,  Focal weakness, Loss of consciousness,  Tremor, insomnia, depression, anxiety, and suicidal ideation.  Objective:  BP 124/68 (BP Location: Left Arm, Patient Position: Sitting, Cuff Size: Normal)   Pulse 73   Temp 98.2 F (36.8 C) (Oral)   Resp 15   Ht 5\' 1"  (1.549 m)   Wt 115 lb 9.6 oz (52.4 kg)   SpO2 99%   BMI 21.84 kg/m   Physical Exam   General Appearance:    Alert, cooperative, no distress, appears stated age  Head:    Normocephalic, without obvious abnormality, atraumatic  Eyes:    PERRL, conjunctiva/corneas clear, EOM's intact, fundi    benign, both eyes  Ears:    Normal TM's and external ear canals, both ears  Nose:   Nares normal, septum midline, mucosa normal, no drainage    or sinus tenderness  Throat:   Lips, mucosa, and tongue normal; teeth and gums normal  Neck:   Supple, symmetrical, trachea midline, no adenopathy;    thyroid:  no enlargement/tenderness/nodules;  no carotid   bruit or JVD  Back:     Symmetric, no curvature, ROM normal, no CVA tenderness  Lungs:     Clear to auscultation bilaterally, respirations unlabored  Chest Wall:    No tenderness or deformity   Heart:    Regular rate and rhythm, S1 and S2 normal, no murmur, rub   or gallop  Breast Exam:    No tenderness, masses, or nipple abnormality  Abdomen:     Soft, non-tender, bowel sounds active all four quadrants,    no masses, no organomegaly  Genitalia:    Pelvic: cervix normal in appearance, external genitalia normal, no adnexal masses or tenderness, no cervical motion tenderness, rectovaginal septum normal, uterus normal size, shape, and consistency and vagina normal without discharge  Extremities:   Extremities normal, atraumatic, no cyanosis or edema  Pulses:   2+ and symmetric all extremities  Skin:   Skin color, texture, turgor normal, no rashes or lesions  Lymph nodes:   Cervical, supraclavicular, and axillary nodes normal  Neurologic:   CNII-XII intact, normal strength, sensation and reflexes    throughout      Assessment & Plan:   Problem List Items Addressed This Visit    Alcoholic peripheral neuropathy (HCC)    Continue gabapentin tid 2400 mg daily dose and tramadol prn..  Did not tolerate lyrica due to blurred vision       Relevant Medications   cyclobenzaprine (FLEXERIL) 10 MG tablet   Encounter for preventive health examination - Primary    Annual comprehensive preventive exam was done as well as an evaluation and management of chronic conditions .  During the course of the visit the patient was educated and counseled about appropriate screening and preventive services including :  diabetes screening, lipid analysis with projected  10 year  risk for CAD , nutrition counseling, breast, cervical and colorectal cancer screening, and recommended immunizations.  Printed recommendations for health maintenance screenings was given      Fatty liver, alcoholic    By  ultrasound.  Has been alcohol abstinent for over one year.   Lab Results  Component Value Date   ALT 11 10/14/2016   AST 15 10/14/2016   ALKPHOS 153 (H) 10/14/2016   BILITOT 0.3 10/14/2016         Gastritis due to alcohol without hemorrhage    Continue omeprazole and prn bentyl.  Alcohol avoidance       History of alcohol dependence ([place])    She has celebrated one year of sobriety (since July 2017)  History of fracture of femur    Left, nondisplaced, Healed without surgery, managed by  Earnestine Leys.   Osteoporosis noted on dEXA Dec 2017      History of fracture of left ankle    History of left ankle fracture during detox in 2017.  Healed ,  Has osteoporosis seconday to alcohol abuse and low body weight       Osteoporosis    She has a history of alcohol induced gastritis , now  sober,  And continued tobacco abuse, making bisphosphoates and  and raloxifene contraindicated.  Will recommend Prolia as therapy       RESOLVED: Paroxysmal supraventricular tachycardia (HCC)    Improved with alcohol avoidance.       Pernicious anemia    Patient will resume monthly  B12 injections. Her intrinsic factor antibody test is positive.   Lab Results  Component Value Date   VITAMINB12 250 12/30/2015        Relevant Medications   cyanocobalamin ((VITAMIN B-12)) injection 1,000 mcg (Completed)   RESOLVED: Polycythemia, secondary   Relevant Orders   CBC with Differential/Platelet (Completed)   Prediabetes    She has lowered her a1c to 6.1 with reduction in dietary sugar.  Lab Results  Component Value Date   HGBA1C 6.1 10/14/2016         Relevant Orders   Lipid panel (Completed)   Hemoglobin A1c (Completed)    Other Visit Diagnoses    Cervical cancer screening       Relevant Orders   Cytology - PAP (Completed)   Vitamin D deficiency       Relevant Orders   VITAMIN D 25 Hydroxy (Vit-D Deficiency, Fractures) (Completed)      I have discontinued Ms. Baty's  mirtazapine, cloNIDine, alum & mag hydroxide-simeth, ondansetron, thiamine, magnesium oxide, and Vitamin D (Ergocalciferol). I am also having her start on cyclobenzaprine. Additionally, I am having her maintain her cetirizine, aspirin EC, folic acid, amitriptyline, dicyclomine, gabapentin, omeprazole, and traMADol. We administered cyanocobalamin.  Meds ordered this encounter  Medications  . cyclobenzaprine (FLEXERIL) 10 MG tablet    Sig: Take 1 tablet (10 mg total) by mouth 3 (three) times daily as needed for muscle spasms. Involving the legs and feet    Dispense:  90 tablet    Refill:  1  . cyanocobalamin ((VITAMIN B-12)) injection 1,000 mcg    Medications Discontinued During This Encounter  Medication Reason  . alum & mag hydroxide-simeth (MAALOX/MYLANTA) 200-200-20 MG/5ML suspension Patient has not taken in last 30 days  . cloNIDine (CATAPRES - DOSED IN MG/24 HR) 0.1 mg/24hr patch Patient has not taken in last 30 days  . magnesium oxide (MAG-OX) 400 MG tablet Patient has not taken in last 30 days  . mirtazapine (REMERON) 15 MG tablet Patient has not taken in last 30 days  . ondansetron (ZOFRAN) 8 MG tablet Patient has not taken in last 30 days  . thiamine 100 MG tablet Patient has not taken in last 30 days  . Vitamin D, Ergocalciferol, (DRISDOL) 50000 units CAPS capsule Completed Course    Follow-up: Return in about 3 months (around 01/14/2017).   Crecencio Mc, MD

## 2016-10-15 LAB — CBC WITH DIFFERENTIAL/PLATELET
Basophils Absolute: 0.1 10*3/uL (ref 0.0–0.1)
Basophils Relative: 1.4 % (ref 0.0–3.0)
Eosinophils Absolute: 0.1 10*3/uL (ref 0.0–0.7)
Eosinophils Relative: 1.4 % (ref 0.0–5.0)
HCT: 41 % (ref 36.0–46.0)
Hemoglobin: 13.6 g/dL (ref 12.0–15.0)
Lymphocytes Relative: 42.8 % (ref 12.0–46.0)
Lymphs Abs: 4.1 10*3/uL — ABNORMAL HIGH (ref 0.7–4.0)
MCHC: 33.2 g/dL (ref 30.0–36.0)
MCV: 92 fl (ref 78.0–100.0)
Monocytes Absolute: 0.6 10*3/uL (ref 0.1–1.0)
Monocytes Relative: 6.7 % (ref 3.0–12.0)
Neutro Abs: 4.6 10*3/uL (ref 1.4–7.7)
Neutrophils Relative %: 47.7 % (ref 43.0–77.0)
Platelets: 267 10*3/uL (ref 150.0–400.0)
RBC: 4.46 Mil/uL (ref 3.87–5.11)
RDW: 14.3 % (ref 11.5–15.5)
WBC: 9.7 10*3/uL (ref 4.0–10.5)

## 2016-10-15 LAB — COMPREHENSIVE METABOLIC PANEL
ALT: 11 U/L (ref 0–35)
AST: 15 U/L (ref 0–37)
Albumin: 4.4 g/dL (ref 3.5–5.2)
Alkaline Phosphatase: 153 U/L — ABNORMAL HIGH (ref 39–117)
BUN: 8 mg/dL (ref 6–23)
CO2: 29 mEq/L (ref 19–32)
Calcium: 9.6 mg/dL (ref 8.4–10.5)
Chloride: 96 mEq/L (ref 96–112)
Creatinine, Ser: 0.64 mg/dL (ref 0.40–1.20)
GFR: 102.47 mL/min (ref >60.00)
Glucose, Bld: 77 mg/dL (ref 70–99)
Potassium: 4.1 mEq/L (ref 3.5–5.1)
Sodium: 132 mEq/L — ABNORMAL LOW (ref 135–145)
Total Bilirubin: 0.3 mg/dL (ref 0.2–1.2)
Total Protein: 7.6 g/dL (ref 6.0–8.3)

## 2016-10-15 LAB — LIPID PANEL
Cholesterol: 161 mg/dL (ref 0–200)
HDL: 53.7 mg/dL (ref >39.00)
LDL Cholesterol: 81 mg/dL (ref 0–99)
NonHDL: 107.43
Total CHOL/HDL Ratio: 3
Triglycerides: 131 mg/dL (ref 0.0–149.0)
VLDL: 26.2 mg/dL (ref 0.0–40.0)

## 2016-10-15 LAB — CYTOLOGY - PAP
Diagnosis: NEGATIVE
HPV: NOT DETECTED

## 2016-10-15 LAB — HEMOGLOBIN A1C: Hgb A1c MFr Bld: 6.1 % (ref 4.6–6.5)

## 2016-10-15 LAB — VITAMIN D 25 HYDROXY (VIT D DEFICIENCY, FRACTURES): VITD: 43.27 ng/mL (ref 30.00–100.00)

## 2016-10-16 LAB — INTRINSIC FACTOR ANTIBODIES: Intrinsic Factor: POSITIVE — AB

## 2016-10-17 DIAGNOSIS — M810 Age-related osteoporosis without current pathological fracture: Secondary | ICD-10-CM | POA: Insufficient documentation

## 2016-10-17 DIAGNOSIS — R7303 Prediabetes: Secondary | ICD-10-CM | POA: Insufficient documentation

## 2016-10-17 NOTE — Assessment & Plan Note (Signed)
She has lowered her a1c to 6.1 with reduction in dietary sugar.  Lab Results  Component Value Date   HGBA1C 6.1 10/14/2016

## 2016-10-17 NOTE — Assessment & Plan Note (Signed)
Continue omeprazole and prn bentyl.  Alcohol avoidance

## 2016-10-17 NOTE — Assessment & Plan Note (Signed)
Annual comprehensive preventive exam was done as well as an evaluation and management of chronic conditions .  During the course of the visit the patient was educated and counseled about appropriate screening and preventive services including :  diabetes screening, lipid analysis with projected  10 year  risk for CAD , nutrition counseling, breast, cervical and colorectal cancer screening, and recommended immunizations.  Printed recommendations for health maintenance screenings was given 

## 2016-10-17 NOTE — Assessment & Plan Note (Signed)
Continue gabapentin tid 2400 mg daily dose and tramadol prn..  Did not tolerate lyrica due to blurred vision

## 2016-10-17 NOTE — Assessment & Plan Note (Signed)
By ultrasound.  Has been alcohol abstinent for over one year.   Lab Results  Component Value Date   ALT 11 10/14/2016   AST 15 10/14/2016   ALKPHOS 153 (H) 10/14/2016   BILITOT 0.3 10/14/2016

## 2016-10-17 NOTE — Assessment & Plan Note (Signed)
Improved with alcohol avoidance.

## 2016-10-17 NOTE — Assessment & Plan Note (Addendum)
Left, nondisplaced, Healed without surgery, managed by  Earnestine Leys.   Osteoporosis noted on dEXA Dec 2017

## 2016-10-17 NOTE — Assessment & Plan Note (Signed)
She has celebrated one year of sobriety (since July 2017)

## 2016-10-17 NOTE — Assessment & Plan Note (Addendum)
Patient will resume monthly  B12 injections. Her intrinsic factor antibody test is positive.   Lab Results  Component Value Date   VITAMINB12 250 12/30/2015

## 2016-10-17 NOTE — Assessment & Plan Note (Addendum)
History of left ankle fracture during detox in 2017.  Healed ,  Has osteoporosis seconday to alcohol abuse and low body weight

## 2016-10-17 NOTE — Assessment & Plan Note (Signed)
She has a history of alcohol induced gastritis , now  sober,  And continued tobacco abuse, making bisphosphoates and  and raloxifene contraindicated.  Will recommend Prolia as therapy

## 2016-10-21 ENCOUNTER — Ambulatory Visit: Payer: BLUE CROSS/BLUE SHIELD

## 2016-10-27 ENCOUNTER — Ambulatory Visit: Payer: BLUE CROSS/BLUE SHIELD

## 2016-11-02 ENCOUNTER — Other Ambulatory Visit: Payer: Self-pay | Admitting: Internal Medicine

## 2016-11-02 NOTE — Telephone Encounter (Signed)
Tramadol refill printed

## 2016-11-02 NOTE — Telephone Encounter (Signed)
Pt called about not receiving the medication.   Pharmacy is [facility], [place]  Call pt @ [phone]. Thank you!

## 2016-11-02 NOTE — Telephone Encounter (Signed)
Pt called requesting this refill. Pt states that she has been out of medication and needs this today. Please advise, thank you!

## 2016-11-02 NOTE — Telephone Encounter (Signed)
Last OV 10/14/2016 Next OV not scheduled Last refill 09/29/2016

## 2016-11-02 NOTE — Telephone Encounter (Signed)
rx sent to pharmacy.  Pt aware.  

## 2016-11-05 ENCOUNTER — Ambulatory Visit (INDEPENDENT_AMBULATORY_CARE_PROVIDER_SITE_OTHER): Payer: BLUE CROSS/BLUE SHIELD | Admitting: *Deleted

## 2016-11-05 DIAGNOSIS — E538 Deficiency of other specified B group vitamins: Secondary | ICD-10-CM | POA: Diagnosis not present

## 2016-11-05 MED ORDER — CYANOCOBALAMIN 1000 MCG/ML IJ SOLN
1000.0000 ug | Freq: Once | INTRAMUSCULAR | Status: AC
  Administered 2016-11-05: 1000 ug via INTRAMUSCULAR

## 2016-11-05 NOTE — Telephone Encounter (Signed)
Printed, signed and faxed.  

## 2016-11-05 NOTE — Progress Notes (Addendum)
Patient presented for B 12 injection to right deltoid, patient voiced no concerns nor showed any signs of distress during injection.  Reviewed.  Dr Scott 

## 2016-12-23 ENCOUNTER — Telehealth: Payer: Self-pay | Admitting: Internal Medicine

## 2016-12-23 NOTE — Telephone Encounter (Signed)
Pt would like a phone call back in regards to her last appt

## 2016-12-25 ENCOUNTER — Telehealth: Payer: Self-pay

## 2016-12-25 ENCOUNTER — Telehealth: Payer: Self-pay | Admitting: Internal Medicine

## 2016-12-25 NOTE — Telephone Encounter (Signed)
Left message on pt.'s phone - can't find folic acid on her medication list.

## 2016-12-25 NOTE — Telephone Encounter (Signed)
Copied from [place] [id number]. Topic: Quick Communication - See Telephone Encounter >> Dec 25, 2016 11:54 AM Conception Chancy, NT wrote: CRM for notification. See Telephone encounter for:  12/25/16.  Pt is calling she has contacted pharmacy and she is out of refills for her pholicacid.

## 2016-12-25 NOTE — Telephone Encounter (Signed)
Left message for patient to call office.  

## 2016-12-25 NOTE — Telephone Encounter (Signed)
Patient is being evaluated by medicare for disability and she would like to make her DX of Neuropathy and any other DX is in her chart.

## 2016-12-25 NOTE — Telephone Encounter (Signed)
Copied from [place] #[id number]. Topic: Inquiry >> Dec 25, 2016 11:55 AM Conception Chancy, NT wrote: Reason for CRM: pt is calling to let [place] know disability has called and will present medical records to their physicians, please check chart and make sure everything is in about her conditions esp the neurothopy. Mikael has talked to Dr. Derrel Nip about this and it is very important that her diagnosis is in there.

## 2016-12-28 NOTE — Telephone Encounter (Signed)
See recent phone note 

## 2016-12-28 NOTE — Telephone Encounter (Signed)
It is

## 2016-12-29 ENCOUNTER — Telehealth: Payer: Self-pay | Admitting: *Deleted

## 2016-12-29 NOTE — Telephone Encounter (Signed)
Received referral for low dose lung cancer screening CT scan. Message left at phone number listed in EMR for patient to call me back to facilitate scheduling scan.  

## 2017-01-12 ENCOUNTER — Telehealth: Payer: Self-pay | Admitting: *Deleted

## 2017-01-12 DIAGNOSIS — Z122 Encounter for screening for malignant neoplasm of respiratory organs: Secondary | ICD-10-CM

## 2017-01-12 DIAGNOSIS — Z87891 Personal history of nicotine dependence: Secondary | ICD-10-CM

## 2017-01-12 NOTE — Telephone Encounter (Signed)
Received referral for initial lung cancer screening scan. Contacted patient and obtained smoking history,(current, 30 pack year) as well as answering questions related to screening process. Patient denies signs of lung cancer such as weight loss or hemoptysis. Patient denies comorbidity that would prevent curative treatment if lung cancer were found. Patient is scheduled for shared decision making visit and CT scan on 01/26/17.

## 2017-01-25 ENCOUNTER — Other Ambulatory Visit: Payer: Self-pay | Admitting: Internal Medicine

## 2017-01-25 ENCOUNTER — Encounter: Payer: Self-pay | Admitting: *Deleted

## 2017-01-25 ENCOUNTER — Telehealth: Payer: Self-pay

## 2017-01-25 NOTE — Telephone Encounter (Signed)
Copied from [id number]. Topic: Quick Communication - See Telephone Encounter  >> Jan 25, 2017  1:20 PM Yvette Rack wrote: Patient stating that she had been following the correct steps to get her medicine refilled and that she had called today and medicine still isn't at pharmacy she would like for someone to fax over her Tramadol 50 mg which she has 6 pills left and her Gabapentin 800 mg she is out need to take today she would like to have refills on these medicines so she wouldn't have top keep calling back please send to [facility]

## 2017-01-25 NOTE — Telephone Encounter (Signed)
Gabapentin  Refilled: 05/11/2016  Last OV: 10/14/2016  Next OV: not scheduled  Tramadol   Refilled: 11/02/2016  Last OV: 10/14/2016  Next OV: not scheduled

## 2017-01-26 ENCOUNTER — Ambulatory Visit: Payer: BLUE CROSS/BLUE SHIELD

## 2017-01-26 ENCOUNTER — Inpatient Hospital Stay: Payer: BLUE CROSS/BLUE SHIELD | Attending: Oncology | Admitting: Nurse Practitioner

## 2017-01-26 NOTE — Telephone Encounter (Signed)
rxs have been faxed and pt has been notified.

## 2017-01-27 ENCOUNTER — Telehealth: Payer: Self-pay | Admitting: *Deleted

## 2017-01-27 ENCOUNTER — Ambulatory Visit: Payer: BLUE CROSS/BLUE SHIELD

## 2017-01-27 NOTE — Telephone Encounter (Signed)
Left voicemail in attempt to reschedule patient for lung screening ct no show appointment.

## 2017-01-27 NOTE — Telephone Encounter (Signed)
Printed, signed and faxed. Pt is aware.

## 2017-02-03 ENCOUNTER — Encounter: Payer: Self-pay | Admitting: *Deleted

## 2017-02-22 ENCOUNTER — Telehealth: Payer: Self-pay | Admitting: Internal Medicine

## 2017-02-22 ENCOUNTER — Other Ambulatory Visit: Payer: Self-pay | Admitting: Internal Medicine

## 2017-02-22 NOTE — Telephone Encounter (Signed)
Copied from [place] [id number]. Topic: Quick Communication - Rx Refill/Question >> Feb 22, 2017 10:12 AM Yvette Rack wrote: Has the patient contacted their pharmacy? No.   (Agent: If no, request that the patient contact the pharmacy for the refill.) traMADol (ULTRAM) 50 MG tablet    Preferred Pharmacy (with phone number or street name): [place], [place] - [address] [phone] (Phone) [phone] (Fax)     Agent: Please be advised that RX refills may take up to 3 business days. We ask that you follow-up with your pharmacy.

## 2017-02-22 NOTE — Telephone Encounter (Signed)
Spoke with pharmacy and they stated that the pt does still have a refill there that she may pick up. Notified the pt of this.

## 2017-02-25 ENCOUNTER — Telehealth: Payer: Self-pay

## 2017-02-25 NOTE — Telephone Encounter (Signed)
Please call and schedule

## 2017-02-25 NOTE — Telephone Encounter (Signed)
No,  Just have her resume 3 weekly injections ASAP,,,  Then monthly

## 2017-02-25 NOTE — Telephone Encounter (Signed)
Copied from [place] [id number]. Topic: General - Other >> Feb 25, 2017  2:20 PM Conception Chancy, NT wrote: Reason for CRM: patient is calling and wanting to get a B12 shot. States she has missed several months. Last one was in Sept 2018. Please advise and contact patient with appt.

## 2017-02-25 NOTE — Telephone Encounter (Signed)
Patient is wanting to schedule a b12 injection. She has not had one since 10/2016. Would you like for her to come in and have her levels checked prior to getting the shot?

## 2017-03-03 ENCOUNTER — Ambulatory Visit: Payer: BLUE CROSS/BLUE SHIELD

## 2017-03-09 ENCOUNTER — Ambulatory Visit (INDEPENDENT_AMBULATORY_CARE_PROVIDER_SITE_OTHER): Payer: BLUE CROSS/BLUE SHIELD | Admitting: *Deleted

## 2017-03-09 DIAGNOSIS — E538 Deficiency of other specified B group vitamins: Secondary | ICD-10-CM | POA: Diagnosis not present

## 2017-03-09 MED ORDER — CYANOCOBALAMIN 1000 MCG/ML IJ SOLN
1000.0000 ug | Freq: Once | INTRAMUSCULAR | Status: AC
Start: 2017-03-09 — End: 2017-03-09
  Administered 2017-03-09: 1000 ug via INTRAMUSCULAR

## 2017-03-09 NOTE — Progress Notes (Addendum)
Patient presented for B 12 injection to left deltoid, patient voiced no concerns nor showed any signs of distress during injection   I have reviewed the above information and agree with above.   Teresa Tullo, MD 

## 2017-03-10 ENCOUNTER — Ambulatory Visit: Payer: BLUE CROSS/BLUE SHIELD

## 2017-03-16 ENCOUNTER — Ambulatory Visit (INDEPENDENT_AMBULATORY_CARE_PROVIDER_SITE_OTHER): Payer: BLUE CROSS/BLUE SHIELD | Admitting: *Deleted

## 2017-03-16 DIAGNOSIS — E538 Deficiency of other specified B group vitamins: Secondary | ICD-10-CM

## 2017-03-16 MED ORDER — CYANOCOBALAMIN 1000 MCG/ML IJ SOLN
1000.0000 ug | Freq: Once | INTRAMUSCULAR | Status: AC
  Administered 2017-03-16: 1000 ug via INTRAMUSCULAR

## 2017-03-16 NOTE — Progress Notes (Signed)
Patient presented for B 12 injection to right deltoid, patient voiced no concerns nor showed any signs of distress during injection. 

## 2017-03-17 ENCOUNTER — Ambulatory Visit: Payer: BLUE CROSS/BLUE SHIELD

## 2017-03-18 ENCOUNTER — Encounter: Payer: Self-pay | Admitting: *Deleted

## 2017-03-22 ENCOUNTER — Other Ambulatory Visit: Payer: Self-pay | Admitting: Internal Medicine

## 2017-03-22 MED ORDER — OMEPRAZOLE 40 MG PO CPDR: 40.0000 mg | DELAYED_RELEASE_CAPSULE | Freq: Two times a day (BID) | ORAL | 0 refills | Status: DC

## 2017-03-22 NOTE — Telephone Encounter (Signed)
Copied from [place]. Topic: Quick Communication - Rx Refill/Question >> Mar 22, 2017 11:32 AM Aurelio Brash B wrote: Medication: omeprazole (PRILOSEC) 40 MG capsule   Has the patient contacted their pharmacy? yes   (Agent: If no, request that the patient contact the pharmacy for the refill.)   Preferred Pharmacy (with phone number or street name): [place], [place] - [place] [phone] (Phone) [phone] (Fax)     Agent: Please be advised that RX refills may take up to 3 business days. We ask that you follow-up with your pharmacy.

## 2017-03-22 NOTE — Telephone Encounter (Signed)
Refilled: 01/25/2017 Last OV: 10/14/2016 Next OV: 04/05/2017

## 2017-03-23 ENCOUNTER — Telehealth: Payer: Self-pay | Admitting: Internal Medicine

## 2017-03-23 ENCOUNTER — Ambulatory Visit: Payer: BLUE CROSS/BLUE SHIELD

## 2017-03-23 MED ORDER — TRAMADOL HCL 50 MG PO TABS: 50.0000 mg | ORAL_TABLET | Freq: Four times a day (QID) | ORAL | 0 refills | Status: DC | PRN

## 2017-03-23 NOTE — Telephone Encounter (Signed)
THIS WAS APPROVED YESTERDAY.  DID IT GO?  I WILL PRINT IT AGAIN

## 2017-03-23 NOTE — Addendum Note (Signed)
Addended by: Crecencio Mc on: 03/23/2017 05:23 PM   Modules accepted: Orders

## 2017-03-23 NOTE — Telephone Encounter (Signed)
Tramadol Rx faxed to [facility]

## 2017-03-23 NOTE — Telephone Encounter (Signed)
Patient wants this sent to [facility].

## 2017-04-05 ENCOUNTER — Ambulatory Visit (INDEPENDENT_AMBULATORY_CARE_PROVIDER_SITE_OTHER): Payer: BLUE CROSS/BLUE SHIELD | Admitting: Internal Medicine

## 2017-04-05 ENCOUNTER — Encounter: Payer: Self-pay | Admitting: Internal Medicine

## 2017-04-05 VITALS — BP 118/72 | HR 78 | Temp 98.0°F | Resp 15 | Ht 61.0 in | Wt 113.4 lb

## 2017-04-05 DIAGNOSIS — E538 Deficiency of other specified B group vitamins: Secondary | ICD-10-CM

## 2017-04-05 DIAGNOSIS — G621 Alcoholic polyneuropathy: Secondary | ICD-10-CM

## 2017-04-05 DIAGNOSIS — F1021 Alcohol dependence, in remission: Secondary | ICD-10-CM | POA: Diagnosis not present

## 2017-04-05 DIAGNOSIS — R7303 Prediabetes: Secondary | ICD-10-CM | POA: Diagnosis not present

## 2017-04-05 DIAGNOSIS — D510 Vitamin B12 deficiency anemia due to intrinsic factor deficiency: Secondary | ICD-10-CM

## 2017-04-05 DIAGNOSIS — M810 Age-related osteoporosis without current pathological fracture: Secondary | ICD-10-CM | POA: Diagnosis not present

## 2017-04-05 MED ORDER — TRAMADOL HCL 50 MG PO TABS: 50.0000 mg | ORAL_TABLET | Freq: Four times a day (QID) | ORAL | 5 refills | Status: DC | PRN

## 2017-04-05 MED ORDER — CYANOCOBALAMIN 1000 MCG/ML IJ SOLN
1000.0000 ug | Freq: Once | INTRAMUSCULAR | Status: AC
  Administered 2017-04-05: 1000 ug via INTRAMUSCULAR

## 2017-04-05 NOTE — Progress Notes (Signed)
Subjective:  Patient ID: Kathy [name], female    DOB: November 07, 1960  Age: 57 y.o. MRN: [id number]  CC: The primary encounter diagnosis was B12 deficiency. Diagnoses of Prediabetes, Pernicious anemia, History of alcohol dependence ([place]), Alcoholic peripheral neuropathy ([place]), and Osteoporosis without current pathological fracture, unspecified osteoporosis type were also pertinent to this visit.  HPI [name] presents for follow up on prediabetes,   chronic pain secondary to alcoholic neuropathy, and history of alcohol abuse.   She remains sober and abstinent for over 1.5 years .  She is having trouble with depression due to financial worries,   Remains  unable to work > 20 hours per week  due to painful disabling neuropathy ,only marginally controlled with tramadol .  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 Even sitting for periods freather than 30 minutes result in worsenign symptoms in legs  Requiring frequent breaks.     Outpatient Medications Prior to Visit  Medication Sig Dispense Refill  . amitriptyline (ELAVIL) 25 MG tablet Take 1 tablet (25 mg total) by mouth at bedtime. Increase weekly as needed by 25 mg 90 tablet 1  . cetirizine (ZYRTEC) 10 MG tablet Take 10 mg by mouth daily.    [name] dicyclomine (BENTYL) 20 MG tablet Take 1 tablet (20 mg total) by mouth every 6 (six) hours as needed for spasms. 120 tablet 5  . gabapentin (NEURONTIN) 400 MG capsule Take 2 capsules (800 mg total) by mouth 3 (three) times daily. 180 capsule 4  . omeprazole (PRILOSEC) 40 MG capsule Take 1 capsule (40 mg total) by mouth 2 (two) times daily. 180 capsule 0  . traMADol (ULTRAM) 50 MG tablet Take 1 tablet (50 mg total) by mouth every 6 (six) hours as needed. 90 tablet 0  . aspirin EC 81 MG tablet Take 81 mg by mouth daily.    . cyclobenzaprine (FLEXERIL) 10 MG tablet Take 1 tablet (10 mg total) by mouth 3 (three) times daily as needed for muscle spasms. Involving the  legs and feet (Patient not taking: Reported on 04/05/2017) 90 tablet 1   No facility-administered medications prior to visit.     Review of Systems;  Patient denies headache, fevers, malaise, unintentional weight loss, skin rash, eye pain, sinus congestion and sinus pain, sore throat, dysphagia,  hemoptysis , cough, dyspnea, wheezing, chest pain, palpitations, orthopnea, edema, abdominal pain, nausea, melena, diarrhea, constipation, flank pain, dysuria, hematuria, urinary  Frequency, nocturia, numbness, tingling, seizures,  Focal weakness, Loss of consciousness,  Tremor, insomnia, depression, anxiety, and suicidal ideation.      Objective:  BP 118/72 (BP Location: Left Arm, Patient Position: Sitting, Cuff Size: Normal)   Pulse 78   Temp 98 F (36.7 C) (Oral)   Resp 15   Ht 5\' 1"  (1.549 m)   Wt 113 lb 6.4 oz (51.4 kg)   SpO2 98%   BMI 21.43 kg/m   BP Readings from Last 3 Encounters:  04/05/17 118/72  10/14/16 124/68  04/15/16 138/80    Wt Readings from Last 3 Encounters:  04/05/17 113 lb 6.4 oz (51.4 kg)  10/14/16 115 lb 9.6 oz (52.4 kg)  04/15/16 116 lb (52.6 kg)    General appearance: alert, cooperative and appears stated age Ears: normal TM's and external ear canals both ears Throat: lips, mucosa, and tongue normal; teeth and gums normal Neck: no adenopathy, no carotid bruit, supple, symmetrical, trachea midline and thyroid not enlarged, symmetric, no tenderness/mass/nodules Back: symmetric,  no curvature. ROM normal. No CVA tenderness. Lungs: clear to auscultation bilaterally Heart: regular rate and rhythm, S1, S2 normal, no murmur, click, rub or gallop Abdomen: soft, non-tender; bowel sounds normal; no masses,  no organomegaly Pulses: 2+ and symmetric Skin: Skin color, texture, turgor normal. No rashes or lesions Lymph nodes: Cervical, supraclavicular, and axillary nodes normal.  Lab Results  Component Value Date   HGBA1C 6.1 04/05/2017   HGBA1C 6.1 10/14/2016    HGBA1C 6.3 06/10/2016    Lab Results  Component Value Date   CREATININE 0.68 04/05/2017   CREATININE 0.64 10/14/2016   CREATININE 0.56 06/10/2016    Lab Results  Component Value Date   WBC 9.7 10/14/2016   HGB 13.6 10/14/2016   HCT 41.0 10/14/2016   PLT 267.0 10/14/2016   GLUCOSE 92 04/05/2017   CHOL 161 10/14/2016   TRIG 131.0 10/14/2016   HDL 53.70 10/14/2016   LDLDIRECT 119.0 10/11/2014   LDLCALC 81 10/14/2016   ALT 11 04/05/2017   AST 15 04/05/2017   NA 133 (L) 04/05/2017   K 4.8 04/05/2017   CL 98 04/05/2017   CREATININE 0.68 04/05/2017   BUN 14 04/05/2017   CO2 27 04/05/2017   TSH 1.57 12/30/2015   HGBA1C 6.1 04/05/2017    No results found.  Assessment & Plan:   Problem List Items Addressed This Visit    History of alcohol dependence ([place])    She will be celebrating 2 years of sobrety in July.        Alcoholic peripheral neuropathy ([place])    He continues to have activity limiting pain despite maximal dose of gabapentin at  2400 mg daily dose and tramadol tid .[name]  Did not tolerate lyrica due to blurred vision .  Letter of  Support written for work dsiability (20 hours per week limitation)      Pernicious anemia    Patient will resume monthly  B12 injections. Her intrinsic factor antibody test is positive.   Lab Results  Component Value Date   VITAMINB12 250 12/30/2015        Relevant Medications   cyanocobalamin ((VITAMIN B-12)) injection 1,000 mcg (Completed)   Osteoporosis    Petition for prolia has begun  History of fracture during fall       Prediabetes    She has lowered her a1c to 6.1 and kept it low with reduction in dietary sugar.  Lab Results  Component Value Date   HGBA1C 6.1 04/05/2017         Relevant Orders   Hemoglobin A1c (Completed)   Comprehensive metabolic panel (Completed)    Other Visit Diagnoses    B12 deficiency    -  Primary   Relevant Medications   cyanocobalamin ((VITAMIN B-12)) injection 1,000 mcg (Completed)        I am having Merlina K. Zima maintain her cetirizine, aspirin EC, amitriptyline, dicyclomine, cyclobenzaprine, gabapentin, omeprazole, and traMADol. We administered cyanocobalamin.  Meds ordered this encounter  Medications  . traMADol (ULTRAM) 50 MG tablet    Sig: Take 1 tablet (50 mg total) by mouth every 6 (six) hours as needed.    Dispense:  90 tablet    Refill:  5    May refill on or after Apr 22 2017  . cyanocobalamin ((VITAMIN B-12)) injection 1,000 mcg   A total of 25 minutes of face to face time was spent with patient more than half of which was spent in counselling about the above mentioned conditions  and  coordination of care  Medications Discontinued During This Encounter  Medication Reason  . traMADol (ULTRAM) 50 MG tablet Reorder    Follow-up: Return in about 6 months (around 10/03/2017) for CPE.   Crecencio Mc, MD

## 2017-04-05 NOTE — Patient Instructions (Signed)
I will write your letter stating that you are unable to work more than 20 hour per week due to your neuropathy   You will need to continue monthly B12 injections for the rest of your life  And we can teach  You any time

## 2017-04-06 LAB — COMPREHENSIVE METABOLIC PANEL
ALT: 11 U/L (ref 0–35)
AST: 15 U/L (ref 0–37)
Albumin: 4.2 g/dL (ref 3.5–5.2)
Alkaline Phosphatase: 138 U/L — ABNORMAL HIGH (ref 39–117)
BUN: 14 mg/dL (ref 6–23)
CO2: 27 mEq/L (ref 19–32)
Calcium: 9.6 mg/dL (ref 8.4–10.5)
Chloride: 98 mEq/L (ref 96–112)
Creatinine, Ser: 0.68 mg/dL (ref 0.40–1.20)
GFR: 95.38 mL/min (ref >60.00)
Glucose, Bld: 92 mg/dL (ref 70–99)
Potassium: 4.8 mEq/L (ref 3.5–5.1)
Sodium: 133 mEq/L — ABNORMAL LOW (ref 135–145)
Total Bilirubin: 0.4 mg/dL (ref 0.2–1.2)
Total Protein: 7.3 g/dL (ref 6.0–8.3)

## 2017-04-06 LAB — HEMOGLOBIN A1C: Hgb A1c MFr Bld: 6.1 % (ref 4.6–6.5)

## 2017-04-06 NOTE — Assessment & Plan Note (Signed)
She will be celebrating 2 years of sobrety in July.

## 2017-04-06 NOTE — Assessment & Plan Note (Signed)
Patient will resume monthly  B12 injections. Her intrinsic factor antibody test is positive.   Lab Results  Component Value Date   VITAMINB12 250 12/30/2015

## 2017-04-06 NOTE — Assessment & Plan Note (Signed)
She has lowered her a1c to 6.1 and kept it low with reduction in dietary sugar.  Lab Results  Component Value Date   HGBA1C 6.1 04/05/2017

## 2017-04-06 NOTE — Assessment & Plan Note (Addendum)
He continues to have activity limiting pain despite maximal dose of gabapentin at  2400 mg daily dose and tramadol tid .[name]  Did not tolerate lyrica due to blurred vision .  Letter of  Support written for work dsiability (20 hours per week limitation)

## 2017-04-06 NOTE — Assessment & Plan Note (Signed)
Petition for prolia has begun  History of fracture during fall

## 2017-04-07 ENCOUNTER — Telehealth: Payer: Self-pay

## 2017-04-07 NOTE — Telephone Encounter (Signed)
LMTCB in regards to a letter that Dr. Derrel Nip has completed.   Please transfer pt to office.

## 2017-04-12 ENCOUNTER — Telehealth: Payer: Self-pay

## 2017-04-12 NOTE — Telephone Encounter (Signed)
LMTCB. Need to ask pt where she would like for [place] to send this letter that Dr. Derrel Nip typed up for disability or if she needs to pick it up herself. PEC may speak with pt.

## 2017-04-13 ENCOUNTER — Ambulatory Visit: Payer: BLUE CROSS/BLUE SHIELD

## 2017-05-05 NOTE — Telephone Encounter (Signed)
Spoke with pt and she stated that she will be by Tuesday to pick up the letter.

## 2017-05-11 ENCOUNTER — Ambulatory Visit (INDEPENDENT_AMBULATORY_CARE_PROVIDER_SITE_OTHER): Payer: BLUE CROSS/BLUE SHIELD

## 2017-05-11 DIAGNOSIS — E538 Deficiency of other specified B group vitamins: Secondary | ICD-10-CM

## 2017-05-11 MED ORDER — CYANOCOBALAMIN 1000 MCG/ML IJ SOLN
1000.0000 ug | Freq: Once | INTRAMUSCULAR | Status: AC
  Administered 2017-05-11: 1000 ug via INTRAMUSCULAR

## 2017-05-11 NOTE — Progress Notes (Signed)
Patient presented for B 12 injection to left deltoid, patient voiced no concerns nor showed any signs of distress during injection. 

## 2017-05-25 NOTE — Progress Notes (Signed)
Insurance verification for Prolia filed on [employer] Portal. 

## 2017-05-26 ENCOUNTER — Ambulatory Visit: Payer: BLUE CROSS/BLUE SHIELD

## 2017-05-30 NOTE — Progress Notes (Signed)
  I have reviewed the above information and agree with above.   Cam Dauphin, MD 

## 2017-06-18 ENCOUNTER — Telehealth: Payer: Self-pay | Admitting: Internal Medicine

## 2017-06-18 NOTE — Telephone Encounter (Signed)
Insurance verification for Prolia denied patients insurance has canceled I have tried March and April same response insurance canceled.

## 2017-06-29 ENCOUNTER — Ambulatory Visit: Payer: BLUE CROSS/BLUE SHIELD

## 2017-06-29 ENCOUNTER — Telehealth: Payer: Self-pay

## 2017-06-29 ENCOUNTER — Encounter: Payer: Self-pay | Admitting: Internal Medicine

## 2017-06-29 NOTE — Telephone Encounter (Signed)
Copied from [place] [id number]. Topic: General - Other >> Jun 29, 2017  3:34 PM Boyd Kerbs wrote: Reason for CRM: pt. Said that the No Show from 1/28 said she said she canceled the day before on my chart.

## 2017-06-30 NOTE — Telephone Encounter (Signed)
The patient's information has been sent to charge correction to have the no show fee removed. °

## 2017-08-05 ENCOUNTER — Ambulatory Visit (INDEPENDENT_AMBULATORY_CARE_PROVIDER_SITE_OTHER): Payer: BLUE CROSS/BLUE SHIELD

## 2017-08-05 DIAGNOSIS — E538 Deficiency of other specified B group vitamins: Secondary | ICD-10-CM | POA: Diagnosis not present

## 2017-08-05 MED ORDER — CYANOCOBALAMIN 1000 MCG/ML IJ SOLN
1000.0000 ug | Freq: Once | INTRAMUSCULAR | Status: AC
  Administered 2017-08-05: 1000 ug via INTRAMUSCULAR

## 2017-08-05 NOTE — Progress Notes (Addendum)
Patient comes in today for a vitamin B 12 injection. Administered in right deltoid IM. Patient tolerated well.  Reviewed.  Dr Scott 

## 2017-08-19 ENCOUNTER — Other Ambulatory Visit: Payer: Self-pay | Admitting: Internal Medicine

## 2017-09-13 ENCOUNTER — Encounter: Payer: Self-pay | Admitting: Internal Medicine

## 2017-09-13 ENCOUNTER — Ambulatory Visit (INDEPENDENT_AMBULATORY_CARE_PROVIDER_SITE_OTHER): Payer: BLUE CROSS/BLUE SHIELD | Admitting: Internal Medicine

## 2017-09-13 VITALS — BP 116/74 | HR 70 | Temp 98.4°F | Resp 14 | Ht 61.0 in | Wt 106.4 lb

## 2017-09-13 DIAGNOSIS — F329 Major depressive disorder, single episode, unspecified: Secondary | ICD-10-CM | POA: Insufficient documentation

## 2017-09-13 DIAGNOSIS — F1021 Alcohol dependence, in remission: Secondary | ICD-10-CM

## 2017-09-13 DIAGNOSIS — K700 Alcoholic fatty liver: Secondary | ICD-10-CM

## 2017-09-13 DIAGNOSIS — Z1231 Encounter for screening mammogram for malignant neoplasm of breast: Secondary | ICD-10-CM | POA: Diagnosis not present

## 2017-09-13 DIAGNOSIS — G621 Alcoholic polyneuropathy: Secondary | ICD-10-CM

## 2017-09-13 DIAGNOSIS — Z1239 Encounter for other screening for malignant neoplasm of breast: Secondary | ICD-10-CM

## 2017-09-13 DIAGNOSIS — E559 Vitamin D deficiency, unspecified: Secondary | ICD-10-CM

## 2017-09-13 DIAGNOSIS — E538 Deficiency of other specified B group vitamins: Secondary | ICD-10-CM

## 2017-09-13 DIAGNOSIS — F320 Major depressive disorder, single episode, mild: Secondary | ICD-10-CM

## 2017-09-13 MED ORDER — OMEPRAZOLE 40 MG PO CPDR: 40.0000 mg | DELAYED_RELEASE_CAPSULE | Freq: Two times a day (BID) | ORAL | 1 refills | Status: DC

## 2017-09-13 MED ORDER — TRAMADOL HCL 50 MG PO TABS: 50.0000 mg | ORAL_TABLET | Freq: Four times a day (QID) | ORAL | 5 refills | Status: DC | PRN

## 2017-09-13 MED ORDER — CYANOCOBALAMIN 1000 MCG/ML IJ SOLN
1000.0000 ug | Freq: Once | INTRAMUSCULAR | Status: AC
  Administered 2017-09-13: 1000 ug via INTRAMUSCULAR

## 2017-09-13 MED ORDER — GABAPENTIN 400 MG PO CAPS: 800.0000 mg | ORAL_CAPSULE | Freq: Three times a day (TID) | ORAL | 5 refills | Status: DC

## 2017-09-13 MED ORDER — SERTRALINE HCL 50 MG PO TABS: 50.0000 mg | ORAL_TABLET | Freq: Every day | ORAL | 2 refills | Status: DC

## 2017-09-13 NOTE — Patient Instructions (Signed)
    am glad you are willing to start a medication for your mood  We are starting generic Zoloft.  Start with 1/2 tablet daily after a meal,  And increase to a full tablet after 4 or 5 days if yo are not having nausea   We can adjust this dose after 2 weeks  On the full tablet.  Please send me an update through [place].

## 2017-09-13 NOTE — Progress Notes (Signed)
Subjective:  Patient ID: Kathy [name], female    DOB: 11/13/1960  Age: 57 y.o. MRN: [id number]  CC: The primary encounter diagnosis was Breast cancer screening. Diagnoses of Fatty liver, alcoholic, Vitamin D deficiency, B12 deficiency, History of alcohol dependence ([place]), Alcoholic peripheral neuropathy ([place]), and Current mild episode of major depressive disorder without prior episode [facility]) were also pertinent to this visit.  HPI Kathy [name] presents for FOLLOW up on chronic issues including alcoholic polyneuropathy inadequately managed with gabapentin and tramadol.  Gabapentin dose is 800 mg three times daily. (maximal dose)pain is now spreaditn to calves and knees  July 2 celebrated 2 years of sobriety. Survived a beach trip with family  Overcome at times with waves of depression ,  Worried about making ends meet.   Wants to start medicatoin     Aspirin use discontinued,  Because it caused nosebleeds  Needs mammogram   Prediabetes:  Has cut out 3-4 cokes per day    Outpatient Medications Prior to Visit  Medication Sig Dispense Refill  . cyclobenzaprine (FLEXERIL) 10 MG tablet Take 1 tablet (10 mg total) by mouth 3 (three) times daily as needed for muscle spasms. Involving the legs and feet 90 tablet 1  . dicyclomine (BENTYL) 20 MG tablet Take 1 tablet (20 mg total) by mouth every 6 (six) hours as needed for spasms. 120 tablet 5  . gabapentin (NEURONTIN) 400 MG capsule Take 2 capsules (800 mg total) by mouth 3 (three) times daily. 180 capsule 4  . omeprazole (PRILOSEC) 40 MG capsule TAKE 1 CAPSULE BY MOUTH TWICE A DAY 180 capsule 1  . traMADol (ULTRAM) 50 MG tablet Take 1 tablet (50 mg total) by mouth every 6 (six) hours as needed. 90 tablet 5  . amitriptyline (ELAVIL) 25 MG tablet Take 1 tablet (25 mg total) by mouth at bedtime. Increase weekly as needed by 25 mg (Patient not taking: Reported on 09/13/2017) 90 tablet 1  . aspirin EC 81 MG tablet Take 81 mg by mouth daily.     . cetirizine (ZYRTEC) 10 MG tablet Take 10 mg by mouth daily.     No facility-administered medications prior to visit.     Review of Systems;  Patient denies headache, fevers, malaise, unintentional weight loss, skin rash, eye pain, sinus congestion and sinus pain, sore throat, dysphagia,  hemoptysis , cough, dyspnea, wheezing, chest pain, palpitations, orthopnea, edema, abdominal pain, nausea, melena, diarrhea, constipation, flank pain, dysuria, hematuria, urinary  Frequency, nocturia,  seizures,  Focal weakness, Loss of consciousness,  Tremor, insomnia, , and suicidal ideation.      Objective:  BP 116/74 (BP Location: Left Arm, Patient Position: Sitting, Cuff Size: Normal)   Pulse 70   Temp 98.4 F (36.9 C) (Oral)   Resp 14   Ht 5\' 1"  (1.549 m)   Wt 106 lb 6.4 oz (48.3 kg)   SpO2 98%   BMI 20.10 kg/m   BP Readings from Last 3 Encounters:  09/13/17 116/74  04/05/17 118/72  10/14/16 124/68    Wt Readings from Last 3 Encounters:  09/13/17 106 lb 6.4 oz (48.3 kg)  04/05/17 113 lb 6.4 oz (51.4 kg)  10/14/16 115 lb 9.6 oz (52.4 kg)    General appearance: alert, cooperative and appears stated age Ears: normal TM's and external ear canals both ears Throat: lips, mucosa, and tongue normal; teeth and gums normal Neck: no adenopathy, no carotid bruit, supple, symmetrical, trachea midline and thyroid not enlarged, symmetric, no tenderness/mass/nodules Back:  symmetric, no curvature. ROM normal. No CVA tenderness. Lungs: clear to auscultation bilaterally Heart: regular rate and rhythm, S1, S2 normal, no murmur, click, rub or gallop Abdomen: soft, non-tender; bowel sounds normal; no masses,  no organomegaly Pulses: 2+ and symmetric Skin: Skin color, texture, turgor normal. No rashes or lesions Lymph nodes: Cervical, supraclavicular, and axillary nodes normal.  Lab Results  Component Value Date   HGBA1C 6.1 04/05/2017   HGBA1C 6.1 10/14/2016   HGBA1C 6.3 06/10/2016    Lab  Results  Component Value Date   CREATININE 0.68 04/05/2017   CREATININE 0.64 10/14/2016   CREATININE 0.56 06/10/2016    Lab Results  Component Value Date   WBC 9.7 10/14/2016   HGB 13.6 10/14/2016   HCT 41.0 10/14/2016   PLT 267.0 10/14/2016   GLUCOSE 92 04/05/2017   CHOL 161 10/14/2016   TRIG 131.0 10/14/2016   HDL 53.70 10/14/2016   LDLDIRECT 119.0 10/11/2014   LDLCALC 81 10/14/2016   ALT 11 04/05/2017   AST 15 04/05/2017   NA 133 (L) 04/05/2017   K 4.8 04/05/2017   CL 98 04/05/2017   CREATININE 0.68 04/05/2017   BUN 14 04/05/2017   CO2 27 04/05/2017   TSH 1.57 12/30/2015   HGBA1C 6.1 04/05/2017    No results found.  Assessment & Plan:   Problem List Items Addressed This Visit    Major depressive disorder with single episode    Her depression is mixed with anxiety about her future and financial wellbeing. Trial of zoloft starting at 25 mg daily.        Relevant Medications   sertraline (ZOLOFT) 50 MG tablet   History of alcohol dependence ([place])    Celebrated 2 years of sobriety July 2 .  Encouraged to continue attending AA and to respect the disease       Fatty liver, alcoholic   Relevant Orders   Comprehensive metabolic panel   Alcoholic peripheral neuropathy ([place])    sHe continues to have activity limiting pain despite maximal dose of gabapentin at  2400 mg daily dose and tramadol tid .[name]  Did not tolerate lyrica due to blurred vision .  She has had her work hours adjusted by her employer to allow her to work 22 hours per week.       Relevant Medications   sertraline (ZOLOFT) 50 MG tablet   gabapentin (NEURONTIN) 400 MG capsule    Other Visit Diagnoses    Breast cancer screening    -  Primary   Relevant Orders   MM DIGITAL SCREENING BILATERAL   Vitamin D deficiency       Relevant Orders   VITAMIN D 25 Hydroxy (Vit-D Deficiency, Fractures)   B12 deficiency       Relevant Medications   cyanocobalamin ((VITAMIN B-12)) injection 1,000 mcg (Completed)        I have discontinued Aaryana K. Rease's cetirizine, aspirin EC, and amitriptyline. I have also changed her omeprazole. Additionally, I am having her start on sertraline. Lastly, I am having her maintain her dicyclomine, cyclobenzaprine, gabapentin, traMADol, and traMADol. We administered cyanocobalamin.  Meds ordered this encounter  Medications  . omeprazole (PRILOSEC) 40 MG capsule    Sig: Take 1 capsule (40 mg total) by mouth 2 (two) times daily.    Dispense:  180 capsule    Refill:  1  . sertraline (ZOLOFT) 50 MG tablet    Sig: Take 1 tablet (50 mg total) by mouth daily.    Dispense:  30 tablet    Refill:  2  . gabapentin (NEURONTIN) 400 MG capsule    Sig: Take 2 capsules (800 mg total) by mouth 3 (three) times daily.    Dispense:  180 capsule    Refill:  5  . traMADol (ULTRAM) 50 MG tablet    Sig: Take 1 tablet (50 mg total) by mouth every 6 (six) hours as needed.    Dispense:  90 tablet    Refill:  5    May refill on or after Apr 22 2017  . traMADol (ULTRAM) 50 MG tablet    Sig: Take 1 tablet (50 mg total) by mouth every 6 (six) hours as needed.    Dispense:  90 tablet    Refill:  5    May refill on or after September 19 2017  . cyanocobalamin ((VITAMIN B-12)) injection 1,000 mcg    Medications Discontinued During This Encounter  Medication Reason  . aspirin EC 81 MG tablet Patient Preference  . cetirizine (ZYRTEC) 10 MG tablet Patient Preference  . omeprazole (PRILOSEC) 40 MG capsule Reorder  . gabapentin (NEURONTIN) 400 MG capsule Reorder  . amitriptyline (ELAVIL) 25 MG tablet   . traMADol (ULTRAM) 50 MG tablet Reorder    Follow-up: Return in about 6 months (around 03/16/2018).   Crecencio Mc, MD

## 2017-09-13 NOTE — Assessment & Plan Note (Signed)
Her depression is mixed with anxiety about her future and financial wellbeing. Trial of zoloft starting at 25 mg daily.

## 2017-09-13 NOTE — Assessment & Plan Note (Addendum)
Celebrated 2 years of sobriety July 2 .  Encouraged to continue attending AA and to respect the disease

## 2017-09-13 NOTE — Assessment & Plan Note (Signed)
sHe continues to have activity limiting pain despite maximal dose of gabapentin at  2400 mg daily dose and tramadol tid .[name]  Did not tolerate lyrica due to blurred vision .  She has had her work hours adjusted by her employer to allow her to work 22 hours per week.

## 2017-09-14 LAB — COMPREHENSIVE METABOLIC PANEL
ALT: 12 U/L (ref 0–35)
AST: 17 U/L (ref 0–37)
Albumin: 4.1 g/dL (ref 3.5–5.2)
Alkaline Phosphatase: 108 U/L (ref 39–117)
BUN: 11 mg/dL (ref 6–23)
CO2: 28 mEq/L (ref 19–32)
Calcium: 9.2 mg/dL (ref 8.4–10.5)
Chloride: 99 mEq/L (ref 96–112)
Creatinine, Ser: 0.68 mg/dL (ref 0.40–1.20)
GFR: 95.23 mL/min (ref >60.00)
Glucose, Bld: 85 mg/dL (ref 70–99)
Potassium: 4.6 mEq/L (ref 3.5–5.1)
Sodium: 134 mEq/L — ABNORMAL LOW (ref 135–145)
Total Bilirubin: 0.3 mg/dL (ref 0.2–1.2)
Total Protein: 7.2 g/dL (ref 6.0–8.3)

## 2017-09-14 LAB — VITAMIN D 25 HYDROXY (VIT D DEFICIENCY, FRACTURES): VITD: 43.59 ng/mL (ref 30.00–100.00)

## 2017-11-12 ENCOUNTER — Ambulatory Visit
Admission: RE | Admit: 2017-11-12 | Discharge: 2017-11-12 | Disposition: A | Discharge status: Home / Self Care | Payer: BLUE CROSS/BLUE SHIELD | Source: Ambulatory Visit | Attending: Internal Medicine | Admitting: Internal Medicine

## 2017-11-12 DIAGNOSIS — Z1231 Encounter for screening mammogram for malignant neoplasm of breast: Secondary | ICD-10-CM | POA: Insufficient documentation

## 2017-11-12 DIAGNOSIS — Z1239 Encounter for other screening for malignant neoplasm of breast: Secondary | ICD-10-CM

## 2017-11-23 ENCOUNTER — Ambulatory Visit: Payer: BLUE CROSS/BLUE SHIELD | Admitting: Internal Medicine

## 2017-11-23 ENCOUNTER — Encounter: Payer: Self-pay | Admitting: Internal Medicine

## 2017-11-23 VITALS — BP 120/66 | HR 70 | Temp 98.5°F | Resp 15 | Ht 61.0 in | Wt 106.0 lb

## 2017-11-23 DIAGNOSIS — G621 Alcoholic polyneuropathy: Secondary | ICD-10-CM

## 2017-11-23 DIAGNOSIS — E538 Deficiency of other specified B group vitamins: Secondary | ICD-10-CM | POA: Diagnosis not present

## 2017-11-23 DIAGNOSIS — F320 Major depressive disorder, single episode, mild: Secondary | ICD-10-CM | POA: Diagnosis not present

## 2017-11-23 DIAGNOSIS — D510 Vitamin B12 deficiency anemia due to intrinsic factor deficiency: Secondary | ICD-10-CM | POA: Diagnosis not present

## 2017-11-23 MED ORDER — CYANOCOBALAMIN 1000 MCG/ML IJ SOLN
1000.0000 ug | Freq: Once | INTRAMUSCULAR | Status: AC
  Administered 2017-11-23: 1000 ug via INTRAMUSCULAR

## 2017-11-23 MED ORDER — DULOXETINE HCL 20 MG PO CPEP: 20.0000 mg | ORAL_CAPSULE | Freq: Every day | ORAL | 3 refills | Status: DC

## 2017-11-23 NOTE — Progress Notes (Signed)
Subjective:  Patient ID: Kathy [name], female    DOB: 03-Jul-1960  Age: 57 y.o. MRN: [id number]  CC: The primary encounter diagnosis was B12 deficiency. Diagnoses of Alcoholic peripheral neuropathy ([place]), Current mild episode of major depressive disorder without prior episode ([place]), and Pernicious anemia were also pertinent to this visit.  HPI Kathy [name] presents for follow up on polyneuropathy  secondary to b12 deficiency and alcohol abuse.  Her pain is currently suboptimally managed with tramadol  And gabapentin. . She has been having an increasingly difficult time making ends meet on her income which has been reduced because she can only work part time . She cannot walk distances greater than 300 feet , and cannot stand for more than 15 to 20 minutes before her pain becomes signigicant.       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 cyclobenzaprine (FLEXERIL) 10 MG tablet Take 1 tablet (10 mg total) by mouth 3 (three) times daily as needed for muscle spasms. Involving the legs and feet 90 tablet 1  . dicyclomine (BENTYL) 20 MG tablet Take 1 tablet (20 mg total) by mouth every 6 (six) hours as needed for spasms. 120 tablet 5  . gabapentin (NEURONTIN) 400 MG capsule Take 2 capsules (800 mg total) by mouth 3 (three) times daily. 180 capsule 5  . omeprazole (PRILOSEC) 40 MG capsule Take 1 capsule (40 mg total) by mouth 2 (two) times daily. 180 capsule 1  . traMADol (ULTRAM) 50 MG tablet Take 1 tablet (50 mg total) by mouth every 6 (six) hours as needed. 90 tablet 5  . traMADol (ULTRAM) 50 MG tablet Take 1 tablet (50 mg total) by mouth every 6 (six) hours as needed. 90 tablet 5  . sertraline (ZOLOFT) 50 MG tablet Take 1 tablet (50 mg total) by mouth daily. (Patient not taking: Reported on 11/23/2017) 30 tablet 2   No facility-administered medications prior to  visit.     Review of Systems;  Patient denies headache, fevers, malaise, unintentional weight loss, skin rash, eye pain, sinus congestion and sinus pain, sore throat, dysphagia,  hemoptysis , cough, dyspnea, wheezing, chest pain, palpitations, orthopnea, edema, abdominal pain, nausea, melena, diarrhea, constipation, flank pain, dysuria, hematuria, urinary  Frequency, nocturia, numbness, tingling, seizures,  Focal weakness, Loss of consciousness,  Tremor, insomnia, depression, anxiety, and suicidal ideation.      Objective:  BP 120/66 (BP Location: Left Arm, Patient Position: Sitting, Cuff Size: Normal)   Pulse 70   Temp 98.5 F (36.9 C) (Oral)   Resp 15   Ht 5\' 1"  (1.549 m)   Wt 106 lb (48.1 kg)   SpO2 98%   BMI 20.03 kg/m   BP Readings from Last 3 Encounters:  11/23/17 120/66  09/13/17 116/74  04/05/17 118/72    Wt Readings from Last 3 Encounters:  11/23/17 106 lb (48.1 kg)  09/13/17 106 lb 6.4 oz (48.3 kg)  04/05/17 113 lb 6.4 oz (51.4 kg)    General appearance: alert, cooperative and appears stated age Ears: normal TM's and external ear canals both ears Throat: lips, mucosa, and tongue normal; teeth and gums normal Neck: no adenopathy, no carotid bruit, supple, symmetrical, trachea midline and thyroid not enlarged, symmetric, no tenderness/mass/nodules Back: symmetric, no curvature. ROM normal. No CVA tenderness. Lungs: clear to auscultation bilaterally Heart: regular rate and rhythm, S1, S2 normal,  no murmur, click, rub or gallop Abdomen: soft, non-tender; bowel sounds normal; no masses,  no organomegaly Pulses: 2+ and symmetric Skin: Skin color, texture, turgor normal. No rashes or lesions Lymph nodes: Cervical, supraclavicular, and axillary nodes normal.  Lab Results  Component Value Date   HGBA1C 6.1 04/05/2017   HGBA1C 6.1 10/14/2016   HGBA1C 6.3 06/10/2016    Lab Results  Component Value Date   CREATININE 0.68 09/13/2017   CREATININE 0.68 04/05/2017    CREATININE 0.64 10/14/2016    Lab Results  Component Value Date   WBC 9.7 10/14/2016   HGB 13.6 10/14/2016   HCT 41.0 10/14/2016   PLT 267.0 10/14/2016   GLUCOSE 85 09/13/2017   CHOL 161 10/14/2016   TRIG 131.0 10/14/2016   HDL 53.70 10/14/2016   LDLDIRECT 119.0 10/11/2014   LDLCALC 81 10/14/2016   ALT 12 09/13/2017   AST 17 09/13/2017   NA 134 (L) 09/13/2017   K 4.6 09/13/2017   CL 99 09/13/2017   CREATININE 0.68 09/13/2017   BUN 11 09/13/2017   CO2 28 09/13/2017   TSH 1.57 12/30/2015   HGBA1C 6.1 04/05/2017    Mm 3d Screen Breast Bilateral  Result Date: 11/15/2017 CLINICAL DATA:  Screening. EXAM: DIGITAL SCREENING BILATERAL MAMMOGRAM WITH TOMO AND CAD COMPARISON:  Previous exam(s). ACR Breast Density Category b: There are scattered areas of fibroglandular density. FINDINGS: There are no findings suspicious for malignancy. Images were processed with CAD. IMPRESSION: No mammographic evidence of malignancy. A result letter of this screening mammogram will be mailed directly to the patient. RECOMMENDATION: Screening mammogram in one year. (Code:SM-B-01Y) BI-RADS CATEGORY  1: Negative. Electronically Signed   By: Abelardo Diesel M.D.   On: 11/15/2017 09:29    Assessment & Plan:   Problem List Items Addressed This Visit    Alcoholic peripheral neuropathy ([place])    Pain is suboptimally controlled on tramadol and gabapentin.  Trial of cymbalta to be added for pain management.  Letter of support for disability written       Relevant Medications   DULoxetine (CYMBALTA) 20 MG capsule   Major depressive disorder with single episode    She has been more depressed due to the financial consequences of her alcoholism. Because it is also impacting her parents .  Adding cymbalta.       Relevant Medications   DULoxetine (CYMBALTA) 20 MG capsule   Pernicious anemia    Patient has resumed monthly  B12 injections. Her intrinsic factor antibody test is positive.    Lab Results  Component  Value Date   VITAMINB12 250 12/30/2015        Relevant Medications   cyanocobalamin ((VITAMIN B-12)) injection 1,000 mcg (Completed)    Other Visit Diagnoses    B12 deficiency    -  Primary   Relevant Medications   cyanocobalamin ((VITAMIN B-12)) injection 1,000 mcg (Completed)    A total of 40 minutes was spent with patient more than half of which was spent in counseling patient on the above mentioned issues  and coordination of care.  I have discontinued Lessly K. Mira's sertraline. I am also having her start on DULoxetine. Additionally, I am having her maintain her dicyclomine, cyclobenzaprine, omeprazole, gabapentin, traMADol, and traMADol. We administered cyanocobalamin.  Meds ordered this encounter  Medications  . DULoxetine (CYMBALTA) 20 MG capsule    Sig: Take 1 capsule (20 mg total) by mouth daily.    Dispense:  90 capsule    Refill:  3  .  cyanocobalamin ((VITAMIN B-12)) injection 1,000 mcg    Medications Discontinued During This Encounter  Medication Reason  . sertraline (ZOLOFT) 50 MG tablet     Follow-up: Return in about 3 months (around 02/23/2018).   Crecencio Mc, MD

## 2017-11-23 NOTE — Patient Instructions (Addendum)
Continue the gabapentin at your current dose  I am prescribing  generic cymbalta  starting at 20 mg daily  Dose.  Take it in the evening after dinner.  suspend scheduled tramadol but ok to use  It as needed   If you develop agitation or involuntary jerking or arms or legs,  Stop the cymbalta   If you tolerate the medication ,  The dose can be increased to 40 mg after 2 weeks as long as you don't develop the above symptoms     Trigger Finger Trigger finger (stenosing tenosynovitis) is a condition that causes a finger to get stuck in a bent position. Each finger has a tough, cord-like tissue that connects muscle to bone (tendon), and each tendon is surrounded by a tunnel of tissue (tendon sheath). To move your finger, your tendon needs to slide freely through the sheath. Trigger finger happens when the tendon or the sheath thickens, making it difficult to move your finger. Trigger finger can affect any finger or a thumb. It may affect more than one finger. Mild cases may clear up with rest and medicine. Severe cases require more treatment. What are the causes? Trigger finger is caused by a thickened finger tendon or tendon sheath. The cause of this thickening is not known. What increases the risk? The following factors may make you more likely to develop this condition:  Doing activities that require a strong grip.  Having rheumatoid arthritis, gout, or diabetes.  Being 62-71 years old.  Being a woman.  What are the signs or symptoms? Symptoms of this condition include:  Pain when bending or straightening your finger.  Tenderness or swelling where your finger attaches to the palm of your hand.  A lump in the palm of your hand or on the inside of your finger.  Hearing a popping sound when you try to straighten your finger.  Feeling a popping, catching, or locking sensation when you try to straighten your finger.  Being unable to straighten your finger.  How is this  diagnosed? This condition is diagnosed based on your symptoms and a physical exam. How is this treated? This condition may be treated by:  Resting your finger and avoiding activities that make symptoms worse.  Wearing a finger splint to keep your finger in a slightly bent position.  Taking NSAIDs to relieve pain and swelling.  Injecting medicine (steroids) into the tendon sheath to reduce swelling and irritation. Injections may need to be repeated.  Having surgery to open the tendon sheath. This may be done if other treatments do not work and you cannot straighten your finger. You may need physical therapy after surgery.  Follow these instructions at home:  Use moist heat to help reduce pain and swelling as told by your health care provider.  Rest your finger and avoid activities that make pain worse. Return to normal activities as told by your health care provider.  If you have a splint, wear it as told by your health care provider.  Take over-the-counter and prescription medicines only as told by your health care provider.  Keep all follow-up visits as told by your health care provider. This is important. Contact a health care provider if:  Your symptoms are not improving with home care. Summary  Trigger finger (stenosing tenosynovitis) causes your finger to get stuck in a bent position, and it can make it difficult and painful to straighten your finger.  This condition develops when a finger tendon or tendon sheath thickens.  Treatment starts with resting, wearing a splint, and taking NSAIDs.  In severe cases, surgery to open the tendon sheath may be needed. This information is not intended to replace advice given to you by your health care provider. Make sure you discuss any questions you have with your health care provider. Document Released: 11/30/2003 Document Revised: 01/21/2016 Document Reviewed: 01/21/2016 Elsevier Interactive Patient Education  2017 [employer].

## 2017-11-24 NOTE — Assessment & Plan Note (Signed)
Patient has resumed monthly  B12 injections. Her intrinsic factor antibody test is positive.    Lab Results  Component Value Date   VITAMINB12 250 12/30/2015

## 2017-11-24 NOTE — Assessment & Plan Note (Signed)
Pain is suboptimally controlled on tramadol and gabapentin.  Trial of cymbalta to be added for pain management.  Letter of support for disability written

## 2017-11-24 NOTE — Assessment & Plan Note (Signed)
She has been more depressed due to the financial consequences of her alcoholism. Because it is also impacting her parents .  Adding cymbalta.

## 2017-11-25 NOTE — Progress Notes (Signed)
WE tried under [place] care and received letter insurance was terminated. It looks like she has [employer] now so I will try under that insurance.

## 2017-11-25 NOTE — Progress Notes (Signed)
Insurance verification for Prolia filed on [employer] Portal. 

## 2017-12-07 ENCOUNTER — Encounter: Payer: Self-pay | Admitting: Internal Medicine

## 2017-12-07 NOTE — Progress Notes (Signed)
Insurance verification for Prolia filed on [employer] Portal. 

## 2018-01-05 ENCOUNTER — Telehealth: Payer: Self-pay | Admitting: Internal Medicine

## 2018-01-05 NOTE — Telephone Encounter (Signed)
Patient insurance denied appeal for Prolia due to patient has not tried alternative therapies.

## 2018-01-05 NOTE — Telephone Encounter (Signed)
She has a history of alcohol induced gastritis , now  sober,  And continued tobacco abuse, making bisphosphoates and  and raloxifene contraindicated. If that information has been tried, and they still refuse,  Let me know.  I will have to refer her to Dr Loanne Drilling for infusion therapy

## 2018-01-10 ENCOUNTER — Telehealth: Payer: Self-pay

## 2018-01-10 NOTE — Telephone Encounter (Signed)
Yes you can [place] 11:30 or 4:30

## 2018-01-10 NOTE — Telephone Encounter (Signed)
Is it okay to schedule her in an available 11:30 slot?

## 2018-01-10 NOTE — Telephone Encounter (Signed)
Spoke with pt and she has been scheduled for 02/18/2018 @ 11:30am. Pt is aware of appt date and time.

## 2018-01-10 NOTE — Telephone Encounter (Signed)
Copied from [place] [id number]. Topic: Appointment Scheduling - Scheduling Inquiry for Clinic >> Jan 10, 2018  2:46 PM Conception Chancy, NT wrote: Reason for CRM: patient is calling and wanting to schedule a follow up appointment with Dr. Derrel Nip before the end of the year due to possible insurance change. Dr. Derrel Nip first available is 02/28/18. Please advise.

## 2018-01-18 ENCOUNTER — Ambulatory Visit (INDEPENDENT_AMBULATORY_CARE_PROVIDER_SITE_OTHER): Payer: BLUE CROSS/BLUE SHIELD

## 2018-01-18 DIAGNOSIS — E538 Deficiency of other specified B group vitamins: Secondary | ICD-10-CM

## 2018-01-18 MED ORDER — CYANOCOBALAMIN 1000 MCG/ML IJ SOLN
1000.0000 ug | Freq: Once | INTRAMUSCULAR | Status: AC
  Administered 2018-01-18: 1000 ug via INTRAMUSCULAR

## 2018-01-18 NOTE — Progress Notes (Signed)
Patient comes in for B 12 injection.  Injected left deltoid.  Patient tolerated injection well.  

## 2018-01-19 ENCOUNTER — Ambulatory Visit: Payer: BLUE CROSS/BLUE SHIELD

## 2018-01-19 NOTE — Telephone Encounter (Signed)
Pharmacist, community for [facility] on [place] the 3 rd verification using "She has a history of alcohol induced gastritis , now sober, And continued tobacco abuse, making bisphosphoates and and raloxifene contraindicated"

## 2018-01-23 NOTE — Progress Notes (Signed)
  I have reviewed the above information and agree with above.   Teresa Tullo, MD 

## 2018-02-04 ENCOUNTER — Telehealth: Payer: Self-pay | Admitting: Internal Medicine

## 2018-02-04 NOTE — Telephone Encounter (Signed)
Filed Prior Authorization with [employer] of [place] for Prolia patient has up coming appointment.

## 2018-02-18 ENCOUNTER — Ambulatory Visit (INDEPENDENT_AMBULATORY_CARE_PROVIDER_SITE_OTHER): Payer: BLUE CROSS/BLUE SHIELD | Admitting: *Deleted

## 2018-02-18 ENCOUNTER — Ambulatory Visit: Payer: BLUE CROSS/BLUE SHIELD | Admitting: Internal Medicine

## 2018-02-18 DIAGNOSIS — M810 Age-related osteoporosis without current pathological fracture: Secondary | ICD-10-CM

## 2018-02-18 DIAGNOSIS — Z0289 Encounter for other administrative examinations: Secondary | ICD-10-CM

## 2018-02-18 DIAGNOSIS — E538 Deficiency of other specified B group vitamins: Secondary | ICD-10-CM | POA: Diagnosis not present

## 2018-02-18 MED ORDER — DENOSUMAB 60 MG/ML ~~LOC~~ SOSY
60.0000 mg | PREFILLED_SYRINGE | Freq: Once | SUBCUTANEOUS | Status: AC
  Administered 2018-02-18: 60 mg via SUBCUTANEOUS

## 2018-02-18 MED ORDER — CYANOCOBALAMIN 1000 MCG/ML IJ SOLN
1000.0000 ug | Freq: Once | INTRAMUSCULAR | Status: AC
  Administered 2018-02-18: 1000 ug via INTRAMUSCULAR

## 2018-02-18 NOTE — Progress Notes (Signed)
Patient presented for B 12 injection to left deltoid, patient voiced no concerns nor showed any signs of distress during injection    Patient presented for Prolia injection to Right arm [place], patient voiced no concerns or complaints during or after injection.

## 2018-02-21 ENCOUNTER — Encounter: Payer: Self-pay | Admitting: Internal Medicine

## 2018-02-21 ENCOUNTER — Ambulatory Visit (INDEPENDENT_AMBULATORY_CARE_PROVIDER_SITE_OTHER): Payer: BLUE CROSS/BLUE SHIELD | Admitting: Internal Medicine

## 2018-02-21 VITALS — BP 92/68 | HR 70 | Temp 98.1°F | Resp 14 | Ht 61.0 in | Wt 101.8 lb

## 2018-02-21 DIAGNOSIS — Z0000 Encounter for general adult medical examination without abnormal findings: Secondary | ICD-10-CM

## 2018-02-21 DIAGNOSIS — M810 Age-related osteoporosis without current pathological fracture: Secondary | ICD-10-CM

## 2018-02-21 DIAGNOSIS — K2100 Gastro-esophageal reflux disease with esophagitis, without bleeding: Secondary | ICD-10-CM

## 2018-02-21 DIAGNOSIS — Z8781 Personal history of (healed) traumatic fracture: Secondary | ICD-10-CM | POA: Diagnosis not present

## 2018-02-21 DIAGNOSIS — N261 Atrophy of kidney (terminal): Secondary | ICD-10-CM

## 2018-02-21 DIAGNOSIS — K700 Alcoholic fatty liver: Secondary | ICD-10-CM

## 2018-02-21 DIAGNOSIS — K5730 Diverticulosis of large intestine without perforation or abscess without bleeding: Secondary | ICD-10-CM | POA: Diagnosis not present

## 2018-02-21 DIAGNOSIS — R7303 Prediabetes: Secondary | ICD-10-CM

## 2018-02-21 DIAGNOSIS — G621 Alcoholic polyneuropathy: Secondary | ICD-10-CM

## 2018-02-21 DIAGNOSIS — F320 Major depressive disorder, single episode, mild: Secondary | ICD-10-CM

## 2018-02-21 DIAGNOSIS — E559 Vitamin D deficiency, unspecified: Secondary | ICD-10-CM

## 2018-02-21 DIAGNOSIS — E538 Deficiency of other specified B group vitamins: Secondary | ICD-10-CM | POA: Diagnosis not present

## 2018-02-21 DIAGNOSIS — D510 Vitamin B12 deficiency anemia due to intrinsic factor deficiency: Secondary | ICD-10-CM

## 2018-02-21 LAB — COMPREHENSIVE METABOLIC PANEL
ALT: 11 U/L (ref 0–35)
AST: 15 U/L (ref 0–37)
Albumin: 4.3 g/dL (ref 3.5–5.2)
Alkaline Phosphatase: 96 U/L (ref 39–117)
BUN: 12 mg/dL (ref 6–23)
CO2: 25 mEq/L (ref 19–32)
Calcium: 9.3 mg/dL (ref 8.4–10.5)
Chloride: 102 mEq/L (ref 96–112)
Creatinine, Ser: 0.64 mg/dL (ref 0.40–1.20)
GFR: 101.97 mL/min (ref >60.00)
Glucose, Bld: 83 mg/dL (ref 70–99)
Potassium: 4.8 mEq/L (ref 3.5–5.1)
Sodium: 134 mEq/L — ABNORMAL LOW (ref 135–145)
Total Bilirubin: 0.4 mg/dL (ref 0.2–1.2)
Total Protein: 7.1 g/dL (ref 6.0–8.3)

## 2018-02-21 LAB — HEMOGLOBIN A1C: Hgb A1c MFr Bld: 5.9 % (ref 4.6–6.5)

## 2018-02-21 LAB — VITAMIN D 25 HYDROXY (VIT D DEFICIENCY, FRACTURES): VITD: 56.09 ng/mL (ref 30.00–100.00)

## 2018-02-21 MED ORDER — CYANOCOBALAMIN 1000 MCG/ML IJ SOLN: 1000.0000 ug | Freq: Once | INTRAMUSCULAR | 0 refills | Status: AC

## 2018-02-21 MED ORDER — DULOXETINE HCL 40 MG PO CPEP: 40.0000 mg | ORAL_CAPSULE | Freq: Every day | ORAL | 1 refills | Status: DC

## 2018-02-21 MED ORDER — "SYRINGE 25G X 1"" 3 ML MISC": 0 refills | Status: DC

## 2018-02-21 NOTE — Patient Instructions (Signed)
Your Prolia injection may not be covered if you get it done here in 6 months  I recommend establishing care with a COVERED provider well in advance of your next injection   b12 liquid and syringes for use with B12 injections  Sent to total care  cymbalta dose increased  To 40 mg daily

## 2018-02-21 NOTE — Progress Notes (Signed)
Patient ID: Kathy [name], female    DOB: 1960/10/20  Age: 57 y.o. MRN: [id number]  The patient is here for annual  Preventive  examination and management of other chronic and acute problems.  Mammogram normal Sept 2019 PAP normal 2018 Colonoscopy 2014    The risk factors are reflected in the social history.  The roster of all physicians providing medical care to patient - is listed in the Snapshot section of the chart.  Activities of daily living:  The patient is 100% independent in all ADLs: dressing, toileting, feeding as well as independent mobility  Home safety : The patient has smoke detectors in the home. They wear seatbelts.  There are no firearms at home. There is no violence in the home.   There is no risks for hepatitis, STDs or HIV. There is no   history of blood transfusion. They have no travel history to infectious disease endemic areas of the world.  The patient has seen their dentist in the last six month. They have seen their eye doctor in the last year.  They have a history of excessive sun exposure. Discussed the need for sun protection: hats, long sleeves and use of sunscreen if there is significant sun exposure.   Diet: the importance of a healthy diet is discussed. They do have a healthy diet.  The benefits of regular aerobic exercise were discussed. She does not walk due to neuropathic pain    Depression screen: there are no signs or vegative symptoms of untreated depression- irritability, change in appetite, anhedonia, sadness/tearfullness.   The following portions of the patient's history were reviewed and updated as appropriate: allergies, current medications, past family history, past medical history,  past surgical history, past social history  and problem list.  Visual acuity was not assessed per patient preference since she has regular follow up with her ophthalmologist. Hearing and body mass index were assessed and reviewed.   During the course of  the visit the patient was educated and counseled about appropriate screening and preventive services including : fall prevention , diabetes screening, nutrition counseling, colorectal cancer screening, and recommended immunizations.    CC: The primary encounter diagnosis was Encounter for preventive health examination. Diagnoses of Vitamin D insufficiency, Prediabetes, B12 deficiency, History of fracture of femur, Alcoholic peripheral neuropathy ([place]), Current mild episode of major depressive disorder without prior episode ([place]), Pernicious anemia, Osteoporosis without current pathological fracture, unspecified osteoporosis type, Fatty liver, alcoholic, Gastroesophageal reflux disease with esophagitis, Atrophy of left kidney, and Diverticulosis of colon without diverticulitis were also pertinent to this visit. follow up on depression complicated by chronic pain secondary to osteoporosis with prior fractures, and peripheral neuropathy secondary to alcohol abuse and  b12 deficiency secondary to pernicious anemia.    She has been receiving B12 injections  Intermittently.  She feels generally well today,  But continues to have chronic lower extremity pain aggravated by prolonged standing or walking managed with scheduled doses of tramadol and requiring foot rest.  She feels that the cymbalta has helped improve her mood and tolerate her pain .  She remains alcohol abstinent.   Sh has a very supportive employer, who has valued her years of service and is making concessions for her including assisting her financially in addressing her dental issues.  follow up on depression complicated by chronic pain secondary to osteoporosis with prior fractures, and peripheral neuropathy secondary to alcohol abuse and  b12 deficiency secondary to pernicious anemia.    She has been  receiving B12 injections  Intermittently.  She feels generally well today,  But continues to have chronic lower extremity pain aggravated by prolonged  standing or walking managed with scheduled doses of tramadol and requiring foot rest.  She feels that the cymbalta has helped improve her mood and tolerate her pain .  She remains alcohol abstinent.   Sh has a very supportive employer, who has valued her years of service and is making concessions for her including assisting her financially in addressing her dental issues.     History Kathy [name] has a past medical history of Alcohol abuse, Chicken pox, Colon polyps, Depression, Hernia of abdominal cavity, Hip fracture (HCC) (right), and Neuropathy.   She has no past surgical history on file.   Her family history includes Cancer (age of onset: 76) in her father; Hypertension in her mother.She reports that she has been smoking cigarettes. She has a 30.00 pack-year smoking history. She has never used smokeless tobacco. She reports that she does not drink alcohol or use drugs.  Outpatient Medications Prior to Visit  Medication Sig Dispense Refill  . cyclobenzaprine (FLEXERIL) 10 MG tablet Take 1 tablet (10 mg total) by mouth 3 (three) times daily as needed for muscle spasms. Involving the legs and feet 90 tablet 1  . dicyclomine (BENTYL) 20 MG tablet Take 1 tablet (20 mg total) by mouth every 6 (six) hours as needed for spasms. 120 tablet 5  . gabapentin (NEURONTIN) 400 MG capsule Take 2 capsules (800 mg total) by mouth 3 (three) times daily. 180 capsule 5  . omeprazole (PRILOSEC) 40 MG capsule Take 1 capsule (40 mg total) by mouth 2 (two) times daily. 180 capsule 1  . traMADol (ULTRAM) 50 MG tablet Take 1 tablet (50 mg total) by mouth every 6 (six) hours as needed. 90 tablet 5  . DULoxetine (CYMBALTA) 20 MG capsule Take 1 capsule (20 mg total) by mouth daily. 90 capsule 3  . traMADol (ULTRAM) 50 MG tablet Take 1 tablet (50 mg total) by mouth every 6 (six) hours as needed. (Patient not taking: Reported on 02/21/2018) 90 tablet 5   No facility-administered medications prior to visit.     Review of  Systems   Patient denies headache, fevers, malaise, unintentional weight loss, skin rash, eye pain, sinus congestion and sinus pain, sore throat, dysphagia,  hemoptysis , cough, dyspnea, wheezing, chest pain, palpitations, orthopnea, edema, abdominal pain, nausea, melena, diarrhea, constipation, flank pain, dysuria, hematuria, urinary  Frequency, nocturia, numbness, tingling, seizures,  Focal weakness, Loss of consciousness,  Tremor, insomnia, depression, anxiety, and suicidal ideation.      Objective:  BP 92/68 (BP Location: Left Arm, Patient Position: Sitting, Cuff Size: Normal)   Pulse 70   Temp 98.1 F (36.7 C) (Oral)   Resp 14   Ht 5\' 1"  (1.549 m)   Wt 101 lb 12.8 oz (46.2 kg)   SpO2 98%   BMI 19.23 kg/m   Physical Exam   General appearance: alert, cooperative and appears stated age Ears: normal TM's and external ear canals both ears Throat: lips, mucosa, and tongue normal; teeth and gums normal Neck: no adenopathy, no carotid bruit, supple, symmetrical, trachea midline and thyroid not enlarged, symmetric, no tenderness/mass/nodules Back: symmetric, no curvature. ROM normal. No CVA tenderness. Lungs: clear to auscultation bilaterally Heart: regular rate and rhythm, S1, S2 normal, no murmur, click, rub or gallop Abdomen: soft, non-tender; bowel sounds normal; no masses,  no organomegaly Pulses: 2+ and symmetric Skin: extensive sun damage to  skin , , turgor normal. No rashes or lesions Lymph nodes: Cervical, supraclavicular, and axillary nodes normal. Psych: affect normal, makes good eye contact. No fidgeting,  Smiles easily.  Denies suicidal thoughts      Assessment & Plan:   Problem List Items Addressed This Visit    Alcoholic peripheral neuropathy ([place])    Pain was suboptimally controlled on tramadol and gabapentin.  Trial of cymbalta has helped  for pain management bur she remains unable to sit or stand for more than 15 minutes without having intolerable pain that  requries change in position and rest. .  Letter of support for disability written.       Relevant Medications   DULoxetine 40 MG CPEP   Atrophy of left kidney    By CT scan ordered by Bronson Ing in 2016 . Workup unclear      Diverticulosis of colon without diverticulitis   Encounter for preventive health examination - Primary    age appropriate education and counseling updated, referrals for preventative services and immunizations addressed, dietary and smoking counseling addressed, most recent labs reviewed.  I have personally reviewed and have noted:  1) the patient's medical and social history 2) The pt's use of alcohol, tobacco, and illicit drugs 3) The patient's current medications and supplements 4) Functional ability including ADL's, fall risk, home safety risk, hearing and visual impairment 5) Diet and physical activities 6) Evidence for depression or mood disorder 7) The patient's height, weight, and BMI have been recorded in the chart  I have made referrals, and provided counseling and education based on review of the above      Fatty liver, alcoholic    By ultrasound.  LFTs are normal and she has Has been alcohol abstinent for over two years .   Lab Results  Component Value Date   ALT 11 02/21/2018   AST 15 02/21/2018   ALKPHOS 96 02/21/2018   BILITOT 0.4 02/21/2018         RESOLVED: Gastroesophageal reflux disease with esophagitis   History of fracture of femur    She has persistent pain on her left side from prior fractures.  Continue cymbalta and tramadol       Major depressive disorder with single episode    She has been more depressed due to the financial consequences of her alcoholism. Because it is also impacting her parents .  Her outlook has improved with the addition of cymbalta.       Relevant Medications   DULoxetine 40 MG CPEP   Osteoporosis    Petition for prolia has been obtained,  and she has received her mos recent dose last week.    History of left  demur and ankle  fracture during fall       Pernicious anemia    Patient has resumed monthly  B12 injections to be done at home by a friend. . Her intrinsic factor antibody test is positive.    Lab Results  Component Value Date   VITAMINB12 250 12/30/2015        Prediabetes    She has lowered her a1c to  5.9 and kept it low with reduction in dietary sugar.  Lab Results  Component Value Date   HGBA1C 5.9 02/21/2018         Relevant Orders   Hemoglobin A1c (Completed)   Comprehensive metabolic panel (Completed)    Other Visit Diagnoses    Vitamin D insufficiency       Relevant Orders  VITAMIN D 25 Hydroxy (Vit-D Deficiency, Fractures) (Completed)   B12 deficiency       Relevant Medications   Syringe/Needle, Disp, (SYRINGE 3CC/25GX1") 25G X 1" 3 ML MISC      I have changed Hatley K. Jacot's DULoxetine to DULoxetine HCl. I am also having her start on cyanocobalamin and SYRINGE 3CC/25GX1". Additionally, I am having her maintain her dicyclomine, cyclobenzaprine, omeprazole, gabapentin, and traMADol.  Meds ordered this encounter  Medications  . DULoxetine 40 MG CPEP    Sig: Take 40 mg by mouth daily.    Dispense:  90 capsule    Refill:  1  . cyanocobalamin (,VITAMIN B-12,) 1000 MCG/ML injection    Sig: Inject 1 mL (1,000 mcg total) into the muscle once for 1 dose.    Dispense:  10 mL    Refill:  0  . Syringe/Needle, Disp, (SYRINGE 3CC/25GX1") 25G X 1" 3 ML MISC    Sig: Use for b12 injections    Dispense:  50 each    Refill:  0    Medications Discontinued During This Encounter  Medication Reason  . traMADol (ULTRAM) 50 MG tablet Duplicate  . DULoxetine (CYMBALTA) 20 MG capsule     Follow-up: No follow-ups on file.   Crecencio Mc, MD

## 2018-02-22 DIAGNOSIS — K2100 Gastro-esophageal reflux disease with esophagitis, without bleeding: Secondary | ICD-10-CM | POA: Insufficient documentation

## 2018-02-22 DIAGNOSIS — K5730 Diverticulosis of large intestine without perforation or abscess without bleeding: Secondary | ICD-10-CM | POA: Insufficient documentation

## 2018-02-22 DIAGNOSIS — N261 Atrophy of kidney (terminal): Secondary | ICD-10-CM | POA: Insufficient documentation

## 2018-02-22 DIAGNOSIS — Z720 Tobacco use: Secondary | ICD-10-CM | POA: Insufficient documentation

## 2018-02-22 NOTE — Assessment & Plan Note (Signed)
By CT scan ordered by Bronson Ing in 2016 . Workup unclear

## 2018-02-22 NOTE — Assessment & Plan Note (Signed)
She has persistent pain on her left side from prior fractures.  Continue cymbalta and tramadol

## 2018-02-22 NOTE — Assessment & Plan Note (Signed)
She has lowered her a1c to  5.9 and kept it low with reduction in dietary sugar.  Lab Results  Component Value Date   HGBA1C 5.9 02/21/2018

## 2018-02-22 NOTE — Assessment & Plan Note (Signed)
Pain was suboptimally controlled on tramadol and gabapentin.  Trial of cymbalta has helped  for pain management bur she remains unable to sit or stand for more than 15 minutes without having intolerable pain that requries change in position and rest. .  Letter of support for disability written.

## 2018-02-22 NOTE — Assessment & Plan Note (Signed)

## 2018-02-22 NOTE — Assessment & Plan Note (Signed)
Patient has resumed monthly  B12 injections to be done at home by a friend. . Her intrinsic factor antibody test is positive.    Lab Results  Component Value Date   VITAMINB12 250 12/30/2015

## 2018-02-22 NOTE — Assessment & Plan Note (Signed)
She has been more depressed due to the financial consequences of her alcoholism. Because it is also impacting her parents .  Her outlook has improved with the addition of cymbalta.

## 2018-02-22 NOTE — Assessment & Plan Note (Signed)
Petition for prolia has been obtained,  and she has received her mos recent dose last week.   History of left  demur and ankle  fracture during fall

## 2018-02-22 NOTE — Assessment & Plan Note (Signed)
By ultrasound.  LFTs are normal and she has Has been alcohol abstinent for over two years .   Lab Results  Component Value Date   ALT 11 02/21/2018   AST 15 02/21/2018   ALKPHOS 96 02/21/2018   BILITOT 0.4 02/21/2018

## 2018-02-25 ENCOUNTER — Telehealth: Payer: Self-pay | Admitting: Internal Medicine

## 2018-02-25 NOTE — Telephone Encounter (Signed)
Spoke with patient this afternoon in office she will have to pay out of pocket if she continues at this office.

## 2018-02-25 NOTE — Telephone Encounter (Signed)
Copied from [place] [phone]. Topic: Quick Communication - See Telephone Encounter >> Feb 25, 2018  9:40 AM Burchel, Abbi R wrote: CRM for notification. See Telephone encounter for: 02/25/18.  [facility] calling to confirm that Dr Derrel Nip is in Network with a specific plan. Please call pt to discuss.   [phone]

## 2018-03-08 ENCOUNTER — Other Ambulatory Visit: Payer: Self-pay | Admitting: Internal Medicine

## 2018-03-09 NOTE — Telephone Encounter (Signed)
Refilled: 09/13/2017 Last OV: 02/21/2018 Next OV: not scheduled

## 2018-05-16 ENCOUNTER — Ambulatory Visit (INDEPENDENT_AMBULATORY_CARE_PROVIDER_SITE_OTHER): Payer: BLUE CROSS/BLUE SHIELD | Admitting: Family Medicine

## 2018-05-16 ENCOUNTER — Encounter: Payer: Self-pay | Admitting: Family Medicine

## 2018-05-16 DIAGNOSIS — F1021 Alcohol dependence, in remission: Secondary | ICD-10-CM | POA: Diagnosis not present

## 2018-05-16 DIAGNOSIS — D510 Vitamin B12 deficiency anemia due to intrinsic factor deficiency: Secondary | ICD-10-CM

## 2018-05-16 DIAGNOSIS — Z720 Tobacco use: Secondary | ICD-10-CM

## 2018-05-16 DIAGNOSIS — M810 Age-related osteoporosis without current pathological fracture: Secondary | ICD-10-CM

## 2018-05-16 DIAGNOSIS — R7303 Prediabetes: Secondary | ICD-10-CM | POA: Diagnosis not present

## 2018-05-16 DIAGNOSIS — K089 Disorder of teeth and supporting structures, unspecified: Secondary | ICD-10-CM

## 2018-05-16 DIAGNOSIS — F330 Major depressive disorder, recurrent, mild: Secondary | ICD-10-CM

## 2018-05-16 DIAGNOSIS — G621 Alcoholic polyneuropathy: Secondary | ICD-10-CM

## 2018-05-16 MED ORDER — AMOXICILLIN 875 MG PO TABS: 875.0000 mg | ORAL_TABLET | Freq: Two times a day (BID) | ORAL | 0 refills | Status: AC

## 2018-05-16 NOTE — Patient Instructions (Signed)

## 2018-05-16 NOTE — Progress Notes (Signed)
Patient: Kathy [name], Female    DOB: Jul 12, 1960, 58 y.o.   MRN: [id number] Visit Date: 05/18/2018  Today's Provider: Lavon Paganini, MD   Chief Complaint  Patient presents with  . Establish Care   Subjective:     Virtual Visit via Video Note  I connected with Kathy [name] on 05/18/18 at  3:00 PM EDT by a video enabled telemedicine application and verified that I am speaking with the correct person using two identifiers.   I discussed the limitations of evaluation and management by telemedicine and the availability of in person appointments. The patient expressed understanding and agreed to proceed.   Patient location: home Provider location: [place] Patient:  Kathy [name] is a 58 y.o. female who presents today to Establish Care. Patient was previously a patient with Dr. Derrel Nip. She feels fairly well. She has neuropathy.  She reports exercising lightly. She reports she is sleeping fairly well.  Patient has h/o alcohol abuse.  She has been sober for 4 years.  She has resultant peripheral neuropathy.  She is taking gabapentin 859m TID which seems to minimize it and then takes Tramadol as needed - often 2-3 times daily.  She is applying for disability for this reason.  She has a letter in the chart from former PCP stating that it is difficult for her to work more than 4 hours at a time due to her chronic medical conditions  Patient smokes 0.75 PPD.  Not currently interested in quitting,  Thinks it is difficult, because she just quit drinking.    Has pernicious anemia (+IF).  Takes at home B12 shots every 2-4 weeks.    Taking Cymbalta for depression and anxiety.  Feels they are well controlled.  Denies SI/HI/AVH.  Has only been taking about qod, but will adjust to taking daily as directed  Osteoporosis: On Prolia (had 1 injection).  Beleieves she is due for her next injection in July.    Has significant tooth decay related to  previous alcoholism and not seeing a dentist for many years.  She is now seeing a dentist and they have a plan to get her a full set of dentures.  This is been put off due to the pandemic as it is not urgent.  She states that she does have what appears to be an abscess and pain around her tooth.  This has happened to her in the past.  She has had to take amoxicillin for this previously. -----------------------------------------------------------------   Review of Systems  Constitutional: Negative.   HENT: Negative.   Eyes: Negative.   Respiratory: Negative.   Cardiovascular: Negative.   Gastrointestinal: Negative.   Endocrine: Negative.   Genitourinary: Negative.   Musculoskeletal: Negative.   Skin: Negative.   Allergic/Immunologic: Negative.   Neurological: Negative.   Hematological: Negative.   Psychiatric/Behavioral: Negative.     Social History      She  reports that she has been smoking cigarettes. She has a 30.00 pack-year smoking history. She has never used smokeless tobacco. She reports that she does not drink alcohol or use drugs.       Social History   Socioeconomic History  . Marital status: Divorced    Spouse name: Not on file  . Number of children: Not on file  . Years of education: Not on file  . Highest education level: Not on file  Occupational History  . Not on file  Social Needs  .  Financial resource strain: Not on file  . Food insecurity:    Worry: Not on file    Inability: Not on file  . Transportation needs:    Medical: Not on file    Non-medical: Not on file  Tobacco Use  . Smoking status: Current Every Day Smoker    Packs/day: 0.75    Years: 40.00    Pack years: 30.00    Types: Cigarettes  . Smokeless tobacco: Never Used  . Tobacco comment: smokes one pack/week currently  Substance and Sexual Activity  . Alcohol use: No    Alcohol/week: 0.0 standard drinks    Comment: abstinent since october 2017  . Drug use: No  . Sexual activity: Not  Currently  Lifestyle  . Physical activity:    Days per week: Not on file    Minutes per session: Not on file  . Stress: Not on file  Relationships  . Social connections:    Talks on phone: Not on file    Gets together: Not on file    Attends religious service: Not on file    Active member of club or organization: Not on file    Attends meetings of clubs or organizations: Not on file    Relationship status: Not on file  Other Topics Concern  . Not on file  Social History Narrative  . Not on file    Past Medical History:  Diagnosis Date  . Alcohol abuse   . Chicken pox   . Colon polyps   . Depression   . Hernia of abdominal cavity   . Hip fracture ([place]) right  . Neuropathy      Patient Active Problem List   Diagnosis Date Noted  . Poor dentition 05/18/2018  . Tobacco abuse 02/22/2018  . 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 Diverticulosis of colon without diverticulitis 02/22/2018  . MDD (major depressive disorder) 09/13/2017  . Osteoporosis 10/17/2016  . Prediabetes 10/17/2016  . Pernicious anemia 01/01/2016  . 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 Hammertoe 07/31/2015  . 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 Fatty liver, alcoholic 35/00/9381  . History of alcohol dependence ([place]) 07/13/2014    No past surgical history on file.  Family History        Family Status  Relation Name Status  . Mother  Alive  . Father  Alive  . MGM  Deceased  . MGF  Deceased  . PGM  Deceased  . PGF  Deceased  . Other  (Not Specified)        Her family history includes Alcohol abuse in an other family member; Cancer (age of onset: 24) in her father; Colon cancer in her paternal grandmother; Hypertension in her mother; Meniere's disease in her mother; Osteoporosis in her mother.      Allergies  Allergen Reactions  . Lyrica [Pregabalin] Other (See Comments)    Blurred vision      Current Outpatient Medications:  .  Cyanocobalamin 1000  MCG/ML KIT, Inject 1,000 mcg as directed every 14 (fourteen) days., Disp: , Rfl:  .  cyclobenzaprine (FLEXERIL) 10 MG tablet, Take 1 tablet (10 mg total) by mouth 3 (three) times daily as needed for muscle spasms. Involving the legs and feet, Disp: 90 tablet, Rfl: 1 .  dicyclomine (BENTYL) 20 MG tablet, Take 1 tablet (20 mg total) by mouth every 6 (six) hours as needed for spasms., Disp: 120 tablet, Rfl: 5 .  DULoxetine 40 MG CPEP, Take 40  mg by mouth daily., Disp: 90 capsule, Rfl: 1 .  gabapentin (NEURONTIN) 400 MG capsule, Take 2 capsules (800 mg total) by mouth 3 (three) times daily., Disp: 180 capsule, Rfl: 5 .  omeprazole (PRILOSEC) 40 MG capsule, Take 1 capsule (40 mg total) by mouth 2 (two) times daily., Disp: 180 capsule, Rfl: 1 .  Syringe/Needle, Disp, (SYRINGE 3CC/25GX1") 25G X 1" 3 ML MISC, Use for b12 injections, Disp: 50 each, Rfl: 0 .  traMADol (ULTRAM) 50 MG tablet, TAKE 1 TABLET BY MOUTH EVERY 6 HOURS AS NEEDED, Disp: 90 tablet, Rfl: 5 .  amoxicillin (AMOXIL) 875 MG tablet, Take 1 tablet (875 mg total) by mouth 2 (two) times daily for 7 days., Disp: 14 tablet, Rfl: 0   Patient Care Team: Virginia Crews, MD as PCP - General (Family Medicine)    Objective:      Physical Exam Constitutional:      General: She is not in acute distress.    Appearance: Normal appearance. She is not diaphoretic.  HENT:     Head: Normocephalic and atraumatic.  Eyes:     General: No scleral icterus.    Conjunctiva/sclera: Conjunctivae normal.  Pulmonary:     Effort: Pulmonary effort is normal. No respiratory distress.  Neurological:     Mental Status: She is alert and oriented to person, place, and time.  Psychiatric:        Mood and Affect: Mood normal.        Behavior: Behavior normal.        Thought Content: Thought content normal.      Depression Screen PHQ 2/9 Scores 05/16/2018 09/13/2017 04/05/2017  PHQ - 2 Score '1 3 1  ' PHQ- 9 Score '3 8 2      ' Assessment & Plan:      Establish care  Exercise Activities and Dietary recommendations Goals   None     Immunization History  Administered Date(s) Administered  . Hep A / Hep B 10/11/2014, 11/14/2014, 04/22/2015  . PPD Test 09/16/2015    Health Maintenance  Topic Date Due  . TETANUS/TDAP  12/25/1980  . INFLUENZA VACCINE  05/24/2018 (Originally 09/23/2017)  . PAP SMEAR-Modifier  10/15/2019  . MAMMOGRAM  11/13/2019  . COLONOSCOPY  02/01/2023  . Hepatitis C Screening  Completed  . HIV Screening  Completed     Discussed health benefits of physical activity, and encouraged her to engage in regular exercise appropriate for her age and condition.    --------------------------------------------------------------------  Problem List Items Addressed This Visit      Digestive   Poor dentition    Poor dentition with possible abscess We will give dose of amoxicillin Discussed that if this worsens, that she can see a dentist for emergent care currently        Nervous and Auditory   Alcoholic peripheral neuropathy (HCC) - Primary    Continue Cymbalta, gabapentin, tramadol at current dose Will not plan to escalate her tramadol dose or pain medications in the future She is trying to get disability currently This does limit her ability to sit or stand for more than 15 minutes without having intolerable pain that requires change in position and rest Can provide a letter of support for disability in the future if needed        Musculoskeletal and Integument   Osteoporosis    Patient was recently approved for Prolia and had her first dose of this Discussed that we do not do Prolia in our office currently  She does have a history of left femur and ankle fracture from a fall She will be referred to endocrinology for ongoing management      Relevant Orders   Ambulatory referral to Endocrinology     Other   History of alcohol dependence ([place])    Patient reports 4 years of sobriety Encouraged her to  continue to attend AA and to be mindful of the disease state      Pernicious anemia    Patient continues monthly B12 injections at home that are done by friends Her intrinsic factor antibody test was positive Plan to recheck B12 next visit with other labs      Relevant Medications   Cyanocobalamin 1000 MCG/ML KIT   Prediabetes    Discussed low-carb diet Plan to recheck A1c at next visit with other labs      MDD (major depressive disorder)    She reports that her depression is currently well controlled and her PHQ 9 score is low Continue Cymbalta at current dose Encourage therapy      Tobacco abuse    Discussed importance of cessation Patient remains pre-contemplative          Return in about 3 months (around 08/16/2018) for chronic disease f/u.   The entirety of the information documented in the History of Present Illness, Review of Systems and Physical Exam were personally obtained by me. Portions of this information were initially documented by Tiburcio Pea, CMA and reviewed by me for thoroughness and accuracy.    Follow Up Instructions:  I discussed the assessment and treatment plan with the patient. The patient was provided an opportunity to ask questions and all were answered. The patient agreed with the plan and demonstrated an understanding of the instructions.   The patient was advised to call back or seek an in-person evaluation if the symptoms worsen or if the condition fails to improve as anticipated.  I provided 45 minutes of non-face-to-face time during this encounter.   Virginia Crews, MD, MPH [facility] 05/18/2018 10:21 AM

## 2018-05-18 ENCOUNTER — Encounter: Payer: Self-pay | Admitting: Family Medicine

## 2018-05-18 DIAGNOSIS — K089 Disorder of teeth and supporting structures, unspecified: Secondary | ICD-10-CM | POA: Insufficient documentation

## 2018-05-18 NOTE — Assessment & Plan Note (Signed)
She reports that her depression is currently well controlled and her PHQ 9 score is low Continue Cymbalta at current dose Encourage therapy

## 2018-05-18 NOTE — Assessment & Plan Note (Signed)
Poor dentition with possible abscess We will give dose of amoxicillin Discussed that if this worsens, that she can see a dentist for emergent care currently

## 2018-05-18 NOTE — Assessment & Plan Note (Signed)
Patient was recently approved for Prolia and had her first dose of this Discussed that we do not do Prolia in our office currently She does have a history of left femur and ankle fracture from a fall She will be referred to endocrinology for ongoing management

## 2018-05-18 NOTE — Assessment & Plan Note (Signed)
Discussed importance of cessation Patient remains pre-contemplative 

## 2018-05-18 NOTE — Assessment & Plan Note (Signed)
Discussed low-carb diet Plan to recheck A1c at next visit with other labs

## 2018-05-18 NOTE — Assessment & Plan Note (Signed)
Patient continues monthly B12 injections at home that are done by friends Her intrinsic factor antibody test was positive Plan to recheck B12 next visit with other labs

## 2018-05-18 NOTE — Assessment & Plan Note (Signed)
Continue Cymbalta, gabapentin, tramadol at current dose Will not plan to escalate her tramadol dose or pain medications in the future She is trying to get disability currently This does limit her ability to sit or stand for more than 15 minutes without having intolerable pain that requires change in position and rest Can provide a letter of support for disability in the future if needed

## 2018-05-18 NOTE — Assessment & Plan Note (Signed)
Patient reports 4 years of sobriety Encouraged her to continue to attend AA and to be mindful of the disease state

## 2018-06-10 ENCOUNTER — Telehealth: Payer: Self-pay

## 2018-06-10 NOTE — Telephone Encounter (Signed)
Patient states that she recently had web visit with Dr. Jacinto Reap and had discussed Dr. Jacinto Reap writing detailed letter stating patients medical history and condition to give to her lawyer for disability. Patient is requesting that we email her letter to [email or web address]

## 2018-06-13 ENCOUNTER — Encounter: Payer: Self-pay | Admitting: Family Medicine

## 2018-06-13 NOTE — Telephone Encounter (Signed)
The letter is available in chart review and I believe that I printed it to the back printer.

## 2018-06-15 NOTE — Telephone Encounter (Signed)
Sure. Typically no, but restrictions are lax due to pandemic

## 2018-06-15 NOTE — Telephone Encounter (Signed)
Letter e-mailed to patient.

## 2018-07-06 ENCOUNTER — Other Ambulatory Visit: Payer: Self-pay | Admitting: Family Medicine

## 2018-07-06 NOTE — Telephone Encounter (Signed)
Pt called needing a refill on her B12 solution that she gives herself by a shot  [facility]  CB#  [phone]  Thanks Con Memos

## 2018-07-07 MED ORDER — CYANOCOBALAMIN 1000 MCG/ML IJ KIT: 1000.0000 ug | PACK | INTRAMUSCULAR | 5 refills | Status: DC

## 2018-07-27 ENCOUNTER — Telehealth: Payer: Self-pay

## 2018-07-27 NOTE — Telephone Encounter (Signed)
Patient called stating that [facility] Endocrinologist that we referred her to for Prolia injections requires her to have labs that are going to cost her over $100. Patient states he cannot afford these labs right now, and she really needs that injection. Patient says the Endocrinologist is going to fax  a lab slip to our office. Patient wants to know if she could have these labs done at our office and have the results faxed to Endocrinology? She says that she only has to pay a $5 co pay at our office when she has labs done. Please advise.

## 2018-07-27 NOTE — Telephone Encounter (Signed)
Sure, she could have them done here.  I don't know how it will be different copay, but if it is that's fine

## 2018-07-28 NOTE — Telephone Encounter (Signed)
Patient advised.

## 2018-08-03 ENCOUNTER — Other Ambulatory Visit: Payer: Self-pay | Admitting: Family Medicine

## 2018-08-03 DIAGNOSIS — G629 Polyneuropathy, unspecified: Secondary | ICD-10-CM

## 2018-08-03 DIAGNOSIS — M810 Age-related osteoporosis without current pathological fracture: Secondary | ICD-10-CM

## 2018-08-12 LAB — PHOSPHORUS: Phosphorus: 4.7 mg/dL — ABNORMAL HIGH (ref 3.0–4.3)

## 2018-08-12 LAB — PARATHYROID HORMONE, INTACT (NO CA): PTH: 26 pg/mL (ref 15–65)

## 2018-08-12 LAB — TSH: TSH: 1.29 u[IU]/mL (ref 0.450–4.500)

## 2018-08-12 LAB — ALKALINE PHOSPHATASE: Alkaline Phosphatase: 67 IU/L (ref 39–117)

## 2018-08-12 LAB — CALCIUM: Calcium: 9.2 mg/dL (ref 8.7–10.2)

## 2018-08-12 LAB — ALBUMIN: Albumin: 4.2 g/dL (ref 3.8–4.9)

## 2018-08-12 LAB — CREATININE, SERUM
Creatinine, Ser: 0.61 mg/dL (ref 0.57–1.00)
GFR calc Af Amer: 117 mL/min/{1.73_m2} (ref >59)
GFR calc non Af Amer: 102 mL/min/{1.73_m2} (ref >59)

## 2018-08-12 LAB — VITAMIN B12: Vitamin B-12: 1142 pg/mL (ref 232–1245)

## 2018-08-12 LAB — VITAMIN D 25 HYDROXY (VIT D DEFICIENCY, FRACTURES): Vit D, 25-Hydroxy: 54.9 ng/mL (ref 30.0–100.0)

## 2018-08-15 ENCOUNTER — Telehealth: Payer: Self-pay

## 2018-08-15 NOTE — Telephone Encounter (Signed)
-----   Message from Birdie Sons, MD sent at 08/14/2018  3:18 PM EDT ----- Labs look. Looks like they were requested by her endocrinologist at [place]. Please advise patient that her endocrinologist should be able to view results in [place].

## 2018-08-15 NOTE — Telephone Encounter (Signed)
Pt advised.   Thanks,   -Samael Blades  

## 2018-08-23 ENCOUNTER — Telehealth: Payer: Self-pay | Admitting: *Deleted

## 2018-08-23 NOTE — Telephone Encounter (Signed)
Patient called requesting a letter stating she is high risk for hip fracture. Patient stated Dr. Jacinto Reap can e-mail her the letter when finished. Patient needs letter sometime before August. Please advise?

## 2018-08-24 ENCOUNTER — Encounter: Payer: Self-pay | Admitting: Family Medicine

## 2018-08-24 NOTE — Telephone Encounter (Signed)
Letter updated to reflect risk of fracture. Patient can pick up at her convenience

## 2018-08-24 NOTE — Telephone Encounter (Signed)
What does she need this letter for? I just want to be sure I include all that she needs in it.  She does have osteoporosis and therefore is at risk for hip fracture or any other fracture.

## 2018-08-24 NOTE — Telephone Encounter (Signed)
Patient states that you wrote a great letter 06/13/2018 but need a small paragraph stating that she is a high risk for bones and she can not perform certain jobs. She states that she has had increase in anxiety/depression. Patient states that she is unable to stay focus at the moment. Patient schedule appointment with you on 09/19/2018 @ 3:40 PM.

## 2018-08-24 NOTE — Telephone Encounter (Signed)
Patient was advised.  

## 2018-09-19 ENCOUNTER — Other Ambulatory Visit: Payer: Self-pay

## 2018-09-19 ENCOUNTER — Ambulatory Visit (INDEPENDENT_AMBULATORY_CARE_PROVIDER_SITE_OTHER): Payer: BLUE CROSS/BLUE SHIELD | Admitting: Family Medicine

## 2018-09-19 ENCOUNTER — Encounter: Payer: Self-pay | Admitting: Family Medicine

## 2018-09-19 VITALS — Wt 107.0 lb

## 2018-09-19 DIAGNOSIS — M810 Age-related osteoporosis without current pathological fracture: Secondary | ICD-10-CM | POA: Diagnosis not present

## 2018-09-19 DIAGNOSIS — F411 Generalized anxiety disorder: Secondary | ICD-10-CM | POA: Insufficient documentation

## 2018-09-19 DIAGNOSIS — R7303 Prediabetes: Secondary | ICD-10-CM

## 2018-09-19 DIAGNOSIS — F330 Major depressive disorder, recurrent, mild: Secondary | ICD-10-CM

## 2018-09-19 DIAGNOSIS — G621 Alcoholic polyneuropathy: Secondary | ICD-10-CM

## 2018-09-19 DIAGNOSIS — K700 Alcoholic fatty liver: Secondary | ICD-10-CM | POA: Diagnosis not present

## 2018-09-19 NOTE — Progress Notes (Signed)
Patient: Kathy [name] Female    DOB: Jan 23, 1961   58 y.o.   MRN: [id number] Visit Date: 09/19/2018  Today's Provider: Lavon Paganini, MD   Chief Complaint  Patient presents with  . Follow-up    anxiety   Subjective:    I,Kathy [name],RMA am acting as a scribe for Lavon Paganini, MD  Virtual Visit via Video Note  I connected with Kathy [name] on 09/19/18 at  3:40 PM EDT by a video enabled telemedicine application and verified that I am speaking with the correct person using two identifiers.   Patient location: home Provider location: home office  Persons involved in the visit: patient, provider    I discussed the limitations of evaluation and management by telemedicine and the availability of in person appointments. The patient expressed understanding and agreed to proceed.    HPI Patient with c/o disability and follow-up anxiety. Reports that overall her anxiety is everywhere but she knows where that's coming from but when she takes the Cymbalta she feels better. Hours at work were cut due to [place].  This has caused more stress, but she feels like Cymbalta is helping.  She is working with attorney to file for disability. She is grateful for the letters written detailing her medical conditions.  She is followed by Endocrinology for her osteoporosis and was able to resume Prolia injections  GAD 7 : Generalized Anxiety Score 09/19/2018  Nervous, Anxious, on Edge 3  Control/stop worrying 3  Worry too much - different things 3  Trouble relaxing 2  Restless 3  Easily annoyed or irritable 2  Afraid - awful might happen 3  Total GAD 7 Score 19  Anxiety Difficulty Very difficult   Depression screen [facility] 2/9 09/19/2018 05/16/2018 09/13/2017  Decreased Interest 2 0 1  Down, Depressed, Hopeless _0 PHQ - 2 Score _1 Altered sleeping 1 0 0  Tired, decreased energy 1 0 0  Change in appetite 0 0 0  Feeling bad or failure about yourself  0 1 1   Trouble concentrating 0 1 2  Moving slowly or fidgety/restless 0 0 2  Suicidal thoughts 0 0 0  PHQ-9 Score _2 Difficult doing work/chores Somewhat difficult Somewhat difficult Somewhat difficult    Allergies  Allergen Reactions  . Lyrica [Pregabalin] Other (See Comments)    Blurred vision      Current Outpatient Medications:  .  Cyanocobalamin 1000 MCG/ML KIT, Inject 1,000 mcg as directed every 14 (fourteen) days., Disp: 1 kit, Rfl: 5 .  cyclobenzaprine (FLEXERIL) 10 MG tablet, Take 1 tablet (10 mg total) by mouth 3 (three) times daily as needed for muscle spasms. Involving the legs and feet, Disp: 90 tablet, Rfl: 1 .  dicyclomine (BENTYL) 20 MG tablet, Take 1 tablet (20 mg total) by mouth every 6 (six) hours as needed for spasms., Disp: 120 tablet, Rfl: 5 .  DULoxetine 40 MG CPEP, Take 40 mg by mouth daily., Disp: 90 capsule, Rfl: 1 .  gabapentin (NEURONTIN) 400 MG capsule, Take 2 capsules (800 mg total) by mouth 3 (three) times daily., Disp: 180 capsule, Rfl: 5 .  omeprazole (PRILOSEC) 40 MG capsule, Take 1 capsule (40 mg total) by mouth 2 (two) times daily., Disp: 180 capsule, Rfl: 1 .  Syringe/Needle, Disp, (SYRINGE 3CC/25GX1") 25G X 1" 3 ML MISC, Use for b12 injections, Disp: 50 each, Rfl: 0 .  traMADol (ULTRAM) 50 MG tablet, TAKE  1 TABLET BY MOUTH EVERY 6 HOURS AS NEEDED, Disp: 90 tablet, Rfl: 5  Review of Systems  Constitutional: Negative for appetite change, chills, fatigue and fever.  Respiratory: Negative for chest tightness and shortness of breath.   Cardiovascular: Negative for chest pain and palpitations.  Gastrointestinal: Negative for abdominal pain, nausea and vomiting.  Neurological: Negative for dizziness and weakness.    Social History   Tobacco Use  . Smoking status: Current Every Day Smoker    Packs/day: 0.75    Years: 40.00    Pack years: 30.00    Types: Cigarettes  . Smokeless tobacco: Never Used  . Tobacco comment: smokes one pack/week currently   Substance Use Topics  . Alcohol use: No    Alcohol/week: 0.0 standard drinks    Comment: abstinent since october 2017      Objective:   There were no vitals taken for this visit. There were no vitals filed for this visit.   Physical Exam Constitutional:      Appearance: Normal appearance.  HENT:     Head: Normocephalic and atraumatic.  Eyes:     General: No scleral icterus.    Conjunctiva/sclera: Conjunctivae normal.  Pulmonary:     Effort: Pulmonary effort is normal. No respiratory distress.  Neurological:     Mental Status: She is alert and oriented to person, place, and time. Mental status is at baseline.  Psychiatric:        Mood and Affect: Mood normal.        Behavior: Behavior normal.        Thought Content: Thought content normal.      No results found for any visits on 09/19/18.     Assessment & Plan    I discussed the assessment and treatment plan with the patient. The patient was provided an opportunity to ask questions and all were answered. The patient agreed with the plan and demonstrated an understanding of the instructions.   The patient was advised to call back or seek an in-person evaluation if the symptoms worsen or if the condition fails to improve as anticipated.  Problem List Items Addressed This Visit      Digestive   Fatty liver, alcoholic    Diagnosed by ultrasound Recheck LFTs Has been alcohol abstinent for nearly 3 yrs      Relevant Orders   Comprehensive metabolic panel     Nervous and Auditory   Alcoholic peripheral neuropathy (HCC)    Chronic and stable Continue cymbalta, gabapentin, tramadol at current doses Will not plan to escalate opioids in the future Working towards disability currently        Musculoskeletal and Integument   Osteoporosis    Followed by Endocrinology  On Prolia q83mHas h/o L femur and ankle fracture from fall        Other   Prediabetes - Primary    Discussed low carb diet Recheck A1c       Relevant Orders   Lipid panel   Hemoglobin A1c   MDD (major depressive disorder)    Chronic, well controlled Continue Cymbalta encouraged therapy      GAD (generalized anxiety disorder)    Chronic Fluctuating due to current stressors and the pandemic Continue Cymbalta Avoid benzos given h/o alcohol abuse          Return in about 6 months (around 03/22/2019) for CPE, MDD/GAD f/u.   The entirety of the information documented in the History of Present Illness, Review of Systems and  Physical Exam were personally obtained by me. Portions of this information were initially documented by Lyndel Pleasure, CMA and reviewed by me for thoroughness and accuracy.    , Dionne Bucy, MD MPH [facility]

## 2018-09-21 NOTE — Assessment & Plan Note (Signed)
Chronic Fluctuating due to current stressors and the pandemic Continue Cymbalta Avoid benzos given h/o alcohol abuse

## 2018-09-21 NOTE — Assessment & Plan Note (Signed)
Chronic and stable Continue cymbalta, gabapentin, tramadol at current doses Will not plan to escalate opioids in the future Working towards disability currently

## 2018-09-21 NOTE — Assessment & Plan Note (Signed)
Discussed low-carb diet Recheck A1c 

## 2018-09-21 NOTE — Assessment & Plan Note (Signed)
Diagnosed by ultrasound Recheck LFTs Has been alcohol abstinent for nearly 3 yrs

## 2018-09-21 NOTE — Assessment & Plan Note (Signed)
Chronic, well controlled Continue Cymbalta encouraged therapy

## 2018-09-21 NOTE — Assessment & Plan Note (Signed)
Followed by Endocrinology  On Prolia q69m Has h/o L femur and ankle fracture from fall

## 2018-09-29 ENCOUNTER — Telehealth: Payer: Self-pay

## 2018-09-29 NOTE — Telephone Encounter (Signed)
Patient says that her lawyer is requesting more detailed medical notes that is needed for her case. Please advise.

## 2018-09-29 NOTE — Telephone Encounter (Signed)
We will need to know what exactly she needs.  She may be able to sign an ROI and gets records that way.

## 2018-09-30 ENCOUNTER — Telehealth: Payer: Self-pay

## 2018-09-30 NOTE — Telephone Encounter (Signed)
Left message on pt's vm advising her to cb and schedule an appointment.

## 2018-09-30 NOTE — Telephone Encounter (Signed)
Yes she needs appt for those things.

## 2018-09-30 NOTE — Telephone Encounter (Signed)
Patient saw Dr. Jacinto Reap for virtual visit last week and said she forgot to mention that she was having left hip pain and itching. She states that it has been going on for about a month. Does she need to schedule another appointment? Please advise.

## 2018-10-03 ENCOUNTER — Other Ambulatory Visit: Payer: Self-pay | Admitting: Internal Medicine

## 2018-10-03 ENCOUNTER — Telehealth: Payer: Self-pay | Admitting: Family Medicine

## 2018-10-04 ENCOUNTER — Telehealth: Payer: Self-pay

## 2018-10-04 LAB — COMPREHENSIVE METABOLIC PANEL
ALT: 12 IU/L (ref 0–32)
AST: 18 IU/L (ref 0–40)
Albumin/Globulin Ratio: 1.7 (ref 1.2–2.2)
Albumin: 4.3 g/dL (ref 3.8–4.9)
Alkaline Phosphatase: 71 IU/L (ref 39–117)
BUN/Creatinine Ratio: 19 (ref 9–23)
BUN: 13 mg/dL (ref 6–24)
Bilirubin Total: 0.4 mg/dL (ref 0.0–1.2)
CO2: 23 mmol/L (ref 20–29)
Calcium: 9.5 mg/dL (ref 8.7–10.2)
Chloride: 98 mmol/L (ref 96–106)
Creatinine, Ser: 0.7 mg/dL (ref 0.57–1.00)
GFR calc Af Amer: 112 mL/min/{1.73_m2} (ref >59)
GFR calc non Af Amer: 97 mL/min/{1.73_m2} (ref >59)
Globulin, Total: 2.6 g/dL (ref 1.5–4.5)
Glucose: 96 mg/dL (ref 65–99)
Potassium: 4.8 mmol/L (ref 3.5–5.2)
Sodium: 138 mmol/L (ref 134–144)
Total Protein: 6.9 g/dL (ref 6.0–8.5)

## 2018-10-04 LAB — LIPID PANEL
Chol/HDL Ratio: 2 ratio (ref 0.0–4.4)
Cholesterol, Total: 187 mg/dL (ref 100–199)
HDL: 95 mg/dL (ref >39)
LDL Calculated: 75 mg/dL (ref 0–99)
Triglycerides: 86 mg/dL (ref 0–149)
VLDL Cholesterol Cal: 17 mg/dL (ref 5–40)

## 2018-10-04 LAB — HEMOGLOBIN A1C
Est. average glucose Bld gHb Est-mCnc: 108 mg/dL
Hgb A1c MFr Bld: 5.4 % (ref 4.8–5.6)

## 2018-10-04 NOTE — Telephone Encounter (Signed)
Patient was advised.  

## 2018-10-04 NOTE — Telephone Encounter (Signed)
-----   Message from Virginia Crews, MD sent at 10/04/2018  9:18 AM EDT ----- Normal labs

## 2018-10-05 ENCOUNTER — Other Ambulatory Visit: Payer: Self-pay

## 2018-10-05 ENCOUNTER — Ambulatory Visit (INDEPENDENT_AMBULATORY_CARE_PROVIDER_SITE_OTHER): Payer: BLUE CROSS/BLUE SHIELD | Admitting: Family Medicine

## 2018-10-05 ENCOUNTER — Encounter: Payer: Self-pay | Admitting: Family Medicine

## 2018-10-05 ENCOUNTER — Telehealth: Payer: Self-pay | Admitting: Family Medicine

## 2018-10-05 ENCOUNTER — Other Ambulatory Visit: Payer: Self-pay | Admitting: Family Medicine

## 2018-10-05 VITALS — BP 128/80 | HR 80 | Temp 98.2°F | Wt 108.6 lb

## 2018-10-05 DIAGNOSIS — G621 Alcoholic polyneuropathy: Secondary | ICD-10-CM | POA: Diagnosis not present

## 2018-10-05 DIAGNOSIS — M7062 Trochanteric bursitis, left hip: Secondary | ICD-10-CM

## 2018-10-05 DIAGNOSIS — K089 Disorder of teeth and supporting structures, unspecified: Secondary | ICD-10-CM | POA: Diagnosis not present

## 2018-10-05 NOTE — Telephone Encounter (Signed)
Rx sent 

## 2018-10-05 NOTE — Telephone Encounter (Signed)
Pt needing refilled today please.  traMADol (ULTRAM) 50 MG tablet  Please fill TODAY at:   [place], [place] - [place] [phone] (Phone) [phone] (Fax)   Thanks, [employer]

## 2018-10-05 NOTE — Assessment & Plan Note (Signed)
Chronic and stable Continue Cymbalta, gabapentin, tramadol at current doses Working towards disability

## 2018-10-05 NOTE — Progress Notes (Signed)
Patient: Kathy [name] Female    DOB: 1960/07/31   58 y.o.   MRN: [id number] Visit Date: 10/05/2018  Today's Provider: Lavon Paganini, MD   Chief Complaint  Patient presents with  . Hip Pain  . Dental Problem   Subjective:    I, Porsha McClurkin CMA, am acting as a scribe for Lavon Paganini, MD.   Hip Pain  The incident occurred more than 1 week ago. There was no injury mechanism. The pain is present in the left hip. The quality of the pain is described as burning. The pain is at a severity of 8/10. The pain is moderate. The pain has been fluctuating since onset. Associated symptoms include an inability to bear weight. The symptoms are aggravated by movement and weight bearing. She has tried non-weight bearing for the symptoms. The treatment provided mild relief.   Stiff in AM and limps for first 30 minutes when first getting up Pain is burning in nature Worse with bending over and laying on that side Pain radiates to lower back occasionally No groin pain  Neuropathy in bilateral lower extremities up to mid calf    Dental Problem Patient states that some of her teeth has came out and she has ordered some teeth to glue in. She states that the dentist stated to her that her teeth are decaying.  She was told that she has significant decay due to GERD.  She cannot afford   Allergies  Allergen Reactions  . Lyrica [Pregabalin] Other (See Comments)    Blurred vision      Current Outpatient Medications:  .  Cyanocobalamin 1000 MCG/ML KIT, Inject 1,000 mcg as directed every 14 (fourteen) days., Disp: 1 kit, Rfl: 5 .  cyclobenzaprine (FLEXERIL) 10 MG tablet, Take 1 tablet (10 mg total) by mouth 3 (three) times daily as needed for muscle spasms. Involving the legs and feet, Disp: 90 tablet, Rfl: 1 .  dicyclomine (BENTYL) 20 MG tablet, Take 1 tablet (20 mg total) by mouth every 6 (six) hours as needed for spasms., Disp: 120 tablet, Rfl: 5 .  DULoxetine 40 MG CPEP, Take  40 mg by mouth daily., Disp: 90 capsule, Rfl: 1 .  gabapentin (NEURONTIN) 400 MG capsule, Take 2 capsules (800 mg total) by mouth 3 (three) times daily., Disp: 180 capsule, Rfl: 5 .  omeprazole (PRILOSEC) 40 MG capsule, Take 1 capsule (40 mg total) by mouth 2 (two) times daily., Disp: 180 capsule, Rfl: 1 .  Syringe/Needle, Disp, (SYRINGE 3CC/25GX1") 25G X 1" 3 ML MISC, Use for b12 injections, Disp: 50 each, Rfl: 0 .  traMADol (ULTRAM) 50 MG tablet, TAKE 1 TABLET BY MOUTH EVERY 6 HOURS AS NEEDED, Disp: 90 tablet, Rfl: 5  Review of Systems  Constitutional: Negative.   Respiratory: Negative.   Cardiovascular: Negative.   Musculoskeletal: Positive for back pain.       Hip pain     Social History   Tobacco Use  . Smoking status: Current Every Day Smoker    Packs/day: 0.75    Years: 40.00    Pack years: 30.00    Types: Cigarettes  . Smokeless tobacco: Never Used  . Tobacco comment: smokes one pack/week currently  Substance Use Topics  . Alcohol use: No    Alcohol/week: 0.0 standard drinks    Comment: abstinent since october 2017      Objective:   BP 128/80 (BP Location: Left Arm, Patient Position: Sitting, Cuff Size: Normal)   Pulse 80  Temp 98.2 F (36.8 C) (Oral)   Wt 108 lb 9.6 oz (49.3 kg)   SpO2 97%   BMI 20.52 kg/m  Vitals:   10/05/18 1107  BP: 128/80  Pulse: 80  Temp: 98.2 F (36.8 C)  TempSrc: Oral  SpO2: 97%  Weight: 108 lb 9.6 oz (49.3 kg)     Physical Exam Constitutional:      General: She is not in acute distress.    Appearance: Normal appearance. She is not diaphoretic.  HENT:     Head: Normocephalic and atraumatic.     Mouth/Throat:     Comments: Very poor dentition with multiple missing and rotting teeth. Upper veneers glued in place Cardiovascular:     Rate and Rhythm: Normal rate and regular rhythm.     Pulses: Normal pulses.     Heart sounds: Normal heart sounds. No murmur.  Pulmonary:     Effort: Pulmonary effort is normal. No respiratory  distress.     Breath sounds: Normal breath sounds. No wheezing.  Musculoskeletal:     Right lower leg: No edema.     Left lower leg: No edema.     Comments: L hip: Normal ROM. Strength in bilateral LEs 4/5 throughout. TTP over L greater trochanter.   Skin:    General: Skin is warm and dry.     Findings: No rash.  Neurological:     Mental Status: She is alert and oriented to person, place, and time.     Sensory: Sensory deficit present.  Psychiatric:        Mood and Affect: Mood normal.        Behavior: Behavior normal.      No results found for any visits on 10/05/18.     Assessment & Plan   1. Trochanteric bursitis of left hip - new problem - exam and history consistent with bursitis - will treat with NSAID, ice, home exercise program - if not improving in next month, consider corticosteroid injection  2. Poor dentition - needs extensive dental care and oral surgery - no signs of infection currently - significant tooth decay - advised on the efforts of oral health on physical health  Problem List Items Addressed This Visit      Digestive   Poor dentition     Nervous and Auditory   Alcoholic peripheral neuropathy (HCC)    Chronic and stable Continue Cymbalta, gabapentin, tramadol at current doses Working towards disability       Other Visit Diagnoses    Trochanteric bursitis of left hip    -  Primary       Return if symptoms worsen or fail to improve.   The entirety of the information documented in the History of Present Illness, Review of Systems and Physical Exam were personally obtained by me. Portions of this information were initially documented by [facility], CMA and reviewed by me for thoroughness and accuracy.    , Dionne Bucy, MD MPH [facility]

## 2018-10-25 ENCOUNTER — Telehealth: Payer: Self-pay | Admitting: Family Medicine

## 2018-10-25 NOTE — Telephone Encounter (Signed)
We do not give refills on antibiotics just in case.

## 2018-10-25 NOTE — Telephone Encounter (Signed)
Patient advised.

## 2018-10-25 NOTE — Telephone Encounter (Signed)
°  Pt going out of town and needing a refill on the Amoxicillin   for her teeth.  Needing this incase she has an abscesss.  [place], [place] - [place] [phone] (Phone) [phone] (Fax)     Please advise.  Thanks, [employer]

## 2018-11-09 ENCOUNTER — Other Ambulatory Visit: Payer: Self-pay | Admitting: Family Medicine

## 2018-11-09 NOTE — Telephone Encounter (Signed)
Patient needs refill on B12 and Tramadol sent to [facility]

## 2018-11-10 MED ORDER — TRAMADOL HCL 50 MG PO TABS: 50.0000 mg | ORAL_TABLET | Freq: Four times a day (QID) | ORAL | 2 refills | Status: DC | PRN

## 2018-11-10 MED ORDER — CYANOCOBALAMIN 1000 MCG/ML IJ KIT: 1000.0000 ug | PACK | INTRAMUSCULAR | 5 refills | Status: DC

## 2018-11-14 ENCOUNTER — Other Ambulatory Visit: Payer: Self-pay | Admitting: Internal Medicine

## 2018-11-29 ENCOUNTER — Other Ambulatory Visit: Payer: Self-pay | Admitting: Internal Medicine

## 2018-11-29 ENCOUNTER — Other Ambulatory Visit: Payer: Self-pay | Admitting: Family Medicine

## 2018-11-29 DIAGNOSIS — Z1231 Encounter for screening mammogram for malignant neoplasm of breast: Secondary | ICD-10-CM

## 2018-11-30 ENCOUNTER — Other Ambulatory Visit: Payer: Self-pay | Admitting: Family Medicine

## 2018-11-30 NOTE — Telephone Encounter (Signed)
Pt needing a refill on:  gabapentin (NEURONTIN) 400 MG capsule  Please fill at:  [place], [place] - [place] [phone] (Phone) [phone] (Fax)   Thanks, [employer]

## 2018-12-01 MED ORDER — GABAPENTIN 400 MG PO CAPS: 800.0000 mg | ORAL_CAPSULE | Freq: Three times a day (TID) | ORAL | 5 refills | Status: DC

## 2018-12-07 ENCOUNTER — Other Ambulatory Visit: Payer: Self-pay

## 2018-12-07 ENCOUNTER — Encounter: Payer: Self-pay | Admitting: Family Medicine

## 2018-12-07 ENCOUNTER — Ambulatory Visit (INDEPENDENT_AMBULATORY_CARE_PROVIDER_SITE_OTHER): Payer: PPO | Admitting: Family Medicine

## 2018-12-07 VITALS — BP 128/81 | HR 89 | Temp 97.7°F | Wt 102.6 lb

## 2018-12-07 DIAGNOSIS — R6882 Decreased libido: Secondary | ICD-10-CM | POA: Diagnosis not present

## 2018-12-07 DIAGNOSIS — N952 Postmenopausal atrophic vaginitis: Secondary | ICD-10-CM | POA: Diagnosis not present

## 2018-12-07 DIAGNOSIS — E28319 Asymptomatic premature menopause: Secondary | ICD-10-CM | POA: Diagnosis not present

## 2018-12-07 MED ORDER — ESTRADIOL 0.1 MG/GM VA CREA: 1.0000 | TOPICAL_CREAM | VAGINAL | 12 refills | Status: DC

## 2018-12-07 MED ORDER — DULOXETINE HCL 40 MG PO CPEP: 40.0000 mg | ORAL_CAPSULE | Freq: Every day | ORAL | 1 refills | Status: DC

## 2018-12-07 NOTE — Progress Notes (Signed)
Patient: Kathy [name] Female    DOB: 1960/12/13   58 y.o.   MRN: [id number] Visit Date: 12/07/2018  Today's Provider: Lavon Paganini, MD   Chief Complaint  Patient presents with   Hip Pain   Subjective:    Hip Pain  There was no injury mechanism. The pain is present in the left hip. The pain is at a severity of 0/10. The patient is experiencing no pain. She reports no foreign bodies present. Nothing aggravates the symptoms. She has tried nothing for the symptoms.  Patient states that her hip pain has improved with conservative measures and HEP  She qualified for disability and is going to have extensive dental work  Decreased libido.  Went through menopause at age 44.  Endocrinology suggested that we discuss HRT.  She does continue to smoke.  She has never had HRT.  She is not currently sexually active, but plans to resume soon as she is dating.  Allergies  Allergen Reactions   Lyrica [Pregabalin] Other (See Comments)    Blurred vision      Current Outpatient Medications:    Cyanocobalamin 1000 MCG/ML KIT, Inject 1,000 mcg as directed every 14 (fourteen) days., Disp: 1 kit, Rfl: 5   cyclobenzaprine (FLEXERIL) 10 MG tablet, Take 1 tablet (10 mg total) by mouth 3 (three) times daily as needed for muscle spasms. Involving the legs and feet, Disp: 90 tablet, Rfl: 1   dicyclomine (BENTYL) 20 MG tablet, Take 1 tablet (20 mg total) by mouth every 6 (six) hours as needed for spasms., Disp: 120 tablet, Rfl: 5   DULoxetine 40 MG CPEP, Take 40 mg by mouth daily., Disp: 90 capsule, Rfl: 1   gabapentin (NEURONTIN) 400 MG capsule, Take 2 capsules (800 mg total) by mouth 3 (three) times daily., Disp: 180 capsule, Rfl: 5   omeprazole (PRILOSEC) 40 MG capsule, Take 1 capsule (40 mg total) by mouth 2 (two) times daily., Disp: 180 capsule, Rfl: 1   Syringe/Needle, Disp, (SYRINGE 3CC/25GX1") 25G X 1" 3 ML MISC, Use for b12 injections, Disp: 50 each, Rfl: 0   traMADol (ULTRAM)  50 MG tablet, Take 1 tablet (50 mg total) by mouth every 6 (six) hours as needed., Disp: 90 tablet, Rfl: 2  Review of Systems  Constitutional: Negative.   Respiratory: Negative.   Genitourinary: Negative.   Neurological: Negative.     Social History   Tobacco Use   Smoking status: Current Every Day Smoker    Packs/day: 0.75    Years: 40.00    Pack years: 30.00    Types: Cigarettes   Smokeless tobacco: Never Used   Tobacco comment: smokes one pack/week currently  Substance Use Topics   Alcohol use: No    Alcohol/week: 0.0 standard drinks    Comment: abstinent since october 2017     Objective:   BP 128/81 (BP Location: Left Arm, Patient Position: Sitting, Cuff Size: Normal)    Pulse 89    Temp 97.7 F (36.5 C) (Temporal)    Wt 102 lb 9.6 oz (46.5 kg)    SpO2 99%    BMI 19.39 kg/m  Vitals:   12/07/18 1333  BP: 128/81  Pulse: 89  Temp: 97.7 F (36.5 C)  TempSrc: Temporal  SpO2: 99%  Weight: 102 lb 9.6 oz (46.5 kg)  Body mass index is 19.39 kg/m.   Physical Exam Vitals signs reviewed.  Constitutional:      General: She is not in acute distress.  Appearance: Normal appearance. She is well-developed. She is not diaphoretic.  HENT:     Head: Normocephalic and atraumatic.  Eyes:     General: No scleral icterus.    Conjunctiva/sclera: Conjunctivae normal.  Neck:     Musculoskeletal: Neck supple.     Thyroid: No thyromegaly.  Cardiovascular:     Rate and Rhythm: Normal rate and regular rhythm.     Pulses: Normal pulses.     Heart sounds: Normal heart sounds. No murmur.  Pulmonary:     Effort: Pulmonary effort is normal. No respiratory distress.     Breath sounds: Normal breath sounds. No wheezing, rhonchi or rales.  Abdominal:     General: There is no distension.     Palpations: Abdomen is soft.     Tenderness: There is no abdominal tenderness.  Genitourinary:    Comments: Patient defers exam Musculoskeletal:     Right lower leg: No edema.     Left  lower leg: No edema.  Lymphadenopathy:     Cervical: No cervical adenopathy.  Skin:    General: Skin is warm and dry.     Capillary Refill: Capillary refill takes less than 2 seconds.     Findings: No rash.  Neurological:     Mental Status: She is alert and oriented to person, place, and time. Mental status is at baseline.  Psychiatric:        Mood and Affect: Mood normal.        Behavior: Behavior normal.      No results found for any visits on 12/07/18.     Assessment & Plan   1. Decreased libido 2. Early menopause 3. Atrophic vaginitis  We discussed WHI study findings in detail.  In the combined estrogen-progesterone arm breast cancer risk was increased by 1.26 (CI of 1.00 to 1.59), coronary heart disease 1.29 (CI 1.02-1.63), stroke risk 1.41 (1.07-1.85), and pulmonary embolism 2.13 (CI 1.39-3.25).  That being said the while statistically significant the actual number of cases attributable are relatively small at an addition 8 cases of breast cancer, 7 more coronary artery event, 8 more strokes, and 8 additional case of pulmonary embolism per 10,000 women.  Study was terminated because of the increased breast cancer risk, this was not seen in the progestin only arm of the study for women without an intact uterus.  In addition it is important to note that HRT also had positive or risk reducing effects, and all cause mortality between the HRT/non-HRT users is not statistically different.  Estrogen-progestin HRT decreased the relative risk of hip fracture 0.66 (CI 0.45-0.98), colorectal cancer 0.63 (0.43-0.92).  Current consensus is to limit dose to the lowest effective dose, and shortest treatment duration possible.  Breast cancer risk appeared to increase after 4 years of use.  Also important to note is that these risk refer to systemic HRT for the treatment of vasomotor symptoms, and do not apply to vaginal preperations with minimal systemic absorption and aimed at treating symptoms of  vulvovaginal atrophy.    We briefly touched on findings of WHIMS trial published in 2005 which looked at women 14 year of age or older, and whether HRT was protective against the development of dementia.  The study revealed that HRT actually increased the risk for the development of dementia but was limited by looking only at patients 3 years of age and older.  The subsequent KEEPS trial  In 2012 which looked at HRT in recently postmenopausal women did not show any  improvement in cognitive function for women on HRT.  However, there was also no significant cognitive declines seen in recently postmenopausal women receiving HRT as had previously been shown in the WHIMS trial.     Patient also understands that smoking contributes to increased risk of VTE with estrogen.  She is not ready to quit at this time.  She decides to go with topical vaginal estrogen rather than oral estrogen replacement.  We discussed that this does not carry the benefit of improving osteoporosis, however   Meds ordered this encounter  Medications   estradiol (ESTRACE VAGINAL) 0.1 MG/GM vaginal cream    Sig: Place 1 Applicatorful vaginally 3 (three) times a week.    Dispense:  42.5 g    Refill:  12   DULoxetine HCl 40 MG CPEP    Sig: Take 40 mg by mouth daily.    Dispense:  30 capsule    Refill:  1     Return in about 3 months (around 03/09/2019) for Welcome to Medicare.  Approximately 25 minutes was spent in discussion of which greater than 50% was consultation.     The entirety of the information documented in the History of Present Illness, Review of Systems and Physical Exam were personally obtained by me. Portions of this information were initially documented by [facility], CMA and reviewed by me for thoroughness and accuracy.    Fleeta Kunde, Dionne Bucy, MD MPH [facility]

## 2018-12-07 NOTE — Patient Instructions (Signed)
Atrophic Vaginitis  Atrophic vaginitis is a condition in which the tissues that line the vagina become dry and thin. This condition is most common in women who have stopped having regular menstrual periods (are in menopause). This usually starts when a woman is 45-57 years old. That is the time when a woman's estrogen levels begin to drop (decrease). Estrogen is a female hormone. It helps to keep the tissues of the vagina moist. It stimulates the vagina to produce a clear fluid that lubricates the vagina for sexual intercourse. This fluid also protects the vagina from infection. Lack of estrogen can cause the lining of the vagina to get thinner and dryer. The vagina may also shrink in size. It may become less elastic. Atrophic vaginitis tends to get worse over time as a woman's estrogen level drops. What are the causes? This condition is caused by the normal drop in estrogen that happens around the time of menopause. What increases the risk? Certain conditions or situations may lower a woman's estrogen level, leading to a higher risk for atrophic vaginitis. You are more likely to develop this condition if:  You are taking medicines that block estrogen.  You have had your ovaries removed.  You are being treated for cancer with X-ray (radiation) or medicines (chemotherapy).  You have given birth or are breastfeeding.  You are older than age 50.  You smoke. What are the signs or symptoms? Symptoms of this condition include:  Pain, soreness, or bleeding during sexual intercourse (dyspareunia).  Vaginal burning, irritation, or itching.  Pain or bleeding when a speculum is used in a vaginal exam (pelvic exam).  Having burning pain when passing urine.  Vaginal discharge that is brown or yellow. In some cases, there are no symptoms. How is this diagnosed? This condition is diagnosed by taking a medical history and doing a physical exam. This will include a pelvic exam that checks the  vaginal tissues. Though rare, you may also have other tests, including:  A urine test.  A test that checks the acid balance in your vagina (acid balance test). How is this treated? Treatment for this condition depends on how severe your symptoms are. Treatment may include:  Using an over-the-counter vaginal lubricant before sex.  Using a long-acting vaginal moisturizer.  Using low-dose vaginal estrogen for moderate to severe symptoms that do not respond to other treatments. Options include creams, tablets, and inserts (vaginal rings). Before you use a vaginal estrogen, tell your health care provider if you have a history of: ? Breast cancer. ? Endometrial cancer. ? Blood clots. If you are not sexually active and your symptoms are very mild, you may not need treatment. Follow these instructions at home: Medicines  Take over-the-counter and prescription medicines only as told by your health care provider. Do not use herbal or alternative medicines unless your health care provider says that you can.  Use over-the-counter creams, lubricants, or moisturizers for dryness only as directed by your health care provider. General instructions  If your atrophic vaginitis is caused by menopause, discuss all of your menopause symptoms and treatment options with your health care provider.  Do not douche.  Do not use products that can make your vagina dry. These include: ? Scented feminine sprays. ? Scented tampons. ? Scented soaps.  Vaginal intercourse can help to improve blood flow and elasticity of vaginal tissue. If it hurts to have sex, try using a lubricant or moisturizer just before having intercourse. Contact a health care provider if:    Your discharge looks different than normal.  Your vagina has an unusual smell.  You have new symptoms.  Your symptoms do not improve with treatment.  Your symptoms get worse. Summary  Atrophic vaginitis is a condition in which the tissues that  line the vagina become dry and thin. It is most common in women who have stopped having regular menstrual periods (are in menopause).  Treatment options include using vaginal lubricants and low-dose vaginal estrogen.  Contact a health care provider if your vagina has an unusual smell, or if your symptoms get worse or do not improve after treatment. This information is not intended to replace advice given to you by your health care provider. Make sure you discuss any questions you have with your health care provider. Document Released: 06/26/2014 Document Revised: 01/22/2017 Document Reviewed: 11/05/2016 Elsevier Patient Education  2020 [employer] Inc[employer]  

## 2019-02-13 ENCOUNTER — Telehealth: Payer: Self-pay

## 2019-02-13 NOTE — Telephone Encounter (Signed)
Please review, I do not see a recent encounter or message previously about this shot and I do not see this added on medication list. Kathy [name]

## 2019-02-13 NOTE — Telephone Encounter (Signed)
Copied from [id number]. Topic: Appointment Scheduling - Scheduling Inquiry for Clinic >> Feb 13, 2019  1:24 PM Celene Kras wrote: Reason for CRM: Pt called and is requesting to have more information regarding her prolia shot. Please advise.

## 2019-02-13 NOTE — Telephone Encounter (Signed)
Patient advised to call endocrinology and ask when her appointment is, and if she needs a new referral.

## 2019-02-28 ENCOUNTER — Ambulatory Visit
Admission: RE | Admit: 2019-02-28 | Discharge: 2019-02-28 | Disposition: A | Discharge status: Home / Self Care | Payer: PPO | Source: Ambulatory Visit | Attending: Family Medicine | Admitting: Family Medicine

## 2019-02-28 DIAGNOSIS — Z1231 Encounter for screening mammogram for malignant neoplasm of breast: Secondary | ICD-10-CM | POA: Diagnosis not present

## 2019-03-01 ENCOUNTER — Telehealth: Payer: Self-pay

## 2019-03-01 DIAGNOSIS — Z8781 Personal history of (healed) traumatic fracture: Secondary | ICD-10-CM | POA: Diagnosis not present

## 2019-03-01 DIAGNOSIS — M8000XA Age-related osteoporosis with current pathological fracture, unspecified site, initial encounter for fracture: Secondary | ICD-10-CM | POA: Diagnosis not present

## 2019-03-01 DIAGNOSIS — K9049 Malabsorption due to intolerance, not elsewhere classified: Secondary | ICD-10-CM | POA: Diagnosis not present

## 2019-03-01 DIAGNOSIS — Z7983 Long term (current) use of bisphosphonates: Secondary | ICD-10-CM | POA: Diagnosis not present

## 2019-03-01 NOTE — Telephone Encounter (Signed)
Patient advised as below. Patient viewed on mychart also.

## 2019-03-01 NOTE — Telephone Encounter (Signed)
-----   Message from Virginia Crews, MD sent at 02/28/2019  3:26 PM EST ----- Normal mammogram. Repeat in 1 yr

## 2019-03-22 ENCOUNTER — Encounter: Payer: Self-pay | Admitting: Family Medicine

## 2019-03-24 ENCOUNTER — Other Ambulatory Visit: Payer: Self-pay

## 2019-03-24 ENCOUNTER — Encounter: Payer: Self-pay | Admitting: Family Medicine

## 2019-03-24 ENCOUNTER — Ambulatory Visit (INDEPENDENT_AMBULATORY_CARE_PROVIDER_SITE_OTHER): Payer: PPO | Admitting: Family Medicine

## 2019-03-24 VITALS — BP 126/82 | HR 68 | Temp 97.3°F | Resp 16 | Ht 61.0 in | Wt 105.4 lb

## 2019-03-24 DIAGNOSIS — Z0000 Encounter for general adult medical examination without abnormal findings: Secondary | ICD-10-CM | POA: Diagnosis not present

## 2019-03-24 DIAGNOSIS — Z86010 Personal history of colonic polyps: Secondary | ICD-10-CM | POA: Diagnosis not present

## 2019-03-24 DIAGNOSIS — Z720 Tobacco use: Secondary | ICD-10-CM | POA: Diagnosis not present

## 2019-03-24 NOTE — Progress Notes (Signed)
Patient: Kathy [name], Female    DOB: 1960/10/25, 59 y.o.   MRN: [id number] Visit Date: 03/24/2019  Today's Provider: Lavon Paganini, MD   Chief Complaint  Patient presents with  . Medicare Wellness   Subjective:     Annual wellness visit, initial (welcome to Medicare) Kathy [name] is a 59 y.o. female. She feels well. She reports exercising 2-3 days a week (has joined the gym). She reports she is sleeping well.  10/14/2016 Pap/HPV-negative 02/28/2019 Mammogram-BI-RADS 1 01/30/2016 BMD-Osteoporosis 01/31/2013 Colonoscopy-normal -----------------------------------------------------------   Review of Systems  Constitutional: Negative.   HENT: Negative.   Eyes: Negative.   Respiratory: Negative.   Cardiovascular: Negative.   Gastrointestinal: Negative.   Endocrine: Negative.   Genitourinary: Negative.   Musculoskeletal: Positive for arthralgias.  Skin: Negative.   Allergic/Immunologic: Negative.   Neurological: Positive for numbness.  Hematological: Negative.   Psychiatric/Behavioral: Negative.     Social History   Socioeconomic History  . Marital status: Divorced    Spouse name: Not on file  . Number of children: Not on file  . Years of education: Not on file  . Highest education level: Not on file  Occupational History  . Not on file  Tobacco Use  . Smoking status: Current Every Day Smoker    Packs/day: 0.75    Years: 40.00    Pack years: 30.00    Types: Cigarettes  . Smokeless tobacco: Never Used  . Tobacco comment: smokes one pack/week currently  Substance and Sexual Activity  . Alcohol use: No    Alcohol/week: 0.0 standard drinks    Comment: abstinent since october 2017  . Drug use: No  . Sexual activity: Not Currently  Other Topics Concern  . Not on file  Social History Narrative  . Not on file   Social Determinants of Health   Financial Resource Strain:   . Difficulty of Paying Living Expenses: Not on file  Food  Insecurity:   . Worried About Charity fundraiser in the Last Year: Not on file  . Ran Out of Food in the Last Year: Not on file  Transportation Needs:   . Lack of Transportation (Medical): Not on file  . Lack of Transportation (Non-Medical): Not on file  Physical Activity:   . Days of Exercise per Week: Not on file  . Minutes of Exercise per Session: Not on file  Stress:   . Feeling of Stress : Not on file  Social Connections:   . Frequency of Communication with Friends and Family: Not on file  . Frequency of Social Gatherings with Friends and Family: Not on file  . Attends Religious Services: Not on file  . Active Member of Clubs or Organizations: Not on file  . Attends Archivist Meetings: Not on file  . Marital Status: Not on file  Intimate Partner Violence:   . Fear of Current or Ex-Partner: Not on file  . Emotionally Abused: Not on file  . Physically Abused: Not on file  . Sexually Abused: Not on file    Past Medical History:  Diagnosis Date  . Alcohol abuse   . Chicken pox   . Colon polyps   . Depression   . Hernia of abdominal cavity   . Hip fracture ([place]) right  . Neuropathy      Patient Active Problem List   Diagnosis Date Noted  . GAD (generalized anxiety disorder) 09/19/2018  . Poor dentition 05/18/2018  . Tobacco abuse  02/22/2018  . 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 Diverticulosis of colon without diverticulitis 02/22/2018  . MDD (major depressive disorder) 09/13/2017  . Osteoporosis 10/17/2016  . Prediabetes 10/17/2016  . Pernicious anemia 01/01/2016  . 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 Hammertoe 07/31/2015  . Alcoholic peripheral neuropathy ([place]) 04/23/2015  . Fatty liver, alcoholic 13/09/6576  . History of alcohol dependence ([place]) 07/13/2014    No past surgical history on file.  Her family history includes Alcohol abuse in an other family member; Cancer (age of onset: 15) in her father; Colon cancer in her  paternal grandmother; Hypertension in her mother; Meniere's disease in her mother; Osteoporosis in her mother.   Current Outpatient Medications:  .  Cyanocobalamin 1000 MCG/ML KIT, Inject 1,000 mcg as directed every 14 (fourteen) days., Disp: 1 kit, Rfl: 5 .  cyclobenzaprine (FLEXERIL) 10 MG tablet, Take 1 tablet (10 mg total) by mouth 3 (three) times daily as needed for muscle spasms. Involving the legs and feet, Disp: 90 tablet, Rfl: 1 .  dicyclomine (BENTYL) 20 MG tablet, Take 1 tablet (20 mg total) by mouth every 6 (six) hours as needed for spasms., Disp: 120 tablet, Rfl: 5 .  DULoxetine HCl 40 MG CPEP, Take 40 mg by mouth daily., Disp: 30 capsule, Rfl: 1 .  estradiol (ESTRACE VAGINAL) 0.1 MG/GM vaginal cream, Place 1 Applicatorful vaginally 3 (three) times a week., Disp: 42.5 g, Rfl: 12 .  gabapentin (NEURONTIN) 400 MG capsule, Take 2 capsules (800 mg total) by mouth 3 (three) times daily., Disp: 180 capsule, Rfl: 5 .  omeprazole (PRILOSEC) 40 MG capsule, Take 1 capsule (40 mg total) by mouth 2 (two) times daily., Disp: 180 capsule, Rfl: 1 .  Syringe/Needle, Disp, (SYRINGE 3CC/25GX1") 25G X 1" 3 ML MISC, Use for b12 injections, Disp: 50 each, Rfl: 0 .  traMADol (ULTRAM) 50 MG tablet, Take 1 tablet (50 mg total) by mouth every 6 (six) hours as needed., Disp: 90 tablet, Rfl: 2  Patient Care Team: Virginia Crews, MD as PCP - General (Family Medicine)    Objective:    Vitals: BP 126/82 (BP Location: Left Arm, Patient Position: Sitting, Cuff Size: Normal)   Pulse 68   Temp (!) 97.3 F (36.3 C) (Temporal)   Resp 16   Ht '5\' 1"'  (1.549 m)   Wt 105 lb 6.4 oz (47.8 kg)   BMI 19.92 kg/m   Physical Exam Vitals reviewed.  Constitutional:      General: She is not in acute distress.    Appearance: Normal appearance. She is well-developed. She is not diaphoretic.  HENT:     Head: Normocephalic and atraumatic.     Right Ear: Tympanic membrane, ear canal and external ear normal.     Left  Ear: Tympanic membrane, ear canal and external ear normal.     Nose: Nose normal. No congestion.     Mouth/Throat:     Mouth: Mucous membranes are moist.     Pharynx: Oropharynx is clear. No oropharyngeal exudate.  Eyes:     General: No scleral icterus.    Conjunctiva/sclera: Conjunctivae normal.     Pupils: Pupils are equal, round, and reactive to light.  Neck:     Thyroid: No thyromegaly.  Cardiovascular:     Rate and Rhythm: Normal rate and regular rhythm.     Heart sounds: Normal heart sounds. No murmur.  Pulmonary:     Effort: Pulmonary effort is normal. No respiratory distress.  Breath sounds: Normal breath sounds. No wheezing or rales.  Abdominal:     General: There is no distension.     Palpations: Abdomen is soft.     Tenderness: There is no abdominal tenderness. There is no guarding or rebound.  Musculoskeletal:        General: No deformity.     Cervical back: Neck supple.     Right lower leg: No edema.     Left lower leg: No edema.  Lymphadenopathy:     Cervical: No cervical adenopathy.  Skin:    General: Skin is warm and dry.     Capillary Refill: Capillary refill takes less than 2 seconds.     Findings: No rash.  Neurological:     Mental Status: She is alert and oriented to person, place, and time. Mental status is at baseline.  Psychiatric:        Mood and Affect: Mood normal.        Behavior: Behavior normal.        Thought Content: Thought content normal.     Activities of Daily Living In your present state of health, do you have any difficulty performing the following activities: 03/24/2019 05/16/2018  Hearing? N N  Vision? N N  Difficulty concentrating or making decisions? N N  Walking or climbing stairs? Y Y  Dressing or bathing? N N  Doing errands, shopping? N N  Some recent data might be hidden    Fall Risk Assessment Fall Risk  03/24/2019 05/16/2018  Falls in the past year? 0 0  Number falls in past yr: 0 -  Injury with Fall? 0 -  Risk for  fall due to : No Fall Risks -  Follow up Falls evaluation completed -     Depression Screen PHQ 2/9 Scores 03/24/2019 09/19/2018 05/16/2018 09/13/2017  PHQ - 2 Score '1 3 1 3  ' PHQ- 9 Score '1 5 3 8    ' No flowsheet data found.     Initial EKG appeared to have no voltage in lead II and very low voltage in aVF.  Repeat EKG is normal sinus rhythm with no signs of ischemia.  Suspect that this was a lead issue  Assessment & Plan:     Annual Wellness Visit  Reviewed patient's Family Medical History Reviewed and updated list of patient's medical providers Assessment of cognitive impairment was done Assessed patient's functional ability Established a written schedule for health screening [place] Completed and Reviewed  Exercise Activities and Dietary recommendations Goals   None     Immunization History  Administered Date(s) Administered  . Hep A / Hep B 10/11/2014, 11/14/2014, 04/22/2015  . PPD Test 09/16/2015    Health Maintenance  Topic Date Due  . TETANUS/TDAP  12/25/1980  . COLONOSCOPY  01/31/2018  . INFLUENZA VACCINE  05/24/2019 (Originally 09/24/2018)  . PAP SMEAR-Modifier  10/15/2019  . MAMMOGRAM  02/27/2021  . Hepatitis C Screening  Completed  . HIV Screening  Completed     Discussed health benefits of physical activity, and encouraged her to engage in regular exercise appropriate for her age and condition.    ------------------------------------------------------------------------------------------------------------  Problem List Items Addressed This Visit      Other   Tobacco abuse    Again encourage cessation and discussed its importance She is contemplating quitting and thinks that her daughters are a good help in this regard She is trying to cut back slowly Not interested in prescription therapy to help with cessation  at this time Continue to reassess       Other Visit Diagnoses    Welcome to Medicare preventive visit    -   Primary   Relevant Orders   EKG 12-Lead (Completed)   EKG 12-Lead (Completed)   History of colon polyps       Relevant Orders   Ambulatory referral to Gastroenterology       Return in about 3 months (around 06/22/2019) for chronic disease f/u.   The entirety of the information documented in the History of Present Illness, Review of Systems and Physical Exam were personally obtained by me. Portions of this information were initially documented by Lynford Humphrey, CMA and reviewed by me for thoroughness and accuracy.    Lekeya Rollings, Dionne Bucy, MD MPH [facility]

## 2019-03-24 NOTE — Assessment & Plan Note (Signed)
Again encourage cessation and discussed its importance She is contemplating quitting and thinks that her daughters are a good help in this regard She is trying to cut back slowly Not interested in prescription therapy to help with cessation at this time Continue to reassess

## 2019-03-24 NOTE — Patient Instructions (Addendum)
The CDC recommends two doses of Shingrix (the shingles vaccine) separated by 2 to 6 months for adults age 58 years and older. I recommend checking with your insurance plan regarding coverage for this vaccine.  Also ask about TDAP (tetanus vaccine)  Someone will call to set up an appointment with GI to discuss colonoscopy and possible EGD     Preventive Care 56-52 Years Old, Female Preventive care refers to visits with your health care provider and lifestyle choices that can promote health and wellness. This includes:  A yearly physical exam. This may also be called an annual well check.  Regular dental visits and eye exams.  Immunizations.  Screening for certain conditions.  Healthy lifestyle choices, such as eating a healthy diet, getting regular exercise, not using drugs or products that contain nicotine and tobacco, and limiting alcohol use. What can I expect for my preventive care visit? Physical exam Your health care provider will check your:  Height and weight. This may be used to calculate body mass index (BMI), which tells if you are at a healthy weight.  Heart rate and blood pressure.  Skin for abnormal spots. Counseling Your health care provider may ask you questions about your:  Alcohol, tobacco, and drug use.  Emotional well-being.  Home and relationship well-being.  Sexual activity.  Eating habits.  Work and work Statistician.  Method of birth control.  Menstrual cycle.  Pregnancy history. What immunizations do I need?  Influenza (flu) vaccine  This is recommended every year. Tetanus, diphtheria, and pertussis (Tdap) vaccine  You may need a Td booster every 10 years. Varicella (chickenpox) vaccine  You may need this if you have not been vaccinated. Zoster (shingles) vaccine  You may need this after age 45. Measles, mumps, and rubella (MMR) vaccine  You may need at least one dose of MMR if you were born in 1957 or later. You may also need a  second dose. Pneumococcal conjugate (PCV13) vaccine  You may need this if you have certain conditions and were not previously vaccinated. Pneumococcal polysaccharide (PPSV23) vaccine  You may need one or two doses if you smoke cigarettes or if you have certain conditions. Meningococcal conjugate (MenACWY) vaccine  You may need this if you have certain conditions. Hepatitis A vaccine  You may need this if you have certain conditions or if you travel or work in places where you may be exposed to hepatitis A. Hepatitis B vaccine  You may need this if you have certain conditions or if you travel or work in places where you may be exposed to hepatitis B. Haemophilus influenzae type b (Hib) vaccine  You may need this if you have certain conditions. Human papillomavirus (HPV) vaccine  If recommended by your health care provider, you may need three doses over 6 months. You may receive vaccines as individual doses or as more than one vaccine together in one shot (combination vaccines). Talk with your health care provider about the risks and benefits of combination vaccines. What tests do I need? Blood tests  Lipid and cholesterol levels. These may be checked every 5 years, or more frequently if you are over 57 years old.  Hepatitis C test.  Hepatitis B test. Screening  Lung cancer screening. You may have this screening every year starting at age 5 if you have a 30-pack-year history of smoking and currently smoke or have quit within the past 15 years.  Colorectal cancer screening. All adults should have this screening starting at age  50 and continuing until age 14. Your health care provider may recommend screening at age 57 if you are at increased risk. You will have tests every 1-10 years, depending on your results and the type of screening test.  Diabetes screening. This is done by checking your blood sugar (glucose) after you have not eaten for a while (fasting). You may have this done  every 1-3 years.  Mammogram. This may be done every 1-2 years. Talk with your health care provider about when you should start having regular mammograms. This may depend on whether you have a family history of breast cancer.  BRCA-related cancer screening. This may be done if you have a family history of breast, ovarian, tubal, or peritoneal cancers.  Pelvic exam and Pap test. This may be done every 3 years starting at age 30. Starting at age 95, this may be done every 5 years if you have a Pap test in combination with an HPV test. Other tests  Sexually transmitted disease (STD) testing.  Bone density scan. This is done to screen for osteoporosis. You may have this scan if you are at high risk for osteoporosis. Follow these instructions at home: Eating and drinking  Eat a diet that includes fresh fruits and vegetables, whole grains, lean protein, and low-fat dairy.  Take vitamin and mineral supplements as recommended by your health care provider.  Do not drink alcohol if: ? Your health care provider tells you not to drink. ? You are pregnant, may be pregnant, or are planning to become pregnant.  If you drink alcohol: ? Limit how much you have to 0-1 drink a day. ? Be aware of how much alcohol is in your drink. In the [place] one drink equals one 12 oz bottle of beer (355 mL), one 5 oz glass of wine (148 mL), or one 1 oz glass of hard liquor (44 mL). Lifestyle  Take daily care of your teeth and gums.  Stay active. Exercise for at least 30 minutes on 5 or more days each week.  Do not use any products that contain nicotine or tobacco, such as cigarettes, e-cigarettes, and chewing tobacco. If you need help quitting, ask your health care provider.  If you are sexually active, practice safe sex. Use a condom or other form of birth control (contraception) in order to prevent pregnancy and STIs (sexually transmitted infections).  If told by your health care provider, take low-dose aspirin  daily starting at age 30. What's next?  Visit your health care provider once a year for a well check visit.  Ask your health care provider how often you should have your eyes and teeth checked.  Stay up to date on all vaccines. This information is not intended to replace advice given to you by your health care provider. Make sure you discuss any questions you have with your health care provider. Document Revised: 10/21/2017 Document Reviewed: 10/21/2017 Elsevier Patient Education  2020 [employer].

## 2019-03-28 NOTE — Addendum Note (Signed)
Addended by: Virginia Crews on: 03/28/2019 08:13 AM   Modules accepted: Level of Service

## 2019-04-04 ENCOUNTER — Other Ambulatory Visit: Payer: Self-pay | Admitting: Family Medicine

## 2019-04-04 DIAGNOSIS — M810 Age-related osteoporosis without current pathological fracture: Secondary | ICD-10-CM | POA: Diagnosis not present

## 2019-04-04 NOTE — Telephone Encounter (Signed)
Requested medication (s) are due for refill today: yes  Requested medication (s) are on the active medication list: yes  Last refill: 11/10/2018  #90 2 refills  Future visit scheduled Yes 06/12/2019  Notes to clinic:not delegated  Requested Prescriptions  Pending Prescriptions Disp Refills   traMADol (ULTRAM) 50 MG tablet [Pharmacy Med Name: TRAMADOL HCL 50 MG TAB] 90 tablet     Sig: TAKE ONE TABLET BY MOUTH EVERY 6 HOURS AS NEEDED      Not Delegated - Analgesics:  Opioid Agonists Failed - 04/04/2019  6:13 PM      Failed - This refill cannot be delegated      Failed - Urine Drug Screen completed in last 360 days.      Passed - Valid encounter within last 6 months    Recent Outpatient Visits           1 week ago Welcome to [employer] preventive visit   [facility], Dionne Bucy, MD   3 months ago Decreased libido   [facility], Dionne Bucy, MD   6 months ago Trochanteric bursitis of left hip   [facility], Dionne Bucy, MD   6 months ago [place] Bacigalupo, Dionne Bucy, MD   10 months ago Alcoholic peripheral neuropathy [facility])   [facility], Dionne Bucy, MD       Future Appointments             In 2 months Bacigalupo, Dionne Bucy, MD [facility], [place]

## 2019-04-05 NOTE — Telephone Encounter (Signed)
Needs to no longer be getting it from Dr Florentina Addison in [place]

## 2019-04-06 ENCOUNTER — Other Ambulatory Visit: Payer: Self-pay

## 2019-04-06 DIAGNOSIS — M810 Age-related osteoporosis without current pathological fracture: Secondary | ICD-10-CM

## 2019-04-06 NOTE — Progress Notes (Signed)
We received orders from [facility] Endocrinology Dr. Otis Peak, Barbette Reichmann for patient to have labs drawn here in [place]. Left detailed message for patient to come in fasting for labs.

## 2019-04-17 ENCOUNTER — Telehealth: Payer: Self-pay

## 2019-04-17 NOTE — Telephone Encounter (Signed)
Patient wants to know if you can add "no changes since last exam", and that she is still not able to work more than 10 hours.

## 2019-04-17 NOTE — Telephone Encounter (Signed)
Copied from [place] [phone]. Topic: General - Other >> Apr 17, 2019 11:47 AM Kathy [name] wrote: Patient requests that Dr. Brita Romp write a letter about her current health concerns. She states this will be the 3rd letter and that Dr. B is familiar with the situation.

## 2019-04-17 NOTE — Telephone Encounter (Signed)
Can we just give a copy of the last letter?  I don't think there is really anything to update.

## 2019-04-17 NOTE — Telephone Encounter (Signed)
Please advise 

## 2019-04-18 NOTE — Telephone Encounter (Signed)
I sent the letter to her through my chart.  I did leave it as no more than 4 hours/day for 5 days a week.  If she is working less than that currently, that is fine.  That is between her and her supervisor.  The restriction is still for no more than 4 hours/day.

## 2019-04-18 NOTE — Telephone Encounter (Signed)
Previous letters have stated no more than 4 hours per day 5 days per week.  Is that still correct?  What is the 10 hr comment?

## 2019-04-18 NOTE — Telephone Encounter (Signed)
Patient was advised and states that the letter is ok.

## 2019-04-18 NOTE — Telephone Encounter (Signed)
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 Patient said 'Thank you so much'.

## 2019-04-20 DIAGNOSIS — D2262 Melanocytic nevi of left upper limb, including shoulder: Secondary | ICD-10-CM | POA: Diagnosis not present

## 2019-04-20 DIAGNOSIS — D2272 Melanocytic nevi of left lower limb, including hip: Secondary | ICD-10-CM | POA: Diagnosis not present

## 2019-04-20 DIAGNOSIS — D2271 Melanocytic nevi of right lower limb, including hip: Secondary | ICD-10-CM | POA: Diagnosis not present

## 2019-04-20 DIAGNOSIS — L821 Other seborrheic keratosis: Secondary | ICD-10-CM | POA: Diagnosis not present

## 2019-04-20 DIAGNOSIS — D2261 Melanocytic nevi of right upper limb, including shoulder: Secondary | ICD-10-CM | POA: Diagnosis not present

## 2019-04-20 DIAGNOSIS — D485 Neoplasm of uncertain behavior of skin: Secondary | ICD-10-CM | POA: Diagnosis not present

## 2019-04-20 DIAGNOSIS — D225 Melanocytic nevi of trunk: Secondary | ICD-10-CM | POA: Diagnosis not present

## 2019-04-27 ENCOUNTER — Ambulatory Visit: Payer: PPO | Admitting: Gastroenterology

## 2019-05-18 ENCOUNTER — Other Ambulatory Visit: Payer: Self-pay | Admitting: Family Medicine

## 2019-05-18 MED ORDER — CYANOCOBALAMIN 1000 MCG/ML IJ KIT: 1000.0000 ug | PACK | INTRAMUSCULAR | 3 refills | Status: DC

## 2019-05-18 MED ORDER — OMEPRAZOLE 40 MG PO CPDR: 40.0000 mg | DELAYED_RELEASE_CAPSULE | Freq: Two times a day (BID) | ORAL | 1 refills | Status: DC

## 2019-05-18 NOTE — Telephone Encounter (Signed)
Medication: omeprazole (PRILOSEC) 40 MG capsule [id number] Vitamin B12 injections.                        Pharmacy:  [place], [place] Phone:  [phone]  Fax:  [phone]

## 2019-05-18 NOTE — Telephone Encounter (Signed)
Requested medication (s) are due for refill today: omeprazole, yes   Requested medication (s) are on the active medication list: yes  Last refill:  09/13/17  Future visit scheduled: yes  Notes to clinic:  script per Dr Deborra Medina  Requested medication (s) are due for refill today: cyanocobalamin, yes  Requested medication (s) are on the active medication list: yes  Last refill:  11/10/2018  Future visit scheduled: yes  Notes to clinic:  no assigned protocol  Requested Prescriptions  Pending Prescriptions Disp Refills   omeprazole (PRILOSEC) 40 MG capsule 180 capsule 1    Sig: Take 1 capsule (40 mg total) by mouth 2 (two) times daily.      Gastroenterology: Proton Pump Inhibitors Passed - 05/18/2019  2:19 PM      Passed - Valid encounter within last 12 months    Recent Outpatient Visits           1 month ago Welcome to [employer] preventive visit   [facility], Dionne Bucy, MD   5 months ago Decreased libido   [facility], Dionne Bucy, MD   7 months ago Trochanteric bursitis of left hip   [facility], Dionne Bucy, MD   8 months ago [place] Bacigalupo, Dionne Bucy, MD   1 year ago Alcoholic peripheral neuropathy [facility])   [facility], Dionne Bucy, MD       Future Appointments             In 1 month Bacigalupo, Dionne Bucy, MD [facility]              Cyanocobalamin 1000 MCG/ML KIT 1 kit 5    Sig: Inject 1,000 mcg as directed every 14 (fourteen) days.      Off-Protocol Failed - 05/18/2019  2:19 PM      Failed - Medication not assigned to a protocol, review manually.      Passed - Valid encounter within last 12 months    Recent Outpatient Visits           1 month ago Welcome to [employer] preventive visit   [facility], Dionne Bucy, MD   5 months ago Decreased libido   [facility], Dionne Bucy, MD   7 months ago Trochanteric bursitis of left hip   [facility], Dionne Bucy, MD   8 months ago [place] Bacigalupo, Dionne Bucy, MD   1 year ago Alcoholic peripheral neuropathy [facility])   [facility], Dionne Bucy, MD       Future Appointments             In 1 month Bacigalupo, Dionne Bucy, MD [facility], [place]

## 2019-06-07 ENCOUNTER — Telehealth: Payer: Self-pay | Admitting: Internal Medicine

## 2019-06-07 NOTE — Telephone Encounter (Signed)
Pt called to say that she is now on Medicare and that she would like to come back and re-est with Dr.Tullo since she is in network  She said that she is do for a prolia injection in June and would like to schedule that

## 2019-06-07 NOTE — Telephone Encounter (Signed)
Can you call patient to scheduled a new pt appt.

## 2019-06-07 NOTE — Telephone Encounter (Signed)
Ok to set up.  Juliann Pulse will need to check on the authorizati onfor Prolia

## 2019-06-08 NOTE — Telephone Encounter (Signed)
Pt scheduled for 06/21/19 @ 2pm.

## 2019-06-08 NOTE — Telephone Encounter (Signed)
Lm on vm to call office to make an appointment to re-establish. Patient was las seen with [place] 01/2018. Her insurance now covers this office.

## 2019-06-15 ENCOUNTER — Ambulatory Visit: Payer: PPO | Admitting: Gastroenterology

## 2019-06-19 ENCOUNTER — Other Ambulatory Visit: Payer: Self-pay

## 2019-06-20 NOTE — Telephone Encounter (Signed)
Prolia has been submitted have not received approval but notify while patient in office and I can recheck.

## 2019-06-21 ENCOUNTER — Other Ambulatory Visit: Payer: Self-pay

## 2019-06-21 ENCOUNTER — Ambulatory Visit (INDEPENDENT_AMBULATORY_CARE_PROVIDER_SITE_OTHER): Payer: PPO | Admitting: Internal Medicine

## 2019-06-21 ENCOUNTER — Encounter: Payer: Self-pay | Admitting: Internal Medicine

## 2019-06-21 VITALS — BP 122/74 | HR 77 | Temp 98.2°F | Ht 61.0 in | Wt 101.2 lb

## 2019-06-21 DIAGNOSIS — K089 Disorder of teeth and supporting structures, unspecified: Secondary | ICD-10-CM

## 2019-06-21 DIAGNOSIS — D510 Vitamin B12 deficiency anemia due to intrinsic factor deficiency: Secondary | ICD-10-CM

## 2019-06-21 DIAGNOSIS — K700 Alcoholic fatty liver: Secondary | ICD-10-CM | POA: Diagnosis not present

## 2019-06-21 DIAGNOSIS — E559 Vitamin D deficiency, unspecified: Secondary | ICD-10-CM | POA: Diagnosis not present

## 2019-06-21 DIAGNOSIS — G621 Alcoholic polyneuropathy: Secondary | ICD-10-CM | POA: Diagnosis not present

## 2019-06-21 DIAGNOSIS — Z720 Tobacco use: Secondary | ICD-10-CM | POA: Diagnosis not present

## 2019-06-21 DIAGNOSIS — F411 Generalized anxiety disorder: Secondary | ICD-10-CM

## 2019-06-21 DIAGNOSIS — M810 Age-related osteoporosis without current pathological fracture: Secondary | ICD-10-CM

## 2019-06-21 MED ORDER — TRAMADOL HCL 50 MG PO TABS: 50.0000 mg | ORAL_TABLET | Freq: Three times a day (TID) | ORAL | 4 refills | Status: AC | PRN

## 2019-06-21 MED ORDER — CYANOCOBALAMIN 1000 MCG/ML IJ SOLN: 1000.0000 ug | INTRAMUSCULAR | 3 refills | Status: DC

## 2019-06-21 MED ORDER — "SYRINGE 25G X 1"" 3 ML MISC": 0 refills | Status: DC

## 2019-06-21 MED ORDER — BUSPIRONE HCL 5 MG PO TABS: 5.0000 mg | ORAL_TABLET | Freq: Three times a day (TID) | ORAL | 1 refills | Status: DC

## 2019-06-21 NOTE — Patient Instructions (Signed)
I would avoid using tranxene because it is in a class of drugs that has the same effect on the brain as alcohol does,  So it is addicting   Buspirone trial recommended.  We are starting with the lowest dose ,  And yo ucan take it three times daily.  We can increase the dose as needed   Tramadol refill available at pharmacy on May 9  Gi referral in process  Prolia authorization in process  Buspirone tablets What is this medicine? BUSPIRONE (byoo SPYE rone) is used to treat anxiety disorders. This medicine may be used for other purposes; ask your health care provider or pharmacist if you have questions. COMMON BRAND NAME(S): BuSpar What should I tell my health care provider before I take this medicine? They need to know if you have any of these conditions:  kidney or liver disease  an unusual or allergic reaction to buspirone, other medicines, foods, dyes, or preservatives  pregnant or trying to get pregnant  breast-feeding How should I use this medicine? Take this medicine by mouth with a glass of water. Follow the directions on the prescription label. You may take this medicine with or without food. To ensure that this medicine always works the same way for you, you should take it either always with or always without food. Take your doses at regular intervals. Do not take your medicine more often than directed. Do not stop taking except on the advice of your doctor or health care professional. Talk to your pediatrician regarding the use of this medicine in children. Special care may be needed. Overdosage: If you think you have taken too much of this medicine contact a poison control center or emergency room at once. NOTE: This medicine is only for you. Do not share this medicine with others. What if I miss a dose? If you miss a dose, take it as soon as you can. If it is almost time for your next dose, take only that dose. Do not take double or extra doses. What may interact with this  medicine? Do not take this medicine with any of the following medications:  linezolid  MAOIs like Carbex, Eldepryl, Marplan, Nardil, and Parnate  methylene blue  procarbazine This medicine may also interact with the following medications:  diazepam  digoxin  diltiazem  erythromycin  grapefruit juice  haloperidol  medicines for mental depression or mood problems  medicines for seizures like carbamazepine, phenobarbital and phenytoin  nefazodone  other medications for anxiety  rifampin  ritonavir  some antifungal medicines like itraconazole, ketoconazole, and voriconazole  verapamil  warfarin This list may not describe all possible interactions. Give your health care provider a list of all the medicines, herbs, non-prescription drugs, or dietary supplements you use. Also tell them if you smoke, drink alcohol, or use illegal drugs. Some items may interact with your medicine. What should I watch for while using this medicine? Visit your doctor or health care professional for regular checks on your progress. It may take 1 to 2 weeks before your anxiety gets better. You may get drowsy or dizzy. Do not drive, use machinery, or do anything that needs mental alertness until you know how this drug affects you. Do not stand or sit up quickly, especially if you are an older patient. This reduces the risk of dizzy or fainting spells. Alcohol can make you more drowsy and dizzy. Avoid alcoholic drinks. What side effects may I notice from receiving this medicine? Side effects that you should  report to your doctor or health care professional as soon as possible:  blurred vision or other vision changes  chest pain  confusion  difficulty breathing  feelings of hostility or anger  muscle aches and pains  numbness or tingling in hands or feet  ringing in the ears  skin rash and itching  vomiting  weakness Side effects that usually do not require medical attention  (report to your doctor or health care professional if they continue or are bothersome):  disturbed dreams, nightmares  headache  nausea  restlessness or nervousness  sore throat and nasal congestion  stomach upset This list may not describe all possible side effects. Call your doctor for medical advice about side effects. You may report side effects to FDA at 1-800-FDA-1088. Where should I keep my medicine? Keep out of the reach of children. Store at room temperature below 30 degrees C (86 degrees F). Protect from light. Keep container tightly closed. Throw away any unused medicine after the expiration date. NOTE: This sheet is a summary. It may not cover all possible information. If you have questions about this medicine, talk to your doctor, pharmacist, or health care provider.  2020 Elsevier/Gold Standard (2009-09-19 18:06:11)

## 2019-06-21 NOTE — Progress Notes (Signed)
Subjective:  Patient ID: Kathy [name], female    DOB: 1961-02-11  Age: 59 y.o. MRN: [id number]  CC: The primary encounter diagnosis was Pernicious anemia. Diagnoses of GAD (generalized anxiety disorder), Vitamin D deficiency, Alcoholic peripheral neuropathy ([place]), Fatty liver, alcoholic, Osteoporosis without current pathological fracture, unspecified osteoporosis type, Poor dentition, and Tobacco abuse were also pertinent to this visit.  HPI Kathy [name] presents for follow up on chronic pain secondary to neuropathy, history of alcohol abuse , and osteoporosis   This visit occurred during the SARS-CoV-2 public health emergency.  Safety protocols were in place, including screening questions prior to the visit, additional usage of staff PPE, and extensive cleaning of exam room while observing appropriate contact time as indicated for disinfecting solutions.    Patient has received NO  doses of the available COVID 19 vaccines.   Patient continues to mask when outside of the home except when walking in yard or at safe distances from others .  Patient denies any change in mood or development of unhealthy behaviors resuting from the pandemic's restriction of activities and socialization.     Cc: 1) anxiety.  Managed with  cymbalta.  Wakes up with heart racing most nights.  Mother has GAD and takes tranxene so she tried it.  Discussed the risks of benzodiazepine use in alcoholism.   2) Osteoporosis : receiving Prolia . Last dose was Mar 01 2019 at [facility] unsure how much D she is taking.  History of  fractures   3) Needs letter updated to disability regarding her and include the anxiety.    4) HM:  Due for colonoscopy , needs referral to female provider at [place] Medications Prior to Visit  Medication Sig Dispense Refill  . cyclobenzaprine (FLEXERIL) 10 MG tablet Take 1 tablet (10 mg total) by mouth 3 (three) times daily as needed for muscle spasms. Involving the legs and  feet 90 tablet 1  . dicyclomine (BENTYL) 20 MG tablet Take 1 tablet (20 mg total) by mouth every 6 (six) hours as needed for spasms. 120 tablet 5  . DULoxetine HCl 40 MG CPEP Take 40 mg by mouth daily. 30 capsule 1  . gabapentin (NEURONTIN) 400 MG capsule Take 2 capsules (800 mg total) by mouth 3 (three) times daily. 180 capsule 5  . omeprazole (PRILOSEC) 40 MG capsule Take 1 capsule (40 mg total) by mouth 2 (two) times daily. 180 capsule 1  . traMADol (ULTRAM) 50 MG tablet TAKE ONE TABLET BY MOUTH EVERY 6 HOURS AS NEEDED 90 tablet 2  . Cyanocobalamin 1000 MCG/ML KIT Inject 1,000 mcg as directed every 14 (fourteen) days. 1 kit 3  . Syringe/Needle, Disp, (SYRINGE 3CC/25GX1") 25G X 1" 3 ML MISC Use for b12 injections 50 each 0  . estradiol (ESTRACE VAGINAL) 0.1 MG/GM vaginal cream Place 1 Applicatorful vaginally 3 (three) times a week. (Patient not taking: Reported on 06/21/2019) 42.5 g 12   No facility-administered medications prior to visit.    Review of Systems;  Patient denies headache, fevers, malaise, unintentional weight loss, skin rash, eye pain, sinus congestion and sinus pain, sore throat, dysphagia,  hemoptysis , cough, dyspnea, wheezing, chest pain, palpitations, orthopnea, edema, abdominal pain, nausea, melena, diarrhea, constipation, flank pain, dysuria, hematuria, urinary  Frequency, nocturia, numbness, tingling, seizures,  Focal weakness, Loss of consciousness,  Tremor, insomnia, depression, anxiety, and suicidal ideation.      Objective:  BP 122/74 (BP Location: Left Arm, Patient Position: Sitting, Cuff Size: Small)  Pulse 77   Temp 98.2 F (36.8 C) (Skin)   Ht '5\' 1"'  (1.549 m)   Wt 101 lb 3.2 oz (45.9 kg)   SpO2 99%   BMI 19.12 kg/m   BP Readings from Last 3 Encounters:  06/21/19 122/74  03/24/19 126/82  12/07/18 128/81    Wt Readings from Last 3 Encounters:  06/21/19 101 lb 3.2 oz (45.9 kg)  03/24/19 105 lb 6.4 oz (47.8 kg)  12/07/18 102 lb 9.6 oz (46.5 kg)     General appearance: alert, cooperative and appears stated age Ears: normal TM's and external ear canals both ears Throat: lips, mucosa, and tongue normal; teeth and gums normal Neck: no adenopathy, no carotid bruit, supple, symmetrical, trachea midline and thyroid not enlarged, symmetric, no tenderness/mass/nodules Back: symmetric, no curvature. ROM normal. No CVA tenderness. Lungs: clear to auscultation bilaterally Heart: regular rate and rhythm, S1, S2 normal, no murmur, click, rub or gallop Abdomen: soft, non-tender; bowel sounds normal; no masses,  no organomegaly Pulses: 2+ and symmetric Skin: Skin color, texture, turgor normal. No rashes or lesions Lymph nodes: Cervical, supraclavicular, and axillary nodes normal.  Lab Results  Component Value Date   HGBA1C 5.4 10/03/2018   HGBA1C 5.9 02/21/2018   HGBA1C 6.1 04/05/2017    Lab Results  Component Value Date   CREATININE 0.63 06/21/2019   CREATININE 0.70 10/03/2018   CREATININE 0.61 08/11/2018    Lab Results  Component Value Date   WBC 6.8 06/21/2019   HGB 14.0 06/21/2019   HCT 41.0 06/21/2019   PLT 188.0 06/21/2019   GLUCOSE 79 06/21/2019   CHOL 187 10/03/2018   TRIG 86 10/03/2018   HDL 95 10/03/2018   LDLDIRECT 119.0 10/11/2014   LDLCALC 75 10/03/2018   ALT 11 06/21/2019   AST 19 06/21/2019   NA 134 (L) 06/21/2019   K 4.0 06/21/2019   CL 102 06/21/2019   CREATININE 0.63 06/21/2019   BUN 12 06/21/2019   CO2 25 06/21/2019   TSH 1.290 08/11/2018   HGBA1C 5.4 10/03/2018    MM 3D SCREEN BREAST BILATERAL  Result Date: 02/28/2019 CLINICAL DATA:  Screening. EXAM: DIGITAL SCREENING BILATERAL MAMMOGRAM WITH TOMO AND CAD COMPARISON:  Previous exam(s). ACR Breast Density Category b: There are scattered areas of fibroglandular density. FINDINGS: There are no findings suspicious for malignancy. Images were processed with CAD. IMPRESSION: No mammographic evidence of malignancy. A result letter of this screening  mammogram will be mailed directly to the patient. RECOMMENDATION: Screening mammogram in one year. (Code:SM-B-01Y) BI-RADS CATEGORY  1: Negative. Electronically Signed   By: Curlene Dolphin M.D.   On: 02/28/2019 14:52    Assessment & Plan:   Problem List Items Addressed This Visit      Unprioritized   Alcoholic peripheral neuropathy ([place])    Chronic and stable.  Continue Cymbalta, gabapentin, tramadol at current doses.  She is unable to work full time in any position that requires prolonged standing.       Relevant Medications   busPIRone (BUSPAR) 5 MG tablet   Fatty liver, alcoholic    Liver enzymes are normal       GAD (generalized anxiety disorder)    Using cymbalta for management of anxiety and melatonin for insomnia with poor results.  Frequent wakenings with panic feelings.  Trial of buspirone       Relevant Medications   busPIRone (BUSPAR) 5 MG tablet   Osteoporosis    Managed with Prolia due to prior fractures and contraindications to  other medications  next injection is due in July       Pernicious anemia - Primary    Intrinsic factor ab positive.  Continue home injections biweekly      Relevant Medications   cyanocobalamin (,VITAMIN B-12,) 1000 MCG/ML injection   Other Relevant Orders   Comprehensive metabolic panel (Completed)   Vitamin B12 (Completed)   CBC with Differential/Platelet (Completed)   Poor dentition    Resolved ,  All teeth have been pulled and she has upper and lower dentures.       Tobacco abuse    She is contemplating quitting and She is trying to cut back slowly.  Not interested in prescription therapy to help with cessation at this time        Other Visit Diagnoses    Vitamin D deficiency       Relevant Orders   VITAMIN D 25 Hydroxy (Vit-D Deficiency, Fractures) (Completed)      I have discontinued Arianie K. Vazques's SYRINGE 3CC/25GX1", estradiol, and Cyanocobalamin. I am also having her start on busPIRone, SYRINGE 3CC/25GX1",  cyanocobalamin, and traMADol. Additionally, I am having her maintain her dicyclomine, cyclobenzaprine, gabapentin, DULoxetine HCl, traMADol, and omeprazole.  Meds ordered this encounter  Medications  . busPIRone (BUSPAR) 5 MG tablet    Sig: Take 1 tablet (5 mg total) by mouth 3 (three) times daily.    Dispense:  90 tablet    Refill:  1  . Syringe/Needle, Disp, (SYRINGE 3CC/25GX1") 25G X 1" 3 ML MISC    Sig: Use for b12 injections    Dispense:  50 each    Refill:  0  . cyanocobalamin (,VITAMIN B-12,) 1000 MCG/ML injection    Sig: Inject 1 mL (1,000 mcg total) into the muscle every 14 (fourteen) days.    Dispense:  6 mL    Refill:  3    90 day supply ( 6 dose needed )  . traMADol (ULTRAM) 50 MG tablet    Sig: Take 1 tablet (50 mg total) by mouth every 8 (eight) hours as needed for up to 5 days.    Dispense:  90 tablet    Refill:  4    Medications Discontinued During This Encounter  Medication Reason  . Syringe/Needle, Disp, (SYRINGE 3CC/25GX1") 25G X 1" 3 ML MISC   . Cyanocobalamin 1000 MCG/ML KIT   . estradiol (ESTRACE VAGINAL) 0.1 MG/GM vaginal cream     Follow-up: No follow-ups on file.   Crecencio Mc, MD

## 2019-06-21 NOTE — Assessment & Plan Note (Addendum)
Using cymbalta for management of anxiety and melatonin for insomnia with poor results.  Frequent wakenings with panic feelings.  Trial of buspirone

## 2019-06-22 ENCOUNTER — Telehealth: Payer: Self-pay | Admitting: Internal Medicine

## 2019-06-22 ENCOUNTER — Ambulatory Visit: Payer: Self-pay | Admitting: Family Medicine

## 2019-06-22 LAB — CBC WITH DIFFERENTIAL/PLATELET
Basophils Absolute: 0.1 10*3/uL (ref 0.0–0.1)
Basophils Relative: 1 % (ref 0.0–3.0)
Eosinophils Absolute: 0.1 10*3/uL (ref 0.0–0.7)
Eosinophils Relative: 1 % (ref 0.0–5.0)
HCT: 41 % (ref 36.0–46.0)
Hemoglobin: 14 g/dL (ref 12.0–15.0)
Lymphocytes Relative: 43.3 % (ref 12.0–46.0)
Lymphs Abs: 3 10*3/uL (ref 0.7–4.0)
MCHC: 34 g/dL (ref 30.0–36.0)
MCV: 99.6 fl (ref 78.0–100.0)
Monocytes Absolute: 0.5 10*3/uL (ref 0.1–1.0)
Monocytes Relative: 8 % (ref 3.0–12.0)
Neutro Abs: 3.2 10*3/uL (ref 1.4–7.7)
Neutrophils Relative %: 46.7 % (ref 43.0–77.0)
Platelets: 188 10*3/uL (ref 150.0–400.0)
RBC: 4.12 Mil/uL (ref 3.87–5.11)
RDW: 13.5 % (ref 11.5–15.5)
WBC: 6.8 10*3/uL (ref 4.0–10.5)

## 2019-06-22 LAB — VITAMIN B12: Vitamin B-12: 522 pg/mL (ref 211–911)

## 2019-06-22 LAB — COMPREHENSIVE METABOLIC PANEL
ALT: 11 U/L (ref 0–35)
AST: 19 U/L (ref 0–37)
Albumin: 4.3 g/dL (ref 3.5–5.2)
Alkaline Phosphatase: 54 U/L (ref 39–117)
BUN: 12 mg/dL (ref 6–23)
CO2: 25 mEq/L (ref 19–32)
Calcium: 8.8 mg/dL (ref 8.4–10.5)
Chloride: 102 mEq/L (ref 96–112)
Creatinine, Ser: 0.63 mg/dL (ref 0.40–1.20)
GFR: 97.23 mL/min (ref >60.00)
Glucose, Bld: 79 mg/dL (ref 70–99)
Potassium: 4 mEq/L (ref 3.5–5.1)
Sodium: 134 mEq/L — ABNORMAL LOW (ref 135–145)
Total Bilirubin: 0.5 mg/dL (ref 0.2–1.2)
Total Protein: 6.9 g/dL (ref 6.0–8.3)

## 2019-06-22 LAB — VITAMIN D 25 HYDROXY (VIT D DEFICIENCY, FRACTURES): VITD: 87.09 ng/mL (ref 30.00–100.00)

## 2019-06-22 NOTE — Assessment & Plan Note (Signed)
Chronic and stable.  Continue Cymbalta, gabapentin, tramadol at current doses.  She is unable to work full time in any position that requires prolonged standing.

## 2019-06-22 NOTE — Telephone Encounter (Signed)
Received approval for Prolia ok to schedule I saw the Vitamin D and calcium we ok to schedule?

## 2019-06-22 NOTE — Assessment & Plan Note (Signed)
Managed with Prolia due to prior fractures and contraindications to other medications  next injection is due in July

## 2019-06-22 NOTE — Telephone Encounter (Signed)
Yes I believe she is due in June but I have not finished my note

## 2019-06-22 NOTE — Assessment & Plan Note (Signed)
She is contemplating quitting and She is trying to cut back slowly.  Not interested in prescription therapy to help with cessation at this time

## 2019-06-22 NOTE — Assessment & Plan Note (Signed)
Liver enzymes are normal.

## 2019-06-22 NOTE — Assessment & Plan Note (Signed)
Intrinsic factor ab positive.  Continue home injections biweekly

## 2019-06-22 NOTE — Assessment & Plan Note (Signed)
Resolved ,  All teeth have been pulled and she has upper and lower dentures.

## 2019-06-23 NOTE — Telephone Encounter (Signed)
Error see previous note .  °

## 2019-06-23 NOTE — Telephone Encounter (Signed)
Patient scheduled 08/03/19

## 2019-07-22 DIAGNOSIS — J0140 Acute pansinusitis, unspecified: Secondary | ICD-10-CM | POA: Diagnosis not present

## 2019-07-22 DIAGNOSIS — J309 Allergic rhinitis, unspecified: Secondary | ICD-10-CM | POA: Diagnosis not present

## 2019-08-03 ENCOUNTER — Ambulatory Visit (INDEPENDENT_AMBULATORY_CARE_PROVIDER_SITE_OTHER): Payer: PPO

## 2019-08-03 ENCOUNTER — Other Ambulatory Visit: Payer: Self-pay

## 2019-08-03 DIAGNOSIS — M810 Age-related osteoporosis without current pathological fracture: Secondary | ICD-10-CM | POA: Diagnosis not present

## 2019-08-03 MED ORDER — DENOSUMAB 60 MG/ML ~~LOC~~ SOSY
60.0000 mg | PREFILLED_SYRINGE | Freq: Once | SUBCUTANEOUS | Status: AC
  Administered 2019-08-03: 60 mg via SUBCUTANEOUS

## 2019-08-03 NOTE — Progress Notes (Signed)
Patient presented for 6-month Prolia injection SQ to left arm. Patient tolerated well. 

## 2019-08-18 ENCOUNTER — Ambulatory Visit: Payer: PPO

## 2019-09-05 ENCOUNTER — Other Ambulatory Visit: Payer: Self-pay | Admitting: Internal Medicine

## 2019-09-19 ENCOUNTER — Telehealth: Payer: Self-pay | Admitting: Internal Medicine

## 2019-09-19 NOTE — Telephone Encounter (Signed)
Pt called in stated that she has a rash and it is moving around its on her back and legs now she stated that she has had it before please advise.

## 2019-09-19 NOTE — Telephone Encounter (Signed)
Patient stated it happens when she is very stressed. It starts at panty line and bra line. She has taken benadryl and took two doses of buspirone. Today is hasn't gotten worse but she did break out with hives. No new sheets,  detergent, fevers, chills, she doesn't feel anything different except her stress. Patient refused appointment for today. She would like to wait to see if it calms down. If the rash does not she will call for an appointment.

## 2019-09-19 NOTE — Telephone Encounter (Signed)
Will you please triage °

## 2019-09-29 ENCOUNTER — Other Ambulatory Visit: Payer: Self-pay

## 2019-09-29 ENCOUNTER — Encounter: Payer: Self-pay | Admitting: Internal Medicine

## 2019-09-29 ENCOUNTER — Ambulatory Visit (INDEPENDENT_AMBULATORY_CARE_PROVIDER_SITE_OTHER): Payer: PPO | Admitting: Internal Medicine

## 2019-09-29 VITALS — BP 148/84 | HR 75 | Temp 97.6°F | Resp 16 | Ht 61.0 in | Wt 98.2 lb

## 2019-09-29 DIAGNOSIS — F5105 Insomnia due to other mental disorder: Secondary | ICD-10-CM | POA: Diagnosis not present

## 2019-09-29 DIAGNOSIS — F409 Phobic anxiety disorder, unspecified: Secondary | ICD-10-CM | POA: Diagnosis not present

## 2019-09-29 DIAGNOSIS — E538 Deficiency of other specified B group vitamins: Secondary | ICD-10-CM

## 2019-09-29 MED ORDER — ALPRAZOLAM 1 MG PO TABS
1.0000 mg | ORAL_TABLET | Freq: Every evening | ORAL | 0 refills | Status: DC | PRN
Start: 2019-09-29 — End: 2019-10-31

## 2019-09-29 MED ORDER — TRAZODONE HCL 100 MG PO TABS
ORAL_TABLET | ORAL | 1 refills | Status: DC
Start: 2019-09-29 — End: 2020-05-20

## 2019-09-29 MED ORDER — CYANOCOBALAMIN 1000 MCG/ML IJ SOLN
1000.0000 ug | Freq: Once | INTRAMUSCULAR | Status: AC
  Administered 2019-09-29: 1000 ug via INTRAMUSCULAR

## 2019-09-29 MED ORDER — DULOXETINE HCL 40 MG PO CPEP: 40.0000 mg | ORAL_CAPSULE | Freq: Every day | ORAL | 5 refills | Status: DC

## 2019-09-29 NOTE — Patient Instructions (Signed)
I want you use alprazolam AS A LAST RESORT BECAUSE YOU WILL GET ADDICTED TO IT IF YOU USE IT DAILY .   RESUME CYMBALTA  CONTINUE DOUBLE DOSE OF MELATONIN AT 7 PM  ADDING TRAZODONE 50 TO 100 MG 30 TO 60 MINUTES BEFORE BEDTIME  IF YOU ARE STILL WIDE AWAKE AT BEDTIME , YOU MAY USE THE ALPRAZOLAM    FOLLOW UP IN ONE MONTH

## 2019-09-29 NOTE — Progress Notes (Signed)
Subjective:  Patient ID: Kathy [name], female    DOB: 19-Jul-1960  Age: 59 y.o. MRN: [id number]  CC: The primary encounter diagnosis was B12 deficiency. A diagnosis of Insomnia due to anxiety and fear was also pertinent to this visit.    HPI Kathy [name] presents for treatment of insomnia.    This visit occurred during the SARS-CoV-2 public health emergency.  Safety protocols were in place, including screening questions prior to the visit, additional usage of staff PPE, and extensive cleaning of exam room while observing appropriate contact time as indicated for disinfecting solutions.   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  She was  prescribed buspirone in April . Has not been taking regularly  Until recently,  And last week developed "hives" that became recurrent, in different places.  Took benadryl for the reaction,  Did not tolerate benadryl due to head pounding.  Went to friend's house who gave her alprazolam  And lorazepam .  Took a  lorazepam for insomnia , and for the last 7 nights has been taking 1 mg alprazolam To help her rest at night.    She was concerned about taking it so she Talked to [place] and told she was not addicted to or detoxed from  to benzodiazepines    This visit occurred during the SARS-CoV-2 public health emergency.  Safety protocols were in place, including screening questions prior to the visit, additional usage of staff PPE, and extensive cleaning of exam room while observing appropriate contact time as indicated for disinfecting solutions.   59 yr old female with history of alcoholims complicated by neuropathy   Outpatient Medications Prior to Visit  Medication Sig Dispense Refill  . cyanocobalamin (,VITAMIN B-12,) 1000 MCG/ML injection Inject 1 mL (1,000 mcg total) into the muscle every 14 (fourteen) days. 6 mL 3  . cyclobenzaprine (FLEXERIL)  10 MG tablet Take 1 tablet (10 mg total) by mouth 3 (three) times daily as needed for muscle spasms. Involving the legs and feet 90 tablet 1  . dicyclomine (BENTYL) 20 MG tablet Take 1 tablet (20 mg total) by mouth every 6 (six) hours as needed for spasms. 120 tablet 5  . gabapentin (NEURONTIN) 400 MG capsule Take 2 capsules (800 mg total) by mouth 3 (three) times daily. 180 capsule 5  . omeprazole (PRILOSEC) 40 MG capsule Take 1 capsule (40 mg total) by mouth 2 (two) times daily. 180 capsule 1  . Syringe/Needle, Disp, (SYRINGE 3CC/25GX1") 25G X 1" 3 ML MISC Use for b12 injections 50 each 0  . traMADol (ULTRAM) 50 MG tablet TAKE ONE TABLET BY MOUTH EVERY 6 HOURS AS NEEDED 90 tablet 2  . DULoxetine HCl 40 MG CPEP Take 40 mg by mouth daily. 30 capsule 1  . busPIRone (BUSPAR) 5 MG tablet TAKE ONE TABLET BY MOUTH 3 TIMES DAILY (Patient not taking: Reported on 09/29/2019) 90 tablet 1   No facility-administered medications prior to visit.    Review of Systems;  Patient denies headache, fevers, malaise, unintentional weight loss, skin rash, eye pain, sinus congestion and sinus pain, sore throat, dysphagia,  hemoptysis , cough, dyspnea, wheezing, chest pain, palpitations, orthopnea, edema, abdominal pain, nausea, melena, diarrhea, constipation, flank pain, dysuria, hematuria, urinary  Frequency, nocturia, numbness, tingling, seizures,  Focal weakness, Loss of consciousness,  Tremor, insomnia, depression, anxiety, and suicidal ideation.      Objective:  BP (!) 148/84 (  BP Location: Left Arm, Patient Position: Sitting, Cuff Size: Normal)   Pulse 75   Temp 97.6 F (36.4 C) (Oral)   Resp 16   Ht 5\' 1"  (1.549 m)   Wt 98 lb 3.2 oz (44.5 kg)   SpO2 99%   BMI 18.55 kg/m   BP Readings from Last 3 Encounters:  09/29/19 (!) 148/84  06/21/19 122/74  03/24/19 126/82    Wt Readings from Last 3 Encounters:  09/29/19 98 lb 3.2 oz (44.5 kg)  06/21/19 101 lb 3.2 oz (45.9 kg)  03/24/19 105 lb 6.4 oz (47.8  kg)    General appearance: alert, cooperative and appears stated age Ears: normal TM's and external ear canals both ears Throat: lips, mucosa, and tongue normal; teeth and gums normal Neck: no adenopathy, no carotid bruit, supple, symmetrical, trachea midline and thyroid not enlarged, symmetric, no tenderness/mass/nodules Back: symmetric, no curvature. ROM normal. No CVA tenderness. Lungs: clear to auscultation bilaterally Heart: regular rate and rhythm, S1, S2 normal, no murmur, click, rub or gallop Abdomen: soft, non-tender; bowel sounds normal; no masses,  no organomegaly Pulses: 2+ and symmetric Skin: Skin color, texture, turgor normal. No rashes or lesions Lymph nodes: Cervical, supraclavicular, and axillary nodes normal.  Lab Results  Component Value Date   HGBA1C 5.4 10/03/2018   HGBA1C 5.9 02/21/2018   HGBA1C 6.1 04/05/2017    Lab Results  Component Value Date   CREATININE 0.63 06/21/2019   CREATININE 0.70 10/03/2018   CREATININE 0.61 08/11/2018    Lab Results  Component Value Date   WBC 6.8 06/21/2019   HGB 14.0 06/21/2019   HCT 41.0 06/21/2019   PLT 188.0 06/21/2019   GLUCOSE 79 06/21/2019   CHOL 187 10/03/2018   TRIG 86 10/03/2018   HDL 95 10/03/2018   LDLDIRECT 119.0 10/11/2014   LDLCALC 75 10/03/2018   ALT 11 06/21/2019   AST 19 06/21/2019   NA 134 (L) 06/21/2019   K 4.0 06/21/2019   CL 102 06/21/2019   CREATININE 0.63 06/21/2019   BUN 12 06/21/2019   CO2 25 06/21/2019   TSH 1.290 08/11/2018   HGBA1C 5.4 10/03/2018    MM 3D SCREEN BREAST BILATERAL  Result Date: 02/28/2019 CLINICAL DATA:  Screening. EXAM: DIGITAL SCREENING BILATERAL MAMMOGRAM WITH TOMO AND CAD COMPARISON:  Previous exam(s). ACR Breast Density Category b: There are scattered areas of fibroglandular density. FINDINGS: There are no findings suspicious for malignancy. Images were processed with CAD. IMPRESSION: No mammographic evidence of malignancy. A result letter of this screening  mammogram will be mailed directly to the patient. RECOMMENDATION: Screening mammogram in one year. (Code:SM-B-01Y) BI-RADS CATEGORY  1: Negative. Electronically Signed   By: Curlene Dolphin M.D.   On: 02/28/2019 14:52    Assessment & Plan:   Problem List Items Addressed This Visit      Unprioritized   Insomnia due to anxiety and fear    Educated on the similarity of alcohol with benzodiazepines with regard to addiction,   Cautioned to avoid use of benzodiazepines on a daily basis.  Advised to use  Trazodone  and melatonin  First  Prn alprazolam  1 mg to be used not  more than 20 per month        Other Visit Diagnoses    B12 deficiency    -  Primary   Relevant Medications   cyanocobalamin ((VITAMIN B-12)) injection 1,000 mcg (Completed)      I provided  30 minutes of  face-to-face time during this  encounter reviewing patient's current problems and past surgeries, labs and imaging studies, providing counseling on the above mentioned problems , and coordination  of care .  I have discontinued Lory K. Goerner's busPIRone. I am also having her start on traZODone and ALPRAZolam. Additionally, I am having her maintain her dicyclomine, cyclobenzaprine, gabapentin, traMADol, omeprazole, SYRINGE 3CC/25GX1", cyanocobalamin, and DULoxetine HCl. We administered cyanocobalamin.  Meds ordered this encounter  Medications  . traZODone (DESYREL) 100 MG tablet    Sig: 1/2 to 1 tablet 30 to 60 minutes before bedtime    Dispense:  90 tablet    Refill:  1  . DULoxetine HCl 40 MG CPEP    Sig: Take 40 mg by mouth daily.    Dispense:  30 capsule    Refill:  5    KEEP ON FILE FOR FUTURE REFILLS  . ALPRAZolam (XANAX) 1 MG tablet    Sig: Take 1 tablet (1 mg total) by mouth at bedtime as needed for anxiety or sleep.    Dispense:  20 tablet    Refill:  0  . cyanocobalamin ((VITAMIN B-12)) injection 1,000 mcg    Medications Discontinued During This Encounter  Medication Reason  . busPIRone (BUSPAR) 5 MG  tablet Allergic reaction  . DULoxetine HCl 40 MG CPEP Reorder    Follow-up: Return in about 4 weeks (around 10/27/2019).   Crecencio Mc, MD

## 2019-09-29 NOTE — Assessment & Plan Note (Addendum)
Educated on the similarity of alcohol with benzodiazepines with regard to addiction,   Cautioned to avoid use of benzodiazepines on a daily basis.  Advised to use  Trazodone  and melatonin  First  Prn alprazolam  1 mg to be used not  more than 20 per month

## 2019-10-03 ENCOUNTER — Telehealth: Payer: Self-pay | Admitting: Internal Medicine

## 2019-10-03 DIAGNOSIS — I10 Essential (primary) hypertension: Secondary | ICD-10-CM | POA: Insufficient documentation

## 2019-10-03 MED ORDER — AMLODIPINE BESYLATE 2.5 MG PO TABS: 2.5000 mg | ORAL_TABLET | Freq: Every day | ORAL | 3 refills | Status: DC

## 2019-10-03 NOTE — Assessment & Plan Note (Signed)
Confirmed with home readings x 3 of 968 systolic.  Starting amlodipine 2.5 mg daily

## 2019-10-03 NOTE — Telephone Encounter (Signed)
Patient called in her BP results; 159/82 159/84 & 148/94. Patient has started to cut down on soda and drink water.

## 2019-10-14 ENCOUNTER — Ambulatory Visit: Payer: PPO

## 2019-10-23 ENCOUNTER — Ambulatory Visit: Payer: PPO

## 2019-10-31 ENCOUNTER — Other Ambulatory Visit: Payer: Self-pay | Admitting: Internal Medicine

## 2019-10-31 ENCOUNTER — Other Ambulatory Visit: Payer: Self-pay

## 2019-10-31 ENCOUNTER — Ambulatory Visit (INDEPENDENT_AMBULATORY_CARE_PROVIDER_SITE_OTHER): Payer: PPO

## 2019-10-31 DIAGNOSIS — E538 Deficiency of other specified B group vitamins: Secondary | ICD-10-CM

## 2019-10-31 MED ORDER — CYANOCOBALAMIN 1000 MCG/ML IJ SOLN
1000.0000 ug | Freq: Once | INTRAMUSCULAR | Status: AC
  Administered 2019-10-31: 1000 ug via INTRAMUSCULAR

## 2019-10-31 NOTE — Telephone Encounter (Signed)
Refill request for xanax, last seen 09-29-19, last filled 09-29-19.  Please advise.

## 2019-10-31 NOTE — Progress Notes (Signed)
Patient presented for B 12 injection to left deltoid, patient voiced no concerns nor showed any signs of distress during injection.  Patient would also like a refill of  her Xanax.

## 2019-11-02 ENCOUNTER — Telehealth: Payer: Self-pay | Admitting: Internal Medicine

## 2019-11-02 NOTE — Telephone Encounter (Signed)
I know of no drug by that name. She needs to make an appt to discuss

## 2019-11-02 NOTE — Telephone Encounter (Signed)
Patient would like to know if Dr. Derrel Nip would prescribe Halodine, spray in nose.

## 2019-11-03 DIAGNOSIS — I10 Essential (primary) hypertension: Secondary | ICD-10-CM | POA: Diagnosis not present

## 2019-11-03 DIAGNOSIS — J0140 Acute pansinusitis, unspecified: Secondary | ICD-10-CM | POA: Diagnosis not present

## 2019-11-03 NOTE — Telephone Encounter (Signed)
Spoke with pt and scheduled her for a virtual visit to discuss.

## 2019-11-06 ENCOUNTER — Telehealth: Payer: Self-pay | Admitting: Internal Medicine

## 2019-11-06 NOTE — Telephone Encounter (Signed)
Pt called to let you know that her BP is still 157/80 with her taking double Norvasc. She went to [facility] and tested negative for covid but they told her that Dr. Derrel Nip might want to add something extra with the Norvasc.

## 2019-11-06 NOTE — Telephone Encounter (Signed)
Pt is scheduled for an appt on Thursday the 16th.

## 2019-11-09 ENCOUNTER — Encounter: Payer: Self-pay | Admitting: Internal Medicine

## 2019-11-09 ENCOUNTER — Telehealth (INDEPENDENT_AMBULATORY_CARE_PROVIDER_SITE_OTHER): Payer: PPO | Admitting: Internal Medicine

## 2019-11-09 DIAGNOSIS — F409 Phobic anxiety disorder, unspecified: Secondary | ICD-10-CM | POA: Diagnosis not present

## 2019-11-09 DIAGNOSIS — I10 Essential (primary) hypertension: Secondary | ICD-10-CM | POA: Diagnosis not present

## 2019-11-09 DIAGNOSIS — F5105 Insomnia due to other mental disorder: Secondary | ICD-10-CM | POA: Diagnosis not present

## 2019-11-09 MED ORDER — AMLODIPINE BESYLATE 5 MG PO TABS: 5.0000 mg | ORAL_TABLET | Freq: Every day | ORAL | 1 refills | Status: DC

## 2019-11-09 MED ORDER — METOPROLOL SUCCINATE ER 25 MG PO TB24: 25.0000 mg | ORAL_TABLET | Freq: Every day | ORAL | 1 refills | Status: DC

## 2019-11-09 NOTE — Progress Notes (Signed)
Virtual Visit via [place]  This visit type was conducted due to national recommendations for restrictions regarding the COVID-19 pandemic (e.g. social distancing).  This format is felt to be most appropriate for this patient at this time.  All issues noted in this document were discussed and addressed.  No physical exam was performed (except for noted visual exam findings with Video Visits).   I connected with@ on 11/09/19 at 12:00 PM EDT by a video enabled telemedicine application  and verified that I am speaking with the correct person using two identifiers. Location patient: home Location provider: work or home office Persons participating in the virtual visit: patient, provider  I discussed the limitations, risks, security and privacy concerns of performing an evaluation and management service by telephone and the availability of in person appointments. I also discussed with the patient that there may be a patient responsible charge related to this service. The patient expressed understanding and agreed to proceed.  Reason for visit: follow up on hypertension , sinusitis   HPI:  1) Treated for sinusitis by [facility] with augmentin and flonase on Sept 10. Symptoms are resolving,  No diarrhea,  Eats yogurt daily   2) HTN: taking 5 mg amlodipine  BP still elevated .  No side effects . Home readings still > 412 systolic  3) Insomnia:  Improved with prn use of alprazolam . Has not requested early refills    ROS: See pertinent positives and negatives per HPI.  Past Medical History:  Diagnosis Date  . Alcohol abuse   . Chicken pox   . Colon polyps   . Depression   . Hernia of abdominal cavity   . Hip fracture ([place]) right  . Neuropathy     No past surgical history on file.  Family History  Problem Relation Age of Onset  . Hypertension Mother   . Osteoporosis Mother   . Meniere's disease Mother   . Cancer Father 45       prostate  . Colon cancer Paternal Grandmother   . Alcohol  abuse Other     SOCIAL HX:  reports that she has been smoking cigarettes. She has a 30.00 pack-year smoking history. She has never used smokeless tobacco. She reports that she does not drink alcohol and does not use drugs.   Current Outpatient Medications:  .  ALPRAZolam (XANAX) 1 MG tablet, TAKE 1 TABLET BY MOUTH AT BEDTIME AS NEEDED FOR ANXIETY OR SLEEP, Disp: 20 tablet, Rfl: 0 .  amLODipine (NORVASC) 5 MG tablet, Take 1 tablet (5 mg total) by mouth daily., Disp: 90 tablet, Rfl: 1 .  cyanocobalamin (,VITAMIN B-12,) 1000 MCG/ML injection, Inject 1 mL (1,000 mcg total) into the muscle every 14 (fourteen) days., Disp: 6 mL, Rfl: 3 .  cyclobenzaprine (FLEXERIL) 10 MG tablet, Take 1 tablet (10 mg total) by mouth 3 (three) times daily as needed for muscle spasms. Involving the legs and feet, Disp: 90 tablet, Rfl: 1 .  dicyclomine (BENTYL) 20 MG tablet, Take 1 tablet (20 mg total) by mouth every 6 (six) hours as needed for spasms., Disp: 120 tablet, Rfl: 5 .  DULoxetine HCl 40 MG CPEP, Take 40 mg by mouth daily., Disp: 30 capsule, Rfl: 5 .  gabapentin (NEURONTIN) 400 MG capsule, Take 2 capsules (800 mg total) by mouth 3 (three) times daily., Disp: 180 capsule, Rfl: 5 .  omeprazole (PRILOSEC) 40 MG capsule, Take 1 capsule (40 mg total) by mouth 2 (two) times daily., Disp: 180 capsule, Rfl: 1 .  Syringe/Needle, Disp, (SYRINGE 3CC/25GX1") 25G X 1" 3 ML MISC, Use for b12 injections, Disp: 50 each, Rfl: 0 .  traMADol (ULTRAM) 50 MG tablet, TAKE ONE TABLET BY MOUTH EVERY 6 HOURS AS NEEDED, Disp: 90 tablet, Rfl: 2 .  traZODone (DESYREL) 100 MG tablet, 1/2 to 1 tablet 30 to 60 minutes before bedtime, Disp: 90 tablet, Rfl: 1 .  metoprolol succinate (TOPROL-XL) 25 MG 24 hr tablet, Take 1 tablet (25 mg total) by mouth at bedtime., Disp: 90 tablet, Rfl: 1  EXAM:  VITALS per patient if applicable:  GENERAL: alert, oriented, appears well and in no acute distress  HEENT: atraumatic, conjunttiva clear, no  obvious abnormalities on inspection of external nose and ears  NECK: normal movements of the head and neck  LUNGS: on inspection no signs of respiratory distress, breathing rate appears normal, no obvious gross SOB, gasping or wheezing  CV: no obvious cyanosis  MS: moves all visible extremities without noticeable abnormality  PSYCH/NEURO: pleasant and cooperative, no obvious depression or anxiety, speech and thought processing grossly intact  ASSESSMENT AND PLAN:  Discussed the following assessment and plan:  Insomnia due to anxiety and fear  Essential hypertension  Insomnia due to anxiety and fear Reviewed the similarity of alcohol with benzodiazepines with regard to addiction,   Cautioned to avoid use of benzodiazepines on a daily basis.  Advised to use  Trazodone  and melatonin  First  Prn alprazolam  1 mg to be used not  more than 20 per month /  Refills given   Essential hypertension Not at goal on 5 mg amlodipie.  Adding metoprolol xl 25 mg daily     I discussed the assessment and treatment plan with the patient. The patient was provided an opportunity to ask questions and all were answered. The patient agreed with the plan and demonstrated an understanding of the instructions.   The patient was advised to call back or seek an in-person evaluation if the symptoms worsen or if the condition fails to improve as anticipated.  I provided 20 minutes of non-face-to-face time during this encounter.   Crecencio Mc, MD

## 2019-11-11 NOTE — Assessment & Plan Note (Addendum)
Reviewed the similarity of alcohol with benzodiazepines with regard to addiction,   Cautioned to avoid use of benzodiazepines on a daily basis.  Advised to use  Trazodone  and melatonin  First  Prn alprazolam  1 mg to be used not  more than 20 per month /  Refills given

## 2019-11-11 NOTE — Assessment & Plan Note (Signed)
Not at goal on 5 mg amlodipie.  Adding metoprolol xl 25 mg daily

## 2019-11-16 ENCOUNTER — Ambulatory Visit (INDEPENDENT_AMBULATORY_CARE_PROVIDER_SITE_OTHER): Payer: PPO

## 2019-11-16 ENCOUNTER — Other Ambulatory Visit: Payer: Self-pay

## 2019-11-16 DIAGNOSIS — E538 Deficiency of other specified B group vitamins: Secondary | ICD-10-CM | POA: Diagnosis not present

## 2019-11-16 MED ORDER — CYANOCOBALAMIN 1000 MCG/ML IJ SOLN
1000.0000 ug | Freq: Once | INTRAMUSCULAR | Status: AC
  Administered 2019-11-16: 1000 ug via INTRAMUSCULAR

## 2019-11-16 NOTE — Progress Notes (Signed)
Patient presented for B 12 injection to left deltoid, patient voiced no concerns nor showed any signs of distress during injection. 

## 2019-11-30 ENCOUNTER — Ambulatory Visit (INDEPENDENT_AMBULATORY_CARE_PROVIDER_SITE_OTHER): Payer: PPO

## 2019-11-30 ENCOUNTER — Other Ambulatory Visit: Payer: Self-pay

## 2019-11-30 DIAGNOSIS — E538 Deficiency of other specified B group vitamins: Secondary | ICD-10-CM

## 2019-11-30 MED ORDER — CYANOCOBALAMIN 1000 MCG/ML IJ SOLN
1000.0000 ug | Freq: Once | INTRAMUSCULAR | Status: AC
  Administered 2019-11-30: 1000 ug via INTRAMUSCULAR

## 2019-11-30 NOTE — Progress Notes (Signed)
Patient presented for B 12 injection to right deltoid, patient voiced no concerns nor showed any signs of distress during injection. 

## 2019-12-20 ENCOUNTER — Ambulatory Visit (INDEPENDENT_AMBULATORY_CARE_PROVIDER_SITE_OTHER): Payer: PPO

## 2019-12-20 ENCOUNTER — Other Ambulatory Visit: Payer: Self-pay

## 2019-12-20 ENCOUNTER — Telehealth: Payer: Self-pay | Admitting: Internal Medicine

## 2019-12-20 DIAGNOSIS — E538 Deficiency of other specified B group vitamins: Secondary | ICD-10-CM | POA: Diagnosis not present

## 2019-12-20 DIAGNOSIS — I10 Essential (primary) hypertension: Secondary | ICD-10-CM

## 2019-12-20 MED ORDER — AMLODIPINE BESYLATE 2.5 MG PO TABS: 2.5000 mg | ORAL_TABLET | Freq: Every day | ORAL | 3 refills | Status: DC

## 2019-12-20 MED ORDER — CYANOCOBALAMIN 1000 MCG/ML IJ SOLN
1000.0000 ug | Freq: Once | INTRAMUSCULAR | Status: AC
  Administered 2019-12-20: 1000 ug via INTRAMUSCULAR

## 2019-12-20 NOTE — Progress Notes (Signed)
Patient presented for B 12 injection to right deltoid, patient voiced no concerns nor showed any signs of distress during injection.  Patient also wanted to tell PCP that she has been taking 7.5 mg of Norvasc and her BP has been perfect. She would like to have PCP send in both 5 mg and 2.5mg  of Norvasc to make the 7.5mg . please advise.

## 2019-12-20 NOTE — Assessment & Plan Note (Signed)
TAKING 7.5 MG DAILY FOR BEST CONTROL.  PATIENT REQUESTED 5 MG AND 2.5 MG RX'S SENT TO PHARMACY

## 2019-12-20 NOTE — Telephone Encounter (Signed)
Chart updated with 2.5 mg additional rx and rx sent

## 2020-01-03 ENCOUNTER — Other Ambulatory Visit: Payer: Self-pay

## 2020-01-03 ENCOUNTER — Ambulatory Visit: Payer: PPO

## 2020-01-03 ENCOUNTER — Ambulatory Visit (INDEPENDENT_AMBULATORY_CARE_PROVIDER_SITE_OTHER): Payer: PPO

## 2020-01-03 DIAGNOSIS — E538 Deficiency of other specified B group vitamins: Secondary | ICD-10-CM | POA: Diagnosis not present

## 2020-01-03 MED ORDER — CYANOCOBALAMIN 1000 MCG/ML IJ SOLN
1000.0000 ug | Freq: Once | INTRAMUSCULAR | Status: AC
  Administered 2020-01-03: 1000 ug via INTRAMUSCULAR

## 2020-01-03 NOTE — Progress Notes (Signed)
Patient presented for B 12 injection to left deltoid, patient voiced no concerns nor showed any signs of distress during injection. 

## 2020-01-04 ENCOUNTER — Encounter: Payer: Self-pay | Admitting: *Deleted

## 2020-01-12 ENCOUNTER — Other Ambulatory Visit: Payer: Self-pay | Admitting: Internal Medicine

## 2020-01-12 NOTE — Telephone Encounter (Signed)
Called and spoke to [employer]. Kathy [name] verbalized understanding and scheduled for repeat labs on 02/19/2020 for 4pm.

## 2020-01-12 NOTE — Telephone Encounter (Signed)
Refill for 30 days only.  FACE TO FACE OFFICE VISIT NEEDED prior to any more refills and every 3 months

## 2020-01-22 ENCOUNTER — Telehealth: Payer: Self-pay | Admitting: Internal Medicine

## 2020-01-22 MED ORDER — AMLODIPINE BESYLATE 5 MG PO TABS
5.0000 mg | ORAL_TABLET | Freq: Every day | ORAL | 1 refills | Status: DC
Start: 2020-01-22 — End: 2020-01-23

## 2020-01-22 NOTE — Telephone Encounter (Signed)
Patient called in for refill for amLODipine (NORVASC) 5 MG tablet

## 2020-01-23 ENCOUNTER — Telehealth: Payer: Self-pay

## 2020-01-23 MED ORDER — AMLODIPINE BESYLATE 5 MG PO TABS
5.0000 mg | ORAL_TABLET | Freq: Two times a day (BID) | ORAL | 1 refills | Status: DC
Start: 2020-01-23 — End: 2020-02-19

## 2020-01-23 NOTE — Telephone Encounter (Signed)
Received a fax from [facility] stating that pt is stated to them that she is taking Amlodipine 5 mg BID. Dr. Derrel Nip gave a verbal to fill medication as BID. Medication has been sent to pharmacy.

## 2020-01-29 ENCOUNTER — Ambulatory Visit (INDEPENDENT_AMBULATORY_CARE_PROVIDER_SITE_OTHER): Payer: PPO

## 2020-01-29 ENCOUNTER — Other Ambulatory Visit: Payer: Self-pay

## 2020-01-29 DIAGNOSIS — E538 Deficiency of other specified B group vitamins: Secondary | ICD-10-CM | POA: Diagnosis not present

## 2020-01-29 MED ORDER — CYANOCOBALAMIN 1000 MCG/ML IJ SOLN
1000.0000 ug | Freq: Once | INTRAMUSCULAR | Status: AC
  Administered 2020-01-29: 1000 ug via INTRAMUSCULAR

## 2020-01-29 NOTE — Progress Notes (Signed)
Patient presented for B 12 injection to left deltoid, patient voiced no concerns nor showed any signs of distress during injection. 

## 2020-02-06 ENCOUNTER — Ambulatory Visit (INDEPENDENT_AMBULATORY_CARE_PROVIDER_SITE_OTHER): Payer: PPO

## 2020-02-06 ENCOUNTER — Other Ambulatory Visit: Payer: Self-pay

## 2020-02-06 DIAGNOSIS — E538 Deficiency of other specified B group vitamins: Secondary | ICD-10-CM | POA: Diagnosis not present

## 2020-02-06 DIAGNOSIS — M810 Age-related osteoporosis without current pathological fracture: Secondary | ICD-10-CM

## 2020-02-06 MED ORDER — DENOSUMAB 60 MG/ML ~~LOC~~ SOSY
60.0000 mg | PREFILLED_SYRINGE | Freq: Once | SUBCUTANEOUS | Status: AC
  Administered 2020-02-06: 60 mg via SUBCUTANEOUS

## 2020-02-06 NOTE — Progress Notes (Signed)
Patient presented for 6-month Prolia injection SQ to left arm. Patient tolerated well. 

## 2020-02-12 ENCOUNTER — Other Ambulatory Visit: Payer: Self-pay | Admitting: Internal Medicine

## 2020-02-12 ENCOUNTER — Other Ambulatory Visit: Payer: Self-pay | Admitting: Family Medicine

## 2020-02-12 DIAGNOSIS — Z1231 Encounter for screening mammogram for malignant neoplasm of breast: Secondary | ICD-10-CM

## 2020-02-19 ENCOUNTER — Other Ambulatory Visit: Payer: Self-pay

## 2020-02-19 ENCOUNTER — Encounter: Payer: Self-pay | Admitting: Internal Medicine

## 2020-02-19 ENCOUNTER — Ambulatory Visit (INDEPENDENT_AMBULATORY_CARE_PROVIDER_SITE_OTHER): Payer: PPO | Admitting: Internal Medicine

## 2020-02-19 VITALS — BP 122/68 | HR 74 | Temp 98.1°F | Resp 15 | Ht 61.0 in | Wt 101.4 lb

## 2020-02-19 DIAGNOSIS — G621 Alcoholic polyneuropathy: Secondary | ICD-10-CM

## 2020-02-19 DIAGNOSIS — E559 Vitamin D deficiency, unspecified: Secondary | ICD-10-CM

## 2020-02-19 DIAGNOSIS — I10 Essential (primary) hypertension: Secondary | ICD-10-CM

## 2020-02-19 DIAGNOSIS — F1021 Alcohol dependence, in remission: Secondary | ICD-10-CM | POA: Diagnosis not present

## 2020-02-19 DIAGNOSIS — K2100 Gastro-esophageal reflux disease with esophagitis, without bleeding: Secondary | ICD-10-CM

## 2020-02-19 DIAGNOSIS — K700 Alcoholic fatty liver: Secondary | ICD-10-CM | POA: Diagnosis not present

## 2020-02-19 DIAGNOSIS — Z1211 Encounter for screening for malignant neoplasm of colon: Secondary | ICD-10-CM

## 2020-02-19 DIAGNOSIS — F411 Generalized anxiety disorder: Secondary | ICD-10-CM | POA: Diagnosis not present

## 2020-02-19 DIAGNOSIS — E871 Hypo-osmolality and hyponatremia: Secondary | ICD-10-CM | POA: Diagnosis not present

## 2020-02-19 MED ORDER — TRAMADOL HCL 50 MG PO TABS: ORAL_TABLET | ORAL | 5 refills | Status: DC

## 2020-02-19 MED ORDER — ALPRAZOLAM 1 MG PO TABS
ORAL_TABLET | ORAL | 1 refills | Status: DC
Start: 2020-02-19 — End: 2020-04-13

## 2020-02-19 MED ORDER — AMLODIPINE BESYLATE 5 MG PO TABS
5.0000 mg | ORAL_TABLET | Freq: Two times a day (BID) | ORAL | 1 refills | Status: DC
Start: 2020-02-19 — End: 2020-06-26

## 2020-02-19 NOTE — Patient Instructions (Signed)
BP is at goal.  Continue current medications   Checking vitamin D today

## 2020-02-19 NOTE — Progress Notes (Addendum)
Subjective:  Patient ID: Kathy [name], female    DOB: [birth year]  Age: 59 y.o. MRN: [id number]  CC: The primary encounter diagnosis was Vitamin D deficiency. Diagnoses of Fatty liver, alcoholic, Colon cancer screening, Essential hypertension, Gastroesophageal reflux disease with esophagitis without hemorrhage, Alcoholic peripheral neuropathy ([place]), GAD (generalized anxiety disorder), History of alcohol dependence ([place]), and Hyponatremia were also pertinent to this visit.  HPI Kathy [name] presents for follow up on hypertension,  neuropathy and med refill   Taking alprazolam not more than once a week  For panic attacks. She has requested  No refills since September   taking tramadol and gabapentin for peripheral neuropathy.  Pain symptoms are aggravated by winter cold.  Works for [employer] in [place]. 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  Had respiratory symptoms that were severe,  Tested negative for COVID X 3 by Urgent care and home testing  x  2  . Treated with augmentin finally to  resolution by urgent care.     Outpatient Medications Prior to Visit  Medication Sig Dispense Refill  . cyanocobalamin (,VITAMIN B-12,) 1000 MCG/ML injection Inject 1 mL (1,000 mcg total) into the muscle every 14 (fourteen) days. 6 mL 3  . cyclobenzaprine (FLEXERIL) 10 MG tablet Take 1 tablet (10 mg total) by mouth 3 (three) times daily as needed for muscle spasms. Involving the legs and feet 90 tablet 1  . dicyclomine (BENTYL) 20 MG tablet Take 1 tablet (20 mg total) by mouth every 6 (six) hours as needed for spasms. 120 tablet 5  . DULoxetine HCl 40 MG CPEP Take 40 mg by mouth daily. 30 capsule 5  . gabapentin (NEURONTIN) 400 MG capsule Take 2 capsules (800 mg total) by mouth 3 (three) times daily. 180 capsule 5  . omeprazole (PRILOSEC) 40 MG capsule Take 1 capsule (40 mg total) by mouth 2 (two)  times daily. 180 capsule 1  . Syringe/Needle, Disp, (SYRINGE 3CC/25GX1") 25G X 1" 3 ML MISC Use for b12 injections 50 each 0  . traZODone (DESYREL) 100 MG tablet 1/2 to 1 tablet 30 to 60 minutes before bedtime 90 tablet 1  . ALPRAZolam (XANAX) 1 MG tablet TAKE 1 TABLET BY MOUTH AT BEDTIME AS NEEDED FOR ANXIETY OR SLEEP 20 tablet 0  . amLODipine (NORVASC) 5 MG tablet Take 1 tablet (5 mg total) by mouth 2 (two) times daily. 60 tablet 1  . traMADol (ULTRAM) 50 MG tablet TAKE ONE TABLET BY MOUTH EVERY 8 HOURS AS NEEDED FOR UP TO 5 DAYS 90 tablet 0  . metoprolol succinate (TOPROL-XL) 25 MG 24 hr tablet Take 1 tablet (25 mg total) by mouth at bedtime. (Patient not taking: Reported on 02/19/2020) 90 tablet 1   No facility-administered medications prior to visit.    Review of Systems;  Patient denies headache, fevers, malaise, unintentional weight loss, skin rash, eye pain, sinus congestion and sinus pain, sore throat, dysphagia,  hemoptysis , cough, dyspnea, wheezing, chest pain, palpitations, orthopnea, edema, abdominal pain, nausea, melena, diarrhea, constipation, flank pain, dysuria, hematuria, urinary  Frequency, nocturia, numbness, tingling, seizures,  Focal weakness, Loss of consciousness,  Tremor, insomnia, depression, anxiety, and suicidal ideation.      Objective:  BP 122/68 (BP Location: Left Arm, Patient Position: Sitting, Cuff Size: Normal)   Pulse 74   Temp 98.1 F (36.7 C) (Oral)  Resp 15   Ht 5\' 1"  (1.549 m)   Wt 101 lb 6.4 oz (46 kg)   SpO2 97%   BMI 19.16 kg/m   BP Readings from Last 3 Encounters:  02/19/20 122/68  11/09/19 (!) 150/95  09/29/19 (!) 148/84    Wt Readings from Last 3 Encounters:  02/19/20 101 lb 6.4 oz (46 kg)  11/09/19 98 lb 3.2 oz (44.5 kg)  09/29/19 98 lb 3.2 oz (44.5 kg)    General appearance: alert, cooperative and appears stated age Ears: normal TM's and external ear canals both ears Throat: lips, mucosa, and tongue normal; teeth and gums  normal Neck: no adenopathy, no carotid bruit, supple, symmetrical, trachea midline and thyroid not enlarged, symmetric, no tenderness/mass/nodules Back: symmetric, no curvature. ROM normal. No CVA tenderness. Lungs: clear to auscultation bilaterally Heart: regular rate and rhythm, S1, S2 normal, no murmur, click, rub or gallop Abdomen: soft, non-tender; bowel sounds normal; no masses,  no organomegaly Pulses: 2+ and symmetric Skin: Skin color, texture, turgor normal. No rashes or lesions Lymph nodes: Cervical, supraclavicular, and axillary nodes normal.  Lab Results  Component Value Date   HGBA1C 5.4 10/03/2018   HGBA1C 5.9 02/21/2018   HGBA1C 6.1 04/05/2017    Lab Results  Component Value Date   CREATININE 0.72 02/19/2020   CREATININE 0.63 06/21/2019   CREATININE 0.70 10/03/2018    Lab Results  Component Value Date   WBC 6.8 06/21/2019   HGB 14.0 06/21/2019   HCT 41.0 06/21/2019   PLT 188.0 06/21/2019   GLUCOSE 97 02/19/2020   CHOL 187 10/03/2018   TRIG 86 10/03/2018   HDL 95 10/03/2018   LDLDIRECT 119.0 10/11/2014   LDLCALC 75 10/03/2018   ALT 12 02/19/2020   AST 23 02/19/2020   NA 127 (L) 02/19/2020   K 4.7 02/19/2020   CL 93 (L) 02/19/2020   CREATININE 0.72 02/19/2020   BUN 17 02/19/2020   CO2 26 02/19/2020   TSH 1.290 08/11/2018   HGBA1C 5.4 10/03/2018    MM 3D SCREEN BREAST BILATERAL  Result Date: 02/28/2019 CLINICAL DATA:  Screening. EXAM: DIGITAL SCREENING BILATERAL MAMMOGRAM WITH TOMO AND CAD COMPARISON:  Previous exam(s). ACR Breast Density Category b: There are scattered areas of fibroglandular density. FINDINGS: There are no findings suspicious for malignancy. Images were processed with CAD. IMPRESSION: No mammographic evidence of malignancy. A result letter of this screening mammogram will be mailed directly to the patient. RECOMMENDATION: Screening mammogram in one year. (Code:SM-B-01Y) BI-RADS CATEGORY  1: Negative. Electronically Signed   By: Curlene Dolphin M.D.   On: 02/28/2019 14:52    Assessment & Plan:   Problem List Items Addressed This Visit      Unprioritized   Alcoholic peripheral neuropathy ([place])    Chronic and stable.  Continue Cymbalta, gabapentin, tramadol at current doses.  She is unable to work full time in any position that requires prolonged standing.       Relevant Medications   ALPRAZolam (XANAX) 1 MG tablet   Essential hypertension    Well controlled on current regimen of amlodipine 5 mg bid.  Without side effects.  no changes today.      Relevant Medications   amLODipine (NORVASC) 5 MG tablet   Fatty liver, alcoholic   Relevant Orders   Comprehensive metabolic panel (Completed)   GAD (generalized anxiety disorder)    Using cymbalta for management of anxiety and melatonin/trazodone  for insomnia with poor results.  Frequent wakenings with panic feelings.  Using alprazolam sparingly after Trial of buspirone failed.       Relevant Medications   ALPRAZolam (XANAX) 1 MG tablet   History of alcohol dependence (HCC)    She is approaching 5 years of sobriety      Hyponatremia    Etiology unclear.  Recurrent problem  Suggestive of recidivism . rtc one week       Relevant Orders   Basic metabolic panel    Other Visit Diagnoses    Vitamin D deficiency    -  Primary   Relevant Orders   VITAMIN D 25 Hydroxy (Vit-D Deficiency, Fractures) (Completed)   Colon cancer screening       Relevant Orders   Ambulatory referral to Gastroenterology   Gastroesophageal reflux disease with esophagitis without hemorrhage       Relevant Orders   Ambulatory referral to Gastroenterology      I have discontinued Koa K. Raybourn's metoprolol succinate. I am also having her maintain her dicyclomine, cyclobenzaprine, gabapentin, omeprazole, SYRINGE 3CC/25GX1", cyanocobalamin, traZODone, DULoxetine HCl, amLODipine, ALPRAZolam, and traMADol.  Meds ordered this encounter  Medications  . amLODipine (NORVASC) 5 MG tablet     Sig: Take 1 tablet (5 mg total) by mouth 2 (two) times daily.    Dispense:  180 tablet    Refill:  1  . ALPRAZolam (XANAX) 1 MG tablet    Sig: TAKE 1 TABLET BY MOUTH AT BEDTIME AS NEEDED FOR ANXIETY OR SLEEP    Dispense:  20 tablet    Refill:  1    Maximum 20 per month.  KEEP ON FILE FOR FUTURE REFILLS  . traMADol (ULTRAM) 50 MG tablet    Sig: TAKE ONE TABLET BY MOUTH EVERY 8 HOURS AS NEEDED FOR UP TO 5 DAYS    Dispense:  90 tablet    Refill:  5    Medications Discontinued During This Encounter  Medication Reason  . metoprolol succinate (TOPROL-XL) 25 MG 24 hr tablet   . amLODipine (NORVASC) 5 MG tablet Reorder  . ALPRAZolam (XANAX) 1 MG tablet Reorder  . traMADol (ULTRAM) 50 MG tablet Reorder    Follow-up: No follow-ups on file.   Sherlene Shams, MD

## 2020-02-20 LAB — COMPREHENSIVE METABOLIC PANEL
ALT: 12 U/L (ref 0–35)
AST: 23 U/L (ref 0–37)
Albumin: 4.4 g/dL (ref 3.5–5.2)
Alkaline Phosphatase: 66 U/L (ref 39–117)
BUN: 17 mg/dL (ref 6–23)
CO2: 26 mEq/L (ref 19–32)
Calcium: 9.2 mg/dL (ref 8.4–10.5)
Chloride: 93 mEq/L — ABNORMAL LOW (ref 96–112)
Creatinine, Ser: 0.72 mg/dL (ref 0.40–1.20)
GFR: 92.34 mL/min (ref >60.00)
Glucose, Bld: 97 mg/dL (ref 70–99)
Potassium: 4.7 mEq/L (ref 3.5–5.1)
Sodium: 127 mEq/L — ABNORMAL LOW (ref 135–145)
Total Bilirubin: 0.5 mg/dL (ref 0.2–1.2)
Total Protein: 7.4 g/dL (ref 6.0–8.3)

## 2020-02-20 LAB — VITAMIN D 25 HYDROXY (VIT D DEFICIENCY, FRACTURES): VITD: 86.24 ng/mL (ref 30.00–100.00)

## 2020-02-20 NOTE — Assessment & Plan Note (Signed)
Chronic and stable.  Continue Cymbalta, gabapentin, tramadol at current doses.  She is unable to work full time in any position that requires prolonged standing.  

## 2020-02-20 NOTE — Assessment & Plan Note (Signed)
She is approaching 5 years of sobriety

## 2020-02-20 NOTE — Assessment & Plan Note (Signed)
Etiology unclear.  Recurrent problem  Suggestive of recidivism . rtc one week

## 2020-02-20 NOTE — Assessment & Plan Note (Signed)
Using cymbalta for management of anxiety and melatonin/trazodone  for insomnia with poor results.  Frequent wakenings with panic feelings.  Using alprazolam sparingly after Trial of buspirone failed.

## 2020-02-20 NOTE — Assessment & Plan Note (Signed)
Well controlled on current regimen of amlodipine 5 mg bid.  Without side effects.  no changes today. 

## 2020-02-20 NOTE — Addendum Note (Signed)
Addended by: Sherlene Shams on: 02/20/2020 09:20 PM   Modules accepted: Orders

## 2020-03-01 ENCOUNTER — Other Ambulatory Visit: Payer: Self-pay

## 2020-03-01 ENCOUNTER — Ambulatory Visit
Admission: RE | Admit: 2020-03-01 | Discharge: 2020-03-01 | Disposition: A | Discharge status: Home / Self Care | Payer: PPO | Source: Ambulatory Visit | Attending: Internal Medicine | Admitting: Internal Medicine

## 2020-03-01 DIAGNOSIS — Z1231 Encounter for screening mammogram for malignant neoplasm of breast: Secondary | ICD-10-CM | POA: Diagnosis not present

## 2020-03-07 ENCOUNTER — Ambulatory Visit (INDEPENDENT_AMBULATORY_CARE_PROVIDER_SITE_OTHER): Payer: PPO

## 2020-03-07 ENCOUNTER — Other Ambulatory Visit: Payer: Self-pay

## 2020-03-07 DIAGNOSIS — E871 Hypo-osmolality and hyponatremia: Secondary | ICD-10-CM

## 2020-03-07 DIAGNOSIS — E538 Deficiency of other specified B group vitamins: Secondary | ICD-10-CM

## 2020-03-07 LAB — BASIC METABOLIC PANEL
BUN: 9 mg/dL (ref 6–23)
CO2: 28 mEq/L (ref 19–32)
Calcium: 8.8 mg/dL (ref 8.4–10.5)
Chloride: 97 mEq/L (ref 96–112)
Creatinine, Ser: 0.57 mg/dL (ref 0.40–1.20)
GFR: 100.33 mL/min (ref >60.00)
Glucose, Bld: 148 mg/dL — ABNORMAL HIGH (ref 70–99)
Potassium: 4 mEq/L (ref 3.5–5.1)
Sodium: 130 mEq/L — ABNORMAL LOW (ref 135–145)

## 2020-03-07 MED ORDER — CYANOCOBALAMIN 1000 MCG/ML IJ SOLN
1000.0000 ug | Freq: Once | INTRAMUSCULAR | Status: AC
  Administered 2020-03-07: 1000 ug via INTRAMUSCULAR

## 2020-03-07 NOTE — Progress Notes (Signed)
Patient presented for B 12 injection to left deltoid, patient voiced no concerns nor showed any signs of distress during injection. 

## 2020-03-10 ENCOUNTER — Telehealth: Payer: Self-pay | Admitting: Internal Medicine

## 2020-03-10 NOTE — Telephone Encounter (Signed)
MyChart message sent : labs stable

## 2020-03-28 ENCOUNTER — Ambulatory Visit (INDEPENDENT_AMBULATORY_CARE_PROVIDER_SITE_OTHER): Payer: PPO

## 2020-03-28 ENCOUNTER — Other Ambulatory Visit: Payer: Self-pay

## 2020-03-28 DIAGNOSIS — E538 Deficiency of other specified B group vitamins: Secondary | ICD-10-CM | POA: Diagnosis not present

## 2020-03-28 MED ORDER — CYANOCOBALAMIN 1000 MCG/ML IJ SOLN
1000.0000 ug | Freq: Once | INTRAMUSCULAR | Status: AC
  Administered 2020-03-28: 1000 ug via INTRAMUSCULAR

## 2020-03-28 NOTE — Progress Notes (Signed)
Patient presented for B 12 injection to left deltoid, patient voiced no concerns nor showed any signs of distress during injection. 

## 2020-04-08 ENCOUNTER — Telehealth: Payer: Self-pay | Admitting: Internal Medicine

## 2020-04-08 NOTE — Telephone Encounter (Signed)
Left message for patient to call back and schedule Medicare Annual Wellness Visit (AWV)   This should be a virtual visit only=30 minutes.  WELCOME EXAM WAS 03/24/19; please schedule at anytime with Denisa O'Brien-Blaney at [facility]

## 2020-04-10 DIAGNOSIS — R350 Frequency of micturition: Secondary | ICD-10-CM | POA: Diagnosis not present

## 2020-04-10 DIAGNOSIS — N3001 Acute cystitis with hematuria: Secondary | ICD-10-CM | POA: Diagnosis not present

## 2020-04-10 DIAGNOSIS — R309 Painful micturition, unspecified: Secondary | ICD-10-CM | POA: Diagnosis not present

## 2020-04-12 ENCOUNTER — Other Ambulatory Visit: Payer: Self-pay | Admitting: Internal Medicine

## 2020-04-12 NOTE — Telephone Encounter (Signed)
Last OV 02/19/20 ok to fill?

## 2020-04-18 DIAGNOSIS — L720 Epidermal cyst: Secondary | ICD-10-CM | POA: Diagnosis not present

## 2020-04-18 DIAGNOSIS — D2272 Melanocytic nevi of left lower limb, including hip: Secondary | ICD-10-CM | POA: Diagnosis not present

## 2020-04-18 DIAGNOSIS — L578 Other skin changes due to chronic exposure to nonionizing radiation: Secondary | ICD-10-CM | POA: Diagnosis not present

## 2020-04-18 DIAGNOSIS — D485 Neoplasm of uncertain behavior of skin: Secondary | ICD-10-CM | POA: Diagnosis not present

## 2020-04-18 DIAGNOSIS — D2271 Melanocytic nevi of right lower limb, including hip: Secondary | ICD-10-CM | POA: Diagnosis not present

## 2020-04-18 DIAGNOSIS — D2261 Melanocytic nevi of right upper limb, including shoulder: Secondary | ICD-10-CM | POA: Diagnosis not present

## 2020-04-18 DIAGNOSIS — D2262 Melanocytic nevi of left upper limb, including shoulder: Secondary | ICD-10-CM | POA: Diagnosis not present

## 2020-04-18 DIAGNOSIS — D225 Melanocytic nevi of trunk: Secondary | ICD-10-CM | POA: Diagnosis not present

## 2020-04-18 DIAGNOSIS — X32XXXA Exposure to sunlight, initial encounter: Secondary | ICD-10-CM | POA: Diagnosis not present

## 2020-04-18 DIAGNOSIS — L821 Other seborrheic keratosis: Secondary | ICD-10-CM | POA: Diagnosis not present

## 2020-04-19 ENCOUNTER — Ambulatory Visit (INDEPENDENT_AMBULATORY_CARE_PROVIDER_SITE_OTHER): Payer: PPO

## 2020-04-19 VITALS — Ht 61.0 in | Wt 101.0 lb

## 2020-04-19 DIAGNOSIS — Z0000 Encounter for general adult medical examination without abnormal findings: Secondary | ICD-10-CM | POA: Diagnosis not present

## 2020-04-19 NOTE — Progress Notes (Cosign Needed Addendum)
Subjective:   Kathy [name] is a 60 y.o. female who presents for an Initial Medicare Annual Wellness Visit.  Review of Systems    No ROS.  Medicare Wellness Virtual Visit.   Cardiac Risk Factors include: advanced age (>30men, >45 women)     Objective:    Today's Vitals   04/19/20 1136  Weight: 101 lb (45.8 kg)  Height: 5\' 1"  (1.549 m)   Body mass index is 19.08 kg/m.  Advanced Directives 04/19/2020 09/10/2015 09/02/2015 08/24/2015 05/23/2015 05/09/2015 12/29/2014  Does Patient Have a Medical Advance Directive? No No No No No No No  Would patient like information on creating a medical advance directive? No - Patient declined No - patient declined information - - - No - patient declined information -    Current Medications (verified) Outpatient Encounter Medications as of 04/19/2020  Medication Sig   ALPRAZolam (XANAX) 1 MG tablet TAKE 1 TABLET BY MOUTH AT BEDTIME AS NEEDED FOR ANXIETY OR SLEEP. MAXIMUM 20 TABLETS PER MONTH.   amLODipine (NORVASC) 5 MG tablet Take 1 tablet (5 mg total) by mouth 2 (two) times daily.   cyanocobalamin (,VITAMIN B-12,) 1000 MCG/ML injection Inject 1 mL (1,000 mcg total) into the muscle every 14 (fourteen) days.   cyclobenzaprine (FLEXERIL) 10 MG tablet Take 1 tablet (10 mg total) by mouth 3 (three) times daily as needed for muscle spasms. Involving the legs and feet   dicyclomine (BENTYL) 20 MG tablet Take 1 tablet (20 mg total) by mouth every 6 (six) hours as needed for spasms.   DULoxetine HCl 40 MG CPEP Take 40 mg by mouth daily.   gabapentin (NEURONTIN) 400 MG capsule Take 2 capsules (800 mg total) by mouth 3 (three) times daily.   omeprazole (PRILOSEC) 40 MG capsule Take 1 capsule (40 mg total) by mouth 2 (two) times daily.   Syringe/Needle, Disp, (SYRINGE 3CC/25GX1") 25G X 1" 3 ML MISC Use for b12 injections   traMADol (ULTRAM) 50 MG tablet TAKE ONE TABLET BY MOUTH EVERY 8 HOURS AS NEEDED FOR UP TO 5 DAYS   traZODone (DESYREL) 100 MG tablet 1/2  to 1 tablet 30 to 60 minutes before bedtime   No facility-administered encounter medications on file as of 04/19/2020.    Allergies (verified) Lyrica [pregabalin]   History: Past Medical History:  Diagnosis Date   Alcohol abuse    Chicken pox    Colon polyps    Depression    Hernia of abdominal cavity    Hip fracture (HCC) right   Neuropathy    History reviewed. No pertinent surgical history. Family History  Problem Relation Age of Onset   Hypertension Mother    Osteoporosis Mother    Meniere's disease Mother    Cancer Father 45       prostate   Colon cancer Paternal Grandmother    Alcohol abuse Other    Social History   Socioeconomic History   Marital status: Divorced    Spouse name: Not on file   Number of children: Not on file   Years of education: Not on file   Highest education level: Not on file  Occupational History   Not on file  Tobacco Use   Smoking status: Current Every Day Smoker    Packs/day: 0.75    Years: 40.00    Pack years: 30.00    Types: Cigarettes   Smokeless tobacco: Never Used   Tobacco comment: smokes one pack/week currently  Substance and Sexual Activity   Alcohol  use: No    Alcohol/week: 0.0 standard drinks    Comment: abstinent since october 2017   Drug use: No   Sexual activity: Not Currently  Other Topics Concern   Not on file  Social History Narrative   Not on file   Social Determinants of Health   Financial Resource Strain: Low Risk    Difficulty of Paying Living Expenses: Not hard at all  Food Insecurity: No Food Insecurity   Worried About Charity fundraiser in the Last Year: Never true   [place] in the Last Year: Never true  Transportation Needs: No Transportation Needs   Lack of Transportation (Medical): No   Lack of Transportation (Non-Medical): No  Physical Activity: Insufficiently Active   Days of Exercise per Week: 3 days   Minutes of Exercise per Session: 30 min  Stress: No Stress Concern Present    Feeling of Stress : Only a little  Social Connections: Unknown   Frequency of Communication with Friends and Family: More than three times a week   Frequency of Social Gatherings with Friends and Family: More than three times a week   Attends Religious Services: 1 to 4 times per year   Active Member of [employer] or Organizations: Not on file   Attends Archivist Meetings: Not on file   Marital Status: Not on file    Tobacco Counseling Ready to quit: Not Answered Counseling given: Not Answered Comment: smokes one pack/week currently   Clinical Intake:  Pre-visit preparation completed: Yes        Diabetes: No  How often do you need to have someone help you when you read instructions, pamphlets, or other written materials from your doctor or pharmacy?: 1 - Never    Interpreter Needed?: No      Activities of Daily Living In your present state of health, do you have any difficulty performing the following activities: 04/19/2020  Hearing? N  Vision? N  Difficulty concentrating or making decisions? N  Walking or climbing stairs? Y  Comment Paces self  Dressing or bathing? N  Doing errands, shopping? N  Preparing Food and eating ? N  Using the Toilet? N  In the past six months, have you accidently leaked urine? N  Do you have problems with loss of bowel control? N  Managing your Medications? N  Managing your Finances? N  Housekeeping or managing your Housekeeping? N  Some recent data might be hidden    Patient Care Team: Crecencio Mc, MD as PCP - General (Internal Medicine)  Indicate any recent Medical Services you may have received from other than [facility] providers in the past year (date may be approximate).     Assessment:   This is a routine wellness examination for Kathy [name].  I connected with Kathy [name] today by telephone and verified that I am speaking with the correct person using two identifiers. Location patient: home Location provider: work Persons  participating in the virtual visit: patient, Marine scientist.    I discussed the limitations, risks, security and privacy concerns of performing an evaluation and management service by telephone and the availability of in person appointments. The patient expressed understanding and verbally consented to this telephonic visit.    Interactive audio and video telecommunications were attempted between this provider and patient, however failed, due to patient having technical difficulties OR patient did not have access to video capability.  We continued and completed visit with audio only.  Some vital signs may be  absent or patient reported.   Hearing/Vision screen  Hearing Screening   125Hz  250Hz  500Hz  1000Hz  2000Hz  3000Hz  4000Hz  6000Hz  8000Hz   Right ear:           Left ear:           Comments: Patient is able to hear conversational tones without difficulty.  No issues reported.  Vision Screening Comments: Wears corrective lenses Visual acuity not assessed, virtual visit.  They have seen their ophthalmologist in the last 12 months.   Dietary issues and exercise activities discussed: Current Exercise Habits: Home exercise routine, Type of exercise: calisthenics, Time (Minutes): 30, Frequency (Times/Week): 3, Weekly Exercise (Minutes/Week): 90, Intensity: Mild  Healthy diet Good water intake  Goals      I want to keep my stress level down     Do things I enjoy and new things that bring joy        Depression Screen PHQ 2/9 Scores 04/19/2020 02/19/2020 09/29/2019 06/21/2019 03/24/2019 09/19/2018 05/16/2018  PHQ - 2 Score 1 1 1 1 1 3 1   PHQ- 9 Score 2 2 5 3 1 5 3     Fall Risk Fall Risk  04/19/2020 02/19/2020 11/09/2019 09/29/2019 06/21/2019  Falls in the past year? 0 0 0 0 0  Number falls in past yr: 0 - - - -  Injury with Fall? - - - - -  Risk for fall due to : - - - - -  Follow up Falls evaluation completed Falls evaluation completed Falls evaluation completed Falls evaluation completed Falls  evaluation completed    [place]: Handrails in use when climbing stairs? Yes Home free of loose throw rugs in walkways, pet beds, electrical cords, etc? Yes  Adequate lighting in your home to reduce risk of falls? Yes   ASSISTIVE DEVICES UTILIZED TO PREVENT FALLS: Life alert? No  Use of a cane, walker or w/c? No  Grab bars in the bathroom? Yes   TIMED UP AND GO: Was the test performed? No . Virtual visit.   Cognitive Function:  Patient is alert and oriented x3. Denies difficulty with focusing, making decisions, memory loss.  Enjoys working and exercising.  MMSE/6CIT deferred. Normal by direct communication/observation.        Immunizations Immunization History  Administered Date(s) Administered   Hep A / Hep B 10/11/2014, 11/14/2014, 04/22/2015   Influenza-Unspecified 11/24/2018, 12/08/2019   PFIZER(Purple Top)SARS-COV-2 Vaccination 12/04/2019, 12/25/2019   PPD Test 09/16/2015   Health Maintenance Health Maintenance  Topic Date Due   TETANUS/TDAP  Never done   COLONOSCOPY (Pts 45-39yrs Insurance coverage will need to be confirmed)  01/31/2018   PAP SMEAR-Modifier  10/15/2019   COVID-19 Vaccine (3 - Booster for Pfizer series) 06/23/2020   MAMMOGRAM  03/01/2022   INFLUENZA VACCINE  Completed   Hepatitis C Screening  Completed   HIV Screening  Completed   Colonoscopy- scheduled.   Pap Smear- plans to complete later in the year with pcp.   Tdap- check insurance for coverage. Deferred for follow up with local pharmacy or health department.   Vision Screening: Recommended annual ophthalmology exams for early detection of glaucoma and other disorders of the eye. Is the patient up to date with their annual eye exam?  Yes  Who is the provider or what is the name of the office in which the patient attends annual eye exams? [facility].  Dental Screening: Recommended annual dental exams for proper oral hygiene.  Community Resource  Referral /  Chronic Care Management: CRR required this visit?  No   CCM required this visit?  No      Plan:   Keep all routine maintenance appointments.   I have personally reviewed and noted the following in the patient's chart:   Medical and social history Use of alcohol, tobacco or illicit drugs  Current medications and supplements Functional ability and status Nutritional status Physical activity Advanced directives List of other physicians Hospitalizations, surgeries, and ER visits in previous 12 months Vitals Screenings to include cognitive, depression, and falls Referrals and appointments  In addition, I have reviewed and discussed with patient certain preventive protocols, quality metrics, and best practice recommendations. A written personalized care plan for preventive services as well as general preventive health recommendations were provided to patient via mychart.     OBrien-Blaney, Jazmina Muhlenkamp L, LPN   07/27/7996     I have reviewed the above information and agree with above.   Deborra Medina, MD

## 2020-04-19 NOTE — Patient Instructions (Addendum)
Kathy [name] , Thank you for taking time to come for your Medicare Wellness Visit. I appreciate your ongoing commitment to your health goals. Please review the following plan we discussed and let me know if I can assist you in the future.   These are the goals we discussed: Goals    . I want to keep my stress level down     Do things I enjoy and new things that bring joy        This is a list of the screening recommended for you and due dates:  Health Maintenance  Topic Date Due  . Tetanus Vaccine  Never done  . Colon Cancer Screening  01/31/2018  . Pap Smear  10/15/2019  . COVID-19 Vaccine (3 - Booster for Pfizer series) 06/23/2020  . Mammogram  03/01/2022  . Flu Shot  Completed  .  Hepatitis C: One time screening is recommended by Center for Disease Control  (CDC) for  adults born from [birth year] through 1964.   Completed  . HIV Screening  Completed    Immunizations Immunization History  Administered Date(s) Administered  . Hep A / Hep B 10/11/2014, 11/14/2014, 04/22/2015  . Influenza-Unspecified 11/24/2018, 12/08/2019  . PFIZER(Purple Top)SARS-COV-2 Vaccination 12/04/2019, 12/25/2019  . PPD Test 09/16/2015   Advanced directives: not yet completed. Plans to complete with the senior center.   Conditions/risks identified: none new.  Follow up in one year for your annual wellness visit.   Preventive Care 40-64 Years, Female Preventive care refers to lifestyle choices and visits with your health care provider that can promote health and wellness. What does preventive care include?  A yearly physical exam. This is also called an annual well check.  Dental exams once or twice a year.  Routine eye exams. Ask your health care provider how often you should have your eyes checked.  Personal lifestyle choices, including:  Daily care of your teeth and gums.  Regular physical activity.  Eating a healthy diet.  Avoiding tobacco and drug use.  Limiting alcohol  use.  Practicing safe sex.  Taking low-dose aspirin daily starting at age 48.  Taking vitamin and mineral supplements as recommended by your health care provider. What happens during an annual well check? The services and screenings done by your health care provider during your annual well check will depend on your age, overall health, lifestyle risk factors, and family history of disease. Counseling  Your health care provider may ask you questions about your:  Alcohol use.  Tobacco use.  Drug use.  Emotional well-being.  Home and relationship well-being.  Sexual activity.  Eating habits.  Work and work Statistician.  Method of birth control.  Menstrual cycle.  Pregnancy history. Screening  You may have the following tests or measurements:  Height, weight, and BMI.  Blood pressure.  Lipid and cholesterol levels. These may be checked every 5 years, or more frequently if you are over 36 years old.  Skin check.  Lung cancer screening. You may have this screening every year starting at age 13 if you have a 30-pack-year history of smoking and currently smoke or have quit within the past 15 years.  Fecal occult blood test (FOBT) of the stool. You may have this test every year starting at age 56.  Flexible sigmoidoscopy or colonoscopy. You may have a sigmoidoscopy every 5 years or a colonoscopy every 10 years starting at age 66.  Hepatitis C blood test.  Hepatitis B blood test.  Sexually transmitted disease (STD)  testing.  Diabetes screening. This is done by checking your blood sugar (glucose) after you have not eaten for a while (fasting). You may have this done every 1-3 years.  Mammogram. This may be done every 1-2 years. Talk to your health care provider about when you should start having regular mammograms. This may depend on whether you have a family history of breast cancer.  BRCA-related cancer screening. This may be done if you have a family history of  breast, ovarian, tubal, or peritoneal cancers.  Pelvic exam and Pap test. This may be done every 3 years starting at age 66. Starting at age 43, this may be done every 5 years if you have a Pap test in combination with an HPV test.  Bone density scan. This is done to screen for osteoporosis. You may have this scan if you are at high risk for osteoporosis. Discuss your test results, treatment options, and if necessary, the need for more tests with your health care provider. Vaccines  Your health care provider may recommend certain vaccines, such as:  Influenza vaccine. This is recommended every year.  Tetanus, diphtheria, and acellular pertussis (Tdap, Td) vaccine. You may need a Td booster every 10 years.  Zoster vaccine. You may need this after age 13.  Pneumococcal 13-valent conjugate (PCV13) vaccine. You may need this if you have certain conditions and were not previously vaccinated.  Pneumococcal polysaccharide (PPSV23) vaccine. You may need one or two doses if you smoke cigarettes or if you have certain conditions. Talk to your health care provider about which screenings and vaccines you need and how often you need them. This information is not intended to replace advice given to you by your health care provider. Make sure you discuss any questions you have with your health care provider. Document Released: 03/08/2015 Document Revised: 10/30/2015 Document Reviewed: 12/11/2014 Elsevier Interactive Patient Education  2017 [place] Prevention in the Home Falls can cause injuries. They can happen to people of all ages. There are many things you can do to make your home safe and to help prevent falls. What can I do on the outside of my home?  Regularly fix the edges of walkways and driveways and fix any cracks.  Remove anything that might make you trip as you walk through a door, such as a raised step or threshold.  Trim any bushes or trees on the path to your  home.  Use bright outdoor lighting.  Clear any walking paths of anything that might make someone trip, such as rocks or tools.  Regularly check to see if handrails are loose or broken. Make sure that both sides of any steps have handrails.  Any raised decks and porches should have guardrails on the edges.  Have any leaves, snow, or ice cleared regularly.  Use sand or salt on walking paths during winter.  Clean up any spills in your garage right away. This includes oil or grease spills. What can I do in the bathroom?  Use night lights.  Install grab bars by the toilet and in the tub and shower. Do not use towel bars as grab bars.  Use non-skid mats or decals in the tub or shower.  If you need to sit down in the shower, use a plastic, non-slip stool.  Keep the floor dry. Clean up any water that spills on the floor as soon as it happens.  Remove soap buildup in the tub or shower regularly.  Attach bath  mats securely with double-sided non-slip rug tape.  Do not have throw rugs and other things on the floor that can make you trip. What can I do in the bedroom?  Use night lights.  Make sure that you have a light by your bed that is easy to reach.  Do not use any sheets or blankets that are too big for your bed. They should not hang down onto the floor.  Have a firm chair that has side arms. You can use this for support while you get dressed.  Do not have throw rugs and other things on the floor that can make you trip. What can I do in the kitchen?  Clean up any spills right away.  Avoid walking on wet floors.  Keep items that you use a lot in easy-to-reach places.  If you need to reach something above you, use a strong step stool that has a grab bar.  Keep electrical cords out of the way.  Do not use floor polish or wax that makes floors slippery. If you must use wax, use non-skid floor wax.  Do not have throw rugs and other things on the floor that can make you  trip. What can I do with my stairs?  Do not leave any items on the stairs.  Make sure that there are handrails on both sides of the stairs and use them. Fix handrails that are broken or loose. Make sure that handrails are as long as the stairways.  Check any carpeting to make sure that it is firmly attached to the stairs. Fix any carpet that is loose or worn.  Avoid having throw rugs at the top or bottom of the stairs. If you do have throw rugs, attach them to the floor with carpet tape.  Make sure that you have a light switch at the top of the stairs and the bottom of the stairs. If you do not have them, ask someone to add them for you. What else can I do to help prevent falls?  Wear shoes that:  Do not have high heels.  Have rubber bottoms.  Are comfortable and fit you well.  Are closed at the toe. Do not wear sandals.  If you use a stepladder:  Make sure that it is fully opened. Do not climb a closed stepladder.  Make sure that both sides of the stepladder are locked into place.  Ask someone to hold it for you, if possible.  Clearly mark and make sure that you can see:  Any grab bars or handrails.  First and last steps.  Where the edge of each step is.  Use tools that help you move around (mobility aids) if they are needed. These include:  Canes.  Walkers.  Scooters.  Crutches.  Turn on the lights when you go into a dark area. Replace any light bulbs as soon as they burn out.  Set up your furniture so you have a clear path. Avoid moving your furniture around.  If any of your floors are uneven, fix them.  If there are any pets around you, be aware of where they are.  Review your medicines with your doctor. Some medicines can make you feel dizzy. This can increase your chance of falling. Ask your doctor what other things that you can do to help prevent falls. This information is not intended to replace advice given to you by your health care provider. Make  sure you discuss any questions you have with your  health care provider. Document Released: 12/06/2008 Document Revised: 07/18/2015 Document Reviewed: 03/16/2014 Elsevier Interactive Patient Education  2017 [employer].

## 2020-04-26 ENCOUNTER — Ambulatory Visit (INDEPENDENT_AMBULATORY_CARE_PROVIDER_SITE_OTHER): Payer: PPO

## 2020-04-26 ENCOUNTER — Other Ambulatory Visit: Payer: Self-pay

## 2020-04-26 DIAGNOSIS — E538 Deficiency of other specified B group vitamins: Secondary | ICD-10-CM | POA: Diagnosis not present

## 2020-04-26 MED ORDER — CYANOCOBALAMIN 1000 MCG/ML IJ SOLN
1000.0000 ug | Freq: Once | INTRAMUSCULAR | Status: AC
  Administered 2020-04-26: 1000 ug via INTRAMUSCULAR

## 2020-04-26 NOTE — Progress Notes (Signed)
Patient presented for B 12 injection to left deltoid, patient voiced no concerns nor showed any signs of distress during injection. 

## 2020-05-14 ENCOUNTER — Telehealth: Payer: Self-pay | Admitting: Internal Medicine

## 2020-05-14 NOTE — Telephone Encounter (Signed)
Unable to leave message for patient to return call back. Phone kept ringing.

## 2020-05-14 NOTE — Telephone Encounter (Signed)
Pt  Was seen at [facility] last week for a chest cold she is wanting to have a chest xray done at the hospital

## 2020-05-14 NOTE — Telephone Encounter (Signed)
Can you triage please

## 2020-05-15 ENCOUNTER — Other Ambulatory Visit: Payer: Self-pay

## 2020-05-15 NOTE — Telephone Encounter (Signed)
Left message to call office patient has an appointment scheduled for 05/20/20

## 2020-05-20 ENCOUNTER — Encounter: Payer: Self-pay | Admitting: Internal Medicine

## 2020-05-20 ENCOUNTER — Ambulatory Visit (INDEPENDENT_AMBULATORY_CARE_PROVIDER_SITE_OTHER): Payer: PPO | Admitting: Internal Medicine

## 2020-05-20 ENCOUNTER — Telehealth: Payer: PPO | Admitting: Internal Medicine

## 2020-05-20 ENCOUNTER — Other Ambulatory Visit: Payer: Self-pay

## 2020-05-20 ENCOUNTER — Ambulatory Visit: Payer: PPO

## 2020-05-20 ENCOUNTER — Ambulatory Visit (INDEPENDENT_AMBULATORY_CARE_PROVIDER_SITE_OTHER): Payer: PPO

## 2020-05-20 VITALS — BP 140/76 | HR 109 | Temp 98.2°F | Ht 61.0 in | Wt 104.8 lb

## 2020-05-20 DIAGNOSIS — G621 Alcoholic polyneuropathy: Secondary | ICD-10-CM | POA: Diagnosis not present

## 2020-05-20 DIAGNOSIS — R300 Dysuria: Secondary | ICD-10-CM | POA: Diagnosis not present

## 2020-05-20 DIAGNOSIS — E871 Hypo-osmolality and hyponatremia: Secondary | ICD-10-CM | POA: Diagnosis not present

## 2020-05-20 DIAGNOSIS — K5909 Other constipation: Secondary | ICD-10-CM | POA: Diagnosis not present

## 2020-05-20 DIAGNOSIS — E538 Deficiency of other specified B group vitamins: Secondary | ICD-10-CM | POA: Diagnosis not present

## 2020-05-20 DIAGNOSIS — F409 Phobic anxiety disorder, unspecified: Secondary | ICD-10-CM

## 2020-05-20 DIAGNOSIS — E441 Mild protein-calorie malnutrition: Secondary | ICD-10-CM | POA: Diagnosis not present

## 2020-05-20 DIAGNOSIS — K5903 Drug induced constipation: Secondary | ICD-10-CM

## 2020-05-20 DIAGNOSIS — F5105 Insomnia due to other mental disorder: Secondary | ICD-10-CM

## 2020-05-20 DIAGNOSIS — F1021 Alcohol dependence, in remission: Secondary | ICD-10-CM | POA: Diagnosis not present

## 2020-05-20 DIAGNOSIS — K5900 Constipation, unspecified: Secondary | ICD-10-CM | POA: Diagnosis not present

## 2020-05-20 DIAGNOSIS — F330 Major depressive disorder, recurrent, mild: Secondary | ICD-10-CM

## 2020-05-20 DIAGNOSIS — R109 Unspecified abdominal pain: Secondary | ICD-10-CM | POA: Diagnosis not present

## 2020-05-20 MED ORDER — TRAMADOL HCL 50 MG PO TABS: ORAL_TABLET | ORAL | 5 refills | Status: DC

## 2020-05-20 MED ORDER — TRAZODONE HCL 100 MG PO TABS: ORAL_TABLET | ORAL | 1 refills | Status: DC

## 2020-05-20 MED ORDER — CYANOCOBALAMIN 1000 MCG/ML IJ SOLN
1000.0000 ug | INTRAMUSCULAR | Status: DC
  Administered 2020-05-20: 1000 ug via INTRAMUSCULAR

## 2020-05-20 MED ORDER — PREDNISONE 10 MG PO TABS: ORAL_TABLET | ORAL | 0 refills | Status: DC

## 2020-05-20 NOTE — Patient Instructions (Addendum)
We are repeating your urinalysis today and checking labs and abdominal x ray     There are 4 categories of laxatives.  They can be combined,  But some should not be used daily .  Bulk  forming laxatives   Ok to use daily!  Citrucel, benefiber, metamucil, Fibercon, miralax)  Stool softener (docusate ; there's only one) :  ok to use daily    Stimulant laxatives (Ex Lax,  Correctol,  Senna,  Dulcolax)  :  avoid on a daily basis, not more than 2/week  Cathartic laxatives ( MOM,  Mag citrate, ) can be used  For a clean out,  But  not more than 1 /week    Ok to take plain  magnesium in capsule form   With or without the stool softener daily   Too much plain water (without electrolytes) will lower your sodiu For your allergies:  For your allergies ,  You can use Benadryl but you should also consider adding one of these newer second generation antihistamines that are longer acting, non sedating and  available OTC:  Generic  Zyrtec, which is cetirizine.    generic Allegra , available generically as fexofenadine ; comes in 60 mg and 180 mg once daily strengths.    Generic Claritin :  also available as loratidine .   Prednisone taper for the left ear Eustachian tube issues .  If the problem returns,  We will add Flonase    Use trazodone and melatonin for sleep Melatonin is not a sedative,  But must be taken on  a regular basis to help your internal clock.  Take every evening after dinner start with 3 mg dose   Max effective dose is 6 mg  You can also try using " Headspace".  This  is a phone app that teaches you to meditate and empty your mind of thoughts that interfere with space   .

## 2020-05-20 NOTE — Progress Notes (Addendum)
Subjective:  Patient ID: Kathy [name], female    DOB: 1961-02-02  Age: 60 y.o. MRN: [id number]  CC: The primary encounter diagnosis was B12 deficiency. Diagnoses of Dysuria, Mild malnutrition ([place]), Mild episode of recurrent major depressive disorder ([place]), Alcoholic peripheral neuropathy ([place]), History of alcohol dependence ([place]), Other constipation, Hyponatremia, Insomnia due to anxiety and fear, and Drug-induced constipation were also pertinent to this visit.  HPI Kathy [name] presents for follow up on multiple issues   This visit occurred during the SARS-CoV-2 public health emergency.  Safety protocols were in place, including screening questions prior to the visit, additional usage of staff PPE, and extensive cleaning of exam room while observing appropriate contact time as indicated for disinfecting solutions.     1) recurrent UTI symptoms:  [facility]:  Started,  Stopped,  Then restarted macrobid for negative urine with recurrence of symptoms.  Taking azo currently  2) B12 deficiency: getting monthly injections  Last one march 4   3) GAD:  Stressed out last month by work , developed insomnia. Used a few half tablets of  tranzene from a friend ,not nightly.  Because she had thrown out her alprazolam .  Developed symptoms of constipation and pubic cramping .    4) constipation:  Has colonoscopy on April  11.  Taking an OTC mild laxative  (dulcolax) . Bowels moving once daily  Small pebbles.   4) peripheral neuropathy:  Reduced tramadol   5)mid abdominal pain started 3 weeks ago during a viral syndrome negative for cough.  covid negative.  Cough has resolved as of Wednesday of last week.  Smoking   Neither pain is consistent (ballder or abdominal)   drinkig excessive amoutns of water (9 bottles of 16 oz  Daily) plus liquid IV   Outpatient Medications Prior to Visit  Medication Sig Dispense Refill  . amLODipine (NORVASC) 5 MG tablet Take 1 tablet  (5 mg total) by mouth 2 (two) times daily. 180 tablet 1  . cyanocobalamin (,VITAMIN B-12,) 1000 MCG/ML injection Inject 1 mL (1,000 mcg total) into the muscle every 14 (fourteen) days. 6 mL 3  . cyclobenzaprine (FLEXERIL) 10 MG tablet Take 1 tablet (10 mg total) by mouth 3 (three) times daily as needed for muscle spasms. Involving the legs and feet 90 tablet 1  . dicyclomine (BENTYL) 20 MG tablet Take 1 tablet (20 mg total) by mouth every 6 (six) hours as needed for spasms. 120 tablet 5  . DULoxetine HCl 40 MG CPEP Take 40 mg by mouth daily. 30 capsule 5  . gabapentin (NEURONTIN) 400 MG capsule Take 2 capsules (800 mg total) by mouth 3 (three) times daily. 180 capsule 5  . omeprazole (PRILOSEC) 40 MG capsule Take 1 capsule (40 mg total) by mouth 2 (two) times daily. 180 capsule 1  . Syringe/Needle, Disp, (SYRINGE 3CC/25GX1") 25G X 1" 3 ML MISC Use for b12 injections 50 each 0  . ALPRAZolam (XANAX) 1 MG tablet TAKE 1 TABLET BY MOUTH AT BEDTIME AS NEEDED FOR ANXIETY OR SLEEP. MAXIMUM 20 TABLETS PER MONTH. 20 tablet 2  . traMADol (ULTRAM) 50 MG tablet TAKE ONE TABLET BY MOUTH EVERY 8 HOURS AS NEEDED FOR UP TO 5 DAYS 90 tablet 5  . traZODone (DESYREL) 100 MG tablet 1/2 to 1 tablet 30 to 60 minutes before bedtime 90 tablet 1   No facility-administered medications prior to visit.    Review of Systems;  Patient denies headache, fevers, malaise,  unintentional weight loss, skin rash, eye pain, sinus congestion and sinus pain, sore throat, dysphagia,  hemoptysis , cough, dyspnea, wheezing, chest pain, palpitations, orthopnea, edema, abdominal pain, nausea, melena, diarrhea, constipation, flank pain, dysuria, hematuria, urinary  Frequency, nocturia, numbness, tingling, seizures,  Focal weakness, Loss of consciousness,  Tremor, insomnia, depression, anxiety, and suicidal ideation.      Objective:  BP 140/76 (BP Location: Left Arm, Patient Position: Sitting, Cuff Size: Normal)   Pulse (!) 109   Temp 98.2  F (36.8 C) (Oral)   Ht 5\' 1"  (1.549 m)   Wt 104 lb 12.8 oz (47.5 kg)   SpO2 96%   BMI 19.80 kg/m   BP Readings from Last 3 Encounters:  05/20/20 140/76  02/19/20 122/68  11/09/19 (!) 150/95    Wt Readings from Last 3 Encounters:  05/20/20 104 lb 12.8 oz (47.5 kg)  04/19/20 101 lb (45.8 kg)  02/19/20 101 lb 6.4 oz (46 kg)    General appearance: alert, cooperative and appears stated age Ears: normal TM's and external ear canals both ears Throat: lips, mucosa, and tongue normal; teeth and gums normal Neck: no adenopathy, no carotid bruit, supple, symmetrical, trachea midline and thyroid not enlarged, symmetric, no tenderness/mass/nodules Back: symmetric, no curvature. ROM normal. No CVA tenderness. Lungs: clear to auscultation bilaterally Heart: regular rate and rhythm, S1, S2 normal, no murmur, click, rub or gallop Abdomen: soft, non-tender; bowel sounds normal; no masses,  no organomegaly Pulses: 2+ and symmetric Skin: Skin color, texture, turgor normal. No rashes or lesions Lymph nodes: Cervical, supraclavicular, and axillary nodes normal.  Lab Results  Component Value Date   HGBA1C 5.4 10/03/2018   HGBA1C 5.9 02/21/2018   HGBA1C 6.1 04/05/2017    Lab Results  Component Value Date   CREATININE 0.49 05/20/2020   CREATININE 0.57 03/07/2020   CREATININE 0.72 02/19/2020    Lab Results  Component Value Date   WBC 6.8 06/21/2019   HGB 14.0 06/21/2019   HCT 41.0 06/21/2019   PLT 188.0 06/21/2019   GLUCOSE 135 (H) 05/20/2020   CHOL 187 10/03/2018   TRIG 86 10/03/2018   HDL 95 10/03/2018   LDLDIRECT 119.0 10/11/2014   LDLCALC 75 10/03/2018   ALT 142 (H) 05/20/2020   AST 351 (H) 05/20/2020   NA 128 (L) 05/20/2020   K 4.3 05/20/2020   CL 88 (L) 05/20/2020   CREATININE 0.49 05/20/2020   BUN 8 05/20/2020   CO2 29 05/20/2020   TSH 1.290 08/11/2018   HGBA1C 5.4 10/03/2018    MM 3D SCREEN BREAST BILATERAL  Result Date: 03/01/2020 CLINICAL DATA:  Screening.  EXAM: DIGITAL SCREENING BILATERAL MAMMOGRAM WITH TOMO AND CAD COMPARISON:  Previous exam(s). ACR Breast Density Category b: There are scattered areas of fibroglandular density. FINDINGS: There are no findings suspicious for malignancy. Images were processed with CAD. IMPRESSION: No mammographic evidence of malignancy. A result letter of this screening mammogram will be mailed directly to the patient. RECOMMENDATION: Screening mammogram in one year. (Code:SM-B-01Y) BI-RADS CATEGORY  1: Negative. Electronically Signed   By: Valentino Saxon MD   On: 03/01/2020 11:51    Assessment & Plan:   Problem List Items Addressed This Visit      Unprioritized   Mild malnutrition ([place])   Hyponatremia    Advised to reduce her water intake   Which has been excessive  Lab Results  Component Value Date   NA 128 (L) 05/20/2020   K 4.3 05/20/2020   CL 88 (L) 05/20/2020  CO2 29 05/20/2020         History of alcohol dependence ([facility])   Mild episode of recurrent major depressive disorder ([facility])   Relevant Medications   traZODone (DESYREL) 100 MG tablet   Insomnia due to anxiety and fear    Her previous request for alprazolam has been rescinded after she voluntarily reported use of tranxene from a friend.  She will resume use of trazodone going forward.      Alcoholic peripheral neuropathy ([facility])    Her pain has improved and she is using less tramadol.  Dose reduced to bid .  Refill qty reduced today       Relevant Medications   traZODone (DESYREL) 100 MG tablet   Constipation    Likely aggravated by chronic use of tramadol.  Given her history of diverticulosis. Recommending a daily regimen of stool softener and BFL. Palin films ordered today to evaluate degree of constipation which may require resolution with use of a cathartic       Relevant Orders   DG Abd 1 View   Comprehensive metabolic panel (Completed)   Magnesium (Completed)   Dysuria    She has had a partially treated UTI in February  that has returned, as she has pyuria on today's UA>  Awaiting culture  To treat      Relevant Orders   Urinalysis, Routine w reflex microscopic (Completed)   Urine Culture    Other Visit Diagnoses    B12 deficiency    -  Primary   Relevant Medications   cyanocobalamin ((VITAMIN B-12)) injection 1,000 mcg     A total of 40 minutes was spent with patient more than half of which was spent in counseling patient on the above mentioned issues , reviewing and explaining recent labs and imaging studies done, and coordination of care.   I have discontinued Kathy [name]'s ALPRAZolam. I have also changed her traMADol. Additionally, I am having her start on predniSONE. Lastly, I am having her maintain her dicyclomine, cyclobenzaprine, gabapentin, omeprazole, SYRINGE 3CC/25GX1", cyanocobalamin, DULoxetine HCl, amLODipine, and traZODone. We administered cyanocobalamin. We will continue to administer cyanocobalamin.  Meds ordered this encounter  Medications  . cyanocobalamin ((VITAMIN B-12)) injection 1,000 mcg  . predniSONE (DELTASONE) 10 MG tablet    Sig: 6 tablets on Day 1 , then reduce by 1 tablet daily until gone    Dispense:  21 tablet    Refill:  0  . traZODone (DESYREL) 100 MG tablet    Sig: 1/2 to 1 tablet 30 to 60 minutes before bedtime    Dispense:  90 tablet    Refill:  1  . traMADol (ULTRAM) 50 MG tablet    Sig: TAKE ONE TABLET BY MOUTH  2 times daily as NEEDED  For neuropathy    Dispense:  60 tablet    Refill:  5    Medications Discontinued During This Encounter  Medication Reason  . traZODone (DESYREL) 100 MG tablet Reorder  . traMADol (ULTRAM) 50 MG tablet   . ALPRAZolam (XANAX) 1 MG tablet     Follow-up: No follow-ups on file.   Crecencio Mc, MD

## 2020-05-21 DIAGNOSIS — K5900 Constipation, unspecified: Secondary | ICD-10-CM | POA: Insufficient documentation

## 2020-05-21 DIAGNOSIS — R300 Dysuria: Secondary | ICD-10-CM | POA: Insufficient documentation

## 2020-05-21 LAB — COMPREHENSIVE METABOLIC PANEL
ALT: 142 U/L — ABNORMAL HIGH (ref 0–35)
AST: 351 U/L — ABNORMAL HIGH (ref 0–37)
Albumin: 3.9 g/dL (ref 3.5–5.2)
Alkaline Phosphatase: 456 U/L — ABNORMAL HIGH (ref 39–117)
BUN: 8 mg/dL (ref 6–23)
CO2: 29 mEq/L (ref 19–32)
Calcium: 9.7 mg/dL (ref 8.4–10.5)
Chloride: 88 mEq/L — ABNORMAL LOW (ref 96–112)
Creatinine, Ser: 0.49 mg/dL (ref 0.40–1.20)
GFR: 103.9 mL/min (ref >60.00)
Glucose, Bld: 135 mg/dL — ABNORMAL HIGH (ref 70–99)
Potassium: 4.3 mEq/L (ref 3.5–5.1)
Sodium: 128 mEq/L — ABNORMAL LOW (ref 135–145)
Total Bilirubin: 0.8 mg/dL (ref 0.2–1.2)
Total Protein: 6.7 g/dL (ref 6.0–8.3)

## 2020-05-21 LAB — URINALYSIS, DIPSTICK ONLY
Bilirubin, UA: NEGATIVE
Glucose, UA: NEGATIVE
Ketones, UA: NEGATIVE
Nitrite, UA: POSITIVE — AB
Protein,UA: NEGATIVE
Specific Gravity, UA: <=1.005 — AB (ref 1.005–1.030)
Urobilinogen, Ur: 1 mg/dL (ref 0.2–1.0)
pH, UA: 7 (ref 5.0–7.5)

## 2020-05-21 LAB — URINALYSIS, ROUTINE W REFLEX MICROSCOPIC
Bilirubin Urine: NEGATIVE
Ketones, ur: NEGATIVE
Nitrite: POSITIVE — AB
Specific Gravity, Urine: <=1.005 — AB (ref 1.000–1.030)
Urine Glucose: 100 — AB
Urobilinogen, UA: 1 (ref 0.0–1.0)
pH: 6.5 (ref 5.0–8.0)

## 2020-05-21 LAB — URINE CULTURE
MICRO NUMBER:: 11699875
Result:: NO GROWTH
SPECIMEN QUALITY:: ADEQUATE

## 2020-05-21 LAB — MAGNESIUM: Magnesium: 1.3 mg/dL — ABNORMAL LOW (ref 1.5–2.5)

## 2020-05-21 NOTE — Assessment & Plan Note (Signed)
Likely aggravated by chronic use of tramadol.  Given her history of diverticulosis. Recommending a daily regimen of stool softener and BFL. Palin films ordered today to evaluate degree of constipation which may require resolution with use of a cathartic

## 2020-05-21 NOTE — Assessment & Plan Note (Signed)
Advised to reduce her water intake   Which has been excessive  Lab Results  Component Value Date   NA 128 (L) 05/20/2020   K 4.3 05/20/2020   CL 88 (L) 05/20/2020   CO2 29 05/20/2020

## 2020-05-21 NOTE — Assessment & Plan Note (Signed)
Her previous request for alprazolam has been rescinded after she voluntarily reported use of tranxene from a friend.  She will resume use of trazodone going forward.

## 2020-05-21 NOTE — Assessment & Plan Note (Addendum)
She has had a partially treated UTI in February that has returned, as she has pyuria on today's UA>  Awaiting culture  To treat

## 2020-05-21 NOTE — Assessment & Plan Note (Signed)
Her pain has improved and she is using less tramadol.  Dose reduced to bid .  Refill qty reduced today

## 2020-05-24 ENCOUNTER — Other Ambulatory Visit: Payer: Self-pay | Admitting: Internal Medicine

## 2020-05-24 DIAGNOSIS — J019 Acute sinusitis, unspecified: Secondary | ICD-10-CM | POA: Insufficient documentation

## 2020-05-24 DIAGNOSIS — B9689 Other specified bacterial agents as the cause of diseases classified elsewhere: Secondary | ICD-10-CM

## 2020-05-24 MED ORDER — AMOXICILLIN 500 MG PO CAPS: 500.0000 mg | ORAL_CAPSULE | Freq: Three times a day (TID) | ORAL | 0 refills | Status: DC

## 2020-06-03 NOTE — Telephone Encounter (Signed)
Pt called and wanted to discuss Mychart message  She is requesting to have something called in for her nerves

## 2020-06-04 ENCOUNTER — Other Ambulatory Visit: Payer: Self-pay | Admitting: Internal Medicine

## 2020-06-04 ENCOUNTER — Other Ambulatory Visit: Payer: Self-pay

## 2020-06-04 DIAGNOSIS — F1021 Alcohol dependence, in remission: Secondary | ICD-10-CM

## 2020-06-04 MED ORDER — HYDROXYZINE PAMOATE 50 MG PO CAPS: 50.0000 mg | ORAL_CAPSULE | Freq: Three times a day (TID) | ORAL | 0 refills | Status: DC | PRN

## 2020-06-04 NOTE — Assessment & Plan Note (Signed)
Recent liver enzyme elevation and alcohol use reported.  Advised to resume AA meetings..  Hydroxyzine prescribed for "nerves>"

## 2020-06-05 MED ORDER — HYDROXYZINE PAMOATE 50 MG PO CAPS
50.0000 mg | ORAL_CAPSULE | Freq: Three times a day (TID) | ORAL | 0 refills | Status: DC | PRN
Start: 2020-06-05 — End: 2020-12-18

## 2020-06-06 ENCOUNTER — Other Ambulatory Visit: Payer: Self-pay | Admitting: Gastroenterology

## 2020-06-06 ENCOUNTER — Other Ambulatory Visit (HOSPITAL_COMMUNITY): Payer: Self-pay | Admitting: Gastroenterology

## 2020-06-06 DIAGNOSIS — K219 Gastro-esophageal reflux disease without esophagitis: Secondary | ICD-10-CM | POA: Diagnosis not present

## 2020-06-06 DIAGNOSIS — K5900 Constipation, unspecified: Secondary | ICD-10-CM | POA: Diagnosis not present

## 2020-06-06 DIAGNOSIS — R1084 Generalized abdominal pain: Secondary | ICD-10-CM

## 2020-06-06 DIAGNOSIS — Z86010 Personal history of colonic polyps: Secondary | ICD-10-CM | POA: Diagnosis not present

## 2020-06-06 DIAGNOSIS — R748 Abnormal levels of other serum enzymes: Secondary | ICD-10-CM

## 2020-06-18 ENCOUNTER — Other Ambulatory Visit: Payer: Self-pay

## 2020-06-18 ENCOUNTER — Ambulatory Visit: Admission: RE | Admit: 2020-06-18 | Payer: PPO | Source: Ambulatory Visit

## 2020-06-18 ENCOUNTER — Telehealth: Payer: PPO | Admitting: Internal Medicine

## 2020-06-18 MED ORDER — CYCLOBENZAPRINE HCL 10 MG PO TABS: 10.0000 mg | ORAL_TABLET | Freq: Three times a day (TID) | ORAL | 1 refills | Status: DC | PRN

## 2020-06-19 ENCOUNTER — Telehealth: Payer: PPO | Admitting: Internal Medicine

## 2020-06-26 ENCOUNTER — Other Ambulatory Visit: Payer: Self-pay | Admitting: Internal Medicine

## 2020-06-26 ENCOUNTER — Other Ambulatory Visit: Payer: Self-pay

## 2020-06-26 ENCOUNTER — Ambulatory Visit (INDEPENDENT_AMBULATORY_CARE_PROVIDER_SITE_OTHER): Payer: PPO

## 2020-06-26 DIAGNOSIS — E538 Deficiency of other specified B group vitamins: Secondary | ICD-10-CM

## 2020-06-26 MED ORDER — CYANOCOBALAMIN 1000 MCG/ML IJ SOLN
1000.0000 ug | Freq: Once | INTRAMUSCULAR | Status: AC
  Administered 2020-06-26: 1000 ug via INTRAMUSCULAR

## 2020-06-26 NOTE — Progress Notes (Signed)
Patient presented for B 12 injection to left deltoid, patient voiced no concerns nor showed any signs of distress during injection. 

## 2020-07-08 ENCOUNTER — Telehealth: Payer: Self-pay | Admitting: Internal Medicine

## 2020-07-08 NOTE — Telephone Encounter (Signed)
Patient called in about her virtual appointment she stated that she got a call to come in instead of a virtual she tried to cancel the 4/26 4:30 virtual but it shows that it was a no show

## 2020-07-08 NOTE — Telephone Encounter (Signed)
Appointment has been canceled . She should not receive a no show fee.

## 2020-07-09 ENCOUNTER — Ambulatory Visit: Payer: PPO

## 2020-07-11 ENCOUNTER — Telehealth (INDEPENDENT_AMBULATORY_CARE_PROVIDER_SITE_OTHER): Payer: PPO | Admitting: Internal Medicine

## 2020-07-11 ENCOUNTER — Encounter: Payer: Self-pay | Admitting: Internal Medicine

## 2020-07-11 DIAGNOSIS — F1024 Alcohol dependence with alcohol-induced mood disorder: Secondary | ICD-10-CM

## 2020-07-11 MED ORDER — CHLORDIAZEPOXIDE HCL 25 MG PO CAPS: 25.0000 mg | ORAL_CAPSULE | Freq: Three times a day (TID) | ORAL | 0 refills | Status: DC

## 2020-07-11 NOTE — Assessment & Plan Note (Addendum)
She has been drinking daily for the past 3 weeks.  She has tremors the following morning after drinking and is averaging  at least one bottle of wine daily. Starting librium 25 mg tid.  Follow up one week with plan for dose reduction each week.

## 2020-07-11 NOTE — Progress Notes (Signed)
Virtual Visit via caregility   This visit type was conducted due to national recommendations for restrictions regarding the COVID-19 pandemic (e.g. social distancing).  This format is felt to be most appropriate for this patient at this time.  All issues noted in this document were discussed and addressed.  No physical exam was performed (except for noted visual exam findings with Video Visits).   I connected with@ on 07/11/20 at  9:00 AM EDT by a video enabled telemedicine application  and verified that I am speaking with the correct person using two identifiers. Location patient: home Location provider: work or home office Persons participating in the virtual visit: patient, provider  I discussed the limitations, risks, security and privacy concerns of performing an evaluation and management service by telephone and the availability of in person appointments. I also discussed with the patient that there may be a patient responsible charge related to this service. The patient expressed understanding and agreed to proceed.  Reason for visit: follow up on anxiety complicated by alcoholism and neuropathy  HPI:  60 yr old female with history of alcoholism,  Admitted for rehabilitation in August 2017 and celebrated 4 years or sobriety but began drinking again in April 2022 after several months of requesting benzodiazepines for anxiety.  She sent a mychart message on May 9 reporting a weekend of drinking and requested librium to help her manage the tremors, since this is what was given to her during rehab.  She was advised that I would not prescribe this medication without an office visit and was advised to go to the ER.  She did not ,  And has been drinking daily since late April.     She cites increased work stress,  Family conflict, and chronic pain as triggers for her alcohol abuse. She dared herself to drink a glass of wine in late April   She presents today for same. Last ETOH drink was yesterday  and she states that she drank "more than a bottle of wine."  She has been waking up daily with tremors and drinking daily to avoid them.   She will not state when she stopped attending [place] meetings but is worried about alienating her family again .   ROS: See pertinent positives and negatives per HPI.  Past Medical History:  Diagnosis Date  . Alcohol abuse   . Chicken pox   . Colon polyps   . Depression   . Hernia of abdominal cavity   . Hip fracture ([place]) right  . Neuropathy     No past surgical history on file.  Family History  Problem Relation Age of Onset  . Hypertension Mother   . Osteoporosis Mother   . Meniere's disease Mother   . Cancer Father 42       prostate  . Colon cancer Paternal Grandmother   . Alcohol abuse Other     SOCIAL HX:  reports that she has been smoking cigarettes. She has a 30.00 pack-year smoking history. She has never used smokeless tobacco. She reports current alcohol use of about 42.0 standard drinks of alcohol per week. She reports that she does not use drugs.  Current Outpatient Medications:  .  amLODipine (NORVASC) 10 MG tablet, Take by mouth., Disp: , Rfl:  .  cyclobenzaprine (FLEXERIL) 10 MG tablet, Take 1 tablet (10 mg total) by mouth 3 (three) times daily as needed for muscle spasms. Involving the legs and feet, Disp: 90 tablet, Rfl: 1 .  DULoxetine HCl 40  MG CPEP, Take 40 mg by mouth daily., Disp: 30 capsule, Rfl: 5 .  gabapentin (NEURONTIN) 400 MG capsule, Take 2 capsules (800 mg total) by mouth 3 (three) times daily., Disp: 180 capsule, Rfl: 5 .  hydrOXYzine (VISTARIL) 50 MG capsule, Take 1 capsule (50 mg total) by mouth 3 (three) times daily as needed., Disp: 90 capsule, Rfl: 0 .  omeprazole (PRILOSEC) 40 MG capsule, Take 1 capsule (40 mg total) by mouth 2 (two) times daily., Disp: 180 capsule, Rfl: 1 .  predniSONE (DELTASONE) 10 MG tablet, 6 tablets on Day 1 , then reduce by 1 tablet daily until gone, Disp: 21 tablet, Rfl: 0 .  traMADol  (ULTRAM) 50 MG tablet, TAKE ONE TABLET BY MOUTH  2 times daily as NEEDED  For neuropathy, Disp: 60 tablet, Rfl: 5 .  traZODone (DESYREL) 100 MG tablet, 1/2 to 1 tablet 30 to 60 minutes before bedtime, Disp: 90 tablet, Rfl: 1 .  amLODipine (NORVASC) 5 MG tablet, TAKE 1 TABLET BY MOUTH TWICE DAILY (Patient not taking: Reported on 07/11/2020), Disp: 180 tablet, Rfl: 0 .  amoxicillin (AMOXIL) 500 MG capsule, Take 1 capsule (500 mg total) by mouth 3 (three) times daily. (Patient not taking: Reported on 07/11/2020), Disp: 21 capsule, Rfl: 0 .  chlordiazePOXIDE (LIBRIUM) 25 MG capsule, Take 1 capsule (25 mg total) by mouth 3 (three) times daily., Disp: 21 capsule, Rfl: 0 .  cyanocobalamin (,VITAMIN B-12,) 1000 MCG/ML injection, Inject 1 mL (1,000 mcg total) into the muscle every 14 (fourteen) days. (Patient not taking: Reported on 07/11/2020), Disp: 6 mL, Rfl: 3 .  dicyclomine (BENTYL) 20 MG tablet, Take 1 tablet (20 mg total) by mouth every 6 (six) hours as needed for spasms. (Patient not taking: Reported on 07/11/2020), Disp: 120 tablet, Rfl: 5 .  Syringe/Needle, Disp, (SYRINGE 3CC/25GX1") 25G X 1" 3 ML MISC, Use for b12 injections (Patient not taking: Reported on 07/11/2020), Disp: 50 each, Rfl: 0  Current Facility-Administered Medications:  .  cyanocobalamin ((VITAMIN B-12)) injection 1,000 mcg, 1,000 mcg, Intramuscular, Q14 Days, Crecencio Mc, MD, 1,000 mcg at 05/20/20 1519  EXAM:  VITALS per patient if applicable:  GENERAL: alert, oriented, appears well and appears anxious but in no acute distress  HEENT: atraumatic, conjunttiva clear, no obvious abnormalities on inspection of external nose and ears  NECK: normal movements of the head and neck  LUNGS: on inspection no signs of respiratory distress, breathing rate appears normal, no obvious gross SOB, gasping or wheezing  CV: no obvious cyanosis  MS: moves all visible extremities without noticeable abnormality  PSYCH/NEURO: pleasant and  cooperative, no obvious depression or anxiety, speech and thought processing grossly intact  ASSESSMENT AND PLAN:  Discussed the following assessment and plan:  Alcohol dependence with alcohol-induced mood disorder (HCC)  Alcohol dependence with alcohol-induced mood disorder ([place]) She has been drinking daily for the past 3 weeks.  She has tremors the following morning after drinking and is averaging  at least one bottle of wine daily. Starting librium 25 mg tid.  Follow up one week with plan for dose reduction each week.     I discussed the assessment and treatment plan with the patient. The patient was provided an opportunity to ask questions and all were answered. The patient agreed with the plan and demonstrated an understanding of the instructions.   The patient was advised to call back or seek an in-person evaluation if the symptoms worsen or if the condition fails to improve as anticipated.  I spent 30 minutes dedicated to the care of this patient on the date of this encounter to include pre-visit review of his medical history,  Face-to-face time with the patient , and post visit ordering of testing and therapeutics.    Crecencio Mc, MD

## 2020-07-12 ENCOUNTER — Telehealth: Payer: Self-pay | Admitting: Internal Medicine

## 2020-07-12 DIAGNOSIS — F1024 Alcohol dependence with alcohol-induced mood disorder: Secondary | ICD-10-CM

## 2020-07-12 MED ORDER — CHLORDIAZEPOXIDE HCL 25 MG PO CAPS: 25.0000 mg | ORAL_CAPSULE | Freq: Three times a day (TID) | ORAL | 0 refills | Status: DC

## 2020-07-12 NOTE — Telephone Encounter (Signed)
fyi Kathy [name]  I sent in for patient.   I think [facility] has sent in but I saw this after I sent librium  Would you check with [facility] and ensure they have and all good here?  They can cancel mine      I looked up patient on [place] Controlled Substances Reporting System PMP AWARE and saw no activity that raised concern of inappropriate use.

## 2020-07-12 NOTE — Addendum Note (Signed)
Addended by: Burnard Hawthorne on: 07/12/2020 04:45 PM   Modules accepted: Orders

## 2020-07-12 NOTE — Addendum Note (Signed)
Addended by: Crecencio Mc on: 07/12/2020 04:41 PM   Modules accepted: Orders

## 2020-07-12 NOTE — Telephone Encounter (Signed)
PT called back to advise that she is out of her meds and needs a refill urgently of her Librium called into the [facility] on [address] in [place].

## 2020-07-12 NOTE — Telephone Encounter (Signed)
Librium rx resent to [facility]

## 2020-07-12 NOTE — Telephone Encounter (Signed)
Patient  Can not get her chlordiazePOXIDE (LIBRIUM) 25 MG capsule at [facility] is out. Please send prescription to [facility] on [address] in [place].

## 2020-07-12 NOTE — Telephone Encounter (Signed)
Patient called back was advised that a provider has to sign off on the medication  Her provider is not in office today, also was told that you have 24-72 hours to refill a prescription

## 2020-07-12 NOTE — Telephone Encounter (Signed)
RX Refill:librium Last Seen:07-11-20 Last ordered:07-12-20

## 2020-07-14 ENCOUNTER — Encounter: Payer: Self-pay | Admitting: Internal Medicine

## 2020-07-15 ENCOUNTER — Ambulatory Visit: Payer: PPO

## 2020-07-18 ENCOUNTER — Telehealth (INDEPENDENT_AMBULATORY_CARE_PROVIDER_SITE_OTHER): Payer: PPO | Admitting: Internal Medicine

## 2020-07-18 ENCOUNTER — Encounter: Payer: Self-pay | Admitting: Internal Medicine

## 2020-07-18 DIAGNOSIS — F1024 Alcohol dependence with alcohol-induced mood disorder: Secondary | ICD-10-CM

## 2020-07-18 MED ORDER — CHLORDIAZEPOXIDE HCL 25 MG PO CAPS: 25.0000 mg | ORAL_CAPSULE | Freq: Two times a day (BID) | ORAL | 0 refills | Status: DC

## 2020-07-18 NOTE — Progress Notes (Signed)
Virtual Visit via [place]  This visit type was conducted due to national recommendations for restrictions regarding the COVID-19 pandemic (e.g. social distancing).  This format is felt to be most appropriate for this patient at this time.  All issues noted in this document were discussed and addressed.  No physical exam was performed (except for noted visual exam findings with Video Visits).   I connected with@ on 07/18/20 at 12:30 PM EDT by a video enabled telemedicine application and verified that I am speaking with the correct person using two identifiers. Location patient: home Location provider: work or home office Persons participating in the virtual visit: patient, provider  I discussed the limitations, risks, security and privacy concerns of performing an evaluation and management service by telephone and the availability of in person appointments. I also discussed with the patient that there may be a patient responsible charge related to this service. The patient expressed understanding and agreed to proceed.  Reason for visit: follow up on alcohol detoxification   HPI:    Kathy [name] is a 60 yr old woman with a history of alcoholisn,  Sober x 4 years ,  seen one week ago for assistance in alcohol detoxification  aeveral weeks of ongoing alcohol abuse.  Was started on librium 25 mg three times daily one week ago,  And presents for follow up.   She is very calm and articulate today.  Her main goal is "is to not disappoint my family. " she has not touched alcohol since receiving the librium prescription.  She has been taking 50 mg  In the evening to manage the tremors and 25 mg in the afternoon if needed  (most days, yes)  . She has not taken more than 75 mg daily   Her stressors were financial strain.  Her employer , who has allowed her to live at the [facility], has been struggling financially since the pandemic and has been  Adversarial toward the employees .  She has been feeling that her  livelihood  Has seen threatened, and she started using alcohol to calm her nerves.      ROS: See pertinent positives and negatives per HPI.  Past Medical History:  Diagnosis Date  . Alcohol abuse   . Chicken pox   . Colon polyps   . Depression   . Hernia of abdominal cavity   . Hip fracture ([place]) right  . Neuropathy     No past surgical history on file.  Family History  Problem Relation Age of Onset  . Hypertension Mother   . Osteoporosis Mother   . Meniere's disease Mother   . Cancer Father 47       prostate  . Colon cancer Paternal Grandmother   . Alcohol abuse Other     SOCIAL HX:  reports that she has been smoking cigarettes. She has a 30.00 pack-year smoking history. She has never used smokeless tobacco. She reports current alcohol use of about 42.0 standard drinks of alcohol per week. She reports that she does not use drugs.   Current Outpatient Medications:  .  amLODipine (NORVASC) 10 MG tablet, Take by mouth., Disp: , Rfl:  .  cyanocobalamin (,VITAMIN B-12,) 1000 MCG/ML injection, Inject 1 mL (1,000 mcg total) into the muscle every 14 (fourteen) days., Disp: 6 mL, Rfl: 3 .  cyclobenzaprine (FLEXERIL) 10 MG tablet, Take 1 tablet (10 mg total) by mouth 3 (three) times daily as needed for muscle spasms. Involving the legs and feet, Disp: 90  tablet, Rfl: 1 .  DULoxetine HCl 40 MG CPEP, Take 40 mg by mouth daily., Disp: 30 capsule, Rfl: 5 .  gabapentin (NEURONTIN) 400 MG capsule, Take 2 capsules (800 mg total) by mouth 3 (three) times daily., Disp: 180 capsule, Rfl: 5 .  hydrOXYzine (VISTARIL) 50 MG capsule, Take 1 capsule (50 mg total) by mouth 3 (three) times daily as needed., Disp: 90 capsule, Rfl: 0 .  omeprazole (PRILOSEC) 40 MG capsule, Take 1 capsule (40 mg total) by mouth 2 (two) times daily., Disp: 180 capsule, Rfl: 1 .  Syringe/Needle, Disp, (SYRINGE 3CC/25GX1") 25G X 1" 3 ML MISC, Use for b12 injections, Disp: 50 each, Rfl: 0 .  traMADol (ULTRAM) 50 MG tablet,  TAKE ONE TABLET BY MOUTH  2 times daily as NEEDED  For neuropathy, Disp: 60 tablet, Rfl: 5 .  traZODone (DESYREL) 100 MG tablet, 1/2 to 1 tablet 30 to 60 minutes before bedtime, Disp: 90 tablet, Rfl: 1 .  chlordiazePOXIDE (LIBRIUM) 25 MG capsule, Take 1 capsule (25 mg total) by mouth in the morning and at bedtime., Disp: 14 capsule, Rfl: 0 .  dicyclomine (BENTYL) 20 MG tablet, Take 1 tablet (20 mg total) by mouth every 6 (six) hours as needed for spasms. (Patient not taking: No sig reported), Disp: 120 tablet, Rfl: 5  Current Facility-Administered Medications:  .  cyanocobalamin ((VITAMIN B-12)) injection 1,000 mcg, 1,000 mcg, Intramuscular, Q14 Days, Crecencio Mc, MD, 1,000 mcg at 05/20/20 1519  EXAM:  VITALS per patient if applicable:  GENERAL: alert, oriented, appears well and in no acute distress  HEENT: atraumatic, conjunttiva clear, no obvious abnormalities on inspection of external nose and ears  NECK: normal movements of the head and neck  LUNGS: on inspection no signs of respiratory distress, breathing rate appears normal, no obvious gross SOB, gasping or wheezing  CV: no obvious cyanosis  MS: moves all visible extremities without noticeable abnormality  PSYCH/NEURO: pleasant and cooperative, no obvious depression or anxiety, speech and thought processing grossly intact  ASSESSMENT AND PLAN:  Discussed the following assessment and plan:  Alcohol dependence with alcohol-induced mood disorder (HCC) - Plan: chlordiazePOXIDE (LIBRIUM) 25 MG capsule  Alcohol dependence with alcohol-induced mood disorder (HCC) She has been taking 75 mg  daily for the past week , with 50 mg in the evening to manage the tremors that she has often woken up with  After drinking .  She has not had any tremors on this regimen .  I advised her to reduce the librium daily dose from 75 mg to 50 mg daily this week, Follow up one week with plan for dose reduction each week.     I discussed the  assessment and treatment plan with the patient. The patient was provided an opportunity to ask questions and all were answered. The patient agreed with the plan and demonstrated an understanding of the instructions.   The patient was advised to call back or seek an in-person evaluation if the symptoms worsen or if the condition fails to improve as anticipated.   Crecencio Mc, MD

## 2020-07-19 NOTE — Assessment & Plan Note (Addendum)
She has been taking 75 mg  daily for the past week , with 50 mg in the evening to manage the tremors that she has often woken up with  After drinking .  She has not had any tremors on this regimen .  I advised her to reduce the librium daily dose from 75 mg to 50 mg daily this week, Follow up one week with plan for dose reduction each week.

## 2020-07-25 ENCOUNTER — Telehealth (INDEPENDENT_AMBULATORY_CARE_PROVIDER_SITE_OTHER): Payer: PPO | Admitting: Internal Medicine

## 2020-07-25 ENCOUNTER — Telehealth: Payer: Self-pay

## 2020-07-25 ENCOUNTER — Encounter: Payer: Self-pay | Admitting: Internal Medicine

## 2020-07-25 DIAGNOSIS — F1024 Alcohol dependence with alcohol-induced mood disorder: Secondary | ICD-10-CM

## 2020-07-25 MED ORDER — CHLORDIAZEPOXIDE HCL 10 MG PO CAPS: 10.0000 mg | ORAL_CAPSULE | Freq: Four times a day (QID) | ORAL | 0 refills | Status: DC | PRN

## 2020-07-25 NOTE — Progress Notes (Signed)
Telephone  Note  This visit type was conducted due to national recommendations for restrictions regarding the COVID-19 pandemic (e.g. social distancing).  This format is felt to be most appropriate for this patient at this time.  All issues noted in this document were discussed and addressed.  No physical exam was performed (except for noted visual exam findings with Video Visits).   I connected with@ on 07/25/20 at 12:30 PM EDT by  telephone and verified that I am speaking with the correct person using two identifiers. Location patient: home Location provider: work or home office Persons participating in the virtual visit: patient, provider  I discussed the limitations, risks, security and privacy concerns of performing an evaluation and management service by telephone and the availability of in person appointments. I also discussed with the patient that there may be a patient responsible charge related to this service. The patient expressed understanding and agreed to proceed.  Interactive audio and video telecommunications were attempted between this provider and patient, however failed, due to patient having technical difficulties OR patient did not have access to video capability.  We continued and completed visit with audio only.   Reason for visit: follow up on alcohol abuse with ongoing detox  HPI:  60 yr old female with history of alcohol abuse and nearly 4 years of sobriety prior to recent  Recidivism attributed to stress at work.  She had requested use of librium to managed her detox,  As this was successful and tolerated during her inpatient detox several years ago.  Thus far she has been sober for 2 weeks using librium.  Still using 50 mg  Total of librium at night (25 mg at bedtime, 25 mg  4 am) to avoid 7 am tremors.  Feels nauseated at times with 25 mg dose.  Discussed using 10 mg dose up to 4 daily as next step in reducing her reliance on Librium  Using gabapentin and tramadol to  manage her painful neuropathy.  She denies tremors as long as she uses the librium. Feels calm,  Appetite has been fine.    ROS: See pertinent positives and negatives per HPI.  Past Medical History:  Diagnosis Date  . Alcohol abuse   . Chicken pox   . Colon polyps   . Depression   . Hernia of abdominal cavity   . Hip fracture ([place]) right  . Neuropathy     No past surgical history on file.  Family History  Problem Relation Age of Onset  . Hypertension Mother   . Osteoporosis Mother   . Meniere's disease Mother   . Cancer Father 53       prostate  . Colon cancer Paternal Grandmother   . Alcohol abuse Other     SOCIAL HX:  reports that she has been smoking cigarettes. She has a 30.00 pack-year smoking history. She has never used smokeless tobacco. She reports no  alcohol use in 2 weeks  Current Outpatient Medications:  .  amLODipine (NORVASC) 10 MG tablet, Take by mouth., Disp: , Rfl:  .  [START ON 08/22/2020] chlordiazePOXIDE (LIBRIUM) 10 MG capsule, Take 1 capsule (10 mg total) by mouth 4 (four) times daily as needed for withdrawal., Disp: 28 capsule, Rfl: 0 .  chlordiazePOXIDE (LIBRIUM) 25 MG capsule, Take 1 capsule (25 mg total) by mouth in the morning and at bedtime., Disp: 14 capsule, Rfl: 0 .  cyanocobalamin (,VITAMIN B-12,) 1000 MCG/ML injection, Inject 1 mL (1,000 mcg total) into the muscle every  14 (fourteen) days., Disp: 6 mL, Rfl: 3 .  cyclobenzaprine (FLEXERIL) 10 MG tablet, Take 1 tablet (10 mg total) by mouth 3 (three) times daily as needed for muscle spasms. Involving the legs and feet, Disp: 90 tablet, Rfl: 1 .  dicyclomine (BENTYL) 20 MG tablet, Take 1 tablet (20 mg total) by mouth every 6 (six) hours as needed for spasms., Disp: 120 tablet, Rfl: 5 .  DULoxetine HCl 40 MG CPEP, Take 40 mg by mouth daily., Disp: 30 capsule, Rfl: 5 .  gabapentin (NEURONTIN) 400 MG capsule, Take 2 capsules (800 mg total) by mouth 3 (three) times daily., Disp: 180 capsule, Rfl: 5 .   hydrOXYzine (VISTARIL) 50 MG capsule, Take 1 capsule (50 mg total) by mouth 3 (three) times daily as needed., Disp: 90 capsule, Rfl: 0 .  omeprazole (PRILOSEC) 40 MG capsule, Take 1 capsule (40 mg total) by mouth 2 (two) times daily., Disp: 180 capsule, Rfl: 1 .  Syringe/Needle, Disp, (SYRINGE 3CC/25GX1") 25G X 1" 3 ML MISC, Use for b12 injections, Disp: 50 each, Rfl: 0 .  traMADol (ULTRAM) 50 MG tablet, TAKE ONE TABLET BY MOUTH  2 times daily as NEEDED  For neuropathy, Disp: 60 tablet, Rfl: 5 .  traZODone (DESYREL) 100 MG tablet, 1/2 to 1 tablet 30 to 60 minutes before bedtime, Disp: 90 tablet, Rfl: 1  Current Facility-Administered Medications:  .  cyanocobalamin ((VITAMIN B-12)) injection 1,000 mcg, 1,000 mcg, Intramuscular, Q14 Days, Crecencio Mc, MD, 1,000 mcg at 05/20/20 1519  EXAM:   General impression: alert, cooperative and articulate.  No signs of being in distress  Lungs: speech is fluent sentence length suggests that patient is not short of breath and not punctuated by cough, sneezing or sniffing. [name]   Psych: affect normal.  speech is articulate and non pressured .  Denies suicidal thoughts    ASSESSMENT AND PLAN:  Discussed the following assessment and plan:  Alcohol dependence with alcohol-induced mood disorder ([place]) - Plan: AMB Referral to [facility]  Alcohol dependence with alcohol-induced mood disorder ([place]) She has been taking 50 to  75 mg  daily for the past week , with 50 mg in the evening in divided doses (bedtime and 4 am)  to manage the tremors that she has often woken up with  After drinking .  She has not had any tremors on this regimen .  I advised her to reduce the librium daily dose from 40 mg  daily this week, using the 10 mg tablets I have prescribed.  Follow up one week    I discussed the assessment and treatment plan with the patient. The patient was provided an opportunity to ask questions and all were answered. The patient agreed with the  plan and demonstrated an understanding of the instructions.   The patient was advised to call back or seek an in-person evaluation if the symptoms worsen or if the condition fails to improve as anticipated.   I spent 30 minutes dedicated to the care of this patient on the date of this encounter to include pre-visit review of his medical history,  telephone  time with the patient , and post visit ordering of testing and therapeutics.    Crecencio Mc, MD

## 2020-07-25 NOTE — Telephone Encounter (Signed)
Patient called the office at 7:40am to confirm appointment time.

## 2020-07-25 NOTE — Telephone Encounter (Signed)
noted 

## 2020-07-26 ENCOUNTER — Other Ambulatory Visit: Payer: Self-pay

## 2020-07-26 ENCOUNTER — Ambulatory Visit (INDEPENDENT_AMBULATORY_CARE_PROVIDER_SITE_OTHER): Payer: PPO

## 2020-07-26 DIAGNOSIS — E538 Deficiency of other specified B group vitamins: Secondary | ICD-10-CM

## 2020-07-26 MED ORDER — CYANOCOBALAMIN 1000 MCG/ML IJ SOLN
1000.0000 ug | Freq: Once | INTRAMUSCULAR | Status: AC
  Administered 2020-07-26: 1000 ug via INTRAMUSCULAR

## 2020-07-26 NOTE — Progress Notes (Signed)
Patient presented for B 12 injection to left deltoid, patient voiced no concerns nor showed any signs of distress during injection. 

## 2020-07-27 NOTE — Assessment & Plan Note (Signed)
She has been taking 50 to  75 mg  daily for the past week , with 50 mg in the evening in divided doses (bedtime and 4 am)  to manage the tremors that she has often woken up with  After drinking .  She has not had any tremors on this regimen .  I advised her to reduce the librium daily dose from 40 mg  daily this week, using the 10 mg tablets I have prescribed.  Follow up one week

## 2020-08-01 ENCOUNTER — Telehealth: Payer: Self-pay

## 2020-08-01 NOTE — Telephone Encounter (Signed)
   Telephone encounter was:  Unsuccessful.  08/01/2020 Name: Kathy [name] MRN: [id number] DOB: 09-26-1960  Unsuccessful outbound call made today to assist with:   social connections  Outreach Attempt:  1st Attempt  A HIPAA compliant voice message was left requesting a return call.  Instructed patient to call back at [phone].  Rebekha Diveley, AAS Paralegal, [facility]  [address], [place] [address] ??[email or web address]  ?? [phone]   [email or web address]

## 2020-08-02 ENCOUNTER — Telehealth: Payer: Self-pay | Admitting: Internal Medicine

## 2020-08-02 NOTE — Telephone Encounter (Signed)
Patient advised and voiced understading wit she would be evaluated if symptoms reoccur.

## 2020-08-02 NOTE — Telephone Encounter (Signed)
Patient's mother called stating that the patient is acting drunk can't drive and may lose her job because of it. Stated also that the patient is taking Librium and may be taking other medication with the Librium. Mother states she and the patient's father are responsible for her and they are concerned.

## 2020-08-02 NOTE — Telephone Encounter (Signed)
Patient mother called of concerns with Librium causing dizziness and she is in weaning off the Librium on 07/18/20 was taking 75 MG daily was to reduce dose to 50 mg and then to 40 mg daily in divided doses. I advised patient mother and father I coud not discuss patient medical information but I could listen to their concerns. Mother advised patient is acting confused at times like does not know what she is saying. Patient mother and father ask for [place] to not notify patient of their call and concerns. I advised them without evaluation of the patient I could not advise of cause, that she needs to be evaluated they stated they could not take her now but if symptoms continue they would take patient to ER. I again encouraged evaluation today I advised it could be serious they still declined.

## 2020-08-02 NOTE — Telephone Encounter (Signed)
I agree with need for evaluation today. She should be seen. I think we need to contact the patient to see how she is doing on the librium taper to help elicit a better history given the reported symptoms. Can you contact the patient and check in on her?

## 2020-08-02 NOTE — Telephone Encounter (Signed)
Patient speaking clear not garbled no confusion in person place or time of day she could verbalize it was after lunch but before dinner, she was able to advise the taper regimen that PCP had prescribed for her librium. Patient advised she was to call back next week and advise PCP of how she is doing. Patient did say in the mornings she is groggy if she takes the librium and gabapentin the night before so she felt a little "loopier" this morning than normal. Patient did state her neuropathy is worse than normal advised she needs to make an appointment if and Follow up on the neuropathy with PCP.

## 2020-08-05 ENCOUNTER — Telehealth: Payer: Self-pay | Admitting: Internal Medicine

## 2020-08-05 NOTE — Telephone Encounter (Signed)
Called patient to schedule Prolia injection , patient stated she will callback to schedule when she has her calendar available to schedule. Please schedule patient [place] for Prolia.

## 2020-08-07 ENCOUNTER — Telehealth: Payer: Self-pay

## 2020-08-07 NOTE — Telephone Encounter (Signed)
   Telephone encounter was:  Unsuccessful.  08/07/2020 Name: Kathy [name] MRN: [id number] DOB: Jan 02, 1961  Unsuccessful outbound call made today to assist with:   social connections  Outreach Attempt:  2nd Attempt  A HIPAA compliant voice message was left requesting a return call.  Instructed patient to call back at [phone].  Rhyland Hinderliter, AAS Paralegal, [facility]  [address],  [address] ??[email or web address]  ?? [phone]   [email or web address]

## 2020-08-12 ENCOUNTER — Telehealth: Payer: Self-pay

## 2020-08-12 NOTE — Telephone Encounter (Signed)
   Telephone encounter was:  Unsuccessful.  08/12/2020 Name: Kathy [name] MRN: [id number] DOB: June 03, 1960  Unsuccessful outbound call made today to assist with:   social connections.  Outreach Attempt:  3rd Attempt.  Referral closed unable to contact patient.  A HIPAA compliant voice message was left requesting a return call.  Instructed patient to call back at [phone].  Kavya Haag, AAS Paralegal, [facility]  [address], [place] [address] ??[email or web address]  ?? [phone]   [email or web address]

## 2020-08-15 ENCOUNTER — Encounter: Payer: Self-pay | Admitting: Emergency Medicine

## 2020-08-15 ENCOUNTER — Emergency Department: Payer: PPO

## 2020-08-15 ENCOUNTER — Emergency Department
Admission: EM | Admit: 2020-08-15 | Discharge: 2020-08-15 | Disposition: A | Discharge status: Home / Self Care | Payer: PPO | Attending: Emergency Medicine | Admitting: Emergency Medicine

## 2020-08-15 DIAGNOSIS — R079 Chest pain, unspecified: Secondary | ICD-10-CM | POA: Diagnosis not present

## 2020-08-15 DIAGNOSIS — M6281 Muscle weakness (generalized): Secondary | ICD-10-CM | POA: Diagnosis not present

## 2020-08-15 DIAGNOSIS — I700 Atherosclerosis of aorta: Secondary | ICD-10-CM | POA: Diagnosis not present

## 2020-08-15 DIAGNOSIS — R0602 Shortness of breath: Secondary | ICD-10-CM | POA: Diagnosis not present

## 2020-08-15 DIAGNOSIS — R0981 Nasal congestion: Secondary | ICD-10-CM | POA: Insufficient documentation

## 2020-08-15 DIAGNOSIS — R630 Anorexia: Secondary | ICD-10-CM | POA: Diagnosis not present

## 2020-08-15 DIAGNOSIS — Z5321 Procedure and treatment not carried out due to patient leaving prior to being seen by health care provider: Secondary | ICD-10-CM | POA: Insufficient documentation

## 2020-08-15 DIAGNOSIS — K449 Diaphragmatic hernia without obstruction or gangrene: Secondary | ICD-10-CM | POA: Diagnosis not present

## 2020-08-15 DIAGNOSIS — Z202 Contact with and (suspected) exposure to infections with a predominantly sexual mode of transmission: Secondary | ICD-10-CM | POA: Insufficient documentation

## 2020-08-15 DIAGNOSIS — Z20822 Contact with and (suspected) exposure to covid-19: Secondary | ICD-10-CM | POA: Diagnosis not present

## 2020-08-15 LAB — CBC
HCT: 40.6 % (ref 36.0–46.0)
Hemoglobin: 14.3 g/dL (ref 12.0–15.0)
MCH: 34.5 pg — ABNORMAL HIGH (ref 26.0–34.0)
MCHC: 35.2 g/dL (ref 30.0–36.0)
MCV: 97.8 fL (ref 80.0–100.0)
Platelets: 166 10*3/uL (ref 150–400)
RBC: 4.15 MIL/uL (ref 3.87–5.11)
RDW: 13 % (ref 11.5–15.5)
WBC: 5.9 10*3/uL (ref 4.0–10.5)
nRBC: 0 % (ref 0.0–0.2)

## 2020-08-15 LAB — BASIC METABOLIC PANEL
Anion gap: 32 — ABNORMAL HIGH (ref 5–15)
BUN: 13 mg/dL (ref 6–20)
CO2: 16 mmol/L — ABNORMAL LOW (ref 22–32)
Calcium: 9 mg/dL (ref 8.9–10.3)
Chloride: 89 mmol/L — ABNORMAL LOW (ref 98–111)
Creatinine, Ser: 0.96 mg/dL (ref 0.44–1.00)
GFR, Estimated: >60 mL/min (ref >60)
Glucose, Bld: 101 mg/dL — ABNORMAL HIGH (ref 70–99)
Potassium: 3.4 mmol/L — ABNORMAL LOW (ref 3.5–5.1)
Sodium: 137 mmol/L (ref 135–145)

## 2020-08-15 LAB — TROPONIN I (HIGH SENSITIVITY): Troponin I (High Sensitivity): 10 ng/L (ref <18)

## 2020-08-15 LAB — RESP PANEL BY RT-PCR (FLU A&B, COVID) ARPGX2
Influenza A by PCR: NEGATIVE
Influenza B by PCR: NEGATIVE
SARS Coronavirus 2 by RT PCR: NEGATIVE

## 2020-08-15 NOTE — ED Triage Notes (Signed)
Pt c/o central chest pain with SOB, bilateral lower extremity weakness, decrease in oral intake and nasal congestion. Pt had +COVID at home today. Symptoms over 2 days but worse today.   Pt with hx/o alcoholism and reports drinking 2-3 drinks per day.

## 2020-08-15 NOTE — ED Notes (Signed)
Pt came to the door and reported to this RN she is leaving. Steady gait noted.

## 2020-08-15 NOTE — ED Notes (Signed)
Pt reported to this RN she is tired of waiting and wanting something to drink and to go smoke. This RN reported to the pt the plan of care and encouraged to wait for upcoming imaging. MD made aware.

## 2020-08-15 NOTE — ED Notes (Signed)
Pt. Expressed to RN that she wanted her IV removed and she wanted to go out and smoke. RN explained to pt. necesity for IV and plan of care. Pt. Encouraged to wait for MD assessment. MD aware. Pt. Decided to LWBS. MD aware.

## 2020-08-19 ENCOUNTER — Encounter: Payer: Self-pay | Admitting: *Deleted

## 2020-08-20 ENCOUNTER — Other Ambulatory Visit: Payer: Self-pay

## 2020-08-20 ENCOUNTER — Ambulatory Visit (INDEPENDENT_AMBULATORY_CARE_PROVIDER_SITE_OTHER): Payer: PPO

## 2020-08-20 DIAGNOSIS — M810 Age-related osteoporosis without current pathological fracture: Secondary | ICD-10-CM | POA: Diagnosis not present

## 2020-08-20 DIAGNOSIS — E538 Deficiency of other specified B group vitamins: Secondary | ICD-10-CM | POA: Diagnosis not present

## 2020-08-20 MED ORDER — DENOSUMAB 60 MG/ML ~~LOC~~ SOSY
60.0000 mg | PREFILLED_SYRINGE | Freq: Once | SUBCUTANEOUS | Status: AC
  Administered 2020-08-20: 60 mg via SUBCUTANEOUS

## 2020-08-20 MED ORDER — CYANOCOBALAMIN 1000 MCG/ML IJ SOLN
1000.0000 ug | Freq: Once | INTRAMUSCULAR | Status: AC
  Administered 2020-08-20: 1000 ug via INTRAMUSCULAR

## 2020-08-20 NOTE — Progress Notes (Signed)
Patient presented for B 12 injection to left deltoid and a Prolia Injection to right arm. Patient voiced no concerns nor showed any signs of distress during both injections.

## 2020-08-22 ENCOUNTER — Telehealth: Payer: Self-pay | Admitting: Internal Medicine

## 2020-08-22 NOTE — Telephone Encounter (Signed)
Spoke with pt's friend [name] and she stated that she is very concerned about Kathy [name]. She stated that the pt has not reached out to her in many months and over the last several weeks Kathy [name] has started texting her all hours of the day and night. She stated that the pt asked her if she could come and live with her because she could not take it at [employer] any longer. Ginny sated that she met pt yesterday for lunch and over heard a conversation between [place] and her boss, [name] was begging the boss to please give her a second chance because she does not want to be homeless. During this time while they were eating Ginny noticed that the pt seems different. She stated that Kathy [name] seems "lifeless, hopeless, has lost her sparkle". She also stated that when talking to [name] it seems like she is "not processing anything", she has had to repeat things to her multiple times. [name] stated that she hs reached out to a couple of places about housing for [facility] and they are just waiting on the information they need to apply. She has talked with Kathy [name] about this and she does not seem very fond of the idea but [place] said she could not come live with her. [name] stated that the pt has not told her that she has had any thought or wanted to hurt herself but [name] is scared that if Kathy [name] doesn't get help she is afraid she will make some very bad decisions. [name] does not know if pt has started back to drinking or if she is just severely depressed.

## 2020-08-22 NOTE — Telephone Encounter (Signed)
Kathy [name] a friend of the PT called to want to speak to [place] about some concerns she has for the PT wellbeing and safety. She would like to relay this to [place] asap.

## 2020-08-23 NOTE — Telephone Encounter (Signed)
noted 

## 2020-08-29 ENCOUNTER — Encounter: Payer: Self-pay | Admitting: *Deleted

## 2020-09-07 ENCOUNTER — Other Ambulatory Visit: Payer: Self-pay

## 2020-09-07 ENCOUNTER — Emergency Department: Payer: PPO

## 2020-09-07 ENCOUNTER — Emergency Department
Admission: EM | Admit: 2020-09-07 | Discharge: 2020-09-07 | Disposition: A | Discharge status: Home / Self Care | Payer: PPO | Attending: Emergency Medicine | Admitting: Emergency Medicine

## 2020-09-07 DIAGNOSIS — R1110 Vomiting, unspecified: Secondary | ICD-10-CM | POA: Diagnosis not present

## 2020-09-07 DIAGNOSIS — R1010 Upper abdominal pain, unspecified: Secondary | ICD-10-CM | POA: Diagnosis present

## 2020-09-07 DIAGNOSIS — Z79899 Other long term (current) drug therapy: Secondary | ICD-10-CM | POA: Diagnosis not present

## 2020-09-07 DIAGNOSIS — I10 Essential (primary) hypertension: Secondary | ICD-10-CM | POA: Insufficient documentation

## 2020-09-07 DIAGNOSIS — K219 Gastro-esophageal reflux disease without esophagitis: Secondary | ICD-10-CM | POA: Insufficient documentation

## 2020-09-07 DIAGNOSIS — F17210 Nicotine dependence, cigarettes, uncomplicated: Secondary | ICD-10-CM | POA: Insufficient documentation

## 2020-09-07 DIAGNOSIS — J449 Chronic obstructive pulmonary disease, unspecified: Secondary | ICD-10-CM | POA: Insufficient documentation

## 2020-09-07 DIAGNOSIS — R000 Tachycardia, unspecified: Secondary | ICD-10-CM | POA: Diagnosis not present

## 2020-09-07 DIAGNOSIS — R112 Nausea with vomiting, unspecified: Secondary | ICD-10-CM | POA: Diagnosis not present

## 2020-09-07 DIAGNOSIS — N3000 Acute cystitis without hematuria: Secondary | ICD-10-CM | POA: Insufficient documentation

## 2020-09-07 DIAGNOSIS — R110 Nausea: Secondary | ICD-10-CM | POA: Diagnosis not present

## 2020-09-07 DIAGNOSIS — R1011 Right upper quadrant pain: Secondary | ICD-10-CM | POA: Diagnosis not present

## 2020-09-07 DIAGNOSIS — K760 Fatty (change of) liver, not elsewhere classified: Secondary | ICD-10-CM | POA: Diagnosis not present

## 2020-09-07 DIAGNOSIS — R1111 Vomiting without nausea: Secondary | ICD-10-CM | POA: Diagnosis not present

## 2020-09-07 DIAGNOSIS — K8020 Calculus of gallbladder without cholecystitis without obstruction: Secondary | ICD-10-CM | POA: Diagnosis not present

## 2020-09-07 LAB — COMPREHENSIVE METABOLIC PANEL
ALT: 121 U/L — ABNORMAL HIGH (ref 0–44)
AST: 288 U/L — ABNORMAL HIGH (ref 15–41)
Albumin: 4.4 g/dL (ref 3.5–5.0)
Alkaline Phosphatase: 65 U/L (ref 38–126)
Anion gap: 25 — ABNORMAL HIGH (ref 5–15)
BUN: 8 mg/dL (ref 6–20)
CO2: 12 mmol/L — ABNORMAL LOW (ref 22–32)
Calcium: 7.3 mg/dL — ABNORMAL LOW (ref 8.9–10.3)
Chloride: 94 mmol/L — ABNORMAL LOW (ref 98–111)
Creatinine, Ser: 0.85 mg/dL (ref 0.44–1.00)
GFR, Estimated: >60 mL/min (ref >60)
Glucose, Bld: 118 mg/dL — ABNORMAL HIGH (ref 70–99)
Potassium: 3.7 mmol/L (ref 3.5–5.1)
Sodium: 131 mmol/L — ABNORMAL LOW (ref 135–145)
Total Bilirubin: 2 mg/dL — ABNORMAL HIGH (ref 0.3–1.2)
Total Protein: 7.3 g/dL (ref 6.5–8.1)

## 2020-09-07 LAB — URINALYSIS, COMPLETE (UACMP) WITH MICROSCOPIC
Bilirubin Urine: NEGATIVE
Glucose, UA: 50 mg/dL — AB
Ketones, ur: 80 mg/dL — AB
Nitrite: NEGATIVE
Protein, ur: 100 mg/dL — AB
Specific Gravity, Urine: 1.013 (ref 1.005–1.030)
pH: 5 (ref 5.0–8.0)

## 2020-09-07 LAB — CBC
HCT: 37.6 % (ref 36.0–46.0)
Hemoglobin: 13.1 g/dL (ref 12.0–15.0)
MCH: 34.7 pg — ABNORMAL HIGH (ref 26.0–34.0)
MCHC: 34.8 g/dL (ref 30.0–36.0)
MCV: 99.5 fL (ref 80.0–100.0)
Platelets: 120 10*3/uL — ABNORMAL LOW (ref 150–400)
RBC: 3.78 MIL/uL — ABNORMAL LOW (ref 3.87–5.11)
RDW: 14.3 % (ref 11.5–15.5)
WBC: 6.5 10*3/uL (ref 4.0–10.5)
nRBC: 0 % (ref 0.0–0.2)

## 2020-09-07 LAB — LIPASE, BLOOD: Lipase: 176 U/L — ABNORMAL HIGH (ref 11–51)

## 2020-09-07 MED ORDER — MORPHINE SULFATE (PF) 4 MG/ML IV SOLN
4.0000 mg | Freq: Once | INTRAVENOUS | Status: AC
  Administered 2020-09-07: 4 mg via INTRAVENOUS
  Filled 2020-09-07: qty 1

## 2020-09-07 MED ORDER — SODIUM CHLORIDE 0.9 % IV SOLN
12.5000 mg | Freq: Four times a day (QID) | INTRAVENOUS | Status: DC | PRN
  Administered 2020-09-07: 12.5 mg via INTRAVENOUS
  Filled 2020-09-07: qty 12.5

## 2020-09-07 MED ORDER — SODIUM CHLORIDE 0.9 % IV BOLUS
1000.0000 mL | Freq: Once | INTRAVENOUS | Status: AC
  Administered 2020-09-07: 1000 mL via INTRAVENOUS

## 2020-09-07 MED ORDER — LORAZEPAM 0.5 MG PO TABS: 0.5000 mg | ORAL_TABLET | Freq: Three times a day (TID) | ORAL | 0 refills | Status: DC | PRN

## 2020-09-07 MED ORDER — IOHEXOL 350 MG/ML SOLN
75.0000 mL | Freq: Once | INTRAVENOUS | Status: AC | PRN
  Administered 2020-09-07: 75 mL via INTRAVENOUS
  Filled 2020-09-07: qty 75

## 2020-09-07 MED ORDER — CEPHALEXIN 500 MG PO CAPS: 500.0000 mg | ORAL_CAPSULE | Freq: Four times a day (QID) | ORAL | 0 refills | Status: AC

## 2020-09-07 MED ORDER — MORPHINE SULFATE (PF) 4 MG/ML IV SOLN
4.0000 mg | Freq: Once | INTRAVENOUS | Status: AC
Start: 2020-09-07 — End: 2020-09-07
  Administered 2020-09-07: 4 mg via INTRAVENOUS
  Filled 2020-09-07: qty 1

## 2020-09-07 MED ORDER — LORAZEPAM 2 MG/ML IJ SOLN
0.5000 mg | Freq: Once | INTRAMUSCULAR | Status: AC
  Administered 2020-09-07: 0.5 mg via INTRAVENOUS
  Filled 2020-09-07: qty 1

## 2020-09-07 NOTE — ED Triage Notes (Signed)
Pt states has had vomiting and nausea for two days. Pt denies diarrhea. Pt received 547mL ns bolus and 4mg  of zofran from ems. Pt with dry mouth.

## 2020-09-07 NOTE — Discharge Instructions (Addendum)
Please take the medications prescribed for your nausea/vomiting and possible UTI. Return to the ER for any worsening of your symptoms.

## 2020-09-07 NOTE — ED Provider Notes (Signed)
[facility] Emergency Department Provider Note  ____________________________________________   Event Date/Time   First MD Initiated Contact with Patient 09/07/20 0725     (approximate)  I have reviewed the triage vital signs and the nursing notes.   HISTORY  Chief Complaint Vomiting  HPI Kathy [name] is a 60 y.o. female who presents to the emergency department for evaluation of nausea and vomiting for the last 2 days.  Patient has a history of alcohol abuse, reports she has been in remission for the last 5 years.  She states that 2 days ago due to stressors at work she relapsed and drank 2 to 3 glasses of wine.  Since that time, she has been feeling unwell, beginning with nausea and vomiting and progressing to upper abdominal pain that she feels is associated with her vomiting.  She denies any hematemesis, denies any constipation or diarrhea.  She denies any fevers, dysuria or back pain.  She denies any chest pain, shortness of breath.  She reports that she is usually on Librium, however was unable to tolerate this p.o. yesterday secondary to her nausea and vomiting.         Past Medical History:  Diagnosis Date   Abnormal weight loss    Alcohol abuse    Anxiety    Chicken pox    Colon polyps    COPD (chronic obstructive pulmonary disease) (HCC)    Depression    GERD (gastroesophageal reflux disease)    Hernia of abdominal cavity    Hip fracture (HCC) right   Neuropathy     Patient Active Problem List   Diagnosis Date Noted   Constipation 05/21/2020   Dysuria 05/21/2020   Mild malnutrition ([place]) 05/20/2020   Mild episode of recurrent major depressive disorder ([place]) 05/20/2020   Essential hypertension 10/03/2019   Insomnia due to anxiety and fear 09/29/2019   GAD (generalized anxiety disorder) 09/19/2018   Poor dentition 05/18/2018   Tobacco abuse 02/22/2018   Atrophy of left kidney 02/22/2018   Diverticulosis of colon without  diverticulitis 02/22/2018   MDD (major depressive disorder) 09/13/2017   Osteoporosis 10/17/2016   Prediabetes 10/17/2016   Pernicious anemia 01/01/2016   Hyponatremia 09/10/2015   [place]amm[place]oe 78/46/9629   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 Authorizing Provider  cephALEXin (KEFLEX) 500 MG capsule Take 1 capsule (500 mg total) by mouth 4 (four) times daily for 10 days. 09/07/20 09/17/20 Yes Falana Clagg, Farrel Gordon, PA  LORazepam (ATIVAN) 0.5 MG tablet Take 1 tablet (0.5 mg total) by mouth every 8 (eight) hours as needed for up to 3 days (for vomiting). 09/07/20 09/10/20 Yes Elyanna Wallick, Farrel Gordon, PA  acetaminophen (TYLENOL) 500 MG tablet Take 500 mg by mouth every 6 (six) hours as needed.    [provider]  amLODipine (NORVASC) 10 MG tablet Take by mouth.    [provider]  chlordiazePOXIDE (LIBRIUM) 10 MG capsule Take 1 capsule (10 mg total) by mouth 4 (four) times daily as needed for withdrawal. 08/22/20   Crecencio Mc, MD  chlordiazePOXIDE (LIBRIUM) 25 MG capsule Take 1 capsule (25 mg total) by mouth in the morning and at bedtime. 07/18/20  Crecencio Mc, MD  cyanocobalamin (,VITAMIN B-12,) 1000 MCG/ML injection Inject 1 mL (1,000 mcg total) into the muscle every 14 (fourteen) days. 06/21/19   Crecencio Mc, MD  cyclobenzaprine (FLEXERIL) 10 MG tablet Take 1 tablet (10 mg total) by mouth 3 (three) times daily as needed for muscle spasms. Involving the legs and feet 06/18/20   Crecencio Mc, MD  dicyclomine (BENTYL) 20 MG tablet Take 1 tablet (20 mg total) by mouth every 6 (six) hours as needed for spasms. 12/30/15   Crecencio Mc, MD  DULoxetine HCl 40 MG CPEP  Take 40 mg by mouth daily. 09/29/19   Crecencio Mc, MD  gabapentin (NEURONTIN) 400 MG capsule Take 2 capsules (800 mg total) by mouth 3 (three) times daily. 12/01/18   Bacigalupo, Dionne Bucy, MD  hydrOXYzine (VISTARIL) 50 MG capsule Take 1 capsule (50 mg total) by mouth 3 (three) times daily as needed. 06/05/20   Crecencio Mc, MD  omeprazole (PRILOSEC) 40 MG capsule Take 1 capsule (40 mg total) by mouth 2 (two) times daily. 05/18/19   Virginia Crews, MD  Syringe/Needle, Disp, (SYRINGE 3CC/25GX1") 25G X 1" 3 ML MISC Use for b12 injections 06/21/19   Crecencio Mc, MD  traMADol (ULTRAM) 50 MG tablet TAKE ONE TABLET BY MOUTH  2 times daily as NEEDED  For neuropathy 05/20/20   Crecencio Mc, MD  traZODone (DESYREL) 100 MG tablet 1/2 to 1 tablet 30 to 60 minutes before bedtime 05/20/20   Crecencio Mc, MD    Allergies Aspirin and Lyrica [pregabalin]  Family History  Problem Relation Age of Onset   Hypertension Mother    Osteoporosis Mother    Meniere's disease Mother    Cancer Father 35       prostate   Colon cancer Paternal Grandmother    Alcohol abuse Other     Social History Social History   Tobacco Use   Smoking status: Every Day    Packs/day: 0.50    Years: 40.00    Pack years: 20.00    Types: Cigarettes   Smokeless tobacco: Never   Tobacco comments:    smokes one pack/week currently  Vaping Use   Vaping Use: Never used  Substance Use Topics   Alcohol use: Not Currently    Alcohol/week: 42.0 standard drinks    Types: 42 Glasses of wine per week    Comment: since late April    Drug use: No    Review of Systems Constitutional: No fever/chills Eyes: No visual changes. ENT: No sore throat. Cardiovascular: Denies chest pain. Respiratory: Denies shortness of breath. Gastrointestinal: + abdominal pain.  + nausea, + vomiting.  No diarrhea.  No constipation. Genitourinary: Negative for dysuria. Musculoskeletal: Negative for back pain. Skin: Negative for  rash. Neurological: Negative for headaches, focal weakness or numbness.  ____________________________________________   PHYSICAL EXAM:  VITAL SIGNS: ED Triage Vitals  Enc Vitals Group     BP 09/07/20 0445 134/76     Pulse Rate 09/07/20 0445 (!) 134     Resp 09/07/20 0445 20     Temp 09/07/20 0445 98 F (36.7 C)     Temp Source 09/07/20 0445 Oral     SpO2 09/07/20 0445 99 %     Weight 09/07/20 0445 107 lb (48.5 kg)     Height 09/07/20 0445 5\' 2"  (1.575 m)     Head Circumference --      Peak Flow --  Pain Score 09/07/20 1054 2     Pain Loc --      Pain Edu? --      Excl. in [place]? --    Constitutional: Alert and oriented. Well appearing and in no acute distress. Eyes: Conjunctivae are normal. PERRL. EOMI. Head: Atraumatic. Nose: No congestion/rhinnorhea. Mouth/Throat: Mucous membranes are dry.  Oropharynx nonerythematous. Neck: No stridor.   Cardiovascular: Tachycardic, regular rhythm. Grossly normal heart sounds.  Good peripheral circulation. Respiratory: Normal respiratory effort.  No retractions. Lungs CTAB. Gastrointestinal: Tenderness to palpation of the right upper quadrant and epigastric region.  No guarding or rebound.  No distention. No abdominal bruits. No CVA tenderness. Musculoskeletal: No lower extremity tenderness nor edema.  No joint effusions. Neurologic:  Normal speech and language. No gross focal neurologic deficits are appreciated. No gait instability. Skin:  Skin is warm, dry and intact. No rash noted. Psychiatric: Mood and affect are normal. Speech and behavior are normal.  ____________________________________________   LABS (all labs ordered are listed, but only abnormal results are displayed)  Labs Reviewed  CBC - Abnormal; Notable for the following components:      Result Value   RBC 3.78 (*)    MCH 34.7 (*)    Platelets 120 (*)    All other components within normal limits  URINALYSIS, COMPLETE (UACMP) WITH MICROSCOPIC - Abnormal; Notable  for the following components:   Color, Urine YELLOW (*)    APPearance HAZY (*)    Glucose, UA 50 (*)    Hgb urine dipstick MODERATE (*)    Ketones, ur 80 (*)    Protein, ur 100 (*)    Leukocytes,Ua LARGE (*)    Bacteria, UA RARE (*)    All other components within normal limits  COMPREHENSIVE METABOLIC PANEL - Abnormal; Notable for the following components:   Sodium 131 (*)    Chloride 94 (*)    CO2 12 (*)    Glucose, Bld 118 (*)    Calcium 7.3 (*)    AST 288 (*)    ALT 121 (*)    Total Bilirubin 2.0 (*)    Anion gap 25 (*)    All other components within normal limits  LIPASE, BLOOD - Abnormal; Notable for the following components:   Lipase 176 (*)    All other components within normal limits  URINE CULTURE   ____________________________________________  EKG  Sinus tachycardia with a rate of 126.  Artifact present on EKG.  QTc with borderline prolongation at 475.  No ST elevations to suggest acute ischemia. ____________________________________________  RADIOLOGY  Official radiology report(s): CT ABDOMEN PELVIS W CONTRAST  Result Date: 09/07/2020 CLINICAL DATA:  Nausea and vomiting EXAM: CT ABDOMEN AND PELVIS WITH CONTRAST TECHNIQUE: Multidetector CT imaging of the abdomen and pelvis was performed using the standard protocol following bolus administration of intravenous contrast. CONTRAST:  55mL OMNIPAQUE IOHEXOL 350 MG/ML SOLN COMPARISON:  CT 03/14/2014 FINDINGS: Lower chest: Lung bases are clear. Hepatobiliary: No focal hepatic lesion. No biliary duct dilatation. Common bile duct is normal. Pancreas: Pancreas is normal. No ductal dilatation. No pancreatic inflammation. Spleen: Normal spleen Adrenals/urinary tract: Adrenal glands normal. The LEFT kidney is severely atrophic. There is chronic dilatation of the LEFT renal pelvis. Favor chronic UPJ obstruction. The RIGHT kidney enhances uniformly. Delayed imaging demonstrates excretion of IV contrast from the RIGHT kidney. Minimal  function of the LEFT kidney. Bladder is distended. Stomach/Bowel: Hernia sac adjacent to the distal esophagus. Gastric hernia extends adjacent to the GE junction suggesting  a paraesophageal hernia (image 45/series 6). There is no fluid in the distal esophagus. The hernia pattern/anatomy is similar on comparison CT from 2016. Stomach is decompressed. No evidence of gastric outlet obstruction. The duodenum and small bowel normal. No evidence of bowel obstruction. Appendix is normal. Multiple diverticula of the descending colon and sigmoid colon without acute inflammation. Vascular/Lymphatic: Abdominal aorta is normal caliber with atherosclerotic calcification. There is no retroperitoneal or periportal lymphadenopathy. No pelvic lymphadenopathy. Reproductive: Uterus and adnexa unremarkable. Other: No free fluid. Musculoskeletal: No aggressive osseous lesion. IMPRESSION: 1. No evidence of bowel obstruction or gastric outlet obstruction. 2. Moderately complex gastric hernia with a paraesophageal component. This potentially could relate to patient's vomiting. There is a similar hernia pattern on CT from 2016. 3. 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 Normal RIGHT kidney. 4. Normal appendix. 5. LEFT colon diverticulosis/diverticulitis. Electronically Signed   By: Suzy Bouchard M.D.   On: 09/07/2020 11:08   US Abdomen Limited RUQ (LIVER/GB)  Result Date: 09/07/2020 CLINICAL DATA:  Right upper quadrant pain for 48 hours EXAM: ULTRASOUND ABDOMEN LIMITED RIGHT UPPER QUADRANT COMPARISON:  None. FINDINGS: Gallbladder: Round echogenic nonmobile focus without shadowing along the gallbladder wall measures 0.8 x 0.7 x 0.5 cm. The structure is modestly echogenic but not significantly hyperechoic. Additionally, multiple mobile echogenic foci with posterior acoustic shadowing are present within the gallbladder lumen consistent with cholelithiasis. The stones are layering with a small amount of  heterogeneous echogenic material consistent with sludge. No evidence of gallbladder wall thickening or pericholecystic fluid. Common bile duct: Diameter: Normal at 3 mm Liver: Increased echogenicity of the hepatic parenchyma with coarsening of the echotexture. No discrete hepatic lesion. Normal contour. Portal vein is patent on color Doppler imaging with normal direction of blood flow towards the liver. Other: None. IMPRESSION: 1. Cholelithiasis without secondary sonographic findings of acute cholecystitis. 2. Small 0.8 mm nonmobile echogenic focus adherent to the gallbladder wall. Differential considerations include benign polyp, adenoma, or early gallbladder cancer. Recommend follow-up with ultrasound in 1 year. If there is evidence of growth at that time, then surgical consultation would be recommended. This recommendation follows ACR consensus guidelines: White Paper of the ACR Incidental Findings Committee II on Gallbladder and Biliary Findings. J Am Coll Radiol 2013:;10:953-956. 3. Hepatic steatosis. Electronically Signed   By: Jacqulynn Cadet M.D.   On: 09/07/2020 09:52      ____________________________________________   INITIAL IMPRESSION / ASSESSMENT AND PLAN / ED COURSE  As part of my medical decision making, I reviewed the following data within the [place] notes reviewed and incorporated, Labs reviewed, Evaluated by EM attending Dr. Corky Downs, Notes from prior ED visits, and [place] Controlled Substance Database        Patient is a 60 year old female with history of alcohol abuse in remission for the last 5 years with relapse 2 days ago who presents to the emergency department for evaluation of persistent nausea and vomiting over the last 2 days.  Denies any dysuria, fevers.  States she has been unable to keep anything down.  See HPI for further details.  In triage, patient is tachycardic with a rate of 134, otherwise normal vital signs.  On physical exam, patient  does appear mildly dehydrated.  She also has tenderness to palpation of the right upper quadrant and epigastric region without any CVA tenderness or other tenderness present.  Will begin evaluation with CBC, CMP, urinalysis, lipase.  CBC with mild thrombocytopenia, otherwise without any significant abnormalities.  CMP with a hyponatremia of 131, hypochloremia of 94, hypocarbia of 12, AST of 288, ALT of 121, total bili of 2.0 and anion gap of 25.  Urinalysis with moderate hemoglobin, ketonuria, proteinuria, large leukocytes with rare bacteria.  We will send this for culture.  Lipase elevated at 176.  Differentials considered include cholecystitis, choledocholithiasis, pancreatitis, UTI, pyelonephritis, bowel obstruction, other  Given patient's presentation, right upper quadrant ultrasound was obtained and demonstrates cholelithiasis without any findings of acute cholecystitis.  There is also a region suspicious for adenoma, benign polyp or early gallbladder cancer, recommending ultrasound follow-up in 1 year.  Given no acute cholecystitis found on right upper quadrant ultrasound, CT imaging was obtained with IV contrast which demonstrates severe left kidney atrophy likely representing chronic UPJ obstruction, esophageal hernia without any acute findings, left colon diverticulosis without any acute inflammation.  Patient in the ER has received IV fluids, vomiting was difficult to control with Zofran as well as Phenergan, but did significantly improve with IV Ativan.  Pain significantly improved with dose of morphine.  At this time, discussed with the patient options including admission given her mildly elevated lipase and intractable vomiting versus discharge and watching at home.  Patient would like to attempt discharge but does understand that she will have to return if she has any severe worsening or return of her symptoms.  Patient has also been seen and examined by attending Dr. Corky Downs who is in  agreement with plan of care, and to recommends discharge with 10 tablets of half milligram Ativan every 8 hours for her vomiting.  Patient has tolerated p.o. here in our facility without new vomiting, and is stable for discharge at this time.      ____________________________________________   FINAL CLINICAL IMPRESSION(S) / ED DIAGNOSES  Final diagnoses:  RUQ pain  Intractable vomiting with nausea, unspecified vomiting type  Acute cystitis without hematuria     ED Discharge Orders          Ordered    LORazepam (ATIVAN) 0.5 MG tablet  Every 8 hours PRN        09/07/20 1249    cephALEXin (KEFLEX) 500 MG capsule  4 times daily        09/07/20 1249             Note:  This document was prepared using Dragon voice recognition software and may include unintentional dictation errors.    Marlana Salvage, PA 09/07/20 1501    Lavonia Drafts, MD 09/07/20 [phone]

## 2020-09-07 NOTE — ED Notes (Signed)
Pt given gingerale and crackers for PO challenge

## 2020-09-09 ENCOUNTER — Encounter: Payer: Self-pay | Admitting: Internal Medicine

## 2020-09-09 ENCOUNTER — Telehealth: Payer: Self-pay | Admitting: Internal Medicine

## 2020-09-09 DIAGNOSIS — K8020 Calculus of gallbladder without cholecystitis without obstruction: Secondary | ICD-10-CM | POA: Insufficient documentation

## 2020-09-09 DIAGNOSIS — E872 Acidosis, unspecified: Secondary | ICD-10-CM

## 2020-09-09 DIAGNOSIS — K828 Other specified diseases of gallbladder: Secondary | ICD-10-CM | POA: Insufficient documentation

## 2020-09-09 DIAGNOSIS — K7010 Alcoholic hepatitis without ascites: Secondary | ICD-10-CM | POA: Insufficient documentation

## 2020-09-09 DIAGNOSIS — K449 Diaphragmatic hernia without obstruction or gangrene: Secondary | ICD-10-CM | POA: Insufficient documentation

## 2020-09-09 HISTORY — DX: Alcoholic hepatitis without ascites: K70.10

## 2020-09-09 HISTORY — DX: Other specified diseases of gallbladder: K82.8

## 2020-09-09 NOTE — Telephone Encounter (Signed)
Patient called and went to the ED on 09/07/2020. She would like Dr Derrel Nip to look over ED visit. Patient stated that  she only has one kidney working now. Patient has been scheduled on 09/10/20 virtual ED follow up.

## 2020-09-09 NOTE — Progress Notes (Deleted)
Virtual Visit via [place] Note  This visit type was conducted due to national recommendations for restrictions regarding the COVID-19 pandemic (e.g. social distancing).  This format is felt to be most appropriate for this patient at this time.  All issues noted in this document were discussed and addressed.  No physical exam was performed (except for noted visual exam findings with Video Visits).   I connected withNAME@ on 09/09/20 at  1:30 PM EDT by a video enabled telemedicine application or telephone and verified that I am speaking with the correct person using two identifiers. Location patient: home Location provider: work or home office Persons participating in the virtual visit: patient, provider  I discussed the limitations, risks, security and privacy concerns of performing an evaluation and management service by telephone and the availability of in person appointments. I also discussed with the patient that there may be a patient responsible charge related to this service. The patient expressed understanding and agreed to proceed.  Interactive audio and video telecommunications were attempted between this provider and patient, however failed, due to patient having technical difficulties OR patient did not have access to video capability.  We continued and completed visit with audio only. ***  Reason for visit: ER Follow up HPI:  60 YR OLD FEMALE with history of chronic alcohol abuse,  recent recidivism triggered by economic stressors, presents for ER follow up on July 16 for intractible nausea and vomiting . Labs noted transaminitis with AST 2 x > ALT,  elevated lipase of 173  , hyponatremia, and severe  gap metabolic acidosis .  Not ETOH or tox screen was done.   Acidosis was also present during June ER evaluation for chest pain.  CT abd pelvis and abd U/s were done.  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  Past Medical History:  Diagnosis Date   Abnormal weight loss    Alcohol abuse    Anxiety    Chicken pox    Colon polyps    COPD (chronic obstructive pulmonary disease) (HCC)    Depression    GERD (gastroesophageal reflux disease)    Hernia of abdominal cavity    Hip fracture (HCC) right   Neuropathy     Past Surgical History:  Procedure Laterality Date   NO PAST SURGERIES      Family History  Problem Relation Age of Onset   Hypertension Mother    Osteoporosis Mother    Meniere's disease Mother    Cancer Father 54       prostate   Colon cancer Paternal Grandmother    Alcohol abuse Other     SOCIAL HX: ***   Current Outpatient Medications:    acetaminophen (TYLENOL) 500 MG tablet, Take 500 mg by mouth every 6 (six) hours as needed., Disp: , Rfl:    amLODipine (NORVASC) 10 MG tablet, Take by mouth., Disp: , Rfl:    cephALEXin (KEFLEX) 500 MG capsule, Take 1 capsule (500 mg total) by mouth 4 (four) times daily for 10 days., Disp: 40 capsule, Rfl: 0   chlordiazePOXIDE (LIBRIUM) 10 MG capsule, Take 1 capsule (10 mg total) by mouth 4 (four) times daily as needed for withdrawal., Disp: 28 capsule, Rfl: 0   chlordiazePOXIDE (LIBRIUM) 25 MG capsule, Take 1 capsule (25 mg total) by mouth in the morning  and at bedtime., Disp: 14 capsule, Rfl: 0   cyanocobalamin (,VITAMIN B-12,) 1000 MCG/ML injection, Inject 1 mL (1,000 mcg total) into the muscle every 14 (fourteen) days., Disp: 6 mL, Rfl: 3   cyclobenzaprine (FLEXERIL) 10 MG tablet, Take 1 tablet (10 mg total) by mouth 3 (three) times daily as needed for muscle spasms. Involving the legs and feet, Disp: 90 tablet, Rfl: 1   dicyclomine (BENTYL) 20 MG tablet, Take 1 tablet (20 mg total) by mouth every 6 (six) hours as needed for spasms., Disp: 120 tablet, Rfl: 5   DULoxetine HCl 40 MG CPEP, Take 40 mg by mouth daily.,  Disp: 30 capsule, Rfl: 5   gabapentin (NEURONTIN) 400 MG capsule, Take 2 capsules (800 mg total) by mouth 3 (three) times daily., Disp: 180 capsule, Rfl: 5   hydrOXYzine (VISTARIL) 50 MG capsule, Take 1 capsule (50 mg total) by mouth 3 (three) times daily as needed., Disp: 90 capsule, Rfl: 0   LORazepam (ATIVAN) 0.5 MG tablet, Take 1 tablet (0.5 mg total) by mouth every 8 (eight) hours as needed for up to 3 days (for vomiting)., Disp: 10 tablet, Rfl: 0   omeprazole (PRILOSEC) 40 MG capsule, Take 1 capsule (40 mg total) by mouth 2 (two) times daily., Disp: 180 capsule, Rfl: 1   Syringe/Needle, Disp, (SYRINGE 3CC/25GX1") 25G X 1" 3 ML MISC, Use for b12 injections, Disp: 50 each, Rfl: 0   traMADol (ULTRAM) 50 MG tablet, TAKE ONE TABLET BY MOUTH  2 times daily as NEEDED  For neuropathy, Disp: 60 tablet, Rfl: 5   traZODone (DESYREL) 100 MG tablet, 1/2 to 1 tablet 30 to 60 minutes before bedtime, Disp: 90 tablet, Rfl: 1  Current Facility-Administered Medications:    cyanocobalamin ((VITAMIN B-12)) injection 1,000 mcg, 1,000 mcg, Intramuscular, Q14 Days, Crecencio Mc, MD, 1,000 mcg at 05/20/20 1519  EXAM:  VITALS per patient if applicable:  GENERAL: alert, oriented, appears well and in no acute distress  HEENT: atraumatic, conjunttiva clear, no obvious abnormalities on inspection of external nose and ears  NECK: normal movements of the head and neck  LUNGS: on inspection no signs of respiratory distress, breathing rate appears normal, no obvious gross SOB, gasping or wheezing  CV: no obvious cyanosis  MS: moves all visible extremities without noticeable abnormality  PSYCH/NEURO: pleasant and cooperative, no obvious depression or anxiety, speech and thought processing grossly intact  ASSESSMENT AND PLAN:  Discussed the following assessment and plan:  No diagnosis found.  No problem-specific Assessment & Plan notes found for this encounter.    I discussed the assessment and treatment  plan with the patient. The patient was provided an opportunity to ask questions and all were answered. The patient agreed with the plan and demonstrated an understanding of the instructions.   The patient was advised to call back or seek an in-person evaluation if the symptoms worsen or if the condition fails to improve as anticipated.   I spent 30 minutes dedicated to the care of this patient on the date of this encounter to include pre-visit review of his medical history,  Face-to-face time with the patient , and post visit ordering of testing and therapeutics.    Crecencio Mc, MD

## 2020-09-09 NOTE — Telephone Encounter (Signed)
Kathy [name] returned the call. Gave provider message and Kathy [name] verbalized understanding and had no further questions. Kathy [name] states that she has not touched any alcohol and has went to counseling to stop her alcohol use. Pt states that the tramadol medication was not working and that she was given Lorazepam while in the hospital and that it helps keep her calm. Kathy [name] declined scheduling an appointment for a repeat BMET and asks to wait until her appointment with Dr. Derrel Nip on 09/10/20 due to having labs already drawn on 09/07/20. Pt wanted to go over the labs with Dr. Derrel Nip during the appointment and discuss the repeat lab and the Lorazepam medication.

## 2020-09-09 NOTE — Telephone Encounter (Signed)
LMTCB

## 2020-09-09 NOTE — Addendum Note (Signed)
Addended by: Crecencio Mc on: 09/09/2020 02:38 PM   Modules accepted: Orders

## 2020-09-10 ENCOUNTER — Telehealth: Payer: PPO | Admitting: Internal Medicine

## 2020-09-10 LAB — URINE CULTURE: Culture: 70000 — AB

## 2020-09-10 MED ORDER — LORAZEPAM 0.5 MG PO TABS: 0.5000 mg | ORAL_TABLET | Freq: Every day | ORAL | 2 refills | Status: AC | PRN

## 2020-09-10 NOTE — Telephone Encounter (Signed)
Spoke with pt and she stated that she is not taking the Librium any more. She stated that she was weaned off of this medication. She also wanted to let Dr. Derrel Nip know that she was unable to keep her appt today because she had to take her dad to the ED because he has shingles and they have gotten in his eye.

## 2020-09-10 NOTE — Addendum Note (Signed)
Addended by: Crecencio Mc on: 09/10/2020 05:13 PM   Modules accepted: Orders

## 2020-09-10 NOTE — Telephone Encounter (Signed)
Noted  

## 2020-09-10 NOTE — Telephone Encounter (Signed)
Lorazepam has been refilled for ONCE DAILY USE .  NEEDS APPT RESCHEDULED

## 2020-09-11 NOTE — Telephone Encounter (Signed)
LMTCB

## 2020-09-17 NOTE — Telephone Encounter (Signed)
LMTCB

## 2020-09-19 NOTE — Telephone Encounter (Signed)
LMTCB

## 2020-09-20 ENCOUNTER — Other Ambulatory Visit: Payer: Self-pay | Admitting: Internal Medicine

## 2020-09-23 NOTE — Telephone Encounter (Signed)
Mailed an unable to reach letter.

## 2020-09-23 NOTE — Telephone Encounter (Signed)
LMTCB

## 2020-09-28 ENCOUNTER — Other Ambulatory Visit: Payer: Self-pay

## 2020-09-28 ENCOUNTER — Emergency Department
Admission: EM | Admit: 2020-09-28 | Discharge: 2020-09-28 | Disposition: A | Discharge status: Home / Self Care | Payer: PPO | Attending: Emergency Medicine | Admitting: Emergency Medicine

## 2020-09-28 ENCOUNTER — Emergency Department: Payer: PPO

## 2020-09-28 DIAGNOSIS — R112 Nausea with vomiting, unspecified: Secondary | ICD-10-CM | POA: Diagnosis not present

## 2020-09-28 DIAGNOSIS — R531 Weakness: Secondary | ICD-10-CM | POA: Insufficient documentation

## 2020-09-28 DIAGNOSIS — K449 Diaphragmatic hernia without obstruction or gangrene: Secondary | ICD-10-CM | POA: Diagnosis not present

## 2020-09-28 DIAGNOSIS — R110 Nausea: Secondary | ICD-10-CM | POA: Diagnosis not present

## 2020-09-28 DIAGNOSIS — R000 Tachycardia, unspecified: Secondary | ICD-10-CM | POA: Diagnosis not present

## 2020-09-28 DIAGNOSIS — Z5321 Procedure and treatment not carried out due to patient leaving prior to being seen by health care provider: Secondary | ICD-10-CM | POA: Insufficient documentation

## 2020-09-28 LAB — BASIC METABOLIC PANEL
Anion gap: 19 — ABNORMAL HIGH (ref 5–15)
BUN: 10 mg/dL (ref 6–20)
CO2: 15 mmol/L — ABNORMAL LOW (ref 22–32)
Calcium: 8.3 mg/dL — ABNORMAL LOW (ref 8.9–10.3)
Chloride: 95 mmol/L — ABNORMAL LOW (ref 98–111)
Creatinine, Ser: 0.71 mg/dL (ref 0.44–1.00)
GFR, Estimated: >60 mL/min (ref >60)
Glucose, Bld: 89 mg/dL (ref 70–99)
Potassium: 4 mmol/L (ref 3.5–5.1)
Sodium: 129 mmol/L — ABNORMAL LOW (ref 135–145)

## 2020-09-28 LAB — CBC
HCT: 37.9 % (ref 36.0–46.0)
Hemoglobin: 13.2 g/dL (ref 12.0–15.0)
MCH: 36.2 pg — ABNORMAL HIGH (ref 26.0–34.0)
MCHC: 34.8 g/dL (ref 30.0–36.0)
MCV: 103.8 fL — ABNORMAL HIGH (ref 80.0–100.0)
Platelets: 127 10*3/uL — ABNORMAL LOW (ref 150–400)
RBC: 3.65 MIL/uL — ABNORMAL LOW (ref 3.87–5.11)
RDW: 15.6 % — ABNORMAL HIGH (ref 11.5–15.5)
WBC: 5.6 10*3/uL (ref 4.0–10.5)
nRBC: 0 % (ref 0.0–0.2)

## 2020-09-28 LAB — TROPONIN I (HIGH SENSITIVITY): Troponin I (High Sensitivity): 9 ng/L (ref <18)

## 2020-09-28 MED ORDER — ONDANSETRON HCL 4 MG/2ML IJ SOLN
4.0000 mg | Freq: Once | INTRAMUSCULAR | Status: DC
  Filled 2020-09-28: qty 2

## 2020-09-28 MED ORDER — SODIUM CHLORIDE 0.9 % IV BOLUS: 1000.0000 mL | Freq: Once | INTRAVENOUS | Status: DC

## 2020-09-28 NOTE — ED Notes (Signed)
Pt declines to have any "more sticks" for iv insertion. Pt informed may get iv team consult. Pt declines at this time. Pt states she wants to go home. Will notify md.

## 2020-09-28 NOTE — ED Notes (Signed)
md notified pt wanted to leave. md states pt will need to sign out ama. Went back into room to sign pt out ama and pt was no longer in room.

## 2020-09-28 NOTE — ED Triage Notes (Signed)
Pt brought in by [facility], N/V/D weakness for the last few days. VSS

## 2020-09-28 NOTE — ED Triage Notes (Signed)
Pt via EMS from home. Pt c/o weakness, nausea, and vomiting. Pt also c/o mid-sternal CP and SOB. Pt states she have been sober off alcohol for 5 years but drink for the first time this AM, pt states she drank 3 "vodka drinks." Pt is A&OX4 and NAD.

## 2020-09-28 NOTE — ED Provider Notes (Signed)
Emergency department brief note  Patient presents to the emergency department for weakness/nausea/vomiting in the setting of relapse of alcoholism.  Patient had IV fluids as well as Zofran ordered given work-up and patient's description of symptoms.  Per nursing staff, patient declines having any further intervention at this time and that she wants to go home.  Prior to my assessment patient was given AMA paperwork by our nursing staff that was signed prior to discharge   Naaman Plummer, MD 09/28/20 2240

## 2020-10-13 ENCOUNTER — Other Ambulatory Visit: Payer: Self-pay

## 2020-10-13 ENCOUNTER — Observation Stay
Admission: EM | Admit: 2020-10-13 | Discharge: 2020-10-14 | Disposition: A | Discharge status: Home / Self Care | Payer: PPO | Attending: Internal Medicine | Admitting: Internal Medicine

## 2020-10-13 DIAGNOSIS — F10139 Alcohol abuse with withdrawal, unspecified: Secondary | ICD-10-CM

## 2020-10-13 DIAGNOSIS — I10 Essential (primary) hypertension: Secondary | ICD-10-CM | POA: Insufficient documentation

## 2020-10-13 DIAGNOSIS — F10130 Alcohol abuse with withdrawal, uncomplicated: Secondary | ICD-10-CM | POA: Diagnosis not present

## 2020-10-13 DIAGNOSIS — Z79899 Other long term (current) drug therapy: Secondary | ICD-10-CM | POA: Insufficient documentation

## 2020-10-13 DIAGNOSIS — J449 Chronic obstructive pulmonary disease, unspecified: Secondary | ICD-10-CM | POA: Insufficient documentation

## 2020-10-13 DIAGNOSIS — R1084 Generalized abdominal pain: Secondary | ICD-10-CM | POA: Diagnosis not present

## 2020-10-13 DIAGNOSIS — F10230 Alcohol dependence with withdrawal, uncomplicated: Secondary | ICD-10-CM | POA: Diagnosis not present

## 2020-10-13 DIAGNOSIS — F10930 Alcohol use, unspecified with withdrawal, uncomplicated: Secondary | ICD-10-CM

## 2020-10-13 DIAGNOSIS — F17210 Nicotine dependence, cigarettes, uncomplicated: Secondary | ICD-10-CM | POA: Insufficient documentation

## 2020-10-13 DIAGNOSIS — Z20822 Contact with and (suspected) exposure to covid-19: Secondary | ICD-10-CM | POA: Insufficient documentation

## 2020-10-13 DIAGNOSIS — F10239 Alcohol dependence with withdrawal, unspecified: Secondary | ICD-10-CM | POA: Diagnosis not present

## 2020-10-13 DIAGNOSIS — R000 Tachycardia, unspecified: Secondary | ICD-10-CM | POA: Diagnosis not present

## 2020-10-13 DIAGNOSIS — R1111 Vomiting without nausea: Secondary | ICD-10-CM | POA: Diagnosis not present

## 2020-10-13 DIAGNOSIS — D696 Thrombocytopenia, unspecified: Secondary | ICD-10-CM | POA: Diagnosis present

## 2020-10-13 DIAGNOSIS — Y900 Blood alcohol level of less than 20 mg/100 ml: Secondary | ICD-10-CM | POA: Diagnosis not present

## 2020-10-13 DIAGNOSIS — R7401 Elevation of levels of liver transaminase levels: Secondary | ICD-10-CM | POA: Insufficient documentation

## 2020-10-13 DIAGNOSIS — R52 Pain, unspecified: Secondary | ICD-10-CM | POA: Diagnosis not present

## 2020-10-13 DIAGNOSIS — F10929 Alcohol use, unspecified with intoxication, unspecified: Secondary | ICD-10-CM | POA: Diagnosis not present

## 2020-10-13 DIAGNOSIS — R112 Nausea with vomiting, unspecified: Secondary | ICD-10-CM | POA: Diagnosis present

## 2020-10-13 DIAGNOSIS — F329 Major depressive disorder, single episode, unspecified: Secondary | ICD-10-CM | POA: Diagnosis present

## 2020-10-13 DIAGNOSIS — R7989 Other specified abnormal findings of blood chemistry: Secondary | ICD-10-CM | POA: Diagnosis present

## 2020-10-13 HISTORY — DX: Alcohol abuse with withdrawal, unspecified: F10.139

## 2020-10-13 LAB — CBC
HCT: 37.1 % (ref 36.0–46.0)
Hemoglobin: 13 g/dL (ref 12.0–15.0)
MCH: 36.9 pg — ABNORMAL HIGH (ref 26.0–34.0)
MCHC: 35 g/dL (ref 30.0–36.0)
MCV: 105.4 fL — ABNORMAL HIGH (ref 80.0–100.0)
Platelets: 76 10*3/uL — ABNORMAL LOW (ref 150–400)
RBC: 3.52 MIL/uL — ABNORMAL LOW (ref 3.87–5.11)
RDW: 15.2 % (ref 11.5–15.5)
WBC: 6.7 10*3/uL (ref 4.0–10.5)
nRBC: 0 % (ref 0.0–0.2)

## 2020-10-13 LAB — COMPREHENSIVE METABOLIC PANEL
ALT: 88 U/L — ABNORMAL HIGH (ref 0–44)
AST: 200 U/L — ABNORMAL HIGH (ref 15–41)
Albumin: 4.7 g/dL (ref 3.5–5.0)
Alkaline Phosphatase: 76 U/L (ref 38–126)
Anion gap: 29 — ABNORMAL HIGH (ref 5–15)
BUN: 17 mg/dL (ref 6–20)
CO2: 11 mmol/L — ABNORMAL LOW (ref 22–32)
Calcium: 9 mg/dL (ref 8.9–10.3)
Chloride: 95 mmol/L — ABNORMAL LOW (ref 98–111)
Creatinine, Ser: 0.87 mg/dL (ref 0.44–1.00)
GFR, Estimated: >60 mL/min (ref >60)
Glucose, Bld: 122 mg/dL — ABNORMAL HIGH (ref 70–99)
Potassium: 4.3 mmol/L (ref 3.5–5.1)
Sodium: 135 mmol/L (ref 135–145)
Total Bilirubin: 1.6 mg/dL — ABNORMAL HIGH (ref 0.3–1.2)
Total Protein: 8.3 g/dL — ABNORMAL HIGH (ref 6.5–8.1)

## 2020-10-13 LAB — LIPASE, BLOOD: Lipase: 44 U/L (ref 11–51)

## 2020-10-13 LAB — ETHANOL: Alcohol, Ethyl (B): <10 mg/dL (ref <10)

## 2020-10-13 MED ORDER — CHLORDIAZEPOXIDE HCL 25 MG PO CAPS: 25.0000 mg | ORAL_CAPSULE | Freq: Every day | ORAL | Status: DC

## 2020-10-13 MED ORDER — LORAZEPAM 2 MG/ML IJ SOLN
1.0000 mg | Freq: Once | INTRAMUSCULAR | Status: AC
  Administered 2020-10-13: 1 mg via INTRAVENOUS
  Filled 2020-10-13: qty 1

## 2020-10-13 MED ORDER — ONDANSETRON HCL 4 MG/2ML IJ SOLN: 4.0000 mg | Freq: Four times a day (QID) | INTRAMUSCULAR | Status: DC | PRN

## 2020-10-13 MED ORDER — ONDANSETRON HCL 4 MG/2ML IJ SOLN
4.0000 mg | Freq: Once | INTRAMUSCULAR | Status: AC | PRN
  Administered 2020-10-13: 4 mg via INTRAVENOUS
  Filled 2020-10-13: qty 2

## 2020-10-13 MED ORDER — PANTOPRAZOLE SODIUM 40 MG PO TBEC
40.0000 mg | DELAYED_RELEASE_TABLET | Freq: Two times a day (BID) | ORAL | Status: DC
  Administered 2020-10-13 – 2020-10-14 (×2): 40 mg via ORAL
  Filled 2020-10-13 (×2): qty 1

## 2020-10-13 MED ORDER — SODIUM CHLORIDE 0.9 % IV SOLN
1000.0000 mL | Freq: Once | INTRAVENOUS | Status: AC
  Administered 2020-10-13: 1000 mL via INTRAVENOUS

## 2020-10-13 MED ORDER — CHLORDIAZEPOXIDE HCL 25 MG PO CAPS: 25.0000 mg | ORAL_CAPSULE | Freq: Three times a day (TID) | ORAL | Status: DC

## 2020-10-13 MED ORDER — ONDANSETRON HCL 4 MG PO TABS: 4.0000 mg | ORAL_TABLET | Freq: Four times a day (QID) | ORAL | Status: DC | PRN

## 2020-10-13 MED ORDER — LACTATED RINGERS IV SOLN: INTRAVENOUS | Status: DC

## 2020-10-13 MED ORDER — CHLORDIAZEPOXIDE HCL 25 MG PO CAPS
25.0000 mg | ORAL_CAPSULE | Freq: Four times a day (QID) | ORAL | Status: DC
  Administered 2020-10-13 – 2020-10-14 (×2): 25 mg via ORAL
  Filled 2020-10-13 (×2): qty 1

## 2020-10-13 MED ORDER — ONDANSETRON HCL 4 MG/2ML IJ SOLN
INTRAMUSCULAR | Status: AC
  Administered 2020-10-13: 4 mg via INTRAVENOUS
  Filled 2020-10-13: qty 2

## 2020-10-13 MED ORDER — LORAZEPAM 1 MG PO TABS: 1.0000 mg | ORAL_TABLET | ORAL | Status: DC | PRN

## 2020-10-13 MED ORDER — ONDANSETRON HCL 4 MG/2ML IJ SOLN: 4.0000 mg | Freq: Once | INTRAMUSCULAR | Status: AC

## 2020-10-13 MED ORDER — LOPERAMIDE HCL 2 MG PO CAPS: 2.0000 mg | ORAL_CAPSULE | ORAL | Status: DC | PRN

## 2020-10-13 MED ORDER — CHLORDIAZEPOXIDE HCL 25 MG PO CAPS
25.0000 mg | ORAL_CAPSULE | ORAL | Status: DC
Start: 2020-10-16 — End: 2020-10-14

## 2020-10-13 MED ORDER — THIAMINE HCL 100 MG/ML IJ SOLN: 100.0000 mg | Freq: Every day | INTRAMUSCULAR | Status: DC

## 2020-10-13 MED ORDER — ADULT MULTIVITAMIN W/MINERALS CH
1.0000 | ORAL_TABLET | Freq: Every day | ORAL | Status: DC
  Administered 2020-10-14: 1 via ORAL
  Filled 2020-10-13: qty 1

## 2020-10-13 MED ORDER — LACTATED RINGERS IV BOLUS
1000.0000 mL | Freq: Once | INTRAVENOUS | Status: AC
  Administered 2020-10-13: 1000 mL via INTRAVENOUS

## 2020-10-13 MED ORDER — LORAZEPAM 2 MG/ML IJ SOLN
1.0000 mg | INTRAMUSCULAR | Status: DC | PRN
Start: 2020-10-13 — End: 2020-10-14
  Administered 2020-10-14 (×2): 2 mg via INTRAVENOUS
  Filled 2020-10-13 (×2): qty 1

## 2020-10-13 MED ORDER — FOLIC ACID 1 MG PO TABS
1.0000 mg | ORAL_TABLET | Freq: Every day | ORAL | Status: DC
  Administered 2020-10-14: 1 mg via ORAL
  Filled 2020-10-13: qty 1

## 2020-10-13 MED ORDER — SODIUM CHLORIDE 0.9% FLUSH
3.0000 mL | Freq: Two times a day (BID) | INTRAVENOUS | Status: DC
  Administered 2020-10-13 – 2020-10-14 (×2): 3 mL via INTRAVENOUS

## 2020-10-13 MED ORDER — THIAMINE HCL 100 MG PO TABS
100.0000 mg | ORAL_TABLET | Freq: Every day | ORAL | Status: DC
  Administered 2020-10-14: 100 mg via ORAL
  Filled 2020-10-13: qty 1

## 2020-10-13 NOTE — ED Triage Notes (Signed)
Patient arrived by EMS from home. Relapsed yesterday on ETOH. Has been having episodes of emesis all morning. EMS administered fluid and zofran. 128 blood sugar. 129/68, 97% RA, 138pulse

## 2020-10-13 NOTE — H&P (Signed)
History and Physical    Kathy [name] JYN:[id number] DOB: 1960/04/25 DOA: 10/13/2020  PCP: Crecencio Mc, MD  Patient coming from: Home via EMS  I have personally briefly reviewed patient's old medical records in [place]  Chief Complaint: Nausea and vomiting  HPI: Kathy [name] is a 60 y.o. female with medical history significant for alcohol use disorder, GERD, depression/anxiety, tobacco use who presented to the ED for evaluation of nausea and vomiting.  Patient reports a history of alcohol use disorder, sober for the last 5 years, however had a recent relapse.  He is not quantifying how much alcohol she consumes but says she drinks a few beers "here and there."  Last drink was yesterday (10/12/2020).  She woke up this morning with shakes and frequent episodes of nausea with vomiting.  Emesis has been clear without any obvious bleeding or coffee-ground appearance.  She has been able to tolerate small amounts of fluid intake but no food.  She has been experiencing indigestion and heartburn.  She does report a history of prior alcohol withdrawal and states that this feels similar.  ED Course:  Initial vitals showed BP 154/71, pulse 138, RR 20, temp 98.4 F, SPO2 100% on room air.  Labs show sodium 135, potassium 4.3, bicarb 11, BUN 17, creatinine 0.87, serum glucose 122, AST 200, ALT 88, alk phos 76, total bilirubin 1.6, anion gap 29, WBC 6.7, hemoglobin 13.0, platelets 76,000.  Lipase 44.  SARS-CoV-2 PCR in process.  Patient was given 1 L normal saline, IV Ativan 1 mg x 2, and IV Zofran x2 with persistent nausea/vomiting and tachycardia.  The hospitalist service was consulted to admit for further evaluation and management of alcohol withdrawal.  Review of Systems: All systems reviewed and are negative except as documented in history of present illness above.   Past Medical History:  Diagnosis Date   Abnormal weight loss    Alcohol abuse    Anxiety    Chicken pox     Colon polyps    COPD (chronic obstructive pulmonary disease) (HCC)    Depression    GERD (gastroesophageal reflux disease)    Hernia of abdominal cavity    Hip fracture (HCC) right   Neuropathy     Past Surgical History:  Procedure Laterality Date   NO PAST SURGERIES      Social History:  reports that she has been smoking cigarettes. She has a 20.00 pack-year smoking history. She has never used smokeless tobacco. She reports that she does not currently use alcohol after a past usage of about 42.0 standard drinks per week. She reports that she does not use drugs.  Allergies  Allergen Reactions   Aspirin Swelling    unknown   Lyrica [Pregabalin] Other (See Comments)    Blurred vision     Family History  Problem Relation Age of Onset   Hypertension Mother    Osteoporosis Mother    Meniere's disease Mother    Cancer Father 64       prostate   Colon cancer Paternal Grandmother    Alcohol abuse Other      Prior to Admission medications   Medication Sig Start Date End Date Taking? Authorizing Provider  acetaminophen (TYLENOL) 500 MG tablet Take 500 mg by mouth every 6 (six) hours as needed.    [provider]  amLODipine (NORVASC) 10 MG tablet Take by mouth.    [provider]  amLODipine (NORVASC) 5 MG tablet TAKE 1 TABLET  BY MOUTH TWICE DAILY 09/20/20   Crecencio Mc, MD  cyanocobalamin (,VITAMIN B-12,) 1000 MCG/ML injection Inject 1 mL (1,000 mcg total) into the muscle every 14 (fourteen) days. 06/21/19   Crecencio Mc, MD  cyclobenzaprine (FLEXERIL) 10 MG tablet Take 1 tablet (10 mg total) by mouth 3 (three) times daily as needed for muscle spasms. Involving the legs and feet 06/18/20   Crecencio Mc, MD  dicyclomine (BENTYL) 20 MG tablet Take 1 tablet (20 mg total) by mouth every 6 (six) hours as needed for spasms. 12/30/15   Crecencio Mc, MD  DULoxetine HCl 40 MG CPEP Take 40 mg by mouth daily. 09/29/19   Crecencio Mc, MD  gabapentin  (NEURONTIN) 400 MG capsule Take 2 capsules (800 mg total) by mouth 3 (three) times daily. 12/01/18   Bacigalupo, Dionne Bucy, MD  hydrOXYzine (VISTARIL) 50 MG capsule Take 1 capsule (50 mg total) by mouth 3 (three) times daily as needed. 06/05/20   Crecencio Mc, MD  omeprazole (PRILOSEC) 40 MG capsule Take 1 capsule (40 mg total) by mouth 2 (two) times daily. 05/18/19   Virginia Crews, MD  Syringe/Needle, Disp, (SYRINGE 3CC/25GX1") 25G X 1" 3 ML MISC Use for b12 injections 06/21/19   Crecencio Mc, MD  traMADol (ULTRAM) 50 MG tablet TAKE ONE TABLET BY MOUTH  2 times daily as NEEDED  For neuropathy 05/20/20   Crecencio Mc, MD  traZODone (DESYREL) 100 MG tablet 1/2 to 1 tablet 30 to 60 minutes before bedtime 05/20/20   Crecencio Mc, MD    Physical Exam: Vitals:   10/13/20 2100 10/13/20 2130 10/13/20 2230 10/13/20 2257  BP: 123/72 131/73 118/72 118/72  Pulse: (!) 123 (!) 125 (!) 121 100  Resp: 19 (!) 21 (!) 27   Temp:      SpO2: 97% 99% 96%   Weight:      Height:       Constitutional: Thin woman resting in bed, NAD, calm, comfortable Eyes: PERRL, lids and conjunctivae normal ENMT: Mucous membranes are dry. Posterior pharynx clear of any exudate or lesions.Normal dentition.  Neck: normal, supple, no masses. Respiratory: clear to auscultation bilaterally, no wheezing, no crackles. Normal respiratory effort. No accessory muscle use.  Cardiovascular: Tachycardic with regular rhythm, no murmurs / rubs / gallops. No extremity edema. 2+ pedal pulses. Abdomen: Mild epigastric tenderness, no masses palpated. No hepatosplenomegaly. Bowel sounds positive.  Musculoskeletal: no clubbing / cyanosis. No joint deformity upper and lower extremities. Good ROM, no contractures. Normal muscle tone.  Skin: no rashes, lesions, ulcers. No induration Neurologic: CN 2-12 grossly intact. Sensation intact. Strength 5/5 in all 4.  Psychiatric: Alert and oriented x 3. Normal mood.   Labs on Admission: I have  personally reviewed following labs and imaging studies  CBC: Recent Labs  Lab 10/13/20 1809  WBC 6.7  HGB 13.0  HCT 37.1  MCV 105.4*  PLT 76*   Basic Metabolic Panel: Recent Labs  Lab 10/13/20 1809  NA 135  K 4.3  CL 95*  CO2 11*  GLUCOSE 122*  BUN 17  CREATININE 0.87  CALCIUM 9.0   GFR: Estimated Creatinine Clearance: 54 mL/min (by C-G formula based on SCr of 0.87 mg/dL). Liver Function Tests: Recent Labs  Lab 10/13/20 1809  AST 200*  ALT 88*  ALKPHOS 76  BILITOT 1.6*  PROT 8.3*  ALBUMIN 4.7   Recent Labs  Lab 10/13/20 1809  LIPASE 44   No results for input(s):  AMMONIA in the last 168 hours. Coagulation Profile: No results for input(s): INR, PROTIME in the last 168 hours. Cardiac Enzymes: No results for input(s): CKTOTAL, CKMB, CKMBINDEX, TROPONINI in the last 168 hours. BNP (last 3 results) No results for input(s): PROBNP in the last 8760 hours. HbA1C: No results for input(s): HGBA1C in the last 72 hours. CBG: No results for input(s): GLUCAP in the last 168 hours. Lipid Profile: No results for input(s): CHOL, HDL, LDLCALC, TRIG, CHOLHDL, LDLDIRECT in the last 72 hours. Thyroid Function Tests: No results for input(s): TSH, T4TOTAL, FREET4, T3FREE, THYROIDAB in the last 72 hours. Anemia Panel: No results for input(s): VITAMINB12, FOLATE, FERRITIN, TIBC, IRON, RETICCTPCT in the last 72 hours. Urine analysis:    Component Value Date/Time   COLORURINE YELLOW (A) 09/07/2020 0913   APPEARANCEUR HAZY (A) 09/07/2020 0913   APPEARANCEUR Cloudy (A) 05/20/2020 1557   LABSPEC 1.013 09/07/2020 0913   PHURINE 5.0 09/07/2020 0913   GLUCOSEU 50 (A) 09/07/2020 0913   GLUCOSEU 100 (A) 05/20/2020 1557   HGBUR MODERATE (A) 09/07/2020 0913   BILIRUBINUR NEGATIVE 09/07/2020 0913   BILIRUBINUR Negative 05/20/2020 1557   KETONESUR 80 (A) 09/07/2020 0913   PROTEINUR 100 (A) 09/07/2020 0913   UROBILINOGEN 1.0 05/20/2020 1557   NITRITE NEGATIVE 09/07/2020 0913    LEUKOCYTESUR LARGE (A) 09/07/2020 0913    Radiological Exams on Admission: No results found.  EKG: Personally reviewed. Sinus tachycardia, rate 136, motion artifact present.  Rate is faster when compared to prior.  Assessment/Plan Principal Problem:   Alcohol abuse with withdrawal ([place]) Active Problems:   MDD (major depressive disorder)   Elevated LFTs   Thrombocytopenia ([place])   Saloni Maeven Mcdougall is a 60 y.o. female with medical history significant for alcohol use disorder, GERD, depression/anxiety, tobacco use who is admitted with alcohol withdrawal.  Alcohol use disorder with acute withdrawal: Patient reports recent relapse after several years of sobriety.  Now presenting with early alcohol withdrawal. -Start CIWA protocol with Ativan as needed -Start Librium taper -Give 1 L LR bolus followed by maintenance fluids overnight -Continue antiemetics as needed, advance diet as tolerated  Thrombocytopenia: Mild without obvious bleeding.  Likely secondary to chronic alcohol use.  Continue to monitor.  Elevated LFTs: Likely due to alcohol use.  Continue to monitor.  Tobacco use: Reports smoking 3/4 pack per day.  Declines nicotine patch.  DVT prophylaxis: SCDs Code Status: Full code, confirmed with patient Family Communication: Discussed with patient, she has discussed with family Disposition Plan: From home and likely return to home pending clinical progress Consults called: None Level of care: Med-Surg Admission status:  Status is: Observation  The patient remains OBS appropriate and will d/c before 2 midnights.  Dispo: The patient is from: Home              Anticipated d/c is to: Home              Patient currently is not medically stable to d/c.  Zada Finders MD [facility]  If 7PM-7AM, please contact night-coverage [email or web address]  10/13/2020, 11:09 PM

## 2020-10-13 NOTE — ED Provider Notes (Signed)
[facility] Emergency Department Provider Note   ____________________________________________    I have reviewed the triage vital signs and the nursing notes.   HISTORY  Chief Complaint      HPI Kathy [name] is a 60 y.o. female with history of alcohol abuse, COPD who presents with complaints of alcohol withdrawal.  Patient reports that she is stopping alcohol use but is having severe nausea vomiting and some lightheadedness.  She complains of abdominal cramping as well.  She reportedly last used alcohol yesterday.  Denies drug use.  Past Medical History:  Diagnosis Date   Abnormal weight loss    Alcohol abuse    Anxiety    Chicken pox    Colon polyps    COPD (chronic obstructive pulmonary disease) (HCC)    Depression    GERD (gastroesophageal reflux disease)    Hernia of abdominal cavity    Hip fracture ([place]) right   Neuropathy     Patient Active Problem List   Diagnosis Date Noted   Alcohol abuse with withdrawal ([place]) 99991111   Alcoholic hepatitis without ascites 09/09/2020   Cholelithiasis 09/09/2020   Paraesophageal hiatal hernia 09/09/2020   Gallbladder mass 09/09/2020   Constipation 05/21/2020   Dysuria 05/21/2020   Mild malnutrition ([place]) 05/20/2020   Mild episode of recurrent major depressive disorder ([place]) 05/20/2020   Essential hypertension 10/03/2019   Insomnia due to anxiety and fear 09/29/2019   GAD (generalized anxiety disorder) 09/19/2018   Poor dentition 05/18/2018   Tobacco abuse 02/22/2018   Atrophy of left kidney 02/22/2018   Diverticulosis of colon without diverticulitis 02/22/2018   MDD (major depressive disorder) 09/13/2017   Osteoporosis 10/17/2016   Prediabetes 10/17/2016   Pernicious anemia 01/01/2016   Hyponatremia 09/10/2015   [place] 0000000   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 Authorizing Provider  acetaminophen (TYLENOL) 500 MG tablet Take 500 mg by mouth every 6 (six) hours as needed.    [provider]  amLODipine (NORVASC) 10 MG tablet Take by mouth.    [provider]  amLODipine (NORVASC) 5 MG tablet TAKE 1 TABLET BY MOUTH TWICE DAILY 09/20/20   Crecencio Mc, MD  cyanocobalamin (,VITAMIN B-12,) 1000 MCG/ML injection Inject 1 mL (1,000 mcg total) into the muscle every 14 (fourteen) days. 06/21/19   Crecencio Mc, MD  cyclobenzaprine (FLEXERIL) 10 MG tablet Take 1 tablet (10 mg total) by mouth 3 (three) times daily as needed for muscle spasms. Involving the legs and feet 06/18/20   Crecencio Mc, MD  dicyclomine (BENTYL) 20 MG tablet Take 1 tablet (20 mg total) by mouth every 6 (six) hours as needed for spasms. 12/30/15   Crecencio Mc, MD  DULoxetine HCl 40 MG CPEP Take 40 mg by mouth daily. 09/29/19   Crecencio Mc, MD  gabapentin (NEURONTIN) 400 MG capsule Take 2 capsules (800 mg total) by mouth 3 (three) times daily. 12/01/18   Bacigalupo, Dionne Bucy, MD  hydrOXYzine (VISTARIL) 50 MG capsule Take 1 capsule (50 mg total) by mouth 3 (three) times daily as needed. 06/05/20   Derrel Nip,  Aris Everts, MD  omeprazole (PRILOSEC) 40 MG capsule Take 1 capsule (40 mg total) by mouth 2 (two) times daily. 05/18/19   Virginia Crews, MD  Syringe/Needle, Disp, (SYRINGE 3CC/25GX1") 25G X 1" 3 ML MISC Use for b12 injections 06/21/19   Crecencio Mc, MD  traMADol (ULTRAM) 50 MG tablet TAKE ONE TABLET BY MOUTH  2 times daily as NEEDED  For neuropathy 05/20/20   Crecencio Mc, MD  traZODone (DESYREL) 100 MG tablet 1/2 to 1 tablet 30 to 60 minutes before bedtime 05/20/20   Crecencio Mc, MD      Allergies Aspirin and Lyrica [pregabalin]  Family History  Problem Relation Age of Onset   Hypertension Mother    Osteoporosis Mother    Meniere's disease Mother    Cancer Father 79       prostate   Colon cancer Paternal Grandmother    Alcohol abuse Other     Social History Social History   Tobacco Use   Smoking status: Every Day    Packs/day: 0.50    Years: 40.00    Pack years: 20.00    Types: Cigarettes   Smokeless tobacco: Never   Tobacco comments:    smokes one pack/week currently  Vaping Use   Vaping Use: Never used  Substance Use Topics   Alcohol use: Not Currently    Alcohol/week: 42.0 standard drinks    Types: 42 Glasses of wine per week    Comment: since late April    Drug use: No    Review of Systems  Constitutional: No fever/chills Eyes: No visual changes.  ENT: No sore throat. Cardiovascular: Denies chest pain. Respiratory: Denies shortness of breath. Gastrointestinal: As above, some dizziness Genitourinary: Negative for dysuria. Musculoskeletal: Negative for back pain. Skin: Negative for rash. Neurological: Negative for headaches    ____________________________________________   PHYSICAL EXAM:  VITAL SIGNS: ED Triage Vitals  Enc Vitals Group     BP 10/13/20 1811 (!) 154/71     Pulse Rate 10/13/20 1811 (!) 138     Resp 10/13/20 1811 20     Temp 10/13/20 1811 98.4 F (36.9 C)     Temp src --      SpO2 10/13/20 1811 100 %     Weight 10/13/20 1807 48.5 kg (107 lb)     Height 10/13/20 1807 1.575 m ('5\' 2"'$ )     Head Circumference --      Peak Flow --      Pain Score 10/13/20 1807 5     Pain Loc --      Pain Edu? --      Excl. in [place]? --     Constitutional: Alert and oriented.  Eyes: Conjunctivae are normal.   Nose: No congestion/rhinnorhea. Mouth/Throat: Mucous membranes are moist.    Cardiovascular: Tachycardia regular rhythm. Grossly normal heart sounds.  Good peripheral circulation. Respiratory: Normal respiratory effort.   No retractions.  Gastrointestinal: Soft and nontender. No distention.    Musculoskeletal: No lower extremity tenderness nor edema.  Warm and well perfused Neurologic:  Normal speech and language. No gross focal neurologic deficits are appreciated.  Skin:  Skin is warm, dry and intact. No rash noted. Psychiatric: Mood and affect are normal. Speech and behavior are normal.  ____________________________________________   LABS (all labs ordered are listed, but only abnormal results are displayed)  Labs Reviewed  COMPREHENSIVE METABOLIC PANEL - Abnormal; Notable for the following components:      Result Value  Chloride 95 (*)    CO2 11 (*)    Glucose, Bld 122 (*)    Total Protein 8.3 (*)    AST 200 (*)    ALT 88 (*)    Total Bilirubin 1.6 (*)    Anion gap 29 (*)    All other components within normal limits  CBC - Abnormal; Notable for the following components:   RBC 3.52 (*)    MCV 105.4 (*)    MCH 36.9 (*)    Platelets 76 (*)    All other components within normal limits  SARS CORONAVIRUS 2 (TAT 6-24 HRS)  LIPASE, BLOOD  URINALYSIS, COMPLETE (UACMP) WITH MICROSCOPIC  ETHANOL  POC URINE PREG, ED   ____________________________________________  EKG  ED ECG REPORT I, Lavonia Drafts, the attending physician, personally viewed and interpreted this ECG.  Date: 10/13/2020  Rhythm: Sinus tachycardia QRS Axis: normal Intervals: normal ST/T Wave abnormalities: normal Narrative Interpretation: no evidence of acute ischemia  ____________________________________________  RADIOLOGY  None ____________________________________________   PROCEDURES  Procedure(s) performed: No  Procedures   Critical Care performed: No ____________________________________________   INITIAL IMPRESSION / ASSESSMENT AND PLAN / ED COURSE  Pertinent labs & imaging results that were available during my care of the patient were reviewed by me and considered in my medical decision making (see  chart for details).   Patient presents with symptoms consistent with alcohol withdrawal.  She is significantly tachycardic.  Denies a history of alcohol withdrawal seizures.  Complains of symptoms as above.  Will treat with IV fluids, IV Ativan  Lab work notable for elevated anion gap, bicarb of 11, suspicious for alcoholic ketoacidosis  Despite fluids and IV Ativan patient remains markedly tachycardic, will contact the hospitalist for admission    ____________________________________________   FINAL CLINICAL IMPRESSION(S) / ED DIAGNOSES  Final diagnoses:  Alcohol withdrawal syndrome without complication ([place])        Note:  This document was prepared using Dragon voice recognition software and may include unintentional dictation errors.    Lavonia Drafts, MD 10/13/20 2252

## 2020-10-13 NOTE — ED Triage Notes (Signed)
Pt states that she started having vomiting this AM after relapsing on her drinking yesterday- pt states that she has been vomiting on and off all day- pt states she feels very shaky

## 2020-10-14 DIAGNOSIS — F10139 Alcohol abuse with withdrawal, unspecified: Secondary | ICD-10-CM | POA: Diagnosis not present

## 2020-10-14 DIAGNOSIS — F10130 Alcohol abuse with withdrawal, uncomplicated: Secondary | ICD-10-CM | POA: Diagnosis not present

## 2020-10-14 LAB — COMPREHENSIVE METABOLIC PANEL
ALT: 57 U/L — ABNORMAL HIGH (ref 0–44)
AST: 93 U/L — ABNORMAL HIGH (ref 15–41)
Albumin: 3.9 g/dL (ref 3.5–5.0)
Alkaline Phosphatase: 58 U/L (ref 38–126)
Anion gap: 14 (ref 5–15)
BUN: 9 mg/dL (ref 6–20)
CO2: 24 mmol/L (ref 22–32)
Calcium: 8.5 mg/dL — ABNORMAL LOW (ref 8.9–10.3)
Chloride: 94 mmol/L — ABNORMAL LOW (ref 98–111)
Creatinine, Ser: 0.44 mg/dL (ref 0.44–1.00)
GFR, Estimated: >60 mL/min (ref >60)
Glucose, Bld: 93 mg/dL (ref 70–99)
Potassium: 3.4 mmol/L — ABNORMAL LOW (ref 3.5–5.1)
Sodium: 132 mmol/L — ABNORMAL LOW (ref 135–145)
Total Bilirubin: 1.9 mg/dL — ABNORMAL HIGH (ref 0.3–1.2)
Total Protein: 6.8 g/dL (ref 6.5–8.1)

## 2020-10-14 LAB — CBC
HCT: 35.5 % — ABNORMAL LOW (ref 36.0–46.0)
Hemoglobin: 12.7 g/dL (ref 12.0–15.0)
MCH: 35.9 pg — ABNORMAL HIGH (ref 26.0–34.0)
MCHC: 35.8 g/dL (ref 30.0–36.0)
MCV: 100.3 fL — ABNORMAL HIGH (ref 80.0–100.0)
Platelets: 135 10*3/uL — ABNORMAL LOW (ref 150–400)
RBC: 3.54 MIL/uL — ABNORMAL LOW (ref 3.87–5.11)
RDW: 14.7 % (ref 11.5–15.5)
WBC: 5 10*3/uL (ref 4.0–10.5)
nRBC: 0 % (ref 0.0–0.2)

## 2020-10-14 LAB — PHOSPHORUS: Phosphorus: 1.6 mg/dL — ABNORMAL LOW (ref 2.5–4.6)

## 2020-10-14 LAB — SARS CORONAVIRUS 2 (TAT 6-24 HRS): SARS Coronavirus 2: NEGATIVE

## 2020-10-14 LAB — HIV ANTIBODY (ROUTINE TESTING W REFLEX): HIV Screen 4th Generation wRfx: NONREACTIVE

## 2020-10-14 LAB — MAGNESIUM: Magnesium: 1.2 mg/dL — ABNORMAL LOW (ref 1.7–2.4)

## 2020-10-14 MED ORDER — POTASSIUM PHOSPHATES 15 MMOLE/5ML IV SOLN
30.0000 mmol | Freq: Once | INTRAVENOUS | Status: DC
  Filled 2020-10-14: qty 10

## 2020-10-14 MED ORDER — NICOTINE 21 MG/24HR TD PT24: 21.0000 mg | MEDICATED_PATCH | Freq: Every day | TRANSDERMAL | Status: DC | PRN

## 2020-10-14 MED ORDER — DM-GUAIFENESIN ER 30-600 MG PO TB12: 1.0000 | ORAL_TABLET | Freq: Two times a day (BID) | ORAL | Status: DC | PRN

## 2020-10-14 MED ORDER — POTASSIUM CHLORIDE 10 MEQ/100ML IV SOLN
10.0000 meq | INTRAVENOUS | Status: DC
  Administered 2020-10-14: 10 meq via INTRAVENOUS
  Filled 2020-10-14: qty 100

## 2020-10-14 MED ORDER — HYDRALAZINE HCL 20 MG/ML IJ SOLN: 10.0000 mg | INTRAMUSCULAR | Status: DC | PRN

## 2020-10-14 MED ORDER — IPRATROPIUM-ALBUTEROL 0.5-2.5 (3) MG/3ML IN SOLN
3.0000 mL | RESPIRATORY_TRACT | Status: DC | PRN
Start: 2020-10-14 — End: 2020-10-14

## 2020-10-14 MED ORDER — MAGNESIUM SULFATE 4 GM/100ML IV SOLN
4.0000 g | Freq: Once | INTRAVENOUS | Status: AC
  Administered 2020-10-14: 4 g via INTRAVENOUS
  Filled 2020-10-14: qty 100

## 2020-10-14 MED ORDER — SENNOSIDES-DOCUSATE SODIUM 8.6-50 MG PO TABS: 1.0000 | ORAL_TABLET | Freq: Every evening | ORAL | Status: DC | PRN

## 2020-10-14 MED ORDER — METOPROLOL TARTRATE 5 MG/5ML IV SOLN: 5.0000 mg | INTRAVENOUS | Status: DC | PRN

## 2020-10-14 MED ORDER — ACETAMINOPHEN 325 MG PO TABS
650.0000 mg | ORAL_TABLET | Freq: Four times a day (QID) | ORAL | Status: DC | PRN
Start: 2020-10-14 — End: 2020-10-14

## 2020-10-14 MED ORDER — POTASSIUM CHLORIDE CRYS ER 20 MEQ PO TBCR
20.0000 meq | EXTENDED_RELEASE_TABLET | Freq: Once | ORAL | Status: AC
  Administered 2020-10-14: 20 meq via ORAL
  Filled 2020-10-14: qty 1

## 2020-10-14 NOTE — ED Notes (Addendum)
This RN and Minette Brine, RN at bedside to administer medication. Pt has urinated on the end of the bed. When asked why she did this pt states, "I pressed to call bell and nobody came and I am connected to all this." Pt cleaned and dried, full linen Pt did not press the call bell. Pt requesting to step outside and call someone and smoke a cigarette, explained that this is not allowed. Offered pt a nicotine pt but pt refused. Pt states that she was told last night that she did not need to stay and does not intend on staying in the hopsital. Pt states, "they told me I was only going to spend the night." States that this is not the case and that she needed to stay because her electrolytes were abnormal. Pt states, I am not going to stay. Pt said she is going to stay for "little bit of fluid" but while pt was on the phone she told her family member that she would be coming home today.   Admitting doctor notified.

## 2020-10-14 NOTE — Discharge Summary (Signed)
Physician Discharge Summary  Kathy [name] [id number] DOB: [birth year] DOA: 10/13/2020  PCP: Crecencio Mc, MD  Admit date: 10/13/2020 Discharge date: 10/14/2020  Admitted From: Home Disposition: Left AGAINST MEDICAL ADVICE  Brief/Interim Summary: 60 year old with history of alcohol abuse, GERD, anxiety, depression, tobacco use admitted for nausea vomiting and admitted for alcohol withdrawal.Seen during hospitalization she was started on CIWA protocol, Librium taper, multivitamin, folic acid and thiamine.  She was also placed on PPI due to concern for possible gastritis.  Due to severe multiple electrolyte malady, repletion was ordered. When I saw the patient she was adamant about going home I explained her that currently it is not safe for her to go home given acute alcohol withdrawal symptoms and multiple electrolyte malady.  Patient stated she understands the risk of going home including death.  She left AGAINST MEDICAL ADVICE.  Body mass index is 19.57 kg/m.         Discharge Diagnoses:  Principal Problem:   Alcohol abuse with withdrawal ([place]) Active Problems:   MDD (major depressive disorder)   Elevated LFTs   Thrombocytopenia (HCC)    Subjective: Seen and examined at bedside, no complaints at this time but wants to leave the hospital despite of understanding that she needs an hospital medical treatment for alcohol withdrawal and multiple electrolyte abnormality.  Discharge Exam: Vitals:   10/14/20 0800 10/14/20 0858  BP: 113/69 117/72  Pulse: (!) 113 (!) 104  Resp: 18   Temp:    SpO2: 97%    Vitals:   10/14/20 0430 10/14/20 0730 10/14/20 0800 10/14/20 0858  BP: 126/76 112/72 113/69 117/72  Pulse: (!) 124 (!) 113 (!) 113 (!) 104  Resp: (!) '22 19 18   '$ Temp:      SpO2: 100% 98% 97%   Weight:      Height:        General: Pt is alert, awake, not in acute distress Cardiovascular: Sinus tachycardia.  S1/S2 +, no rubs, no gallops Respiratory: CTA  bilaterally, no wheezing, no rhonchi Abdominal: Soft, NT, ND, bowel sounds + Extremities: no edema, no cyanosis  Discharge Instructions    Allergies  Allergen Reactions   Aspirin Swelling    unknown   Lyrica [Pregabalin] Other (See Comments)    Blurred vision     You were cared for by a hospitalist during your hospital stay. If you have any questions about your discharge medications or the care you received while you were in the hospital after you are discharged, you can call the unit and asked to speak with the hospitalist on call if the hospitalist that took care of you is not available. Once you are discharged, your primary care physician will handle any further medical issues. Please note that no refills for any discharge medications will be authorized once you are discharged, as it is imperative that you return to your primary care physician (or establish a relationship with a primary care physician if you do not have one) for your aftercare needs so that they can reassess your need for medications and monitor your lab values.   Procedures/Studies: DG Chest 2 View  Result Date: 09/28/2020 CLINICAL DATA:  CP/SOB EXAM: CHEST - 2 VIEW COMPARISON:  August 15, 2020. FINDINGS: The heart size and mediastinal contours are within normal limits. Atherosclerosis of the aorta. Both lungs are clear. No visible pleural effusions or pneumothorax. No acute osseous abnormality. Small to moderate hiatal hernia. IMPRESSION: 1. No active cardiopulmonary disease. 2. Small to moderate hiatal  hernia. Electronically Signed   By: Margaretha Sheffield MD   On: 09/28/2020 16:36     The results of significant diagnostics from this hospitalization (including imaging, microbiology, ancillary and laboratory) are listed below for reference.     Microbiology: Recent Results (from the past 240 hour(s))  SARS CORONAVIRUS 2 (TAT 6-24 HRS) Nasopharyngeal Nasopharyngeal Swab     Status: None   Collection Time: 10/13/20  10:48 PM   Specimen: Nasopharyngeal Swab  Result Value Ref Range Status   SARS Coronavirus 2 NEGATIVE NEGATIVE Final    Comment: (NOTE) SARS-CoV-2 target nucleic acids are NOT DETECTED.  The SARS-CoV-2 RNA is generally detectable in upper and lower respiratory specimens during the acute phase of infection. Negative results do not preclude SARS-CoV-2 infection, do not rule out co-infections with other pathogens, and should not be used as the sole basis for treatment or other patient management decisions. Negative results must be combined with clinical observations, patient history, and epidemiological information. The expected result is Negative.  Fact Sheet for Patients: SugarRoll.be  Fact Sheet for Healthcare Providers: https://www.woods-mathews.com/  This test is not yet approved or cleared by the [place] FDA and  has been authorized for detection and/or diagnosis of SARS-CoV-2 by FDA under an Emergency Use Authorization (EUA). This EUA will remain  in effect (meaning this test can be used) for the duration of the COVID-19 declaration under Se ction 564(b)(1) of the Act, 21 U.S.C. section 360bbb-3(b)(1), unless the authorization is terminated or revoked sooner.  Performed at [facility], [place] [address] [place], [place] [address]      Labs: BNP (last 3 results) No results for input(s): BNP in the last 8760 hours. Basic Metabolic Panel: Recent Labs  Lab 10/13/20 1809 10/14/20 0719  NA 135 132*  K 4.3 3.4*  CL 95* 94*  CO2 11* 24  GLUCOSE 122* 93  BUN 17 9  CREATININE 0.87 0.44  CALCIUM 9.0 8.5*  MG  --  1.2*  PHOS  --  1.6*   Liver Function Tests: Recent Labs  Lab 10/13/20 1809 10/14/20 0719  AST 200* 93*  ALT 88* 57*  ALKPHOS 76 58  BILITOT 1.6* 1.9*  PROT 8.3* 6.8  ALBUMIN 4.7 3.9   Recent Labs  Lab 10/13/20 1809  LIPASE 44   No results for input(s): AMMONIA in the last 168  hours. CBC: Recent Labs  Lab 10/13/20 1809 10/14/20 0719  WBC 6.7 5.0  HGB 13.0 12.7  HCT 37.1 35.5*  MCV 105.4* 100.3*  PLT 76* 135*   Cardiac Enzymes: No results for input(s): CKTOTAL, CKMB, CKMBINDEX, TROPONINI in the last 168 hours. BNP: Invalid input(s): POCBNP CBG: No results for input(s): GLUCAP in the last 168 hours. D-Dimer No results for input(s): DDIMER in the last 72 hours. Hgb A1c No results for input(s): HGBA1C in the last 72 hours. Lipid Profile No results for input(s): CHOL, HDL, LDLCALC, TRIG, CHOLHDL, LDLDIRECT in the last 72 hours. Thyroid function studies No results for input(s): TSH, T4TOTAL, T3FREE, THYROIDAB in the last 72 hours.  Invalid input(s): FREET3 Anemia work up No results for input(s): VITAMINB12, FOLATE, FERRITIN, TIBC, IRON, RETICCTPCT in the last 72 hours. Urinalysis    Component Value Date/Time   COLORURINE YELLOW (A) 09/07/2020 0913   APPEARANCEUR HAZY (A) 09/07/2020 0913   APPEARANCEUR Cloudy (A) 05/20/2020 1557   LABSPEC 1.013 09/07/2020 0913   PHURINE 5.0 09/07/2020 0913   GLUCOSEU 50 (A) 09/07/2020 0913   GLUCOSEU 100 (A) 05/20/2020  [place] (A) 09/07/2020 0913   BILIRUBINUR NEGATIVE 09/07/2020 0913   BILIRUBINUR Negative 05/20/2020 1557   KETONESUR 80 (A) 09/07/2020 0913   PROTEINUR 100 (A) 09/07/2020 0913   UROBILINOGEN 1.0 05/20/2020 1557   NITRITE NEGATIVE 09/07/2020 0913   LEUKOCYTESUR LARGE (A) 09/07/2020 0913   Sepsis Labs Invalid input(s): PROCALCITONIN,  WBC,  LACTICIDVEN Microbiology Recent Results (from the past 240 hour(s))  SARS CORONAVIRUS 2 (TAT 6-24 HRS) Nasopharyngeal Nasopharyngeal Swab     Status: None   Collection Time: 10/13/20 10:48 PM   Specimen: Nasopharyngeal Swab  Result Value Ref Range Status   SARS Coronavirus 2 NEGATIVE NEGATIVE Final    Comment: (NOTE) SARS-CoV-2 target nucleic acids are NOT DETECTED.  The SARS-CoV-2 RNA is generally detectable in upper and  lower respiratory specimens during the acute phase of infection. Negative results do not preclude SARS-CoV-2 infection, do not rule out co-infections with other pathogens, and should not be used as the sole basis for treatment or other patient management decisions. Negative results must be combined with clinical observations, patient history, and epidemiological information. The expected result is Negative.  Fact Sheet for Patients: SugarRoll.be  Fact Sheet for Healthcare Providers: https://www.woods-mathews.com/  This test is not yet approved or cleared by the [place] FDA and  has been authorized for detection and/or diagnosis of SARS-CoV-2 by FDA under an Emergency Use Authorization (EUA). This EUA will remain  in effect (meaning this test can be used) for the duration of the COVID-19 declaration under Se ction 564(b)(1) of the Act, 21 U.S.C. section 360bbb-3(b)(1), unless the authorization is terminated or revoked sooner.  Performed at [facility], [place] [address], [place], [place] [address]      Time coordinating discharge:  I have spent 35 minutes face to face with the patient and on the ward discussing the patients care, assessment, plan and disposition with other care givers. >50% of the time was devoted counseling the patient about the risks and benefits of treatment/Discharge disposition and coordinating care.   SIGNED:   Damita Lack, MD  [facility] 10/14/2020, 1:27 PM   If 7PM-7AM, please contact night-coverage

## 2020-10-14 NOTE — ED Notes (Addendum)
This RN and Scientist, product/process development at bedside, pt has urinated in the bed again. Pt states that she is ready to go and that she has too much going outside of here and she cannot stay. Explained to the pt that she would be leaving AMA and there are risk including death if she left without full treatment. Pt verbalized understanding.

## 2020-10-14 NOTE — ED Notes (Signed)
Lab at bedside at this time.  

## 2020-10-23 ENCOUNTER — Other Ambulatory Visit: Payer: Self-pay | Admitting: Internal Medicine

## 2020-10-23 DIAGNOSIS — H6982 Other specified disorders of Eustachian tube, left ear: Secondary | ICD-10-CM | POA: Diagnosis not present

## 2020-10-23 DIAGNOSIS — H9313 Tinnitus, bilateral: Secondary | ICD-10-CM | POA: Diagnosis not present

## 2020-10-23 DIAGNOSIS — J301 Allergic rhinitis due to pollen: Secondary | ICD-10-CM | POA: Diagnosis not present

## 2020-10-23 DIAGNOSIS — H903 Sensorineural hearing loss, bilateral: Secondary | ICD-10-CM | POA: Diagnosis not present

## 2020-10-23 DIAGNOSIS — R42 Dizziness and giddiness: Secondary | ICD-10-CM | POA: Diagnosis not present

## 2020-12-05 ENCOUNTER — Other Ambulatory Visit: Payer: Self-pay | Admitting: Internal Medicine

## 2020-12-05 NOTE — Telephone Encounter (Signed)
Patient calling in for refill, last seen 06/2020 and last sent in 05/20/20.

## 2020-12-05 NOTE — Telephone Encounter (Signed)
Refilled: 05/20/2020 Last OV: 10/25/2020 Next OV: 12/18/2020

## 2020-12-10 ENCOUNTER — Other Ambulatory Visit: Payer: Self-pay | Admitting: Family Medicine

## 2020-12-10 NOTE — Telephone Encounter (Signed)
Requested medications are due for refill today.  yes  Requested medications are on the active medications list.  yes  Last refill. Prilosec 05/18/2019, Gabapentin 12/01/2018  Future visit scheduled.   no  Notes to clinic.  Pt no longer seeing Dr. Brita Romp.

## 2020-12-11 ENCOUNTER — Telehealth: Payer: Self-pay | Admitting: Internal Medicine

## 2020-12-11 ENCOUNTER — Ambulatory Visit (INDEPENDENT_AMBULATORY_CARE_PROVIDER_SITE_OTHER): Payer: PPO | Admitting: *Deleted

## 2020-12-11 ENCOUNTER — Other Ambulatory Visit: Payer: Self-pay

## 2020-12-11 DIAGNOSIS — E538 Deficiency of other specified B group vitamins: Secondary | ICD-10-CM

## 2020-12-11 MED ORDER — GABAPENTIN 400 MG PO CAPS
800.0000 mg | ORAL_CAPSULE | Freq: Three times a day (TID) | ORAL | 5 refills | Status: DC
Start: 2020-12-11 — End: 2021-04-01

## 2020-12-11 MED ORDER — OMEPRAZOLE 40 MG PO CPDR: 40.0000 mg | DELAYED_RELEASE_CAPSULE | Freq: Two times a day (BID) | ORAL | 1 refills | Status: DC

## 2020-12-11 MED ORDER — CYANOCOBALAMIN 1000 MCG/ML IJ SOLN
1000.0000 ug | Freq: Once | INTRAMUSCULAR | Status: AC
  Administered 2020-12-11: 1000 ug via INTRAMUSCULAR

## 2020-12-11 NOTE — Progress Notes (Signed)
Patient presented for B 12 injection to right deltoid, patient voiced no concerns nor showed any signs of distress during injection. 

## 2020-12-11 NOTE — Telephone Encounter (Signed)
[facility]  gabapentin (NEURONTIN) 400 MG capsule omeprazole (PRILOSEC) 40 MG capsule

## 2020-12-12 ENCOUNTER — Encounter: Payer: Self-pay | Admitting: Anesthesiology

## 2020-12-12 NOTE — Anesthesia Preprocedure Evaluation (Deleted)
Anesthesia Evaluation    Airway        Dental   Pulmonary Current Smoker,           Cardiovascular      Neuro/Psych    GI/Hepatic   Endo/Other    Renal/GU      Musculoskeletal   Abdominal   Peds  Hematology   Anesthesia Other Findings   Reproductive/Obstetrics                             Anesthesia Physical Anesthesia Plan  ASA:   Anesthesia Plan: General   Post-op Pain Management:    Induction: Intravenous  PONV Risk Score and Plan:   Airway Management Planned: Natural Airway and Nasal Cannula  Additional Equipment:   Intra-op Plan:   Post-operative Plan:   Informed Consent: I have reviewed the patients History and Physical, chart, labs and discussed the procedure including the risks, benefits and alternatives for the proposed anesthesia with the patient or authorized representative who has indicated his/her understanding and acceptance.     Dental Advisory Given  Plan Discussed with: Anesthesiologist, CRNA and Surgeon  Anesthesia Plan Comments: (Patient consented for risks of anesthesia including but not limited to:  - adverse reactions to medications - risk of airway placement if required - damage to eyes, teeth, lips or other oral mucosa - nerve damage due to positioning  - sore throat or hoarseness - Damage to heart, brain, nerves, lungs, other parts of body or loss of life  Patient voiced understanding.)        Anesthesia Quick Evaluation

## 2020-12-13 ENCOUNTER — Encounter: Admission: RE | Payer: Self-pay | Source: Home / Self Care

## 2020-12-13 ENCOUNTER — Ambulatory Visit: Admission: RE | Admit: 2020-12-13 | Payer: PPO | Source: Home / Self Care

## 2020-12-13 HISTORY — DX: Abnormal weight loss: R63.4

## 2020-12-13 HISTORY — DX: Gastro-esophageal reflux disease without esophagitis: K21.9

## 2020-12-13 HISTORY — DX: Anxiety disorder, unspecified: F41.9

## 2020-12-13 HISTORY — DX: Chronic obstructive pulmonary disease, unspecified: J44.9

## 2020-12-13 SURGERY — COLONOSCOPY WITH PROPOFOL
Anesthesia: General

## 2020-12-18 ENCOUNTER — Encounter: Payer: Self-pay | Admitting: Internal Medicine

## 2020-12-18 ENCOUNTER — Ambulatory Visit (INDEPENDENT_AMBULATORY_CARE_PROVIDER_SITE_OTHER): Payer: PPO | Admitting: Internal Medicine

## 2020-12-18 DIAGNOSIS — I700 Atherosclerosis of aorta: Secondary | ICD-10-CM | POA: Diagnosis not present

## 2020-12-18 DIAGNOSIS — E8729 Other acidosis: Secondary | ICD-10-CM | POA: Diagnosis not present

## 2020-12-18 DIAGNOSIS — D696 Thrombocytopenia, unspecified: Secondary | ICD-10-CM | POA: Diagnosis not present

## 2020-12-18 DIAGNOSIS — F1024 Alcohol dependence with alcohol-induced mood disorder: Secondary | ICD-10-CM

## 2020-12-18 DIAGNOSIS — K7010 Alcoholic hepatitis without ascites: Secondary | ICD-10-CM

## 2020-12-18 MED ORDER — ESCITALOPRAM OXALATE 10 MG PO TABS: 10.0000 mg | ORAL_TABLET | Freq: Every day | ORAL | 0 refills | Status: DC

## 2020-12-18 MED ORDER — TRAZODONE HCL 50 MG PO TABS: ORAL_TABLET | ORAL | 1 refills | Status: DC

## 2020-12-18 NOTE — Progress Notes (Signed)
Virtual Visit converted to Telephone  Note  This visit type was conducted due to national recommendations for restrictions regarding the COVID-19 pandemic (e.g. social distancing).  This format is felt to be most appropriate for this patient at this time.  All issues noted in this document were discussed and addressed.  No physical exam was performed (except for noted visual exam findings with Video Visits).   I connected withNAME@ on 12/18/20 at  4:30 PM EDT by a video enabled telemedicine application  and verified that I am speaking with the correct person using two identifiers. Location patient: home Location provider: work or home office Persons participating in the virtual visit: patient, provider  I discussed the limitations, risks, security and privacy concerns of performing an evaluation and management service by telephone and the availability of in person appointments. I also discussed with the patient that there may be a patient responsible charge related to this service. The patient expressed understanding and agreed to proceed.  Interactive audio and video telecommunications were attempted between this provider and patient, however failed, due to patient having technical difficulties.   We continued and completed visit with audio only.   Reason for visit: follow up on alcoholism with polyneuropathy   HPI:  60 yr old female with GAD,  alcoholism  with chronic pain due to  polyneuropathy, with recent relapse requiring use of librium taper in June , presents for follow up.   Patient was treated in ER in July for intractable nausea /vomiting.  US gallbladder and CT abd /pelvis done ; cholelithiasis without acute cholecystitis  noted,  GB wall adherent mass , non obstructing noted 91 year follow up advised)  Since her last visit in May she has lost her job and therefore her apartment at the [employer]  She has been living at [facility]  and remains in close contact with  her family,  and is attending [place] meetings regularly.  She states that she has been abstinent from alcohol  since the ER visit in August  for alcohol withdrawal .  She was treated with IV ativan .    Her last refill of librium was in May,  her last refill of lorazepam was in August for 30 tablets.    Going to AA     ROS: See pertinent positives and negatives per HPI.  Past Medical History:  Diagnosis Date   Abnormal weight loss    Alcohol abuse    Alcohol abuse with withdrawal ([place]) 10/13/2020   Anxiety    Chicken pox    Colon polyps    COPD (chronic obstructive pulmonary disease) (HCC)    Depression    GERD (gastroesophageal reflux disease)    Hernia of abdominal cavity    Hip fracture (HCC) right   Neuropathy     Past Surgical History:  Procedure Laterality Date   NO PAST SURGERIES      Family History  Problem Relation Age of Onset   Hypertension Mother    Osteoporosis Mother    Meniere's disease Mother    Cancer Father 68       prostate   Colon cancer Paternal Grandmother    Alcohol abuse Other     SOCIAL HX  reports that she has been smoking cigarettes. She has a 20.00 pack-year smoking history. She has never used smokeless tobacco. She reports that she does not currently use alcohol after a past usage of about 42.0 standard drinks per week. She reports that she  does not use drugs.    Current Outpatient Medications:    acetaminophen (TYLENOL) 500 MG tablet, Take 500 mg by mouth every 6 (six) hours as needed., Disp: , Rfl:    amLODipine (NORVASC) 5 MG tablet, TAKE 1 TABLET BY MOUTH TWICE DAILY, Disp: 180 tablet, Rfl: 1   cyanocobalamin (,VITAMIN B-12,) 1000 MCG/ML injection, Inject 1 mL (1,000 mcg total) into the muscle every 14 (fourteen) days., Disp: 6 mL, Rfl: 3   cyclobenzaprine (FLEXERIL) 10 MG tablet, Take 1 tablet (10 mg total) by mouth 3 (three) times daily as needed for muscle spasms. Involving the legs and feet, Disp: 90 tablet, Rfl: 1   escitalopram  (LEXAPRO) 10 MG tablet, Take 1 tablet (10 mg total) by mouth daily., Disp: 90 tablet, Rfl: 0   gabapentin (NEURONTIN) 400 MG capsule, Take 2 capsules (800 mg total) by mouth 3 (three) times daily., Disp: 180 capsule, Rfl: 5   LORazepam (ATIVAN) 0.5 MG tablet, Take 0.5 mg by mouth daily as needed., Disp: , Rfl:    omeprazole (PRILOSEC) 40 MG capsule, Take 1 capsule (40 mg total) by mouth 2 (two) times daily., Disp: 180 capsule, Rfl: 1   Syringe/Needle, Disp, (SYRINGE 3CC/25GX1") 25G X 1" 3 ML MISC, Use for b12 injections, Disp: 50 each, Rfl: 0   traMADol (ULTRAM) 50 MG tablet, TAKE 1 TABLET BY MOUTH 2 TIMES DAILY AS NEEDED FOR NEUROPATHY., Disp: 60 tablet, Rfl: 1   traZODone (DESYREL) 50 MG tablet, TAKE 1/2-1 TABLET BY MOUTH 30-60 MINUTESBEFORE BEDTIME., Disp: 90 tablet, Rfl: 1  Current Facility-Administered Medications:    cyanocobalamin ((VITAMIN B-12)) injection 1,000 mcg, 1,000 mcg, Intramuscular, Q14 Days, Crecencio Mc, MD, 1,000 mcg at 05/20/20 1519  EXAM:  General appearance: alert, cooperative and articulate.  No signs of being in distress  Lungs: not short of breath ,  No cough, speaking in full sentences  Psych: affect normal,dspeech is articulate and non pressured .  Denies suicidal thoughts     ASSESSMENT AND PLAN:  Discussed the following assessment and plan:  Alcohol dependence with alcohol-induced mood disorder (HCC)  Alcoholic hepatitis without ascites  Metabolic acidosis, increased anion gap (IAG)  Thrombocytopenia (HCC)  Abdominal aortic atherosclerosis (HCC)  Alcohol dependence with alcohol-induced mood disorder ([place]) She is no longer taking librium or lorazepam.  Her demeanor on the phone today suggests she may be less than forthcoming about her sobriety.  I will not refill any benzodiazepines during a telephone visit, but ot her credit she has not asked for a refill.   Alcoholic hepatitis without ascites Enzymes remain elevated but less so in August.   Advised to repeat labs this month  Metabolic acidosis, increased anion gap (IAG) Resolved after IV hydration during August ER visit . Hyponatremia stable   Lab Results  Component Value Date   NA 132 (L) 10/14/2020   K 3.4 (L) 10/14/2020   CL 94 (L) 10/14/2020   CO2 24 10/14/2020     Thrombocytopenia (HCC) Mild, with no evidence or cirrhosis or  portal hypertension.  Will repeat In November,   Lab Results  Component Value Date   WBC 5.0 10/14/2020   HGB 12.7 10/14/2020   HCT 35.5 (L) 10/14/2020   MCV 100.3 (H) 10/14/2020   PLT 135 (L) 10/14/2020     Abdominal aortic atherosclerosis ([place]) Reviewed findings of prior CT scan today..  Patient has elevated liver enzymes and needs  To schedule repeat labs andOV to discuss    I discussed the assessment  and treatment plan with the patient. The patient was provided an opportunity to ask questions and all were answered. The patient agreed with the plan and demonstrated an understanding of the instructions.   The patient was advised to call back or seek an in-person evaluation if the symptoms worsen or if the condition fails to improve as anticipated.   I spent 30 minutes dedicated to the care of this patient on the date of this encounter to include pre-visit review of his medical history,  non Face-to-face time with the patient , and post visit ordering of testing and therapeutics.    Crecencio Mc, MD

## 2020-12-18 NOTE — Patient Instructions (Addendum)
I AM STARTING YOU ON LEXAPRO FOR DEPRESSION  Please start the Lexapro (escitalopram) at 1/2 tablet daily  with breakfast or lunch  for the first few days to avoid nausea.  You can increase to a full tablet after 4 days if you have not developed side effects of nausea.  Once you are taking a full tablet, reduce your trazodone  dose to 50 mg (new prescription for lower dose sent to [facility])   Please schedule a follow up in 4 weeks

## 2020-12-21 ENCOUNTER — Encounter: Payer: Self-pay | Admitting: Internal Medicine

## 2020-12-21 DIAGNOSIS — I700 Atherosclerosis of aorta: Secondary | ICD-10-CM | POA: Insufficient documentation

## 2020-12-21 NOTE — Assessment & Plan Note (Addendum)
Mild, with no evidence or cirrhosis or  portal hypertension.  Will repeat In November,   Lab Results  Component Value Date   WBC 5.0 10/14/2020   HGB 12.7 10/14/2020   HCT 35.5 (L) 10/14/2020   MCV 100.3 (H) 10/14/2020   PLT 135 (L) 10/14/2020

## 2020-12-21 NOTE — Assessment & Plan Note (Signed)
She is no longer taking librium or lorazepam.  Her demeanor on the phone today suggests she may be less than forthcoming about her sobriety.  I will not refill any benzodiazepines during a telephone visit, but ot her credit she has not asked for a refill.

## 2020-12-21 NOTE — Assessment & Plan Note (Signed)
Reviewed findings of prior CT scan today..  Patient has elevated liver enzymes and needs  To schedule repeat labs andOV to discuss 

## 2020-12-21 NOTE — Assessment & Plan Note (Signed)
Resolved after IV hydration during August ER visit . Hyponatremia stable   Lab Results  Component Value Date   NA 132 (L) 10/14/2020   K 3.4 (L) 10/14/2020   CL 94 (L) 10/14/2020   CO2 24 10/14/2020

## 2020-12-21 NOTE — Assessment & Plan Note (Signed)
Enzymes remain elevated but less so in August.  Advised to repeat labs this month

## 2021-01-03 ENCOUNTER — Ambulatory Visit: Payer: PPO

## 2021-01-27 ENCOUNTER — Other Ambulatory Visit: Payer: Self-pay | Admitting: Internal Medicine

## 2021-01-27 DIAGNOSIS — Z1231 Encounter for screening mammogram for malignant neoplasm of breast: Secondary | ICD-10-CM

## 2021-01-31 ENCOUNTER — Telehealth: Payer: Self-pay | Admitting: Internal Medicine

## 2021-01-31 MED ORDER — CLORAZEPATE DIPOTASSIUM 7.5 MG PO TABS: 7.5000 mg | ORAL_TABLET | Freq: Every evening | ORAL | 0 refills | Status: DC | PRN

## 2021-01-31 NOTE — Telephone Encounter (Signed)
Spoke with pt and she stated that she needs this rx sent to [facility] on [facility] because [facility] dose not have this medication.

## 2021-01-31 NOTE — Telephone Encounter (Signed)
Spoke with pt and she stated that she is unable to take the Lorazepam due to having the shakes and unable to sleep. Pt stated that she would like to see about getting the Tranxene 7.5 mg refilled because it does not cause her to have the shakes and she is able to sleep all night. Pt stated that she had some left over and has been taking them. Pt also stated that she has thrown all her medication away and she is getting her "life straight right now".

## 2021-01-31 NOTE — Telephone Encounter (Signed)
Pt called in regards to her medication she is taking. Pt states she has side effects that include shakes and insomnia. Pt states she would like to stop taking LORazepam (ATIVAN) 0.5 MG tablet And would like to possibly begin Prazene. Pt states she had some leftover from a previous time and she reports she has been able to sleep and has zero shakes.

## 2021-01-31 NOTE — Telephone Encounter (Signed)
Tranxene qty #30 sent.  Refill for 30 days only.  OFFICE VISIT NEEDED prior to any more refills

## 2021-02-04 ENCOUNTER — Ambulatory Visit: Payer: PPO

## 2021-02-07 ENCOUNTER — Encounter: Payer: Self-pay | Admitting: Internal Medicine

## 2021-02-07 ENCOUNTER — Telehealth (INDEPENDENT_AMBULATORY_CARE_PROVIDER_SITE_OTHER): Payer: PPO | Admitting: Internal Medicine

## 2021-02-07 DIAGNOSIS — G621 Alcoholic polyneuropathy: Secondary | ICD-10-CM

## 2021-02-07 DIAGNOSIS — F1024 Alcohol dependence with alcohol-induced mood disorder: Secondary | ICD-10-CM

## 2021-02-07 NOTE — Progress Notes (Signed)
Virtual Visit converted to Telephone  Note  This visit type was conducted due to national recommendations for restrictions regarding the COVID-19 pandemic (e.g. social distancing).  This format is felt to be most appropriate for this patient at this time.  All issues noted in this document were discussed and addressed.  No physical exam was performed (except for noted visual exam findings with Video Visits).   I connected withNAME@ on 02/07/21 at 11:00 AM EST by telephone and verified that I am speaking with the correct person using two identifiers. Location patient: home Location provider: work or home office Persons participating in the virtual visit: patient, provider  I discussed the limitations, risks, security and privacy concerns of performing an evaluation and management service by telephone and the availability of in person appointments. I also discussed with the patient that there may be a patient responsible charge related to this service. The patient expressed understanding and agreed to proceed.  Interactive audio and video telecommunications were attempted between this provider and patient, however failed, due to loss of virtual y.  We continued and completed visit with audio only.   Reason for visit:  Follow up on alcohol induced mood disorder   HPI:  60 yr old female with history of alcoholism with peripheral neuropathy presents for follow up on medication change.  Patient had requested a change from lorazepam to tranxene to manage her tremors.    Using tranxene once daily,  at night for insomnia, which is also preventing the morning tremors.  Has also started taking lexapro and feels it is helping her depression. .  No longer using lorazepam .  She has not used alcohol in over a month .  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  [name] Spending holiday  with  her family, (daughters and mother   ROS: See pertinent positives and negatives per HPI.  Past Medical History:   Diagnosis Date   Abnormal weight loss    Alcohol abuse    Alcohol abuse with withdrawal ([place]) 10/13/2020   Anxiety    Chicken pox    Colon polyps    COPD (chronic obstructive pulmonary disease) (HCC)    Depression    GERD (gastroesophageal reflux disease)    Hernia of abdominal cavity    Hip fracture (HCC) right   Neuropathy     Past Surgical History:  Procedure Laterality Date   NO PAST SURGERIES      Family History  Problem Relation Age of Onset   Hypertension Mother    Osteoporosis Mother    Meniere's disease Mother    Cancer Father 42       prostate   Colon cancer Paternal Grandmother    Alcohol abuse Other     SOCIAL HX:  reports that she has been smoking cigarettes. She has a 20.00 pack-year smoking history. She has never used smokeless tobacco. She reports that she does not currently use alcohol after a past usage of about 42.0 standard drinks per week. She reports that she does not use drugs.    Current Outpatient Medications:    acetaminophen (TYLENOL) 500 MG tablet, Take 500 mg by mouth every 6 (six) hours as needed., Disp: , Rfl:    amLODipine (NORVASC) 5 MG tablet, TAKE 1 TABLET BY MOUTH TWICE DAILY, Disp: 180 tablet, Rfl: 1   clorazepate (TRANXENE-T) 7.5 MG tablet, Take 1 tablet (7.5 mg total) by mouth at bedtime as needed for anxiety., Disp: 30 tablet, Rfl: 0   cyanocobalamin (,VITAMIN  B-12,) 1000 MCG/ML injection, Inject 1 mL (1,000 mcg total) into the muscle every 14 (fourteen) days., Disp: 6 mL, Rfl: 3   escitalopram (LEXAPRO) 10 MG tablet, Take 1 tablet (10 mg total) by mouth daily., Disp: 90 tablet, Rfl: 0   fluticasone (FLONASE) 50 MCG/ACT nasal spray, Place 2 sprays into both nostrils daily., Disp: , Rfl:    gabapentin (NEURONTIN) 400 MG capsule, Take 2 capsules (800 mg total) by mouth 3 (three) times daily., Disp: 180 capsule, Rfl: 5   omeprazole (PRILOSEC) 40 MG capsule, Take 1 capsule (40 mg total) by mouth 2 (two) times daily., Disp: 180 capsule,  Rfl: 1   Syringe/Needle, Disp, (SYRINGE 3CC/25GX1") 25G X 1" 3 ML MISC, Use for b12 injections, Disp: 50 each, Rfl: 0   cyclobenzaprine (FLEXERIL) 10 MG tablet, Take 1 tablet (10 mg total) by mouth 3 (three) times daily as needed for muscle spasms. Involving the legs and feet (Patient not taking: Reported on 02/07/2021), Disp: 90 tablet, Rfl: 1   traMADol (ULTRAM) 50 MG tablet, TAKE 1 TABLET BY MOUTH 2 TIMES DAILY AS NEEDED FOR NEUROPATHY. (Patient not taking: Reported on 02/07/2021), Disp: 60 tablet, Rfl: 1   traZODone (DESYREL) 50 MG tablet, TAKE 1/2-1 TABLET BY MOUTH 30-60 MINUTESBEFORE BEDTIME. (Patient not taking: Reported on 02/07/2021), Disp: 90 tablet, Rfl: 1  Current Facility-Administered Medications:    cyanocobalamin ((VITAMIN B-12)) injection 1,000 mcg, 1,000 mcg, Intramuscular, Q14 Days, Crecencio Mc, MD, 1,000 mcg at 05/20/20 1519  EXAM:   General impression: alert, calm, cooperative and articulate.  No signs of being in distress  Lungs: speech is fluent sentence length suggests that patient is not short of breath and not punctuated by cough, sneezing or sniffing. [name]   Psych/NEURO: pleasant and cooperative, no obvious depression or anxiety, speech and thought processing grossly intact  ASSESSMENT AND PLAN:  Discussed the following assessment and plan:  Alcohol dependence with alcohol-induced mood disorder (HCC)  Alcoholic peripheral neuropathy (HCC)  Alcohol dependence with alcohol-induced mood disorder (HCC) Continue lexapro,  and tranxene qhs.  She is seeking gainful employment,  is spending the holidays with her family and is committed to alcohol abstinence.  Follow up 3 months   Alcoholic peripheral neuropathy (HCC) Her pain is managed currently with gabapentin only .  She has discontinued tramadol (Refill history confirmed via [place] Controlled Substance databas, accessed by me today  last refill Oct 19 for #14 tablets)    I discussed the assessment and treatment plan  with the patient. The patient was provided an opportunity to ask questions and all were answered. The patient agreed with the plan and demonstrated an understanding of the instructions.   The patient was advised to call back or seek an in-person evaluation if the symptoms worsen or if the condition fails to improve as anticipated.   I spent 22 minutes dedicated to the care of this patient on the date of this encounter to include pre-visit review of her medical history,  non Face-to-face time with the patient , and post visit ordering of testing and therapeutics.    Crecencio Mc, MD

## 2021-02-07 NOTE — Assessment & Plan Note (Signed)
Her pain is managed currently with gabapentin only .  She has discontinued tramadol (Refill history confirmed via [place] Controlled Substance databas, accessed by me today  last refill Oct 19 for #14 tablets)

## 2021-02-07 NOTE — Assessment & Plan Note (Signed)
Continue lexapro,  and tranxene qhs.  She is seeking gainful employment,  is spending the holidays with her family and is committed to alcohol abstinence.  Follow up 3 months

## 2021-03-22 ENCOUNTER — Emergency Department: Payer: PPO

## 2021-03-22 ENCOUNTER — Other Ambulatory Visit: Payer: Self-pay

## 2021-03-22 DIAGNOSIS — Z7951 Long term (current) use of inhaled steroids: Secondary | ICD-10-CM | POA: Insufficient documentation

## 2021-03-22 DIAGNOSIS — Z79899 Other long term (current) drug therapy: Secondary | ICD-10-CM | POA: Diagnosis not present

## 2021-03-22 DIAGNOSIS — K8020 Calculus of gallbladder without cholecystitis without obstruction: Secondary | ICD-10-CM | POA: Diagnosis not present

## 2021-03-22 DIAGNOSIS — R109 Unspecified abdominal pain: Secondary | ICD-10-CM | POA: Diagnosis present

## 2021-03-22 DIAGNOSIS — R079 Chest pain, unspecified: Secondary | ICD-10-CM | POA: Diagnosis not present

## 2021-03-22 DIAGNOSIS — R519 Headache, unspecified: Secondary | ICD-10-CM | POA: Diagnosis not present

## 2021-03-22 DIAGNOSIS — F1010 Alcohol abuse, uncomplicated: Secondary | ICD-10-CM | POA: Insufficient documentation

## 2021-03-22 DIAGNOSIS — N390 Urinary tract infection, site not specified: Secondary | ICD-10-CM | POA: Diagnosis not present

## 2021-03-22 DIAGNOSIS — N261 Atrophy of kidney (terminal): Secondary | ICD-10-CM | POA: Diagnosis not present

## 2021-03-22 DIAGNOSIS — E876 Hypokalemia: Secondary | ICD-10-CM | POA: Diagnosis not present

## 2021-03-22 DIAGNOSIS — R0789 Other chest pain: Secondary | ICD-10-CM | POA: Diagnosis not present

## 2021-03-22 DIAGNOSIS — E860 Dehydration: Secondary | ICD-10-CM | POA: Diagnosis not present

## 2021-03-22 DIAGNOSIS — S2242XA Multiple fractures of ribs, left side, initial encounter for closed fracture: Secondary | ICD-10-CM | POA: Diagnosis not present

## 2021-03-22 DIAGNOSIS — K449 Diaphragmatic hernia without obstruction or gangrene: Secondary | ICD-10-CM | POA: Diagnosis not present

## 2021-03-22 DIAGNOSIS — J449 Chronic obstructive pulmonary disease, unspecified: Secondary | ICD-10-CM | POA: Diagnosis not present

## 2021-03-22 DIAGNOSIS — R42 Dizziness and giddiness: Secondary | ICD-10-CM | POA: Insufficient documentation

## 2021-03-22 DIAGNOSIS — I700 Atherosclerosis of aorta: Secondary | ICD-10-CM | POA: Diagnosis not present

## 2021-03-22 DIAGNOSIS — N281 Cyst of kidney, acquired: Secondary | ICD-10-CM | POA: Diagnosis not present

## 2021-03-22 LAB — CBC
HCT: 38.6 % (ref 36.0–46.0)
Hemoglobin: 13.7 g/dL (ref 12.0–15.0)
MCH: 36.9 pg — ABNORMAL HIGH (ref 26.0–34.0)
MCHC: 35.5 g/dL (ref 30.0–36.0)
MCV: 104 fL — ABNORMAL HIGH (ref 80.0–100.0)
Platelets: 152 10*3/uL (ref 150–400)
RBC: 3.71 MIL/uL — ABNORMAL LOW (ref 3.87–5.11)
RDW: 14.4 % (ref 11.5–15.5)
WBC: 6.9 10*3/uL (ref 4.0–10.5)
nRBC: 0 % (ref 0.0–0.2)

## 2021-03-22 LAB — TROPONIN I (HIGH SENSITIVITY): Troponin I (High Sensitivity): 7 ng/L (ref <18)

## 2021-03-22 LAB — COMPREHENSIVE METABOLIC PANEL
ALT: 37 U/L (ref 0–44)
AST: 119 U/L — ABNORMAL HIGH (ref 15–41)
Albumin: 3.2 g/dL — ABNORMAL LOW (ref 3.5–5.0)
Alkaline Phosphatase: 91 U/L (ref 38–126)
Anion gap: 16 — ABNORMAL HIGH (ref 5–15)
BUN: 5 mg/dL — ABNORMAL LOW (ref 6–20)
CO2: 27 mmol/L (ref 22–32)
Calcium: 8.7 mg/dL — ABNORMAL LOW (ref 8.9–10.3)
Chloride: 91 mmol/L — ABNORMAL LOW (ref 98–111)
Creatinine, Ser: 0.65 mg/dL (ref 0.44–1.00)
GFR, Estimated: >60 mL/min (ref >60)
Glucose, Bld: 90 mg/dL (ref 70–99)
Potassium: 3 mmol/L — ABNORMAL LOW (ref 3.5–5.1)
Sodium: 134 mmol/L — ABNORMAL LOW (ref 135–145)
Total Bilirubin: 1 mg/dL (ref 0.3–1.2)
Total Protein: 6.5 g/dL (ref 6.5–8.1)

## 2021-03-22 NOTE — ED Triage Notes (Signed)
Pt states is having left sided chest pain. Pt states she feels like she is dehydrated. Pt states has consumed ETOH pta. Pt states has been nauseated for several days leading to decreased po intake.

## 2021-03-23 ENCOUNTER — Emergency Department: Payer: PPO

## 2021-03-23 ENCOUNTER — Emergency Department
Admission: EM | Admit: 2021-03-23 | Discharge: 2021-03-23 | Disposition: A | Discharge status: Home / Self Care | Payer: PPO | Attending: Emergency Medicine | Admitting: Emergency Medicine

## 2021-03-23 DIAGNOSIS — S2242XA Multiple fractures of ribs, left side, initial encounter for closed fracture: Secondary | ICD-10-CM | POA: Diagnosis not present

## 2021-03-23 DIAGNOSIS — N261 Atrophy of kidney (terminal): Secondary | ICD-10-CM | POA: Diagnosis not present

## 2021-03-23 DIAGNOSIS — E876 Hypokalemia: Secondary | ICD-10-CM | POA: Diagnosis not present

## 2021-03-23 DIAGNOSIS — I700 Atherosclerosis of aorta: Secondary | ICD-10-CM | POA: Diagnosis not present

## 2021-03-23 DIAGNOSIS — R0789 Other chest pain: Secondary | ICD-10-CM | POA: Diagnosis not present

## 2021-03-23 DIAGNOSIS — N390 Urinary tract infection, site not specified: Secondary | ICD-10-CM

## 2021-03-23 DIAGNOSIS — K8020 Calculus of gallbladder without cholecystitis without obstruction: Secondary | ICD-10-CM

## 2021-03-23 DIAGNOSIS — F1010 Alcohol abuse, uncomplicated: Secondary | ICD-10-CM

## 2021-03-23 DIAGNOSIS — N281 Cyst of kidney, acquired: Secondary | ICD-10-CM | POA: Diagnosis not present

## 2021-03-23 DIAGNOSIS — K449 Diaphragmatic hernia without obstruction or gangrene: Secondary | ICD-10-CM | POA: Diagnosis not present

## 2021-03-23 LAB — ETHANOL: Alcohol, Ethyl (B): 277 mg/dL — ABNORMAL HIGH (ref <10)

## 2021-03-23 LAB — URINE DRUG SCREEN, QUALITATIVE (ARMC ONLY)
Amphetamines, Ur Screen: NOT DETECTED
Barbiturates, Ur Screen: NOT DETECTED
Benzodiazepine, Ur Scrn: POSITIVE — AB
Cannabinoid 50 Ng, Ur ~~LOC~~: NOT DETECTED
Cocaine Metabolite,Ur ~~LOC~~: NOT DETECTED
MDMA (Ecstasy)Ur Screen: NOT DETECTED
Methadone Scn, Ur: NOT DETECTED
Opiate, Ur Screen: NOT DETECTED
Phencyclidine (PCP) Ur S: NOT DETECTED
Tricyclic, Ur Screen: NOT DETECTED

## 2021-03-23 LAB — URINALYSIS, ROUTINE W REFLEX MICROSCOPIC
Bilirubin Urine: NEGATIVE
Glucose, UA: NEGATIVE mg/dL
Hgb urine dipstick: NEGATIVE
Ketones, ur: NEGATIVE mg/dL
Nitrite: POSITIVE — AB
Protein, ur: NEGATIVE mg/dL
Specific Gravity, Urine: <1.005 — ABNORMAL LOW (ref 1.005–1.030)
pH: 6 (ref 5.0–8.0)

## 2021-03-23 LAB — TROPONIN I (HIGH SENSITIVITY): Troponin I (High Sensitivity): 7 ng/L (ref <18)

## 2021-03-23 LAB — URINALYSIS, MICROSCOPIC (REFLEX)
Bacteria, UA: NONE SEEN
RBC / HPF: NONE SEEN RBC/hpf (ref 0–5)
Squamous Epithelial / HPF: NONE SEEN (ref 0–5)

## 2021-03-23 LAB — MAGNESIUM: Magnesium: 1.4 mg/dL — ABNORMAL LOW (ref 1.7–2.4)

## 2021-03-23 LAB — LIPASE, BLOOD: Lipase: 37 U/L (ref 11–51)

## 2021-03-23 MED ORDER — MAGNESIUM SULFATE 2 GM/50ML IV SOLN
2.0000 g | Freq: Once | INTRAVENOUS | Status: AC
  Administered 2021-03-23: 2 g via INTRAVENOUS
  Filled 2021-03-23: qty 50

## 2021-03-23 MED ORDER — POTASSIUM CHLORIDE CRYS ER 20 MEQ PO TBCR
40.0000 meq | EXTENDED_RELEASE_TABLET | Freq: Once | ORAL | Status: AC
  Administered 2021-03-23: 40 meq via ORAL
  Filled 2021-03-23: qty 2

## 2021-03-23 MED ORDER — SODIUM CHLORIDE 0.9 % IV BOLUS (SEPSIS)
1000.0000 mL | Freq: Once | INTRAVENOUS | Status: AC
  Administered 2021-03-23: 1000 mL via INTRAVENOUS

## 2021-03-23 MED ORDER — THIAMINE HCL 100 MG PO TABS: 100.0000 mg | ORAL_TABLET | Freq: Every day | ORAL | 3 refills | Status: DC

## 2021-03-23 MED ORDER — THIAMINE HCL 100 MG/ML IJ SOLN
100.0000 mg | Freq: Once | INTRAMUSCULAR | Status: AC
  Administered 2021-03-23: 100 mg via INTRAVENOUS
  Filled 2021-03-23: qty 2

## 2021-03-23 MED ORDER — NEPHRO-VITE RX 1 MG PO TABS: 1.0000 | ORAL_TABLET | Freq: Every day | ORAL | 3 refills | Status: DC

## 2021-03-23 MED ORDER — ONDANSETRON HCL 4 MG/2ML IJ SOLN
4.0000 mg | Freq: Once | INTRAMUSCULAR | Status: AC
  Administered 2021-03-23: 4 mg via INTRAVENOUS
  Filled 2021-03-23: qty 2

## 2021-03-23 MED ORDER — IOHEXOL 300 MG/ML  SOLN
100.0000 mL | Freq: Once | INTRAMUSCULAR | Status: AC | PRN
  Administered 2021-03-23: 100 mL via INTRAVENOUS
  Filled 2021-03-23: qty 100

## 2021-03-23 MED ORDER — FOSFOMYCIN TROMETHAMINE 3 G PO PACK
3.0000 g | PACK | Freq: Once | ORAL | Status: AC
  Administered 2021-03-23: 3 g via ORAL
  Filled 2021-03-23: qty 3

## 2021-03-23 MED ORDER — ONDANSETRON 4 MG PO TBDP: 4.0000 mg | ORAL_TABLET | Freq: Four times a day (QID) | ORAL | 0 refills | Status: DC | PRN
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
## 2021-03-23 NOTE — ED Notes (Signed)
Pt states she does not need to urinate

## 2021-03-23 NOTE — ED Notes (Signed)
Report to caitlin, rn.

## 2021-03-23 NOTE — ED Notes (Signed)
Pt is discharged, awaiting ride at this time.  Will monitor.

## 2021-03-24 ENCOUNTER — Telehealth: Payer: Self-pay | Admitting: Internal Medicine
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
## 2021-03-24 NOTE — Telephone Encounter (Signed)
Patient last seen 11/2020. Does Patient need an appointment?

## 2021-03-25 LAB — URINE CULTURE: Culture: >=100000 — AB

## 2021-03-26 ENCOUNTER — Other Ambulatory Visit: Payer: Self-pay

## 2021-03-26 ENCOUNTER — Ambulatory Visit (INDEPENDENT_AMBULATORY_CARE_PROVIDER_SITE_OTHER): Payer: PPO

## 2021-03-26 DIAGNOSIS — E538 Deficiency of other specified B group vitamins: Secondary | ICD-10-CM | POA: Diagnosis not present

## 2021-03-26 DIAGNOSIS — M810 Age-related osteoporosis without current pathological fracture: Secondary | ICD-10-CM

## 2021-03-26 MED ORDER — DENOSUMAB 60 MG/ML ~~LOC~~ SOSY
60.0000 mg | PREFILLED_SYRINGE | Freq: Once | SUBCUTANEOUS | Status: AC
  Administered 2021-03-26: 60 mg via SUBCUTANEOUS

## 2021-03-26 MED ORDER — CYANOCOBALAMIN 1000 MCG/ML IJ SOLN
1000.0000 ug | Freq: Once | INTRAMUSCULAR | Status: AC
  Administered 2021-03-26: 1000 ug via INTRAMUSCULAR

## 2021-03-26 NOTE — Progress Notes (Signed)
Patient came in today for Prolia injection given in left arm SQ. She also received B-12 injection in right deltoid IM. Patient tolerated both well with no signs of distress.

## 2021-03-26 NOTE — Telephone Encounter (Signed)
Spoke with pt and informed her that megace is not prescribed unless for a cancer patient.

## 2021-04-01 ENCOUNTER — Ambulatory Visit: Payer: Self-pay | Admitting: Surgery

## 2021-04-01 ENCOUNTER — Other Ambulatory Visit: Payer: Self-pay

## 2021-04-01 ENCOUNTER — Ambulatory Visit (INDEPENDENT_AMBULATORY_CARE_PROVIDER_SITE_OTHER): Payer: PPO | Admitting: Surgery

## 2021-04-01 ENCOUNTER — Encounter: Payer: Self-pay | Admitting: Surgery

## 2021-04-01 VITALS — BP 145/97 | HR 81 | Temp 98.7°F | Ht 62.0 in | Wt 98.4 lb

## 2021-04-01 DIAGNOSIS — K8010 Calculus of gallbladder with chronic cholecystitis without obstruction: Secondary | ICD-10-CM | POA: Diagnosis not present

## 2021-04-01 NOTE — Progress Notes (Signed)
Patient ID: Kathy [name], female   DOB: Nov 30, 1960, 61 y.o.   MRN: [id number]  Chief Complaint: Gallstones  History of Present Illness Kathy [name] is a 61 y.o. female with gallstones diagnosed on ER visit from January 29.  She presented with complaint of abdominal pain associated nausea and vomiting.  She admits to being an alcoholic and having resumed drinking alcohol again after a 5-year period of sobriety.  Gallstones were identified coincidentally on CT scan examination.  Decent sized hiatal hernia is also noted with a history of reflux as well.  Currently taking a PPI.   Past Medical History Past Medical History:  Diagnosis Date   Abnormal weight loss    Alcohol abuse    Alcohol abuse with withdrawal ([place]) 10/13/2020   Anxiety    Chicken pox    Colon polyps    COPD (chronic obstructive pulmonary disease) (HCC)    Depression    GERD (gastroesophageal reflux disease)    Hernia of abdominal cavity    Hip fracture (HCC) right   Neuropathy       Past Surgical History:  Procedure Laterality Date   NO PAST SURGERIES      Allergies  Allergen Reactions   Aspirin Swelling    unknown   Lyrica [Pregabalin] Other (See Comments)    Blurred vision     Current Outpatient Medications  Medication Sig Dispense Refill   acetaminophen (TYLENOL) 500 MG tablet Take 500 mg by mouth every 6 (six) hours as needed.     amLODipine (NORVASC) 5 MG tablet TAKE 1 TABLET BY MOUTH TWICE DAILY 180 tablet 1   B Complex-C-Folic Acid (B COMPLEX-VITAMIN C-FOLIC ACID) 1 MG tablet Take 1 tablet by mouth daily with breakfast. 90 tablet 3   cyanocobalamin (,VITAMIN B-12,) 1000 MCG/ML injection Inject 1 mL (1,000 mcg total) into the muscle every 14 (fourteen) days. 6 mL 3   omeprazole (PRILOSEC) 40 MG capsule Take 1 capsule (40 mg total) by mouth 2 (two) times daily. 180 capsule 1   Syringe/Needle, Disp, (SYRINGE 3CC/25GX1") 25G X 1" 3 ML MISC Use for b12 injections 50 each 0   Current  Facility-Administered Medications  Medication Dose Route Frequency Provider Last Rate Last Admin   cyanocobalamin ((VITAMIN B-12)) injection 1,000 mcg  1,000 mcg Intramuscular Q14 Days Crecencio Mc, MD   1,000 mcg at 05/20/20 1519    Family History Family History  Problem Relation Age of Onset   Hypertension Mother    Osteoporosis Mother    Meniere's disease Mother    Cancer Father 7       prostate   Colon cancer Paternal Grandmother    Alcohol abuse Other       Social History Social History   Tobacco Use   Smoking status: Every Day    Packs/day: 0.50    Years: 40.00    Pack years: 20.00    Types: Cigarettes   Smokeless tobacco: Never   Tobacco comments:    smokes one pack/week currently  Vaping Use   Vaping Use: Never used  Substance Use Topics   Alcohol use: Not Currently    Alcohol/week: 42.0 standard drinks    Types: 42 Glasses of wine per week    Comment: since late April    Drug use: No     Review of Systems  Constitutional: Negative.   HENT: Negative.    Eyes: Negative.   Respiratory:  Positive for cough and shortness of breath.   Cardiovascular: Negative.  Gastrointestinal:  Positive for diarrhea, heartburn and nausea.  Genitourinary: Negative.   Skin: Negative.   Neurological:  Positive for dizziness.  Psychiatric/Behavioral:  Positive for depression.      Physical Exam There were no vitals taken for this visit.   CONSTITUTIONAL: Well developed, and nourished, appropriately responsive and aware without distress.   EYES: Sclera non-icteric.   EARS, NOSE, MOUTH AND THROAT: Mask worn.  She has some ecchymotic changes around her right eye along with blood-tinged sclera laterally.   Hearing is intact to voice.  NECK: Trachea is midline, and there is no jugular venous distension.  LYMPH NODES:  Lymph nodes in the neck are not enlarged. RESPIRATORY:  Lungs are clear, and breath sounds are equal bilaterally. Normal respiratory effort without  pathologic use of accessory muscles. CARDIOVASCULAR: Heart is regular in rate and rhythm. GI: The abdomen is  soft, nontender, and nondistended. There were no palpable masses. I did not appreciate hepatosplenomegaly. There were normal bowel sounds. MUSCULOSKELETAL:  Symmetrical muscle tone appreciated in all four extremities.    SKIN: Skin turgor is normal. No pathologic skin lesions appreciated.  NEUROLOGIC:  Motor and sensation appear grossly normal.  Cranial nerves are grossly without defect. PSYCH:  Alert and oriented to person, place and time. Affect is appropriate for situation.  Data Reviewed I have personally reviewed what is currently available of the patient's imaging, recent labs and medical records.   Labs:  CBC Latest Ref Rng & Units 03/22/2021 10/14/2020 10/13/2020  WBC 4.0 - 10.5 K/uL 6.9 5.0 6.7  Hemoglobin 12.0 - 15.0 g/dL 13.7 12.7 13.0  Hematocrit 36.0 - 46.0 % 38.6 35.5(L) 37.1  Platelets 150 - 400 K/uL 152 135(L) 76(L)   CMP Latest Ref Rng & Units 03/22/2021 10/14/2020 10/13/2020  Glucose 70 - 99 mg/dL 90 93 122(H)  BUN 6 - 20 mg/dL 5(L) 9 17  Creatinine 0.44 - 1.00 mg/dL 0.65 0.44 0.87  Sodium 135 - 145 mmol/L 134(L) 132(L) 135  Potassium 3.5 - 5.1 mmol/L 3.0(L) 3.4(L) 4.3  Chloride 98 - 111 mmol/L 91(L) 94(L) 95(L)  CO2 22 - 32 mmol/L 27 24 11(L)  Calcium 8.9 - 10.3 mg/dL 8.7(L) 8.5(L) 9.0  Total Protein 6.5 - 8.1 g/dL 6.5 6.8 8.3(H)  Total Bilirubin 0.3 - 1.2 mg/dL 1.0 1.9(H) 1.6(H)  Alkaline Phos 38 - 126 U/L 91 58 76  AST 15 - 41 U/L 119(H) 93(H) 200(H)  ALT 0 - 44 U/L 37 57(H) 88(H)      Imaging: Radiology review:   CLINICAL DATA:  Right upper quadrant pain for 48 hours   EXAM: ULTRASOUND ABDOMEN LIMITED RIGHT UPPER QUADRANT   COMPARISON:  None.   FINDINGS: Gallbladder:   Round echogenic nonmobile focus without shadowing along the gallbladder wall measures 0.8 x 0.7 x 0.5 cm. The structure is modestly echogenic but not significantly hyperechoic.  Additionally, multiple mobile echogenic foci with posterior acoustic shadowing are present within the gallbladder lumen consistent with cholelithiasis. The stones are layering with a small amount of heterogeneous echogenic material consistent with sludge. No evidence of gallbladder wall thickening or pericholecystic fluid.   Common bile duct:   Diameter: Normal at 3 mm   Liver:   Increased echogenicity of the hepatic parenchyma with coarsening of the echotexture. No discrete hepatic lesion. Normal contour. Portal vein is patent on color Doppler imaging with normal direction of blood flow towards the liver.   Other: None.   IMPRESSION: 1. Cholelithiasis without secondary sonographic findings of acute cholecystitis. 2.  Small 0.8 mm nonmobile echogenic focus adherent to the gallbladder wall. Differential considerations include benign polyp, adenoma, or early gallbladder cancer. Recommend follow-up with ultrasound in 1 year. If there is evidence of growth at that time, then surgical consultation would be recommended. This recommendation follows ACR consensus guidelines: White Paper of the ACR Incidental Findings Committee II on Gallbladder and Biliary Findings. J Am Coll Radiol 2013:;10:953-956. 3. Hepatic steatosis.     Electronically Signed   By: Jacqulynn Cadet M.D.   On: 09/07/2020 09:52  CLINICAL DATA:  Chest pain and nausea   EXAM: CT CHEST, ABDOMEN, AND PELVIS WITH CONTRAST   TECHNIQUE: Multidetector CT imaging of the chest, abdomen and pelvis was performed following the standard protocol during bolus administration of intravenous contrast.   RADIATION DOSE REDUCTION: This exam was performed according to the departmental dose-optimization program which includes automated exposure control, adjustment of the mA and/or kV according to patient size and/or use of iterative reconstruction technique.   CONTRAST:  179mL OMNIPAQUE IOHEXOL 300 MG/ML  SOLN    COMPARISON:  None.   FINDINGS: CT CHEST FINDINGS   Cardiovascular: No significant vascular findings. Normal heart size. No pericardial effusion. Calcific aortic atherosclerosis.   Mediastinum/Nodes: Medium-sized hiatal hernia. No axillary or mediastinal lymphadenopathy.   Lungs/Pleura: Lungs are clear. No pleural effusion or pneumothorax.   Musculoskeletal: Multiple old left-sided rib fractures.   CT ABDOMEN PELVIS FINDINGS   Hepatobiliary: No focal liver abnormality is seen. There is cholelithiasis without acute cholecystitis.   Pancreas: Unremarkable. No pancreatic ductal dilatation or surrounding inflammatory changes.   Spleen: Normal in size without focal abnormality.   Adrenals/Urinary Tract: Normal adrenal glands. Severely atrophic left kidney with renal cyst measuring 6.9 cm.   Stomach/Bowel: Intermediate sized hiatal hernia. No small bowel obstruction. Sigmoid diverticulosis without acute inflammation.   Vascular/Lymphatic: Aortic atherosclerosis. No enlarged abdominal or pelvic lymph nodes.   Reproductive: Uterus and bilateral adnexa are unremarkable.   Other: No abdominal wall hernia or abnormality. No abdominopelvic ascites.   Musculoskeletal: No acute or significant osseous findings.   IMPRESSION: 1. No acute abnormality of the chest, abdomen or pelvis. 2. Medium-sized hiatal hernia. 3. Cholelithiasis without acute cholecystitis. 4. Severely atrophic left kidney with renal cyst measuring 6.9 cm.   Aortic Atherosclerosis (ICD10-I70.0).     Electronically Signed   By: Ulyses Jarred M.D.   On: 03/23/2021 02:45     Assessment    Chronic calculus cholecystitis with biliary colic  Patient Active Problem List   Diagnosis Date Noted   Abdominal aortic atherosclerosis ([place]) 12/21/2020   Elevated LFTs 10/13/2020   Thrombocytopenia ([place]) 09/32/6712   Alcoholic hepatitis without ascites 09/09/2020   Cholelithiasis 09/09/2020   Paraesophageal  hiatal hernia 09/09/2020   Gallbladder mass 09/09/2020   Constipation 05/21/2020   Mild malnutrition ([place]) 05/20/2020   Mild episode of recurrent major depressive disorder ([place]) 05/20/2020   Essential hypertension 10/03/2019   Insomnia due to anxiety and fear 09/29/2019   GAD (generalized anxiety disorder) 09/19/2018   Poor dentition 05/18/2018   Tobacco abuse 02/22/2018   Atrophy of left kidney 02/22/2018   Diverticulosis of colon without diverticulitis 02/22/2018   MDD (major depressive disorder) 09/13/2017   Osteoporosis 10/17/2016   Prediabetes 10/17/2016   Pernicious anemia 01/01/2016   Hyponatremia 09/10/2015   Hammertoe 45/80/9983   Alcoholic peripheral neuropathy ([place]) 04/23/2015   Fatty liver, alcoholic 38/25/0539   Alcohol dependence with alcohol-induced mood disorder ([place])    History of alcohol dependence ([place]) 07/13/2014  Metabolic acidosis, increased anion gap ([facility]) 07/12/2014    Plan    Robotic cholecystectomy with ICG imaging.  I recommended smoking cessation, alcohol consumption cessation. She currently seems to have control of her pyrosis with her PPI. This was discussed thoroughly.  Optimal plan is for robotic cholecystectomy.  Risks and benefits have been discussed with the patient which include but are not limited to anesthesia, bleeding, infection, biliary ductal injury or stenosis, other associated unanticipated injuries affiliated with laparoscopic surgery.  I believe there is the desire to proceed, interpreter utilized as needed.  Questions elicited and answered to satisfaction.  No guarantees ever expressed or implied.   Face-to-face time spent with the patient and accompanying care providers(if present) was 45 minutes, with more than 50% of the time spent counseling, educating, and coordinating care of the patient.    These notes generated with voice recognition software. I apologize for typographical errors.  Ronny Bacon M.D., FACS 04/01/2021, 1:50  PM

## 2021-04-01 NOTE — H&P (View-Only) (Signed)
Patient ID: Kathy [name], female   DOB: 03-23-1960, 61 y.o.   MRN: [id number]  Chief Complaint: Gallstones  History of Present Illness Kathy [name] is a 61 y.o. female with gallstones diagnosed on ER visit from January 29.  She presented with complaint of abdominal pain associated nausea and vomiting.  She admits to being an alcoholic and having resumed drinking alcohol again after a 5-year period of sobriety.  Gallstones were identified coincidentally on CT scan examination.  Decent sized hiatal hernia is also noted with a history of reflux as well.  Currently taking a PPI.   Past Medical History Past Medical History:  Diagnosis Date   Abnormal weight loss    Alcohol abuse    Alcohol abuse with withdrawal ([place]) 10/13/2020   Anxiety    Chicken pox    Colon polyps    COPD (chronic obstructive pulmonary disease) (HCC)    Depression    GERD (gastroesophageal reflux disease)    Hernia of abdominal cavity    Hip fracture (HCC) right   Neuropathy       Past Surgical History:  Procedure Laterality Date   NO PAST SURGERIES      Allergies  Allergen Reactions   Aspirin Swelling    unknown   Lyrica [Pregabalin] Other (See Comments)    Blurred vision     Current Outpatient Medications  Medication Sig Dispense Refill   acetaminophen (TYLENOL) 500 MG tablet Take 500 mg by mouth every 6 (six) hours as needed.     amLODipine (NORVASC) 5 MG tablet TAKE 1 TABLET BY MOUTH TWICE DAILY 180 tablet 1   B Complex-C-Folic Acid (B COMPLEX-VITAMIN C-FOLIC ACID) 1 MG tablet Take 1 tablet by mouth daily with breakfast. 90 tablet 3   cyanocobalamin (,VITAMIN B-12,) 1000 MCG/ML injection Inject 1 mL (1,000 mcg total) into the muscle every 14 (fourteen) days. 6 mL 3   omeprazole (PRILOSEC) 40 MG capsule Take 1 capsule (40 mg total) by mouth 2 (two) times daily. 180 capsule 1   Syringe/Needle, Disp, (SYRINGE 3CC/25GX1") 25G X 1" 3 ML MISC Use for b12 injections 50 each 0   Current  Facility-Administered Medications  Medication Dose Route Frequency Provider Last Rate Last Admin   cyanocobalamin ((VITAMIN B-12)) injection 1,000 mcg  1,000 mcg Intramuscular Q14 Days Crecencio Mc, MD   1,000 mcg at 05/20/20 1519    Family History Family History  Problem Relation Age of Onset   Hypertension Mother    Osteoporosis Mother    Meniere's disease Mother    Cancer Father 2       prostate   Colon cancer Paternal Grandmother    Alcohol abuse Other       Social History Social History   Tobacco Use   Smoking status: Every Day    Packs/day: 0.50    Years: 40.00    Pack years: 20.00    Types: Cigarettes   Smokeless tobacco: Never   Tobacco comments:    smokes one pack/week currently  Vaping Use   Vaping Use: Never used  Substance Use Topics   Alcohol use: Not Currently    Alcohol/week: 42.0 standard drinks    Types: 42 Glasses of wine per week    Comment: since late April    Drug use: No     Review of Systems  Constitutional: Negative.   HENT: Negative.    Eyes: Negative.   Respiratory:  Positive for cough and shortness of breath.   Cardiovascular: Negative.  Gastrointestinal:  Positive for diarrhea, heartburn and nausea.  Genitourinary: Negative.   Skin: Negative.   Neurological:  Positive for dizziness.  Psychiatric/Behavioral:  Positive for depression.      Physical Exam There were no vitals taken for this visit.   CONSTITUTIONAL: Well developed, and nourished, appropriately responsive and aware without distress.   EYES: Sclera non-icteric.   EARS, NOSE, MOUTH AND THROAT: Mask worn.  She has some ecchymotic changes around her right eye along with blood-tinged sclera laterally.   Hearing is intact to voice.  NECK: Trachea is midline, and there is no jugular venous distension.  LYMPH NODES:  Lymph nodes in the neck are not enlarged. RESPIRATORY:  Lungs are clear, and breath sounds are equal bilaterally. Normal respiratory effort without  pathologic use of accessory muscles. CARDIOVASCULAR: Heart is regular in rate and rhythm. GI: The abdomen is  soft, nontender, and nondistended. There were no palpable masses. I did not appreciate hepatosplenomegaly. There were normal bowel sounds. MUSCULOSKELETAL:  Symmetrical muscle tone appreciated in all four extremities.    SKIN: Skin turgor is normal. No pathologic skin lesions appreciated.  NEUROLOGIC:  Motor and sensation appear grossly normal.  Cranial nerves are grossly without defect. PSYCH:  Alert and oriented to person, place and time. Affect is appropriate for situation.  Data Reviewed I have personally reviewed what is currently available of the patient's imaging, recent labs and medical records.   Labs:  CBC Latest Ref Rng & Units 03/22/2021 10/14/2020 10/13/2020  WBC 4.0 - 10.5 K/uL 6.9 5.0 6.7  Hemoglobin 12.0 - 15.0 g/dL 13.7 12.7 13.0  Hematocrit 36.0 - 46.0 % 38.6 35.5(L) 37.1  Platelets 150 - 400 K/uL 152 135(L) 76(L)   CMP Latest Ref Rng & Units 03/22/2021 10/14/2020 10/13/2020  Glucose 70 - 99 mg/dL 90 93 122(H)  BUN 6 - 20 mg/dL 5(L) 9 17  Creatinine 0.44 - 1.00 mg/dL 0.65 0.44 0.87  Sodium 135 - 145 mmol/L 134(L) 132(L) 135  Potassium 3.5 - 5.1 mmol/L 3.0(L) 3.4(L) 4.3  Chloride 98 - 111 mmol/L 91(L) 94(L) 95(L)  CO2 22 - 32 mmol/L 27 24 11(L)  Calcium 8.9 - 10.3 mg/dL 8.7(L) 8.5(L) 9.0  Total Protein 6.5 - 8.1 g/dL 6.5 6.8 8.3(H)  Total Bilirubin 0.3 - 1.2 mg/dL 1.0 1.9(H) 1.6(H)  Alkaline Phos 38 - 126 U/L 91 58 76  AST 15 - 41 U/L 119(H) 93(H) 200(H)  ALT 0 - 44 U/L 37 57(H) 88(H)      Imaging: Radiology review:   CLINICAL DATA:  Right upper quadrant pain for 48 hours   EXAM: ULTRASOUND ABDOMEN LIMITED RIGHT UPPER QUADRANT   COMPARISON:  None.   FINDINGS: Gallbladder:   Round echogenic nonmobile focus without shadowing along the gallbladder wall measures 0.8 x 0.7 x 0.5 cm. The structure is modestly echogenic but not significantly hyperechoic.  Additionally, multiple mobile echogenic foci with posterior acoustic shadowing are present within the gallbladder lumen consistent with cholelithiasis. The stones are layering with a small amount of heterogeneous echogenic material consistent with sludge. No evidence of gallbladder wall thickening or pericholecystic fluid.   Common bile duct:   Diameter: Normal at 3 mm   Liver:   Increased echogenicity of the hepatic parenchyma with coarsening of the echotexture. No discrete hepatic lesion. Normal contour. Portal vein is patent on color Doppler imaging with normal direction of blood flow towards the liver.   Other: None.   IMPRESSION: 1. Cholelithiasis without secondary sonographic findings of acute cholecystitis. 2.  Small 0.8 mm nonmobile echogenic focus adherent to the gallbladder wall. Differential considerations include benign polyp, adenoma, or early gallbladder cancer. Recommend follow-up with ultrasound in 1 year. If there is evidence of growth at that time, then surgical consultation would be recommended. This recommendation follows ACR consensus guidelines: White Paper of the ACR Incidental Findings Committee II on Gallbladder and Biliary Findings. J Am Coll Radiol 2013:;10:953-956. 3. Hepatic steatosis.     Electronically Signed   By: Jacqulynn Cadet M.D.   On: 09/07/2020 09:52  CLINICAL DATA:  Chest pain and nausea   EXAM: CT CHEST, ABDOMEN, AND PELVIS WITH CONTRAST   TECHNIQUE: Multidetector CT imaging of the chest, abdomen and pelvis was performed following the standard protocol during bolus administration of intravenous contrast.   RADIATION DOSE REDUCTION: This exam was performed according to the departmental dose-optimization program which includes automated exposure control, adjustment of the mA and/or kV according to patient size and/or use of iterative reconstruction technique.   CONTRAST:  125mL OMNIPAQUE IOHEXOL 300 MG/ML  SOLN    COMPARISON:  None.   FINDINGS: CT CHEST FINDINGS   Cardiovascular: No significant vascular findings. Normal heart size. No pericardial effusion. Calcific aortic atherosclerosis.   Mediastinum/Nodes: Medium-sized hiatal hernia. No axillary or mediastinal lymphadenopathy.   Lungs/Pleura: Lungs are clear. No pleural effusion or pneumothorax.   Musculoskeletal: Multiple old left-sided rib fractures.   CT ABDOMEN PELVIS FINDINGS   Hepatobiliary: No focal liver abnormality is seen. There is cholelithiasis without acute cholecystitis.   Pancreas: Unremarkable. No pancreatic ductal dilatation or surrounding inflammatory changes.   Spleen: Normal in size without focal abnormality.   Adrenals/Urinary Tract: Normal adrenal glands. Severely atrophic left kidney with renal cyst measuring 6.9 cm.   Stomach/Bowel: Intermediate sized hiatal hernia. No small bowel obstruction. Sigmoid diverticulosis without acute inflammation.   Vascular/Lymphatic: Aortic atherosclerosis. No enlarged abdominal or pelvic lymph nodes.   Reproductive: Uterus and bilateral adnexa are unremarkable.   Other: No abdominal wall hernia or abnormality. No abdominopelvic ascites.   Musculoskeletal: No acute or significant osseous findings.   IMPRESSION: 1. No acute abnormality of the chest, abdomen or pelvis. 2. Medium-sized hiatal hernia. 3. Cholelithiasis without acute cholecystitis. 4. Severely atrophic left kidney with renal cyst measuring 6.9 cm.   Aortic Atherosclerosis (ICD10-I70.0).     Electronically Signed   By: Ulyses Jarred M.D.   On: 03/23/2021 02:45     Assessment    Chronic calculus cholecystitis with biliary colic  Patient Active Problem List   Diagnosis Date Noted   Abdominal aortic atherosclerosis ([place]) 12/21/2020   Elevated LFTs 10/13/2020   Thrombocytopenia ([place]) 28/31/5176   Alcoholic hepatitis without ascites 09/09/2020   Cholelithiasis 09/09/2020   Paraesophageal  hiatal hernia 09/09/2020   Gallbladder mass 09/09/2020   Constipation 05/21/2020   Mild malnutrition ([place]) 05/20/2020   Mild episode of recurrent major depressive disorder ([place]) 05/20/2020   Essential hypertension 10/03/2019   Insomnia due to anxiety and fear 09/29/2019   GAD (generalized anxiety disorder) 09/19/2018   Poor dentition 05/18/2018   Tobacco abuse 02/22/2018   Atrophy of left kidney 02/22/2018   Diverticulosis of colon without diverticulitis 02/22/2018   MDD (major depressive disorder) 09/13/2017   Osteoporosis 10/17/2016   Prediabetes 10/17/2016   Pernicious anemia 01/01/2016   Hyponatremia 09/10/2015   Hammertoe 16/08/3708   Alcoholic peripheral neuropathy ([place]) 04/23/2015   Fatty liver, alcoholic 62/69/4854   Alcohol dependence with alcohol-induced mood disorder ([place])    History of alcohol dependence ([place]) 07/13/2014  Metabolic acidosis, increased anion gap ([facility]) 07/12/2014    Plan    Robotic cholecystectomy with ICG imaging.  I recommended smoking cessation, alcohol consumption cessation. She currently seems to have control of her pyrosis with her PPI. This was discussed thoroughly.  Optimal plan is for robotic cholecystectomy.  Risks and benefits have been discussed with the patient which include but are not limited to anesthesia, bleeding, infection, biliary ductal injury or stenosis, other associated unanticipated injuries affiliated with laparoscopic surgery.  I believe there is the desire to proceed, interpreter utilized as needed.  Questions elicited and answered to satisfaction.  No guarantees ever expressed or implied.   Face-to-face time spent with the patient and accompanying care providers(if present) was 45 minutes, with more than 50% of the time spent counseling, educating, and coordinating care of the patient.    These notes generated with voice recognition software. I apologize for typographical errors.  Ronny Bacon M.D., FACS 04/01/2021, 1:50  PM

## 2021-04-01 NOTE — Patient Instructions (Addendum)
Our surgery scheduler Pamala Hurry will call you within 24-48 hours to get you scheduled. If you have not heard from her after 48 hours, please call our office. You will not need to get Covid tested before surgery and have the blue sheet available when she calls to write down important information.   If you have any concerns or questions, please feel free to call our office.   Cholelithiasis Cholelithiasis happens when gallstones form in the gallbladder. The gallbladder stores bile. Bile is a fluid that helps digest fats. Bile can harden and form into gallstones. If they cause a blockage, they can cause pain (gallbladder attack). What are the causes? This condition may be caused by: Some blood diseases, such as sickle cell anemia. Too much of a fat-like substance (cholesterol) in your bile. Not enough bile salts in your bile. These salts help the body absorb and digest fats. The gallbladder not emptying fully or often enough. This is common in pregnant women. What increases the risk? The following factors may make you more likely to develop this condition: Being female. Being pregnant many times. Eating a lot of fried foods, fat, and refined carbs (refined carbohydrates). Being very overweight (obese). Being older than age 35. Using medicines with female hormones in them for a long time. Losing weight fast. Having gallstones in your family. Having some health problems, such as diabetes, Crohn's disease, or liver disease. What are the signs or symptoms? Often, there may be gallstones but no symptoms. These gallstones are called silent gallstones. If a gallstone causes a blockage, you may get sudden pain. The pain: Can be in the upper right part of your belly (abdomen). Normally comes at night or after you eat. Can last an hour or more. Can spread to your right shoulder, back, or chest. Can feel like discomfort, burning, or fullness in the upper part of your belly (indigestion). If the  blockage lasts more than a few hours, you can get an infection or swelling. You may: Feel like you may vomit. Vomit. Feel bloated. Have belly pain for 5 hours or more. Feel tender in your belly, often in the upper right part and under your ribs. Have fever or chills. Have skin or the white parts of your eyes turn yellow (jaundice). Have dark pee (urine) or pale poop (stool). How is this treated? Treatment for this condition depends on how bad you feel. If you have symptoms, you may need: Home care, if symptoms are not very bad. Do not eat for 12-24 hours. Drink only water and clear liquids. Start to eat simple or clear foods after 1 or 2 days. Try broths and crackers. You may need medicines for pain or stomach upset or both. If you have an infection, you will need antibiotics. A hospital stay, if you have very bad pain or a very bad infection. Surgery to remove your gallbladder. You may need this if: Gallstones keep coming back. You have very bad symptoms. Medicines to break up gallstones. Medicines: Are best for small gallstones. May be used for up to 6-12 months. A procedure to find and take out gallstones or to break up gallstones. Follow these instructions at home: Medicines Take over-the-counter and prescription medicines only as told by your doctor. If you were prescribed an antibiotic medicine, take it as told by your doctor. Do not stop taking the antibiotic even if you start to feel better. Ask your doctor if the medicine prescribed to you requires you to avoid driving or using machinery.  Eating and drinking Drink enough fluid to keep your urine pale yellow. Drink water or clear fluids. This is important when you have pain. Eat healthy foods. Choose: Fewer fatty foods, such as fried foods. Fewer refined carbs. Avoid breads and grains that are highly processed, such as white bread and white rice. Choose whole grains, such as whole-wheat bread and brown rice. More fiber.  Almonds, fresh fruit, and beans are healthy sources. General instructions Keep a healthy weight. Keep all follow-up visits as told by your doctor. This is important. Where to find more information [employer] of Diabetes and Digestive and Kidney Diseases: [email or web address] Contact a doctor if: You have sudden pain in the upper right part of your belly. Pain might spread to your right shoulder, back, or chest. You have been diagnosed with gallstones that have no symptoms and you get: Belly pain. Discomfort, burning, or fullness in the upper part of your abdomen. You have dark urine or pale stools. Get help right away if: You have sudden pain in the upper right part of your abdomen, and the pain lasts more than 2 hours. You have pain in your abdomen, and: It lasts more than 5 hours. It keeps getting worse. You have a fever or chills. You keep feeling like you may vomit. You keep vomiting. Your skin or the white parts of your eyes turn yellow. Summary Cholelithiasis happens when gallstones form in the gallbladder. This condition may be caused by a blood disease, too much of a fat-like substance in the bile, or not enough bile salts in bile. Treatment for this condition depends on how bad you feel. If you have symptoms, do not eat or drink. You may need medicines. You may need a hospital stay for very bad pain or a very bad infection. You may need surgery if gallstones keep coming back or if you have very bad symptoms. This information is not intended to replace advice given to you by your health care provider. Make sure you discuss any questions you have with your health care provider.  Minimally Invasive Cholecystectomy Minimally invasive cholecystectomy is surgery to remove the gallbladder. The gallbladder is a pear-shaped organ that lies beneath the liver on the right side of the body. The gallbladder stores bile, which is a fluid that helps the body digest fats. Cholecystectomy  is often done to treat inflammation (irritation and swelling) of the gallbladder (cholecystitis). This condition is usually caused by a buildup of gallstones (cholelithiasis) in the gallbladder or when the fluid in the gall bladder becomes stagnant because gallstones get stuck in the ducts (tubes) and block the flow of bile. This can result in inflammation and pain. In severe cases, emergency surgery may be required. This procedure is done through small incisions in the abdomen, instead of one large incision. It is also called laparoscopic surgery. A thin scope with a camera (laparoscope) is inserted through one incision. Then surgical instruments are inserted through the other incisions. In some cases, a minimally invasive surgery may need to be changed to a surgery that is done through a larger incision. This is called open surgery. Tell a health care provider about: Any allergies you have. All medicines you are taking, including vitamins, herbs, eye drops, creams, and over-the-counter medicines. Any problems you or family members have had with anesthetic medicines. Any bleeding problems you have. Any surgeries you have had. Any medical conditions you have. Whether you are pregnant or may be pregnant. What are the risks? Generally, this is a  safe procedure. However, problems may occur, including: Infection. Bleeding. Allergic reactions to medicines. Damage to nearby structures or organs. A gallstone remaining in the common bile duct. The common bile duct carries bile from the gallbladder to the small intestine. A bile leak from the liver or cystic duct after your gallbladder is removed. What happens before the procedure? When to stop eating and drinking Follow instructions from your health care provider about what you may eat and drink before your procedure. These may include: 8 hours before the procedure Stop eating most foods. Do not eat meat, fried foods, or fatty foods. Eat only light  foods, such as toast or crackers. All liquids are okay except energy drinks and alcohol. 6 hours before the procedure Stop eating. Drink only clear liquids, such as water, clear fruit juice, black coffee, plain tea, and sports drinks. Do not drink energy drinks or alcohol. 2 hours before the procedure Stop drinking all liquids. You may be allowed to take medicines with small sips of water. If you do not follow your health care provider's instructions, your procedure may be delayed or canceled. Medicines Ask your health care provider about: Changing or stopping your regular medicines. This is especially important if you are taking diabetes medicines or blood thinners. Taking medicines such as aspirin and ibuprofen. These medicines can thin your blood. Do not take these medicines unless your health care provider tells you to take them. Taking over-the-counter medicines, vitamins, herbs, and supplements. General instructions If you will be going home right after the procedure, plan to have a responsible adult: Take you home from the hospital or clinic. You will not be allowed to drive. Care for you for the time you are told. Do not use any products that contain nicotine or tobacco for at least 4 weeks before the procedure. These products include cigarettes, chewing tobacco, and vaping devices, such as e-cigarettes. If you need help quitting, ask your health care provider. Ask your health care provider: How your surgery site will be marked. What steps will be taken to help prevent infection. These may include: Removing hair at the surgery site. Washing skin with a germ-killing soap. Taking antibiotic medicine. What happens during the procedure?  An IV will be inserted into one of your veins. You will be given one or both of the following: A medicine to help you relax (sedative). A medicine to make you fall asleep (general anesthetic). Your surgeon will make several small incisions in your  abdomen. The laparoscope will be inserted through one of the small incisions. The camera on the laparoscope will send images to a monitor in the operating room. This lets your surgeon see inside your abdomen. A gas will be pumped into your abdomen. This will expand your abdomen to give the surgeon more room to perform the surgery. Other tools that are needed for the procedure will be inserted through the other incisions. The gallbladder will be removed through one of the incisions. Your common bile duct may be examined. If stones are found in the common bile duct, they may be removed. After your gallbladder has been removed, the incisions will be closed with stitches (sutures), staples, or skin glue. Your incisions will be covered with a bandage (dressing). The procedure may vary among health care providers and hospitals. What happens after the procedure? Your blood pressure, heart rate, breathing rate, and blood oxygen level will be monitored until you leave the hospital or clinic. You will be given medicines as needed to control  your pain. You may have a drain placed in the incision. The drain will be removed a day or two after the procedure. Summary Minimally invasive cholecystectomy, also called laparoscopic cholecystectomy, is surgery to remove the gallbladder using small incisions. Tell your health care provider about all the medical conditions you have and all the medicines you are taking for those conditions. Before the procedure, follow instructions about when to stop eating and drinking and changing or stopping medicines. Plan to have a responsible adult care for you for the time you are told after you leave the hospital or clinic. This information is not intended to replace advice given to you by your health care provider. Make sure you discuss any questions you have with your health care provider. Document Revised: 08/13/2020 Document Reviewed: 08/13/2020 Elsevier Patient Education   [place].

## 2021-04-02 ENCOUNTER — Telehealth: Payer: Self-pay | Admitting: Surgery

## 2021-04-02 NOTE — Telephone Encounter (Signed)
Outgoing call is made, left message for patient to call.  Please inform patient of the following scheduled surgery:   Pre-Admission date/time, COVID Testing date and Surgery date.  Surgery Date: 04/16/21 Preadmission Testing Date: 04/09/21 (phone 8a-1p) Covid Testing Date: Not needed.    Also patient will need to call at  [phone], between 1-3:00pm the day before surgery, to find out what time to arrive for surgery.

## 2021-04-07 NOTE — Telephone Encounter (Signed)
Outgoing call is made again, patient is now informed of all dates regarding her surgery and verbalized understanding.

## 2021-04-09 ENCOUNTER — Other Ambulatory Visit: Payer: Self-pay

## 2021-04-09 ENCOUNTER — Other Ambulatory Visit
Admission: RE | Admit: 2021-04-09 | Discharge: 2021-04-09 | Disposition: A | Discharge status: Home / Self Care | Payer: PPO | Source: Ambulatory Visit | Attending: Surgery | Admitting: Surgery

## 2021-04-09 VITALS — Ht 61.5 in | Wt 100.0 lb

## 2021-04-09 DIAGNOSIS — E871 Hypo-osmolality and hyponatremia: Secondary | ICD-10-CM

## 2021-04-09 DIAGNOSIS — I10 Essential (primary) hypertension: Secondary | ICD-10-CM

## 2021-04-09 HISTORY — DX: Dyspnea, unspecified: R06.00

## 2021-04-09 NOTE — Patient Instructions (Addendum)
Your procedure is scheduled on: Wednesday April 16, 2021. Report to Day Surgery inside [facility] 2nd floor, stop by admissions desk before getting on elevator. To find out your arrival time please call [phone] between 1PM - 3PM on Tuesday February 21, 202.[name]  Remember: Instructions that are not followed completely may result in serious medical risk,  up to and including death, or upon the discretion of your surgeon and anesthesiologist your  surgery may need to be rescheduled.     _X__ 1. Do not eat food after midnight the night before your procedure.                 No chewing gum or hard candies. You may drink clear liquids up to 2 hours                 before you are scheduled to arrive for your surgery- DO not drink clear                 liquids within 2 hours of the start of your surgery.                 Clear Liquids include:  water, apple juice without pulp, clear Gatorade, G2 or                  Gatorade Zero (avoid Red/Purple/Blue), Black Coffee or Tea (Do not add                 anything to coffee or tea).  __X__2.  On the morning of surgery brush your teeth with toothpaste and water, you                may rinse your mouth with mouthwash if you wish.  Do not swallow any toothpaste or mouthwash.     _X__ 3.  No Alcohol for 24 hours before or after surgery.   _X__ 4.  Do Not Smoke or use e-cigarettes For 24 Hours Prior to Your Surgery.                 Do not use any chewable tobacco products for at least 6 hours prior to                 Surgery.  _X__  5.  Do not use any recreational drugs (marijuana, cocaine, heroin, ecstasy, MDMA or other)                For at least one week prior to your surgery.  Combination of these drugs with anesthesia                May have life threatening results.  ____  6.  Bring all medications with you on the day of surgery if instructed.   __X__  7.  Notify your doctor if there is any change in your medical  condition      (cold, fever, infections).     Do not wear jewelry, make-up, hairpins, clips or nail polish. Do not wear lotions, powders, or perfumes. You may wear deodorant. Do not shave 48 hours prior to surgery. Do not bring valuables to the hospital.    [facility] is not responsible for any belongings or valuables.  Contacts, dentures or bridgework may not be worn into surgery. Leave your suitcase in the car. After surgery it may be brought to your room. For patients admitted to the hospital, discharge time is determined by your treatment team.  Patients discharged the day of surgery will not be allowed to drive home.   Make arrangements for someone to be with you for the first 24 hours of your Same Day Discharge.   __X__ Take these medicines the morning of surgery with A SIP OF WATER:    1. amLODipine (NORVASC) 5 MG   2. omeprazole (PRILOSEC) 40 MG   3.   4.  5.  6.  ____ Fleet Enema (as directed)   __X__ Use CHG Soap (or wipes) as directed  ____ Use Benzoyl Peroxide Gel as instructed  ____ Use inhalers on the day of surgery  ____ Stop metformin 2 days prior to surgery    ____ Take 1/2 of usual insulin dose the night before surgery. No insulin the morning          of surgery.   ____ Call your PCP, cardiologist, or Pulmonologist if taking Coumadin/Plavix/aspirin and ask when to stop before your surgery.   __X__ One Week prior to surgery- Stop Anti-inflammatories such as Ibuprofen, Aleve, Advil, Motrin, meloxicam (MOBIC), diclofenac, etodolac, ketorolac, Toradol, Daypro, piroxicam, Goody's or BC powders. OK TO USE TYLENOL IF NEEDED   __X__ Stop supplements until after surgery.    ____ Bring C-Pap to the hospital.    If you have any questions regarding your pre-procedure instructions,  Please call Pre-admit Testing at [phone]

## 2021-04-10 ENCOUNTER — Telehealth: Payer: Self-pay | Admitting: Internal Medicine

## 2021-04-10 ENCOUNTER — Other Ambulatory Visit: Admission: RE | Admit: 2021-04-10 | Payer: PPO | Source: Ambulatory Visit

## 2021-04-10 NOTE — Telephone Encounter (Signed)
Pt called in stating she is having gall bladder surgery on 2/22 and she wants to heal from that before making another appointment.

## 2021-04-10 NOTE — Telephone Encounter (Signed)
FYI

## 2021-04-10 NOTE — Telephone Encounter (Signed)
Pt returning call and she does not know what the call was about

## 2021-04-10 NOTE — Telephone Encounter (Signed)
Spoke with pt to let her know I'm not sure who tried to call her that I did not see anything in the chart.

## 2021-04-11 ENCOUNTER — Telehealth: Payer: Self-pay | Admitting: Internal Medicine

## 2021-04-11 MED ORDER — CLORAZEPATE DIPOTASSIUM 7.5 MG PO TABS: 7.5000 mg | ORAL_TABLET | Freq: Every evening | ORAL | 0 refills | Status: DC | PRN

## 2021-04-11 NOTE — Telephone Encounter (Signed)
Pt requesting a refill on Clorazepate. Not in current medication list.   Refilled: 01/31/2021 Last OV: 02/07/2022 Next OV: not scheduled  Canceled appt on 04/15/2021 stating she is having gallbladder removed on 04/16/2021 and wants to wait to come in after she is healed from that.

## 2021-04-11 NOTE — Telephone Encounter (Signed)
Pt called in requesting refill on medication (clorazepate (TRANXENE-T) 7.5 MG tablet). Pt does know medication is discontinue. Pt stated that she is trying to wean herself off medication. Pt requesting callback

## 2021-04-11 NOTE — Telephone Encounter (Signed)
Refill for 30 days only.  Virtual VISIT NEEDED prior to any more refills

## 2021-04-11 NOTE — Telephone Encounter (Signed)
LMTCB

## 2021-04-14 ENCOUNTER — Other Ambulatory Visit: Payer: Self-pay

## 2021-04-14 ENCOUNTER — Encounter
Admission: RE | Admit: 2021-04-14 | Discharge: 2021-04-14 | Disposition: A | Discharge status: Home / Self Care | Payer: PPO | Source: Ambulatory Visit | Attending: Surgery | Admitting: Surgery

## 2021-04-14 ENCOUNTER — Other Ambulatory Visit: Payer: Self-pay | Admitting: Surgery

## 2021-04-14 DIAGNOSIS — E871 Hypo-osmolality and hyponatremia: Secondary | ICD-10-CM | POA: Diagnosis not present

## 2021-04-14 DIAGNOSIS — I10 Essential (primary) hypertension: Secondary | ICD-10-CM | POA: Insufficient documentation

## 2021-04-14 DIAGNOSIS — Z01812 Encounter for preprocedural laboratory examination: Secondary | ICD-10-CM | POA: Insufficient documentation

## 2021-04-14 LAB — BASIC METABOLIC PANEL
Anion gap: 16 — ABNORMAL HIGH (ref 5–15)
BUN: <5 mg/dL — ABNORMAL LOW (ref 6–20)
CO2: 23 mmol/L (ref 22–32)
Calcium: 7.2 mg/dL — ABNORMAL LOW (ref 8.9–10.3)
Chloride: 94 mmol/L — ABNORMAL LOW (ref 98–111)
Creatinine, Ser: 0.72 mg/dL (ref 0.44–1.00)
GFR, Estimated: >60 mL/min (ref >60)
Glucose, Bld: 97 mg/dL (ref 70–99)
Potassium: 2.8 mmol/L — ABNORMAL LOW (ref 3.5–5.1)
Sodium: 133 mmol/L — ABNORMAL LOW (ref 135–145)

## 2021-04-14 MED ORDER — POTASSIUM CHLORIDE CRYS ER 20 MEQ PO TBCR: 20.0000 meq | EXTENDED_RELEASE_TABLET | Freq: Two times a day (BID) | ORAL | 0 refills | Status: DC

## 2021-04-14 NOTE — Telephone Encounter (Signed)
-----   Message from Ronny Bacon, MD sent at 04/14/2021 11:23 AM EST ----- Regarding: sent Rx for KCl as pt's potassium is low. Please notify her to pick up this Rx, and begin taking now for surgery.   Thanks.   ----- Message ----- From: Buel Ream, [facility] [place] Sent: 04/14/2021  10:26 AM EST To: Ronny Bacon, MD

## 2021-04-14 NOTE — Telephone Encounter (Signed)
Spoke with pt and informed her that the medication has been sent in for 30 days only. Pt is aware that she will need to schedule an appt before this medication can be refilled again. Pt stated that she will call back after she heals from her gallbladder surgery.

## 2021-04-14 NOTE — Telephone Encounter (Signed)
Spoke with the patient and let her know to pick up this prescription and start today. She is aware.

## 2021-04-15 ENCOUNTER — Encounter: Payer: PPO | Admitting: Internal Medicine

## 2021-04-16 ENCOUNTER — Other Ambulatory Visit: Payer: Self-pay

## 2021-04-16 ENCOUNTER — Ambulatory Visit: Payer: PPO | Admitting: Anesthesiology

## 2021-04-16 ENCOUNTER — Ambulatory Visit
Admission: RE | Admit: 2021-04-16 | Discharge: 2021-04-16 | Disposition: A | Discharge status: Home / Self Care | Payer: PPO | Attending: Surgery | Admitting: Surgery

## 2021-04-16 ENCOUNTER — Encounter: Payer: Self-pay | Admitting: Surgery

## 2021-04-16 ENCOUNTER — Encounter: Payer: Self-pay | Admitting: Internal Medicine

## 2021-04-16 ENCOUNTER — Encounter
Admission: RE | Disposition: A | Discharge status: Home / Self Care | Payer: Self-pay | Source: Home / Self Care | Attending: Surgery

## 2021-04-16 DIAGNOSIS — K8010 Calculus of gallbladder with chronic cholecystitis without obstruction: Secondary | ICD-10-CM | POA: Insufficient documentation

## 2021-04-16 DIAGNOSIS — I700 Atherosclerosis of aorta: Secondary | ICD-10-CM | POA: Insufficient documentation

## 2021-04-16 DIAGNOSIS — F17210 Nicotine dependence, cigarettes, uncomplicated: Secondary | ICD-10-CM | POA: Diagnosis not present

## 2021-04-16 DIAGNOSIS — K449 Diaphragmatic hernia without obstruction or gangrene: Secondary | ICD-10-CM | POA: Insufficient documentation

## 2021-04-16 DIAGNOSIS — K219 Gastro-esophageal reflux disease without esophagitis: Secondary | ICD-10-CM | POA: Diagnosis not present

## 2021-04-16 DIAGNOSIS — K759 Inflammatory liver disease, unspecified: Secondary | ICD-10-CM | POA: Diagnosis not present

## 2021-04-16 DIAGNOSIS — N281 Cyst of kidney, acquired: Secondary | ICD-10-CM | POA: Insufficient documentation

## 2021-04-16 DIAGNOSIS — I10 Essential (primary) hypertension: Secondary | ICD-10-CM | POA: Diagnosis not present

## 2021-04-16 DIAGNOSIS — N261 Atrophy of kidney (terminal): Secondary | ICD-10-CM | POA: Insufficient documentation

## 2021-04-16 DIAGNOSIS — J449 Chronic obstructive pulmonary disease, unspecified: Secondary | ICD-10-CM | POA: Insufficient documentation

## 2021-04-16 DIAGNOSIS — K811 Chronic cholecystitis: Secondary | ICD-10-CM | POA: Diagnosis not present

## 2021-04-16 DIAGNOSIS — F411 Generalized anxiety disorder: Secondary | ICD-10-CM | POA: Diagnosis not present

## 2021-04-16 HISTORY — PX: ROBOTIC ASSISTED LAPAROSCOPIC CHOLECYSTECTOMY: SHX6521

## 2021-04-16 LAB — POCT I-STAT, CHEM 8
BUN: 4 mg/dL — ABNORMAL LOW (ref 6–20)
Calcium, Ion: 0.76 mmol/L — CL (ref 1.15–1.40)
Chloride: 96 mmol/L — ABNORMAL LOW (ref 98–111)
Creatinine, Ser: 0.7 mg/dL (ref 0.44–1.00)
Glucose, Bld: 126 mg/dL — ABNORMAL HIGH (ref 70–99)
HCT: 50 % — ABNORMAL HIGH (ref 36.0–46.0)
Hemoglobin: 17 g/dL — ABNORMAL HIGH (ref 12.0–15.0)
Potassium: 3.6 mmol/L (ref 3.5–5.1)
Sodium: 133 mmol/L — ABNORMAL LOW (ref 135–145)
TCO2: 24 mmol/L (ref 22–32)

## 2021-04-16 SURGERY — CHOLECYSTECTOMY, ROBOT-ASSISTED, LAPAROSCOPIC
Anesthesia: General

## 2021-04-16 MED ORDER — MIDAZOLAM HCL 2 MG/2ML IJ SOLN
INTRAMUSCULAR | Status: AC
  Filled 2021-04-16: qty 2

## 2021-04-16 MED ORDER — BUPIVACAINE-EPINEPHRINE (PF) 0.25% -1:200000 IJ SOLN
INTRAMUSCULAR | Status: AC
  Filled 2021-04-16: qty 30

## 2021-04-16 MED ORDER — BUPIVACAINE LIPOSOME 1.3 % IJ SUSP: 20.0000 mL | Freq: Once | INTRAMUSCULAR | Status: DC

## 2021-04-16 MED ORDER — METOPROLOL TARTRATE 5 MG/5ML IV SOLN
INTRAVENOUS | Status: DC | PRN
  Administered 2021-04-16: 1 mg via INTRAVENOUS
  Administered 2021-04-16: 2 mg via INTRAVENOUS

## 2021-04-16 MED ORDER — MIDAZOLAM HCL 2 MG/2ML IJ SOLN
INTRAMUSCULAR | Status: DC | PRN
Start: 2021-04-16 — End: 2021-04-16
  Administered 2021-04-16: 2 mg via INTRAVENOUS

## 2021-04-16 MED ORDER — IPRATROPIUM-ALBUTEROL 0.5-2.5 (3) MG/3ML IN SOLN
RESPIRATORY_TRACT | Status: AC
  Administered 2021-04-16: 3 mL via RESPIRATORY_TRACT
  Filled 2021-04-16: qty 3

## 2021-04-16 MED ORDER — CHLORHEXIDINE GLUCONATE CLOTH 2 % EX PADS
6.0000 | MEDICATED_PAD | Freq: Once | CUTANEOUS | Status: AC
  Administered 2021-04-16: 6 via TOPICAL

## 2021-04-16 MED ORDER — DEXAMETHASONE SODIUM PHOSPHATE 10 MG/ML IJ SOLN
INTRAMUSCULAR | Status: DC | PRN
Start: 2021-04-16 — End: 2021-04-16
  Administered 2021-04-16: 5 mg via INTRAVENOUS

## 2021-04-16 MED ORDER — METOPROLOL TARTRATE 5 MG/5ML IV SOLN
INTRAVENOUS | Status: AC
  Filled 2021-04-16: qty 5

## 2021-04-16 MED ORDER — ONDANSETRON HCL 4 MG/2ML IJ SOLN: 4.0000 mg | Freq: Once | INTRAMUSCULAR | Status: AC

## 2021-04-16 MED ORDER — IBUPROFEN 800 MG PO TABS: 800.0000 mg | ORAL_TABLET | Freq: Three times a day (TID) | ORAL | 0 refills | Status: DC | PRN

## 2021-04-16 MED ORDER — ACETAMINOPHEN 500 MG PO TABS
ORAL_TABLET | ORAL | Status: AC
  Administered 2021-04-16: 1000 mg via ORAL
  Filled 2021-04-16: qty 2

## 2021-04-16 MED ORDER — FENTANYL CITRATE (PF) 100 MCG/2ML IJ SOLN
INTRAMUSCULAR | Status: AC
  Filled 2021-04-16: qty 2

## 2021-04-16 MED ORDER — ROCURONIUM BROMIDE 10 MG/ML (PF) SYRINGE
PREFILLED_SYRINGE | INTRAVENOUS | Status: AC
  Filled 2021-04-16: qty 10

## 2021-04-16 MED ORDER — ONDANSETRON HCL 4 MG/2ML IJ SOLN: 4.0000 mg | Freq: Once | INTRAMUSCULAR | Status: DC | PRN

## 2021-04-16 MED ORDER — ONDANSETRON HCL 4 MG/2ML IJ SOLN
INTRAMUSCULAR | Status: DC | PRN
  Administered 2021-04-16: 4 mg via INTRAVENOUS

## 2021-04-16 MED ORDER — FENTANYL CITRATE (PF) 100 MCG/2ML IJ SOLN
INTRAMUSCULAR | Status: DC | PRN
  Administered 2021-04-16 (×2): 100 ug via INTRAVENOUS

## 2021-04-16 MED ORDER — ONDANSETRON HCL 4 MG/2ML IJ SOLN
INTRAMUSCULAR | Status: AC
  Filled 2021-04-16: qty 2

## 2021-04-16 MED ORDER — FENTANYL CITRATE (PF) 100 MCG/2ML IJ SOLN: 25.0000 ug | INTRAMUSCULAR | Status: DC | PRN

## 2021-04-16 MED ORDER — LIDOCAINE HCL (CARDIAC) PF 100 MG/5ML IV SOSY
PREFILLED_SYRINGE | INTRAVENOUS | Status: DC | PRN
  Administered 2021-04-16: 50 mg via INTRAVENOUS

## 2021-04-16 MED ORDER — ACETAMINOPHEN 500 MG PO TABS: 1000.0000 mg | ORAL_TABLET | ORAL | Status: AC

## 2021-04-16 MED ORDER — LIDOCAINE HCL (PF) 2 % IJ SOLN
INTRAMUSCULAR | Status: AC
  Filled 2021-04-16: qty 5

## 2021-04-16 MED ORDER — CEFAZOLIN SODIUM-DEXTROSE 2-4 GM/100ML-% IV SOLN
2.0000 g | INTRAVENOUS | Status: AC
  Administered 2021-04-16: 1 g via INTRAVENOUS

## 2021-04-16 MED ORDER — SUCCINYLCHOLINE CHLORIDE 200 MG/10ML IV SOSY
PREFILLED_SYRINGE | INTRAVENOUS | Status: DC | PRN
Start: 2021-04-16 — End: 2021-04-16
  Administered 2021-04-16: 60 mg via INTRAVENOUS

## 2021-04-16 MED ORDER — SUGAMMADEX SODIUM 200 MG/2ML IV SOLN
INTRAVENOUS | Status: DC | PRN
  Administered 2021-04-16: 150 mg via INTRAVENOUS

## 2021-04-16 MED ORDER — GABAPENTIN 300 MG PO CAPS
ORAL_CAPSULE | ORAL | Status: AC
  Filled 2021-04-16: qty 1

## 2021-04-16 MED ORDER — PHENYLEPHRINE HCL (PRESSORS) 10 MG/ML IV SOLN
INTRAVENOUS | Status: DC | PRN
  Administered 2021-04-16 (×4): 80 ug via INTRAVENOUS

## 2021-04-16 MED ORDER — LACTATED RINGERS IV SOLN: INTRAVENOUS | Status: DC

## 2021-04-16 MED ORDER — BUPIVACAINE-EPINEPHRINE (PF) 0.25% -1:200000 IJ SOLN
INTRAMUSCULAR | Status: DC | PRN
  Administered 2021-04-16: 30 mL

## 2021-04-16 MED ORDER — OXYCODONE HCL 5 MG PO TABS: 5.0000 mg | ORAL_TABLET | Freq: Once | ORAL | Status: DC | PRN

## 2021-04-16 MED ORDER — ORAL CARE MOUTH RINSE: 15.0000 mL | Freq: Once | OROMUCOSAL | Status: AC

## 2021-04-16 MED ORDER — CHLORHEXIDINE GLUCONATE CLOTH 2 % EX PADS: 6.0000 | MEDICATED_PAD | Freq: Once | CUTANEOUS | Status: DC

## 2021-04-16 MED ORDER — ONDANSETRON HCL 4 MG/2ML IJ SOLN
INTRAMUSCULAR | Status: AC
  Administered 2021-04-16: 4 mg via INTRAVENOUS
  Filled 2021-04-16: qty 2

## 2021-04-16 MED ORDER — INDOCYANINE GREEN 25 MG IV SOLR
2.5000 mg | Freq: Once | INTRAVENOUS | Status: AC
  Administered 2021-04-16: 2.5 mg via INTRAVENOUS
  Filled 2021-04-16: qty 1

## 2021-04-16 MED ORDER — DEXAMETHASONE SODIUM PHOSPHATE 10 MG/ML IJ SOLN
INTRAMUSCULAR | Status: AC
  Filled 2021-04-16: qty 1

## 2021-04-16 MED ORDER — IPRATROPIUM-ALBUTEROL 0.5-2.5 (3) MG/3ML IN SOLN: 3.0000 mL | Freq: Once | RESPIRATORY_TRACT | Status: AC

## 2021-04-16 MED ORDER — CHLORHEXIDINE GLUCONATE 0.12 % MT SOLN
OROMUCOSAL | Status: AC
  Administered 2021-04-16: 15 mL via OROMUCOSAL
  Filled 2021-04-16: qty 15

## 2021-04-16 MED ORDER — PROPOFOL 10 MG/ML IV BOLUS
INTRAVENOUS | Status: DC | PRN
  Administered 2021-04-16: 100 mg via INTRAVENOUS

## 2021-04-16 MED ORDER — GABAPENTIN 300 MG PO CAPS
300.0000 mg | ORAL_CAPSULE | ORAL | Status: AC
  Administered 2021-04-16: 300 mg via ORAL

## 2021-04-16 MED ORDER — CHLORHEXIDINE GLUCONATE 0.12 % MT SOLN: 15.0000 mL | Freq: Once | OROMUCOSAL | Status: AC

## 2021-04-16 MED ORDER — CEFAZOLIN SODIUM-DEXTROSE 2-4 GM/100ML-% IV SOLN
INTRAVENOUS | Status: AC
  Filled 2021-04-16: qty 100

## 2021-04-16 MED ORDER — ROCURONIUM BROMIDE 100 MG/10ML IV SOLN
INTRAVENOUS | Status: DC | PRN
Start: 2021-04-16 — End: 2021-04-16
  Administered 2021-04-16: 20 mg via INTRAVENOUS

## 2021-04-16 MED ORDER — 0.9 % SODIUM CHLORIDE (POUR BTL) OPTIME
TOPICAL | Status: DC | PRN
  Administered 2021-04-16: 50 mL

## 2021-04-16 MED ORDER — PROPOFOL 10 MG/ML IV BOLUS
INTRAVENOUS | Status: AC
  Filled 2021-04-16: qty 20

## 2021-04-16 MED ORDER — SUCCINYLCHOLINE CHLORIDE 200 MG/10ML IV SOSY
PREFILLED_SYRINGE | INTRAVENOUS | Status: AC
  Filled 2021-04-16: qty 10

## 2021-04-16 MED ORDER — HYDROCODONE-ACETAMINOPHEN 5-325 MG PO TABS: 1.0000 | ORAL_TABLET | Freq: Four times a day (QID) | ORAL | 0 refills | Status: DC | PRN

## 2021-04-16 MED ORDER — OXYCODONE HCL 5 MG/5ML PO SOLN: 5.0000 mg | Freq: Once | ORAL | Status: DC | PRN

## 2021-04-16 SURGICAL SUPPLY — 44 items
CANNULA CAP OBTURATR AIRSEAL 8 (CAP) ×2 IMPLANT
CLIP LIGATING HEM O LOK PURPLE (MISCELLANEOUS) ×2 IMPLANT
COVER TIP SHEARS 8 DVNC (MISCELLANEOUS) ×1 IMPLANT
COVER TIP SHEARS 8MM DA VINCI (MISCELLANEOUS) ×1
DERMABOND ADVANCED (GAUZE/BANDAGES/DRESSINGS) ×1
DERMABOND ADVANCED .7 DNX12 (GAUZE/BANDAGES/DRESSINGS) ×1 IMPLANT
DRAPE ARM DVNC X/XI (DISPOSABLE) ×4 IMPLANT
DRAPE COLUMN DVNC XI (DISPOSABLE) ×1 IMPLANT
DRAPE DA VINCI XI ARM (DISPOSABLE) ×4
DRAPE DA VINCI XI COLUMN (DISPOSABLE) ×1
ELECT CAUTERY BLADE 6.4 (BLADE) ×2 IMPLANT
GLOVE SURG ORTHO LTX SZ7.5 (GLOVE) ×8 IMPLANT
GOWN STRL REUS W/ TWL LRG LVL3 (GOWN DISPOSABLE) ×4 IMPLANT
GOWN STRL REUS W/TWL LRG LVL3 (GOWN DISPOSABLE) ×4
GRASPER SUT TROCAR 14GX15 (MISCELLANEOUS) IMPLANT
INFUSOR MANOMETER BAG 3000ML (MISCELLANEOUS) IMPLANT
IRRIGATION STRYKERFLOW (MISCELLANEOUS) IMPLANT
IRRIGATOR STRYKERFLOW (MISCELLANEOUS)
IRRIGATOR SUCT 8 DISP DVNC XI (IRRIGATION / IRRIGATOR) IMPLANT
IRRIGATOR SUCTION 8MM XI DISP (IRRIGATION / IRRIGATOR)
IV NS IRRIG 3000ML ARTHROMATIC (IV SOLUTION) IMPLANT
KIT PINK PAD W/HEAD ARE REST (MISCELLANEOUS) ×2
KIT PINK PAD W/HEAD ARM REST (MISCELLANEOUS) ×1 IMPLANT
KIT TURNOVER KIT A (KITS) ×2 IMPLANT
LABEL OR SOLS (LABEL) ×2 IMPLANT
MANIFOLD NEPTUNE II (INSTRUMENTS) ×2 IMPLANT
NDL INSUFFLATION 14GA 120MM (NEEDLE) IMPLANT
NEEDLE HYPO 22GX1.5 SAFETY (NEEDLE) ×2 IMPLANT
NEEDLE INSUFFLATION 14GA 120MM (NEEDLE) ×2 IMPLANT
NS IRRIG 500ML POUR BTL (IV SOLUTION) ×2 IMPLANT
PACK LAP CHOLECYSTECTOMY (MISCELLANEOUS) ×2 IMPLANT
PENCIL ELECTRO HAND CTR (MISCELLANEOUS) ×2 IMPLANT
POUCH SPECIMEN RETRIEVAL 10MM (ENDOMECHANICALS) ×2 IMPLANT
SEAL CANN UNIV 5-8 DVNC XI (MISCELLANEOUS) ×3 IMPLANT
SEAL XI 5MM-8MM UNIVERSAL (MISCELLANEOUS) ×3
SET TUBE FILTERED XL AIRSEAL (SET/KITS/TRAYS/PACK) ×2 IMPLANT
SOLUTION ELECTROLUBE (MISCELLANEOUS) ×2 IMPLANT
SPIKE FLUID TRANSFER (MISCELLANEOUS) ×2 IMPLANT
SUT MNCRL 4-0 (SUTURE) ×1
SUT MNCRL 4-0 27XMFL (SUTURE) ×1
SUT VICRYL 0 AB UR-6 (SUTURE) ×2 IMPLANT
SUTURE MNCRL 4-0 27XMF (SUTURE) ×1 IMPLANT
TROCAR Z-THREAD FIOS 11X100 BL (TROCAR) ×1 IMPLANT
WATER STERILE IRR 500ML POUR (IV SOLUTION) ×2 IMPLANT

## 2021-04-16 NOTE — Transfer of Care (Signed)
Immediate Anesthesia Transfer of Care Note  Patient: Kathy [name]  Procedure(s) Performed: XI ROBOTIC ASSISTED LAPAROSCOPIC CHOLECYSTECTOMY INDOCYANINE GREEN FLUORESCENCE IMAGING (ICG)  Patient Location: PACU  Anesthesia Type:General  Level of Consciousness: awake, alert  and drowsy  Airway & Oxygen Therapy: Patient Spontanous Breathing and Patient connected to nasal cannula oxygen  Post-op Assessment: Report given to RN and Post -op Vital signs reviewed and stable  Post vital signs: Reviewed and stable  Last Vitals:  Vitals Value Taken Time  BP 122/81 04/16/21 1216  Temp    Pulse 102 04/16/21 1220  Resp 18 04/16/21 1220  SpO2 95 % 04/16/21 1220  Vitals shown include unvalidated device data.  Last Pain:  Vitals:   04/16/21 1010  TempSrc: Tympanic  PainSc: 2          Complications: No notable events documented.

## 2021-04-16 NOTE — Interval H&P Note (Signed)
History and Physical Interval Note:  04/16/2021 10:35 AM  Kathy [name]  has presented today for surgery, with the diagnosis of chronic calculous cholecystitis.  The various methods of treatment have been discussed with the patient and family. After consideration of risks, benefits and other options for treatment, the patient has consented to  Procedure(s): XI ROBOTIC ASSISTED LAPAROSCOPIC CHOLECYSTECTOMY (N/A) [place] (ICG) (N/A) as a surgical intervention.  The patient's history has been reviewed, patient examined, no change in status, stable for surgery.  I have reviewed the patient's chart and labs.  Questions were answered to the patient's satisfaction.     Ronny Bacon

## 2021-04-16 NOTE — Anesthesia Postprocedure Evaluation (Signed)
Anesthesia Post Note  Patient: Kathy [name]  Procedure(s) Performed: XI ROBOTIC ASSISTED LAPAROSCOPIC CHOLECYSTECTOMY INDOCYANINE GREEN FLUORESCENCE IMAGING (ICG)  Patient location during evaluation: PACU Anesthesia Type: General Level of consciousness: awake and alert Pain management: pain level controlled Vital Signs Assessment: post-procedure vital signs reviewed and stable Respiratory status: spontaneous breathing, nonlabored ventilation, respiratory function stable and patient connected to nasal cannula oxygen Cardiovascular status: blood pressure returned to baseline and stable Postop Assessment: no apparent nausea or vomiting Anesthetic complications: no   No notable events documented.   Last Vitals:  Vitals:   04/16/21 1257 04/16/21 1315  BP: 110/80 101/70  Pulse: 97 95  Resp: 16 16  Temp: 36.6 C   SpO2: 97% 97%    Last Pain:  Vitals:   04/16/21 1315  TempSrc:   PainSc: 2                  Arita Miss

## 2021-04-16 NOTE — Discharge Instructions (Signed)

## 2021-04-16 NOTE — Anesthesia Procedure Notes (Signed)
Procedure Name: Intubation Date/Time: 04/16/2021 11:28 AM Performed by: Arita Miss, MD Pre-anesthesia Checklist: Patient identified, Patient being monitored, Timeout performed, Emergency Drugs available and Suction available Patient Re-evaluated:Patient Re-evaluated prior to induction Oxygen Delivery Method: Circle system utilized Preoxygenation: Pre-oxygenation with 100% oxygen Induction Type: IV induction and Rapid sequence Laryngoscope Size: Mac and 3 Grade View: Grade I Tube type: Oral Tube size: 6.5 mm Number of attempts: 1 Placement Confirmation: ETT inserted through vocal cords under direct vision, positive ETCO2 and breath sounds checked- equal and bilateral Secured at: 19 cm Tube secured with: Tape Dental Injury: Teeth and Oropharynx as per pre-operative assessment  Comments: Unremarkable RSI. MV not attempted

## 2021-04-16 NOTE — Anesthesia Preprocedure Evaluation (Signed)
Anesthesia Evaluation  Patient identified by MRN, date of birth, ID band Patient awake    Reviewed: Allergy & Precautions, NPO status , Patient's Chart, lab work & pertinent test results  History of Anesthesia Complications Negative for: history of anesthetic complications  Airway Mallampati: III  TM Distance: >3 FB Neck ROM: full    Dental  (+) Missing   Pulmonary shortness of breath and with exertion, COPD, Current Smoker and Patient abstained from smoking.,    + rhonchi  + decreased breath sounds+ wheezing      Cardiovascular Exercise Tolerance: Good hypertension, (-) angina(-) Past MI Normal cardiovascular exam     Neuro/Psych PSYCHIATRIC DISORDERS  Neuromuscular disease    GI/Hepatic hiatal hernia, GERD  ,(+) Hepatitis -  Endo/Other  negative endocrine ROS  Renal/GU Renal disease     Musculoskeletal   Abdominal   Peds  Hematology negative hematology ROS (+)   Anesthesia Other Findings Past Medical History: No date: Abnormal weight loss No date: Alcohol abuse 10/13/2020: Alcohol abuse with withdrawal ([place]) No date: Anxiety No date: Chicken pox No date: Colon polyps No date: COPD (chronic obstructive pulmonary disease) (HCC) No date: Depression No date: Dyspnea No date: GERD (gastroesophageal reflux disease) No date: Hernia of abdominal cavity right: Hip fracture (HCC) No date: Neuropathy  Past Surgical History: No date: NO PAST SURGERIES  BMI    Body Mass Index: 18.59 kg/m      Reproductive/Obstetrics negative OB ROS                             Anesthesia Physical Anesthesia Plan  ASA: 3  Anesthesia Plan: General ETT   Post-op Pain Management:    Induction: Intravenous  PONV Risk Score and Plan: Ondansetron, Dexamethasone, Midazolam and Treatment may vary due to age or medical condition  Airway Management Planned: Oral ETT  Additional Equipment:   Intra-op  Plan:   Post-operative Plan: Extubation in OR  Informed Consent: I have reviewed the patients History and Physical, chart, labs and discussed the procedure including the risks, benefits and alternatives for the proposed anesthesia with the patient or authorized representative who has indicated his/her understanding and acceptance.     Dental Advisory Given  Plan Discussed with: Anesthesiologist, CRNA and Surgeon  Anesthesia Plan Comments: (Patient consented for risks of anesthesia including but not limited to:  - adverse reactions to medications - damage to eyes, teeth, lips or other oral mucosa - nerve damage due to positioning  - sore throat or hoarseness - Damage to heart, brain, nerves, lungs, other parts of body or loss of life  Patient voiced understanding.)        Anesthesia Quick Evaluation

## 2021-04-16 NOTE — Op Note (Signed)
Robotic cholecystectomy with Indocyamine Green Ductal Imaging.   Pre-operative Diagnosis: Chronic calculus cholecystitis  Post-operative Diagnosis:  Same.  Procedure: Robotic assisted laparoscopic cholecystectomy with Indocyamine Green Ductal Imaging.   Surgeon: Ronny Bacon, M.D., FACS  Anesthesia: General. with endotracheal tube  Findings: Rounded edges of hepatic lobes, with pink peach discoloration of the entire liver.  Estimated Blood Loss: 15 mL         Drains: None         Specimens: Gallbladder           Complications: none  Procedure Details  The patient was seen again in the Holding Room.  2.5 mg dose of ICG was administered intravenously.   The benefits, complications, treatment options, risks and expected outcomes were again reviewed with the patient. The likelihood of improving the patient's symptoms with return to their baseline status is good.  The patient and/or family concurred with the proposed plan, giving informed consent, again alternatives reviewed.  The patient was taken to Operating Room, identified, and the procedure verified as robotic assisted laparoscopic cholecystectomy.  Prior to the induction of general anesthesia, antibiotic prophylaxis was administered. VTE prophylaxis was in place. General endotracheal anesthesia was then administered and tolerated well. The patient was positioned in the supine position.  After the induction, the abdomen was prepped with Chloraprep and draped in the sterile fashion.  A Time Out was held and the above information confirmed.  After local infiltration of quarter percent Marcaine with epinephrine, stab incision was made left upper quadrant.  Just below the costal margin at Palmer's point, approximately midclavicular line the Veres needle is passed with sensation of the layers to penetrate the abdominal wall and into the peritoneum.  Saline drop test is confirmed peritoneal placement.  Insufflation is initiated with carbon  dioxide to pressures of 15 mmHg.  Right infra-umbilical local infiltration with quarter percent Marcaine with epinephrine is utilized.  Made a 12 mm incision on the right periumbilical site, I advanced an optical 60mm port under direct visualization into the peritoneal cavity.  Once the peritoneum was penetrated, insufflation was initiated.  The trocar was then advanced into the abdominal cavity under direct visualization. Pneumoperitoneum was then continued with Air seal utilizing CO2 at 15 mmHg or less and tolerated well without any adverse changes in the patient's vital signs.  Two 8.5-mm ports were placed in the left lower quadrant and laterally, and one to the right lower quadrant, all under direct vision. All skin incisions  were infiltrated with a local anesthetic agent before making the incision and placing the trocars.  The patient was positioned  in reverse Trendelenburg, tilted the patient's left side down.  Da Vinci XI robot was then positioned on to the patient's left side, and docked.  The gallbladder was identified, the fundus grasped via the arm 4 Prograsp and retracted cephalad. Adhesions were lysed with scissors and cautery.  The infundibulum was identified grasped and retracted laterally, exposing the peritoneum overlying the triangle of Calot. This was then opened and dissected using cautery & scissors. An extended critical view of the cystic duct and cystic artery was obtained, aided by the ICG via FireFly which enabled ready visualization of the ductal anatomy.    The cystic duct was clearly identified and dissected to isolation.   Artery well isolated and clipped, and the cystic duct was triple clipped and divided with scissors, as close to the gallbladder neck as feasible, thus leaving two on the remaining stump.  The specimen side  of the artery is sealed with bipolar and divided with monopolar scissors.   The gallbladder was taken from the gallbladder fossa in a retrograde fashion  with the electrocautery. The gallbladder was removed and placed in an Endocatch bag.  The liver bed is inspected. Hemostasis was confirmed.  The robot was undocked and moved away from the operative field. No irrigation was utilized, minimal gallbladder fossa blood was aspirated clear.  The gallbladder and Endocatch sac were then removed through the infraumbilical port site.   Inspection of the right upper quadrant was performed. No bleeding, bile duct injury or leak, or bowel injury was noted. The infra-umbilical port site fascia was closed with interrumpted 0 Vicryl sutures using PMI/cone under direct visualization. Pneumoperitoneum was released and ports removed.  4-0 subcuticular Monocryl was used to close the skin. Dermabond was  applied.  The patient was then extubated and brought to the recovery room in stable condition. Sponge, lap, and needle counts were correct at closure and at the conclusion of the case.               Ronny Bacon, M.D., [facility] 04/16/2021 12:18 PM

## 2021-04-16 NOTE — Interval H&P Note (Signed)
History and Physical Interval Note:  04/16/2021 10:37 AM  Kathy [name]  has presented today for surgery, with the diagnosis of chronic calculous cholecystitis.  The various methods of treatment have been discussed with the patient and family. After consideration of risks, benefits and other options for treatment, the patient has consented to  Procedure(s): XI ROBOTIC ASSISTED LAPAROSCOPIC CHOLECYSTECTOMY (N/A) [place] (ICG) (N/A) as a surgical intervention.  The patient's history has been reviewed, patient examined, no change in status, stable for surgery.  I have reviewed the patient's chart and labs.  Questions were answered to the patient's satisfaction.     Ronny Bacon

## 2021-04-17 LAB — SURGICAL PATHOLOGY

## 2021-04-22 ENCOUNTER — Ambulatory Visit: Payer: PPO

## 2021-04-24 DIAGNOSIS — H1131 Conjunctival hemorrhage, right eye: Secondary | ICD-10-CM | POA: Diagnosis not present

## 2021-05-01 ENCOUNTER — Telehealth: Payer: Self-pay

## 2021-05-01 ENCOUNTER — Encounter: Payer: PPO | Admitting: Surgery

## 2021-05-01 NOTE — Telephone Encounter (Signed)
LMTCB
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
## 2021-05-02 NOTE — Telephone Encounter (Signed)
Pt is scheduled for 05/08/2021. ?

## 2021-05-05 ENCOUNTER — Ambulatory Visit: Payer: PPO

## 2021-05-05 ENCOUNTER — Ambulatory Visit: Payer: PPO | Admitting: Internal Medicine

## 2021-05-05 ENCOUNTER — Telehealth: Payer: Self-pay

## 2021-05-05 NOTE — Telephone Encounter (Signed)
No answer when called to complete scheduled AWV. Reschedule as appropriate.  ?

## 2021-05-08 ENCOUNTER — Ambulatory Visit (INDEPENDENT_AMBULATORY_CARE_PROVIDER_SITE_OTHER): Payer: PPO | Admitting: Internal Medicine

## 2021-05-08 ENCOUNTER — Encounter: Payer: Self-pay | Admitting: Internal Medicine

## 2021-05-08 ENCOUNTER — Other Ambulatory Visit: Payer: Self-pay

## 2021-05-08 VITALS — BP 90/62 | HR 98 | Temp 98.2°F | Ht 61.5 in | Wt 95.8 lb

## 2021-05-08 DIAGNOSIS — D510 Vitamin B12 deficiency anemia due to intrinsic factor deficiency: Secondary | ICD-10-CM

## 2021-05-08 DIAGNOSIS — F1010 Alcohol abuse, uncomplicated: Secondary | ICD-10-CM | POA: Diagnosis not present

## 2021-05-08 DIAGNOSIS — K7010 Alcoholic hepatitis without ascites: Secondary | ICD-10-CM

## 2021-05-08 DIAGNOSIS — Z9049 Acquired absence of other specified parts of digestive tract: Secondary | ICD-10-CM | POA: Insufficient documentation

## 2021-05-08 DIAGNOSIS — E876 Hypokalemia: Secondary | ICD-10-CM | POA: Diagnosis not present

## 2021-05-08 DIAGNOSIS — E538 Deficiency of other specified B group vitamins: Secondary | ICD-10-CM

## 2021-05-08 DIAGNOSIS — F1024 Alcohol dependence with alcohol-induced mood disorder: Secondary | ICD-10-CM | POA: Diagnosis not present

## 2021-05-08 DIAGNOSIS — E441 Mild protein-calorie malnutrition: Secondary | ICD-10-CM | POA: Diagnosis not present

## 2021-05-08 LAB — COMPREHENSIVE METABOLIC PANEL
ALT: 55 U/L — ABNORMAL HIGH (ref 0–35)
AST: 204 U/L — ABNORMAL HIGH (ref 0–37)
Albumin: 3.9 g/dL (ref 3.5–5.2)
Alkaline Phosphatase: 263 U/L — ABNORMAL HIGH (ref 39–117)
BUN: 5 mg/dL — ABNORMAL LOW (ref 6–23)
CO2: 26 mEq/L (ref 19–32)
Calcium: 8 mg/dL — ABNORMAL LOW (ref 8.4–10.5)
Chloride: 91 mEq/L — ABNORMAL LOW (ref 96–112)
Creatinine, Ser: 0.6 mg/dL (ref 0.40–1.20)
GFR: 98.29 mL/min (ref >60.00)
Glucose, Bld: 104 mg/dL — ABNORMAL HIGH (ref 70–99)
Potassium: 2.9 mEq/L — ABNORMAL LOW (ref 3.5–5.1)
Sodium: 131 mEq/L — ABNORMAL LOW (ref 135–145)
Total Bilirubin: 1.5 mg/dL — ABNORMAL HIGH (ref 0.2–1.2)
Total Protein: 7.2 g/dL (ref 6.0–8.3)

## 2021-05-08 LAB — CBC WITH DIFFERENTIAL/PLATELET
Basophils Absolute: 0.1 10*3/uL (ref 0.0–0.1)
Basophils Relative: 1.2 % (ref 0.0–3.0)
Eosinophils Absolute: 0 10*3/uL (ref 0.0–0.7)
Eosinophils Relative: 0.7 % (ref 0.0–5.0)
HCT: 40.5 % (ref 36.0–46.0)
Hemoglobin: 13.7 g/dL (ref 12.0–15.0)
Lymphocytes Relative: 49.4 % — ABNORMAL HIGH (ref 12.0–46.0)
Lymphs Abs: 2.4 10*3/uL (ref 0.7–4.0)
MCHC: 33.9 g/dL (ref 30.0–36.0)
MCV: 108.6 fl — ABNORMAL HIGH (ref 78.0–100.0)
Monocytes Absolute: 0.4 10*3/uL (ref 0.1–1.0)
Monocytes Relative: 8.3 % (ref 3.0–12.0)
Neutro Abs: 1.9 10*3/uL (ref 1.4–7.7)
Neutrophils Relative %: 40.4 % — ABNORMAL LOW (ref 43.0–77.0)
Platelets: 178 10*3/uL (ref 150.0–400.0)
RBC: 3.73 Mil/uL — ABNORMAL LOW (ref 3.87–5.11)
RDW: 14.8 % (ref 11.5–15.5)
WBC: 4.8 10*3/uL (ref 4.0–10.5)

## 2021-05-08 LAB — MAGNESIUM: Magnesium: 1.2 mg/dL — ABNORMAL LOW (ref 1.5–2.5)

## 2021-05-08 MED ORDER — CYANOCOBALAMIN 1000 MCG/ML IJ SOLN
1000.0000 ug | Freq: Once | INTRAMUSCULAR | Status: AC
  Administered 2021-05-08: 1000 ug via INTRAMUSCULAR

## 2021-05-08 MED ORDER — MIRTAZAPINE 7.5 MG PO TABS: 7.5000 mg | ORAL_TABLET | Freq: Every day | ORAL | 1 refills | Status: DC

## 2021-05-08 NOTE — Progress Notes (Signed)
? ?Subjective:  ?Patient ID: Kathy [name], female    DOB: 07-20-1960  Age: 61 y.o. MRN: [id number] ? ?CC: The primary encounter diagnosis was Hypokalemia. Diagnoses of Pernicious anemia, Alcohol abuse, B12 deficiency, Alcohol dependence with alcohol-induced mood disorder ([place]), Mild malnutrition ([place]), Alcoholic hepatitis without ascites, and S/P laparoscopic cholecystectomy were also pertinent to this visit. ? ? ?This visit occurred during the SARS-CoV-2 public health emergency.  Safety protocols were in place, including screening questions prior to the visit, additional usage of staff PPE, and extensive cleaning of exam room while observing appropriate contact time as indicated for disinfecting solutions.   ? ?HPI ?Kathy [name] presents for   follow up on alcohol abuse.  Accompanied by one of her daughters. .  Patient is s/p  ?Cc:  lethargy,  somnolence and dizziness for several weeks.  History of alcohol relapse,  her 6th in the past year.  ?Has been abusing alcohol for the past year per daughter .  Last use was 3 weeks ago.  ? ?Taking omeprazole,  amlodipine.  Has not used tranxene daily ; only 3 of 30 tablets used from last prescription over 6 months  ? ?S/p gallbladder  surgery 2 weeks ago.  Has lost ten lbs involuntarily    no weight gain since the surgery  appetite is diminished,  but gets full very quickly.  Bowel movements are solid and occurring daily  ? ?Appetite is limted,  eating only twice daily    .  Wants to eat but few few things sound good.  Gets gagged putting in new teeth .  Has lost ten lbs over the last year.  ? ?Recent labs from hospitalization reviewed.  Low potassium  low magnesium .  Getting b12 injections,  due today  ?Daughter Kathy [name]     [place] [facility] [phone] ) ? ? ? ? ? ?Outpatient Medications Prior to Visit  ?Medication Sig Dispense Refill  ? clorazepate (TRANXENE-T) 7.5 MG tablet Take 1 tablet (7.5 mg total) by mouth at bedtime as needed  for anxiety. 30 tablet 0  ? cyanocobalamin (,VITAMIN B-12,) 1000 MCG/ML injection Inject 1 mL (1,000 mcg total) into the muscle every 14 (fourteen) days. 6 mL 3  ? denosumab (PROLIA) 60 MG/ML SOSY injection Inject 60 mg into the skin every 6 (six) months.    ? omeprazole (PRILOSEC) 40 MG capsule Take 1 capsule (40 mg total) by mouth 2 (two) times daily. 180 capsule 1  ? potassium chloride SA (KLOR-CON M) 20 MEQ tablet Take 1 tablet (20 mEq total) by mouth 2 (two) times daily. 10 tablet 0  ? Syringe/Needle, Disp, (SYRINGE 3CC/25GX1") 25G X 1" 3 ML MISC Use for b12 injections 50 each 0  ? amLODipine (NORVASC) 5 MG tablet TAKE 1 TABLET BY MOUTH TWICE DAILY 180 tablet 1  ? HYDROcodone-acetaminophen (NORCO/VICODIN) 5-325 MG tablet Take 1 tablet by mouth every 6 (six) hours as needed for moderate pain. (Patient not taking: Reported on 05/08/2021) 15 tablet 0  ? acetaminophen (TYLENOL) 500 MG tablet Take 500 mg by mouth every 6 (six) hours as needed.    ? B Complex-C-Folic Acid (B COMPLEX-VITAMIN C-FOLIC ACID) 1 MG tablet Take 1 tablet by mouth daily with breakfast. (Patient not taking: Reported on 04/09/2021) 90 tablet 3  ? ibuprofen (ADVIL) 800 MG tablet Take 1 tablet (800 mg total) by mouth every 8 (eight) hours as needed. 30 tablet 0  ? ?No facility-administered medications prior to  visit.  ? ? ?Review of Systems; ? ?Patient denies headache, fevers, malaise, unintentional weight loss, skin rash, eye pain, sinus congestion and sinus pain, sore throat, dysphagia,  hemoptysis , cough, dyspnea, wheezing, chest pain, palpitations, orthopnea, edema, abdominal pain, nausea, melena, diarrhea, constipation, flank pain, dysuria, hematuria, urinary  Frequency, nocturia, numbness, tingling, seizures,  Focal weakness, Loss of consciousness,  Tremor, insomnia, depression, anxiety, and suicidal ideation.   ? ? ? ?Objective:  ?BP 90/62 (BP Location: Left Arm, Patient Position: Sitting, Cuff Size: Normal)   Pulse 98   Temp 98.2 ?F  (36.8 ?C) (Oral)   Ht 5' 1.5" (1.562 m)   Wt 95 lb 12.8 oz (43.5 kg)   SpO2 97%   BMI 17.81 kg/m?  ? ?BP Readings from Last 3 Encounters:  ?05/08/21 90/62  ?04/16/21 101/70  ?04/01/21 (!) 145/97  ? ? ?Wt Readings from Last 3 Encounters:  ?05/08/21 95 lb 12.8 oz (43.5 kg)  ?04/16/21 100 lb (45.4 kg)  ?04/09/21 100 lb (45.4 kg)  ? ? ?General appearance: alert, cooperative and appears stated age ?Ears: normal TM's and external ear canals both ears ?Throat: lips, mucosa, and tongue normal; teeth and gums normal ?Neck: no adenopathy, no carotid bruit, supple, symmetrical, trachea midline and thyroid not enlarged, symmetric, no tenderness/mass/nodules ?Back: symmetric, no curvature. ROM normal. No CVA tenderness. ?Lungs: clear to auscultation bilaterally ?Heart: regular rate and rhythm, S1, S2 normal, no murmur, click, rub or gallop ?Abdomen: soft, non-tender; bowel sounds normal; no masses,  no organomegaly ?Pulses: 2+ and symmetric ?Skin: Skin color, texture, turgor normal. No rashes or lesions ?Lymph nodes: Cervical, supraclavicular, and axillary nodes normal. ? ?Lab Results  ?Component Value Date  ? HGBA1C 5.4 10/03/2018  ? HGBA1C 5.9 02/21/2018  ? HGBA1C 6.1 04/05/2017  ? ? ?Lab Results  ?Component Value Date  ? CREATININE 0.60 05/08/2021  ? CREATININE 0.70 04/16/2021  ? CREATININE 0.72 04/14/2021  ? ? ?Lab Results  ?Component Value Date  ? WBC 4.8 05/08/2021  ? HGB 13.7 05/08/2021  ? HCT 40.5 05/08/2021  ? PLT 178.0 05/08/2021  ? GLUCOSE 104 (H) 05/08/2021  ? CHOL 187 10/03/2018  ? TRIG 86 10/03/2018  ? HDL 95 10/03/2018  ? LDLDIRECT 119.0 10/11/2014  ? [place] 75 10/03/2018  ? ALT 55 (H) 05/08/2021  ? AST 204 (H) 05/08/2021  ? NA 131 (L) 05/08/2021  ? K 2.9 (L) 05/08/2021  ? CL 91 (L) 05/08/2021  ? CREATININE 0.60 05/08/2021  ? BUN 5 (L) 05/08/2021  ? CO2 26 05/08/2021  ? TSH 1.290 08/11/2018  ? HGBA1C 5.4 10/03/2018  ? ? ?No results found. ? ?Assessment & Plan:  ? ?Problem List Items Addressed This Visit   ? ?  Alcohol dependence with alcohol-induced mood disorder ([place])  ?  Strongly advised to return to [place] on a daily basis since she refuses inpatient rehab . Tranxene will not be refilled given her recurrent lapses  ?  ?  ? Pernicious anemia  ? Relevant Orders  ? CBC with Differential/Platelet (Completed)  ? Mild malnutrition ([place])  ?  Adding mirtazapine for appetite suppression  ?  ?  ? Alcoholic hepatitis without ascites  ?  Enzymes remain elevated  More than anticipated for 3 weeks of sobriety.  Blood alcohol level is pending  ? ?Lab Results  ?Component Value Date  ? ALT 55 (H) 05/08/2021  ? AST 204 (H) 05/08/2021  ? ALKPHOS 263 (H) 05/08/2021  ? BILITOT 1.5 (H) 05/08/2021  ? ? ?  ?  ?  S/P laparoscopic cholecystectomy  ?  For chronic cholecystitis Apr 16 2021.  ?  ?  ? ?Other Visit Diagnoses   ? ? Hypokalemia    -  Primary  ? Relevant Orders  ? Comprehensive metabolic panel (Completed)  ? Magnesium (Completed)  ? Alcohol abuse      ? Relevant Orders  ? Ethanol  ? B12 deficiency      ? Relevant Medications  ? cyanocobalamin ((VITAMIN B-12)) injection 1,000 mcg (Completed)  ? ?  ? ? ?I spent 30 minutes dedicated to the care of this patient on the date of this encounter to include pre-visit review of patient's medical history,  most recent imaging studies, Face-to-face time with the patient , and post visit ordering of testing and therapeutics.   ? ?Follow-up: Return in about 2 weeks (around 05/22/2021). ? ? ?Crecencio Mc, MD ?

## 2021-05-08 NOTE — Assessment & Plan Note (Addendum)
Enzymes remain elevated  More than anticipated for 3 weeks of sobriety.  Blood alcohol level is pending  ? ?Lab Results  ?Component Value Date  ? ALT 55 (H) 05/08/2021  ? AST 204 (H) 05/08/2021  ? ALKPHOS 263 (H) 05/08/2021  ? BILITOT 1.5 (H) 05/08/2021  ? ? ?

## 2021-05-08 NOTE — Assessment & Plan Note (Signed)
For chronic cholecystitis Apr 16 2021.  ?

## 2021-05-08 NOTE — Assessment & Plan Note (Addendum)
Adding mirtazapine for appetite suppression  ?

## 2021-05-08 NOTE — Assessment & Plan Note (Addendum)
Strongly advised to return to [facility] on a daily basis since she refuses inpatient rehab . Tranxene will not be refilled given her recurrent lapses  ?

## 2021-05-08 NOTE — Patient Instructions (Addendum)
Stop the amlodipine because your blood pressure is too LOW   ? ?Increase the omeprazole to 40 mg two times daily .  Take on empty stomach) ? ?I AM Adding mirtazapine at bedtime for appetite. Increase THE dose from 7.5 mg initially to 15 mg after 2 weeks  ? ?You need to attend AA meetings DAILY  ? ?Follow up with me in 2 weeks  ? ?

## 2021-05-11 NOTE — Addendum Note (Signed)
Addended by: Crecencio Mc on: 05/11/2021 12:43 AM ? ? Modules accepted: Orders ? ?

## 2021-05-12 ENCOUNTER — Encounter: Payer: PPO | Admitting: Internal Medicine

## 2021-05-13 ENCOUNTER — Telehealth: Payer: Self-pay | Admitting: Internal Medicine

## 2021-05-13 LAB — ETHANOL: Ethanol: 0.232 %

## 2021-05-13 MED ORDER — MAGNESIUM OXIDE 400 MG PO CAPS: 1.0000 | ORAL_CAPSULE | Freq: Two times a day (BID) | ORAL | 0 refills | Status: DC

## 2021-05-13 MED ORDER — POTASSIUM CHLORIDE CRYS ER 20 MEQ PO TBCR: 20.0000 meq | EXTENDED_RELEASE_TABLET | Freq: Three times a day (TID) | ORAL | 0 refills | Status: DC

## 2021-05-13 NOTE — Telephone Encounter (Signed)
Patient called, she can not get into her MyChart. Message was read from Dr Derrel Nip about her labs. She understood and will call back to make a lab appointment.  ?

## 2021-05-13 NOTE — Telephone Encounter (Signed)
Noted and documented in result note message ?

## 2021-05-15 ENCOUNTER — Emergency Department
Admission: EM | Admit: 2021-05-15 | Discharge: 2021-05-15 | Disposition: A | Discharge status: Home / Self Care | Payer: PPO | Attending: Emergency Medicine | Admitting: Emergency Medicine

## 2021-05-15 ENCOUNTER — Encounter: Payer: Self-pay | Admitting: Emergency Medicine

## 2021-05-15 ENCOUNTER — Emergency Department: Payer: PPO

## 2021-05-15 ENCOUNTER — Other Ambulatory Visit: Payer: Self-pay

## 2021-05-15 DIAGNOSIS — R7303 Prediabetes: Secondary | ICD-10-CM | POA: Diagnosis not present

## 2021-05-15 DIAGNOSIS — M546 Pain in thoracic spine: Secondary | ICD-10-CM | POA: Diagnosis not present

## 2021-05-15 DIAGNOSIS — F1010 Alcohol abuse, uncomplicated: Secondary | ICD-10-CM | POA: Diagnosis not present

## 2021-05-15 DIAGNOSIS — F1011 Alcohol abuse, in remission: Secondary | ICD-10-CM | POA: Insufficient documentation

## 2021-05-15 DIAGNOSIS — Y906 Blood alcohol level of 120-199 mg/100 ml: Secondary | ICD-10-CM | POA: Insufficient documentation

## 2021-05-15 DIAGNOSIS — R112 Nausea with vomiting, unspecified: Secondary | ICD-10-CM | POA: Diagnosis not present

## 2021-05-15 DIAGNOSIS — K449 Diaphragmatic hernia without obstruction or gangrene: Secondary | ICD-10-CM | POA: Insufficient documentation

## 2021-05-15 DIAGNOSIS — R634 Abnormal weight loss: Secondary | ICD-10-CM | POA: Diagnosis not present

## 2021-05-15 DIAGNOSIS — I10 Essential (primary) hypertension: Secondary | ICD-10-CM | POA: Insufficient documentation

## 2021-05-15 DIAGNOSIS — R9431 Abnormal electrocardiogram [ECG] [EKG]: Secondary | ICD-10-CM | POA: Diagnosis not present

## 2021-05-15 DIAGNOSIS — F329 Major depressive disorder, single episode, unspecified: Secondary | ICD-10-CM | POA: Insufficient documentation

## 2021-05-15 DIAGNOSIS — M25519 Pain in unspecified shoulder: Secondary | ICD-10-CM | POA: Diagnosis not present

## 2021-05-15 DIAGNOSIS — F411 Generalized anxiety disorder: Secondary | ICD-10-CM | POA: Diagnosis not present

## 2021-05-15 DIAGNOSIS — M545 Low back pain, unspecified: Secondary | ICD-10-CM | POA: Diagnosis not present

## 2021-05-15 DIAGNOSIS — R300 Dysuria: Secondary | ICD-10-CM | POA: Diagnosis not present

## 2021-05-15 DIAGNOSIS — M810 Age-related osteoporosis without current pathological fracture: Secondary | ICD-10-CM | POA: Diagnosis not present

## 2021-05-15 DIAGNOSIS — K7581 Nonalcoholic steatohepatitis (NASH): Secondary | ICD-10-CM | POA: Diagnosis not present

## 2021-05-15 DIAGNOSIS — E860 Dehydration: Secondary | ICD-10-CM | POA: Diagnosis not present

## 2021-05-15 DIAGNOSIS — R52 Pain, unspecified: Secondary | ICD-10-CM | POA: Diagnosis not present

## 2021-05-15 DIAGNOSIS — R197 Diarrhea, unspecified: Secondary | ICD-10-CM | POA: Diagnosis not present

## 2021-05-15 DIAGNOSIS — R1013 Epigastric pain: Secondary | ICD-10-CM | POA: Diagnosis not present

## 2021-05-15 DIAGNOSIS — K5730 Diverticulosis of large intestine without perforation or abscess without bleeding: Secondary | ICD-10-CM | POA: Diagnosis not present

## 2021-05-15 DIAGNOSIS — N281 Cyst of kidney, acquired: Secondary | ICD-10-CM | POA: Insufficient documentation

## 2021-05-15 DIAGNOSIS — R1084 Generalized abdominal pain: Secondary | ICD-10-CM | POA: Diagnosis not present

## 2021-05-15 DIAGNOSIS — R0789 Other chest pain: Secondary | ICD-10-CM | POA: Diagnosis not present

## 2021-05-15 LAB — URINALYSIS, ROUTINE W REFLEX MICROSCOPIC
Bilirubin Urine: NEGATIVE
Glucose, UA: NEGATIVE mg/dL
Ketones, ur: 20 mg/dL — AB
Nitrite: POSITIVE — AB
Protein, ur: 30 mg/dL — AB
Specific Gravity, Urine: 1.016 (ref 1.005–1.030)
pH: 5 (ref 5.0–8.0)

## 2021-05-15 LAB — COMPREHENSIVE METABOLIC PANEL
ALT: 41 U/L (ref 0–44)
AST: 135 U/L — ABNORMAL HIGH (ref 15–41)
Albumin: 3.7 g/dL (ref 3.5–5.0)
Alkaline Phosphatase: 181 U/L — ABNORMAL HIGH (ref 38–126)
Anion gap: 18 — ABNORMAL HIGH (ref 5–15)
BUN: 7 mg/dL (ref 6–20)
CO2: 22 mmol/L (ref 22–32)
Calcium: 7.7 mg/dL — ABNORMAL LOW (ref 8.9–10.3)
Chloride: 95 mmol/L — ABNORMAL LOW (ref 98–111)
Creatinine, Ser: 0.68 mg/dL (ref 0.44–1.00)
GFR, Estimated: >60 mL/min (ref >60)
Glucose, Bld: 111 mg/dL — ABNORMAL HIGH (ref 70–99)
Potassium: 3.5 mmol/L (ref 3.5–5.1)
Sodium: 135 mmol/L (ref 135–145)
Total Bilirubin: 1.4 mg/dL — ABNORMAL HIGH (ref 0.3–1.2)
Total Protein: 7.7 g/dL (ref 6.5–8.1)

## 2021-05-15 LAB — BRAIN NATRIURETIC PEPTIDE: B Natriuretic Peptide: 13.7 pg/mL (ref 0.0–100.0)

## 2021-05-15 LAB — CBC WITH DIFFERENTIAL/PLATELET
Abs Immature Granulocytes: 0.02 10*3/uL (ref 0.00–0.07)
Basophils Absolute: 0 10*3/uL (ref 0.0–0.1)
Basophils Relative: 1 %
Eosinophils Absolute: 0 10*3/uL (ref 0.0–0.5)
Eosinophils Relative: 1 %
HCT: 42.2 % (ref 36.0–46.0)
Hemoglobin: 14.5 g/dL (ref 12.0–15.0)
Immature Granulocytes: 0 %
Lymphocytes Relative: 45 %
Lymphs Abs: 2 10*3/uL (ref 0.7–4.0)
MCH: 35.3 pg — ABNORMAL HIGH (ref 26.0–34.0)
MCHC: 34.4 g/dL (ref 30.0–36.0)
MCV: 102.7 fL — ABNORMAL HIGH (ref 80.0–100.0)
Monocytes Absolute: 0.4 10*3/uL (ref 0.1–1.0)
Monocytes Relative: 9 %
Neutro Abs: 2 10*3/uL (ref 1.7–7.7)
Neutrophils Relative %: 44 %
Platelets: 139 10*3/uL — ABNORMAL LOW (ref 150–400)
RBC: 4.11 MIL/uL (ref 3.87–5.11)
RDW: 13.4 % (ref 11.5–15.5)
WBC: 4.6 10*3/uL (ref 4.0–10.5)
nRBC: 0 % (ref 0.0–0.2)

## 2021-05-15 LAB — TROPONIN I (HIGH SENSITIVITY)
Troponin I (High Sensitivity): 9 ng/L (ref <18)
Troponin I (High Sensitivity): 10 ng/L (ref <18)

## 2021-05-15 LAB — ETHANOL: Alcohol, Ethyl (B): 168 mg/dL — ABNORMAL HIGH (ref <10)

## 2021-05-15 LAB — LACTIC ACID, PLASMA
Lactic Acid, Venous: 2.8 mmol/L (ref 0.5–1.9)
Lactic Acid, Venous: 3.1 mmol/L (ref 0.5–1.9)

## 2021-05-15 LAB — LIPASE, BLOOD: Lipase: 32 U/L (ref 11–51)

## 2021-05-15 LAB — D-DIMER, QUANTITATIVE: D-Dimer, Quant: 2.16 ug/mL-FEU — ABNORMAL HIGH (ref 0.00–0.50)

## 2021-05-15 MED ORDER — ONDANSETRON 8 MG PO TBDP: 8.0000 mg | ORAL_TABLET | Freq: Three times a day (TID) | ORAL | 0 refills | Status: DC | PRN

## 2021-05-15 MED ORDER — IOHEXOL 300 MG/ML  SOLN
80.0000 mL | Freq: Once | INTRAMUSCULAR | Status: AC | PRN
  Administered 2021-05-15: 80 mL via INTRAVENOUS

## 2021-05-15 MED ORDER — SODIUM CHLORIDE 0.9 % IV BOLUS
1000.0000 mL | Freq: Once | INTRAVENOUS | Status: AC
  Administered 2021-05-15: 1000 mL via INTRAVENOUS

## 2021-05-15 MED ORDER — ONDANSETRON HCL 4 MG/2ML IJ SOLN
4.0000 mg | Freq: Once | INTRAMUSCULAR | Status: AC
  Administered 2021-05-15: 4 mg via INTRAVENOUS

## 2021-05-15 MED ORDER — KETOROLAC TROMETHAMINE 30 MG/ML IJ SOLN
30.0000 mg | Freq: Once | INTRAMUSCULAR | Status: AC
  Administered 2021-05-15: 30 mg via INTRAVENOUS
  Filled 2021-05-15: qty 1

## 2021-05-15 MED ORDER — SODIUM CHLORIDE 0.9 % IV SOLN
1.0000 g | Freq: Once | INTRAVENOUS | Status: AC
  Administered 2021-05-15: 1 g via INTRAVENOUS
  Filled 2021-05-15: qty 10

## 2021-05-15 MED ORDER — CEFDINIR 300 MG PO CAPS: 300.0000 mg | ORAL_CAPSULE | Freq: Two times a day (BID) | ORAL | 0 refills | Status: AC

## 2021-05-15 MED ORDER — ONDANSETRON HCL 4 MG/2ML IJ SOLN
4.0000 mg | Freq: Once | INTRAMUSCULAR | Status: AC
  Administered 2021-05-15: 4 mg via INTRAVENOUS
  Filled 2021-05-15: qty 2

## 2021-05-15 NOTE — ED Triage Notes (Signed)
Pt bib ems with multiple complaints ?Upper back pain on and off for 1 year ?Lower abd pain- dry heaving ?Lost 10lbs in 1 month  ?Left sided chest pain on and off x 1 month ?Urinary discomfort with foul smelling urine ?Pt had gallbladder surgery 2 weeks ago and states the magnesium she is on is causing diarrhea so she is now dehydrated.  ?

## 2021-05-15 NOTE — ED Provider Notes (Signed)
? ?[facility] ?Provider Note ? ? Event Date/Time  ? First MD Initiated Contact with Patient 05/15/21 [phone]   ?  (approximate) ?History  ?Abdominal Pain and Back Pain ? ?HPI ?Kathy [name] is a 61 y.o. female with a stated past medical history of alcohol abuse, fatty liver disease, major depressive disorder, tobacco abuse, generalized anxiety disorder, hypertension, and prediabetes who presents via EMS with multiple complaints including nausea/vomiting, abdominal pain, parascapular pain, chest pain, and dysuria.  Patient states that this upper back/parascapular pain has been present over the last year and intermittent.  Patient describes the abdominal pain as coming from her nausea/vomiting episodes that she states she has at least daily.  Patient states that this is caused her to lose 10 pounds in the last month.  Patient also endorses left-sided off-and-on chest pain over the last month.  Patient also concerned as she had gallbladder surgery 2 weeks ago and has been on magnesium that has been causing diarrhea and she is concerned that she is becoming dehydrated. ?Physical Exam  ?Triage Vital Signs: ?ED Triage Vitals  ?Enc Vitals Group  ?   BP 05/15/21 0958 126/78  ?   Pulse Rate 05/15/21 0955 (!) 108  ?   Resp 05/15/21 0955 20  ?   Temp 05/15/21 0955 97.7 ?F (36.5 ?C)  ?   Temp Source 05/15/21 0955 Oral  ?   SpO2 --   ?   Weight --   ?   Height --   ?   Head Circumference --   ?   Peak Flow --   ?   Pain Score --   ?   Pain Loc --   ?   Pain Edu? --   ?   Excl. in [place]? --   ? ?Most recent vital signs: ?Vitals:  ? 05/15/21 1430 05/15/21 1506  ?BP: 122/80   ?Pulse: (!) 101   ?Resp: 20   ?Temp:  97.8 ?F (36.6 ?C)  ?SpO2: 97%   ? ?General: Awake, oriented x4. ?CV:  Good peripheral perfusion.  ?Resp:  Normal effort.  ?Abd:  No distention.  ?Other:  Middle-aged Caucasian female laying in bed in no distress ?ED Results / Procedures / Treatments  ?Labs ?(all labs ordered are listed, but only  abnormal results are displayed) ?Labs Reviewed  ?COMPREHENSIVE METABOLIC PANEL - Abnormal; Notable for the following components:  ?    Result Value  ? Chloride 95 (*)   ? Glucose, Bld 111 (*)   ? Calcium 7.7 (*)   ? AST 135 (*)   ? Alkaline Phosphatase 181 (*)   ? Total Bilirubin 1.4 (*)   ? Anion gap 18 (*)   ? All other components within normal limits  ?ETHANOL - Abnormal; Notable for the following components:  ? Alcohol, Ethyl (B) 168 (*)   ? All other components within normal limits  ?LACTIC ACID, PLASMA - Abnormal; Notable for the following components:  ? Lactic Acid, Venous 2.8 (*)   ? All other components within normal limits  ?LACTIC ACID, PLASMA - Abnormal; Notable for the following components:  ? Lactic Acid, Venous 3.1 (*)   ? All other components within normal limits  ?CBC WITH DIFFERENTIAL/PLATELET - Abnormal; Notable for the following components:  ? MCV 102.7 (*)   ? MCH 35.3 (*)   ? Platelets 139 (*)   ? All other components within normal limits  ?D-DIMER, QUANTITATIVE - Abnormal; Notable for the following components:  ?  D-Dimer, Quant 2.16 (*)   ? All other components within normal limits  ?URINALYSIS, ROUTINE W REFLEX MICROSCOPIC - Abnormal; Notable for the following components:  ? Color, Urine AMBER (*)   ? APPearance CLOUDY (*)   ? Hgb urine dipstick SMALL (*)   ? Ketones, ur 20 (*)   ? Protein, ur 30 (*)   ? Nitrite POSITIVE (*)   ? Leukocytes,Ua MODERATE (*)   ? Bacteria, UA MANY (*)   ? All other components within normal limits  ?CULTURE, BLOOD (ROUTINE X 2)  ?CULTURE, BLOOD (ROUTINE X 2)  ?BRAIN NATRIURETIC PEPTIDE  ?LIPASE, BLOOD  ?TROPONIN I (HIGH SENSITIVITY)  ?TROPONIN I (HIGH SENSITIVITY)  ? ?RADIOLOGY ?ED MD interpretation: CT of the chest/abdomen/pelvis shows moderate to large hiatal hernia with thickening of the distal esophagus concerning for esophagitis.  No evidence of acute abnormalities to explain patient's abdominal pain/vomiting ?-Agree with radiology assessment ?Official  radiology report(s): ?CT CHEST ABDOMEN PELVIS W CONTRAST ? ?Result Date: 05/15/2021 ?CLINICAL DATA:  Unintentional weight loss with nausea vomiting and chest pain. EXAM: CT CHEST, ABDOMEN, AND PELVIS WITH CONTRAST TECHNIQUE: Multidetector CT imaging of the chest, abdomen and pelvis was performed following the standard protocol during bolus administration of intravenous contrast. RADIATION DOSE REDUCTION: This exam was performed according to the departmental dose-optimization program which includes automated exposure control, adjustment of the mA and/or kV according to patient size and/or use of iterative reconstruction technique. CONTRAST:  33m OMNIPAQUE IOHEXOL 300 MG/ML  SOLN COMPARISON:  CT March 23, 2021. FINDINGS: CT CHEST FINDINGS Cardiovascular: Aortic and branch vessel atherosclerosis without thoracic aortic aneurysm. No central pulmonary embolus on this nondedicated study. Normal size heart. No significant pericardial effusion/thickening. Mediastinum/Nodes: No discrete thyroid nodule. No pathologically enlarged mediastinal, hilar or axillary lymph nodes. Moderate to large hiatal hernia with thickening of the distal esophagus. Lungs/Pleura: No suspicious pulmonary nodules or masses. No pleural effusion. No pneumothorax. No focal airspace consolidation. Musculoskeletal: No chest wall mass or suspicious bone lesions identified. CT ABDOMEN PELVIS FINDINGS Hepatobiliary: Severe diffuse hepatic steatosis. Gallbladder is surgically absent. Prominence of the extrahepatic biliary tree with the common duct measuring 7 mm unchanged from CT March 23, 2021. Pancreas: No pancreatic ductal dilation or evidence of acute inflammation. Spleen: No splenomegaly or focal splenic lesion. Adrenals/Urinary Tract: Bilateral adrenal glands appear normal. Large left renal cyst with similar severe left renal atrophy and minimal residual renal parenchyma. Right kidneys unremarkable. Urinary bladder is unremarkable for degree of  distension. Stomach/Bowel: No enteric contrast was administered. No pathologic dilation of small or large bowel. Mild diffuse wall thickening of a predominantly decompressed colon likely reflects underdistention. Sigmoid colonic diverticulosis without findings of acute diverticulitis. The appendix is not confidently identified however there is no pericecal inflammation. Vascular/Lymphatic: Aortic and branch vessel atherosclerosis without abdominal aortic aneurysm. No pathologically enlarged abdominal or pelvic lymph nodes. Reproductive: Uterus and bilateral adnexa are unremarkable. Other: No significant abdominopelvic free fluid. Musculoskeletal: Mild spondylosis. Diffuse demineralization of bone. No acute osseous abnormality. IMPRESSION: 1. Moderate to large hiatal hernia with thickening of the distal esophagus. Correlate for esophagitis. 2. Severe diffuse hepatic steatosis, which in the setting of steatohepatitis can be a cause of abdominal pain recommend correlation with LFTs. 3. Prominence of the extrahepatic biliary tree appears similar prior with the common duct measuring 7 mm. Recommend correlation with laboratory values to assess for biliary obstruction. 4. Sigmoid colonic diverticulosis without findings of acute diverticulitis. 5. Severe left renal atrophy with minimal residual renal parenchyma and a large  renal cyst. 6.  Aortic Atherosclerosis (ICD10-I70.0). Electronically Signed   By: Dahlia Bailiff M.D.   On: 05/15/2021 11:54   ?PROCEDURES: ?Critical Care performed: No ?.1-3 Lead EKG Interpretation ?Performed by: Naaman Plummer, MD ?Authorized by: Naaman Plummer, MD  ? ?  Interpretation: normal   ?  ECG rate:  98 ?  ECG rate assessment: normal   ?  Rhythm: sinus rhythm   ?  Ectopy: none   ?  Conduction: normal   ?MEDICATIONS ORDERED IN ED: ?Medications  ?ondansetron (ZOFRAN) injection 4 mg (4 mg Intravenous Given 05/15/21 1019)  ?ketorolac (TORADOL) 30 MG/ML injection 30 mg (30 mg Intravenous Given  05/15/21 1020)  ?sodium chloride 0.9 % bolus 1,000 mL (0 mLs Intravenous Stopped 05/15/21 1200)  ?iohexol (OMNIPAQUE) 300 MG/ML solution 80 mL (80 mLs Intravenous Contrast Given 05/15/21 1126)  ?cefTRIAXone (ROCEPHIN) 1

## 2021-05-15 NOTE — TOC Initial Note (Signed)
Transition of Care (TOC) - Initial/Assessment Note  ? ? ?Patient Details  ?Name: Kathy [name] ?MRN: [id number] ?Date of Birth: [birth year] ? ?Transition of Care (TOC) CM/SW Contact:    ?Shelbie Hutching, RN ?Phone Number: ?05/15/2021, 1:10 PM ? ?Clinical Narrative:                 ?Patient came into the emergency department with back pain, abd pain, nausea, and urinary symptoms.  Patient will likely be discharged home with oral antibiotics.  TOC consult received for substance abuse resources. Patient expressed interest in stopping alcohol.  Patient reports that she has stopped in the past and was alcohol free for 5 years then when [place] hit she started drinking again.  She reports that she has been self weaning at home and tapering her drinking.  She fully intends to quit.  She has been to rehab in the past at [facility], she does not really want to do inpatient treatment prefers outpatient treatment.  She reports that since she has been through rehab in the past she has the foundation and knowledge she just has to put it into practice.  She denies any complications from withdrawal in the past.   ? ?[place] substance abuse resources provided to patient. ? ?Patient lives alone and is independent.  PCP is Dr. Derrel Nip, she uses [facility] for prescriptions.  Patient will call family to pick her up from the emergency room.  ? ?  ?Barriers to Discharge: Barriers Resolved ? ? ?Patient Goals and CMS Choice ?Patient states their goals for this hospitalization and ongoing recovery are:: Patient ready to go home ?  ?  ? ?Expected Discharge Plan and Services ?  ?  ?  ?  ?  ?                ?  ?  ?  ?  ?  ?  ?  ?  ?  ?  ? ?Prior Living Arrangements/Services ?  ?  ?  ?       ?  ?  ?  ?  ? ?Activities of Daily Living ?  ?  ? ?Permission Sought/Granted ?  ?  ?   ?   ?   ?   ? ?Emotional Assessment ?  ?  ?  ?  ?  ?  ? ?Admission diagnosis:  N/V, EMS ?Patient Active Problem List  ? Diagnosis Date  Noted  ? S/P laparoscopic cholecystectomy 05/08/2021  ? 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 Abdominal aortic atherosclerosis ([place]) 12/21/2020  ? Elevated LFTs 10/13/2020  ? Thrombocytopenia ([place]) 10/13/2020  ? Alcoholic hepatitis without ascites 09/09/2020  ? Paraesophageal hiatal hernia 09/09/2020  ? Gallbladder mass 09/09/2020  ? Constipation 05/21/2020  ? Mild malnutrition ([place]) 05/20/2020  ? Mild episode of recurrent major depressive disorder ([place]) 05/20/2020  ? Essential hypertension 10/03/2019  ? Insomnia due to anxiety and fear 09/29/2019  ? GAD (generalized anxiety disorder) 09/19/2018  ? Poor dentition 05/18/2018  ? Tobacco abuse 02/22/2018  ? Atrophy of left kidney 02/22/2018  ? Diverticulosis of colon without diverticulitis 02/22/2018  ? MDD (major depressive disorder) 09/13/2017  ? Osteoporosis 10/17/2016  ? Prediabetes 10/17/2016  ? Pernicious anemia 01/01/2016  ? Hyponatremia 09/10/2015  ? [place] 07/31/2015  ? Alcoholic peripheral neuropathy ([place]) 04/23/2015  ? Fatty liver, alcoholic 40/97/3532  ? Alcohol dependence with alcohol-induced mood disorder ([place])   ? History of alcohol dependence ([place]) 07/13/2014  ? ?  PCP:  Crecencio Mc, MD ?Pharmacy:   ?[facility] - [place], [place] ?[address] ?[place] [address] ?Phone: [phone] Fax: [phone] ? ?[place], [place] ?[address] ?[place] [address] ?Phone: [phone] Fax: [phone] ? ?[place], [place] - [place] ?[address] ?[place] [address] ?Phone: [phone] Fax: [phone] ? ?[facility] [place], [place] [facility] ?[address] ?[place] [address] ?Phone: [phone] Fax: [phone] ? ?[id number] ?[address] ?[place]  [address] ?Phone: [phone] Fax: [phone] ? ? ? ? ?Social Determinants of Health (SDOH) Interventions ?  ? ?Readmission Risk Interventions ?   ? View : No data to display.  ?  ?  ?  ? ? ? ?

## 2021-05-15 NOTE — ED Notes (Signed)
Dc instructions and scripts reviewed with pt no questions or concerns at this time. Will follow up . To walked to lobby to wait for father to transport home.  ?

## 2021-05-15 NOTE — ED Notes (Signed)
Critical lab Lactic 2.8 Dr. Cheri Fowler made aware.  ?

## 2021-05-20 LAB — CULTURE, BLOOD (ROUTINE X 2)
Culture: NO GROWTH
Culture: NO GROWTH
Special Requests: ADEQUATE

## 2021-05-25 ENCOUNTER — Emergency Department
Admission: EM | Admit: 2021-05-25 | Discharge: 2021-05-25 | Disposition: A | Discharge status: Home / Self Care | Payer: PPO | Attending: Emergency Medicine | Admitting: Emergency Medicine

## 2021-05-25 ENCOUNTER — Other Ambulatory Visit: Payer: Self-pay

## 2021-05-25 ENCOUNTER — Emergency Department: Payer: PPO

## 2021-05-25 DIAGNOSIS — E161 Other hypoglycemia: Secondary | ICD-10-CM | POA: Diagnosis not present

## 2021-05-25 DIAGNOSIS — F411 Generalized anxiety disorder: Secondary | ICD-10-CM | POA: Diagnosis not present

## 2021-05-25 DIAGNOSIS — F1020 Alcohol dependence, uncomplicated: Secondary | ICD-10-CM | POA: Insufficient documentation

## 2021-05-25 DIAGNOSIS — E162 Hypoglycemia, unspecified: Secondary | ICD-10-CM | POA: Diagnosis not present

## 2021-05-25 DIAGNOSIS — E872 Acidosis, unspecified: Secondary | ICD-10-CM | POA: Diagnosis not present

## 2021-05-25 DIAGNOSIS — K760 Fatty (change of) liver, not elsewhere classified: Secondary | ICD-10-CM | POA: Diagnosis not present

## 2021-05-25 DIAGNOSIS — F1029 Alcohol dependence with unspecified alcohol-induced disorder: Secondary | ICD-10-CM | POA: Diagnosis not present

## 2021-05-25 DIAGNOSIS — I10 Essential (primary) hypertension: Secondary | ICD-10-CM | POA: Diagnosis not present

## 2021-05-25 DIAGNOSIS — F17200 Nicotine dependence, unspecified, uncomplicated: Secondary | ICD-10-CM | POA: Insufficient documentation

## 2021-05-25 DIAGNOSIS — K449 Diaphragmatic hernia without obstruction or gangrene: Secondary | ICD-10-CM | POA: Diagnosis not present

## 2021-05-25 DIAGNOSIS — R110 Nausea: Secondary | ICD-10-CM | POA: Diagnosis not present

## 2021-05-25 DIAGNOSIS — R531 Weakness: Secondary | ICD-10-CM | POA: Diagnosis not present

## 2021-05-25 DIAGNOSIS — R109 Unspecified abdominal pain: Secondary | ICD-10-CM | POA: Diagnosis not present

## 2021-05-25 DIAGNOSIS — F32A Depression, unspecified: Secondary | ICD-10-CM | POA: Diagnosis not present

## 2021-05-25 DIAGNOSIS — Y903 Blood alcohol level of 60-79 mg/100 ml: Secondary | ICD-10-CM | POA: Diagnosis not present

## 2021-05-25 DIAGNOSIS — F331 Major depressive disorder, recurrent, moderate: Secondary | ICD-10-CM | POA: Insufficient documentation

## 2021-05-25 DIAGNOSIS — R300 Dysuria: Secondary | ICD-10-CM | POA: Diagnosis not present

## 2021-05-25 DIAGNOSIS — R42 Dizziness and giddiness: Secondary | ICD-10-CM | POA: Diagnosis not present

## 2021-05-25 DIAGNOSIS — F1024 Alcohol dependence with alcohol-induced mood disorder: Secondary | ICD-10-CM | POA: Insufficient documentation

## 2021-05-25 LAB — COMPREHENSIVE METABOLIC PANEL
ALT: 41 U/L (ref 0–44)
AST: 114 U/L — ABNORMAL HIGH (ref 15–41)
Albumin: 3.1 g/dL — ABNORMAL LOW (ref 3.5–5.0)
Alkaline Phosphatase: 161 U/L — ABNORMAL HIGH (ref 38–126)
Anion gap: 22 — ABNORMAL HIGH (ref 5–15)
BUN: 8 mg/dL (ref 6–20)
CO2: 13 mmol/L — ABNORMAL LOW (ref 22–32)
Calcium: 7.3 mg/dL — ABNORMAL LOW (ref 8.9–10.3)
Chloride: 96 mmol/L — ABNORMAL LOW (ref 98–111)
Creatinine, Ser: 0.59 mg/dL (ref 0.44–1.00)
GFR, Estimated: >60 mL/min (ref >60)
Glucose, Bld: 121 mg/dL — ABNORMAL HIGH (ref 70–99)
Potassium: 3.8 mmol/L (ref 3.5–5.1)
Sodium: 131 mmol/L — ABNORMAL LOW (ref 135–145)
Total Bilirubin: 1.6 mg/dL — ABNORMAL HIGH (ref 0.3–1.2)
Total Protein: 6.5 g/dL (ref 6.5–8.1)

## 2021-05-25 LAB — URINALYSIS, ROUTINE W REFLEX MICROSCOPIC
Bilirubin Urine: NEGATIVE
Glucose, UA: NEGATIVE mg/dL
Ketones, ur: 80 mg/dL — AB
Nitrite: NEGATIVE
Protein, ur: 30 mg/dL — AB
Specific Gravity, Urine: 1.014 (ref 1.005–1.030)
pH: 5 (ref 5.0–8.0)

## 2021-05-25 LAB — CBC WITH DIFFERENTIAL/PLATELET
Abs Immature Granulocytes: 0.04 10*3/uL (ref 0.00–0.07)
Basophils Absolute: 0.1 10*3/uL (ref 0.0–0.1)
Basophils Relative: 1 %
Eosinophils Absolute: 0 10*3/uL (ref 0.0–0.5)
Eosinophils Relative: 0 %
HCT: 37.3 % (ref 36.0–46.0)
Hemoglobin: 12.4 g/dL (ref 12.0–15.0)
Immature Granulocytes: 1 %
Lymphocytes Relative: 20 %
Lymphs Abs: 0.9 10*3/uL (ref 0.7–4.0)
MCH: 35.4 pg — ABNORMAL HIGH (ref 26.0–34.0)
MCHC: 33.2 g/dL (ref 30.0–36.0)
MCV: 106.6 fL — ABNORMAL HIGH (ref 80.0–100.0)
Monocytes Absolute: 0.3 10*3/uL (ref 0.1–1.0)
Monocytes Relative: 7 %
Neutro Abs: 3.2 10*3/uL (ref 1.7–7.7)
Neutrophils Relative %: 71 %
Platelets: 195 10*3/uL (ref 150–400)
RBC: 3.5 MIL/uL — ABNORMAL LOW (ref 3.87–5.11)
RDW: 14.4 % (ref 11.5–15.5)
WBC: 4.5 10*3/uL (ref 4.0–10.5)
nRBC: 0 % (ref 0.0–0.2)

## 2021-05-25 LAB — URINE DRUG SCREEN, QUALITATIVE (ARMC ONLY)
Amphetamines, Ur Screen: NOT DETECTED
Barbiturates, Ur Screen: NOT DETECTED
Benzodiazepine, Ur Scrn: POSITIVE — AB
Cannabinoid 50 Ng, Ur ~~LOC~~: NOT DETECTED
Cocaine Metabolite,Ur ~~LOC~~: NOT DETECTED
MDMA (Ecstasy)Ur Screen: NOT DETECTED
Methadone Scn, Ur: NOT DETECTED
Opiate, Ur Screen: NOT DETECTED
Phencyclidine (PCP) Ur S: NOT DETECTED
Tricyclic, Ur Screen: NOT DETECTED

## 2021-05-25 LAB — BASIC METABOLIC PANEL
Anion gap: 12 (ref 5–15)
BUN: 6 mg/dL (ref 6–20)
CO2: 19 mmol/L — ABNORMAL LOW (ref 22–32)
Calcium: 6.7 mg/dL — ABNORMAL LOW (ref 8.9–10.3)
Chloride: 91 mmol/L — ABNORMAL LOW (ref 98–111)
Creatinine, Ser: 0.6 mg/dL (ref 0.44–1.00)
GFR, Estimated: >60 mL/min (ref >60)
Glucose, Bld: 297 mg/dL — ABNORMAL HIGH (ref 70–99)
Potassium: 3.8 mmol/L (ref 3.5–5.1)
Sodium: 122 mmol/L — ABNORMAL LOW (ref 135–145)

## 2021-05-25 LAB — LIPASE, BLOOD: Lipase: 26 U/L (ref 11–51)

## 2021-05-25 LAB — LACTIC ACID, PLASMA
Lactic Acid, Venous: 5.2 mmol/L (ref 0.5–1.9)
Lactic Acid, Venous: 2.6 mmol/L (ref 0.5–1.9)

## 2021-05-25 LAB — ETHANOL: Alcohol, Ethyl (B): 76 mg/dL — ABNORMAL HIGH (ref <10)

## 2021-05-25 MED ORDER — DEXTROSE 5 % AND 0.9 % NACL IV BOLUS
1000.0000 mL | Freq: Once | INTRAVENOUS | Status: AC
  Administered 2021-05-25: 1000 mL via INTRAVENOUS
  Filled 2021-05-25: qty 1000

## 2021-05-25 MED ORDER — ONDANSETRON HCL 4 MG/2ML IJ SOLN
4.0000 mg | Freq: Once | INTRAMUSCULAR | Status: AC
  Administered 2021-05-25: 4 mg via INTRAVENOUS
  Filled 2021-05-25: qty 2

## 2021-05-25 MED ORDER — THIAMINE HCL 100 MG/ML IJ SOLN
100.0000 mg | Freq: Once | INTRAMUSCULAR | Status: AC
  Administered 2021-05-25: 100 mg via INTRAVENOUS
  Filled 2021-05-25: qty 2

## 2021-05-25 MED ORDER — SODIUM CHLORIDE 0.9 % IV BOLUS
1000.0000 mL | Freq: Once | INTRAVENOUS | Status: AC
  Administered 2021-05-25: 1000 mL via INTRAVENOUS

## 2021-05-25 MED ORDER — METOCLOPRAMIDE HCL 5 MG/ML IJ SOLN
10.0000 mg | Freq: Once | INTRAMUSCULAR | Status: AC
  Administered 2021-05-25: 10 mg via INTRAVENOUS
  Filled 2021-05-25: qty 2

## 2021-05-25 MED ORDER — IOHEXOL 350 MG/ML SOLN
75.0000 mL | Freq: Once | INTRAVENOUS | Status: AC | PRN
  Administered 2021-05-25: 75 mL via INTRAVENOUS

## 2021-05-25 MED ORDER — METOCLOPRAMIDE HCL 10 MG PO TABS: 10.0000 mg | ORAL_TABLET | Freq: Three times a day (TID) | ORAL | 0 refills | Status: DC

## 2021-05-25 NOTE — ED Notes (Signed)
Provided warm blankets and emesis bag. ?

## 2021-05-25 NOTE — ED Notes (Signed)
Pt adamently stating that she wants to discharge home and that her ride needs to come now and she does not want to wait for lab results. States she "feels much better" after nausea meds and IVF. Provider was at bedside and recommended waiting to stay for repeat lab results. Pt states "Arnetha Gula can just call me if I'm dying and I'll come back" (if labs are abnormal). Provider is typing up discharge paperwork now. ?

## 2021-05-25 NOTE — ED Notes (Signed)
Per provider, draw BMP and lactic after 2L bolus complete. ?

## 2021-05-25 NOTE — ED Notes (Signed)
Pt vomited. Zofran given . Shirt changed. Emesis appears yellow in color. ?

## 2021-05-25 NOTE — ED Notes (Signed)
Lactic is 5.2, called by lab, EDP notified. ?

## 2021-05-25 NOTE — ED Provider Notes (Signed)
? ? ?[facility] ?Emergency Department Provider Note ? ? ? ? Event Date/Time  ? First MD Initiated Contact with Patient 05/25/21 1141   ?  (approximate) ? ? ?History  ? ?Nausea and Emesis ? ? ?HPI ? ?Kathy [name] is a 61 y.o. female with a stated past medical history of alcohol abuse, fatty liver disease, major depressive disorder, tobacco abuse, generalized anxiety disorder, hypertension, and prediabetes who presents via EMS with multiple complaints including lightheadedness, nausea/vomiting, LUQ abdominal pain, and dysuria 3 days.  Patient is 4 weeks status post laparoscopic cholecystectomy.  Patient presents via EMS from home where she lives alone.  CBG upon arrival was 62.  She received 100 mL of deep hand in route, with a reported CBG of 122.  Has attempted to quit drinking, but reports having "a couple mixed drinks" yesterday.  Is motivated to stop drinking so that she can spend time with her grandkids for the Easter holiday.  Patient denies any cough, congestion, or fevers.  She also denies any hematemesis, bright red blood, or melanotic stools. ? ? ?Physical Exam  ? ?Triage Vital Signs: ?ED Triage Vitals [05/25/21 1146]  ?Enc Vitals Group  ?   BP 137/74  ?   Pulse Rate 96  ?   Resp 16  ?   Temp 97.8 ?F (36.6 ?C)  ?   Temp src   ?   SpO2 100 %  ?   Weight   ?   Height   ?   Head Circumference   ?   Peak Flow   ?   Pain Score   ?   Pain Loc   ?   Pain Edu?   ?   Excl. in [place]?   ? ? ?Most recent vital signs: ?Vitals:  ? 05/25/21 1430 05/25/21 1445  ?BP: 127/86   ?Pulse: 90 88  ?Resp: 20 18  ?Temp:    ?SpO2: 98% 99%  ? ? ?General Awake, no distress.  ?HEENT NCAT. PERRL. EOMI. Sclera anicteric. No rhinorrhea. Mucous membranes are moist.  ?CV:  Good peripheral perfusion.  ?RESP:  Normal effort.  ?ABD:  No distention. Soft, nontender ? ?ED Results / Procedures / Treatments  ? ?Labs ?(all labs ordered are listed, but only abnormal results are displayed) ?Labs Reviewed  ?CBC WITH  DIFFERENTIAL/PLATELET - Abnormal; Notable for the following components:  ?    Result Value  ? RBC 3.50 (*)   ? MCV 106.6 (*)   ? MCH 35.4 (*)   ? All other components within normal limits  ?COMPREHENSIVE METABOLIC PANEL - Abnormal; Notable for the following components:  ? Sodium 131 (*)   ? Chloride 96 (*)   ? CO2 13 (*)   ? Glucose, Bld 121 (*)   ? Calcium 7.3 (*)   ? Albumin 3.1 (*)   ? AST 114 (*)   ? Alkaline Phosphatase 161 (*)   ? Total Bilirubin 1.6 (*)   ? Anion gap 22 (*)   ? All other components within normal limits  ?ETHANOL - Abnormal; Notable for the following components:  ? Alcohol, Ethyl (B) 76 (*)   ? All other components within normal limits  ?URINALYSIS, ROUTINE W REFLEX MICROSCOPIC - Abnormal; Notable for the following components:  ? Color, Urine YELLOW (*)   ? APPearance HAZY (*)   ? Hgb urine dipstick SMALL (*)   ? Ketones, ur 80 (*)   ? Protein, ur 30 (*)   ? Leukocytes,Ua  MODERATE (*)   ? Bacteria, UA RARE (*)   ? All other components within normal limits  ?URINE DRUG SCREEN, QUALITATIVE ([facility] ONLY) - Abnormal; Notable for the following components:  ? Benzodiazepine, Ur Scrn POSITIVE (*)   ? All other components within normal limits  ?LACTIC ACID, PLASMA - Abnormal; Notable for the following components:  ? Lactic Acid, Venous 5.2 (*)   ? All other components within normal limits  ?LACTIC ACID, PLASMA - Abnormal; Notable for the following components:  ? Lactic Acid, Venous 2.6 (*)   ? All other components within normal limits  ?BASIC METABOLIC PANEL - Abnormal; Notable for the following components:  ? Sodium 122 (*)   ? Chloride 91 (*)   ? CO2 19 (*)   ? Glucose, Bld 297 (*)   ? Calcium 6.7 (*)   ? All other components within normal limits  ?LIPASE, BLOOD  ? ? ? ?EKG ? ?Vent. rate 98 BPM ?PR interval 151 ms ?QRS duration 96 ms ?QT/QTcB 385/492 ms ?P-R-T axes 46 36 58 ?Normal axis ?No STEMI ? ?RADIOLOGY ? ?I personally viewed and evaluated these images as part of my medical decision making, as  well as reviewing the written report by the radiologist. ? ?ED Provider Interpretation: no acute findings} ? ?CT Angio Abd/Pel W and/or Wo Contrast ? ?Result Date: 05/25/2021 ?CLINICAL DATA:  Nausea, emesis, abdominal pain EXAM: CTA ABDOMEN AND PELVIS WITHOUT AND WITH CONTRAST TECHNIQUE: Multidetector CT imaging of the abdomen and pelvis was performed using the standard protocol during bolus administration of intravenous contrast. Multiplanar reconstructed images and MIPs were obtained and reviewed to evaluate the vascular anatomy. RADIATION DOSE REDUCTION: This exam was performed according to the departmental dose-optimization program which includes automated exposure control, adjustment of the mA and/or kV according to patient size and/or use of iterative reconstruction technique. CONTRAST:  64m OMNIPAQUE IOHEXOL 350 MG/ML SOLN COMPARISON:  05/15/2021 and previous FINDINGS: VASCULAR Aorta: Moderately severe partially calcified atheromatous plaque. No aneurysm, dissection, or stenosis. Celiac: Patent without evidence of aneurysm, dissection, vasculitis or significant stenosis. SMA: Patent without evidence of aneurysm, dissection, vasculitis or significant stenosis. Renals: Diminutive left renal artery with marked renal parenchymal atrophy. Duplicated right renal arteries, Co dominant, both patent. IMA: Patent without evidence of aneurysm, dissection, vasculitis or significant stenosis. Inflow: Mild bilateral common iliac plaque. No aneurysm, dissection, or stenosis. Proximal Outflow: Bilateral common femoral and visualized portions of the superficial and profunda femoral arteries are patent without evidence of aneurysm, dissection, vasculitis or significant stenosis. Veins: No obvious venous abnormality within the limitations of this arterial phase study. Review of the MIP images confirms the above findings. NON-VASCULAR Lower chest: Moderate hiatal hernia. No pleural or pericardial effusion. Hepatobiliary: Fatty  liver without focal lesion. Post cholecystectomy. No intrahepatic biliary ductal dilatation. Chronic prominence of the CBD. Pancreas: Unremarkable. No pancreatic ductal dilatation or surrounding inflammatory changes. Spleen: Normal in size without focal abnormality. Adrenals/Urinary Tract: No adrenal mass. Right kidney unremarkable. Chronic atrophy and cystic change of the left kidney with dominant 7 cm inferior cystic component, previously 6.2 cm on 03/14/2014. Urinary bladder incompletely distended. Stomach/Bowel: Moderate hiatal hernia. The stomach is nondistended. Small bowel decompressed. Appendix not discretely identified. The colon is nondistended with multiple descending and sigmoid segment diverticula; no adjacent inflammatory change. Lymphatic: No abdominal or pelvic adenopathy. Reproductive: Uterus and bilateral adnexa are unremarkable. Other: No ascites.  No free air. Musculoskeletal: Mild lumbar levoscoliosis apex L4. No fracture or worrisome bone lesion. IMPRESSION: 1. No acute  findings. 2. Descending and sigmoid diverticulosis 3. Moderate hiatal hernia 4. Fatty liver 5. Chronic left renal atrophy and cystic change Electronically Signed   By: Lucrezia Europe M.D.   On: 05/25/2021 15:47   ? ? ?PROCEDURES: ? ?Critical Care performed: No ? ?Procedures ? ? ?MEDICATIONS ORDERED IN ED: ?Medications  ?ondansetron (ZOFRAN) injection 4 mg (4 mg Intravenous Given 05/25/21 1208)  ?metoCLOPramide (REGLAN) injection 10 mg (10 mg Intravenous Given 05/25/21 1218)  ?sodium chloride 0.9 % bolus 1,000 mL (0 mLs Intravenous Stopped 05/25/21 1633)  ?dextrose 5 % and 0.9% NaCl 5-0.9 % bolus 1,000 mL (0 mLs Intravenous Stopped 05/25/21 1633)  ?thiamine (B-1) injection 100 mg (100 mg Intravenous Given 05/25/21 1324)  ?iohexol (OMNIPAQUE) 350 MG/ML injection 75 mL (75 mLs Intravenous Contrast Given 05/25/21 1502)  ? ? ? ?IMPRESSION / MDM / ASSESSMENT AND PLAN / ED COURSE  ?I reviewed the triage vital signs and the nursing notes. ?              ?               ? ?Differential diagnosis includes, but is not limited to, biliary disease (biliary colic, cholangitis, choledocholithiasis, etc), intrathoracic causes for epigastric abdominal pain including ACS

## 2021-05-25 NOTE — ED Notes (Signed)
Pt at doorway and insisting needs to leave now. Pt status switched to AMA. Pt explained that we needed to follow up repeat labs after IV fluids but refuses to stay. Ivs removed and IVF stopped. ? ?E signature pad does not work and Education officer, community was placed this morning. Paper AMA form printed out and signed by pt and this nurse. Pt walking out with steady gait noted. ?

## 2021-05-25 NOTE — ED Notes (Signed)
Pt refusing 20g IV for CTA, explaining importance of getting ordered scans. EDP informed. ?

## 2021-05-25 NOTE — ED Triage Notes (Signed)
Pt to ED from home [facility] ?Pt called for dizziness, n/v, lightheaded, unable to keep food or fluids down since 3 days ?CBG was 62 upon arrival, received 17m of D10 ?CBG now 122 ?12 lead ST, 133/80 ? ?22# L AC ?Also received 100 mL NS ?Pt had laparoscopic gallbladder surgery 4 wks ago, still has stitches to R abdomen, had f/u appt tomorrow ?ALso has hernia to L side abdomen ? ?Pt also complains of depression and recently quit drinking alcohol, recently moved. Denies SI. States is just frustrated with being sick. ? ?Pt endorses drank "a couple mixed drinks" yesterday ?Pt states feels weak and slightly nauseous, cannot hold food down ? ?Pt states "I think I need an endoscopy". N/V have been ongoing since before gallbladder removed. ? ?Pt recently treated for UTI, states has slight burning with urination still. ?

## 2021-05-28 ENCOUNTER — Ambulatory Visit (INDEPENDENT_AMBULATORY_CARE_PROVIDER_SITE_OTHER): Payer: PPO | Admitting: Internal Medicine

## 2021-05-28 ENCOUNTER — Encounter: Payer: Self-pay | Admitting: Internal Medicine

## 2021-05-28 VITALS — BP 110/68 | HR 105 | Temp 97.6°F | Ht 61.0 in | Wt 97.4 lb

## 2021-05-28 DIAGNOSIS — R7303 Prediabetes: Secondary | ICD-10-CM | POA: Diagnosis not present

## 2021-05-28 DIAGNOSIS — K7010 Alcoholic hepatitis without ascites: Secondary | ICD-10-CM | POA: Diagnosis not present

## 2021-05-28 DIAGNOSIS — E162 Hypoglycemia, unspecified: Secondary | ICD-10-CM | POA: Diagnosis not present

## 2021-05-28 DIAGNOSIS — E441 Mild protein-calorie malnutrition: Secondary | ICD-10-CM | POA: Diagnosis not present

## 2021-05-28 DIAGNOSIS — E538 Deficiency of other specified B group vitamins: Secondary | ICD-10-CM | POA: Diagnosis not present

## 2021-05-28 DIAGNOSIS — D696 Thrombocytopenia, unspecified: Secondary | ICD-10-CM | POA: Diagnosis not present

## 2021-05-28 LAB — POCT GLYCOSYLATED HEMOGLOBIN (HGB A1C): Hemoglobin A1C: 4.7 % (ref 4.0–5.6)

## 2021-05-28 MED ORDER — CYANOCOBALAMIN 1000 MCG/ML IJ SOLN
1000.0000 ug | Freq: Once | INTRAMUSCULAR | Status: AC
  Administered 2021-05-28: 1000 ug via INTRAMUSCULAR

## 2021-05-28 NOTE — Assessment & Plan Note (Signed)
Recurrent,  Still drinking,  Last evening was last drink .  Plans to enter rehab in [place] after her cataract surgery on May 1 ?

## 2021-05-28 NOTE — Assessment & Plan Note (Signed)
Aggravated by alcohol abuse.  She presented to ED on April 2 with hypoglycemia,  A repeat  ? ? ?

## 2021-05-28 NOTE — Patient Instructions (Addendum)
Please do not postpone rehab any longer than you HAVE to (following cataract surgery; don't wait to have your EGD and colonoscopy because they have been rescheduled for October 21) ? ?I recommend taking  a multivitamin that includes all b vitamins  ? ?Stop the magnesium supplements ? ? ? you should consider adding one of these newer second generation antihistamines that are longer acting, non sedating and  available OTC.  They will not raise your blood pressure unless you buy the ones with a "D" on the end of the name ? ?Generic  Zyrtec, which is cetirizine.  ? ? generic Allegra , available generically as fexofenadine ; comes in 60 mg and 180 mg once daily strengths.   ? ?Generic Claritin :  also available as loratidine .   ? ?You need to call [facility] clinic to reschedule your GI procedures [phone] ? ? ? ?

## 2021-05-28 NOTE — Assessment & Plan Note (Signed)
Secondary to alcohol abuse.  Treated in ER with D10 .  a1c is normal ? ?Lab Results  ?Component Value Date  ? HGBA1C 4.7 05/28/2021  ? ? ?

## 2021-05-28 NOTE — Progress Notes (Signed)
? ?Subjective:  ?Patient ID: Kathy [name], female    DOB: 1960/10/31  Age: 61 y.o. MRN: [id number] ? ?CC: The primary encounter diagnosis was Hypoglycemia. Diagnoses of Thrombocytopenia ([place]), Prediabetes, Mild malnutrition ([place]), Alcoholic hepatitis without ascites, and B12 deficiency were also pertinent to this visit. ? ? ?This visit occurred during the SARS-CoV-2 public health emergency.  Safety protocols were in place, including screening questions prior to the visit, additional usage of staff PPE, and extensive cleaning of exam room while observing appropriate contact time as indicated for disinfecting solutions.   ? ?HPI ?Kathy [name] presents for follow up on alcohol abuse with lactic acidosis and electrolyte disturbances  noted on recent ER visits.  ?Chief Complaint  ?Patient presents with  ? Follow-up  ? ?Kathy [name] has had 2 ER visits since her last OV one month ago.  Both were notable for alcohol intoxication and alcoholic hepatitis with lactic acidosis. And hypoglycemia .   She was given Reglan to take home.  She drank 3 drinks last night because she had friends visiting who "wanted to party." ? ?Reviewed her last OV at which time she was not truthful about her alcohol use.  Alcohol level was elevated.  ? ? ?She is feeling better today and planning to go to rehab after her cataract surgery on May 1.    Taking mirtazapine at night and appetite is improving.   Yesterday had 3 lmeals.  And is gaining weight . ? ?Still taking mg oxide 250 mg daily and having loose stools  reviewed all recent labs  ? ?Outpatient Medications Prior to Visit  ?Medication Sig Dispense Refill  ? cyanocobalamin (,VITAMIN B-12,) 1000 MCG/ML injection Inject 1 mL (1,000 mcg total) into the muscle every 14 (fourteen) days. 6 mL 3  ? denosumab (PROLIA) 60 MG/ML SOSY injection Inject 60 mg into the skin every 6 (six) months.    ? metoCLOPramide (REGLAN) 10 MG tablet Take 1 tablet (10 mg total) by mouth 3 (three) times daily  with meals for 10 days. 30 tablet 0  ? mirtazapine (REMERON) 7.5 MG tablet Take 1 tablet (7.5 mg total) by mouth at bedtime. 90 tablet 1  ? omeprazole (PRILOSEC) 40 MG capsule Take 1 capsule (40 mg total) by mouth 2 (two) times daily. 180 capsule 1  ? ondansetron (ZOFRAN-ODT) 8 MG disintegrating tablet Take 1 tablet (8 mg total) by mouth every 8 (eight) hours as needed for nausea or vomiting. 20 tablet 0  ? potassium chloride SA (KLOR-CON M) 20 MEQ tablet Take 1 tablet (20 mEq total) by mouth 3 (three) times daily. 90 tablet 0  ? Syringe/Needle, Disp, (SYRINGE 3CC/25GX1") 25G X 1" 3 ML MISC Use for b12 injections 50 each 0  ? clorazepate (TRANXENE-T) 7.5 MG tablet Take 1 tablet (7.5 mg total) by mouth at bedtime as needed for anxiety. (Patient not taking: Reported on 05/28/2021) 30 tablet 0  ? HYDROcodone-acetaminophen (NORCO/VICODIN) 5-325 MG tablet Take 1 tablet by mouth every 6 (six) hours as needed for moderate pain. (Patient not taking: Reported on 05/08/2021) 15 tablet 0  ? Magnesium Oxide 400 MG CAPS Take 1 capsule (400 mg total) by mouth 2 (two) times daily after a meal. (Patient not taking: Reported on 05/28/2021) 60 capsule 0  ? ?No facility-administered medications prior to visit.  ? ? ?Review of Systems; ? ?Patient denies headache, fevers, malaise, unintentional weight loss, skin rash, eye pain, sinus congestion and sinus pain, sore throat, dysphagia,  hemoptysis , cough, dyspnea, wheezing, chest  pain, palpitations, orthopnea, edema, abdominal pain, nausea, melena, diarrhea, constipation, flank pain, dysuria, hematuria, urinary  Frequency, nocturia, numbness, tingling, seizures,  Focal weakness, Loss of consciousness,  Tremor, insomnia, depression, anxiety, and suicidal ideation.   ? ? ? ?Objective:  ?BP 110/68 (BP Location: Left Arm, Patient Position: Sitting, Cuff Size: Normal)   Pulse (!) 105   Temp 97.6 ?F (36.4 ?C) (Oral)   Ht '5\' 1"'$  (1.549 m)   Wt 97 lb 6.4 oz (44.2 kg)   SpO2 99%   BMI 18.40 kg/m?   ? ?BP Readings from Last 3 Encounters:  ?05/28/21 110/68  ?05/25/21 127/86  ?05/15/21 122/80  ? ? ?Wt Readings from Last 3 Encounters:  ?05/28/21 97 lb 6.4 oz (44.2 kg)  ?05/25/21 97 lb (44 kg)  ?05/08/21 95 lb 12.8 oz (43.5 kg)  ? ? ?General appearance: alert, cooperative and appears stated age ?Ears: normal TM's and external ear canals both ears ?Throat: lips, mucosa, and tongue normal; teeth and gums normal ?Neck: no adenopathy, no carotid bruit, supple, symmetrical, trachea midline and thyroid not enlarged, symmetric, no tenderness/mass/nodules ?Back: symmetric, no curvature. ROM normal. No CVA tenderness. ?Lungs: clear to auscultation bilaterally ?Heart: regular rate and rhythm, S1, S2 normal, no murmur, click, rub or gallop ?Abdomen: soft, non-tender; bowel sounds normal; no masses,  no organomegaly ?Pulses: 2+ and symmetric ?Skin: Skin color, texture, turgor normal. No rashes or lesions ?Lymph nodes: Cervical, supraclavicular, and axillary nodes normal. ? ?Lab Results  ?Component Value Date  ? HGBA1C 4.7 05/28/2021  ? HGBA1C 5.4 10/03/2018  ? HGBA1C 5.9 02/21/2018  ? ? ?Lab Results  ?Component Value Date  ? CREATININE 0.60 05/25/2021  ? CREATININE 0.59 05/25/2021  ? CREATININE 0.68 05/15/2021  ? ? ?Lab Results  ?Component Value Date  ? WBC 4.5 05/25/2021  ? HGB 12.4 05/25/2021  ? HCT 37.3 05/25/2021  ? PLT 195 05/25/2021  ? GLUCOSE 297 (H) 05/25/2021  ? CHOL 187 10/03/2018  ? TRIG 86 10/03/2018  ? HDL 95 10/03/2018  ? LDLDIRECT 119.0 10/11/2014  ? [place] 75 10/03/2018  ? ALT 41 05/25/2021  ? AST 114 (H) 05/25/2021  ? NA 122 (L) 05/25/2021  ? K 3.8 05/25/2021  ? CL 91 (L) 05/25/2021  ? CREATININE 0.60 05/25/2021  ? BUN 6 05/25/2021  ? CO2 19 (L) 05/25/2021  ? TSH 1.290 08/11/2018  ? HGBA1C 4.7 05/28/2021  ? ? ?CT Angio Abd/Pel W and/or Wo Contrast ? ?Result Date: 05/25/2021 ?CLINICAL DATA:  Nausea, emesis, abdominal pain EXAM: CTA ABDOMEN AND PELVIS WITHOUT AND WITH CONTRAST TECHNIQUE: Multidetector CT imaging  of the abdomen and pelvis was performed using the standard protocol during bolus administration of intravenous contrast. Multiplanar reconstructed images and MIPs were obtained and reviewed to evaluate the vascular anatomy. RADIATION DOSE REDUCTION: This exam was performed according to the departmental dose-optimization program which includes automated exposure control, adjustment of the mA and/or kV according to patient size and/or use of iterative reconstruction technique. CONTRAST:  54m OMNIPAQUE IOHEXOL 350 MG/ML SOLN COMPARISON:  05/15/2021 and previous FINDINGS: VASCULAR Aorta: Moderately severe partially calcified atheromatous plaque. No aneurysm, dissection, or stenosis. Celiac: Patent without evidence of aneurysm, dissection, vasculitis or significant stenosis. SMA: Patent without evidence of aneurysm, dissection, vasculitis or significant stenosis. Renals: Diminutive left renal artery with marked renal parenchymal atrophy. Duplicated right renal arteries, Co dominant, both patent. IMA: Patent without evidence of aneurysm, dissection, vasculitis or significant stenosis. Inflow: Mild bilateral common iliac plaque. No aneurysm, dissection, or stenosis. Proximal Outflow:  Bilateral common femoral and visualized portions of the superficial and profunda femoral arteries are patent without evidence of aneurysm, dissection, vasculitis or significant stenosis. Veins: No obvious venous abnormality within the limitations of this arterial phase study. Review of the MIP images confirms the above findings. NON-VASCULAR Lower chest: Moderate hiatal hernia. No pleural or pericardial effusion. Hepatobiliary: Fatty liver without focal lesion. Post cholecystectomy. No intrahepatic biliary ductal dilatation. Chronic prominence of the CBD. Pancreas: Unremarkable. No pancreatic ductal dilatation or surrounding inflammatory changes. Spleen: Normal in size without focal abnormality. Adrenals/Urinary Tract: No adrenal mass. Right  kidney unremarkable. Chronic atrophy and cystic change of the left kidney with dominant 7 cm inferior cystic component, previously 6.2 cm on 03/14/2014. Urinary bladder incompletely distended. Stomach/Bowel

## 2021-05-28 NOTE — Assessment & Plan Note (Signed)
Secondary to alcohol abuse and gastritis.  Appetite improving with mirtazapine and gastritiis resolved  ?

## 2021-05-28 NOTE — Assessment & Plan Note (Signed)
Resolved by last check ? ?Lab Results  ?Component Value Date  ? WBC 4.5 05/25/2021  ? HGB 12.4 05/25/2021  ? HCT 37.3 05/25/2021  ? MCV 106.6 (H) 05/25/2021  ? PLT 195 05/25/2021  ? ? ?

## 2021-06-10 ENCOUNTER — Encounter: Payer: Self-pay | Admitting: Surgery

## 2021-06-10 ENCOUNTER — Ambulatory Visit (INDEPENDENT_AMBULATORY_CARE_PROVIDER_SITE_OTHER): Payer: PPO | Admitting: Surgery

## 2021-06-10 VITALS — BP 139/95 | HR 118 | Temp 98.0°F | Ht 62.0 in | Wt 95.2 lb

## 2021-06-10 DIAGNOSIS — Z09 Encounter for follow-up examination after completed treatment for conditions other than malignant neoplasm: Secondary | ICD-10-CM

## 2021-06-10 DIAGNOSIS — K8010 Calculus of gallbladder with chronic cholecystitis without obstruction: Secondary | ICD-10-CM

## 2021-06-10 DIAGNOSIS — Z9049 Acquired absence of other specified parts of digestive tract: Secondary | ICD-10-CM

## 2021-06-10 NOTE — Patient Instructions (Addendum)
If you have any concerns or questions, please feel free to call our office. Follow up as needed.  ? ?Advised to pursue a goal of 25 to 30 g of fiber daily.  Made aware that the majority of this may be through natural sources, but advised to be aware of actual consumption and to ensure minimal consumption by daily supplementation.  Various forms of supplements discussed.  Recommended Psyllium husk, that mixes well with applesauce, or the powder which goes down well shaken with chocolate milk.  ?Strongly advised to consume more fluids to ensure adequate hydration, instructed to watch color of urine to determine adequacy of hydration.  Clarity is pursued in urine output, and bowel activity that correlates to significant meal intake.   ?We need to avoid deferring having bowel movements, advised to take the time at the first sign of sensation, typically following meals, and in the morning.   ?Subsequent utilization of MiraLAX may be needed ensure at least daily movement, ideally twice daily bowel movements.  If multiple doses of MiraLAX are necessary utilize them. ?Never skip a day...  ?To be regular, we must do the above EVERY day.  ? ? ?Nausea, Adult ?Nausea is feeling like you may vomit. Feeling like you may vomit is usually not serious, but it may be an early sign of a more serious medical problem. Vomiting is when stomach contents forcefully come out of your mouth. ?If you vomit, or if you are not able to drink enough fluids, you may not have enough water in your body (get dehydrated). If you do not have enough water in your body, you may: ?Feel tired. ?Feel thirsty. ?Have a dry mouth. ?Have cracked lips. ?Pee (urinate) less often. ?Older adults and people who have other diseases or a weak body defense system (immune system) have a higher risk of not having enough water in the body. ?The main goals of treating this condition are: ?To relieve your nausea. ?To ensure your nausea occurs less often. ?To prevent vomiting  and losing too much fluid. ?Follow these instructions at home: ?Watch your symptoms for any changes. Tell your doctor about them. ?Eating and drinking ? ?  ? ?Take an ORS (oral rehydration solution). This is a drink that is sold at pharmacies and stores. ?Drink clear fluids in small amounts as you are able. These include: ?Water. ?Ice chips. ?Fruit juice that has water added (diluted fruit juice). ?Low-calorie sports drinks. ?Eat bland, easy-to-digest foods in small amounts as you are able, such as: ?Bananas. ?Applesauce. ?Rice. ?Low-fat (lean) meats. ?Toast. ?Crackers. ?Avoid drinking fluids that have a lot of sugar or caffeine in them. This includes energy drinks, sports drinks, and soda. ?Avoid alcohol. ?Avoid spicy or fatty foods. ?General instructions ?Take over-the-counter and prescription medicines only as told by your doctor. ?Rest at home while you get better. ?Drink enough fluid to keep your pee (urine) pale yellow. ?Take slow and deep breaths when you feel like you may vomit. ?Avoid food or things that have strong smells. ?Wash your hands often with soap and water for at least 20 seconds. If you cannot use soap and water, use hand sanitizer. ?Make sure that everyone in your home washes their hands well and often. ?Keep all follow-up visits. ?Contact a doctor if: ?You feel worse. ?You feel like you may vomit and this lasts for more than 2 days. ?You vomit. ?You are not able to drink fluids without vomiting. ?You have new symptoms. ?You have a fever. ?You have a headache. ?  You have muscle cramps. ?You have a rash. ?You have pain while peeing. ?You feel light-headed or dizzy. ?Get help right away if: ?You have pain in your chest, neck, arm, or jaw. ?You feel very weak or you faint. ?You have vomit that is bright red or looks like coffee grounds. ?You have bloody or black poop (stools) or poop that looks like tar. ?You have a very bad headache, a stiff neck, or both. ?You have very bad pain, cramping, or  bloating in your belly (abdomen). ?You have trouble breathing or you are breathing very quickly. ?Your heart is beating very quickly. ?Your skin feels cold and clammy. ?You feel confused. ?You have signs of losing too much water in your body, such as: ?Dark pee, very little pee, or no pee. ?Cracked lips. ?Dry mouth. ?Sunken eyes. ?Sleepiness. ?Weakness. ?These symptoms may be an emergency. Get help right away. Call 911. ?Do not wait to see if the symptoms will go away. ?Do not drive yourself to the hospital. ?Summary ?Nausea is feeling like you are about vomit. ?If you vomit, or if you are not able to drink enough fluids, you may not have enough water in your body (get dehydrated). ?Eat and drink what your doctor tells you. Take over-the-counter and prescription medicines only as told by your doctor. ?Contact a doctor right away if your symptoms get worse or you have new symptoms. ?Keep all follow-up visits. ?This information is not intended to replace advice given to you by your health care provider. Make sure you discuss any questions you have with your health care provider. ?Document Revised: 08/16/2020 Document Reviewed: 08/16/2020 ?Elsevier Patient Education ? [place]. ? ?

## 2021-06-10 NOTE — Progress Notes (Signed)
[facility] ?POST-OP OFFICE VISIT ? ?06/10/2021 ? ?HPI: ?Kathy [name] is a 61 y.o. female 65 days s/p robotic cholecystectomy.  Recently in the ED once again for alcohol abuse.  She reports having nausea frequently.  Reports loose bowel movements.  She denies abdominal pain, vomiting, fevers, chills.  She reports normal eating. ? ?Vital signs: ?There were no vitals taken for this visit.  ? ?Physical Exam: ?Constitutional: Overall appears well. ?Abdomen: Nondistended, soft and nontender.  No incisional tenderness issues. ?Skin: Incisions well-healed.  Nonicteric. ? ?Assessment/Plan: ?This is a 61 y.o. female 31 days s/p robotic cholecystectomy. ? ?Patient Active Problem List  ? Diagnosis Date Noted  ? Hypoglycemia 05/28/2021  ? S/P laparoscopic cholecystectomy 05/08/2021  ? 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 Abdominal aortic atherosclerosis ([place]) 12/21/2020  ? Elevated LFTs 10/13/2020  ? Thrombocytopenia ([place]) 10/13/2020  ? Alcoholic hepatitis without ascites 09/09/2020  ? Paraesophageal hiatal hernia 09/09/2020  ? Constipation 05/21/2020  ? Mild malnutrition ([place]) 05/20/2020  ? Mild episode of recurrent major depressive disorder ([place]) 05/20/2020  ? Essential hypertension 10/03/2019  ? Insomnia due to anxiety and fear 09/29/2019  ? GAD (generalized anxiety disorder) 09/19/2018  ? Poor dentition 05/18/2018  ? Tobacco abuse 02/22/2018  ? Atrophy of left kidney 02/22/2018  ? Diverticulosis of colon without diverticulitis 02/22/2018  ? MDD (major depressive disorder) 09/13/2017  ? Osteoporosis 10/17/2016  ? Pernicious anemia 01/01/2016  ? Hyponatremia 09/10/2015  ? Hamm[place]oe 07/31/2015  ? Alcoholic peripheral neuropathy ([place]) 04/23/2015  ? Fatty liver, alcoholic 67/61/9509  ? Alcohol dependence with alcohol-induced mood disorder ([place])   ? History of alcohol dependence ([place]) 07/13/2014  ? ? -Follow-up as needed. ? ? ?Ronny Bacon M.D., FACS ?06/10/2021, 10:43 AM  ?

## 2021-06-20 ENCOUNTER — Ambulatory Visit (INDEPENDENT_AMBULATORY_CARE_PROVIDER_SITE_OTHER): Payer: PPO

## 2021-06-20 DIAGNOSIS — E538 Deficiency of other specified B group vitamins: Secondary | ICD-10-CM | POA: Diagnosis not present

## 2021-06-20 MED ORDER — CYANOCOBALAMIN 1000 MCG/ML IJ SOLN
1000.0000 ug | Freq: Once | INTRAMUSCULAR | Status: AC
  Administered 2021-06-30: 1000 ug via INTRAMUSCULAR

## 2021-06-20 NOTE — Progress Notes (Signed)
Patient presented for B 12 injection to left deltoid, patient voiced no concerns nor showed any signs of distress during injection. 

## 2021-06-23 DIAGNOSIS — H268 Other specified cataract: Secondary | ICD-10-CM | POA: Diagnosis not present

## 2021-06-30 DIAGNOSIS — E538 Deficiency of other specified B group vitamins: Secondary | ICD-10-CM | POA: Diagnosis not present

## 2021-07-01 ENCOUNTER — Ambulatory Visit
Admission: RE | Admit: 2021-07-01 | Discharge: 2021-07-01 | Disposition: A | Discharge status: Home / Self Care | Payer: PPO | Source: Ambulatory Visit | Attending: Internal Medicine | Admitting: Internal Medicine

## 2021-07-01 DIAGNOSIS — Z1231 Encounter for screening mammogram for malignant neoplasm of breast: Secondary | ICD-10-CM | POA: Diagnosis not present

## 2021-07-02 ENCOUNTER — Telehealth: Payer: Self-pay | Admitting: Internal Medicine

## 2021-07-02 NOTE — Telephone Encounter (Signed)
Patient needs a prescription for restless legs, non-opioid. Pharmacy [facility]. ?

## 2021-07-02 NOTE — Telephone Encounter (Signed)
Pt is aware that medication cannot be prescribed for a new problem. Offered pt an appt and she stated that she would call back later to schedule. ?

## 2021-07-15 DIAGNOSIS — H2511 Age-related nuclear cataract, right eye: Secondary | ICD-10-CM | POA: Diagnosis not present

## 2021-07-23 ENCOUNTER — Encounter: Payer: Self-pay | Admitting: Ophthalmology

## 2021-07-24 NOTE — Discharge Instructions (Signed)

## 2021-07-29 ENCOUNTER — Encounter
Admission: RE | Disposition: A | Discharge status: Home / Self Care | Payer: Self-pay | Source: Ambulatory Visit | Attending: Ophthalmology

## 2021-07-29 ENCOUNTER — Ambulatory Visit
Admission: RE | Admit: 2021-07-29 | Discharge: 2021-07-29 | Disposition: A | Discharge status: Home / Self Care | Payer: PPO | Source: Ambulatory Visit | Attending: Ophthalmology | Admitting: Ophthalmology

## 2021-07-29 ENCOUNTER — Ambulatory Visit: Payer: PPO | Admitting: Anesthesiology

## 2021-07-29 ENCOUNTER — Other Ambulatory Visit: Payer: Self-pay

## 2021-07-29 ENCOUNTER — Telehealth: Payer: Self-pay | Admitting: Internal Medicine

## 2021-07-29 ENCOUNTER — Encounter: Payer: Self-pay | Admitting: Ophthalmology

## 2021-07-29 DIAGNOSIS — F1021 Alcohol dependence, in remission: Secondary | ICD-10-CM

## 2021-07-29 DIAGNOSIS — K8010 Calculus of gallbladder with chronic cholecystitis without obstruction: Secondary | ICD-10-CM

## 2021-07-29 DIAGNOSIS — F1024 Alcohol dependence with alcohol-induced mood disorder: Secondary | ICD-10-CM

## 2021-07-29 DIAGNOSIS — E871 Hypo-osmolality and hyponatremia: Secondary | ICD-10-CM

## 2021-07-29 DIAGNOSIS — J449 Chronic obstructive pulmonary disease, unspecified: Secondary | ICD-10-CM | POA: Insufficient documentation

## 2021-07-29 DIAGNOSIS — F329 Major depressive disorder, single episode, unspecified: Secondary | ICD-10-CM

## 2021-07-29 DIAGNOSIS — M2040 Other hammer toe(s) (acquired), unspecified foot: Secondary | ICD-10-CM

## 2021-07-29 DIAGNOSIS — E441 Mild protein-calorie malnutrition: Secondary | ICD-10-CM

## 2021-07-29 DIAGNOSIS — H25811 Combined forms of age-related cataract, right eye: Secondary | ICD-10-CM | POA: Diagnosis not present

## 2021-07-29 DIAGNOSIS — K449 Diaphragmatic hernia without obstruction or gangrene: Secondary | ICD-10-CM

## 2021-07-29 DIAGNOSIS — G621 Alcoholic polyneuropathy: Secondary | ICD-10-CM

## 2021-07-29 DIAGNOSIS — H2511 Age-related nuclear cataract, right eye: Secondary | ICD-10-CM | POA: Insufficient documentation

## 2021-07-29 DIAGNOSIS — Z720 Tobacco use: Secondary | ICD-10-CM

## 2021-07-29 DIAGNOSIS — K5730 Diverticulosis of large intestine without perforation or abscess without bleeding: Secondary | ICD-10-CM

## 2021-07-29 DIAGNOSIS — M810 Age-related osteoporosis without current pathological fracture: Secondary | ICD-10-CM

## 2021-07-29 DIAGNOSIS — D510 Vitamin B12 deficiency anemia due to intrinsic factor deficiency: Secondary | ICD-10-CM

## 2021-07-29 DIAGNOSIS — R7989 Other specified abnormal findings of blood chemistry: Secondary | ICD-10-CM

## 2021-07-29 DIAGNOSIS — Z9049 Acquired absence of other specified parts of digestive tract: Secondary | ICD-10-CM

## 2021-07-29 DIAGNOSIS — F330 Major depressive disorder, recurrent, mild: Secondary | ICD-10-CM

## 2021-07-29 DIAGNOSIS — I700 Atherosclerosis of aorta: Secondary | ICD-10-CM

## 2021-07-29 DIAGNOSIS — F5105 Insomnia due to other mental disorder: Secondary | ICD-10-CM

## 2021-07-29 DIAGNOSIS — K089 Disorder of teeth and supporting structures, unspecified: Secondary | ICD-10-CM

## 2021-07-29 DIAGNOSIS — I10 Essential (primary) hypertension: Secondary | ICD-10-CM

## 2021-07-29 DIAGNOSIS — F409 Phobic anxiety disorder, unspecified: Secondary | ICD-10-CM

## 2021-07-29 DIAGNOSIS — K7010 Alcoholic hepatitis without ascites: Secondary | ICD-10-CM

## 2021-07-29 DIAGNOSIS — K5900 Constipation, unspecified: Secondary | ICD-10-CM

## 2021-07-29 DIAGNOSIS — E119 Type 2 diabetes mellitus without complications: Secondary | ICD-10-CM

## 2021-07-29 DIAGNOSIS — F411 Generalized anxiety disorder: Secondary | ICD-10-CM

## 2021-07-29 DIAGNOSIS — E162 Hypoglycemia, unspecified: Secondary | ICD-10-CM

## 2021-07-29 DIAGNOSIS — F17210 Nicotine dependence, cigarettes, uncomplicated: Secondary | ICD-10-CM | POA: Insufficient documentation

## 2021-07-29 DIAGNOSIS — D696 Thrombocytopenia, unspecified: Secondary | ICD-10-CM

## 2021-07-29 DIAGNOSIS — K700 Alcoholic fatty liver: Secondary | ICD-10-CM

## 2021-07-29 DIAGNOSIS — N261 Atrophy of kidney (terminal): Secondary | ICD-10-CM

## 2021-07-29 HISTORY — DX: Fatty (change of) liver, not elsewhere classified: K76.0

## 2021-07-29 HISTORY — PX: CATARACT EXTRACTION W/PHACO: SHX586

## 2021-07-29 HISTORY — DX: Presence of dental prosthetic device (complete) (partial): Z97.2

## 2021-07-29 SURGERY — PHACOEMULSIFICATION, CATARACT, WITH IOL INSERTION
Anesthesia: Monitor Anesthesia Care | Site: Eye | Laterality: Right

## 2021-07-29 MED ORDER — TRYPAN BLUE 0.06 % IO SOSY
PREFILLED_SYRINGE | INTRAOCULAR | Status: DC | PRN
  Administered 2021-07-29: 0.5 mL via INTRAOCULAR

## 2021-07-29 MED ORDER — MOXIFLOXACIN HCL 0.5 % OP SOLN
OPHTHALMIC | Status: DC | PRN
  Administered 2021-07-29: 0.2 mL via OPHTHALMIC

## 2021-07-29 MED ORDER — SIGHTPATH DOSE#1 BSS IO SOLN
INTRAOCULAR | Status: DC | PRN
  Administered 2021-07-29: 59 mL via OPHTHALMIC

## 2021-07-29 MED ORDER — LACTATED RINGERS IV SOLN: INTRAVENOUS | Status: DC

## 2021-07-29 MED ORDER — ARMC OPHTHALMIC DILATING DROPS
1.0000 "application " | OPHTHALMIC | Status: DC | PRN
  Administered 2021-07-29 (×3): 1 via OPHTHALMIC

## 2021-07-29 MED ORDER — FENTANYL CITRATE (PF) 100 MCG/2ML IJ SOLN
INTRAMUSCULAR | Status: DC | PRN
  Administered 2021-07-29: 100 ug via INTRAVENOUS

## 2021-07-29 MED ORDER — BRIMONIDINE TARTRATE-TIMOLOL 0.2-0.5 % OP SOLN
OPHTHALMIC | Status: DC | PRN
  Administered 2021-07-29: 1 [drp] via OPHTHALMIC

## 2021-07-29 MED ORDER — SIGHTPATH DOSE#1 BSS IO SOLN
INTRAOCULAR | Status: DC | PRN
  Administered 2021-07-29: 15 mL via INTRAOCULAR

## 2021-07-29 MED ORDER — MIDAZOLAM HCL 2 MG/2ML IJ SOLN
INTRAMUSCULAR | Status: DC | PRN
  Administered 2021-07-29: 2 mg via INTRAVENOUS

## 2021-07-29 MED ORDER — SIGHTPATH DOSE#1 BSS IO SOLN
INTRAOCULAR | Status: DC | PRN
  Administered 2021-07-29: 2 mL

## 2021-07-29 MED ORDER — SIGHTPATH DOSE#1 NA CHONDROIT SULF-NA HYALURON 40-17 MG/ML IO SOLN
INTRAOCULAR | Status: DC | PRN
  Administered 2021-07-29: 1 mL via INTRAOCULAR

## 2021-07-29 MED ORDER — TETRACAINE HCL 0.5 % OP SOLN
1.0000 [drp] | OPHTHALMIC | Status: DC | PRN
  Administered 2021-07-29 (×3): 1 [drp] via OPHTHALMIC

## 2021-07-29 SURGICAL SUPPLY — 12 items
CANNULA ANT/CHMB 27G (MISCELLANEOUS) IMPLANT
CANNULA ANT/CHMB 27GA (MISCELLANEOUS) ×2 IMPLANT
CATARACT SUITE SIGHTPATH (MISCELLANEOUS) ×2 IMPLANT
FEE CATARACT SUITE SIGHTPATH (MISCELLANEOUS) ×1 IMPLANT
GLOVE SURG ENC TEXT LTX SZ8 (GLOVE) ×2 IMPLANT
GLOVE SURG TRIUMPH 8.0 PF LTX (GLOVE) ×2 IMPLANT
LENS IOL TECNIS EYHANCE 24.0 (Intraocular Lens) ×1 IMPLANT
NDL FILTER BLUNT 18X1 1/2 (NEEDLE) ×1 IMPLANT
NEEDLE FILTER BLUNT 18X 1/2SAF (NEEDLE) ×1
NEEDLE FILTER BLUNT 18X1 1/2 (NEEDLE) ×1 IMPLANT
SYR 3ML LL SCALE MARK (SYRINGE) ×2 IMPLANT
WATER STERILE IRR 250ML POUR (IV SOLUTION) ×2 IMPLANT

## 2021-07-29 NOTE — H&P (Signed)
[facility]   Primary Care Physician:  Crecencio Mc, MD Ophthalmologist: Dr.Rochanda Harpham  Pre-Procedure History & Physical: HPI:  Kathy [name] is a 61 y.o. female here for cataract surgery.   Past Medical History:  Diagnosis Date   Abnormal weight loss    Alcohol abuse    Alcohol abuse with withdrawal ([place]) 10/13/2020   Anxiety    Chicken pox    Colon polyps    COPD (chronic obstructive pulmonary disease) (HCC)    Depression    Dyspnea    Fatty liver    Gallbladder mass 09/09/2020   8 x 6 x 6 mm non mobile  mass along GB wall noted on 2018 [place] 8 x  7 x 5 mm non mobile mass along GB wall noted on July  2022 [place]   GERD (gastroesophageal reflux disease)    Hernia of abdominal cavity    Hip fracture (HCC) right   Neuropathy    lower legs   Wears dentures    full upper and lower    Past Surgical History:  Procedure Laterality Date   COLONOSCOPY     ROBOTIC ASSISTED LAPAROSCOPIC CHOLECYSTECTOMY  04/16/2021   [facility]    Prior to Admission medications   Medication Sig Start Date End Date Taking? Authorizing Provider  cyanocobalamin (,VITAMIN B-12,) 1000 MCG/ML injection Inject 1 mL (1,000 mcg total) into the muscle every 14 (fourteen) days. 06/21/19  Yes Crecencio Mc, MD  denosumab (PROLIA) 60 MG/ML SOSY injection Inject 60 mg into the skin every 6 (six) months.   Yes [provider]  gabapentin (NEURONTIN) 400 MG capsule Take 400 mg by mouth 3 (three) times daily. 07/23/21 - currently taking every other day   Yes [provider]  omeprazole (PRILOSEC) 40 MG capsule Take 1 capsule (40 mg total) by mouth 2 (two) times daily. 12/11/20  Yes Crecencio Mc, MD  ondansetron (ZOFRAN-ODT) 8 MG disintegrating tablet Take 1 tablet (8 mg total) by mouth every 8 (eight) hours as needed for nausea or vomiting. 05/15/21  Yes Bradler, Vista Lawman, MD  potassium chloride SA (KLOR-CON M) 20 MEQ tablet Take 1 tablet (20 mEq total) by mouth 3 (three) times daily. 05/13/21   Yes Crecencio Mc, MD  mirtazapine (REMERON) 7.5 MG tablet Take 1 tablet (7.5 mg total) by mouth at bedtime. Patient not taking: Reported on 07/23/2021 05/08/21   Crecencio Mc, MD  Syringe/Needle, Disp, (SYRINGE 3CC/25GX1") 25G X 1" 3 ML MISC Use for b12 injections 06/21/19   Crecencio Mc, MD    Allergies as of 06/24/2021 - Review Complete 06/10/2021  Allergen Reaction Noted   Lyrica [pregabalin] Other (See Comments) 10/17/2016    Family History  Problem Relation Age of Onset   Hypertension Mother    Osteoporosis Mother    Meniere's disease Mother    Cancer Father 22       prostate   Colon cancer Paternal Grandmother    Alcohol abuse Other     Social History   Socioeconomic History   Marital status: Divorced    Spouse name: Not on file   Number of children: Not on file   Years of education: Not on file   Highest education level: Not on file  Occupational History   Not on file  Tobacco Use   Smoking status: Every Day    Packs/day: 0.50    Years: 40.00    Pack years: 20.00    Types: Cigarettes   Smokeless  tobacco: Never   Tobacco comments:    Started smoking age 58.  Quit during pregnancies.  Vaping Use   Vaping Use: Never used  Substance and Sexual Activity   Alcohol use: Yes    Alcohol/week: 42.0 standard drinks    Types: 42 Glasses of wine per week    Comment: 07/23/21 - Pt reports "clean" for 5 yrs.  now drinks approx 6 drinks/wk   Drug use: No   Sexual activity: Not Currently  Other Topics Concern   Not on file  Social History Narrative   Not on file   Social Determinants of Health   Financial Resource Strain: Not on file  Food Insecurity: Not on file  Transportation Needs: Not on file  Physical Activity: Not on file  Stress: Not on file  Social Connections: Not on file  Intimate Partner Violence: Not on file    Review of Systems: See HPI, otherwise negative ROS  Physical Exam: BP 123/83   Pulse 93   Temp (!) 97.4 F (36.3 C)   Ht '5\' 2"'$   (1.575 m)   Wt 44.2 kg   SpO2 100%   BMI 17.81 kg/m  General:   Alert, cooperative in NAD Head:  Normocephalic and atraumatic. Respiratory:  Normal work of breathing. Cardiovascular:  RRR  Impression/Plan: Kathy [name] is here for cataract surgery.  Risks, benefits, limitations, and alternatives regarding cataract surgery have been reviewed with the patient.  Questions have been answered.  All parties agreeable.   Birder Robson, MD  07/29/2021, 9:54 AM

## 2021-07-29 NOTE — Telephone Encounter (Signed)
Pt last Prolia was on 03/26/21. Too soon. She will be due for next injection on or after 09/24/21. I have called to notify patient of this information.

## 2021-07-29 NOTE — Anesthesia Postprocedure Evaluation (Signed)
Anesthesia Post Note  Patient: Kathy [name]  Procedure(s) Performed: CATARACT EXTRACTION PHACO AND INTRAOCULAR LENS PLACEMENT (IOC) RIGHT 19.12 01:27.4 (Right: Eye)     Patient location during evaluation: PACU Anesthesia Type: MAC Level of consciousness: awake and alert Pain management: pain level controlled Vital Signs Assessment: post-procedure vital signs reviewed and stable Respiratory status: spontaneous breathing Cardiovascular status: stable Anesthetic complications: no   No notable events documented.  Gillian Scarce

## 2021-07-29 NOTE — Transfer of Care (Signed)
Immediate Anesthesia Transfer of Care Note  Patient: Kathy [name]  Procedure(s) Performed: CATARACT EXTRACTION PHACO AND INTRAOCULAR LENS PLACEMENT (IOC) RIGHT 19.12 01:27.4 (Right: Eye)  Patient Location: PACU  Anesthesia Type: MAC  Level of Consciousness: awake, alert  and patient cooperative  Airway and Oxygen Therapy: Patient Spontanous Breathing and Patient connected to supplemental oxygen  Post-op Assessment: Post-op Vital signs reviewed, Patient's Cardiovascular Status Stable, Respiratory Function Stable, Patent Airway and No signs of Nausea or vomiting  Post-op Vital Signs: Reviewed and stable  Complications: No notable events documented.

## 2021-07-29 NOTE — Op Note (Signed)
PREOPERATIVE DIAGNOSIS:  Nuclear sclerotic cataract of the right eye.   POSTOPERATIVE DIAGNOSIS:  Cataract   OPERATIVE PROCEDURE:ORPROCALL@   SURGEON:  Birder Robson, MD.   ANESTHESIA:  Anesthesiologist: Elgie Collard, MD CRNA: Silvana Newness, CRNA  1.      Managed anesthesia care. 2.      0.51m of Shugarcaine was instilled in the eye following the paracentesis.   COMPLICATIONS: Vision Blue was used to stain the anterior capsule due to very poor/ no visualization of the red reflex. With significant PXF.    TECHNIQUE:   Stop and chop   DESCRIPTION OF PROCEDURE:  The patient was examined and consented in the preoperative holding area where the aforementioned topical anesthesia was applied to the right eye and then brought back to the Operating Room where the right eye was prepped and draped in the usual sterile ophthalmic fashion and a lid speculum was placed. A paracentesis was created with the side port blade and the anterior chamber was filled with viscoelastic. A near clear corneal incision was performed with the steel keratome. A continuous curvilinear capsulorrhexis was performed with a cystotome followed by the capsulorrhexis forceps. Hydrodissection and hydrodelineation were carried out with BSS on a blunt cannula. The lens was removed in a stop and chop  technique and the remaining cortical material was removed with the irrigation-aspiration handpiece. The capsular bag was inflated with viscoelastic and the Technis ZCB00  lens was placed in the capsular bag without complication. The remaining viscoelastic was removed from the eye with the irrigation-aspiration handpiece. The wounds were hydrated. The anterior chamber was flushed with BSS and the eye was inflated to physiologic pressure. 0.186mof Vigamox was placed in the anterior chamber. The wounds were found to be water tight. The eye was dressed with Combigan. The patient was given protective glasses to wear throughout the day and  a shield with which to sleep tonight. The patient was also given drops with which to begin a drop regimen today and will follow-up with me in one day. Implant Name Type Inv. Item Serial No. Manufacturer Lot No. LRB No. Used Action  LENS IOL TECNIS EYHANCE 24.0 - [id number] Lens LENS IOL TECNIS EYHANCE 24.0 [id number]  Right 1 Implanted   Procedure(s): CATARACT EXTRACTION PHACO AND INTRAOCULAR LENS PLACEMENT (IOC) RIGHT 19.12 01:27.4 (Right)  Electronically signed: WiBirder Robson/07/2021 10:21 AM

## 2021-07-29 NOTE — Telephone Encounter (Signed)
Patient wanted to know when she could  her prolia injection. Patient is coming on 07/30/2021 at 9:30 for b12, could she get one then.

## 2021-07-29 NOTE — Anesthesia Preprocedure Evaluation (Signed)
Anesthesia Evaluation  Patient identified by MRN, date of birth, ID band Patient awake    Reviewed: Allergy & Precautions, H&P , NPO status , Patient's Chart, lab work & pertinent test results  Airway Mallampati: II  TM Distance: >3 FB Neck ROM: full    Dental no notable dental hx.    Pulmonary COPD, Current Smoker,    Pulmonary exam normal        Cardiovascular hypertension, Normal cardiovascular exam Rhythm:regular Rate:Normal     Neuro/Psych Anxiety negative neurological ROS     GI/Hepatic Medicated,  Endo/Other  negative endocrine ROS  Renal/GU   negative genitourinary   Musculoskeletal   Abdominal   Peds  Hematology   Anesthesia Other Findings   Reproductive/Obstetrics                             Anesthesia Physical Anesthesia Plan  ASA: 2  Anesthesia Plan: MAC   Post-op Pain Management:    Induction:   PONV Risk Score and Plan: 1 and TIVA, Midazolam and Treatment may vary due to age or medical condition  Airway Management Planned:   Additional Equipment:   Intra-op Plan:   Post-operative Plan:   Informed Consent: I have reviewed the patients History and Physical, chart, labs and discussed the procedure including the risks, benefits and alternatives for the proposed anesthesia with the patient or authorized representative who has indicated his/her understanding and acceptance.       Plan Discussed with:   Anesthesia Plan Comments:         Anesthesia Quick Evaluation

## 2021-07-30 ENCOUNTER — Ambulatory Visit (INDEPENDENT_AMBULATORY_CARE_PROVIDER_SITE_OTHER): Payer: PPO

## 2021-07-30 ENCOUNTER — Encounter: Payer: Self-pay | Admitting: Ophthalmology

## 2021-07-30 DIAGNOSIS — E538 Deficiency of other specified B group vitamins: Secondary | ICD-10-CM | POA: Diagnosis not present

## 2021-07-30 MED ORDER — CYANOCOBALAMIN 1000 MCG/ML IJ SOLN
1000.0000 ug | Freq: Once | INTRAMUSCULAR | Status: AC
  Administered 2021-07-30: 1000 ug via INTRAMUSCULAR

## 2021-07-30 NOTE — Progress Notes (Signed)
Patient presented for B 12 injection to left deltoid, patient voiced no concerns nor showed any signs of distress during injection. 

## 2021-08-06 DIAGNOSIS — H2512 Age-related nuclear cataract, left eye: Secondary | ICD-10-CM | POA: Diagnosis not present

## 2021-08-06 NOTE — Discharge Instructions (Signed)

## 2021-08-12 ENCOUNTER — Other Ambulatory Visit: Payer: Self-pay

## 2021-08-12 ENCOUNTER — Encounter: Payer: Self-pay | Admitting: Ophthalmology

## 2021-08-12 ENCOUNTER — Ambulatory Visit: Payer: PPO | Admitting: Anesthesiology

## 2021-08-12 ENCOUNTER — Ambulatory Visit
Admission: RE | Admit: 2021-08-12 | Discharge: 2021-08-12 | Disposition: A | Discharge status: Home / Self Care | Payer: PPO | Source: Ambulatory Visit | Attending: Ophthalmology | Admitting: Ophthalmology

## 2021-08-12 ENCOUNTER — Encounter
Admission: RE | Disposition: A | Discharge status: Home / Self Care | Payer: Self-pay | Source: Ambulatory Visit | Attending: Ophthalmology

## 2021-08-12 DIAGNOSIS — F419 Anxiety disorder, unspecified: Secondary | ICD-10-CM | POA: Insufficient documentation

## 2021-08-12 DIAGNOSIS — I10 Essential (primary) hypertension: Secondary | ICD-10-CM | POA: Diagnosis not present

## 2021-08-12 DIAGNOSIS — J449 Chronic obstructive pulmonary disease, unspecified: Secondary | ICD-10-CM | POA: Insufficient documentation

## 2021-08-12 DIAGNOSIS — K219 Gastro-esophageal reflux disease without esophagitis: Secondary | ICD-10-CM | POA: Diagnosis not present

## 2021-08-12 DIAGNOSIS — H2512 Age-related nuclear cataract, left eye: Secondary | ICD-10-CM | POA: Diagnosis not present

## 2021-08-12 DIAGNOSIS — F17200 Nicotine dependence, unspecified, uncomplicated: Secondary | ICD-10-CM | POA: Insufficient documentation

## 2021-08-12 DIAGNOSIS — H25812 Combined forms of age-related cataract, left eye: Secondary | ICD-10-CM | POA: Diagnosis not present

## 2021-08-12 HISTORY — PX: CATARACT EXTRACTION W/PHACO: SHX586

## 2021-08-12 SURGERY — PHACOEMULSIFICATION, CATARACT, WITH IOL INSERTION
Anesthesia: Monitor Anesthesia Care | Site: Eye | Laterality: Left

## 2021-08-12 MED ORDER — ARMC OPHTHALMIC DILATING DROPS
1.0000 "application " | OPHTHALMIC | Status: DC | PRN
  Administered 2021-08-12 (×3): 1 via OPHTHALMIC

## 2021-08-12 MED ORDER — SIGHTPATH DOSE#1 NA CHONDROIT SULF-NA HYALURON 40-17 MG/ML IO SOLN
INTRAOCULAR | Status: DC | PRN
  Administered 2021-08-12: 1 mL via INTRAOCULAR

## 2021-08-12 MED ORDER — MOXIFLOXACIN HCL 0.5 % OP SOLN
OPHTHALMIC | Status: DC | PRN
  Administered 2021-08-12: 0.2 mL via OPHTHALMIC

## 2021-08-12 MED ORDER — SIGHTPATH DOSE#1 BSS IO SOLN
INTRAOCULAR | Status: DC | PRN
  Administered 2021-08-12: 1 mL via INTRAMUSCULAR

## 2021-08-12 MED ORDER — BRIMONIDINE TARTRATE-TIMOLOL 0.2-0.5 % OP SOLN
OPHTHALMIC | Status: DC | PRN
  Administered 2021-08-12: 1 [drp] via OPHTHALMIC

## 2021-08-12 MED ORDER — SIGHTPATH DOSE#1 BSS IO SOLN
INTRAOCULAR | Status: DC | PRN
  Administered 2021-08-12: 59 mL via OPHTHALMIC

## 2021-08-12 MED ORDER — ACETAMINOPHEN 160 MG/5ML PO SOLN: 325.0000 mg | Freq: Once | ORAL | Status: DC

## 2021-08-12 MED ORDER — ACETAMINOPHEN 325 MG PO TABS: 325.0000 mg | ORAL_TABLET | Freq: Once | ORAL | Status: DC

## 2021-08-12 MED ORDER — TETRACAINE HCL 0.5 % OP SOLN
1.0000 [drp] | OPHTHALMIC | Status: DC | PRN
  Administered 2021-08-12 (×3): 1 [drp] via OPHTHALMIC

## 2021-08-12 MED ORDER — FENTANYL CITRATE (PF) 100 MCG/2ML IJ SOLN
INTRAMUSCULAR | Status: DC | PRN
  Administered 2021-08-12 (×2): 50 ug via INTRAVENOUS

## 2021-08-12 MED ORDER — MIDAZOLAM HCL 2 MG/2ML IJ SOLN
INTRAMUSCULAR | Status: DC | PRN
  Administered 2021-08-12: 2 mg via INTRAVENOUS

## 2021-08-12 MED ORDER — LACTATED RINGERS IV SOLN: INTRAVENOUS | Status: DC

## 2021-08-12 MED ORDER — SIGHTPATH DOSE#1 BSS IO SOLN
INTRAOCULAR | Status: DC | PRN
  Administered 2021-08-12: 15 mL

## 2021-08-12 SURGICAL SUPPLY — 10 items
CATARACT SUITE SIGHTPATH (MISCELLANEOUS) ×2 IMPLANT
FEE CATARACT SUITE SIGHTPATH (MISCELLANEOUS) ×1 IMPLANT
GLOVE SURG ENC TEXT LTX SZ8 (GLOVE) ×2 IMPLANT
GLOVE SURG TRIUMPH 8.0 PF LTX (GLOVE) ×2 IMPLANT
LENS IOL TECNIS EYHANCE 23.5 (Intraocular Lens) ×1 IMPLANT
NDL FILTER BLUNT 18X1 1/2 (NEEDLE) ×1 IMPLANT
NEEDLE FILTER BLUNT 18X 1/2SAF (NEEDLE) ×1
NEEDLE FILTER BLUNT 18X1 1/2 (NEEDLE) ×1 IMPLANT
SYR 3ML LL SCALE MARK (SYRINGE) ×2 IMPLANT
WATER STERILE IRR 250ML POUR (IV SOLUTION) ×2 IMPLANT

## 2021-08-12 NOTE — Op Note (Signed)
PREOPERATIVE DIAGNOSIS:  Nuclear sclerotic cataract of the left eye.   POSTOPERATIVE DIAGNOSIS:  Nuclear sclerotic cataract of the left eye.   OPERATIVE PROCEDURE:ORPROCALL@   SURGEON:  Birder Robson, MD.   ANESTHESIA:  Anesthesiologist: Ronelle Nigh, MD CRNA: Dionne Bucy, CRNA  1.      Managed anesthesia care. 2.     0.32m of Shugarcaine was instilled following the paracentesis   COMPLICATIONS:  None.   TECHNIQUE:   Stop and chop   DESCRIPTION OF PROCEDURE:  The patient was examined and consented in the preoperative holding area where the aforementioned topical anesthesia was applied to the left eye and then brought back to the Operating Room where the left eye was prepped and draped in the usual sterile ophthalmic fashion and a lid speculum was placed. A paracentesis was created with the side port blade and the anterior chamber was filled with viscoelastic. A near clear corneal incision was performed with the steel keratome. A continuous curvilinear capsulorrhexis was performed with a cystotome followed by the capsulorrhexis forceps. Hydrodissection and hydrodelineation were carried out with BSS on a blunt cannula. The lens was removed in a stop and chop  technique and the remaining cortical material was removed with the irrigation-aspiration handpiece. The capsular bag was inflated with viscoelastic and the Technis ZCB00 lens was placed in the capsular bag without complication. The remaining viscoelastic was removed from the eye with the irrigation-aspiration handpiece. The wounds were hydrated. The anterior chamber was flushed with BSS and the eye was inflated to physiologic pressure. 0.169mVigamox was placed in the anterior chamber. The wounds were found to be water tight. The eye was dressed with Combigan. The patient was given protective glasses to wear throughout the day and a shield with which to sleep tonight. The patient was also given drops with which to begin a drop regimen  today and will follow-up with me in one day. Implant Name Type Inv. Item Serial No. Manufacturer Lot No. LRB No. Used Action  LENS IOL TECNIS EYHANCE 23.5 - [id number] Lens LENS IOL TECNIS EYHANCE 23.5 [id number]  Left 1 Implanted    Procedure(s): CATARACT EXTRACTION PHACO AND INTRAOCULAR LENS PLACEMENT (IOC) LEFT 13.10 01:05.3 (Left)  Electronically signed: WiBirder Robson/20/2023 10:01 AM

## 2021-08-12 NOTE — Transfer of Care (Signed)
Immediate Anesthesia Transfer of Care Note  Patient: Kathy [name]  Procedure(s) Performed: CATARACT EXTRACTION PHACO AND INTRAOCULAR LENS PLACEMENT (IOC) LEFT 13.10 01:05.3 (Left: Eye)  Patient Location: PACU  Anesthesia Type: MAC  Level of Consciousness: awake, alert  and patient cooperative  Airway and Oxygen Therapy: Patient Spontanous Breathing and Patient connected to supplemental oxygen  Post-op Assessment: Post-op Vital signs reviewed, Patient's Cardiovascular Status Stable, Respiratory Function Stable, Patent Airway and No signs of Nausea or vomiting  Post-op Vital Signs: Reviewed and stable  Complications: No notable events documented.

## 2021-08-12 NOTE — Anesthesia Procedure Notes (Signed)
Procedure Name: MAC Date/Time: 08/12/2021 9:44 AM  Performed by: Dionne Bucy, CRNAPre-anesthesia Checklist: Patient identified, Emergency Drugs available, Suction available, Patient being monitored and Timeout performed Patient Re-evaluated:Patient Re-evaluated prior to induction Oxygen Delivery Method: Nasal cannula Placement Confirmation: positive ETCO2

## 2021-08-12 NOTE — Anesthesia Preprocedure Evaluation (Signed)
Anesthesia Evaluation  Patient identified by MRN, date of birth, ID band Patient awake    Reviewed: Allergy & Precautions, H&P , NPO status , Patient's Chart, lab work & pertinent test results  Airway Mallampati: II  TM Distance: >3 FB Neck ROM: full    Dental  (+) Upper Dentures, Lower Dentures   Pulmonary COPD, Current SmokerPatient did not abstain from smoking.,    Pulmonary exam normal breath sounds clear to auscultation       Cardiovascular hypertension, Normal cardiovascular exam Rhythm:regular Rate:Normal     Neuro/Psych PSYCHIATRIC DISORDERS Anxiety  Neuromuscular disease    GI/Hepatic GERD  ,(+) Hepatitis -  Endo/Other  negative endocrine ROS  Renal/GU   negative genitourinary   Musculoskeletal   Abdominal   Peds  Hematology   Anesthesia Other Findings   Reproductive/Obstetrics                             Anesthesia Physical  Anesthesia Plan  ASA: 3  Anesthesia Plan: MAC   Post-op Pain Management: Minimal or no pain anticipated   Induction:   PONV Risk Score and Plan: 1 and Treatment may vary due to age or medical condition, TIVA and Midazolam  Airway Management Planned:   Additional Equipment:   Intra-op Plan:   Post-operative Plan:   Informed Consent: I have reviewed the patients History and Physical, chart, labs and discussed the procedure including the risks, benefits and alternatives for the proposed anesthesia with the patient or authorized representative who has indicated his/her understanding and acceptance.     Dental Advisory Given  Plan Discussed with: CRNA  Anesthesia Plan Comments:         Anesthesia Quick Evaluation                                  Anesthesia Evaluation  Patient identified by MRN, date of birth, ID band Patient awake    Reviewed: Allergy & Precautions, H&P , NPO status , Patient's Chart, lab work & pertinent test  results  Airway Mallampati: II  TM Distance: >3 FB Neck ROM: full    Dental no notable dental hx.    Pulmonary COPD, Current Smoker,    Pulmonary exam normal        Cardiovascular hypertension, Normal cardiovascular exam Rhythm:regular Rate:Normal     Neuro/Psych Anxiety negative neurological ROS     GI/Hepatic Medicated,  Endo/Other  negative endocrine ROS  Renal/GU   negative genitourinary   Musculoskeletal   Abdominal   Peds  Hematology   Anesthesia Other Findings   Reproductive/Obstetrics                             Anesthesia Physical Anesthesia Plan  ASA: 2  Anesthesia Plan: MAC   Post-op Pain Management:    Induction:   PONV Risk Score and Plan: 1 and TIVA, Midazolam and Treatment may vary due to age or medical condition  Airway Management Planned:   Additional Equipment:   Intra-op Plan:   Post-operative Plan:   Informed Consent: I have reviewed the patients History and Physical, chart, labs and discussed the procedure including the risks, benefits and alternatives for the proposed anesthesia with the patient or authorized representative who has indicated his/her understanding and acceptance.       Plan Discussed with:   Anesthesia Plan  Comments:         Anesthesia Quick Evaluation

## 2021-08-12 NOTE — Anesthesia Postprocedure Evaluation (Signed)
Anesthesia Post Note  Patient: Kathy [name]  Procedure(s) Performed: CATARACT EXTRACTION PHACO AND INTRAOCULAR LENS PLACEMENT (IOC) LEFT 13.10 01:05.3 (Left: Eye)     Patient location during evaluation: PACU Anesthesia Type: MAC Level of consciousness: awake and alert and oriented Pain management: satisfactory to patient Vital Signs Assessment: post-procedure vital signs reviewed and stable Respiratory status: spontaneous breathing, nonlabored ventilation and respiratory function stable Cardiovascular status: blood pressure returned to baseline and stable Postop Assessment: Adequate PO intake and No signs of nausea or vomiting Anesthetic complications: no   No notable events documented.  Raliegh Ip

## 2021-08-12 NOTE — H&P (Signed)
[facility]   Primary Care Physician:  Crecencio Mc, MD Ophthalmologist: Dr. George Ina  Pre-Procedure History & Physical: HPI:  Kathy [name] is a 61 y.o. female here for cataract surgery.   Past Medical History:  Diagnosis Date   Abnormal weight loss    Alcohol abuse    Alcohol abuse with withdrawal ([place]) 10/13/2020   Anxiety    Chicken pox    Colon polyps    COPD (chronic obstructive pulmonary disease) (HCC)    Depression    Dyspnea    Fatty liver    Gallbladder mass 09/09/2020   8 x 6 x 6 mm non mobile  mass along GB wall noted on 2018 [place] 8 x  7 x 5 mm non mobile mass along GB wall noted on July  2022 [place]   GERD (gastroesophageal reflux disease)    Hernia of abdominal cavity    Hip fracture (HCC) right   Neuropathy    lower legs   Wears dentures    full upper and lower    Past Surgical History:  Procedure Laterality Date   CATARACT EXTRACTION W/PHACO Right 07/29/2021   Procedure: CATARACT EXTRACTION PHACO AND INTRAOCULAR LENS PLACEMENT (IOC) RIGHT 19.12 01:27.4;  Surgeon: Birder Robson, MD;  Location: [place];  Service: Ophthalmology;  Laterality: Right;   COLONOSCOPY     ROBOTIC ASSISTED LAPAROSCOPIC CHOLECYSTECTOMY  04/16/2021   [facility]    Prior to Admission medications   Medication Sig Start Date End Date Taking? Authorizing Provider  cyanocobalamin (,VITAMIN B-12,) 1000 MCG/ML injection Inject 1 mL (1,000 mcg total) into the muscle every 14 (fourteen) days. 06/21/19  Yes Crecencio Mc, MD  gabapentin (NEURONTIN) 400 MG capsule Take 400 mg by mouth 3 (three) times daily. 07/23/21 - currently taking every other day   Yes [provider]  omeprazole (PRILOSEC) 40 MG capsule Take 1 capsule (40 mg total) by mouth 2 (two) times daily. 12/11/20  Yes Crecencio Mc, MD  ondansetron (ZOFRAN-ODT) 8 MG disintegrating tablet Take 1 tablet (8 mg total) by mouth every 8 (eight) hours as needed for nausea or vomiting. 05/15/21  Yes Bradler, Vista Lawman, MD  potassium chloride SA (KLOR-CON M) 20 MEQ tablet Take 1 tablet (20 mEq total) by mouth 3 (three) times daily. 05/13/21  Yes Crecencio Mc, MD  denosumab (PROLIA) 60 MG/ML SOSY injection Inject 60 mg into the skin every 6 (six) months.    [provider]  mirtazapine (REMERON) 7.5 MG tablet Take 1 tablet (7.5 mg total) by mouth at bedtime. Patient not taking: Reported on 07/23/2021 05/08/21   Crecencio Mc, MD  Syringe/Needle, Disp, (SYRINGE 3CC/25GX1") 25G X 1" 3 ML MISC Use for b12 injections 06/21/19   Crecencio Mc, MD    Allergies as of 06/24/2021 - Review Complete 06/10/2021  Allergen Reaction Noted   Lyrica [pregabalin] Other (See Comments) 10/17/2016    Family History  Problem Relation Age of Onset   Hypertension Mother    Osteoporosis Mother    Meniere's disease Mother    Cancer Father 18       prostate   Colon cancer Paternal Grandmother    Alcohol abuse Other     Social History   Socioeconomic History   Marital status: Divorced    Spouse name: Not on file   Number of children: Not on file   Years of education: Not on file   Highest education level: Not on file  Occupational History  Not on file  Tobacco Use   Smoking status: Every Day    Packs/day: 0.50    Years: 40.00    Total pack years: 20.00    Types: Cigarettes   Smokeless tobacco: Never   Tobacco comments:    Started smoking age 11.  Quit during pregnancies.  Vaping Use   Vaping Use: Never used  Substance and Sexual Activity   Alcohol use: Yes    Alcohol/week: 42.0 standard drinks of alcohol    Types: 42 Glasses of wine per week    Comment: 07/23/21 - Pt reports "clean" for 5 yrs.  now drinks approx 6 drinks/wk   Drug use: No   Sexual activity: Not Currently  Other Topics Concern   Not on file  Social History Narrative   Not on file   Social Determinants of Health   Financial Resource Strain: Low Risk  (04/19/2020)   Overall Financial Resource Strain (CARDIA)    Difficulty  of Paying Living Expenses: Not hard at all  Food Insecurity: No Food Insecurity (04/19/2020)   Hunger Vital Sign    Worried About Running Out of Food in the Last Year: Never true    Ran Out of Food in the Last Year: Never true  Transportation Needs: No Transportation Needs (04/19/2020)   PRAPARE - Hydrologist (Medical): No    Lack of Transportation (Non-Medical): No  Physical Activity: Insufficiently Active (04/19/2020)   Exercise Vital Sign    Days of Exercise per Week: 3 days    Minutes of Exercise per Session: 30 min  Stress: No Stress Concern Present (04/19/2020)   [place]    Feeling of Stress : Only a little  Social Connections: Unknown (04/19/2020)   Social Connection and Isolation Panel [NHANES]    Frequency of Communication with Friends and Family: More than three times a week    Frequency of Social Gatherings with Friends and Family: More than three times a week    Attends Religious Services: 1 to 4 times per year    Active Member of [employer] or Organizations: Not on file    Attends Archivist Meetings: Not on file    Marital Status: Not on file  Intimate Partner Violence: Not At Risk (04/19/2020)   Humiliation, Afraid, Rape, and Kick questionnaire    Fear of Current or Ex-Partner: No    Emotionally Abused: No    Physically Abused: No    Sexually Abused: No    Review of Systems: See HPI, otherwise negative ROS  Physical Exam: BP 123/77   Pulse 84   Temp (!) 97.5 F (36.4 C) (Temporal)   Wt 45.3 kg   SpO2 99%   BMI 18.27 kg/m  General:   Alert, cooperative in NAD Head:  Normocephalic and atraumatic. Respiratory:  Normal work of breathing. Cardiovascular:  RRR  Impression/Plan: Kathy [name] is here for cataract surgery.  Risks, benefits, limitations, and alternatives regarding cataract surgery have been reviewed with the patient.  Questions have been  answered.  All parties agreeable.   Birder Robson, MD  08/12/2021, 9:37 AM

## 2021-08-13 ENCOUNTER — Encounter: Payer: Self-pay | Admitting: Ophthalmology

## 2021-09-01 ENCOUNTER — Ambulatory Visit (INDEPENDENT_AMBULATORY_CARE_PROVIDER_SITE_OTHER): Payer: PPO

## 2021-09-01 DIAGNOSIS — E538 Deficiency of other specified B group vitamins: Secondary | ICD-10-CM

## 2021-09-01 MED ORDER — CYANOCOBALAMIN 1000 MCG/ML IJ SOLN
1000.0000 ug | Freq: Once | INTRAMUSCULAR | Status: AC
  Administered 2021-09-01: 1000 ug via INTRAMUSCULAR

## 2021-09-01 NOTE — Progress Notes (Signed)
Pt arrived for B12 injection, given in L deltoid. Pt tolerated injection well, showed no signs of distress nor voiced any concerns.  ?

## 2021-09-02 ENCOUNTER — Ambulatory Visit: Payer: PPO

## 2021-09-05 ENCOUNTER — Telehealth: Payer: Self-pay | Admitting: *Deleted

## 2021-09-05 NOTE — Telephone Encounter (Addendum)
Left voicemail for pt to return call to schedule.  Pt has [employer] & Medicaid insurance.  $0 due for Prolia injection.  No Prior Auth required.   Due for Prolia on or after 09/24/21.  Sent mychart message to notify patient. Please schedule

## 2021-09-26 ENCOUNTER — Ambulatory Visit (INDEPENDENT_AMBULATORY_CARE_PROVIDER_SITE_OTHER): Payer: PPO

## 2021-09-26 VITALS — Ht 62.0 in | Wt 99.0 lb

## 2021-09-26 DIAGNOSIS — Z0000 Encounter for general adult medical examination without abnormal findings: Secondary | ICD-10-CM

## 2021-09-26 NOTE — Patient Instructions (Addendum)
  Kathy [name] , Thank you for taking time to come for your Medicare Wellness Visit. I appreciate your ongoing commitment to your health goals. Please review the following plan we discussed and let me know if I can assist you in the future.   These are the goals we discussed:  Goals      I want to keep my stress level down     Do things I enjoy and new things that bring joy         This is a list of the screening recommended for you and due dates:  Health Maintenance  Topic Date Due   Flu Shot  09/23/2021   Urine Protein Check  10/27/2021*   Pap Smear  11/23/2021*   Colon Cancer Screening  11/23/2021*   Tetanus Vaccine  11/23/2021*   Zoster (Shingles) Vaccine (1 of 2) 11/23/2021*   Mammogram  07/02/2023   Hepatitis C Screening: USPSTF Recommendation to screen - Ages 18-79 yo.  Completed   HIV Screening  Completed   HPV Vaccine  Aged Out   COVID-19 Vaccine  Discontinued  *Topic was postponed. The date shown is not the original due date.

## 2021-09-26 NOTE — Progress Notes (Addendum)
Subjective:   Kathy [name] is a 61 y.o. female who presents for Medicare Annual (Subsequent) preventive examination.  Review of Systems    No ROS.  Medicare Wellness Virtual Visit.  Visual/audio telehealth visit, UTA vital signs.   See social history for additional risk factors.   Cardiac Risk Factors include: advanced age (>24mn, >>6women)     Objective:    Today's Vitals   09/26/21 1038  Weight: 99 lb (44.9 kg)  Height: 5' 2"  (1.575 m)   Body mass index is 18.11 kg/m.     09/26/2021   10:58 AM 08/12/2021    8:56 AM 07/29/2021    8:49 AM 05/25/2021   11:53 AM 05/15/2021    9:57 AM 04/16/2021   10:13 AM 04/09/2021    3:19 PM  Advanced Directives  Does Patient Have a Medical Advance Directive? No No No No No No No  Would patient like information on creating a medical advance directive? No - Patient declined No - Patient declined No - Patient declined  No - Patient declined      Current Medications (verified) Outpatient Encounter Medications as of 09/26/2021  Medication Sig   cyanocobalamin (,VITAMIN B-12,) 1000 MCG/ML injection Inject 1 mL (1,000 mcg total) into the muscle every 14 (fourteen) days.   denosumab (PROLIA) 60 MG/ML SOSY injection Inject 60 mg into the skin every 6 (six) months.   gabapentin (NEURONTIN) 400 MG capsule Take 400 mg by mouth 3 (three) times daily. 07/23/21 - currently taking every other day   mirtazapine (REMERON) 7.5 MG tablet Take 1 tablet (7.5 mg total) by mouth at bedtime. (Patient not taking: Reported on 07/23/2021)   omeprazole (PRILOSEC) 40 MG capsule Take 1 capsule (40 mg total) by mouth 2 (two) times daily.   ondansetron (ZOFRAN-ODT) 8 MG disintegrating tablet Take 1 tablet (8 mg total) by mouth every 8 (eight) hours as needed for nausea or vomiting.   potassium chloride SA (KLOR-CON M) 20 MEQ tablet Take 1 tablet (20 mEq total) by mouth 3 (three) times daily.   Syringe/Needle, Disp, (SYRINGE 3CC/25GX1") 25G X 1" 3 ML MISC Use for b12  injections   No facility-administered encounter medications on file as of 09/26/2021.    Allergies (verified) Buspirone and Lyrica [pregabalin]   History: Past Medical History:  Diagnosis Date   Abnormal weight loss    Alcohol abuse    Alcohol abuse with withdrawal (HCC) 10/13/2020   Anxiety    Chicken pox    Colon polyps    COPD (chronic obstructive pulmonary disease) (HCC)    Depression    Dyspnea    Fatty liver    Gallbladder mass 09/09/2020   8 x 6 x 6 mm non mobile  mass along GB wall noted on 2018 [place] x  7 x 5 mm non mobile mass along GB wall noted on July  2022 [place]  GERD (gastroesophageal reflux disease)    Hernia of abdominal cavity    Hip fracture (HCC) right   Neuropathy    lower legs   Wears dentures    full upper and lower   Past Surgical History:  Procedure Laterality Date   CATARACT EXTRACTION W/PHACO Right 07/29/2021   Procedure: CATARACT EXTRACTION PHACO AND INTRAOCULAR LENS PLACEMENT (IOC) RIGHT 19.12 01:27.4;  Surgeon: PBirder Robson MD;  [place]  Service: Ophthalmology;  Laterality: Right;   CATARACT EXTRACTION W/PHACO Left 08/12/2021   Procedure: CATARACT EXTRACTION PHACO AND INTRAOCULAR LENS PLACEMENT (IOC)  LEFT 13.10 01:05.3;  Surgeon: Birder Robson, MD;  Location: [place];  Service: Ophthalmology;  Laterality: Left;   COLONOSCOPY     ROBOTIC ASSISTED LAPAROSCOPIC CHOLECYSTECTOMY  04/16/2021   [facility]   Family History  Problem Relation Age of Onset   Hypertension Mother    Osteoporosis Mother    Meniere's disease Mother    Cancer Father 29       prostate   Colon cancer Paternal Grandmother    Alcohol abuse Other    Social History   Socioeconomic History   Marital status: Divorced    Spouse name: Not on file   Number of children: Not on file   Years of education: Not on file   Highest education level: Not on file  Occupational History   Not on file  Tobacco Use   Smoking status: Every Day     Packs/day: 0.50    Years: 40.00    Total pack years: 20.00    Types: Cigarettes   Smokeless tobacco: Never   Tobacco comments:    Started smoking age 71.  Quit during pregnancies.  Vaping Use   Vaping Use: Never used  Substance and Sexual Activity   Alcohol use: Yes    Alcohol/week: 42.0 standard drinks of alcohol    Types: 42 Glasses of wine per week    Comment: 07/23/21 - Pt reports "clean" for 5 yrs.  now drinks approx 6 drinks/wk   Drug use: No   Sexual activity: Not Currently  Other Topics Concern   Not on file  Social History Narrative   Not on file   Social Determinants of Health   Financial Resource Strain: Low Risk  (09/26/2021)   Overall Financial Resource Strain (CARDIA)    Difficulty of Paying Living Expenses: Not hard at all  Food Insecurity: No Food Insecurity (09/26/2021)   Hunger Vital Sign    Worried About Running Out of Food in the Last Year: Never true    Notus in the Last Year: Never true  Transportation Needs: No Transportation Needs (09/26/2021)   PRAPARE - Hydrologist (Medical): No    Lack of Transportation (Non-Medical): No  Physical Activity: Insufficiently Active (09/26/2021)   Exercise Vital Sign    Days of Exercise per Week: 7 days    Minutes of Exercise per Session: 20 min  Stress: No Stress Concern Present (09/26/2021)   [place]    Feeling of Stress : Only a little  Social Connections: Unknown (09/26/2021)   Social Connection and Isolation Panel [NHANES]    Frequency of Communication with Friends and Family: More than three times a week    Frequency of Social Gatherings with Friends and Family: More than three times a week    Attends Religious Services: 1 to 4 times per year    Active Member of [employer] or Organizations: Not on file    Attends Archivist Meetings: Not on file    Marital Status: Not on file    Tobacco Counseling Ready  to quit: Not Answered Counseling given: Not Answered Tobacco comments: Started smoking age 74.  Quit during pregnancies.   Clinical Intake:  Pre-visit preparation completed: Yes        Diabetes: No  How often do you need to have someone help you when you read instructions, pamphlets, or other written materials from your doctor or pharmacy?: 1 - Never   Interpreter  Needed?: No    Activities of Daily Living    09/26/2021   10:52 AM 08/12/2021    8:51 AM  In your present state of health, do you have any difficulty performing the following activities:  Hearing? 0 0  Vision? 0 0  Difficulty concentrating or making decisions? 0 0  Walking or climbing stairs? 0 0  Dressing or bathing? 0 0  Doing errands, shopping? 0   Preparing Food and eating ? N   Using the Toilet? N   In the past six months, have you accidently leaked urine? N   Do you have problems with loss of bowel control? N   Managing your Medications? N   Managing your Finances? N   Housekeeping or managing your Housekeeping? N    Patient Care Team: Crecencio Mc, MD as PCP - General (Internal Medicine)  Indicate any recent Medical Services you may have received from other than [facility] providers in the past year (date may be approximate).     Assessment:   This is a routine wellness examination for Kathy [name].  Virtual Visit via Telephone Note  I connected with  Kathy [name] on 09/26/21 at 10:30 AM EDT by telephone and verified that I am speaking with the correct person using two identifiers.  Location: Patient: home Provider: office Persons participating in the virtual visit: patient/Nurse Health Advisor   I discussed the limitations of performing an evaluation and management service by telehealth. We continued and completed visit with audio only. Some vital signs may be absent or patient reported.   Hearing/Vision screen Hearing Screening - Comments:: Patient is able to hear conversational tones  without difficulty.  No issues reported.  Vision Screening - Comments:: Followed [facility], Dr. George Ina Wears corrective lenses Cataract extraction, bilateral They have seen their ophthalmologist in the last 12 months.   Dietary issues and exercise activities discussed: Current Exercise Habits: Home exercise routine, Intensity: Mild Regular diet Good water intake   Goals Addressed             This Visit's Progress    I want to keep my stress level down   On track    Do things I enjoy and new things that bring joy        Depression Screen    09/26/2021   10:50 AM 05/28/2021    9:48 AM 02/07/2021   10:41 AM 12/18/2020    4:44 PM 07/18/2020   12:33 PM 07/11/2020    9:17 AM 05/20/2020    3:34 PM  PHQ 2/9 Scores  PHQ - 2 Score 0 2 2 1 3 1  0  PHQ- 9 Score  5 3 4 11 2 2     Fall Risk    09/26/2021   10:52 AM 05/28/2021    8:38 AM 02/07/2021   10:40 AM 12/18/2020    4:44 PM 07/25/2020   12:56 PM  Fall Risk   Falls in the past year? 0 0 0 0 0  Number falls in past yr: 0      Risk for fall due to :  No Fall Risks No Fall Risks No Fall Risks   Follow up Falls evaluation completed Falls evaluation completed Falls evaluation completed Falls evaluation completed Falls evaluation completed   [place]: Home free of loose throw rugs in walkways, pet beds, electrical cords, etc? Yes  Adequate lighting in your home to reduce risk of falls? Yes   ASSISTIVE DEVICES  UTILIZED TO PREVENT FALLS: Life alert? No  Use of a cane, walker or w/c? No  Grab bars in the bathroom? Yes  Shower chair or bench in shower? Yes  Elevated toilet seat or a handicapped toilet? No   TIMED UP AND GO: Was the test performed? No .   Cognitive Function:  Patient is alert and oriented x3.  Enjoys playing cards for brain health stimulation.      Immunizations Immunization History  Administered Date(s) Administered   Hep A / Hep B 10/11/2014, 11/14/2014,  04/22/2015   Influenza-Unspecified 11/24/2018, 12/08/2019   PFIZER(Purple Top)SARS-COV-2 Vaccination 12/04/2019, 12/25/2019   PPD Test 09/16/2015   TDAP status: Due, Education has been provided regarding the importance of this vaccine. Advised may receive this vaccine at local pharmacy or Health Dept. Aware to provide a copy of the vaccination record if obtained from local pharmacy or Health Dept. Verbalized acceptance and understanding.  Shingrix Completed?: No.    Education has been provided regarding the importance of this vaccine. Patient has been advised to call insurance company to determine out of pocket expense if they have not yet received this vaccine. Advised may also receive vaccine at local pharmacy or Health Dept. Verbalized acceptance and understanding.  Screening Tests Health Maintenance  Topic Date Due   INFLUENZA VACCINE  09/23/2021   URINE MICROALBUMIN  10/27/2021 (Originally 12/26/1970)   PAP SMEAR-Modifier  11/23/2021 (Originally 10/15/2019)   COLONOSCOPY (Pts 45-74yr Insurance coverage will need to be confirmed)  11/23/2021 (Originally 01/31/2018)   TETANUS/TDAP  11/23/2021 (Originally 12/25/1980)   Zoster Vaccines- Shingrix (1 of 2) 11/23/2021 (Originally 12/26/2011)   MAMMOGRAM  07/02/2023   Hepatitis C Screening  Completed   HIV Screening  Completed   HPV VACCINES  Aged Out   COVID-19 Vaccine  Discontinued   Health Maintenance Health Maintenance Due  Topic Date Due   INFLUENZA VACCINE  09/23/2021   Colonoscopy- deferred per patient.   Pap smear- deferred per patient. Plans to schedule later, per patient.   DG Chest 2 View- completed 03/22/21   Vision Screening: Recommended annual ophthalmology exams for early detection of glaucoma and other disorders of the eye.  Dental Screening: Recommended annual dental exams for proper oral hygiene  Community Resource Referral / Chronic Care Management: CRR required this visit?  No   CCM required this visit?  No       Plan:   Keep all routine maintenance appointments.   I have personally reviewed and noted the following in the patient's chart:   Medical and social history Use of alcohol, tobacco or illicit drugs  Current medications and supplements including opioid prescriptions.  Functional ability and status Nutritional status Physical activity Advanced directives List of other physicians Hospitalizations, surgeries, and ER visits in previous 12 months Vitals Screenings to include cognitive, depression, and falls Referrals and appointments  In addition, I have reviewed and discussed with patient certain preventive protocols, quality metrics, and best practice recommendations. A written personalized care plan for preventive services as well as general preventive health recommendations were provided to patient.     OBrien-Blaney, Charnita Trudel L, LPN   82/02/3084    I have reviewed the above information and agree with above.   TDeborra Medina MD

## 2021-10-02 ENCOUNTER — Ambulatory Visit: Payer: PPO

## 2021-10-04 ENCOUNTER — Emergency Department
Admission: EM | Admit: 2021-10-04 | Discharge: 2021-10-04 | Disposition: A | Discharge status: Home / Self Care | Payer: PPO | Source: Home / Self Care | Attending: Emergency Medicine | Admitting: Emergency Medicine

## 2021-10-04 ENCOUNTER — Encounter: Payer: Self-pay | Admitting: Emergency Medicine

## 2021-10-04 ENCOUNTER — Emergency Department
Admission: EM | Admit: 2021-10-04 | Discharge: 2021-10-04 | Discharge status: Against Medical Advice | Payer: PPO | Attending: Emergency Medicine | Admitting: Emergency Medicine

## 2021-10-04 ENCOUNTER — Other Ambulatory Visit: Payer: Self-pay

## 2021-10-04 DIAGNOSIS — I10 Essential (primary) hypertension: Secondary | ICD-10-CM | POA: Insufficient documentation

## 2021-10-04 DIAGNOSIS — R1111 Vomiting without nausea: Secondary | ICD-10-CM | POA: Diagnosis not present

## 2021-10-04 DIAGNOSIS — B9689 Other specified bacterial agents as the cause of diseases classified elsewhere: Secondary | ICD-10-CM | POA: Insufficient documentation

## 2021-10-04 DIAGNOSIS — E8729 Other acidosis: Secondary | ICD-10-CM | POA: Diagnosis not present

## 2021-10-04 DIAGNOSIS — L298 Other pruritus: Secondary | ICD-10-CM | POA: Diagnosis not present

## 2021-10-04 DIAGNOSIS — R1013 Epigastric pain: Secondary | ICD-10-CM | POA: Insufficient documentation

## 2021-10-04 DIAGNOSIS — R000 Tachycardia, unspecified: Secondary | ICD-10-CM | POA: Diagnosis not present

## 2021-10-04 DIAGNOSIS — J449 Chronic obstructive pulmonary disease, unspecified: Secondary | ICD-10-CM | POA: Insufficient documentation

## 2021-10-04 DIAGNOSIS — Z5321 Procedure and treatment not carried out due to patient leaving prior to being seen by health care provider: Secondary | ICD-10-CM | POA: Diagnosis not present

## 2021-10-04 DIAGNOSIS — N390 Urinary tract infection, site not specified: Secondary | ICD-10-CM | POA: Insufficient documentation

## 2021-10-04 DIAGNOSIS — E872 Acidosis, unspecified: Secondary | ICD-10-CM | POA: Insufficient documentation

## 2021-10-04 DIAGNOSIS — R0602 Shortness of breath: Secondary | ICD-10-CM | POA: Insufficient documentation

## 2021-10-04 DIAGNOSIS — R110 Nausea: Secondary | ICD-10-CM | POA: Diagnosis not present

## 2021-10-04 DIAGNOSIS — R300 Dysuria: Secondary | ICD-10-CM | POA: Diagnosis not present

## 2021-10-04 LAB — URINALYSIS, ROUTINE W REFLEX MICROSCOPIC
Bilirubin Urine: NEGATIVE
Glucose, UA: NEGATIVE mg/dL
Ketones, ur: 80 mg/dL — AB
Nitrite: NEGATIVE
Protein, ur: 100 mg/dL — AB
Specific Gravity, Urine: 1.016 (ref 1.005–1.030)
pH: 5 (ref 5.0–8.0)

## 2021-10-04 LAB — LIPASE, BLOOD: Lipase: 43 U/L (ref 11–51)

## 2021-10-04 LAB — COMPREHENSIVE METABOLIC PANEL
ALT: 49 U/L — ABNORMAL HIGH (ref 0–44)
AST: 118 U/L — ABNORMAL HIGH (ref 15–41)
Albumin: 5 g/dL (ref 3.5–5.0)
Alkaline Phosphatase: 109 U/L (ref 38–126)
Anion gap: 28 — ABNORMAL HIGH (ref 5–15)
BUN: 9 mg/dL (ref 6–20)
CO2: 12 mmol/L — ABNORMAL LOW (ref 22–32)
Calcium: 9.8 mg/dL (ref 8.9–10.3)
Chloride: 91 mmol/L — ABNORMAL LOW (ref 98–111)
Creatinine, Ser: 0.9 mg/dL (ref 0.44–1.00)
GFR, Estimated: >60 mL/min (ref >60)
Glucose, Bld: 88 mg/dL (ref 70–99)
Potassium: 4.9 mmol/L (ref 3.5–5.1)
Sodium: 131 mmol/L — ABNORMAL LOW (ref 135–145)
Total Bilirubin: 2.4 mg/dL — ABNORMAL HIGH (ref 0.3–1.2)
Total Protein: 9.1 g/dL — ABNORMAL HIGH (ref 6.5–8.1)

## 2021-10-04 LAB — CBC
HCT: 42.7 % (ref 36.0–46.0)
Hemoglobin: 14.1 g/dL (ref 12.0–15.0)
MCH: 37.1 pg — ABNORMAL HIGH (ref 26.0–34.0)
MCHC: 33 g/dL (ref 30.0–36.0)
MCV: 112.4 fL — ABNORMAL HIGH (ref 80.0–100.0)
Platelets: 144 10*3/uL — ABNORMAL LOW (ref 150–400)
RBC: 3.8 MIL/uL — ABNORMAL LOW (ref 3.87–5.11)
RDW: 13.5 % (ref 11.5–15.5)
WBC: 7.3 10*3/uL (ref 4.0–10.5)
nRBC: 0 % (ref 0.0–0.2)

## 2021-10-04 MED ORDER — ONDANSETRON HCL 4 MG/2ML IJ SOLN
4.0000 mg | Freq: Once | INTRAMUSCULAR | Status: AC
  Administered 2021-10-04: 4 mg via INTRAVENOUS
  Filled 2021-10-04: qty 2

## 2021-10-04 MED ORDER — THIAMINE HCL 100 MG/ML IJ SOLN: 100.0000 mg | Freq: Once | INTRAMUSCULAR | Status: DC

## 2021-10-04 MED ORDER — CEPHALEXIN 500 MG PO CAPS
500.0000 mg | ORAL_CAPSULE | Freq: Two times a day (BID) | ORAL | 0 refills | Status: AC
Start: 2021-10-04 — End: 2021-10-09

## 2021-10-04 MED ORDER — CEPHALEXIN 500 MG PO CAPS: 500.0000 mg | ORAL_CAPSULE | Freq: Once | ORAL | Status: DC

## 2021-10-04 MED ORDER — ONDANSETRON 4 MG PO TBDP: 4.0000 mg | ORAL_TABLET | Freq: Three times a day (TID) | ORAL | 0 refills | Status: AC | PRN

## 2021-10-04 MED ORDER — DEXTROSE 5 % AND 0.9 % NACL IV BOLUS
1000.0000 mL | Freq: Once | INTRAVENOUS | Status: AC
Start: 2021-10-04 — End: 2021-10-04
  Administered 2021-10-04: 1000 mL via INTRAVENOUS
  Filled 2021-10-04: qty 1000

## 2021-10-04 MED ORDER — FOLIC ACID 5 MG/ML IJ SOLN
1.0000 mg | Freq: Every day | INTRAMUSCULAR | Status: DC
  Filled 2021-10-04: qty 0.2

## 2021-10-04 NOTE — ED Notes (Signed)
Pt refused medications. States she will take them when she gets home

## 2021-10-04 NOTE — ED Triage Notes (Signed)
Pt reports abd pain that feels like a band around her stomach for several days. Pt reports some nausea and SOB as well. Pt states that her hands are red and she may also have a UTI. Pt reports came last pm but there was too many people here and a long wait and she had left her phone at home so she left. Pt states has lots of issues that need to be fixed before she goes to rehab next week. Pt states last drink was 2 days ago

## 2021-10-04 NOTE — ED Triage Notes (Addendum)
Pt reports came last pm but left because it was so busy. Pt reports feels sob and has some abd discomfort and reports feels like a bad around her epigastric area. Pt reports has a lot of issues and needs to be fixed before she goes to rehab next week. Pt reports last drank alcohol 2 days ago

## 2021-10-04 NOTE — ED Provider Notes (Signed)
[facility] Provider Note    Event Date/Time   First MD Initiated Contact with Patient 10/04/21 1123     (approximate)   History   Abdominal Pain and Pruritis   HPI  Christeen Tiffay Pinette is a 61 y.o. female past medical history of alcohol use disorder, GERD, chronic cholecystitis status postcholecystectomy who presents with nausea and vomiting.  Last 3 days she has had multiple episodes of emesis inability to tolerate p.o.  She has pain bilateral rib cage and upper abdomen.  Also burning with urination denies chest pain shortness of breath fevers chills.  Her last drink was 2 days ago in the afternoon.  Typically drinks daily.  Says she has gotten the shakes before the setting of alcohol withdrawal no history of alcohol withdrawal seizures.     Past Medical History:  Diagnosis Date   Abnormal weight loss    Alcohol abuse    Alcohol abuse with withdrawal ([place]) 10/13/2020   Anxiety    Chicken pox    Colon polyps    COPD (chronic obstructive pulmonary disease) (HCC)    Depression    Dyspnea    Fatty liver    Gallbladder mass 09/09/2020   8 x 6 x 6 mm non mobile  mass along GB wall noted on 2018 [place] 8 x  7 x 5 mm non mobile mass along GB wall noted on July  2022 [place]   GERD (gastroesophageal reflux disease)    Hernia of abdominal cavity    Hip fracture (HCC) right   Neuropathy    lower legs   Wears dentures    full upper and lower    Patient Active Problem List   Diagnosis Date Noted   Hypoglycemia 05/28/2021   Status post laparoscopic cholecystectomy 05/08/2021   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   Abdominal aortic atherosclerosis ([place]) 12/21/2020   Elevated LFTs 10/13/2020   Thrombocytopenia ([place]) 16/11/9602   Alcoholic hepatitis without ascites 09/09/2020   Paraesophageal hiatal hernia 09/09/2020   Constipation 05/21/2020   Mild malnutrition ([place]) 05/20/2020   Mild episode of recurrent major depressive disorder ([place]) 05/20/2020    Essential hypertension 10/03/2019   Insomnia due to anxiety and fear 09/29/2019   GAD (generalized anxiety disorder) 09/19/2018   Poor dentition 05/18/2018   Tobacco abuse 02/22/2018   Atrophy of left kidney 02/22/2018   Diverticulosis of colon without diverticulitis 02/22/2018   MDD (major depressive disorder) 09/13/2017   Osteoporosis 10/17/2016   Pernicious anemia 01/01/2016   Hyponatremia 09/10/2015   [place] 54/10/8117   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   Fatty liver, alcoholic 14/78/2956   Alcohol dependence with alcohol-induced mood disorder ([place])    History of alcohol dependence ([place]) 07/13/2014     Physical Exam  Triage Vital Signs: ED Triage Vitals  Enc Vitals Group     BP 10/04/21 0846 137/89     Pulse Rate 10/04/21 0846 (!) 120     Resp 10/04/21 0846 20     Temp 10/04/21 0846 97.8 F (36.6 C)     Temp Source 10/04/21 0846 Oral     SpO2 10/04/21 0846 100 %     Weight 10/04/21 0844 222 lb 10.6 oz (101 kg)     Height 10/04/21 0844 5' 2"  (1.575 m)     Head Circumference --      Peak Flow --      Pain Score 10/04/21 0843 8     Pain Loc --  Pain Edu? --      Excl. in [place]? --     Most recent vital signs: Vitals:   10/04/21 1300 10/04/21 1435  BP: 131/74 (!) 148/91  Pulse: (!) 110 (!) 110  Resp: 18 18  Temp: 98.3 F (36.8 C) 98.3 F (36.8 C)  SpO2: 100% 100%     General: Awake, no distress.  CV:  Good peripheral perfusion.  Resp:  Normal effort.  Abd:  No distention.  Mild tenderness to palpation in the midepigastric region but abdomen soft Neuro:             Awake, Alert, Oriented x 3  Other:  Patient not tremulous awake alert and oriented, no confusion Erythematous excoriated rash in bilateral upper extremities  ED Results / Procedures / Treatments  Labs (all labs ordered are listed, but only abnormal results are displayed) Labs Reviewed  COMPREHENSIVE METABOLIC PANEL - Abnormal; Notable for the following components:       Result Value   Sodium 131 (*)    Chloride 91 (*)    CO2 12 (*)    Total Protein 9.1 (*)    AST 118 (*)    ALT 49 (*)    Total Bilirubin 2.4 (*)    Anion gap 28 (*)    All other components within normal limits  CBC - Abnormal; Notable for the following components:   RBC 3.80 (*)    MCV 112.4 (*)    MCH 37.1 (*)    Platelets 144 (*)    All other components within normal limits  URINALYSIS, ROUTINE W REFLEX MICROSCOPIC - Abnormal; Notable for the following components:   Color, Urine YELLOW (*)    APPearance HAZY (*)    Hgb urine dipstick SMALL (*)    Ketones, ur 80 (*)    Protein, ur 100 (*)    Leukocytes,Ua MODERATE (*)    Bacteria, UA RARE (*)    All other components within normal limits  URINE CULTURE  LIPASE, BLOOD     EKG  EKG interpretation performed by myself: NSR, nml axis, nml intervals, no acute ischemic changes    RADIOLOGY    PROCEDURES:  Critical Care performed: No  Procedures    MEDICATIONS ORDERED IN ED: Medications  thiamine (VITAMIN B1) injection 100 mg (100 mg Intravenous Not Given 0/16/01 0932)  folic acid injection 1 mg (1 mg Intravenous Not Given 10/04/21 1431)  cephALEXin (KEFLEX) capsule 500 mg (500 mg Oral Not Given 10/04/21 1431)  dextrose 5 % and 0.9% NaCl 5-0.9 % bolus 1,000 mL (0 mLs Intravenous Stopped 10/04/21 1435)  ondansetron (ZOFRAN) injection 4 mg (4 mg Intravenous Given 10/04/21 1300)     IMPRESSION / MDM / ASSESSMENT AND PLAN / ED COURSE  I reviewed the triage vital signs and the nursing notes.                              Patient's presentation is most consistent with acute presentation with potential threat to life or bodily function.  Differential diagnosis includes, but is not limited to, alcoholic ketoacidosis, pancreatitis, gastroenteritis, GERD, gastritis, hepatitis  The patient is 61 year old female with chronic alcohol use disorder presents with nausea and vomiting x3 days.  She has some associated upper  abdominal pain that started after the emesis.  Also with some mild dysuria.  Her vitals are notable for mild tachycardia.  Labs are notable for an anion gap of 28  bicarb of 12.  She has 80 ketones in her urine.  Has had elevated anion gap with low bicarb in the setting of AKI in the past.  She also has mild transaminitis with AST 118 ALT 49.  CBC reassuring normal lipase.  UA with 6-10 WBCs we will send culture as patient is having urinary symptoms we will treat as potential UTI.  Suspect patient has underlying alcoholic ketoacidosis.  I gave her a liter of D5 normal saline Zofran thiamine and folate as well as Keflex for UTI.  Planned to p.o. challenge the patient repeat lab work including a lactate and discharge if acidosis improving.  However patient was feeling better after fluids and Zofran and tolerated p.o. and did not want to wait for repeat labs.  She is seeing her primary doctor in 2 days and felt that she be able to have follow-up at this point.  She has capacity to make this decision ultimately decided to leave [place].  Discussed return to the ED if symptoms are worsening.  Will prescribe for Zofran and Keflex.       FINAL CLINICAL IMPRESSION(S) / ED DIAGNOSES   Final diagnoses:  Alcoholic ketoacidosis  Urinary tract infection without hematuria, site unspecified     Rx / DC Orders   ED Discharge Orders          Ordered    ondansetron (ZOFRAN-ODT) 4 MG disintegrating tablet  Every 8 hours PRN        10/04/21 1437    cephALEXin (KEFLEX) 500 MG capsule  2 times daily        10/04/21 1437             Note:  This document was prepared using Dragon voice recognition software and may include unintentional dictation errors.   Rada Hay, MD 10/04/21 [phone]

## 2021-10-04 NOTE — Discharge Instructions (Signed)
You had metabolic acidosis likely in the setting of using alcohol and not having adequate nutrition.  I recommended that you stay for fluids and a repeat set of blood work but you did not want to stay.  Please make sure you are getting adequate nutrition.  You can take the Zofran as needed for nausea and vomiting.  Also looks like you may have a urinary tract infection.  Please take the antibiotic twice a day for the next 5 days.  If you are not able to eat or drink or any of your symptoms are worsening please return to the emergency department.  Otherwise please follow-up with your primary care doctor in the next 1 to 2 days.

## 2021-10-04 NOTE — ED Notes (Signed)
Pt reports she called her ride, she decided not to be seen. RN encouraged pt to stay reports she forgot her phone and needs to go home because no one knows she is here in hospital. Pt used courtesy phone in waiting room. Declines to be seen reports she is going home

## 2021-10-06 ENCOUNTER — Telehealth: Payer: Self-pay

## 2021-10-06 NOTE — Telephone Encounter (Signed)
LMTCB to try to get patient scheduled for hospital follow-up.

## 2021-10-07 LAB — URINE CULTURE: Culture: >=100000 — AB

## 2021-10-24 ENCOUNTER — Other Ambulatory Visit (HOSPITAL_COMMUNITY)
Admission: RE | Admit: 2021-10-24 | Discharge: 2021-10-24 | Disposition: A | Discharge status: Home / Self Care | Payer: PPO | Source: Ambulatory Visit | Attending: Internal Medicine | Admitting: Internal Medicine

## 2021-10-24 ENCOUNTER — Encounter: Payer: Self-pay | Admitting: Internal Medicine

## 2021-10-24 ENCOUNTER — Ambulatory Visit (INDEPENDENT_AMBULATORY_CARE_PROVIDER_SITE_OTHER): Payer: PPO | Admitting: Internal Medicine

## 2021-10-24 VITALS — BP 110/70 | HR 104 | Temp 97.8°F | Ht 62.0 in | Wt 99.0 lb

## 2021-10-24 DIAGNOSIS — Z01419 Encounter for gynecological examination (general) (routine) without abnormal findings: Secondary | ICD-10-CM | POA: Insufficient documentation

## 2021-10-24 DIAGNOSIS — E785 Hyperlipidemia, unspecified: Secondary | ICD-10-CM

## 2021-10-24 DIAGNOSIS — F330 Major depressive disorder, recurrent, mild: Secondary | ICD-10-CM

## 2021-10-24 DIAGNOSIS — G621 Alcoholic polyneuropathy: Secondary | ICD-10-CM

## 2021-10-24 DIAGNOSIS — Z0000 Encounter for general adult medical examination without abnormal findings: Secondary | ICD-10-CM | POA: Diagnosis not present

## 2021-10-24 DIAGNOSIS — R7303 Prediabetes: Secondary | ICD-10-CM

## 2021-10-24 DIAGNOSIS — D510 Vitamin B12 deficiency anemia due to intrinsic factor deficiency: Secondary | ICD-10-CM | POA: Diagnosis not present

## 2021-10-24 DIAGNOSIS — Z79899 Other long term (current) drug therapy: Secondary | ICD-10-CM

## 2021-10-24 DIAGNOSIS — K7010 Alcoholic hepatitis without ascites: Secondary | ICD-10-CM

## 2021-10-24 DIAGNOSIS — Z1151 Encounter for screening for human papillomavirus (HPV): Secondary | ICD-10-CM | POA: Insufficient documentation

## 2021-10-24 DIAGNOSIS — I10 Essential (primary) hypertension: Secondary | ICD-10-CM | POA: Diagnosis not present

## 2021-10-24 DIAGNOSIS — E538 Deficiency of other specified B group vitamins: Secondary | ICD-10-CM | POA: Diagnosis not present

## 2021-10-24 DIAGNOSIS — Z124 Encounter for screening for malignant neoplasm of cervix: Secondary | ICD-10-CM

## 2021-10-24 DIAGNOSIS — E872 Acidosis, unspecified: Secondary | ICD-10-CM | POA: Diagnosis not present

## 2021-10-24 DIAGNOSIS — F1024 Alcohol dependence with alcohol-induced mood disorder: Secondary | ICD-10-CM | POA: Diagnosis not present

## 2021-10-24 DIAGNOSIS — M810 Age-related osteoporosis without current pathological fracture: Secondary | ICD-10-CM | POA: Diagnosis not present

## 2021-10-24 DIAGNOSIS — K700 Alcoholic fatty liver: Secondary | ICD-10-CM | POA: Diagnosis not present

## 2021-10-24 DIAGNOSIS — E871 Hypo-osmolality and hyponatremia: Secondary | ICD-10-CM

## 2021-10-24 LAB — CBC WITH DIFFERENTIAL/PLATELET
Basophils Absolute: 0.1 10*3/uL (ref 0.0–0.1)
Basophils Relative: 0.9 % (ref 0.0–3.0)
Eosinophils Absolute: 0 10*3/uL (ref 0.0–0.7)
Eosinophils Relative: 0.7 % (ref 0.0–5.0)
HCT: 39.3 % (ref 36.0–46.0)
Hemoglobin: 13.5 g/dL (ref 12.0–15.0)
Lymphocytes Relative: 30.5 % (ref 12.0–46.0)
Lymphs Abs: 2 10*3/uL (ref 0.7–4.0)
MCHC: 34.4 g/dL (ref 30.0–36.0)
MCV: 113.4 fl — ABNORMAL HIGH (ref 78.0–100.0)
Monocytes Absolute: 0.5 10*3/uL (ref 0.1–1.0)
Monocytes Relative: 7 % (ref 3.0–12.0)
Neutro Abs: 3.9 10*3/uL (ref 1.4–7.7)
Neutrophils Relative %: 60.9 % (ref 43.0–77.0)
Platelets: 168 10*3/uL (ref 150.0–400.0)
RBC: 3.47 Mil/uL — ABNORMAL LOW (ref 3.87–5.11)
RDW: 14.3 % (ref 11.5–15.5)
WBC: 6.5 10*3/uL (ref 4.0–10.5)

## 2021-10-24 LAB — COMPREHENSIVE METABOLIC PANEL
ALT: 36 U/L — ABNORMAL HIGH (ref 0–35)
AST: 114 U/L — ABNORMAL HIGH (ref 0–37)
Albumin: 4.3 g/dL (ref 3.5–5.2)
Alkaline Phosphatase: 100 U/L (ref 39–117)
BUN: 8 mg/dL (ref 6–23)
CO2: 25 mEq/L (ref 19–32)
Calcium: 9.7 mg/dL (ref 8.4–10.5)
Chloride: 92 mEq/L — ABNORMAL LOW (ref 96–112)
Creatinine, Ser: 0.53 mg/dL (ref 0.40–1.20)
GFR: 100.94 mL/min (ref >60.00)
Glucose, Bld: 85 mg/dL (ref 70–99)
Potassium: 4 mEq/L (ref 3.5–5.1)
Sodium: 133 mEq/L — ABNORMAL LOW (ref 135–145)
Total Bilirubin: 0.8 mg/dL (ref 0.2–1.2)
Total Protein: 7.5 g/dL (ref 6.0–8.3)

## 2021-10-24 LAB — TSH: TSH: 2.51 u[IU]/mL (ref 0.35–5.50)

## 2021-10-24 MED ORDER — CYANOCOBALAMIN 1000 MCG/ML IJ SOLN
1000.0000 ug | Freq: Once | INTRAMUSCULAR | Status: AC
  Administered 2021-10-24: 1000 ug via INTRAMUSCULAR

## 2021-10-24 MED ORDER — DENOSUMAB 60 MG/ML ~~LOC~~ SOSY
60.0000 mg | PREFILLED_SYRINGE | Freq: Once | SUBCUTANEOUS | Status: AC
  Administered 2021-10-24: 60 mg via SUBCUTANEOUS

## 2021-10-24 MED ORDER — POLY-VI-SOL/IRON 11 MG/ML PO SOLN: 1.0000 mL | Freq: Every day | ORAL | 11 refills | Status: DC
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
## 2021-10-24 NOTE — Patient Instructions (Addendum)
There is no liquid thiamine (B1) supplement, but there is a pediatric multivitamin that is available in liquid form that I have sent

## 2021-10-26 NOTE — Assessment & Plan Note (Signed)
Managed with gabapentin

## 2021-10-26 NOTE — Assessment & Plan Note (Addendum)
Continue monthly  B12 injections   Lab Results  Component Value Date   [id number] 06/21/2019

## 2021-10-26 NOTE — Assessment & Plan Note (Signed)
She has stopped taking remeron.  She declines the start of any new antidepressants and will be entering rehab in 2 weeks.

## 2021-10-26 NOTE — Assessment & Plan Note (Signed)
She is receiving Prolia injections and is due to receive one today

## 2021-10-26 NOTE — Assessment & Plan Note (Signed)
Chronic, aggravated by alcoholism and nausea/vomiting

## 2021-10-26 NOTE — Assessment & Plan Note (Addendum)
Repeat liver enzymes remain elevated because she is still drinking     Lab Results  Component Value Date   ALT 36 (H) 10/24/2021   AST 114 (H) 10/24/2021   ALKPHOS 100 10/24/2021   BILITOT 0.8 10/24/2021

## 2021-10-26 NOTE — Assessment & Plan Note (Signed)

## 2021-10-26 NOTE — Assessment & Plan Note (Signed)
Strongly advised to return to [facility] on a daily basis while she is awaiting inpatient rehab . Tranxene will not be refilled given her recurrent lapses

## 2021-10-31 LAB — CYTOLOGY - PAP
Comment: NEGATIVE
Diagnosis: NEGATIVE
High risk HPV: NEGATIVE

## 2021-11-03 ENCOUNTER — Telehealth: Payer: Self-pay

## 2021-11-03 NOTE — Telephone Encounter (Signed)
LMTCB for PAP results.

## 2021-11-05 NOTE — Telephone Encounter (Signed)
Lvm for pt to return call in regards to pap results.  Per Dr.Tullo: Her PAP smear and HPV screen were normal.  she will need a repeat PAP smear in 3 years

## 2021-11-06 ENCOUNTER — Ambulatory Visit (INDEPENDENT_AMBULATORY_CARE_PROVIDER_SITE_OTHER): Payer: PPO

## 2021-11-06 DIAGNOSIS — E538 Deficiency of other specified B group vitamins: Secondary | ICD-10-CM

## 2021-11-06 MED ORDER — CYANOCOBALAMIN 1000 MCG/ML IJ SOLN
1000.0000 ug | Freq: Once | INTRAMUSCULAR | Status: AC
  Administered 2021-11-06: 1000 ug via INTRAMUSCULAR

## 2021-11-06 NOTE — Progress Notes (Signed)
Pt presented today for b12 injection. Right deltoid, IM. Pt voiced no concerns nor showed any signs of distress during injection.

## 2021-11-11 NOTE — Telephone Encounter (Signed)
noted 

## 2021-11-11 NOTE — Telephone Encounter (Signed)
Patient returned office phone call and note was read.

## 2021-11-25 ENCOUNTER — Other Ambulatory Visit: Payer: Self-pay | Admitting: Internal Medicine

## 2021-11-25 MED ORDER — ONDANSETRON 8 MG PO TBDP: 8.0000 mg | ORAL_TABLET | Freq: Three times a day (TID) | ORAL | 0 refills | Status: DC | PRN

## 2021-11-25 NOTE — Telephone Encounter (Signed)
Pt need a [facility]ill [facility]. Pt stated that the ER gave her the medication and they stated to contact her provider if she needed more

## 2021-11-25 NOTE — Telephone Encounter (Signed)
Historical medication  Last OV: 10/24/2021 Next OV: not scheduled

## 2021-11-26 ENCOUNTER — Ambulatory Visit (INDEPENDENT_AMBULATORY_CARE_PROVIDER_SITE_OTHER): Payer: PPO

## 2021-11-26 DIAGNOSIS — E538 Deficiency of other specified B group vitamins: Secondary | ICD-10-CM | POA: Diagnosis not present

## 2021-11-26 MED ORDER — CYANOCOBALAMIN 1000 MCG/ML IJ SOLN
1000.0000 ug | Freq: Once | INTRAMUSCULAR | Status: AC
  Administered 2021-11-26: 1000 ug via INTRAMUSCULAR

## 2021-11-26 NOTE — Progress Notes (Signed)
Patient arrived for B12 injection given B12 injection in left deltoid Patient tolerated well nor voiced any distress

## 2021-11-27 DIAGNOSIS — H35362 Drusen (degenerative) of macula, left eye: Secondary | ICD-10-CM | POA: Diagnosis not present

## 2021-11-28 ENCOUNTER — Other Ambulatory Visit: Payer: Self-pay | Admitting: Internal Medicine

## 2021-11-28 NOTE — Telephone Encounter (Signed)
Patient called to see if medication below was refilled

## 2021-11-28 NOTE — Telephone Encounter (Signed)
Not in pt's current medication list.   Last OV: 10/24/2021 Next OV: not scheduled

## 2021-12-11 ENCOUNTER — Telehealth: Payer: Self-pay

## 2021-12-11 NOTE — Telephone Encounter (Signed)
Patient states she is experiencing shortness of breath, runny nose, back pain between shoulder blades, cough that strangles her, not hungry, headache over eyebrows.  Patient states she has already been seen at the [facility] and they would like for her to have a chest x-ray.  Patient states [facility] suggested she call her primary care office to see if we are able to give her a chest x-ray.  I let patient know that we will need to connect her with Access Nurse and then she will need to be seen in our office before we can schedule a chest x-ray.  I transferred call to Access Nurse.

## 2021-12-12 ENCOUNTER — Ambulatory Visit (INDEPENDENT_AMBULATORY_CARE_PROVIDER_SITE_OTHER): Payer: PPO

## 2021-12-12 ENCOUNTER — Encounter: Payer: Self-pay | Admitting: Family

## 2021-12-12 ENCOUNTER — Ambulatory Visit (INDEPENDENT_AMBULATORY_CARE_PROVIDER_SITE_OTHER): Payer: PPO | Admitting: Family

## 2021-12-12 VITALS — BP 128/78 | HR 107 | Temp 98.9°F | Ht 62.0 in | Wt 100.0 lb

## 2021-12-12 DIAGNOSIS — J40 Bronchitis, not specified as acute or chronic: Secondary | ICD-10-CM | POA: Insufficient documentation

## 2021-12-12 DIAGNOSIS — R1010 Upper abdominal pain, unspecified: Secondary | ICD-10-CM | POA: Diagnosis not present

## 2021-12-12 DIAGNOSIS — Z1211 Encounter for screening for malignant neoplasm of colon: Secondary | ICD-10-CM

## 2021-12-12 DIAGNOSIS — E538 Deficiency of other specified B group vitamins: Secondary | ICD-10-CM | POA: Diagnosis not present

## 2021-12-12 DIAGNOSIS — R109 Unspecified abdominal pain: Secondary | ICD-10-CM | POA: Insufficient documentation

## 2021-12-12 DIAGNOSIS — K449 Diaphragmatic hernia without obstruction or gangrene: Secondary | ICD-10-CM | POA: Diagnosis not present

## 2021-12-12 DIAGNOSIS — M810 Age-related osteoporosis without current pathological fracture: Secondary | ICD-10-CM

## 2021-12-12 DIAGNOSIS — Z720 Tobacco use: Secondary | ICD-10-CM | POA: Diagnosis not present

## 2021-12-12 DIAGNOSIS — R059 Cough, unspecified: Secondary | ICD-10-CM | POA: Diagnosis not present

## 2021-12-12 MED ORDER — BENZONATATE 100 MG PO CAPS: 100.0000 mg | ORAL_CAPSULE | Freq: Three times a day (TID) | ORAL | 1 refills | Status: DC | PRN

## 2021-12-12 MED ORDER — AMOXICILLIN-POT CLAVULANATE 875-125 MG PO TABS: 1.0000 | ORAL_TABLET | Freq: Two times a day (BID) | ORAL | 0 refills | Status: AC

## 2021-12-12 MED ORDER — BUDESONIDE-FORMOTEROL FUMARATE 80-4.5 MCG/ACT IN AERO: 2.0000 | INHALATION_SPRAY | Freq: Two times a day (BID) | RESPIRATORY_TRACT | 3 refills | Status: DC

## 2021-12-12 MED ORDER — CYANOCOBALAMIN 1000 MCG/ML IJ SOLN
1000.0000 ug | Freq: Once | INTRAMUSCULAR | Status: AC
  Administered 2021-12-12: 1000 ug via INTRAMUSCULAR

## 2021-12-12 NOTE — Patient Instructions (Signed)
Start Augmentin (antibiotic)  Ensure to take probiotics while on antibiotics and also for 2 weeks after completion. This can either be by eating yogurt daily or taking a probiotic supplement over the counter such as Culturelle.It is important to re-colonize the gut with good bacteria and also to prevent any diarrheal infections associated with antibiotic use.   Trial of Symbicort inhaler.  Tessalon Perles have been prescribed as needed for cough   We will call you with results of chest x-ray.   I would like for you to follow-up in regards to your ongoing abdominal pain to discuss in further detail and discuss imaging   Referral to [facility] clinic for colonoscopy.  Have also placed a referral to [facility] pulmonology for lung cancer screening program  Let us know if you dont hear back within a week in regards to an appointment being scheduled.   So that you are aware, if you are [facility] MyChart user , please pay attention to your MyChart messages as you may receive a MyChart message with a phone number to call and schedule this test/appointment own your own from our referral coordinator. This is a new process so I do not want you to miss this message.  If you are not a MyChart user, you will receive a phone call.

## 2021-12-12 NOTE — Assessment & Plan Note (Addendum)
Acute on chronic abdominal pain.  Abdominal pain is not particularly worsened today except for when she is coughing.  Discussed prominence of extrahepatic biliary tree seen on CT. she is compliant with omeprazole 40 mg twice daily.  chronic elevation in liver enzymes, likely related to alcohol use, hepatic steatosis.  Colonoscopy is due and I have placed this referral today.  Patient polilely declines further evaluation including labs, urine abdominal CT for ongoing abdominal pain.  She would like to focus on cough. I strongly advised for her to have dedicated follow up and discussion regarding work up with myself or with her PCP.  She verbalized understanding. Of note: I ordered DEXA due to bone demineralization seen on CT a/p 04/2021

## 2021-12-12 NOTE — Progress Notes (Signed)
Subjective:    Patient ID: Kathy [name], female    DOB: 1960/09/19, 61 y.o.   MRN: [id number]  CC: Kathy [name] is a 61 y.o. female who presents today for follow up.   HPI: Complains of cough x 3 days ago, improved today.  She is concerned for PNA  SOB has resolved since smoking less the past couple of days.  In the past she has used an inhaler.   She has frontal HA at night. No HA today. No vision loss, vision changes. She is concerned for sinus infection. Not worse HA of life.   No antibiotic in the last 3 months.    Endorses right ear pressure, nasal congestion which is clear, facial pain, leg swelling.   Endorses teeth grinding.     No fever, chills.   Covid home test negative.    She hasnt used any medication for this.   No h/o seasonal allergies  She walked  [facility] who advised her to have a chest x-ray and didn't see patient.    Complains of  left and right sided abdominal pain , will radiates shoulder blades when she coughs and is laying down which has been going on for years. This is unchanged.    No chest pain, left arm numbness, constipation nausea, epigastric burning.   She has been lifting heavy objects as she is moving.   She is compliant with omeprazole 73m BID  Abdominal pain is aggravated by coughing. She thinks may worsen after a meal.   Cholecystectomy 03/2021  Last alcohol beverage 2 days ago, one vodka tonic/day.   H/o osteoporosis, HTN, smoking, alcohol abuse, COPD, b12 deficiency  CT chest 05/15/2021 significant for atherosclerosis.  No pulmonary embolus. CT ab/p 04/2021 significant for large hiatal hernia, severe diffuse hepatic steatosis.  Gallbladder is absent.  Prominence of extrahepatic biliary tree.  Large renal cyst, severe left renal atrophy.  Mild diffuse wall thickening likely reflects underdistention.  Diffuse demineralization .  Due colonoscopy    HISTORY:  Past Medical History:   Diagnosis Date   Abnormal weight loss    Alcohol abuse    Alcohol abuse with withdrawal ([facility] 10/13/2020   Anxiety    Chicken pox    Colon polyps    COPD (chronic obstructive pulmonary disease) (HCC)    Depression    Dyspnea    Fatty liver    Gallbladder mass 09/09/2020   8 x 6 x 6 mm non mobile  mass along GB wall noted on 2018 [place] x  7 x 5 mm non mobile mass along GB wall noted on July  2022 [place]  GERD (gastroesophageal reflux disease)    Hernia of abdominal cavity    Hip fracture (HCC) right   Neuropathy    lower legs   Wears dentures    full upper and lower   Past Surgical History:  Procedure Laterality Date   CATARACT EXTRACTION W/PHACO Right 07/29/2021   Procedure: CATARACT EXTRACTION PHACO AND INTRAOCULAR LENS PLACEMENT (IOC) RIGHT 19.12 01:27.4;  Surgeon: PBirder Robson MD;  [place] [facility]  Service: Ophthalmology;  Laterality: Right;   CATARACT EXTRACTION W/PHACO Left 08/12/2021   Procedure: CATARACT EXTRACTION PHACO AND INTRAOCULAR LENS PLACEMENT (IOC) LEFT 13.10 01:05.3;  Surgeon: PBirder Robson MD;  [place] [facility]  Service: Ophthalmology;  Laterality: Left;   COLONOSCOPY     ROBOTIC ASSISTED LAPAROSCOPIC CHOLECYSTECTOMY  04/16/2021   [facility]   Family  History  Problem Relation Age of Onset   Hypertension Mother    Osteoporosis Mother    Meniere's disease Mother    Cancer Father 61       prostate   Colon cancer Paternal Grandmother    Alcohol abuse Other     Allergies: Buspirone and Lyrica [pregabalin] Current Outpatient Medications on File Prior to Visit  Medication Sig Dispense Refill   cyanocobalamin (,VITAMIN B-12,) 1000 MCG/ML injection Inject 1 mL (1,000 mcg total) into the muscle every 14 (fourteen) days. 6 mL 3   cyclobenzaprine (FLEXERIL) 10 MG tablet TAKE 1 TABLET BY MOUTH THREE TIMES DAILYAS NEEDED FOR MUSCLE SPASMS INVOLVING LEGS AND FEET 90 tablet 1   denosumab (PROLIA) 60 MG/ML SOSY injection Inject 60 mg into  the skin every 6 (six) months.     gabapentin (NEURONTIN) 400 MG capsule Take 400 mg by mouth 3 (three) times daily. 07/23/21 - currently taking every other day     omeprazole (PRILOSEC) 40 MG capsule TAKE ONE CAPSULE BY MOUTH TWO TIMES DAILY 180 capsule 1   ondansetron (ZOFRAN-ODT) 8 MG disintegrating tablet Take 1 tablet (8 mg total) by mouth every 8 (eight) hours as needed for nausea or vomiting. 20 tablet 0   pediatric multivitamin + iron (POLY-VI-SOL + IRON) 11 MG/ML SOLN oral solution Take 1 mL by mouth daily. 50 mL 11   Syringe/Needle, Disp, (SYRINGE 3CC/25GX1") 25G X 1" 3 ML MISC Use for b12 injections 50 each 0   No current facility-administered medications on file prior to visit.    Social History   Tobacco Use   Smoking status: Every Day    Packs/day: 0.50    Years: 40.00    Total pack years: 20.00    Types: Cigarettes   Smokeless tobacco: Never   Tobacco comments:    Started smoking age 71.  Quit during pregnancies.  Vaping Use   Vaping Use: Never used  Substance Use Topics   Alcohol use: Yes    Alcohol/week: 42.0 standard drinks of alcohol    Types: 42 Glasses of wine per week    Comment: 07/23/21 - Pt reports "clean" for 5 yrs.  now drinks approx 6 drinks/wk   Drug use: No    Review of Systems  Constitutional:  Negative for chills and fever.  Eyes:  Negative for visual disturbance.  Respiratory:  Positive for cough and shortness of breath.   Cardiovascular:  Negative for chest pain and palpitations.  Gastrointestinal:  Positive for abdominal pain. Negative for constipation, nausea and vomiting.  Genitourinary:  Negative for dysuria.  Neurological:  Positive for headaches.      Objective:    BP 128/78 (BP Location: Left Arm, Patient Position: Sitting, Cuff Size: Normal)   Pulse (!) 107   Temp 98.9 F (37.2 C) (Oral)   Ht 5' 2"  (1.575 m)   Wt 100 lb (45.4 kg)   SpO2 99%   BMI 18.29 kg/m  BP Readings from Last 3 Encounters:  12/12/21 128/78  10/24/21  110/70  10/04/21 (!) 148/91   Wt Readings from Last 3 Encounters:  12/12/21 100 lb (45.4 kg)  10/24/21 99 lb (44.9 kg)  10/04/21 101 lb (45.8 kg)    Physical Exam Vitals reviewed.  Constitutional:      Appearance: Normal appearance. She is well-developed.  HENT:     Head: Normocephalic and atraumatic.     Right Ear: Hearing, tympanic membrane, ear canal and external ear normal. No decreased hearing noted. No drainage,  swelling or tenderness. No middle ear effusion. No foreign body. Tympanic membrane is not erythematous or bulging.     Left Ear: Hearing, tympanic membrane, ear canal and external ear normal. No decreased hearing noted. No drainage, swelling or tenderness.  No middle ear effusion. No foreign body. Tympanic membrane is not erythematous or bulging.     Nose: Nose normal. No rhinorrhea.     Right Sinus: No maxillary sinus tenderness or frontal sinus tenderness.     Left Sinus: No maxillary sinus tenderness or frontal sinus tenderness.     Mouth/Throat:     Pharynx: Uvula midline. No oropharyngeal exudate or posterior oropharyngeal erythema.     Tonsils: No tonsillar abscesses.  Eyes:     Conjunctiva/sclera: Conjunctivae normal.  Cardiovascular:     Rate and Rhythm: Normal rate and regular rhythm.     Pulses: Normal pulses.     Heart sounds: Normal heart sounds.  Pulmonary:     Effort: Pulmonary effort is normal.     Breath sounds: Normal breath sounds. No wheezing, rhonchi or rales.  Abdominal:     General: Bowel sounds are normal. There is no distension.     Palpations: Abdomen is soft. Abdomen is not rigid. There is no fluid wave or mass.     Tenderness: There is no abdominal tenderness. There is no guarding or rebound.       Comments: Patient describes diffuse abdominal discomfort as marked on diagram.  No tenderness on exam.  Lymphadenopathy:     Head:     Right side of head: No submental, submandibular, tonsillar, preauricular, posterior auricular or occipital  adenopathy.     Left side of head: No submental, submandibular, tonsillar, preauricular, posterior auricular or occipital adenopathy.     Cervical: No cervical adenopathy.  Skin:    General: Skin is warm and dry.  Neurological:     Mental Status: She is alert.     Sensory: No sensory deficit.     Deep Tendon Reflexes:     Reflex Scores:      Patellar reflexes are 2+ on the right side and 2+ on the left side.    Comments: Sensation and strength intact bilateral lower extremities.  Psychiatric:        Speech: Speech normal.        Behavior: Behavior normal.        Thought Content: Thought content normal.        Assessment & Plan:   Problem List Items Addressed This Visit       Respiratory   Bronchitis - Primary    No acute respiratory distress today.  Patient is afebrile.  Longstanding history of smoking.  Pending chest x-ray.  Based on history of smoking, I have opted to start Augmentin ahead of chest x-ray for bacterial URI.  I have also provided her with Symbicort.  Referral to [facility] pulmonology lung cancer screening program.  Close follow-up      Relevant Medications   budesonide-formoterol (SYMBICORT) 80-4.5 MCG/ACT inhaler   benzonatate (TESSALON) 100 MG capsule   amoxicillin-clavulanate (AUGMENTIN) 875-125 MG tablet   Other Relevant Orders   DG Chest 2 View     Other   Abdominal pain    Acute on chronic abdominal pain.  Abdominal pain is not particularly worsened today except for when she is coughing.  Discussed prominence of extrahepatic biliary tree seen on CT. she is compliant with omeprazole 40 mg twice daily.  chronic elevation in liver enzymes, likely  related to alcohol use, hepatic steatosis.  Colonoscopy is due and I have placed this referral today.  Patient polilely declines further evaluation including labs, urine abdominal CT for ongoing abdominal pain.  She would like to focus on cough. I strongly advised for her to have dedicated follow up and discussion  regarding work up with myself or with her PCP.  She verbalized understanding.      Relevant Orders   DG Bone Density   Tobacco abuse   Relevant Orders   Ambulatory Referral Lung Cancer Screening [facility]   Other Visit Diagnoses     Screen for colon cancer       Relevant Orders   Ambulatory referral to Gastroenterology   Vitamin B 12 deficiency       Relevant Medications   cyanocobalamin (VITAMIN B12) injection 1,000 mcg (Completed)        I have discontinued Bertina K. Thurow's potassium chloride SA. I am also having her start on budesonide-formoterol, benzonatate, and amoxicillin-clavulanate. Additionally, I am having her maintain her SYRINGE 3CC/25GX1", cyanocobalamin, denosumab, gabapentin, pediatric multivitamin + iron, omeprazole, ondansetron, and cyclobenzaprine. We administered cyanocobalamin.   Meds ordered this encounter  Medications   budesonide-formoterol (SYMBICORT) 80-4.5 MCG/ACT inhaler    Sig: Inhale 2 puffs into the lungs 2 (two) times daily.    Dispense:  1 each    Refill:  3    Order Specific Question:   Supervising Provider    Answer:   Deborra Medina L [[id number]]   benzonatate (TESSALON) 100 MG capsule    Sig: Take 1 capsule (100 mg total) by mouth 3 (three) times daily as needed for cough.    Dispense:  20 capsule    Refill:  1    Order Specific Question:   Supervising Provider    Answer:   Deborra Medina L [[id number]]   amoxicillin-clavulanate (AUGMENTIN) 875-125 MG tablet    Sig: Take 1 tablet by mouth 2 (two) times daily for 7 days.    Dispense:  14 tablet    Refill:  0    Order Specific Question:   Supervising Provider    Answer:   Deborra Medina L [[id number]]   cyanocobalamin (VITAMIN B12) injection 1,000 mcg    Return precautions given.   Risks, benefits, and alternatives of the medications and treatment plan prescribed today were discussed, and patient expressed understanding.   Education regarding symptom management and diagnosis given to  patient on AVS.  Continue to follow with Crecencio Mc, MD for routine health maintenance.   Pooler and I agreed with plan.   Mable Paris, FNP

## 2021-12-12 NOTE — Assessment & Plan Note (Signed)
No acute respiratory distress today.  Patient is afebrile.  Longstanding history of smoking.  Pending chest x-ray.  Based on history of smoking, I have opted to start Augmentin ahead of chest x-ray for bacterial URI.  I have also provided her with Symbicort.  Referral to [facility] pulmonology lung cancer screening program.  Close follow-up

## 2021-12-12 NOTE — Assessment & Plan Note (Signed)
Due DEXA which I have ordered.

## 2021-12-26 NOTE — Progress Notes (Signed)
LVM message to inform pt that Joycelyn Schmid ordered a bone density scan to look for osteoperosis or osteopenia, she can call [facility] to schedule appt.  Their # is [phone]

## 2022-01-06 ENCOUNTER — Ambulatory Visit: Payer: PPO

## 2022-01-07 ENCOUNTER — Ambulatory Visit (INDEPENDENT_AMBULATORY_CARE_PROVIDER_SITE_OTHER): Payer: PPO

## 2022-01-07 DIAGNOSIS — E538 Deficiency of other specified B group vitamins: Secondary | ICD-10-CM | POA: Diagnosis not present

## 2022-01-07 MED ORDER — CYANOCOBALAMIN 1000 MCG/ML IJ SOLN
1000.0000 ug | Freq: Once | INTRAMUSCULAR | Status: AC
  Administered 2022-01-07: 1000 ug via INTRAMUSCULAR

## 2022-01-07 NOTE — Progress Notes (Signed)
Kathy [name] presents today for injection per MD orders. B12  administered SQ in right Upper Arm. Administration without incident. Patient tolerated well. Michaelah Credeur,cma

## 2022-01-22 ENCOUNTER — Encounter: Payer: Self-pay | Admitting: Family

## 2022-01-28 ENCOUNTER — Telehealth: Payer: Self-pay

## 2022-01-28 NOTE — Telephone Encounter (Signed)
Kathy [name], can you please confirm if patient received Prolia injection on 10/24/21 during her OV?

## 2022-01-28 NOTE — Telephone Encounter (Signed)
I would not have documented it as given if it was not given so I must have been given. There is documentation that shows that it was given at 10:34 in the left arm SQ on 10/24/2021 as well as her B12 injection.

## 2022-01-28 NOTE — Telephone Encounter (Signed)
Patient states she is coming in tomorrow for her B12 and would like to know if she is due for her prolia shot, if so, she will get them both.  I spoke with Cannon Kettle, CMA, and she states, according to patient's chart, she received the prolia shot during her visit on 10/24/2021, so she will not be due for her next one until March, 2024.  I relayed message to patient and patient states we had the prolia shot ready for her on 10/24/2021, but she told us that she was not due since she just had it in June, 2023.  Patient states Kerin Salen, RN, will know.

## 2022-01-29 ENCOUNTER — Ambulatory Visit (INDEPENDENT_AMBULATORY_CARE_PROVIDER_SITE_OTHER): Payer: PPO

## 2022-01-29 DIAGNOSIS — E538 Deficiency of other specified B group vitamins: Secondary | ICD-10-CM | POA: Diagnosis not present

## 2022-01-29 MED ORDER — CYANOCOBALAMIN 1000 MCG/ML IJ SOLN
1000.0000 ug | Freq: Once | INTRAMUSCULAR | Status: AC
  Administered 2022-01-29: 1000 ug via INTRAMUSCULAR

## 2022-01-29 NOTE — Progress Notes (Signed)
Patient presented for B 12 injection to left deltoid, patient voiced no concerns nor showed any signs of distress during injection. 

## 2022-01-29 NOTE — Telephone Encounter (Signed)
Thank you for confirming this. I have called the patient to assure her that she received her Prolia on 10/24/21 & will not be due again until March 2024.

## 2022-01-30 ENCOUNTER — Other Ambulatory Visit: Payer: Self-pay | Admitting: Internal Medicine

## 2022-02-04 ENCOUNTER — Other Ambulatory Visit: Payer: Self-pay | Admitting: Internal Medicine

## 2022-02-04 NOTE — Telephone Encounter (Signed)
Meds D/C'd

## 2022-02-11 ENCOUNTER — Ambulatory Visit (INDEPENDENT_AMBULATORY_CARE_PROVIDER_SITE_OTHER): Payer: PPO

## 2022-02-11 DIAGNOSIS — E538 Deficiency of other specified B group vitamins: Secondary | ICD-10-CM | POA: Diagnosis not present

## 2022-02-11 MED ORDER — CYANOCOBALAMIN 1000 MCG/ML IJ SOLN
1000.0000 ug | Freq: Once | INTRAMUSCULAR | Status: AC
  Administered 2022-02-11: 1000 ug via INTRAMUSCULAR

## 2022-02-11 NOTE — Progress Notes (Signed)
Pt presented for their vitamin B12 injection. Pt was identified through two identifiers. Pt tolerated shot well in their left  deltoid.

## 2022-02-19 DIAGNOSIS — H35362 Drusen (degenerative) of macula, left eye: Secondary | ICD-10-CM | POA: Diagnosis not present

## 2022-03-12 ENCOUNTER — Ambulatory Visit (INDEPENDENT_AMBULATORY_CARE_PROVIDER_SITE_OTHER): Payer: Medicare HMO

## 2022-03-12 DIAGNOSIS — E538 Deficiency of other specified B group vitamins: Secondary | ICD-10-CM

## 2022-03-12 MED ORDER — CYANOCOBALAMIN 1000 MCG/ML IJ SOLN
1000.0000 ug | Freq: Once | INTRAMUSCULAR | Status: AC
  Administered 2022-03-12: 1000 ug via INTRAMUSCULAR

## 2022-03-12 NOTE — Progress Notes (Signed)
Patient presented for B 12 injection to left deltoid, patient voiced no concerns nor showed any signs of distress during injection. 

## 2022-03-20 ENCOUNTER — Other Ambulatory Visit: Payer: Self-pay | Admitting: Internal Medicine

## 2022-03-20 ENCOUNTER — Telehealth: Payer: Self-pay

## 2022-03-20 NOTE — Telephone Encounter (Signed)
Judeen Hammans called from [place] to state patient is experiencing nausea and she has submitted an electronic request for a refill for patient's ondansetron (ZOFRAN-ODT) 8 MG disintegrating tablet.  Judeen Hammans states she wouldn't normally call us, but she wanted to see if we could go ahead and process this because it will help the patient.

## 2022-03-22 ENCOUNTER — Encounter: Payer: Self-pay | Admitting: Emergency Medicine

## 2022-03-22 ENCOUNTER — Other Ambulatory Visit: Payer: Self-pay

## 2022-03-22 ENCOUNTER — Emergency Department: Payer: Medicare HMO

## 2022-03-22 ENCOUNTER — Emergency Department
Admission: EM | Admit: 2022-03-22 | Discharge: 2022-03-22 | Disposition: A | Discharge status: Home / Self Care | Payer: Medicare HMO | Attending: Emergency Medicine | Admitting: Emergency Medicine

## 2022-03-22 DIAGNOSIS — K852 Alcohol induced acute pancreatitis without necrosis or infection: Secondary | ICD-10-CM | POA: Diagnosis not present

## 2022-03-22 DIAGNOSIS — E876 Hypokalemia: Secondary | ICD-10-CM | POA: Diagnosis not present

## 2022-03-22 DIAGNOSIS — Z79899 Other long term (current) drug therapy: Secondary | ICD-10-CM | POA: Diagnosis not present

## 2022-03-22 DIAGNOSIS — Y900 Blood alcohol level of less than 20 mg/100 ml: Secondary | ICD-10-CM | POA: Diagnosis not present

## 2022-03-22 DIAGNOSIS — F10188 Alcohol abuse with other alcohol-induced disorder: Secondary | ICD-10-CM | POA: Insufficient documentation

## 2022-03-22 DIAGNOSIS — R000 Tachycardia, unspecified: Secondary | ICD-10-CM | POA: Diagnosis not present

## 2022-03-22 DIAGNOSIS — I10 Essential (primary) hypertension: Secondary | ICD-10-CM | POA: Insufficient documentation

## 2022-03-22 DIAGNOSIS — K5730 Diverticulosis of large intestine without perforation or abscess without bleeding: Secondary | ICD-10-CM | POA: Diagnosis not present

## 2022-03-22 DIAGNOSIS — R112 Nausea with vomiting, unspecified: Secondary | ICD-10-CM | POA: Diagnosis present

## 2022-03-22 DIAGNOSIS — J449 Chronic obstructive pulmonary disease, unspecified: Secondary | ICD-10-CM | POA: Insufficient documentation

## 2022-03-22 DIAGNOSIS — E871 Hypo-osmolality and hyponatremia: Secondary | ICD-10-CM | POA: Diagnosis not present

## 2022-03-22 DIAGNOSIS — K760 Fatty (change of) liver, not elsewhere classified: Secondary | ICD-10-CM | POA: Diagnosis not present

## 2022-03-22 LAB — URINALYSIS, ROUTINE W REFLEX MICROSCOPIC
Bilirubin Urine: NEGATIVE
Glucose, UA: NEGATIVE mg/dL
Ketones, ur: 80 mg/dL — AB
Nitrite: NEGATIVE
Protein, ur: 100 mg/dL — AB
Specific Gravity, Urine: 1.017 (ref 1.005–1.030)
pH: 6 (ref 5.0–8.0)

## 2022-03-22 LAB — COMPREHENSIVE METABOLIC PANEL
ALT: 42 U/L (ref 0–44)
AST: 92 U/L — ABNORMAL HIGH (ref 15–41)
Albumin: 4.6 g/dL (ref 3.5–5.0)
Alkaline Phosphatase: 80 U/L (ref 38–126)
Anion gap: 15 (ref 5–15)
BUN: 8 mg/dL (ref 6–20)
CO2: 16 mmol/L — ABNORMAL LOW (ref 22–32)
Calcium: 8.8 mg/dL — ABNORMAL LOW (ref 8.9–10.3)
Chloride: 93 mmol/L — ABNORMAL LOW (ref 98–111)
Creatinine, Ser: 0.64 mg/dL (ref 0.44–1.00)
GFR, Estimated: >60 mL/min (ref >60)
Glucose, Bld: 123 mg/dL — ABNORMAL HIGH (ref 70–99)
Potassium: 2.7 mmol/L — CL (ref 3.5–5.1)
Sodium: 124 mmol/L — ABNORMAL LOW (ref 135–145)
Total Bilirubin: 1.7 mg/dL — ABNORMAL HIGH (ref 0.3–1.2)
Total Protein: 8.7 g/dL — ABNORMAL HIGH (ref 6.5–8.1)

## 2022-03-22 LAB — CBC
HCT: 43.9 % (ref 36.0–46.0)
Hemoglobin: 15.2 g/dL — ABNORMAL HIGH (ref 12.0–15.0)
MCH: 35.3 pg — ABNORMAL HIGH (ref 26.0–34.0)
MCHC: 34.6 g/dL (ref 30.0–36.0)
MCV: 102.1 fL — ABNORMAL HIGH (ref 80.0–100.0)
Platelets: 94 10*3/uL — ABNORMAL LOW (ref 150–400)
RBC: 4.3 MIL/uL (ref 3.87–5.11)
RDW: 12.5 % (ref 11.5–15.5)
WBC: 5.9 10*3/uL (ref 4.0–10.5)
nRBC: 0.3 % — ABNORMAL HIGH (ref 0.0–0.2)

## 2022-03-22 LAB — ETHANOL: Alcohol, Ethyl (B): <10 mg/dL (ref <10)

## 2022-03-22 LAB — LIPASE, BLOOD: Lipase: 259 U/L — ABNORMAL HIGH (ref 11–51)

## 2022-03-22 LAB — MAGNESIUM: Magnesium: 1.6 mg/dL — ABNORMAL LOW (ref 1.7–2.4)

## 2022-03-22 MED ORDER — POTASSIUM CHLORIDE 10 MEQ/100ML IV SOLN
10.0000 meq | INTRAVENOUS | Status: DC
  Administered 2022-03-22: 10 meq via INTRAVENOUS

## 2022-03-22 MED ORDER — ONDANSETRON HCL 4 MG/2ML IJ SOLN
4.0000 mg | Freq: Once | INTRAMUSCULAR | Status: AC
  Administered 2022-03-22: 4 mg via INTRAVENOUS
  Filled 2022-03-22: qty 2

## 2022-03-22 MED ORDER — LORAZEPAM 2 MG/ML IJ SOLN
1.0000 mg | Freq: Once | INTRAMUSCULAR | Status: AC
  Administered 2022-03-22: 1 mg via INTRAVENOUS
  Filled 2022-03-22: qty 1

## 2022-03-22 MED ORDER — POTASSIUM CHLORIDE CRYS ER 20 MEQ PO TBCR: 40.0000 meq | EXTENDED_RELEASE_TABLET | Freq: Once | ORAL | Status: DC

## 2022-03-22 MED ORDER — POTASSIUM CHLORIDE 10 MEQ/100ML IV SOLN
10.0000 meq | INTRAVENOUS | Status: DC
  Administered 2022-03-22: 10 meq via INTRAVENOUS
  Filled 2022-03-22: qty 100

## 2022-03-22 MED ORDER — ONDANSETRON 4 MG PO TBDP: 4.0000 mg | ORAL_TABLET | Freq: Three times a day (TID) | ORAL | 0 refills | Status: DC | PRN

## 2022-03-22 MED ORDER — MORPHINE SULFATE (PF) 2 MG/ML IV SOLN
2.0000 mg | Freq: Once | INTRAVENOUS | Status: AC
  Administered 2022-03-22: 2 mg via INTRAVENOUS
  Filled 2022-03-22: qty 1

## 2022-03-22 MED ORDER — IOHEXOL 300 MG/ML  SOLN
100.0000 mL | Freq: Once | INTRAMUSCULAR | Status: AC | PRN
  Administered 2022-03-22: 100 mL via INTRAVENOUS

## 2022-03-22 MED ORDER — LACTATED RINGERS IV BOLUS
500.0000 mL | Freq: Once | INTRAVENOUS | Status: AC
  Administered 2022-03-22: 500 mL via INTRAVENOUS

## 2022-03-22 MED ORDER — MAGNESIUM SULFATE 2 GM/50ML IV SOLN
2.0000 g | Freq: Once | INTRAVENOUS | Status: AC
  Administered 2022-03-22: 2 g via INTRAVENOUS
  Filled 2022-03-22: qty 50

## 2022-03-22 NOTE — ED Notes (Signed)
Pt up to restroom, steady on feet at this time. Little assistance required.

## 2022-03-22 NOTE — ED Triage Notes (Signed)
Pt via POV from home. Pt has multiple complaints including LUQ abd pain, vomiting, cough, L shoulder pain that radiates to her back. States she feels dehydrated and can't keep anything down for the past 2 days. States that the pain started a week ago and the vomiting started 2 days ago. Pt has a hx of cholecystectomy. Denies fevers. Pt is A&Ox4 and NAD

## 2022-03-22 NOTE — ED Notes (Signed)
Patient transported to CT 

## 2022-03-22 NOTE — ED Provider Notes (Signed)
[facility] Provider Note    Event Date/Time   First MD Initiated Contact with Patient 03/22/22 0915     (approximate)   History   Chief Complaint Emesis and Abdominal Pain   HPI  Kathy [name] is a 62 y.o. female with past medical history of hypertension, alcohol abuse, GAD, GERD, and COPD who presents to the ED complaining of nausea and vomiting.  Patient reports that she initially developed pain in the epigastric and left upper quadrant of her abdomen radiating around towards her back about 1 week ago.  Pain has been present constantly since then and she has developed nausea and vomiting over the past 2 days.  This been associated with watery stool and she describes her symptoms as a "stomach bug."  She denies any history of similar symptoms, does admit to ongoing daily alcohol consumption and typically drinks 3-4 vodka tonics per day.  She has not been able to keep down any liquids or solids over the past 2 days and has not been able to drink any alcohol.  She denies any fevers and has not had any dysuria, hematuria, or flank pain.  She denies any cough, chest pain, or shortness of breath.     Physical Exam   Triage Vital Signs: ED Triage Vitals  Enc Vitals Group     BP 03/22/22 0843 (!) 156/101     Pulse Rate 03/22/22 0843 (!) 128     Resp 03/22/22 0843 18     Temp 03/22/22 0843 97.8 F (36.6 C)     Temp Source 03/22/22 0843 Oral     SpO2 03/22/22 0843 100 %     Weight 03/22/22 0843 102 lb (46.3 kg)     Height 03/22/22 0843 '5\' 2"'$  (1.575 m)     Head Circumference --      Peak Flow --      Pain Score 03/22/22 0849 9     Pain Loc --      Pain Edu? --      Excl. in [place]? --     Most recent vital signs: Vitals:   03/22/22 1200 03/22/22 1230  BP: 113/82 127/82  Pulse: (!) 124 (!) 116  Resp: 20 19  Temp:    SpO2: 99% 99%    Constitutional: Alert and oriented. Eyes: Conjunctivae are normal. Head: Atraumatic. Nose: No  congestion/rhinnorhea. Mouth/Throat: Mucous membranes are dry.  Cardiovascular: Tachycardic, regular rhythm. Grossly normal heart sounds.  2+ radial pulses bilaterally. Respiratory: Normal respiratory effort.  No retractions. Lungs CTAB. Gastrointestinal: Soft and tender to palpation in the epigastrium and left upper quadrant with no rebound or guarding. No distention. Musculoskeletal: No lower extremity tenderness nor edema.  Neurologic:  Normal speech and language. No gross focal neurologic deficits are appreciated.  Mild tongue fasciculations and tremors noted.    ED Results / Procedures / Treatments   Labs (all labs ordered are listed, but only abnormal results are displayed) Labs Reviewed  LIPASE, BLOOD - Abnormal; Notable for the following components:      Result Value   Lipase 259 (*)    All other components within normal limits  COMPREHENSIVE METABOLIC PANEL - Abnormal; Notable for the following components:   Sodium 124 (*)    Potassium 2.7 (*)    Chloride 93 (*)    CO2 16 (*)    Glucose, Bld 123 (*)    Calcium 8.8 (*)    Total Protein 8.7 (*)    AST  92 (*)    Total Bilirubin 1.7 (*)    All other components within normal limits  CBC - Abnormal; Notable for the following components:   Hemoglobin 15.2 (*)    MCV 102.1 (*)    MCH 35.3 (*)    Platelets 94 (*)    nRBC 0.3 (*)    All other components within normal limits  URINALYSIS, ROUTINE W REFLEX MICROSCOPIC - Abnormal; Notable for the following components:   Color, Urine YELLOW (*)    APPearance HAZY (*)    Hgb urine dipstick SMALL (*)    Ketones, ur 80 (*)    Protein, ur 100 (*)    Leukocytes,Ua LARGE (*)    Bacteria, UA RARE (*)    All other components within normal limits  MAGNESIUM - Abnormal; Notable for the following components:   Magnesium 1.6 (*)    All other components within normal limits  ETHANOL     EKG  ED ECG REPORT I, Blake Divine, the attending physician, personally viewed and  interpreted this ECG.   Date: 03/22/2022  EKG Time: 8:46  Rate: 127  Rhythm: sinus tachycardia  Axis: Normal  Intervals:none  ST&T Change: None  RADIOLOGY CT abdomen/pelvis reviewed and interpreted by me with no inflammatory changes, focal fluid collections, or dilated bowel loops.  PROCEDURES:  Critical Care performed: Yes, see critical care procedure note(s)  .Critical Care  Performed by: Blake Divine, MD Authorized by: Blake Divine, MD   Critical care provider statement:    Critical care time (minutes):  30   Critical care time was exclusive of:  Separately billable procedures and treating other patients and teaching time   Critical care was necessary to treat or prevent imminent or life-threatening deterioration of the following conditions:  Metabolic crisis   Critical care was time spent personally by me on the following activities:  Development of treatment plan with patient or surrogate, discussions with consultants, evaluation of patient's response to treatment, examination of patient, ordering and review of laboratory studies, ordering and review of radiographic studies, ordering and performing treatments and interventions, pulse oximetry, re-evaluation of patient's condition and review of old charts   I assumed direction of critical care for this patient from another provider in my specialty: no      MEDICATIONS ORDERED IN ED: Medications  potassium chloride SA (KLOR-CON M) CR tablet 40 mEq (40 mEq Oral Not Given 03/22/22 1211)  potassium chloride 10 mEq in 100 mL IVPB (10 mEq Intravenous Not Given 03/22/22 1330)  lactated ringers bolus 500 mL (0 mLs Intravenous Stopped 03/22/22 1034)  morphine (PF) 2 MG/ML injection 2 mg (2 mg Intravenous Given 03/22/22 1001)  ondansetron (ZOFRAN) injection 4 mg (4 mg Intravenous Given 03/22/22 1001)  LORazepam (ATIVAN) injection 1 mg (1 mg Intravenous Given 03/22/22 1052)  iohexol (OMNIPAQUE) 300 MG/ML solution 100 mL (100 mLs  Intravenous Contrast Given 03/22/22 1100)  magnesium sulfate IVPB 2 g 50 mL (0 g Intravenous Stopped 03/22/22 1233)  lactated ringers bolus 500 mL (0 mLs Intravenous Stopped 03/22/22 1327)     IMPRESSION / MDM / [place] / ED COURSE  I reviewed the triage vital signs and the nursing notes.                              62 y.o. female with past medical history of hypertension, alcohol abuse, anxiety, GERD, and COPD who presents to the ED complaining of 1  week of epigastric and left upper quadrant abdominal pain radiating to her back associated with 2 days of nausea, vomiting, and diarrhea.  Patient's presentation is most consistent with acute presentation with potential threat to life or bodily function.  Differential diagnosis includes, but is not limited to, pancreatitis, hepatitis, biliary colic, cholecystitis, alcoholic gastritis, dehydration, electrolyte abnormality, AKI, alcoholic ketoacidosis, UTI.  Patient nontoxic-appearing and in no acute distress, vital signs remarkable for tachycardia.  She appears dehydrated likely due to vomiting and diarrhea, additionally has tenderness to palpation in her epigastrium and left upper quadrant.  We will further assess with CT imaging, lipase elevated at greater than 250 consistent with alcoholic pancreatitis.  Patient also with significant hypokalemia which we will replete, acidosis noted but no anion gap concerning for alcoholic ketoacidosis.  Patient also hyponatremic, no AKI noted.  LFTs are similar to previous, no significant anemia or leukocytosis noted, patient does have some thrombocytopenia which she has dealt with in the past.  Urinalysis concerning for possible infection but patient denies any urinary symptoms and we will hold off on antibiotics for this.  Patient treated symptomatically with IV morphine and Zofran, will hydrate with IV fluids.  CT imaging is negative for acute process, patient reports feeling better following IV  Zofran and fluids.  She was also given a dose of IV Ativan for suspected component of alcohol withdrawal.  Patient now expressing desire to be discharged home, I explained to her that her electrolyte derangements are life-threatening and pancreatitis will worsen if she continues to drink alcohol.  Patient states that she has potassium and magnesium supplementation available at home, is requesting only prescription for Zofran.  She refused supplemental oral potassium here in the ED, did receive 2 IV rounds of potassium in addition to IV magnesium.  Despite explanation of life-threatening nature of these issues, patient expresses understanding and continues to wish to leave the hospital and will sign out [place].  She was counseled to return to the ED at any time for reevaluation, states she has a follow-up appointment with her PCP in 2 days.      FINAL CLINICAL IMPRESSION(S) / ED DIAGNOSES   Final diagnoses:  Alcohol-induced acute pancreatitis without infection or necrosis  Hypokalemia  Hypomagnesemia  Hyponatremia     Rx / DC Orders   ED Discharge Orders          Ordered    ondansetron (ZOFRAN-ODT) 4 MG disintegrating tablet  Every 8 hours PRN        03/22/22 1352             Note:  This document was prepared using Dragon voice recognition software and may include unintentional dictation errors.   Blake Divine, MD 03/22/22 1355

## 2022-03-23 NOTE — Telephone Encounter (Signed)
Checked on the status of this. It looks like this medication has been sent in by Blake Divine, MD

## 2022-03-24 ENCOUNTER — Other Ambulatory Visit: Payer: Self-pay | Admitting: Internal Medicine

## 2022-03-24 ENCOUNTER — Ambulatory Visit: Payer: PPO | Admitting: Family

## 2022-03-24 ENCOUNTER — Telehealth: Payer: Self-pay | Admitting: Internal Medicine

## 2022-03-24 DIAGNOSIS — R300 Dysuria: Secondary | ICD-10-CM

## 2022-03-24 NOTE — Telephone Encounter (Signed)
Pt called in staying that she thinks she has an UTI and would like for Dr. Derrel Nip to put an order in for her to drop off urine. She had an apt today with [facility] @ 10:30 but she called in to cancel.

## 2022-03-24 NOTE — Telephone Encounter (Signed)
Spoke with pt and she stated that she was seen at the ED and they did not say anything about her having a UTI but pt would like to have it checked and also her "kidneys" checked. Pt has been scheduled for 03/26/2022 at 11 am with Dr. Derrel Nip. Pt gave a verbal understanding and repeated appt back to me.

## 2022-03-24 NOTE — Telephone Encounter (Signed)
Is it okay to order? 

## 2022-03-24 NOTE — Addendum Note (Signed)
Addended by: Adair Laundry on: 03/24/2022 05:27 PM   Modules accepted: Orders

## 2022-03-26 ENCOUNTER — Ambulatory Visit (INDEPENDENT_AMBULATORY_CARE_PROVIDER_SITE_OTHER): Payer: Medicare HMO | Admitting: Internal Medicine

## 2022-03-26 ENCOUNTER — Telehealth: Payer: Self-pay

## 2022-03-26 ENCOUNTER — Encounter: Payer: Self-pay | Admitting: Internal Medicine

## 2022-03-26 DIAGNOSIS — K852 Alcohol induced acute pancreatitis without necrosis or infection: Secondary | ICD-10-CM

## 2022-03-26 DIAGNOSIS — R300 Dysuria: Secondary | ICD-10-CM | POA: Diagnosis not present

## 2022-03-26 DIAGNOSIS — F1024 Alcohol dependence with alcohol-induced mood disorder: Secondary | ICD-10-CM | POA: Diagnosis not present

## 2022-03-26 DIAGNOSIS — E538 Deficiency of other specified B group vitamins: Secondary | ICD-10-CM | POA: Diagnosis not present

## 2022-03-26 DIAGNOSIS — K7010 Alcoholic hepatitis without ascites: Secondary | ICD-10-CM | POA: Diagnosis not present

## 2022-03-26 DIAGNOSIS — E876 Hypokalemia: Secondary | ICD-10-CM

## 2022-03-26 DIAGNOSIS — I10 Essential (primary) hypertension: Secondary | ICD-10-CM | POA: Diagnosis not present

## 2022-03-26 DIAGNOSIS — I700 Atherosclerosis of aorta: Secondary | ICD-10-CM

## 2022-03-26 LAB — URINALYSIS, ROUTINE W REFLEX MICROSCOPIC
Hgb urine dipstick: NEGATIVE
Ketones, ur: 15 — AB
Nitrite: NEGATIVE
Specific Gravity, Urine: 1.01 (ref 1.000–1.030)
Total Protein, Urine: NEGATIVE
Urine Glucose: 100 — AB
Urobilinogen, UA: >=8 — AB (ref 0.0–1.0)
pH: 6.5 (ref 5.0–8.0)

## 2022-03-26 MED ORDER — VARENICLINE TARTRATE (STARTER) 0.5 MG X 11 & 1 MG X 42 PO TBPK: ORAL_TABLET | ORAL | 0 refills | Status: DC

## 2022-03-26 MED ORDER — VARENICLINE TARTRATE 1 MG PO TABS: 1.0000 mg | ORAL_TABLET | Freq: Two times a day (BID) | ORAL | 1 refills | Status: DC

## 2022-03-26 MED ORDER — CYANOCOBALAMIN 1000 MCG/ML IJ SOLN
1000.0000 ug | Freq: Once | INTRAMUSCULAR | Status: AC
  Administered 2022-03-26: 1000 ug via INTRAMUSCULAR

## 2022-03-26 MED ORDER — OMEPRAZOLE 40 MG PO CPDR: DELAYED_RELEASE_CAPSULE | ORAL | 1 refills | Status: DC

## 2022-03-26 NOTE — Progress Notes (Signed)
Subjective:  Patient ID: Kathy [name], female    DOB: 1960/04/06  Age: 61 y.o. MRN: [id number]  CC: The primary encounter diagnosis was Hypomagnesemia. Diagnoses of Dysuria, Hypokalemia, Alcohol-induced acute pancreatitis, unspecified complication status, H63 deficiency, Alcohol dependence with alcohol-induced mood disorder ([place]), Essential hypertension, Abdominal aortic atherosclerosis ([place]), and Alcoholic hepatitis without ascites were also pertinent to this visit.   HPI Kathy [name] presents for  Chief Complaint  Patient presents with   Hospitalization Follow-up    ED follow up     Rajanee is a 62 yr old alcoholic who was treated in ER on Jan 28 for Norovirus and URI (per patient).  ER records reviewed .  She presented with N/V and watery stools ,  preceded by a one week history of  epigastric and RUQ pain . She was unable to tolerate food or liquid and required IV fluids.  CT abdomen  was unrevealing .  Lipase was elevated to 259, ETOH level was  <10.  Sodium and mg were low /  She was treated with ativan., zofran and IV fluids  . She was released and has steadily improved Has resumed use of alcohol.  She states that she is gradually reduccing her alcool  2)tobacco abuse she wants to quit smoking   3) alcohol abuse  she continues to drink daily .  She reports daily use of 3 vodka tonics daily  4) Hypertension: patient checks blood pressure twice weekly at home.  Readings have been for the most part < 140/80 at rest . Patient is following a reduce salt diet most days and is taking medications as prescribed  Outpatient Medications Prior to Visit  Medication Sig Dispense Refill   amLODipine (NORVASC) 5 MG tablet TAKE 1 TABLET BY MOUTH TWICE DAILY 180 tablet 1   benzonatate (TESSALON) 100 MG capsule Take 1 capsule (100 mg total) by mouth 3 (three) times daily as needed for cough. 20 capsule 1   budesonide-formoterol (SYMBICORT) 80-4.5 MCG/ACT inhaler Inhale 2 puffs into the  lungs 2 (two) times daily. 1 each 3   cyanocobalamin (,VITAMIN B-12,) 1000 MCG/ML injection Inject 1 mL (1,000 mcg total) into the muscle every 14 (fourteen) days. 6 mL 3   cyclobenzaprine (FLEXERIL) 10 MG tablet TAKE 1 TABLET BY MOUTH THREE TIMES DAILYAS NEEDED FOR MUSCLE SPASMS INVOLVING LEGS AND FEET 90 tablet 1   denosumab (PROLIA) 60 MG/ML SOSY injection Inject 60 mg into the skin every 6 (six) months.     gabapentin (NEURONTIN) 400 MG capsule TAKE TWO CAPSULES BY MOUTH THREE TIMES DAILY 180 capsule 0   ondansetron (ZOFRAN-ODT) 4 MG disintegrating tablet Take 1 tablet (4 mg total) by mouth every 8 (eight) hours as needed for nausea or vomiting. 12 tablet 0   pediatric multivitamin + iron (POLY-VI-SOL + IRON) 11 MG/ML SOLN oral solution Take 1 mL by mouth daily. 50 mL 11   Syringe/Needle, Disp, (SYRINGE 3CC/25GX1") 25G X 1" 3 ML MISC Use for b12 injections 50 each 0   omeprazole (PRILOSEC) 40 MG capsule TAKE ONE CAPSULE BY MOUTH TWO TIMES DAILY 180 capsule 1   No facility-administered medications prior to visit.    Review of Systems;  Patient denies headache, fevers, malaise, unintentional weight loss, skin rash, eye pain, sinus congestion and sinus pain, sore throat, dysphagia,  hemoptysis , cough, dyspnea, wheezing, chest pain, palpitations, orthopnea, edema, abdominal pain, nausea, melena, diarrhea, constipation, flank pain, dysuria, hematuria, urinary  Frequency, nocturia, numbness, tingling, seizures,  Focal weakness,  Loss of consciousness,  Tremor, insomnia, depression, anxiety, and suicidal ideation.      Objective:  BP 116/68   Pulse (!) 118   Temp 97.8 F (36.6 C) (Oral)   Ht '5\' 2"'$  (1.575 m)   Wt 101 lb 3.2 oz (45.9 kg)   SpO2 98%   BMI 18.51 kg/m   BP Readings from Last 3 Encounters:  03/26/22 116/68  03/22/22 127/82  12/12/21 128/78    Wt Readings from Last 3 Encounters:  03/26/22 101 lb 3.2 oz (45.9 kg)  03/22/22 102 lb (46.3 kg)  12/12/21 100 lb (45.4 kg)     Physical Exam  Lab Results  Component Value Date   HGBA1C 4.7 05/28/2021   HGBA1C 5.4 10/03/2018   HGBA1C 5.9 02/21/2018    Lab Results  Component Value Date   CREATININE 0.64 03/22/2022   CREATININE 0.53 10/24/2021   CREATININE 0.90 10/04/2021    Lab Results  Component Value Date   WBC 5.9 03/22/2022   HGB 15.2 (H) 03/22/2022   HCT 43.9 03/22/2022   PLT 94 (L) 03/22/2022   GLUCOSE 123 (H) 03/22/2022   CHOL 187 10/03/2018   TRIG 86 10/03/2018   HDL 95 10/03/2018   LDLDIRECT 119.0 10/11/2014   LDLCALC 75 10/03/2018   ALT 42 03/22/2022   AST 92 (H) 03/22/2022   NA 124 (L) 03/22/2022   K 2.7 (LL) 03/22/2022   CL 93 (L) 03/22/2022   CREATININE 0.64 03/22/2022   BUN 8 03/22/2022   CO2 16 (L) 03/22/2022   TSH 2.51 10/24/2021   HGBA1C 4.7 05/28/2021    CT Abdomen Pelvis W Contrast  Result Date: 03/22/2022 CLINICAL DATA:  62 year old female with acute abdominal pain, vomiting, cough. EXAM: CT ABDOMEN AND PELVIS WITH CONTRAST TECHNIQUE: Multidetector CT imaging of the abdomen and pelvis was performed using the standard protocol following bolus administration of intravenous contrast. RADIATION DOSE REDUCTION: This exam was performed according to the departmental dose-optimization program which includes automated exposure control, adjustment of the mA and/or kV according to patient size and/or use of iterative reconstruction technique. CONTRAST:  182m OMNIPAQUE IOHEXOL 300 MG/ML  SOLN COMPARISON:  CT Abdomen and Pelvis 05/25/2021. FINDINGS: Lower chest: Moderate to large gastric hiatal hernia appears stable. No cardiomegaly, pericardial effusion, pleural effusion. Respiratory motion at the lung bases which seem to remain clear. Hepatobiliary: Geographic hepatic steatosis. Absent gallbladder with chronic biliary ductal enlargement. No ductal filling defect identified. Pancreas: Negative. Spleen: Negative. Adrenals/Urinary Tract: Chronic cystic, dysplastic left kidney is unchanged  since CT Abdomen and Pelvis 03/14/2014. Orthotopic right kidney with compensatory hypertrophy demonstrates normal enhancement and contrast excretion. No right nephrolithiasis or renal inflammation identified. Adrenal glands are within normal limits. Diminutive bladder. Occasional pelvic phleboliths. Stomach/Bowel: Diverticulosis throughout the descending and sigmoid colon, with an indistinct appearance of the sigmoid wall similar to last year (series 2, image 663. No mesenteric inflammation associated. Occasional diverticula at the hepatic flexure. Otherwise negative transverse and right colon. Normal appendix terminating near coronal image 36. Decompressed terminal ileum. No dilated small bowel. Chronic gastric hiatal hernia. Intra-abdominal stomach is negative. Gas-filled duodenum and proximal jejunum. No free air or free fluid. Vascular/Lymphatic: Extensive Aortoiliac calcified atherosclerosis. Major arterial structures in the abdomen and pelvis remain patent. Portal venous system is patent. No lymphadenopathy identified. Reproductive: Negative. Other: No pelvis free fluid. Musculoskeletal: No acute osseous abnormality identified. IMPRESSION: 1. No acute or inflammatory process identified in the abdomen or pelvis. 2. Chronic findings including moderate to large gastric hiatal hernia,  advanced Aortic Atherosclerosis (ICD10-I70.0) , hypertrophied solitary functioning right kidney, diverticulosis of the distal large bowel. Electronically Signed   By: Genevie Ann M.D.   On: 03/22/2022 11:32    Assessment & Plan:  .Hypomagnesemia -     Magnesium  Dysuria -     Urine Culture -     Urinalysis, Routine w reflex microscopic  Hypokalemia -     Comprehensive metabolic panel  Alcohol-induced acute pancreatitis, unspecified complication status -     Lipase  B12 deficiency -     Cyanocobalamin  Alcohol dependence with alcohol-induced mood disorder (HCC) Assessment & Plan: Strongly advised to stop drinking     Essential hypertension Assessment & Plan: Well controlled on current regimen of amlodipine 5 mg bid.  Without side effects.  no changes today.   Abdominal aortic atherosclerosis ([place]) Assessment & Plan: Reviewed findings of prior CT scan today..  Patient has elevated liver enzymes and needs  To schedule repeat labs andOV to discuss   Alcoholic hepatitis without ascites Assessment & Plan: Repeat liver enzymes remain elevated because she is still drinking     Lab Results  Component Value Date   ALT 42 03/22/2022   AST 92 (H) 03/22/2022   ALKPHOS 80 03/22/2022   BILITOT 1.7 (H) 03/22/2022      Other orders -     Varenicline Tartrate (Starter); Follow the directions on the box  Dispense: 1 each; Refill: 0 -     Varenicline Tartrate; Take 1 tablet (1 mg total) by mouth 2 (two) times daily.  Dispense: 90 tablet; Refill: 1 -     Omeprazole; TAKE ONE CAPSULE BY MOUTH TWO TIMES DAILY  Dispense: 180 capsule; Refill: 1     I provided 30 minutes of face-to-face time during this encounter reviewing patient's last visit with me, patient's  most recent   ER visit ,  previous  labs and imaging studies, counseling on currently addressed issues,  and post visit ordering to diagnostics and therapeutics .   Follow-up: Return in about 3 months (around 06/24/2022).   Crecencio Mc, MD

## 2022-03-26 NOTE — Telephone Encounter (Signed)
        Patient  visited [facility] on 03/22/2022  for Emesis  Abdominal Pain.   Telephone encounter attempt :  1st  A HIPAA compliant voice message was left requesting a return call.  Instructed patient to call back at [phone].   [place] Resource Care Guide   [email or web address]  ?? [phone]   Website: [email or web address]  [email or web address]

## 2022-03-26 NOTE — Patient Instructions (Addendum)
Use gatorade and sodium continuing  liquids like chicken broth  to improve your sodium level .  Take the magnesium supplement,  unless it causes diarrhea   Your lipase was elevated ; this does not happen with Norovirus .  It happens with alcohol abuse .  I'm very glad that  you are reducing your alcohol use GRADUALLY   Remember that .Chantix Is a nicotine receptor blocker.  So the correct way to use it is to set a quit date one week from the start of the medication. The dose pack for the first month has a gradual increase over the first week, followed by 3 weeks of the same dose.  I recommend staying on it a minimum of 3 months to give you time to adopt new habits      CONTINUE AMLODIPINE FOR NOW.  STOP IF YOUR READINGS ARE FALLING BELOW 100/60

## 2022-03-27 LAB — URINE CULTURE
MICRO NUMBER:: 14505721
SPECIMEN QUALITY:: ADEQUATE

## 2022-03-28 NOTE — Progress Notes (Incomplete)
Subjective:  Patient ID: Kathy [name], female    DOB: 1960-09-19  Age: 62 y.o. MRN: [id number]  CC: The encounter diagnosis was Dysuria.   HPI Kathy [name] presents for  Chief Complaint  Patient presents with  . Hospitalization Follow-up    ED follow up    Treated in ER on Jan 28 for Norovirus and URI (per patient).  ER records reviewed .  She presented with N/v accompanied by epigastric and RUQ pain    Outpatient Medications Prior to Visit  Medication Sig Dispense Refill  . amLODipine (NORVASC) 5 MG tablet TAKE 1 TABLET BY MOUTH TWICE DAILY 180 tablet 1  . benzonatate (TESSALON) 100 MG capsule Take 1 capsule (100 mg total) by mouth 3 (three) times daily as needed for cough. 20 capsule 1  . budesonide-formoterol (SYMBICORT) 80-4.5 MCG/ACT inhaler Inhale 2 puffs into the lungs 2 (two) times daily. 1 each 3  . cyanocobalamin (,VITAMIN B-12,) 1000 MCG/ML injection Inject 1 mL (1,000 mcg total) into the muscle every 14 (fourteen) days. 6 mL 3  . cyclobenzaprine (FLEXERIL) 10 MG tablet TAKE 1 TABLET BY MOUTH THREE TIMES DAILYAS NEEDED FOR MUSCLE SPASMS INVOLVING LEGS AND FEET 90 tablet 1  . denosumab (PROLIA) 60 MG/ML SOSY injection Inject 60 mg into the skin every 6 (six) months.    . gabapentin (NEURONTIN) 400 MG capsule TAKE TWO CAPSULES BY MOUTH THREE TIMES DAILY 180 capsule 0  . omeprazole (PRILOSEC) 40 MG capsule TAKE ONE CAPSULE BY MOUTH TWO TIMES DAILY 180 capsule 1  . ondansetron (ZOFRAN-ODT) 4 MG disintegrating tablet Take 1 tablet (4 mg total) by mouth every 8 (eight) hours as needed for nausea or vomiting. 12 tablet 0  . pediatric multivitamin + iron (POLY-VI-SOL + IRON) 11 MG/ML SOLN oral solution Take 1 mL by mouth daily. 50 mL 11  . Syringe/Needle, Disp, (SYRINGE 3CC/25GX1") 25G X 1" 3 ML MISC Use for b12 injections 50 each 0   No facility-administered medications prior to visit.    Review of Systems;  Patient denies headache, fevers, malaise,  unintentional weight loss, skin rash, eye pain, sinus congestion and sinus pain, sore throat, dysphagia,  hemoptysis , cough, dyspnea, wheezing, chest pain, palpitations, orthopnea, edema, abdominal pain, nausea, melena, diarrhea, constipation, flank pain, dysuria, hematuria, urinary  Frequency, nocturia, numbness, tingling, seizures,  Focal weakness, Loss of consciousness,  Tremor, insomnia, depression, anxiety, and suicidal ideation.      Objective:  BP 116/68   Pulse (!) 118   Temp 97.8 F (36.6 C) (Oral)   Ht '5\' 2"'$  (1.575 m)   Wt 101 lb 3.2 oz (45.9 kg)   SpO2 98%   BMI 18.51 kg/m   BP Readings from Last 3 Encounters:  03/26/22 116/68  03/22/22 127/82  12/12/21 128/78    Wt Readings from Last 3 Encounters:  03/26/22 101 lb 3.2 oz (45.9 kg)  03/22/22 102 lb (46.3 kg)  12/12/21 100 lb (45.4 kg)    Physical Exam  Lab Results  Component Value Date   HGBA1C 4.7 05/28/2021   HGBA1C 5.4 10/03/2018   HGBA1C 5.9 02/21/2018    Lab Results  Component Value Date   CREATININE 0.64 03/22/2022   CREATININE 0.53 10/24/2021   CREATININE 0.90 10/04/2021    Lab Results  Component Value Date   WBC 5.9 03/22/2022   HGB 15.2 (H) 03/22/2022   HCT 43.9 03/22/2022   PLT 94 (L) 03/22/2022   GLUCOSE 123 (H) 03/22/2022   CHOL 187 10/03/2018  TRIG 86 10/03/2018   HDL 95 10/03/2018   LDLDIRECT 119.0 10/11/2014   LDLCALC 75 10/03/2018   ALT 42 03/22/2022   AST 92 (H) 03/22/2022   NA 124 (L) 03/22/2022   K 2.7 (LL) 03/22/2022   CL 93 (L) 03/22/2022   CREATININE 0.64 03/22/2022   BUN 8 03/22/2022   CO2 16 (L) 03/22/2022   TSH 2.51 10/24/2021   HGBA1C 4.7 05/28/2021    CT Abdomen Pelvis W Contrast  Result Date: 03/22/2022 CLINICAL DATA:  62 year old female with acute abdominal pain, vomiting, cough. EXAM: CT ABDOMEN AND PELVIS WITH CONTRAST TECHNIQUE: Multidetector CT imaging of the abdomen and pelvis was performed using the standard protocol following bolus administration of  intravenous contrast. RADIATION DOSE REDUCTION: This exam was performed according to the departmental dose-optimization program which includes automated exposure control, adjustment of the mA and/or kV according to patient size and/or use of iterative reconstruction technique. CONTRAST:  180m OMNIPAQUE IOHEXOL 300 MG/ML  SOLN COMPARISON:  CT Abdomen and Pelvis 05/25/2021. FINDINGS: Lower chest: Moderate to large gastric hiatal hernia appears stable. No cardiomegaly, pericardial effusion, pleural effusion. Respiratory motion at the lung bases which seem to remain clear. Hepatobiliary: Geographic hepatic steatosis. Absent gallbladder with chronic biliary ductal enlargement. No ductal filling defect identified. Pancreas: Negative. Spleen: Negative. Adrenals/Urinary Tract: Chronic cystic, dysplastic left kidney is unchanged since CT Abdomen and Pelvis 03/14/2014. Orthotopic right kidney with compensatory hypertrophy demonstrates normal enhancement and contrast excretion. No right nephrolithiasis or renal inflammation identified. Adrenal glands are within normal limits. Diminutive bladder. Occasional pelvic phleboliths. Stomach/Bowel: Diverticulosis throughout the descending and sigmoid colon, with an indistinct appearance of the sigmoid wall similar to last year (series 2, image 670. No mesenteric inflammation associated. Occasional diverticula at the hepatic flexure. Otherwise negative transverse and right colon. Normal appendix terminating near coronal image 36. Decompressed terminal ileum. No dilated small bowel. Chronic gastric hiatal hernia. Intra-abdominal stomach is negative. Gas-filled duodenum and proximal jejunum. No free air or free fluid. Vascular/Lymphatic: Extensive Aortoiliac calcified atherosclerosis. Major arterial structures in the abdomen and pelvis remain patent. Portal venous system is patent. No lymphadenopathy identified. Reproductive: Negative. Other: No pelvis free fluid. Musculoskeletal: No  acute osseous abnormality identified. IMPRESSION: 1. No acute or inflammatory process identified in the abdomen or pelvis. 2. Chronic findings including moderate to large gastric hiatal hernia, advanced Aortic Atherosclerosis (ICD10-I70.0) , hypertrophied solitary functioning right kidney, diverticulosis of the distal large bowel. Electronically Signed   By: HGenevie AnnM.D.   On: 03/22/2022 11:32    Assessment & Plan:  .Dysuria -     Urine Culture -     Urinalysis, Routine w reflex microscopic     I provided 30 minutes of face-to-face time during this encounter reviewing patient's last visit with me, patient's  most recent visit with cardiology,  nephrology,  and neurology,  recent surgical and non surgical procedures, previous  labs and imaging studies, counseling on currently addressed issues,  and post visit ordering to diagnostics and therapeutics .   Follow-up: No follow-ups on file.   TCrecencio Mc MD

## 2022-03-29 NOTE — Assessment & Plan Note (Signed)
Repeat liver enzymes remain elevated because she is still drinking     Lab Results  Component Value Date   ALT 42 03/22/2022   AST 92 (H) 03/22/2022   ALKPHOS 80 03/22/2022   BILITOT 1.7 (H) 03/22/2022

## 2022-03-29 NOTE — Assessment & Plan Note (Signed)
Well controlled on current regimen of amlodipine 5 mg bid.  Without side effects.  no changes today.

## 2022-03-29 NOTE — Assessment & Plan Note (Signed)
Reviewed findings of prior CT scan today..  Patient has elevated liver enzymes and needs  To schedule repeat labs andOV to discuss

## 2022-03-29 NOTE — Assessment & Plan Note (Signed)
Strongly advised to stop drinking

## 2022-03-30 ENCOUNTER — Telehealth: Payer: Self-pay

## 2022-03-30 NOTE — Telephone Encounter (Signed)
        Patient  visited [facility] on 03/22/2022  for Emesis  Abdominal Pain.   Telephone encounter attempt :  2nd  A HIPAA compliant voice message was left requesting a return call.  Instructed patient to call back at [phone].   [place] Resource Care Guide   ??[email or web address]  ?? [phone]   Website: [email or web address]  [email or web address]

## 2022-04-02 ENCOUNTER — Telehealth: Payer: Self-pay

## 2022-04-02 NOTE — Telephone Encounter (Signed)
        Patient  visited [facility] on 03/22/2022  for Emesis  Abdominal Pain.   Telephone encounter attempt :  3rd  A HIPAA compliant voice message was left requesting a return call.  Instructed patient to call back at [phone].   [place] Resource Care Guide   ??[email or web address]  ?? [phone]   Website: [email or web address]  [email or web address]

## 2022-04-03 ENCOUNTER — Telehealth: Payer: Self-pay | Admitting: Gastroenterology

## 2022-04-03 NOTE — Telephone Encounter (Signed)
Patient left vm wanting to schedule colonoscopy.

## 2022-04-06 NOTE — Telephone Encounter (Signed)
Patient left vm calling back to schedule colonoscopy.

## 2022-04-06 NOTE — Telephone Encounter (Signed)
Returned patients call to schedule her colonoscopy with Dr. Vicente Males. She had just layed back down.  Requested call back today at 1pm. Last colonoscopy was performed at [facility] 01/31/13 sessile polyp noted.  Thanks, Noel, [place]

## 2022-04-07 NOTE — Telephone Encounter (Signed)
So patient called back again. However, patient is requesting an EGD and colonoscopy.  I have inform her that patient will need an new patient appointment with a provider since she is requesting a EGD as well.  After patient hung up the phone. I went and look into her records that show patient went to [facility] on 06/06/2020.   Also she have cancel 2 times with Dr Vicente Males in 2021.  So what do we need to do for this patient?

## 2022-04-08 ENCOUNTER — Telehealth: Payer: Self-pay | Admitting: Internal Medicine

## 2022-04-08 DIAGNOSIS — R109 Unspecified abdominal pain: Secondary | ICD-10-CM

## 2022-04-08 NOTE — Telephone Encounter (Signed)
Pt called stating she would like to ger her stomach checked out and get a endoscopy and a colonoscopy on the same day because pt is stating something is not right. Pt would like to be called.

## 2022-04-08 NOTE — Telephone Encounter (Signed)
Spoke with and she stated that when she one of our PA's recently they ordered her a colonoscopy but not the endoscopy. Pt would like to have the endoscopy ordered. She is still having trouble with keeping food and liquid down and is very concerned.

## 2022-04-09 ENCOUNTER — Ambulatory Visit: Payer: Medicare HMO

## 2022-04-09 NOTE — Telephone Encounter (Signed)
Per dr Derrel Nip, msg sent to [facility]. Not in office today, will await response

## 2022-04-09 NOTE — Telephone Encounter (Signed)
Pt want to be referred to Dr. Jacqulyn Liner for endo and colon [phone]

## 2022-04-10 ENCOUNTER — Other Ambulatory Visit: Payer: Self-pay

## 2022-04-10 ENCOUNTER — Emergency Department: Payer: Medicare HMO

## 2022-04-10 ENCOUNTER — Encounter: Payer: Self-pay | Admitting: Internal Medicine

## 2022-04-10 ENCOUNTER — Observation Stay
Admission: EM | Admit: 2022-04-10 | Discharge: 2022-04-10 | Disposition: A | Discharge status: Home / Self Care | Payer: Medicare HMO | Attending: Family Medicine | Admitting: Family Medicine

## 2022-04-10 DIAGNOSIS — F17210 Nicotine dependence, cigarettes, uncomplicated: Secondary | ICD-10-CM | POA: Diagnosis not present

## 2022-04-10 DIAGNOSIS — E8729 Other acidosis: Principal | ICD-10-CM | POA: Diagnosis present

## 2022-04-10 DIAGNOSIS — J449 Chronic obstructive pulmonary disease, unspecified: Secondary | ICD-10-CM | POA: Diagnosis not present

## 2022-04-10 DIAGNOSIS — R531 Weakness: Secondary | ICD-10-CM | POA: Diagnosis not present

## 2022-04-10 DIAGNOSIS — I10 Essential (primary) hypertension: Secondary | ICD-10-CM | POA: Diagnosis not present

## 2022-04-10 DIAGNOSIS — K5730 Diverticulosis of large intestine without perforation or abscess without bleeding: Secondary | ICD-10-CM | POA: Diagnosis present

## 2022-04-10 DIAGNOSIS — R000 Tachycardia, unspecified: Secondary | ICD-10-CM | POA: Diagnosis not present

## 2022-04-10 DIAGNOSIS — K852 Alcohol induced acute pancreatitis without necrosis or infection: Secondary | ICD-10-CM | POA: Insufficient documentation

## 2022-04-10 DIAGNOSIS — R1012 Left upper quadrant pain: Secondary | ICD-10-CM

## 2022-04-10 DIAGNOSIS — R748 Abnormal levels of other serum enzymes: Secondary | ICD-10-CM | POA: Insufficient documentation

## 2022-04-10 DIAGNOSIS — Z79899 Other long term (current) drug therapy: Secondary | ICD-10-CM | POA: Diagnosis not present

## 2022-04-10 DIAGNOSIS — R10817 Generalized abdominal tenderness: Secondary | ICD-10-CM | POA: Diagnosis not present

## 2022-04-10 DIAGNOSIS — E871 Hypo-osmolality and hyponatremia: Secondary | ICD-10-CM | POA: Diagnosis not present

## 2022-04-10 DIAGNOSIS — F1090 Alcohol use, unspecified, uncomplicated: Secondary | ICD-10-CM | POA: Insufficient documentation

## 2022-04-10 DIAGNOSIS — R109 Unspecified abdominal pain: Secondary | ICD-10-CM | POA: Diagnosis not present

## 2022-04-10 DIAGNOSIS — R1032 Left lower quadrant pain: Secondary | ICD-10-CM | POA: Diagnosis not present

## 2022-04-10 LAB — URINALYSIS, ROUTINE W REFLEX MICROSCOPIC
Bilirubin Urine: NEGATIVE
Glucose, UA: NEGATIVE mg/dL
Hgb urine dipstick: NEGATIVE
Ketones, ur: 20 mg/dL — AB
Leukocytes,Ua: NEGATIVE
Nitrite: NEGATIVE
Protein, ur: NEGATIVE mg/dL
Specific Gravity, Urine: 1.033 — ABNORMAL HIGH (ref 1.005–1.030)
pH: 5 (ref 5.0–8.0)

## 2022-04-10 LAB — MAGNESIUM
Magnesium: 1.2 mg/dL — ABNORMAL LOW (ref 1.7–2.4)
Magnesium: 2.2 mg/dL (ref 1.7–2.4)

## 2022-04-10 LAB — CBC WITH DIFFERENTIAL/PLATELET
Abs Immature Granulocytes: 0.03 10*3/uL (ref 0.00–0.07)
Basophils Absolute: 0 10*3/uL (ref 0.0–0.1)
Basophils Relative: 0 %
Eosinophils Absolute: 0.1 10*3/uL (ref 0.0–0.5)
Eosinophils Relative: 1 %
HCT: 36.6 % (ref 36.0–46.0)
Hemoglobin: 12.4 g/dL (ref 12.0–15.0)
Immature Granulocytes: 0 %
Lymphocytes Relative: 29 %
Lymphs Abs: 2.1 10*3/uL (ref 0.7–4.0)
MCH: 35.4 pg — ABNORMAL HIGH (ref 26.0–34.0)
MCHC: 33.9 g/dL (ref 30.0–36.0)
MCV: 104.6 fL — ABNORMAL HIGH (ref 80.0–100.0)
Monocytes Absolute: 0.6 10*3/uL (ref 0.1–1.0)
Monocytes Relative: 8 %
Neutro Abs: 4.4 10*3/uL (ref 1.7–7.7)
Neutrophils Relative %: 62 %
Platelets: 169 10*3/uL (ref 150–400)
RBC: 3.5 MIL/uL — ABNORMAL LOW (ref 3.87–5.11)
RDW: 13.3 % (ref 11.5–15.5)
WBC: 7.2 10*3/uL (ref 4.0–10.5)
nRBC: 0 % (ref 0.0–0.2)

## 2022-04-10 LAB — BASIC METABOLIC PANEL
Anion gap: 9 (ref 5–15)
BUN: 6 mg/dL (ref 6–20)
CO2: 20 mmol/L — ABNORMAL LOW (ref 22–32)
Calcium: 8.4 mg/dL — ABNORMAL LOW (ref 8.9–10.3)
Chloride: 100 mmol/L (ref 98–111)
Creatinine, Ser: 0.47 mg/dL (ref 0.44–1.00)
GFR, Estimated: >60 mL/min (ref >60)
Glucose, Bld: 112 mg/dL — ABNORMAL HIGH (ref 70–99)
Potassium: 4.1 mmol/L (ref 3.5–5.1)
Sodium: 129 mmol/L — ABNORMAL LOW (ref 135–145)

## 2022-04-10 LAB — COMPREHENSIVE METABOLIC PANEL
ALT: 29 U/L (ref 0–44)
AST: 72 U/L — ABNORMAL HIGH (ref 15–41)
Albumin: 3.9 g/dL (ref 3.5–5.0)
Alkaline Phosphatase: 85 U/L (ref 38–126)
Anion gap: 17 — ABNORMAL HIGH (ref 5–15)
BUN: 8 mg/dL (ref 6–20)
CO2: 14 mmol/L — ABNORMAL LOW (ref 22–32)
Calcium: 9.1 mg/dL (ref 8.9–10.3)
Chloride: 92 mmol/L — ABNORMAL LOW (ref 98–111)
Creatinine, Ser: 0.72 mg/dL (ref 0.44–1.00)
GFR, Estimated: >60 mL/min (ref >60)
Glucose, Bld: 127 mg/dL — ABNORMAL HIGH (ref 70–99)
Potassium: 3.9 mmol/L (ref 3.5–5.1)
Sodium: 123 mmol/L — ABNORMAL LOW (ref 135–145)
Total Bilirubin: 2.1 mg/dL — ABNORMAL HIGH (ref 0.3–1.2)
Total Protein: 7.3 g/dL (ref 6.5–8.1)

## 2022-04-10 LAB — LIPASE, BLOOD: Lipase: 101 U/L — ABNORMAL HIGH (ref 11–51)

## 2022-04-10 LAB — TROPONIN I (HIGH SENSITIVITY)
Troponin I (High Sensitivity): 6 ng/L (ref <18)
Troponin I (High Sensitivity): 7 ng/L (ref <18)

## 2022-04-10 LAB — ETHANOL: Alcohol, Ethyl (B): <10 mg/dL (ref <10)

## 2022-04-10 LAB — HIV ANTIBODY (ROUTINE TESTING W REFLEX): HIV Screen 4th Generation wRfx: NONREACTIVE

## 2022-04-10 MED ORDER — LACTATED RINGERS IV SOLN: INTRAVENOUS | Status: DC

## 2022-04-10 MED ORDER — AMLODIPINE BESYLATE 5 MG PO TABS
5.0000 mg | ORAL_TABLET | Freq: Two times a day (BID) | ORAL | Status: DC
  Filled 2022-04-10: qty 1

## 2022-04-10 MED ORDER — ENOXAPARIN SODIUM 40 MG/0.4ML IJ SOSY
40.0000 mg | PREFILLED_SYRINGE | INTRAMUSCULAR | Status: DC
  Administered 2022-04-10: 40 mg via SUBCUTANEOUS
  Filled 2022-04-10: qty 0.4

## 2022-04-10 MED ORDER — MORPHINE SULFATE (PF) 2 MG/ML IV SOLN
2.0000 mg | INTRAVENOUS | Status: DC | PRN
  Administered 2022-04-10: 2 mg via INTRAVENOUS
  Filled 2022-04-10: qty 1

## 2022-04-10 MED ORDER — ACETAMINOPHEN 325 MG PO TABS: 650.0000 mg | ORAL_TABLET | Freq: Four times a day (QID) | ORAL | Status: DC | PRN

## 2022-04-10 MED ORDER — SODIUM CHLORIDE 0.9 % IV BOLUS
1000.0000 mL | Freq: Once | INTRAVENOUS | Status: AC
  Administered 2022-04-10: 1000 mL via INTRAVENOUS

## 2022-04-10 MED ORDER — THIAMINE MONONITRATE 100 MG PO TABS: 100.0000 mg | ORAL_TABLET | Freq: Every day | ORAL | Status: DC

## 2022-04-10 MED ORDER — PANTOPRAZOLE SODIUM 40 MG PO TBEC
40.0000 mg | DELAYED_RELEASE_TABLET | Freq: Every day | ORAL | Status: DC
  Administered 2022-04-10: 40 mg via ORAL
  Filled 2022-04-10: qty 1

## 2022-04-10 MED ORDER — MAGNESIUM SULFATE 2 GM/50ML IV SOLN
2.0000 g | Freq: Once | INTRAVENOUS | Status: AC
  Administered 2022-04-10: 2 g via INTRAVENOUS
  Filled 2022-04-10: qty 50

## 2022-04-10 MED ORDER — LORAZEPAM 2 MG/ML IJ SOLN: 0.0000 mg | Freq: Four times a day (QID) | INTRAMUSCULAR | Status: DC

## 2022-04-10 MED ORDER — ONDANSETRON HCL 4 MG/2ML IJ SOLN
4.0000 mg | Freq: Once | INTRAMUSCULAR | Status: AC
  Administered 2022-04-10: 4 mg via INTRAVENOUS
  Filled 2022-04-10: qty 2

## 2022-04-10 MED ORDER — ADULT MULTIVITAMIN W/MINERALS CH
1.0000 | ORAL_TABLET | Freq: Every day | ORAL | Status: DC
  Administered 2022-04-10: 1 via ORAL
  Filled 2022-04-10: qty 1

## 2022-04-10 MED ORDER — THIAMINE HCL 100 MG PO TABS
100.0000 mg | ORAL_TABLET | Freq: Every day | ORAL | Status: DC
  Administered 2022-04-10: 100 mg via ORAL
  Filled 2022-04-10 (×2): qty 1

## 2022-04-10 MED ORDER — FOLIC ACID 1 MG PO TABS
1.0000 mg | ORAL_TABLET | Freq: Every day | ORAL | Status: DC
  Administered 2022-04-10: 1 mg via ORAL
  Filled 2022-04-10: qty 1

## 2022-04-10 MED ORDER — IOHEXOL 300 MG/ML  SOLN
100.0000 mL | Freq: Once | INTRAMUSCULAR | Status: AC | PRN
  Administered 2022-04-10: 100 mL via INTRAVENOUS

## 2022-04-10 MED ORDER — ONDANSETRON HCL 4 MG PO TABS: 4.0000 mg | ORAL_TABLET | Freq: Four times a day (QID) | ORAL | Status: DC | PRN

## 2022-04-10 MED ORDER — MORPHINE SULFATE (PF) 4 MG/ML IV SOLN
4.0000 mg | Freq: Once | INTRAVENOUS | Status: AC
  Administered 2022-04-10: 4 mg via INTRAVENOUS
  Filled 2022-04-10: qty 1

## 2022-04-10 MED ORDER — THIAMINE HCL 100 MG PO TABS: 100.0000 mg | ORAL_TABLET | Freq: Every day | ORAL | 1 refills | Status: DC

## 2022-04-10 MED ORDER — ACETAMINOPHEN 650 MG RE SUPP: 650.0000 mg | Freq: Four times a day (QID) | RECTAL | Status: DC | PRN

## 2022-04-10 MED ORDER — ONDANSETRON HCL 4 MG/2ML IJ SOLN: 4.0000 mg | Freq: Four times a day (QID) | INTRAMUSCULAR | Status: DC | PRN

## 2022-04-10 NOTE — Discharge Instructions (Signed)
Advised to follow up with PCP in one week. Advised to refrain from drinking alcohol. Labs improved.

## 2022-04-10 NOTE — Assessment & Plan Note (Deleted)
Elevated lipase Diverticulosis without diverticulitis CT nonacute showing diverticulosis without diverticulitis Clear liquid diet Pain control Antiemetics, IV fluids and supportive care

## 2022-04-10 NOTE — ED Triage Notes (Signed)
Pt bib ems c/o LLQ pain, nausea, and endorses poor appetite x 2 days.

## 2022-04-10 NOTE — Telephone Encounter (Signed)
FYI Derrel Nip  Rasheedah- how soon can pt get appt with Dr Haig Prophet ? She is changing from Dr Allen Norris as she previously saw him for colonoscopy.   [facility]  Pt is in the ED currently for abdominal pain, weakness, hyponatremia. CT a/p with enlarged bile duct  I have placed GI referral for evaluation of abdominal pain, enlarged bile duct, probable hepatic steatosis; she can discuss EGD and colonoscopy during visit.   she needs to have ED follow up with Dr Derrel Nip preferably. I am happy to see her as well if I have earlier appointment

## 2022-04-10 NOTE — Care Plan (Signed)
This 62 years old female with PMH significant for alcohol use disorder, GERD, anxiety, depression, prior admission for alcohol withdrawal who presented in the ED with complaints of left upper quadrant abdominal pain associated with nausea for 2 days.  She denies any changes in the bowel habits, denies fever and vomiting.  She was tachycardic in the ED other vitals were stable.  Labs include lipase 101, AST 72.  329, total bili 2.1, sodium 123, anion gap 17 with a bicarb of 14.  EtOH negative.  CT abdomen and pelvis without any acute findings.  Patient admitted for further evaluation and started on IV magnesium sulfate started on CIWA withdrawal protocol.  Patient feels better and wants to be discharged.

## 2022-04-10 NOTE — ED Provider Notes (Signed)
[facility] Provider Note    Event Date/Time   First MD Initiated Contact with Patient 04/10/22 [phone]     (approximate)   History   Abdominal Pain   HPI  Kathy [name] is a 62 y.o. female brought to the ED via EMS from home with a chief complaint of left lower quadrant abdominal pain.  Patient endorses a 2-day history of abdominal pain, nausea without vomiting and poor appetite.  Denies recent EtOH.  History of hiatal hernia and atrophic left kidney.  Denies fever/chills, cough, chest pain, shortness of breath, diarrhea.     Past Medical History   Past Medical History:  Diagnosis Date   Abnormal weight loss    Alcohol abuse    Alcohol abuse with withdrawal ([facility]) 10/13/2020   Anxiety    Chicken pox    Colon polyps    COPD (chronic obstructive pulmonary disease) (HCC)    Depression    Dyspnea    Fatty liver    Gallbladder mass 09/09/2020   8 x 6 x 6 mm non mobile  mass along GB wall noted on 2018 [place] 8 x  7 x 5 mm non mobile mass along GB wall noted on July  2022 [place]   GERD (gastroesophageal reflux disease)    Hernia of abdominal cavity    Hip fracture (HCC) right   Neuropathy    lower legs   Wears dentures    full upper and lower     Active Problem List   Patient Active Problem List   Diagnosis Date Noted   Alcohol use disorder 04/10/2022   Elevated lipase 04/10/2022   Left upper quadrant abdominal pain 04/10/2022   Bronchitis 12/12/2021   Hypoglycemia 05/28/2021   Status post laparoscopic cholecystectomy 05/08/2021   Abdominal aortic atherosclerosis ([place]) 12/21/2020   Thrombocytopenia ([place]) [id number]   Alcoholic hepatitis without ascites 09/09/2020   Paraesophageal hiatal hernia 09/09/2020   Constipation 05/21/2020   Mild malnutrition ([place]) 05/20/2020   Mild episode of recurrent major depressive disorder ([place]) 05/20/2020   Essential hypertension 10/03/2019   Insomnia due to anxiety and fear 09/29/2019   GAD (generalized  anxiety disorder) 09/19/2018   Poor dentition 05/18/2018   Tobacco abuse 02/22/2018   Atrophy of left kidney 02/22/2018   Diverticulosis of colon without diverticulitis 02/22/2018   MDD (major depressive disorder) 09/13/2017   Osteoporosis 10/17/2016   Pernicious anemia 01/01/2016   Hypomagnesemia 01/01/2016   Hyponatremia 09/10/2015   Hammertoe 07/31/2015   Encounter for preventive health examination [id number]   Alcoholic peripheral neuropathy ([place]) 04/23/2015   Fatty liver, alcoholic [id number]   Alcohol dependence with alcohol-induced mood disorder ([place])    Alcoholic ketoacidosis [id number]     Past Surgical History   Past Surgical History:  Procedure Laterality Date   CATARACT EXTRACTION W/PHACO Right 07/29/2021   Procedure: CATARACT EXTRACTION PHACO AND INTRAOCULAR LENS PLACEMENT (IOC) RIGHT 19.12 01:27.4;  Surgeon: Birder Robson, MD;  Location: [place];  Service: Ophthalmology;  Laterality: Right;   CATARACT EXTRACTION W/PHACO Left 08/12/2021   Procedure: CATARACT EXTRACTION PHACO AND INTRAOCULAR LENS PLACEMENT (IOC) LEFT 13.10 01:05.3;  Surgeon: Birder Robson, MD;  Location: [place];  Service: Ophthalmology;  Laterality: Left;   COLONOSCOPY     ROBOTIC ASSISTED LAPAROSCOPIC CHOLECYSTECTOMY  04/16/2021   [facility]     Home Medications   Prior to Admission medications   Medication Sig Start Date End Date Taking? Authorizing Provider  amLODipine (NORVASC) 5 MG tablet TAKE 1  TABLET BY MOUTH TWICE DAILY 03/24/22   Crecencio Mc, MD  benzonatate (TESSALON) 100 MG capsule Take 1 capsule (100 mg total) by mouth 3 (three) times daily as needed for cough. 12/12/21   Burnard Hawthorne, FNP  budesonide-formoterol (SYMBICORT) 80-4.5 MCG/ACT inhaler Inhale 2 puffs into the lungs 2 (two) times daily. 12/12/21   Burnard Hawthorne, FNP  cyanocobalamin (,VITAMIN B-12,) 1000 MCG/ML injection Inject 1 mL (1,000 mcg total) into the muscle every 14 (fourteen)  days. 06/21/19   Crecencio Mc, MD  cyclobenzaprine (FLEXERIL) 10 MG tablet TAKE 1 TABLET BY MOUTH THREE TIMES DAILYAS NEEDED FOR MUSCLE SPASMS INVOLVING LEGS AND FEET 11/28/21   Crecencio Mc, MD  denosumab (PROLIA) 60 MG/ML SOSY injection Inject 60 mg into the skin every 6 (six) months.    [provider]  gabapentin (NEURONTIN) 400 MG capsule TAKE TWO CAPSULES BY MOUTH THREE TIMES DAILY 01/30/22   Crecencio Mc, MD  omeprazole (PRILOSEC) 40 MG capsule TAKE ONE CAPSULE BY MOUTH TWO TIMES DAILY 03/26/22   Crecencio Mc, MD  ondansetron (ZOFRAN-ODT) 4 MG disintegrating tablet Take 1 tablet (4 mg total) by mouth every 8 (eight) hours as needed for nausea or vomiting. 03/22/22   Blake Divine, MD  pediatric multivitamin + iron (POLY-VI-SOL + IRON) 11 MG/ML SOLN oral solution Take 1 mL by mouth daily. 10/24/21   Crecencio Mc, MD  Syringe/Needle, Disp, (SYRINGE 3CC/25GX1") 25G X 1" 3 ML MISC Use for b12 injections 06/21/19   Crecencio Mc, MD  varenicline (CHANTIX CONTINUING MONTH PAK) 1 MG tablet Take 1 tablet (1 mg total) by mouth 2 (two) times daily. 03/26/22   Crecencio Mc, MD  Varenicline Tartrate, Starter, (CHANTIX STARTING MONTH PAK) 0.5 MG X 11 & 1 MG X 42 TBPK Follow the directions on the box 03/26/22   Crecencio Mc, MD     Allergies  Buspirone and Lyrica [pregabalin]   Family History   Family History  Problem Relation Age of Onset   Hypertension Mother    Osteoporosis Mother    Meniere's disease Mother    Cancer Father 57       prostate   Colon cancer Paternal Grandmother    Alcohol abuse Other      Physical Exam  Triage Vital Signs: ED Triage Vitals  Enc Vitals Group     BP      Pulse      Resp      Temp      Temp src      SpO2      Weight      Height      Head Circumference      Peak Flow      Pain Score      Pain Loc      Pain Edu?      Excl. in [place]?     Updated Vital Signs: BP 122/72   Pulse 99   Temp (!) 97.5 F (36.4 C) (Oral)   Resp  19   Ht 5' 2"$  (1.575 m)   Wt 48.1 kg   SpO2 100%   BMI 19.39 kg/m    General: Awake, mild distress.  CV:  Tachycardic good peripheral perfusion.  Resp:  Normal effort.  CTAB Abd:  Mild tenderness to left lower quadrant without rebound or guarding.  No distention.  Other:  No truncal vesicles.   ED Results / Procedures / Treatments  Labs (all labs ordered  are listed, but only abnormal results are displayed) Labs Reviewed  CBC WITH DIFFERENTIAL/PLATELET - Abnormal; Notable for the following components:      Result Value   RBC 3.50 (*)    MCV 104.6 (*)    MCH 35.4 (*)    All other components within normal limits  COMPREHENSIVE METABOLIC PANEL - Abnormal; Notable for the following components:   Sodium 123 (*)    Chloride 92 (*)    CO2 14 (*)    Glucose, Bld 127 (*)    AST 72 (*)    Total Bilirubin 2.1 (*)    Anion gap 17 (*)    All other components within normal limits  LIPASE, BLOOD - Abnormal; Notable for the following components:   Lipase 101 (*)    All other components within normal limits  URINALYSIS, ROUTINE W REFLEX MICROSCOPIC - Abnormal; Notable for the following components:   Color, Urine STRAW (*)    APPearance CLEAR (*)    Specific Gravity, Urine 1.033 (*)    Ketones, ur 20 (*)    All other components within normal limits  MAGNESIUM - Abnormal; Notable for the following components:   Magnesium 1.2 (*)    All other components within normal limits  ETHANOL  HIV ANTIBODY (ROUTINE TESTING W REFLEX)  TROPONIN I (HIGH SENSITIVITY)  TROPONIN I (HIGH SENSITIVITY)     EKG  ED ECG REPORT I, Keison Glendinning J, the attending physician, personally viewed and interpreted this ECG.   Date: 04/10/2022  EKG Time: 0318  Rate: 112  Rhythm: sinus tachycardia  Axis: Normal  Intervals:none  ST&T Change: Nonspecific    RADIOLOGY I have independently visualized and interpreted patient's CT scan as well as noted the radiology interpretation:  CT abdomen/pelvis:  Diverticulosis without acute diverticulitis  Official radiology report(s): CT Abdomen Pelvis W Contrast  Result Date: 04/10/2022 CLINICAL DATA:  Left lower quadrant pain with nausea and poor appetite EXAM: CT ABDOMEN AND PELVIS WITH CONTRAST TECHNIQUE: Multidetector CT imaging of the abdomen and pelvis was performed using the standard protocol following bolus administration of intravenous contrast. RADIATION DOSE REDUCTION: This exam was performed according to the departmental dose-optimization program which includes automated exposure control, adjustment of the mA and/or kV according to patient size and/or use of iterative reconstruction technique. CONTRAST:  129m OMNIPAQUE IOHEXOL 300 MG/ML  SOLN COMPARISON:  03/22/2022 FINDINGS: Lower chest:  Large sliding hiatal hernia, unchanged. Hepatobiliary: There is likely steatosis of the liver but limited by post contrast timing. No focal lesioncholecystectomy which may account for the distended common bile duct from reservoir effect, CBD measuring up to 12 mm above the pancreas. Pancreas: Unremarkable. Spleen: Unremarkable. Adrenals/Urinary Tract: Negative adrenals. No hydronephrosis or stone. Severe left renal atrophy with less distended dysmorphic pelvis since prior. 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 Compensatory hypertrophy of the right kidney. Unremarkable bladder. Stomach/Bowel: No obstruction. Innumerable colonic diverticula distally. No superimposed inflammation. Vascular/Lymphatic: No acute vascular abnormality. Extensive atheromatous calcification of the aorta and iliacs. No mass or adenopathy. Reproductive:No pathologic findings. Other: No ascites or pneumoperitoneum. Musculoskeletal: No acute abnormalities. No acute or aggressive finding IMPRESSION: 1. No acute finding. Colonic diverticulosis without visible diverticulitis. 2. Numerous chronic findings including atherosclerosis, large hiatal hernia, probable hepatic steatosis, and  severe left renal atrophy. Electronically Signed   By: JJorje GuildM.D.   On: 04/10/2022 04:30     PROCEDURES:  Critical Care performed: Yes, see critical care procedure note(s)  CRITICAL CARE Performed by: SPaulette Blanch  Total critical care time: 30 minutes  Critical care time was exclusive of separately billable procedures and treating other patients.  Critical care was necessary to treat or prevent imminent or life-threatening deterioration.  Critical care was time spent personally by me on the following activities: development of treatment plan with patient and/or surrogate as well as nursing, discussions with consultants, evaluation of patient's response to treatment, examination of patient, obtaining history from patient or surrogate, ordering and performing treatments and interventions, ordering and review of laboratory studies, ordering and review of radiographic studies, pulse oximetry and re-evaluation of patient's condition.   [name]1-3 Lead EKG Interpretation  Performed by: Paulette Blanch, MD Authorized by: Paulette Blanch, MD     Interpretation: abnormal     ECG rate:  110   ECG rate assessment: tachycardic     Rhythm: sinus tachycardia     Ectopy: none     Conduction: normal   Comments:     Patient placed on cardiac monitor to evaluate for arrhythmias    MEDICATIONS ORDERED IN ED: Medications  thiamine (VITAMIN B1) tablet 100 mg (has no administration in time range)  LORazepam (ATIVAN) injection 0-4 mg ( Intravenous Not Given 04/10/22 0520)  amLODipine (NORVASC) tablet 5 mg (has no administration in time range)  pantoprazole (PROTONIX) EC tablet 40 mg (has no administration in time range)  enoxaparin (LOVENOX) injection 40 mg (has no administration in time range)  acetaminophen (TYLENOL) tablet 650 mg (has no administration in time range)    Or  acetaminophen (TYLENOL) suppository 650 mg (has no administration in time range)  ondansetron (ZOFRAN) tablet 4 mg (has no  administration in time range)    Or  ondansetron (ZOFRAN) injection 4 mg (has no administration in time range)  lactated ringers infusion ( Intravenous New Bag/Given [id number])  folic acid (FOLVITE) tablet 1 mg (has no administration in time range)  multivitamin with minerals tablet 1 tablet (has no administration in time range)  morphine (PF) 2 MG/ML injection 2 mg (has no administration in time range)  sodium chloride 0.9 % bolus 1,000 mL (0 mLs Intravenous Stopped 04/10/22 0508)  ondansetron (ZOFRAN) injection 4 mg (4 mg Intravenous Given 04/10/22 0333)  morphine (PF) 4 MG/ML injection 4 mg (4 mg Intravenous Given 04/10/22 0333)  iohexol (OMNIPAQUE) 300 MG/ML solution 100 mL (100 mLs Intravenous Contrast Given 04/10/22 0410)  magnesium sulfate IVPB 2 g 50 mL (0 g Intravenous Stopped 04/10/22 0606)  morphine (PF) 4 MG/ML injection 4 mg (4 mg Intravenous Given 04/10/22 0605)  ondansetron (ZOFRAN) injection 4 mg (4 mg Intravenous Given 04/10/22 0605)     IMPRESSION / MDM / ASSESSMENT AND PLAN / ED COURSE  I reviewed the triage vital signs and the nursing notes.                             62 year old female presenting with left lower quadrant abdominal pain and nausea. Differential diagnosis includes, but is not limited to, ovarian cyst, ovarian torsion, acute appendicitis, diverticulitis, urinary tract infection/pyelonephritis, endometriosis, bowel obstruction, colitis, renal colic, gastroenteritis, hernia, fibroids, etc. I have personally reviewed patient's records and note a PCP office visit on 03/26/2022 for follow-up electrolyte disturbance.  Patient's presentation is most consistent with acute presentation with potential threat to life or bodily function.  The patient is on the cardiac monitor to evaluate for evidence of arrhythmia and/or significant heart rate changes.  Will order lab work, UA, CT  abdomen/pelvis.  Placed on CIWA.  Initiate IV fluid resuscitation, IV morphine for pain.   With IV Zofran for nausea.  Will reassess.  Clinical Course as of 04/10/22 0630  Fri Apr 10, 2022  0432 Laboratory results demonstrate normal WBC 7.2, hyponatremia with sodium 123, elevation of anion gap, hypomagnesemia with magnesium 1.2, elevated lipase 101.  Will replete magnesium via IV, continue IV fluids. [JS]    Clinical Course User Index [JS] Paulette Blanch, MD     FINAL CLINICAL IMPRESSION(S) / ED DIAGNOSES   Final diagnoses:  Left lower quadrant abdominal pain  Weakness generalized  Hyponatremia  Hypomagnesemia  Alcoholic ketoacidosis  Alcohol-induced acute pancreatitis, unspecified complication status     Rx / DC Orders   ED Discharge Orders     None        Note:  This document was prepared using Dragon voice recognition software and may include unintentional dictation errors.   Paulette Blanch, MD 04/10/22 [phone]

## 2022-04-10 NOTE — Telephone Encounter (Signed)
Pt is already scheduled in July 2024 to see Dr Jacqulyn Liner at [facility]. Do you want pt to seen sooner? Please advise and Thank you!

## 2022-04-10 NOTE — Assessment & Plan Note (Signed)
CIWA withdrawal protocol as EtOH level was less than 10 patient has prior admission for withdrawal

## 2022-04-10 NOTE — Discharge Summary (Signed)
Physician Discharge Summary  Kathy [name] [id number] DOB: 07-23-1960 DOA: 04/10/2022  PCP: Crecencio Mc, MD  Admit date: 04/10/2022  Discharge date: 04/10/2022  Admitted From: Home.  Disposition:  Home.  Recommendations for Outpatient Follow-up:  Follow up with PCP in 1-2 weeks. Please obtain BMP/CBC in one week. Advised to refrain from drinking alcohol. Labs improved.  Home Health: None Equipment/Devices:None  Discharge Condition: Stable CODE STATUS:Full code Diet recommendation: Heart Healthy  [facility]rief [facility] Course: This 62 years old female with PMH significant for alcohol use disorder, GERD, anxiety, depression, prior admission for alcohol withdrawal who presented in the ED with complaints of left upper quadrant abdominal pain associated with nausea for 2 days. She denies any changes in the bowel habits, denies any fever and vomiting. She was tachycardic in the ED but other vitals were stable. Labs include lipase 101, AST 72, ALT 329, total bili 2.1, sodium 123, anion gap 17 with a bicarb of 14. EtOH negative. CT abdomen and pelvis without any acute findings. Patient was admitted for further evaluation and started on IV magnesium sulfate,  started on CIWA withdrawal protocol. Patient feels better and wants to be discharged. She was continued on IV fluid resuscitation, anion gap closed.  Labs improved.  Patient wants to be discharged. She states that she is the only child who is taking care of elderly parents. She wants to be discharged.  Patient is being discharged home.   Discharge Diagnoses:  Principal Problem:   Alcoholic ketoacidosis Active Problems:   Left upper quadrant abdominal pain   Hypomagnesemia   Diverticulosis of colon without diverticulitis   Essential hypertension   Alcohol use disorder   Elevated lipase    Discharge Instructions: Advised to refrain from drinking alcohol. Labs improved. Ketoacidosis resolved.  Discharge Instructions      Call MD for:  difficulty breathing, headache or visual disturbances   Complete by: As directed    Call MD for:  persistant dizziness or light-headedness   Complete by: As directed    Call MD for:  persistant nausea and vomiting   Complete by: As directed    Diet - low sodium heart healthy   Complete by: As directed    Diet Carb Modified   Complete by: As directed    Discharge instructions   Complete by: As directed    Advised to follow up with PCP in one week. Advised to refrain from drinking alcohol. Labs improved.   Increase activity slowly   Complete by: As directed       Allergies as of 04/10/2022       Reactions   Buspirone Hives   Lyrica [pregabalin] Other (See Comments)   Blurred vision         Medication List     TAKE these medications    amLODipine 5 MG tablet Commonly known as: NORVASC TAKE 1 TABLET BY MOUTH TWICE DAILY   benzonatate 100 MG capsule Commonly known as: TESSALON Take 1 capsule (100 mg total) by mouth 3 (three) times daily as needed for cough.   budesonide-formoterol 80-4.5 MCG/ACT inhaler Commonly known as: SYMBICORT Inhale 2 puffs into the lungs 2 (two) times daily.   cyanocobalamin 1000 MCG/ML injection Commonly known as: VITAMIN B12 Inject 1 mL (1,000 mcg total) into the muscle every 14 (fourteen) days.   cyclobenzaprine 10 MG tablet Commonly known as: FLEXERIL TAKE 1 TABLET BY MOUTH THREE TIMES DAILYAS NEEDED FOR MUSCLE SPASMS INVOLVING LEGS AND FEET   denosumab 60 MG/ML  Sosy injection Commonly known as: PROLIA Inject 60 mg into the skin every 6 (six) months.   gabapentin 400 MG capsule Commonly known as: NEURONTIN TAKE TWO CAPSULES BY MOUTH THREE TIMES DAILY   omeprazole 40 MG capsule Commonly known as: PRILOSEC TAKE ONE CAPSULE BY MOUTH TWO TIMES DAILY   ondansetron 4 MG disintegrating tablet Commonly known as: ZOFRAN-ODT Take 1 tablet (4 mg total) by mouth every 8 (eight) hours as needed for nausea or vomiting.    pediatric multivitamin + iron 11 MG/ML Soln oral solution Take 1 mL by mouth daily.   SYRINGE 3CC/25GX1" 25G X 1" 3 ML Misc Use for b12 injections   thiamine 100 MG tablet Commonly known as: VITAMIN B1 Take 1 tablet (100 mg total) by mouth daily. Start taking on: April 11, 2022   Varenicline Tartrate (Starter) 0.5 MG X 11 & 1 MG X 42 Tbpk Commonly known as: Chantix Starting Month Pak Follow the directions on the box   varenicline 1 MG tablet Commonly known as: Chantix Continuing Month Pak Take 1 tablet (1 mg total) by mouth 2 (two) times daily.        Follow-up Information     Crecencio Mc, MD Follow up in 1 week(s).   Specialty: Internal Medicine Contact information: [place] [address] [place] [address] [phone]                Allergies  Allergen Reactions   Buspirone Hives   Lyrica [Pregabalin] Other (See Comments)    Blurred vision     Consultations: None   Procedures/Studies: CT Abdomen Pelvis W Contrast  Result Date: 04/10/2022 CLINICAL DATA:  Left lower quadrant pain with nausea and poor appetite EXAM: CT ABDOMEN AND PELVIS WITH CONTRAST TECHNIQUE: Multidetector CT imaging of the abdomen and pelvis was performed using the standard protocol following bolus administration of intravenous contrast. RADIATION DOSE REDUCTION: This exam was performed according to the departmental dose-optimization program which includes automated exposure control, adjustment of the mA and/or kV according to patient size and/or use of iterative reconstruction technique. CONTRAST:  142m OMNIPAQUE IOHEXOL 300 MG/ML  SOLN COMPARISON:  03/22/2022 FINDINGS: Lower chest:  Large sliding hiatal hernia, unchanged. Hepatobiliary: There is likely steatosis of the liver but limited by post contrast timing. No focal lesioncholecystectomy which may account for the distended common bile duct from reservoir effect, CBD measuring up to 12 mm above the pancreas. Pancreas:  Unremarkable. Spleen: Unremarkable. Adrenals/Urinary Tract: Negative adrenals. No hydronephrosis or stone. Severe left renal atrophy with less distended dysmorphic pelvis since prior. 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 Compensatory hypertrophy of the right kidney. Unremarkable bladder. Stomach/Bowel: No obstruction. Innumerable colonic diverticula distally. No superimposed inflammation. Vascular/Lymphatic: No acute vascular abnormality. Extensive atheromatous calcification of the aorta and iliacs. No mass or adenopathy. Reproductive:No pathologic findings. Other: No ascites or pneumoperitoneum. Musculoskeletal: No acute abnormalities. No acute or aggressive finding IMPRESSION: 1. No acute finding. Colonic diverticulosis without visible diverticulitis. 2. Numerous chronic findings including atherosclerosis, large hiatal hernia, probable hepatic steatosis, and severe left renal atrophy. Electronically Signed   By: JJorje GuildM.D.   On: 04/10/2022 04:30   CT Abdomen Pelvis W Contrast  Result Date: 03/22/2022 CLINICAL DATA:  62year old female with acute abdominal pain, vomiting, cough. EXAM: CT ABDOMEN AND PELVIS WITH CONTRAST TECHNIQUE: Multidetector CT imaging of the abdomen and pelvis was performed using the standard protocol following bolus administration of intravenous contrast. RADIATION DOSE REDUCTION: This exam was performed according  to the departmental dose-optimization program which includes automated exposure control, adjustment of the mA and/or kV according to patient size and/or use of iterative reconstruction technique. CONTRAST:  163m OMNIPAQUE IOHEXOL 300 MG/ML  SOLN COMPARISON:  CT Abdomen and Pelvis 05/25/2021. FINDINGS: Lower chest: Moderate to large gastric hiatal hernia appears stable. No cardiomegaly, pericardial effusion, pleural effusion. Respiratory motion at the lung bases which seem to remain clear. Hepatobiliary: Geographic hepatic steatosis. Absent  gallbladder with chronic biliary ductal enlargement. No ductal filling defect identified. Pancreas: Negative. Spleen: Negative. Adrenals/Urinary Tract: Chronic cystic, dysplastic left kidney is unchanged since CT Abdomen and Pelvis 03/14/2014. Orthotopic right kidney with compensatory hypertrophy demonstrates normal enhancement and contrast excretion. No right nephrolithiasis or renal inflammation identified. Adrenal glands are within normal limits. Diminutive bladder. Occasional pelvic phleboliths. Stomach/Bowel: Diverticulosis throughout the descending and sigmoid colon, with an indistinct appearance of the sigmoid wall similar to last year (series 2, image 650. No mesenteric inflammation associated. Occasional diverticula at the hepatic flexure. Otherwise negative transverse and right colon. Normal appendix terminating near coronal image 36. Decompressed terminal ileum. No dilated small bowel. Chronic gastric hiatal hernia. Intra-abdominal stomach is negative. Gas-filled duodenum and proximal jejunum. No free air or free fluid. Vascular/Lymphatic: Extensive Aortoiliac calcified atherosclerosis. Major arterial structures in the abdomen and pelvis remain patent. Portal venous system is patent. No lymphadenopathy identified. Reproductive: Negative. Other: No pelvis free fluid. Musculoskeletal: No acute osseous abnormality identified. IMPRESSION: 1. No acute or inflammatory process identified in the abdomen or pelvis. 2. Chronic findings including moderate to large gastric hiatal hernia, advanced Aortic Atherosclerosis (ICD10-I70.0) , hypertrophied solitary functioning right kidney, diverticulosis of the distal large bowel. Electronically Signed   By: HGenevie AnnM.D.   On: 03/22/2022 11:32     Subjective: Patient was seen and examined at bed side. She feels better and wants to be discharged.  Anion gap improved.  Labs improved. Patient being discharged home.  Discharge Exam: Vitals:   04/10/22 1330 04/10/22 1500   BP: 104/66 102/60  Pulse: 85 91  Resp: 17 18  Temp:  98.2 F (36.8 C)  SpO2: 96% 95%   Vitals:   04/10/22 1116 04/10/22 1230 04/10/22 1330 04/10/22 1500  BP: 99/84 93/71 104/66 102/60  Pulse: 87 90 85 91  Resp: 16 17 17 18  $ Temp: 98 F (36.7 C)   98.2 F (36.8 C)  TempSrc:      SpO2: 100% 100% 96% 95%  Weight:      Height:        General: Pt is alert, awake, not in acute distress Cardiovascular: RRR, S1/S2 +, no rubs, no gallops Respiratory: CTA bilaterally, no wheezing, no rhonchi Abdominal: Soft, NT, ND, bowel sounds + Extremities: no edema, no cyanosis    The results of significant diagnostics from this hospitalization (including imaging, microbiology, ancillary and laboratory) are listed below for reference.     Microbiology: No results found for this or any previous visit (from the past 240 hour(s)).   Labs: BNP (last 3 results) Recent Labs    05/15/21 1002  BNP [id number]  Basic Metabolic Panel: Recent Labs  Lab 04/10/22 0318 04/10/22 1402  NA 123* 129*  K 3.9 4.1  CL 92* 100  CO2 14* 20*  GLUCOSE 127* 112*  BUN 8 6  CREATININE 0.72 0.47  CALCIUM 9.1 8.4*  MG 1.2* 2.2   Liver Function Tests: Recent Labs  Lab 04/10/22 0318  AST 72*  ALT 29  ALKPHOS 85  BILITOT 2.1*  PROT 7.3  ALBUMIN 3.9   Recent Labs  Lab 04/10/22 0318  LIPASE 101*   No results for input(s): "AMMONIA" in the last 168 hours. CBC: Recent Labs  Lab 04/10/22 0318  WBC 7.2  NEUTROABS 4.4  HGB 12.4  HCT 36.6  MCV 104.6*  PLT 169   Cardiac Enzymes: No results for input(s): "CKTOTAL", "CKMB", "CKMBINDEX", "TROPONINI" in the last 168 hours. BNP: Invalid input(s): "POCBNP" CBG: No results for input(s): "GLUCAP" in the last 168 hours. D-Dimer No results for input(s): "DDIMER" in the last 72 hours. Hgb A1c No results for input(s): "HGBA1C" in the last 72 hours. Lipid Profile No results for input(s): "CHOL", "HDL", "LDLCALC", "TRIG", "CHOLHDL", "LDLDIRECT" in the  last 72 hours. Thyroid function studies No results for input(s): "TSH", "T4TOTAL", "T3FREE", "THYROIDAB" in the last 72 hours.  Invalid input(s): "FREET3" Anemia work up No results for input(s): "VITAMINB12", "FOLATE", "FERRITIN", "TIBC", "IRON", "RETICCTPCT" in the last 72 hours. Urinalysis    Component Value Date/Time   COLORURINE STRAW (A) 04/10/2022 0600   APPEARANCEUR CLEAR (A) 04/10/2022 0600   APPEARANCEUR Cloudy (A) 05/20/2020 1557   LABSPEC 1.033 (H) 04/10/2022 0600   PHURINE 5.0 04/10/2022 0600   GLUCOSEU NEGATIVE 04/10/2022 0600   GLUCOSEU 100 (A) 03/26/2022 1157   HGBUR NEGATIVE 04/10/2022 0600   BILIRUBINUR NEGATIVE 04/10/2022 0600   BILIRUBINUR Negative 05/20/2020 1557   KETONESUR 20 (A) 04/10/2022 0600   PROTEINUR NEGATIVE 04/10/2022 0600   UROBILINOGEN >=8.0 (A) 03/26/2022 1157   NITRITE NEGATIVE 04/10/2022 0600   LEUKOCYTESUR NEGATIVE 04/10/2022 0600   Sepsis Labs Recent Labs  Lab 04/10/22 0318  WBC 7.2   Microbiology No results found for this or any previous visit (from the past 240 hour(s)).   Time coordinating discharge: Over 30 minutes  SIGNED:   Shawna Clamp, MD  [facility] 04/10/2022, 3:42 PM Pager   If 7PM-7AM, please contact night-coverage

## 2022-04-10 NOTE — ED Notes (Signed)
Pt verbalizes understanding of discharge instructions. Opportunity for questioning and answers were provided. Pt discharged from ED to home with friend.

## 2022-04-10 NOTE — H&P (Addendum)
History and Physical    Patient: Kathy [name] [id number] DOB: 1960-09-17 DOA: 04/10/2022 DOS: the patient was seen and examined on 04/10/2022 PCP: Crecencio Mc, MD  Patient coming from: Home  Chief Complaint:  Chief Complaint  Patient presents with   Abdominal Pain    HPI: Kathy [name] is a 62 y.o. female with medical history significant for Alcohol use disorder, GERD, anxiety and depression, prior admission for alcohol withdrawal who presents to the ED with left upper quadrant pain and nausea x 2 days.  She denies vomiting and change in bowel habit and denies fever.  Denies dysuria.  Has had no cough or shortness of breath. ED course and data review: Tachycardic to 116 with otherwise normal vitals.  Labs notable for lipase 101 with AST 72, ALT 29 and total bili 2.1.  Sodium 123, anion gap 17 with bicarb of 14 magnesium 1.2, EtOH negative EKG.  Next, personally viewed and interpreted shows sinus tachycardia at 112 with no concerning ischemic changes.  CT abdomen and pelvis with no acute finding.  Diverticulosis without diverticulitis. Patient was started on magnesium sulfate IV piggyback 2 g and CIWA withdrawal protocol, hospitalist consulted for admission.   Review of Systems: As mentioned in the history of present illness. All other systems reviewed and are negative.  Past Medical History:  Diagnosis Date   Abnormal weight loss    Alcohol abuse    Alcohol abuse with withdrawal ([facility]) 10/13/2020   Anxiety    Chicken pox    Colon polyps    COPD (chronic obstructive pulmonary disease) (HCC)    Depression    Dyspnea    Fatty liver    Gallbladder mass 09/09/2020   8 x 6 x 6 mm non mobile  mass along GB wall noted on 2018 [place] 8 x  7 x 5 mm non mobile mass along GB wall noted on July  2022 [place]   GERD (gastroesophageal reflux disease)    Hernia of abdominal cavity    Hip fracture (HCC) right   Neuropathy    lower legs   Wears dentures    full upper and lower    Past Surgical History:  Procedure Laterality Date   CATARACT EXTRACTION W/PHACO Right 07/29/2021   Procedure: CATARACT EXTRACTION PHACO AND INTRAOCULAR LENS PLACEMENT (IOC) RIGHT 19.12 01:27.4;  Surgeon: Birder Robson, MD;  Location: [facility];  Service: Ophthalmology;  Laterality: Right;   CATARACT EXTRACTION W/PHACO Left 08/12/2021   Procedure: CATARACT EXTRACTION PHACO AND INTRAOCULAR LENS PLACEMENT (IOC) LEFT 13.10 01:05.3;  Surgeon: Birder Robson, MD;  Location: [facility];  Service: Ophthalmology;  Laterality: Left;   COLONOSCOPY     ROBOTIC ASSISTED LAPAROSCOPIC CHOLECYSTECTOMY  04/16/2021   [facility]   Social History:  reports that she has been smoking cigarettes. She has a 20.00 pack-year smoking history. She has never used smokeless tobacco. She reports current alcohol use of about 42.0 standard drinks of alcohol per week. She reports that she does not use drugs.  Allergies  Allergen Reactions   Buspirone Hives   Lyrica [Pregabalin] Other (See Comments)    Blurred vision     Family History  Problem Relation Age of Onset   Hypertension Mother    Osteoporosis Mother    Meniere's disease Mother    Cancer Father 38       prostate   Colon cancer Paternal Grandmother    Alcohol abuse Other     Prior to Admission medications   Medication  Sig Start Date End Date Taking? Authorizing Provider  amLODipine (NORVASC) 5 MG tablet TAKE 1 TABLET BY MOUTH TWICE DAILY 03/24/22   Crecencio Mc, MD  benzonatate (TESSALON) 100 MG capsule Take 1 capsule (100 mg total) by mouth 3 (three) times daily as needed for cough. 12/12/21   Burnard Hawthorne, FNP  budesonide-formoterol (SYMBICORT) 80-4.5 MCG/ACT inhaler Inhale 2 puffs into the lungs 2 (two) times daily. 12/12/21   Burnard Hawthorne, FNP  cyanocobalamin (,VITAMIN B-12,) 1000 MCG/ML injection Inject 1 mL (1,000 mcg total) into the muscle every 14 (fourteen) days. 06/21/19   Crecencio Mc, MD  cyclobenzaprine  (FLEXERIL) 10 MG tablet TAKE 1 TABLET BY MOUTH THREE TIMES DAILYAS NEEDED FOR MUSCLE SPASMS INVOLVING LEGS AND FEET 11/28/21   Crecencio Mc, MD  denosumab (PROLIA) 60 MG/ML SOSY injection Inject 60 mg into the skin every 6 (six) months.    [provider]  gabapentin (NEURONTIN) 400 MG capsule TAKE TWO CAPSULES BY MOUTH THREE TIMES DAILY 01/30/22   Crecencio Mc, MD  omeprazole (PRILOSEC) 40 MG capsule TAKE ONE CAPSULE BY MOUTH TWO TIMES DAILY 03/26/22   Crecencio Mc, MD  ondansetron (ZOFRAN-ODT) 4 MG disintegrating tablet Take 1 tablet (4 mg total) by mouth every 8 (eight) hours as needed for nausea or vomiting. 03/22/22   Blake Divine, MD  pediatric multivitamin + iron (POLY-VI-SOL + IRON) 11 MG/ML SOLN oral solution Take 1 mL by mouth daily. 10/24/21   Crecencio Mc, MD  Syringe/Needle, Disp, (SYRINGE 3CC/25GX1") 25G X 1" 3 ML MISC Use for b12 injections 06/21/19   Crecencio Mc, MD  varenicline (CHANTIX CONTINUING MONTH PAK) 1 MG tablet Take 1 tablet (1 mg total) by mouth 2 (two) times daily. 03/26/22   Crecencio Mc, MD  Varenicline Tartrate, Starter, (CHANTIX STARTING MONTH PAK) 0.5 MG X 11 & 1 MG X 42 TBPK Follow the directions on the box 03/26/22   Crecencio Mc, MD    Physical Exam: Vitals:   04/10/22 0314 04/10/22 0316 04/10/22 0317 04/10/22 0400  BP:   136/84 124/78  Pulse:  (!) 116 (!) 114 (!) 103  Resp:  20 (!) 22 20  Temp:  (!) 97.5 F (36.4 C)    TempSrc:  Oral    SpO2:  100% 100% 100%  Weight: 48.1 kg     Height: 5' 2"$  (1.575 m)      Physical Exam Vitals and nursing note reviewed.  Constitutional:      General: She is not in acute distress. HENT:     Head: Normocephalic and atraumatic.  Cardiovascular:     Rate and Rhythm: Normal rate and regular rhythm.     Heart sounds: Normal heart sounds.  Pulmonary:     Effort: Pulmonary effort is normal.     Breath sounds: Normal breath sounds.  Abdominal:     Palpations: Abdomen is soft.     Tenderness:  There is abdominal tenderness in the left upper quadrant.  Neurological:     Mental Status: Mental status is at baseline.     Labs on Admission: I have personally reviewed following labs and imaging studies  CBC: Recent Labs  Lab 04/10/22 0318  WBC 7.2  NEUTROABS 4.4  HGB 12.4  HCT 36.6  MCV 104.6*  PLT 123XX123   Basic Metabolic Panel: Recent Labs  Lab 04/10/22 0318  NA 123*  K 3.9  CL 92*  CO2 14*  GLUCOSE 127*  BUN 8  CREATININE 0.72  CALCIUM 9.1  MG 1.2*   GFR: Estimated Creatinine Clearance: 56.8 mL/min (by C-G formula based on SCr of 0.72 mg/dL). Liver Function Tests: Recent Labs  Lab 04/10/22 0318  AST 72*  ALT 29  ALKPHOS 85  BILITOT 2.1*  PROT 7.3  ALBUMIN 3.9   Recent Labs  Lab 04/10/22 0318  LIPASE 101*   No results for input(s): "AMMONIA" in the last 168 hours. Coagulation Profile: No results for input(s): "INR", "PROTIME" in the last 168 hours. Cardiac Enzymes: No results for input(s): "CKTOTAL", "CKMB", "CKMBINDEX", "TROPONINI" in the last 168 hours. BNP (last 3 results) No results for input(s): "PROBNP" in the last 8760 hours. HbA1C: No results for input(s): "HGBA1C" in the last 72 hours. CBG: No results for input(s): "GLUCAP" in the last 168 hours. Lipid Profile: No results for input(s): "CHOL", "HDL", "LDLCALC", "TRIG", "CHOLHDL", "LDLDIRECT" in the last 72 hours. Thyroid Function Tests: No results for input(s): "TSH", "T4TOTAL", "FREET4", "T3FREE", "THYROIDAB" in the last 72 hours. Anemia Panel: No results for input(s): "VITAMINB12", "FOLATE", "FERRITIN", "TIBC", "IRON", "RETICCTPCT" in the last 72 hours. Urine analysis:    Component Value Date/Time   COLORURINE YELLOW 03/26/2022 1157   APPEARANCEUR Sl Cloudy (A) 03/26/2022 1157   APPEARANCEUR Cloudy (A) 05/20/2020 1557   LABSPEC 1.010 03/26/2022 1157   PHURINE 6.5 03/26/2022 1157   GLUCOSEU 100 (A) 03/26/2022 1157   HGBUR NEGATIVE 03/26/2022 1157   BILIRUBINUR SMALL (A)  03/26/2022 1157   BILIRUBINUR Negative 05/20/2020 1557   KETONESUR 15 (A) 03/26/2022 1157   PROTEINUR 100 (A) 03/22/2022 0850   UROBILINOGEN >=8.0 (A) 03/26/2022 1157   NITRITE NEGATIVE 03/26/2022 1157   LEUKOCYTESUR SMALL (A) 03/26/2022 1157    Radiological Exams on Admission: CT Abdomen Pelvis W Contrast  Result Date: 04/10/2022 CLINICAL DATA:  Left lower quadrant pain with nausea and poor appetite EXAM: CT ABDOMEN AND PELVIS WITH CONTRAST TECHNIQUE: Multidetector CT imaging of the abdomen and pelvis was performed using the standard protocol following bolus administration of intravenous contrast. RADIATION DOSE REDUCTION: This exam was performed according to the departmental dose-optimization program which includes automated exposure control, adjustment of the mA and/or kV according to patient size and/or use of iterative reconstruction technique. CONTRAST:  156m OMNIPAQUE IOHEXOL 300 MG/ML  SOLN COMPARISON:  03/22/2022 FINDINGS: Lower chest:  Large sliding hiatal hernia, unchanged. Hepatobiliary: There is likely steatosis of the liver but limited by post contrast timing. No focal lesioncholecystectomy which may account for the distended common bile duct from reservoir effect, CBD measuring up to 12 mm above the pancreas. Pancreas: Unremarkable. Spleen: Unremarkable. Adrenals/Urinary Tract: Negative adrenals. No hydronephrosis or stone. Severe left renal atrophy with less distended dysmorphic pelvis since prior. 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 Compensatory hypertrophy of the right kidney. Unremarkable bladder. Stomach/Bowel: No obstruction. Innumerable colonic diverticula distally. No superimposed inflammation. Vascular/Lymphatic: No acute vascular abnormality. Extensive atheromatous calcification of the aorta and iliacs. No mass or adenopathy. Reproductive:No pathologic findings. Other: No ascites or pneumoperitoneum. Musculoskeletal: No acute abnormalities. No acute or  aggressive finding IMPRESSION: 1. No acute finding. Colonic diverticulosis without visible diverticulitis. 2. Numerous chronic findings including atherosclerosis, large hiatal hernia, probable hepatic steatosis, and severe left renal atrophy. Electronically Signed   By: JJorje GuildM.D.   On: 04/10/2022 04:30     Data Reviewed: Relevant notes from primary care and specialist visits, past discharge summaries as available in EHR, including Care Everywhere. Prior diagnostic testing as pertinent to current admission diagnoses  Updated medications and problem lists for reconciliation ED course, including vitals, labs, imaging, treatment and response to treatment Triage notes, nursing and pharmacy notes and ED provider's notes Notable results as noted in HPI   Assessment and Plan: * Alcoholic ketoacidosis Patient with anion gap of 17 bicarb of 14 in the setting of alcohol use disorder Expecting correction with IV hydration Continue to monitor  Left upper quadrant abdominal pain Elevated lipase CT nonacute showing diverticulosis without diverticulitis Clear liquid diet Pain control Antiemetics, IV fluids and supportive care  Alcohol use disorder CIWA withdrawal protocol as EtOH level was less than 10 patient has prior admission for withdrawal  Essential hypertension Continue amlodipine  Hypomagnesemia Magnesium 1.2 IV repletion initiated in the ED.  Continue to monitor        DVT prophylaxis: Lovenox  Consults: none  Advance Care Planning:   Code Status: Prior   Family Communication: none  Disposition Plan: Back to previous home environment  Severity of Illness: The appropriate patient status for this patient is OBSERVATION. Observation status is judged to be reasonable and necessary in order to provide the required intensity of service to ensure the patient's safety. The patient's presenting symptoms, physical exam findings, and initial radiographic and laboratory  data in the context of their medical condition is felt to place them at decreased risk for further clinical deterioration. Furthermore, it is anticipated that the patient will be medically stable for discharge from the hospital within 2 midnights of admission.   Author: Athena Masse, MD 04/10/2022 5:13 AM  For on call review [email or web address].

## 2022-04-10 NOTE — Addendum Note (Signed)
Addended by: Burnard Hawthorne on: 04/10/2022 11:19 AM   Modules accepted: Orders

## 2022-04-10 NOTE — Assessment & Plan Note (Signed)
Magnesium 1.2 IV repletion initiated in the ED.  Continue to monitor

## 2022-04-10 NOTE — Assessment & Plan Note (Signed)
-   Continue amlodipine ?

## 2022-04-10 NOTE — Assessment & Plan Note (Addendum)
Elevated lipase CT nonacute showing diverticulosis without diverticulitis Clear liquid diet Pain control Antiemetics, IV fluids and supportive care

## 2022-04-10 NOTE — Assessment & Plan Note (Signed)
Patient with anion gap of 17 bicarb of 14 in the setting of alcohol use disorder Expecting correction with IV hydration Continue to monitor

## 2022-04-13 ENCOUNTER — Telehealth: Payer: Self-pay | Admitting: Internal Medicine

## 2022-04-13 ENCOUNTER — Telehealth: Payer: Self-pay

## 2022-04-13 ENCOUNTER — Ambulatory Visit (INDEPENDENT_AMBULATORY_CARE_PROVIDER_SITE_OTHER): Payer: Medicare HMO

## 2022-04-13 ENCOUNTER — Other Ambulatory Visit: Payer: Self-pay | Admitting: Internal Medicine

## 2022-04-13 DIAGNOSIS — E538 Deficiency of other specified B group vitamins: Secondary | ICD-10-CM

## 2022-04-13 MED ORDER — ONDANSETRON 8 MG PO TBDP: ORAL_TABLET | ORAL | 0 refills | Status: DC

## 2022-04-13 MED ORDER — CYANOCOBALAMIN 1000 MCG/ML IJ SOLN
1000.0000 ug | Freq: Once | INTRAMUSCULAR | Status: AC
  Administered 2022-04-13: 1000 ug via INTRAMUSCULAR

## 2022-04-13 NOTE — Progress Notes (Signed)
Pt presented for their vitamin B12 injection. Pt was identified through two identifiers. Pt tolerated shot well in their right deltoid.   Pt stated she did not need to get labs done today as she just left the hospital and received labs then.   Pt also wanted me to let her PCP Dr. Derrel Nip know that she needed a refill on ondansetron (ZOFRAN-ODT) 4 MG disintegrating tablet

## 2022-04-13 NOTE — Telephone Encounter (Signed)
Pt need a [facility]

## 2022-04-13 NOTE — Transitions of Care (Post Inpatient/ED Visit) (Signed)
   04/13/2022  Name: Kathy [name] MRN: [id number] DOB: [birth year]  Today's TOC FU Call Status: Today's TOC FU Call Status:: Successful TOC FU Call Competed Unsuccessful Call (1st Attempt) Date: 04/13/22 [facility] FU Call Complete Date: 04/13/22  Transition Care Management Follow-up Telephone Call Date of Discharge: 04/10/22 Discharge Facility: [facility]) Type of Discharge: Inpatient Admission Primary Inpatient Discharge Diagnosis:: LLQ pain How have you been since you were released from the hospital?: Better Any questions or concerns?: No  Items Reviewed: Did you receive and understand the discharge instructions provided?: Yes Medications obtained and verified?: Yes (Medications Reviewed) Any new allergies since your discharge?: No Dietary orders reviewed?: NA Do you have support at home?: Yes People in Home: friend(s)  Home Care and Equipment/Supplies: [place] Ordered?: NA Any new equipment or medical supplies ordered?: NA  Functional Questionnaire: Do you need assistance with bathing/showering or dressing?: No Do you need assistance with meal preparation?: No Do you need assistance with eating?: No Do you have difficulty maintaining continence: No Do you need assistance with getting out of bed/getting out of a chair/moving?: No Do you have difficulty managing or taking your medications?: No  Folllow up appointments reviewed: PCP Follow-up appointment confirmed?: Yes Date of PCP follow-up appointment?: 04/24/22 Follow-up Provider: [facility] Follow-up appointment confirmed?: NA Do you need transportation to your follow-up appointment?: No Do you understand care options if your condition(s) worsen?: Yes-patient verbalized understanding    [place], [place] Nurse Health Advisor Direct Dial [phone]

## 2022-04-13 NOTE — Telephone Encounter (Signed)
Pt called wanting to schedule her next prolia shot

## 2022-04-13 NOTE — Telephone Encounter (Signed)
NOTED

## 2022-04-13 NOTE — Transitions of Care (Post Inpatient/ED Visit) (Signed)
   04/13/2022  Name: Kathy [name] MRN: [id number] DOB: [birth year]  Today's TOC FU Call Status: Today's TOC FU Call Status:: Unsuccessul Call (1st Attempt) Unsuccessful Call (1st Attempt) Date: 04/13/22  Attempted to reach the patient regarding the most recent Inpatient/ED visit.  Follow Up Plan: Additional outreach attempts will be made to reach the patient to complete the Transitions of Care (Post Inpatient/ED visit) call.   Signature Juanda Crumble, [place] Direct Dial [phone]

## 2022-04-13 NOTE — Telephone Encounter (Signed)
Medication refilled

## 2022-04-13 NOTE — Addendum Note (Signed)
Addended by: Leeanne Rio on: 04/13/2022 07:52 AM   Modules accepted: Orders

## 2022-04-16 NOTE — Telephone Encounter (Signed)
I called patient after hearing back from [place] regarding her benefits. PT aware that we need a copy of her new insurance information. Pt stated that she brought it in and it was scanned in but I could not find it. Pt will bring in her card today but I received what I needed over the phone. She now has [facility] & Medicaid. [employer] [id number] Customer Service# [phone].  Pt informed that I will updated info in [employer] portal and to allow 7-10 days to receive information back.

## 2022-04-17 NOTE — Telephone Encounter (Signed)
Patient called today, 04/17/2022. She will bring her [employer] insurance card to the office next Friday, 04/24/22. Patient's insurance has been updated in chart. Patient was advised that office may call to bring card in sooner to process her prolia.

## 2022-04-20 NOTE — Telephone Encounter (Signed)
FYI Kathy [name], you see her for f/u 04/24/22  [facility] GI appt moved up 05/19/2022;  pt had been scheduled in July.

## 2022-04-20 NOTE — Telephone Encounter (Addendum)
Prolia requires PA based on Summary of Benefits, will forward to PA team. Since patient has two insurance there will be nothing collected up front.

## 2022-04-21 ENCOUNTER — Other Ambulatory Visit (HOSPITAL_COMMUNITY): Payer: Self-pay

## 2022-04-21 NOTE — Telephone Encounter (Signed)
Pharmacy Patient Advocate Encounter   Received notification that prior authorization for Prolia '60mg'$  is required/requested.  Per Test Claim: $0 with pharmacy benefit   PA submitted on 04/21/22 to (ins) [employer] Medical benefit via CoverMyMeds Key [id number] Status is pending

## 2022-04-22 NOTE — Telephone Encounter (Signed)
Patient Advocate Encounter  Prior Authorization for Prolia '60mg'$  has been approved.    PA# [id number] Effective dates: 04/22/22 through 02/23/23

## 2022-04-22 NOTE — Telephone Encounter (Signed)
Appt scheduled on 3/7 for Prolia & B12 injection

## 2022-04-24 ENCOUNTER — Ambulatory Visit: Payer: Medicare HMO | Admitting: Internal Medicine

## 2022-04-28 DIAGNOSIS — B351 Tinea unguium: Secondary | ICD-10-CM | POA: Diagnosis not present

## 2022-04-28 DIAGNOSIS — L600 Ingrowing nail: Secondary | ICD-10-CM | POA: Diagnosis not present

## 2022-04-28 DIAGNOSIS — G621 Alcoholic polyneuropathy: Secondary | ICD-10-CM | POA: Diagnosis not present

## 2022-04-30 ENCOUNTER — Ambulatory Visit (INDEPENDENT_AMBULATORY_CARE_PROVIDER_SITE_OTHER): Payer: Medicare HMO

## 2022-04-30 DIAGNOSIS — E538 Deficiency of other specified B group vitamins: Secondary | ICD-10-CM | POA: Diagnosis not present

## 2022-04-30 DIAGNOSIS — M810 Age-related osteoporosis without current pathological fracture: Secondary | ICD-10-CM

## 2022-04-30 MED ORDER — CYANOCOBALAMIN 1000 MCG/ML IJ SOLN
1000.0000 ug | Freq: Once | INTRAMUSCULAR | Status: AC
  Administered 2022-04-30: 1000 ug via INTRAMUSCULAR

## 2022-04-30 MED ORDER — DENOSUMAB 60 MG/ML ~~LOC~~ SOSY
60.0000 mg | PREFILLED_SYRINGE | Freq: Once | SUBCUTANEOUS | Status: AC
  Administered 2022-04-30: 60 mg via SUBCUTANEOUS

## 2022-04-30 NOTE — Progress Notes (Signed)
Patient arrived for her B12 injection. Patient was administered her B12 injection into her left deltoid.   Patient arrived for her Prolia injection which was given in her Right arm. Patient tolerated the B12 & Prolia injections well and did not show any signs of distress or voice any concerns.  Patient also requests a refill on Gabapentin.

## 2022-05-01 ENCOUNTER — Telehealth: Payer: Self-pay | Admitting: Internal Medicine

## 2022-05-01 ENCOUNTER — Other Ambulatory Visit: Payer: Self-pay

## 2022-05-01 MED ORDER — GABAPENTIN 400 MG PO CAPS: 800.0000 mg | ORAL_CAPSULE | Freq: Three times a day (TID) | ORAL | 0 refills | Status: DC

## 2022-05-01 NOTE — Telephone Encounter (Signed)
sent 

## 2022-05-01 NOTE — Telephone Encounter (Signed)
Pt need a refill on [facility]

## 2022-05-11 ENCOUNTER — Other Ambulatory Visit: Payer: Self-pay | Admitting: Internal Medicine

## 2022-05-11 NOTE — Telephone Encounter (Signed)
Refilled: 04/13/2022 Last OV: 03/26/2022 Next OV: not scheduled

## 2022-05-11 NOTE — Telephone Encounter (Signed)
Prescription Request  05/11/2022  LOV: 03/26/2022  ondansetron (ZOFRAN-ODT) 8 MG disintegrating tablet  Patient has an appointment with Gertie Fey on 05/19/22     [place], [place] [address] Phone: [phone] Fax: [phone]      Patient notified that their request is being sent to the clinical staff for review and that they should receive a response within 2 business days.   Please advise at Mobile [phone] (mobile)

## 2022-05-12 MED ORDER — ONDANSETRON 8 MG PO TBDP: ORAL_TABLET | ORAL | 0 refills | Status: DC

## 2022-05-19 DIAGNOSIS — F1010 Alcohol abuse, uncomplicated: Secondary | ICD-10-CM | POA: Diagnosis not present

## 2022-05-19 DIAGNOSIS — R1012 Left upper quadrant pain: Secondary | ICD-10-CM | POA: Diagnosis not present

## 2022-05-19 DIAGNOSIS — R748 Abnormal levels of other serum enzymes: Secondary | ICD-10-CM | POA: Diagnosis not present

## 2022-05-19 DIAGNOSIS — Z86010 Personal history of colonic polyps: Secondary | ICD-10-CM | POA: Diagnosis not present

## 2022-05-26 ENCOUNTER — Ambulatory Visit: Payer: Medicare HMO

## 2022-05-27 ENCOUNTER — Other Ambulatory Visit: Payer: Self-pay | Admitting: Internal Medicine

## 2022-06-01 ENCOUNTER — Ambulatory Visit: Payer: Medicare HMO

## 2022-06-04 ENCOUNTER — Ambulatory Visit (INDEPENDENT_AMBULATORY_CARE_PROVIDER_SITE_OTHER): Payer: Medicare HMO

## 2022-06-04 DIAGNOSIS — E538 Deficiency of other specified B group vitamins: Secondary | ICD-10-CM

## 2022-06-04 MED ORDER — CYANOCOBALAMIN 1000 MCG/ML IJ SOLN
1000.0000 ug | Freq: Once | INTRAMUSCULAR | Status: AC
  Administered 2022-06-04: 1000 ug via INTRAMUSCULAR

## 2022-06-04 NOTE — Progress Notes (Signed)
Patient presented for B 12 injection to left deltoid, patient voiced no concerns nor showed any signs of distress during injection. 

## 2022-06-16 ENCOUNTER — Telehealth: Payer: Self-pay | Admitting: Internal Medicine

## 2022-06-16 NOTE — Telephone Encounter (Signed)
Pt need a refill on [facility]

## 2022-06-16 NOTE — Telephone Encounter (Signed)
Medication has been refilled.

## 2022-06-18 ENCOUNTER — Other Ambulatory Visit: Payer: Self-pay | Admitting: Internal Medicine

## 2022-07-01 ENCOUNTER — Ambulatory Visit (INDEPENDENT_AMBULATORY_CARE_PROVIDER_SITE_OTHER): Payer: Medicare HMO | Admitting: Family

## 2022-07-01 VITALS — BP 118/70 | HR 111 | Temp 98.2°F | Resp 16 | Ht 62.0 in | Wt 101.8 lb

## 2022-07-01 DIAGNOSIS — S51819A Laceration without foreign body of unspecified forearm, initial encounter: Secondary | ICD-10-CM | POA: Diagnosis not present

## 2022-07-01 DIAGNOSIS — R000 Tachycardia, unspecified: Secondary | ICD-10-CM | POA: Diagnosis not present

## 2022-07-01 DIAGNOSIS — Z23 Encounter for immunization: Secondary | ICD-10-CM

## 2022-07-01 DIAGNOSIS — E538 Deficiency of other specified B group vitamins: Secondary | ICD-10-CM

## 2022-07-01 MED ORDER — CYANOCOBALAMIN 1000 MCG/ML IJ SOLN
1000.0000 ug | Freq: Once | INTRAMUSCULAR | Status: AC
  Administered 2022-07-01: 1000 ug via INTRAMUSCULAR

## 2022-07-01 NOTE — Progress Notes (Signed)
Assessment & Plan:  Skin tear of forearm without complication, initial encounter Assessment & Plan: Wound cleaned today with normal saline. No symptoms to suggest infection.  Advised to keep covered; placed clean nonadherent pads over wound.  Close follow-up in 3 days time.  Patient willl remain vigilant in regards to infection. Tdap given.     Tachycardia Assessment & Plan: Reviewed previous heart rate. HR 118 03/26/22 HR 107 12/12/2021  HR 104 10/24/2021  Discussed with patient nicotine could raise heart rate as well as dehydration.  Patient will abstain from smoking a cigarette prior to her follow-up appointment.  She will drink more water.  Consider further workup if persistent.   Need for diphtheria-tetanus-pertussis (Tdap) vaccine -     Tdap vaccine greater than or equal to 7yo IM  B12 deficiency -     Cyanocobalamin     Return precautions given.   Risks, benefits, and alternatives of the medications and treatment plan prescribed today were discussed, and patient expressed understanding.   Education regarding symptom management and diagnosis given to patient on AVS either electronically or printed.  Return in about 2 days (around 07/03/2022).  Kathy Plowman, FNP  Subjective:    Patient ID: Kathy [name], female    DOB: 1961/01/06, 62 y.o.   MRN: [id number]  CC: Kathy [name] is a 62 y.o. female who presents today for follow up.   HPI: Here today for wound to left forearm.   She describes that she 'misstepped' yesterday and hit the asphalt while outside of [employer]. She had 3 vodka cocktails last night.   No head injury, LOC, fever, cp, palpitations.   She fell at [employer].   She states that she is feeling nervous and she smoked a cigarette prior to coming.  She is not on aspirin or blood thinner  H/o alcohol use.  Recent consult with [facility] gastroenterology regarding elevated liver enzymes, abdominal pain.  Colonoscopy/EGD is scheduled.   Discussed ongoing alcohol abuse.  B12 04/30/22  Due tdap , b12    Allergies: Buspirone and Lyrica [pregabalin] Current Outpatient Medications on File Prior to Visit  Medication Sig Dispense Refill   amLODipine (NORVASC) 5 MG tablet TAKE 1 TABLET BY MOUTH TWICE DAILY 180 tablet 1   benzonatate (TESSALON) 100 MG capsule Take 1 capsule (100 mg total) by mouth 3 (three) times daily as needed for cough. 20 capsule 1   budesonide-formoterol (SYMBICORT) 80-4.5 MCG/ACT inhaler Inhale 2 puffs into the lungs 2 (two) times daily. 1 each 3   cyanocobalamin (,VITAMIN B-12,) 1000 MCG/ML injection Inject 1 mL (1,000 mcg total) into the muscle every 14 (fourteen) days. 6 mL 3   cyclobenzaprine (FLEXERIL) 10 MG tablet TAKE 1 TABLET BY MOUTH THREE TIMES DAILYAS NEEDED FOR MUSCLE SPASMS INVOLVING LEGS AND FEET 90 tablet 1   denosumab (PROLIA) 60 MG/ML SOSY injection Inject 60 mg into the skin every 6 (six) months.     gabapentin (NEURONTIN) 400 MG capsule TAKE 2 CAPSULES (800 MG) BY MOUTH 3 TIMES DAILY 180 capsule 11   omeprazole (PRILOSEC) 40 MG capsule TAKE ONE CAPSULE BY MOUTH TWO TIMES DAILY 180 capsule 1   ondansetron (ZOFRAN-ODT) 8 MG disintegrating tablet TAKE ONE TABLET UNDER THE TONGUE EVERY 8HOURS AS NEEDED FOR NAUSEA AND VOMITING 20 tablet 0   pediatric multivitamin + iron (POLY-VI-SOL + IRON) 11 MG/ML SOLN oral solution Take 1 mL by mouth daily. 50 mL 11   Syringe/Needle, Disp, (SYRINGE 3CC/25GX1") 25G X 1" 3  ML MISC Use for b12 injections 50 each 0   thiamine (VITAMIN B1) 100 MG tablet Take 1 tablet (100 mg total) by mouth daily. 30 tablet 1   varenicline (CHANTIX CONTINUING MONTH PAK) 1 MG tablet Take 1 tablet (1 mg total) by mouth 2 (two) times daily. 90 tablet 1   Varenicline Tartrate, Starter, (CHANTIX STARTING MONTH PAK) 0.5 MG X 11 & 1 MG X 42 TBPK Follow the directions on the box 1 each 0   No current facility-administered medications on file prior to visit.    Review of Systems   Constitutional:  Negative for chills and fever.  Respiratory:  Negative for cough.   Cardiovascular:  Negative for chest pain, palpitations and leg swelling.  Gastrointestinal:  Negative for nausea and vomiting.  Skin:  Positive for wound.      Objective:    BP 118/70   Pulse (!) 111   Temp 98.2 F (36.8 C)   Resp 16   Ht 5\' 2"  (1.575 m)   Wt 101 lb 12.8 oz (46.2 kg)   SpO2 97%   BMI 18.62 kg/m  BP Readings from Last 3 Encounters:  07/01/22 118/70  04/10/22 102/60  03/26/22 116/68   Wt Readings from Last 3 Encounters:  07/01/22 101 lb 12.8 oz (46.2 kg)  04/10/22 106 lb (48.1 kg)  03/26/22 101 lb 3.2 oz (45.9 kg)   HR 118 03/26/22 HR 107 12/12/2021  HR 104 10/24/2021    Physical Exam Vitals reviewed.  Constitutional:      Appearance: She is well-developed.  Eyes:     Conjunctiva/sclera: Conjunctivae normal.  Cardiovascular:     Rate and Rhythm: Normal rate and regular rhythm.     Pulses: Normal pulses.     Heart sounds: Normal heart sounds.  Pulmonary:     Effort: Pulmonary effort is normal.     Breath sounds: Normal breath sounds. No wheezing, rhonchi or rales.  Skin:    General: Skin is warm and dry.          Comments: Skin tear of epidermis approximately 8 cm in length left forearm. No purulent discharge, bony step off, joint swelling. No foreign body present.  right red blood dripping from wound  Neurological:     Mental Status: She is alert.  Psychiatric:        Speech: Speech normal.        Behavior: Behavior normal.        Thought Content: Thought content normal.

## 2022-07-01 NOTE — Assessment & Plan Note (Addendum)
Wound cleaned today with normal saline. No symptoms to suggest infection.  Advised to keep covered; placed clean nonadherent pads over wound.  Close follow-up in 3 days time.  Patient willl remain vigilant in regards to infection. Tdap given.

## 2022-07-01 NOTE — Assessment & Plan Note (Signed)
Reviewed previous heart rate. HR 118 03/26/22 HR 107 12/12/2021  HR 104 10/24/2021  Discussed with patient nicotine could raise heart rate as well as dehydration.  Patient will abstain from smoking a cigarette prior to her follow-up appointment.  She will drink more water.  Consider further workup if persistent.

## 2022-07-01 NOTE — Progress Notes (Signed)
Patient presented for B 12 injection to LEFT deltoid, patient voiced no concerns nor showed any signs of distress during injection

## 2022-07-01 NOTE — Patient Instructions (Signed)
Keep wound covered until I see you next.  Please abstain from smoking a cigarette prior to next visit.  I would like to monitor your heart rate more closely

## 2022-07-02 ENCOUNTER — Other Ambulatory Visit: Payer: Self-pay | Admitting: Internal Medicine

## 2022-07-03 ENCOUNTER — Ambulatory Visit (INDEPENDENT_AMBULATORY_CARE_PROVIDER_SITE_OTHER): Payer: Medicare HMO | Admitting: Family

## 2022-07-03 ENCOUNTER — Encounter: Payer: Self-pay | Admitting: Family

## 2022-07-03 VITALS — BP 118/74 | HR 94 | Temp 97.9°F | Ht 62.0 in | Wt 101.6 lb

## 2022-07-03 DIAGNOSIS — F411 Generalized anxiety disorder: Secondary | ICD-10-CM

## 2022-07-03 DIAGNOSIS — R000 Tachycardia, unspecified: Secondary | ICD-10-CM

## 2022-07-03 DIAGNOSIS — S51819A Laceration without foreign body of unspecified forearm, initial encounter: Secondary | ICD-10-CM

## 2022-07-03 DIAGNOSIS — I10 Essential (primary) hypertension: Secondary | ICD-10-CM

## 2022-07-03 DIAGNOSIS — S51819D Laceration without foreign body of unspecified forearm, subsequent encounter: Secondary | ICD-10-CM

## 2022-07-03 MED ORDER — PROPRANOLOL HCL 10 MG PO TABS: 10.0000 mg | ORAL_TABLET | Freq: Two times a day (BID) | ORAL | 1 refills | Status: DC | PRN

## 2022-07-03 NOTE — Assessment & Plan Note (Addendum)
Reassuring neurologic exam.  EKG shows sinus rhythm.  No new ischemic changes when compared to prior 04/10/22.  heart rate initially 105 and came down to 94.  We discussed dehydration, caffeine use, smoking, alcohol use ,anxiety, deconditioning, discomfort with laceration/wound today may contribute.  Palpitations are not associated with chest pain, syncope.  We discussed vascular etiology and I advised baseline ultrasound carotid arteries.  She declines neuroimaging at this time ; and in the absence of a pulsatile tinnitus, will follow closely.  Start propranolol for anxiety.Pending labs which she prefers to have done next week.

## 2022-07-03 NOTE — Patient Instructions (Addendum)
Please continue to keep left arm covered and clean. Please change dressing every 2-3 days.  Stay vigilant for any signs of infection.   I would like for you to decrease the use of caffeine and increase water intake.   Start propranolol  I have ordered an ultrasound of your carotid arteries d  Let us know if you dont hear back within a week in regards to an appointment being scheduled.   So that you are aware, if you are [facility] MyChart user , please pay attention to your MyChart messages as you may receive a MyChart message with a phone number to call and schedule this test/appointment own your own from our referral coordinator. This is a new process so I do not want you to miss this message.  If you are not a MyChart user, you will receive a phone call.    Please let me know how you are doing.

## 2022-07-03 NOTE — Assessment & Plan Note (Signed)
Chronic, stable.  Continue amlodipine 5 mg qd 

## 2022-07-03 NOTE — Assessment & Plan Note (Signed)
Wound was dressed and cleaned today with normal saline.  Wound was dressed prior to my coming into the room there I was unable to reevaluate.  Patient has no concerns for infection at this time.  She will return next week.

## 2022-07-03 NOTE — Progress Notes (Signed)
Assessment & Plan:  GAD (generalized anxiety disorder) Assessment & Plan: Suboptimal control. Previously been on alprazolam, buspirone.  Trial of propranolol 10 to 20 mg twice daily as needed .close follow-up  Orders: -     Propranolol HCl; Take 1-2 tablets (10-20 mg total) by mouth 2 (two) times daily as needed (anxiety).  Dispense: 30 tablet; Refill: 1  Tachycardia Assessment & Plan: Reassuring neurologic exam.  EKG shows sinus rhythm.  No new ischemic changes when compared to prior 04/10/22.  heart rate initially 105 and came down to 94.  We discussed dehydration, caffeine use, smoking, alcohol use ,anxiety, deconditioning, discomfort with laceration/wound today may contribute.  Palpitations are not associated with chest pain, syncope.  We discussed vascular etiology and I advised baseline ultrasound carotid arteries.  She declines neuroimaging at this time ; and in the absence of a pulsatile tinnitus, will follow closely.  Start propranolol for anxiety.Pending labs which she prefers to have done next week.   Orders: -     EKG 12-Lead -     US Carotid Bilateral; Future -     CBC with Differential/Platelet; Future -     Comprehensive metabolic panel; Future -     TSH; Future  Skin tear of forearm without complication, initial encounter Assessment & Plan: Wound was dressed and cleaned today with normal saline.  Wound was dressed prior to my coming into the room there I was unable to reevaluate.  Patient has no concerns for infection at this time.  She will return next week.   Essential hypertension Assessment & Plan: Chronic, stable .  Continue amlodipine 5 mg qd      Return precautions given.   Risks, benefits, and alternatives of the medications and treatment plan prescribed today were discussed, and patient expressed understanding.   Education regarding symptom management and diagnosis given to patient on AVS either electronically or printed.  Return in about 1 week  (around 07/10/2022).  Kathy Plowman, FNP  Subjective:    Patient ID: Kathy [name], female    DOB: February 01, 1961, 62 y.o.   MRN: [id number]  CC: Kathy [name] is a 62 y.o. female who presents today for an acute visit.    HPI: Follow-up left forearm abrasion, tachycardia  Wound is healing.  Scabs are present.  No purulent discharge.  She endorses alcohol abuse.  She smoked a cigarette  4 hours ago   She has felt  'thumping' in her head before. This happens approx once per week however it has not occurred in the past couple of weeks.. No vision loss, HA, hearing loss, syncope, dizziness, CP  Chronic tinnitus  She has h/o vertigo.   She drinks caffeine 32 ounces of caffeine daily.   No sudafed use. No Iv drug use.   No regular exercise.   She doesn't get 'enough exercise'  Describes increased anxiety  over last year she had to leave [employer] and move to the [place]  She has moments where she can "feel more anxious.'    Allergies: Buspirone and Lyrica [pregabalin] Current Outpatient Medications on File Prior to Visit  Medication Sig Dispense Refill   amLODipine (NORVASC) 5 MG tablet TAKE 1 TABLET BY MOUTH TWICE DAILY 180 tablet 1   cyanocobalamin (,VITAMIN B-12,) 1000 MCG/ML injection Inject 1 mL (1,000 mcg total) into the muscle every 14 (fourteen) days. 6 mL 3   cyclobenzaprine (FLEXERIL) 10 MG tablet TAKE 1 TABLET BY MOUTH THREE TIMES DAILYAS NEEDED FOR MUSCLE SPASMS  INVOLVING LEGS AND FEET 90 tablet 1   denosumab (PROLIA) 60 MG/ML SOSY injection Inject 60 mg into the skin every 6 (six) months.     gabapentin (NEURONTIN) 400 MG capsule TAKE 2 CAPSULES (800 MG) BY MOUTH 3 TIMES DAILY 180 capsule 11   omeprazole (PRILOSEC) 40 MG capsule TAKE ONE CAPSULE BY MOUTH TWO TIMES DAILY 180 capsule 1   ondansetron (ZOFRAN-ODT) 8 MG disintegrating tablet TAKE ONE TABLET UNDER THE TONGUE EVERY 8HOURS AS NEEDED FOR NAUSEA AND VOMITING 20 tablet 0   pediatric multivitamin +  iron (POLY-VI-SOL + IRON) 11 MG/ML SOLN oral solution Take 1 mL by mouth daily. 50 mL 11   Syringe/Needle, Disp, (SYRINGE 3CC/25GX1") 25G X 1" 3 ML MISC Use for b12 injections 50 each 0   thiamine (VITAMIN B1) 100 MG tablet Take 1 tablet (100 mg total) by mouth daily. 30 tablet 1   No current facility-administered medications on file prior to visit.    Review of Systems  Constitutional:  Negative for chills and fever.  HENT:  Negative for hearing loss.   Eyes:  Negative for visual disturbance.  Respiratory:  Negative for cough.   Cardiovascular:  Negative for chest pain and palpitations.  Gastrointestinal:  Negative for nausea and vomiting.  Skin:  Positive for wound.  Neurological:  Positive for numbness (peripheral neuropathy). Negative for dizziness, light-headedness and headaches.  Psychiatric/Behavioral:  The patient is nervous/anxious.       Objective:    BP 118/74   Pulse 94   Temp 97.9 F (36.6 C) (Oral)   Ht 5\' 2"  (1.575 m)   Wt 101 lb 9.6 oz (46.1 kg)   SpO2 95%   BMI 18.58 kg/m   BP Readings from Last 3 Encounters:  07/03/22 118/74  07/01/22 118/70  04/10/22 102/60   Wt Readings from Last 3 Encounters:  07/03/22 101 lb 9.6 oz (46.1 kg)  07/01/22 101 lb 12.8 oz (46.2 kg)  04/10/22 106 lb (48.1 kg)    Physical Exam Vitals reviewed.  Constitutional:      Appearance: She is well-developed.  HENT:     Mouth/Throat:     Pharynx: Uvula midline.  Eyes:     Conjunctiva/sclera: Conjunctivae normal.     Pupils: Pupils are equal, round, and reactive to light.     Comments: Fundus normal bilaterally.   Neck:     Vascular: No carotid bruit.  Cardiovascular:     Rate and Rhythm: Normal rate and regular rhythm.     Pulses: Normal pulses.     Heart sounds: Normal heart sounds.  Pulmonary:     Effort: Pulmonary effort is normal.     Breath sounds: Normal breath sounds. No wheezing, rhonchi or rales.  Skin:    General: Skin is warm and dry.  Neurological:      Mental Status: She is alert.     Cranial Nerves: No cranial nerve deficit.     Sensory: No sensory deficit.     Deep Tendon Reflexes:     Reflex Scores:      Bicep reflexes are 2+ on the right side and 2+ on the left side.      Patellar reflexes are 2+ on the right side and 2+ on the left side.    Comments: Grip equal and strong bilateral upper extremities. Gait strong and steady. Able to perform rapid alternating movement without difficulty.   Psychiatric:        Speech: Speech normal.  Behavior: Behavior normal.        Thought Content: Thought content normal.

## 2022-07-03 NOTE — Assessment & Plan Note (Signed)
Suboptimal control. Previously been on alprazolam, buspirone.  Trial of propranolol 10 to 20 mg twice daily as needed .close follow-up

## 2022-07-09 ENCOUNTER — Ambulatory Visit (INDEPENDENT_AMBULATORY_CARE_PROVIDER_SITE_OTHER): Payer: Medicare HMO | Admitting: Internal Medicine

## 2022-07-09 ENCOUNTER — Encounter: Payer: Self-pay | Admitting: Internal Medicine

## 2022-07-09 VITALS — BP 116/80 | HR 108 | Temp 98.3°F | Ht 62.0 in | Wt 103.8 lb

## 2022-07-09 DIAGNOSIS — E871 Hypo-osmolality and hyponatremia: Secondary | ICD-10-CM

## 2022-07-09 DIAGNOSIS — K7010 Alcoholic hepatitis without ascites: Secondary | ICD-10-CM

## 2022-07-09 DIAGNOSIS — S51819A Laceration without foreign body of unspecified forearm, initial encounter: Secondary | ICD-10-CM

## 2022-07-09 DIAGNOSIS — R000 Tachycardia, unspecified: Secondary | ICD-10-CM

## 2022-07-09 DIAGNOSIS — R58 Hemorrhage, not elsewhere classified: Secondary | ICD-10-CM

## 2022-07-09 DIAGNOSIS — D696 Thrombocytopenia, unspecified: Secondary | ICD-10-CM

## 2022-07-09 DIAGNOSIS — E876 Hypokalemia: Secondary | ICD-10-CM

## 2022-07-09 DIAGNOSIS — F1010 Alcohol abuse, uncomplicated: Secondary | ICD-10-CM

## 2022-07-09 DIAGNOSIS — Z1231 Encounter for screening mammogram for malignant neoplasm of breast: Secondary | ICD-10-CM

## 2022-07-09 LAB — CBC WITH DIFFERENTIAL/PLATELET
Basophils Absolute: 0 10*3/uL (ref 0.0–0.1)
Basophils Relative: 0.6 % (ref 0.0–3.0)
Eosinophils Absolute: 0.1 10*3/uL (ref 0.0–0.7)
Eosinophils Relative: 0.9 % (ref 0.0–5.0)
HCT: 38.7 % (ref 36.0–46.0)
Hemoglobin: 13.2 g/dL (ref 12.0–15.0)
Lymphocytes Relative: 47.8 % — ABNORMAL HIGH (ref 12.0–46.0)
Lymphs Abs: 2.7 10*3/uL (ref 0.7–4.0)
MCHC: 34 g/dL (ref 30.0–36.0)
MCV: 107 fl — ABNORMAL HIGH (ref 78.0–100.0)
Monocytes Absolute: 0.6 10*3/uL (ref 0.1–1.0)
Monocytes Relative: 10.3 % (ref 3.0–12.0)
Neutro Abs: 2.3 10*3/uL (ref 1.4–7.7)
Neutrophils Relative %: 40.4 % — ABNORMAL LOW (ref 43.0–77.0)
Platelets: 130 10*3/uL — ABNORMAL LOW (ref 150.0–400.0)
RBC: 3.62 Mil/uL — ABNORMAL LOW (ref 3.87–5.11)
RDW: 15.5 % (ref 11.5–15.5)
WBC: 5.7 10*3/uL (ref 4.0–10.5)

## 2022-07-09 LAB — COMPREHENSIVE METABOLIC PANEL
ALT: 84 U/L — ABNORMAL HIGH (ref 0–35)
AST: 252 U/L — ABNORMAL HIGH (ref 0–37)
Albumin: 3.7 g/dL (ref 3.5–5.2)
Alkaline Phosphatase: 217 U/L — ABNORMAL HIGH (ref 39–117)
BUN: 4 mg/dL — ABNORMAL LOW (ref 6–23)
CO2: 30 mEq/L (ref 19–32)
Calcium: 8.5 mg/dL (ref 8.4–10.5)
Chloride: 93 mEq/L — ABNORMAL LOW (ref 96–112)
Creatinine, Ser: 0.49 mg/dL (ref 0.40–1.20)
GFR: 102.36 mL/min (ref >60.00)
Glucose, Bld: 135 mg/dL — ABNORMAL HIGH (ref 70–99)
Potassium: 3.3 mEq/L — ABNORMAL LOW (ref 3.5–5.1)
Sodium: 136 mEq/L (ref 135–145)
Total Bilirubin: 0.9 mg/dL (ref 0.2–1.2)
Total Protein: 6.6 g/dL (ref 6.0–8.3)

## 2022-07-09 LAB — PROTIME-INR
INR: 1 ratio (ref 0.8–1.0)
Prothrombin Time: 10.4 s (ref 9.6–13.1)

## 2022-07-09 LAB — TSH: TSH: 2.75 u[IU]/mL (ref 0.35–5.50)

## 2022-07-09 LAB — APTT: aPTT: 32.1 s (ref 25.4–36.8)

## 2022-07-09 NOTE — Patient Instructions (Addendum)
Start the propranolol 10 mg  ( 1  tablet) every 8 hours) .    This will slow down your heart rate and help with anxiety   As soon as you are ready to quit drinking.  I will prescribe librium  , BUT YOU CANNOT DRINK ALCOHOL WHEN YOU START THE LIBRIUM   I AM CHECKING YOUR THYROID AND OTHER LABS TODAY

## 2022-07-09 NOTE — Progress Notes (Signed)
Subjective:  Patient ID: Kathy [name], female    DOB: [birth year]  Age: 62 y.o. MRN: [id number]  CC: The primary encounter diagnosis was Encounter for screening mammogram for malignant neoplasm of breast. Diagnoses of Bleeding, Alcohol abuse, Hyponatremia, Tachycardia, Alcoholic hepatitis without ascites, Hypokalemia, Thrombocytopenia (HCC), and Skin tear of forearm without complication, initial encounter were also pertinent to this visit.   HPI Kathy [name] presents for  Chief Complaint  Patient presents with   Medical Management of Chronic Issues   62 yr old alcoholic with ongoing alcohol abuse presents for follow up on wound management :  she sustained a large skin tear on her left forearm that occurred on May 7 when she fell in the [employer] parking lot . She had been drinking earlier in the day.   The wound was evaluated initially on May 8 , again on May 19.  TDap given.  Continues to bleed due to lack of viable tissue and probable coagulaopthy secondary to cirrhosis .    Alcohol abuse:  she resumed alcohol use after losing her job and apartment at the [employer] last year .  She has estranged her mother and daughters an dis living with a roommate who does not drink.   She has a cousin who is also an alcoholic and has offered to pay for online counseling for both of them.    Outpatient Medications Prior to Visit  Medication Sig Dispense Refill   amLODipine (NORVASC) 5 MG tablet TAKE 1 TABLET BY MOUTH TWICE DAILY 180 tablet 1   cyanocobalamin (,VITAMIN B-12,) 1000 MCG/ML injection Inject 1 mL (1,000 mcg total) into the muscle every 14 (fourteen) days. 6 mL 3   cyclobenzaprine (FLEXERIL) 10 MG tablet TAKE 1 TABLET BY MOUTH THREE TIMES DAILYAS NEEDED FOR MUSCLE SPASMS INVOLVING LEGS AND FEET 90 tablet 1   denosumab (PROLIA) 60 MG/ML SOSY injection Inject 60 mg into the skin every 6 (six) months.     gabapentin (NEURONTIN) 400 MG capsule TAKE 2 CAPSULES (800 MG) BY MOUTH 3 TIMES  DAILY 180 capsule 11   omeprazole (PRILOSEC) 40 MG capsule TAKE ONE CAPSULE BY MOUTH TWO TIMES DAILY 180 capsule 1   ondansetron (ZOFRAN-ODT) 8 MG disintegrating tablet TAKE ONE TABLET UNDER THE TONGUE EVERY 8HOURS AS NEEDED FOR NAUSEA AND VOMITING 20 tablet 0   pediatric multivitamin + iron (POLY-VI-SOL + IRON) 11 MG/ML SOLN oral solution Take 1 mL by mouth daily. 50 mL 11   propranolol (INDERAL) 10 MG tablet Take 1-2 tablets (10-20 mg total) by mouth 2 (two) times daily as needed (anxiety). 30 tablet 1   Syringe/Needle, Disp, (SYRINGE 3CC/25GX1") 25G X 1" 3 ML MISC Use for b12 injections 50 each 0   thiamine (VITAMIN B1) 100 MG tablet Take 1 tablet (100 mg total) by mouth daily. 30 tablet 1   No facility-administered medications prior to visit.    Review of Systems;  Patient denies headache, fevers, malaise, unintentional weight loss, skin rash, eye pain, sinus congestion and sinus pain, sore throat, dysphagia,  hemoptysis , cough, dyspnea, wheezing, chest pain, palpitations, orthopnea, edema, abdominal pain, nausea, melena, diarrhea, constipation, flank pain, dysuria, hematuria, urinary  Frequency, nocturia, numbness, tingling, seizures,  Focal weakness, Loss of consciousness,  Tremor, insomnia, depression, anxiety, and suicidal ideation.      Objective:  BP 116/80   Pulse (!) 108   Temp 98.3 F (36.8 C) (Oral)   Ht 5\' 2"  (1.575 m)   Wt 103 lb  12.8 oz (47.1 kg)   SpO2 96%   BMI 18.99 kg/m   BP Readings from Last 3 Encounters:  07/09/22 116/80  07/03/22 118/74  07/01/22 118/70    Wt Readings from Last 3 Encounters:  07/09/22 103 lb 12.8 oz (47.1 kg)  07/03/22 101 lb 9.6 oz (46.1 kg)  07/01/22 101 lb 12.8 oz (46.2 kg)    Physical Exam Vitals reviewed.  Constitutional:      General: She is not in acute distress.    Appearance: Normal appearance. She is normal weight. She is not ill-appearing, toxic-appearing or diaphoretic.  HENT:     Head: Normocephalic.  Eyes:      General: No scleral icterus.       Right eye: No discharge.        Left eye: No discharge.     Conjunctiva/sclera: Conjunctivae normal.  Cardiovascular:     Rate and Rhythm: Normal rate and regular rhythm.     Heart sounds: Normal heart sounds.  Pulmonary:     Effort: Pulmonary effort is normal. No respiratory distress.     Breath sounds: Normal breath sounds.  Musculoskeletal:        General: Normal range of motion.  Skin:    General: Skin is warm and dry.     Findings: Wound present.          Comments: Large skin tear of left forearm with good granulating tissue in the wound bed,  no surrounding erythema   Neurological:     General: No focal deficit present.     Mental Status: She is alert and oriented to person, place, and time. Mental status is at baseline.  Psychiatric:        Mood and Affect: Mood normal.        Behavior: Behavior normal.        Thought Content: Thought content normal.        Judgment: Judgment normal.    Lab Results  Component Value Date   HGBA1C 4.7 05/28/2021   HGBA1C 5.4 10/03/2018   HGBA1C 5.9 02/21/2018    Lab Results  Component Value Date   CREATININE 0.49 07/09/2022   CREATININE 0.47 04/10/2022   CREATININE 0.72 04/10/2022    Lab Results  Component Value Date   WBC 5.7 07/09/2022   HGB 13.2 07/09/2022   HCT 38.7 07/09/2022   PLT 130.0 (L) 07/09/2022   GLUCOSE 135 (H) 07/09/2022   CHOL 187 10/03/2018   TRIG 86 10/03/2018   HDL 95 10/03/2018   LDLDIRECT 119.0 10/11/2014   LDLCALC 75 10/03/2018   ALT 84 (H) 07/09/2022   AST 252 (H) 07/09/2022   NA 136 07/09/2022   K 3.3 (L) 07/09/2022   CL 93 (L) 07/09/2022   CREATININE 0.49 07/09/2022   BUN 4 (L) 07/09/2022   CO2 30 07/09/2022   TSH 2.75 07/09/2022   INR 1.0 07/09/2022   HGBA1C 4.7 05/28/2021    CT Abdomen Pelvis W Contrast  Result Date: 04/10/2022 CLINICAL DATA:  Left lower quadrant pain with nausea and poor appetite EXAM: CT ABDOMEN AND PELVIS WITH CONTRAST TECHNIQUE:  Multidetector CT imaging of the abdomen and pelvis was performed using the standard protocol following bolus administration of intravenous contrast. RADIATION DOSE REDUCTION: This exam was performed according to the departmental dose-optimization program which includes automated exposure control, adjustment of the mA and/or kV according to patient size and/or use of iterative reconstruction technique. CONTRAST:  OMNIPAQUE IOHEXOL 300 MG/ML  SOLN COMPARISON:  03/22/2022 FINDINGS: Lower chest:  Large sliding hiatal hernia, unchanged. Hepatobiliary: There is likely steatosis of the liver but limited by post contrast timing. No focal lesioncholecystectomy which may account for the distended common bile duct from reservoir effect, CBD measuring up to 12 mm above the pancreas. Pancreas: Unremarkable. Spleen: Unremarkable. Adrenals/Urinary Tract: Negative adrenals. No hydronephrosis or stone. Severe left renal atrophy with less distended dysmorphic pelvis since prior. 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 Compensatory hypertrophy of the right kidney. Unremarkable bladder. Stomach/Bowel: No obstruction. Innumerable colonic diverticula distally. No superimposed inflammation. Vascular/Lymphatic: No acute vascular abnormality. Extensive atheromatous calcification of the aorta and iliacs. No mass or adenopathy. Reproductive:No pathologic findings. Other: No ascites or pneumoperitoneum. Musculoskeletal: No acute abnormalities. No acute or aggressive finding IMPRESSION: 1. No acute finding. Colonic diverticulosis without visible diverticulitis. 2. Numerous chronic findings including atherosclerosis, large hiatal hernia, probable hepatic steatosis, and severe left renal atrophy. Electronically Signed   By: Tiburcio Pea M.D.   On: 04/10/2022 04:30    Assessment & Plan:  .Encounter for screening mammogram for malignant neoplasm of breast -     3D Screening Mammogram, Left and Right;  Future  Bleeding -     Protime-INR -     APTT -     CBC with Differential/Platelet  Alcohol abuse -     Ethanol -     Comprehensive metabolic panel  Hyponatremia -     TSH  Tachycardia Assessment & Plan: New onset  (resovled during sobriety post rehab in 2019)  SHE HAS NOT started  propranolol  yet.  Changing direction to 10 mg tid.    Thyroid and other labs ordered. .    Alcoholic hepatitis without ascites Assessment & Plan: liver enzymes are evated because she is still drinking   .  Counselling given  Lab Results  Component Value Date   ALT 84 (H) 07/09/2022   AST 252 (H) 07/09/2022   ALKPHOS 217 (H) 07/09/2022   BILITOT 0.9 07/09/2022      Hypokalemia Assessment & Plan: Replacing.  Recheck in 1-2 week with mg   Thrombocytopenia [facility]) Assessment & Plan: Recurrent,  due to alcoholism  Lab Results  Component Value Date   WBC 5.7 07/09/2022   HGB 13.2 07/09/2022   HCT 38.7 07/09/2022   MCV 107.0 (H) 07/09/2022   PLT 130.0 (L) 07/09/2022       Skin tear of forearm without complication, initial encounter Assessment & Plan: Wound bed is granulating.  Continue nonstick dressing with changes every 3-4 days    Other orders -     Potassium Chloride Crys ER; Take 1 tablet (20 mEq total) by mouth daily.  Dispense: 30 tablet; Refill: 3     I provided of face-to-face time during this encounter reviewing patient's last visit with me, patient's  most recent visit with cardiology,  nephrology,  and neurology,  recent surgical and non surgical procedures, previous  labs and imaging studies, counseling on her ongoig alcohol abuse ,  and post visit ordering to diagnostics and therapeutics .   Follow-up: No follow-ups on file.   Sherlene Shams, MD

## 2022-07-09 NOTE — Assessment & Plan Note (Signed)
New onset  (resovled during sobriety post rehab in 2019)  SHE HAS NOT started  propranolol  yet.  Changing direction to 10 mg tid.    Thyroid and other labs ordered. [name]

## 2022-07-11 ENCOUNTER — Encounter: Payer: Self-pay | Admitting: Internal Medicine

## 2022-07-11 MED ORDER — POTASSIUM CHLORIDE CRYS ER 20 MEQ PO TBCR: 20.0000 meq | EXTENDED_RELEASE_TABLET | Freq: Every day | ORAL | 3 refills | Status: DC

## 2022-07-11 NOTE — Assessment & Plan Note (Signed)
Replacing.  Recheck in 1-2 week with mg

## 2022-07-11 NOTE — Assessment & Plan Note (Signed)
Recurrent,  due to alcoholism  Lab Results  Component Value Date   WBC 5.7 07/09/2022   HGB 13.2 07/09/2022   HCT 38.7 07/09/2022   MCV 107.0 (H) 07/09/2022   PLT 130.0 (L) 07/09/2022

## 2022-07-11 NOTE — Assessment & Plan Note (Signed)
Wound bed is granulating.  Continue nonstick dressing with changes every 3-4 days

## 2022-07-11 NOTE — Assessment & Plan Note (Addendum)
liver enzymes are evated because she is still drinking   .  Counselling given  Lab Results  Component Value Date   ALT 84 (H) 07/09/2022   AST 252 (H) 07/09/2022   ALKPHOS 217 (H) 07/09/2022   BILITOT 0.9 07/09/2022

## 2022-07-13 ENCOUNTER — Ambulatory Visit (INDEPENDENT_AMBULATORY_CARE_PROVIDER_SITE_OTHER): Payer: Medicare HMO

## 2022-07-13 DIAGNOSIS — S51819A Laceration without foreign body of unspecified forearm, initial encounter: Secondary | ICD-10-CM

## 2022-07-13 MED ORDER — ONDANSETRON 8 MG PO TBDP: ORAL_TABLET | ORAL | 0 refills | Status: DC

## 2022-07-13 NOTE — Progress Notes (Cosign Needed)
Nurse visit for dressing change per verbal orders of Dr. Darrick Huntsman.  Dressing removed from left forearm, sterile saline used to loosen non-adherent dressing.  Small amount of dried serious sanguineous drainage present.  Wound cleansed with sterile water and patted dry.  Granulation tissue present in wound bed and no signs of maceration in wound boarder.  No signs of infection.  Applied petroleum guaze to wound and covered with clear occlusive dressing, wrapped kerlex for padding and to protect against bumping the wound.  Instructed the patient that she can shower with the clear occlusive dressing as long as its intact.  Patient tolerated procedure well, did complain of some discomfort when removing the old dressing.  Pt has an appointment 07/21/22 for the wound to be re-dressed.  Pt reported that she needed a refill on Ondansetron 8mg  while here for nursing visit.  Refill sent to [facility].     I have reviewed the above information and agree with above.   Duncan Dull, MD

## 2022-07-14 LAB — ETHANOL: Ethanol: 0.219 %

## 2022-07-21 ENCOUNTER — Ambulatory Visit (INDEPENDENT_AMBULATORY_CARE_PROVIDER_SITE_OTHER): Payer: Medicare HMO

## 2022-07-21 DIAGNOSIS — E538 Deficiency of other specified B group vitamins: Secondary | ICD-10-CM

## 2022-07-21 MED ORDER — CYANOCOBALAMIN 1000 MCG/ML IJ SOLN
1000.0000 ug | Freq: Once | INTRAMUSCULAR | Status: AC
  Administered 2022-07-21: 1000 ug via INTRAMUSCULAR

## 2022-07-21 NOTE — Progress Notes (Addendum)
Patient in office for nurse visit for B-12 injection and dressing change to skin tear on left forearm per verbal orders from Dr. Darrick Huntsman.     After obtaining consent, and per orders of Dr. Darrick Huntsman, injection of B-12 given IM in left deltoid by Valentino Nose. Patient tolerated injection well.   Skin tear located on left forearm removed.  Area cleansed with normal saline and patted dry.  Wound is now closed but skin fragile.  Applied petroleum gauze and covered with transparent dressing for protection of new skin. Pt tolerated dressing change well.     I have reviewed the above information and agree with above.   Duncan Dull, MD

## 2022-07-22 ENCOUNTER — Telehealth: Payer: Self-pay

## 2022-07-22 NOTE — Telephone Encounter (Signed)
Prescription Request  07/22/2022  LOV: Visit date not found  What is the name of the medication or equipment?  gabapentin (NEURONTIN) 400 MG capsule,  ondansetron (ZOFRAN-ODT) 8 MG disintegrating tablet     Have you contacted your pharmacy to request a refill? No   Which pharmacy would you like this sent to?   [facility] - [place], [place] - [address] [place] [address] Phone: [phone] Fax: [phone]   Patient notified that their request is being sent to the clinical staff for review and that they should receive a response within 2 business days.   Please advise at Mobile [phone] (mobile)  Patient states she only has three Zofran left.  Patient states they don't give her very many at the time and she takes them every eight hours.  Patient states she would like to know if it would be possible to increase the amout she is getting per prescription.

## 2022-07-22 NOTE — Telephone Encounter (Signed)
No,  I will not increase the quantity of  zofran because if she is needing to use it three times daily  every day  avoid nausea she is causing it from drinking alcohol l which is causing her gastritis .   The patient has 11 refills on gabapentin!  Why is she requesting a refill from us?>

## 2022-07-22 NOTE — Telephone Encounter (Signed)
Pt is aware and gave a verbal understanding.  

## 2022-08-07 ENCOUNTER — Ambulatory Visit (INDEPENDENT_AMBULATORY_CARE_PROVIDER_SITE_OTHER): Payer: Medicare HMO

## 2022-08-07 DIAGNOSIS — E538 Deficiency of other specified B group vitamins: Secondary | ICD-10-CM | POA: Diagnosis not present

## 2022-08-07 MED ORDER — CYANOCOBALAMIN 1000 MCG/ML IJ SOLN
1000.0000 ug | Freq: Once | INTRAMUSCULAR | Status: AC
  Administered 2022-08-07: 1000 ug via INTRAMUSCULAR

## 2022-08-07 NOTE — Progress Notes (Signed)
Patient arrived for a B12 injection and it was administered into her right deltoid. Patient tolerated the injection well and did not show any signs of distress or voice any concerns. 

## 2022-08-19 ENCOUNTER — Encounter: Payer: Self-pay | Admitting: *Deleted

## 2022-08-24 ENCOUNTER — Encounter: Payer: Self-pay | Admitting: *Deleted

## 2022-08-28 ENCOUNTER — Other Ambulatory Visit: Payer: Self-pay | Admitting: Internal Medicine

## 2022-08-28 NOTE — Telephone Encounter (Signed)
Refilled: 07/13/2022 Last OV: 07/09/2022 Next OV: not scheduled

## 2022-08-31 ENCOUNTER — Other Ambulatory Visit: Payer: Self-pay

## 2022-08-31 ENCOUNTER — Ambulatory Visit: Payer: Medicare HMO | Admitting: Anesthesiology

## 2022-08-31 ENCOUNTER — Encounter: Payer: Self-pay | Admitting: *Deleted

## 2022-08-31 ENCOUNTER — Encounter
Admission: RE | Disposition: A | Discharge status: Home / Self Care | Payer: Self-pay | Source: Home / Self Care | Attending: Gastroenterology

## 2022-08-31 ENCOUNTER — Ambulatory Visit
Admission: RE | Admit: 2022-08-31 | Discharge: 2022-08-31 | Disposition: A | Discharge status: Home / Self Care | Payer: Medicare HMO | Attending: Gastroenterology | Admitting: Gastroenterology

## 2022-08-31 DIAGNOSIS — F17210 Nicotine dependence, cigarettes, uncomplicated: Secondary | ICD-10-CM | POA: Diagnosis not present

## 2022-08-31 DIAGNOSIS — I700 Atherosclerosis of aorta: Secondary | ICD-10-CM | POA: Diagnosis not present

## 2022-08-31 DIAGNOSIS — Z79899 Other long term (current) drug therapy: Secondary | ICD-10-CM | POA: Insufficient documentation

## 2022-08-31 DIAGNOSIS — K219 Gastro-esophageal reflux disease without esophagitis: Secondary | ICD-10-CM | POA: Diagnosis not present

## 2022-08-31 DIAGNOSIS — Z86010 Personal history of colonic polyps: Secondary | ICD-10-CM | POA: Diagnosis not present

## 2022-08-31 DIAGNOSIS — K760 Fatty (change of) liver, not elsewhere classified: Secondary | ICD-10-CM | POA: Diagnosis not present

## 2022-08-31 DIAGNOSIS — Z9049 Acquired absence of other specified parts of digestive tract: Secondary | ICD-10-CM | POA: Insufficient documentation

## 2022-08-31 DIAGNOSIS — K759 Inflammatory liver disease, unspecified: Secondary | ICD-10-CM | POA: Diagnosis not present

## 2022-08-31 DIAGNOSIS — F32A Depression, unspecified: Secondary | ICD-10-CM | POA: Diagnosis not present

## 2022-08-31 DIAGNOSIS — K3189 Other diseases of stomach and duodenum: Secondary | ICD-10-CM | POA: Diagnosis not present

## 2022-08-31 DIAGNOSIS — D123 Benign neoplasm of transverse colon: Secondary | ICD-10-CM | POA: Diagnosis not present

## 2022-08-31 DIAGNOSIS — K449 Diaphragmatic hernia without obstruction or gangrene: Secondary | ICD-10-CM | POA: Insufficient documentation

## 2022-08-31 DIAGNOSIS — I10 Essential (primary) hypertension: Secondary | ICD-10-CM | POA: Diagnosis not present

## 2022-08-31 DIAGNOSIS — K635 Polyp of colon: Secondary | ICD-10-CM | POA: Diagnosis not present

## 2022-08-31 DIAGNOSIS — K5730 Diverticulosis of large intestine without perforation or abscess without bleeding: Secondary | ICD-10-CM | POA: Insufficient documentation

## 2022-08-31 DIAGNOSIS — G629 Polyneuropathy, unspecified: Secondary | ICD-10-CM | POA: Insufficient documentation

## 2022-08-31 DIAGNOSIS — J449 Chronic obstructive pulmonary disease, unspecified: Secondary | ICD-10-CM | POA: Diagnosis not present

## 2022-08-31 DIAGNOSIS — F419 Anxiety disorder, unspecified: Secondary | ICD-10-CM | POA: Diagnosis not present

## 2022-08-31 DIAGNOSIS — Z1211 Encounter for screening for malignant neoplasm of colon: Secondary | ICD-10-CM | POA: Insufficient documentation

## 2022-08-31 DIAGNOSIS — Z09 Encounter for follow-up examination after completed treatment for conditions other than malignant neoplasm: Secondary | ICD-10-CM | POA: Diagnosis not present

## 2022-08-31 DIAGNOSIS — K2950 Unspecified chronic gastritis without bleeding: Secondary | ICD-10-CM | POA: Insufficient documentation

## 2022-08-31 DIAGNOSIS — R1012 Left upper quadrant pain: Secondary | ICD-10-CM | POA: Insufficient documentation

## 2022-08-31 DIAGNOSIS — K5790 Diverticulosis of intestine, part unspecified, without perforation or abscess without bleeding: Secondary | ICD-10-CM | POA: Diagnosis not present

## 2022-08-31 DIAGNOSIS — F17200 Nicotine dependence, unspecified, uncomplicated: Secondary | ICD-10-CM | POA: Diagnosis not present

## 2022-08-31 HISTORY — PX: POLYPECTOMY: SHX5525

## 2022-08-31 HISTORY — DX: Myoneural disorder, unspecified: G70.9

## 2022-08-31 HISTORY — PX: BIOPSY: SHX5522

## 2022-08-31 HISTORY — DX: Thrombocytopenia, unspecified: D69.6

## 2022-08-31 HISTORY — PX: ESOPHAGOGASTRODUODENOSCOPY (EGD) WITH PROPOFOL: SHX5813

## 2022-08-31 HISTORY — DX: Left upper quadrant pain: R10.12

## 2022-08-31 HISTORY — DX: Inflammatory liver disease, unspecified: K75.9

## 2022-08-31 HISTORY — DX: Allergic rhinitis, unspecified: J30.9

## 2022-08-31 HISTORY — DX: Atherosclerosis of aorta: I70.0

## 2022-08-31 HISTORY — DX: Abnormal levels of other serum enzymes: R74.8

## 2022-08-31 HISTORY — DX: Bronchitis, not specified as acute or chronic: J40

## 2022-08-31 HISTORY — PX: COLONOSCOPY WITH PROPOFOL: SHX5780

## 2022-08-31 SURGERY — COLONOSCOPY WITH PROPOFOL
Anesthesia: General

## 2022-08-31 MED ORDER — LIDOCAINE HCL (PF) 2 % IJ SOLN
INTRAMUSCULAR | Status: AC
  Filled 2022-08-31: qty 5

## 2022-08-31 MED ORDER — LIDOCAINE HCL (CARDIAC) PF 100 MG/5ML IV SOSY
PREFILLED_SYRINGE | INTRAVENOUS | Status: DC | PRN
  Administered 2022-08-31: 50 mg via INTRAVENOUS

## 2022-08-31 MED ORDER — PROPOFOL 500 MG/50ML IV EMUL
INTRAVENOUS | Status: DC | PRN
  Administered 2022-08-31: 100 ug/kg/min via INTRAVENOUS

## 2022-08-31 MED ORDER — SODIUM CHLORIDE 0.9 % IV SOLN: INTRAVENOUS | Status: DC

## 2022-08-31 MED ORDER — PROPOFOL 1000 MG/100ML IV EMUL
INTRAVENOUS | Status: AC
  Filled 2022-08-31: qty 100

## 2022-08-31 MED ORDER — DEXMEDETOMIDINE HCL IN NACL 80 MCG/20ML IV SOLN
INTRAVENOUS | Status: DC | PRN
  Administered 2022-08-31: 4 ug via INTRAVENOUS

## 2022-08-31 MED ORDER — PROPOFOL 10 MG/ML IV BOLUS
INTRAVENOUS | Status: DC | PRN
  Administered 2022-08-31: 40 mg via INTRAVENOUS
  Administered 2022-08-31 (×2): 30 mg via INTRAVENOUS

## 2022-08-31 NOTE — Transfer of Care (Signed)
Immediate Anesthesia Transfer of Care Note  Patient: Kathy [name]  Procedure(s) Performed: COLONOSCOPY WITH PROPOFOL ESOPHAGOGASTRODUODENOSCOPY (EGD) WITH PROPOFOL BIOPSY  Patient Location: Endoscopy Unit  Anesthesia Type:General  Level of Consciousness: awake, alert , and oriented  Airway & Oxygen Therapy: Patient Spontanous Breathing  Post-op Assessment: Report given to RN and Post -op Vital signs reviewed and stable  Post vital signs: Reviewed and stable  Last Vitals:  Vitals Value Taken Time  BP 117/77 08/31/22 0827  Temp 36.2 C 08/31/22 0827  Pulse 94 08/31/22 0827  Resp 21 08/31/22 0827  SpO2 100 % 08/31/22 0827    Last Pain:  Vitals:   08/31/22 0827  TempSrc: Temporal  PainSc:          Complications: No notable events documented.

## 2022-08-31 NOTE — Interval H&P Note (Signed)
History and Physical Interval Note:  08/31/2022 7:50 AM  Kathy [name]  has presented today for surgery, with the diagnosis of ABDOMINAL PAIN,HX OF COLON POLYPS.  The various methods of treatment have been discussed with the patient and family. After consideration of risks, benefits and other options for treatment, the patient has consented to  Procedure(s): COLONOSCOPY WITH PROPOFOL (N/A) ESOPHAGOGASTRODUODENOSCOPY (EGD) WITH PROPOFOL (N/A) as a surgical intervention.  The patient's history has been reviewed, patient examined, no change in status, stable for surgery.  I have reviewed the patient's chart and labs.  Questions were answered to the patient's satisfaction.     Regis Bill  Ok to proceed with EGD/Colonoscopy

## 2022-08-31 NOTE — Anesthesia Preprocedure Evaluation (Signed)
Anesthesia Evaluation  Patient identified by MRN, date of birth, ID band Patient awake    Reviewed: Allergy & Precautions, H&P , NPO status , Patient's Chart, lab work & pertinent test results, reviewed documented beta blocker date and time   Airway Mallampati: II   Neck ROM: full    Dental  (+) Poor Dentition   Pulmonary shortness of breath, COPD, Current Smoker   Pulmonary exam normal        Cardiovascular Exercise Tolerance: Good hypertension, On Medications Normal cardiovascular exam Rhythm:regular Rate:Normal     Neuro/Psych  PSYCHIATRIC DISORDERS Anxiety Depression     Neuromuscular disease    GI/Hepatic hiatal hernia,GERD  ,,(+) Hepatitis -  Endo/Other  negative endocrine ROS    Renal/GU Renal disease  negative genitourinary   Musculoskeletal   Abdominal   Peds  Hematology  (+) Blood dyscrasia, anemia   Anesthesia Other Findings Past Medical History: No date: Abdominal aortic atherosclerosis (HCC) No date: Abnormal weight loss No date: Alcohol abuse 10/13/2020: Alcohol abuse with withdrawal (HCC) No date: Allergic rhinitis No date: Anxiety No date: Bronchitis No date: Chicken pox No date: Colon polyps No date: COPD (chronic obstructive pulmonary disease) (HCC) No date: Depression No date: Dyspnea No date: Elevated lipase No date: Fatty liver 09/09/2020: Gallbladder mass     Comment:  8 x 6 x 6 mm non mobile  mass along GB wall noted on               2018 [place] 8 x  7 x 5 mm non mobile mass along GB wall noted              on July  2022 [place] No date: GERD (gastroesophageal reflux disease) No date: Hepatitis No date: Hernia of abdominal cavity right: Hip fracture (HCC) No date: Left upper quadrant abdominal pain No date: Neuromuscular disorder (HCC) No date: Neuropathy     Comment:  lower legs No date: Thrombocytopenia (HCC) No date: Wears dentures     Comment:  full upper and lower Past Surgical  History: 07/29/2021: CATARACT EXTRACTION W/PHACO; Right     Comment:  Procedure: CATARACT EXTRACTION PHACO AND INTRAOCULAR               LENS PLACEMENT (IOC) RIGHT 19.12 01:27.4;  Surgeon:               Galen Manila, MD;  Location: [facility];                Service: Ophthalmology;  Laterality: Right; 08/12/2021: CATARACT EXTRACTION W/PHACO; Left     Comment:  Procedure: CATARACT EXTRACTION PHACO AND INTRAOCULAR               LENS PLACEMENT (IOC) LEFT 13.10 01:05.3;  Surgeon:               Galen Manila, MD;  Location: [facility];                Service: Ophthalmology;  Laterality: Left; No date: CHOLECYSTECTOMY No date: COLONOSCOPY No date: EYE SURGERY 04/16/2021: ROBOTIC ASSISTED LAPAROSCOPIC CHOLECYSTECTOMY     Comment:  [facility]   Reproductive/Obstetrics negative OB ROS                             Anesthesia Physical Anesthesia Plan  ASA: 3  Anesthesia Plan: General   Post-op Pain Management:    Induction:   PONV Risk Score and Plan:  Airway Management Planned:   Additional Equipment:   Intra-op Plan:   Post-operative Plan:   Informed Consent: I have reviewed the patients History and Physical, chart, labs and discussed the procedure including the risks, benefits and alternatives for the proposed anesthesia with the patient or authorized representative who has indicated his/her understanding and acceptance.     Dental Advisory Given  Plan Discussed with: CRNA  Anesthesia Plan Comments:        Anesthesia Quick Evaluation

## 2022-08-31 NOTE — Op Note (Signed)
[facility] Gastroenterology Patient Name: Kathy [name] Procedure Date: 08/31/2022 7:50 AM MRN: [id number] Account #: [id number] Date of Birth: 1960-04-23 Admit Type: Outpatient Age: 62 Room: [facility] ENDO ROOM 3 Gender: Female Note Status: Finalized Instrument Name: Patton Salles Endoscope [id number] Procedure:             Upper GI endoscopy Indications:           Abdominal pain in the left upper quadrant Providers:             Eather Colas MD, MD Referring MD:          Duncan Dull, MD (Referring MD) Medicines:             Monitored Anesthesia Care Complications:         No immediate complications. Estimated blood loss:                         Minimal. Procedure:             Pre-Anesthesia Assessment:                        - Prior to the procedure, a History and Physical was                         performed, and patient medications and allergies were                         reviewed. The patient is competent. The risks and                         benefits of the procedure and the sedation options and                         risks were discussed with the patient. All questions                         were answered and informed consent was obtained.                         Patient identification and proposed procedure were                         verified by the physician, the nurse, the                         anesthesiologist, the anesthetist and the technician                         in the endoscopy suite. Mental Status Examination:                         alert and oriented. Airway Examination: normal                         oropharyngeal airway and neck mobility. Respiratory                         Examination: clear to auscultation. CV Examination:  normal. Prophylactic Antibiotics: The patient does not                         require prophylactic antibiotics. Prior                         Anticoagulants: The patient has taken no  anticoagulant                         or antiplatelet agents. ASA Grade Assessment: III - A                         patient with severe systemic disease. After reviewing                         the risks and benefits, the patient was deemed in                         satisfactory condition to undergo the procedure. The                         anesthesia plan was to use monitored anesthesia care                         (MAC). Immediately prior to administration of                         medications, the patient was re-assessed for adequacy                         to receive sedatives. The heart rate, respiratory                         rate, oxygen saturations, blood pressure, adequacy of                         pulmonary ventilation, and response to care were                         monitored throughout the procedure. The physical                         status of the patient was re-assessed after the                         procedure.                        After obtaining informed consent, the endoscope was                         passed under direct vision. Throughout the procedure,                         the patient's blood pressure, pulse, and oxygen                         saturations were monitored continuously. The Endoscope  was introduced through the mouth, and advanced to the                         second part of duodenum. The upper GI endoscopy was                         accomplished without difficulty. The patient tolerated                         the procedure well. Findings:      A 7 cm hiatal hernia was present.      Patchy moderate inflammation characterized by erythema was found in the       gastric antrum. Biopsies were taken with a cold forceps for histology.       Estimated blood loss was minimal.      The examined duodenum was normal. Impression:            - 7 cm hiatal hernia.                        - Gastritis. Biopsied.                         - Normal examined duodenum. Recommendation:        - Discharge patient to home.                        - Resume previous diet.                        - Continue present medications.                        - Await pathology results.                        - Return to referring physician as previously                         scheduled. Procedure Code(s):     --- Professional ---                        [id number], Esophagogastroduodenoscopy, flexible,                         transoral; with biopsy, single or multiple Diagnosis Code(s):     --- Professional ---                        K44.9, Diaphragmatic hernia without obstruction or                         gangrene                        K29.70, Gastritis, unspecified, without bleeding                        R10.12, Left upper quadrant pain CPT copyright 2022 American Medical Association. All rights reserved. The codes documented in this report are preliminary and upon coder review may  be revised to meet current compliance requirements. Sheria Lang  Neco Kling MD, MD 08/31/2022 8:27:24 AM Number of Addenda: 0 Note Initiated On: 08/31/2022 7:50 AM Estimated Blood Loss:  Estimated blood loss was minimal.      [facility]

## 2022-08-31 NOTE — Op Note (Signed)
[facility] Patient Name: Kathy [name] Procedure Date: 08/31/2022 7:49 AM MRN: [id number] Account #: [id number] Date of Birth: 07/20/1960 Admit Type: Outpatient Age: 62 Room: [facility] ENDO ROOM 3 Gender: Female Note Status: Supervisor Override Instrument Name: Peds Colonoscope [id number] Procedure:             Colonoscopy Indications:           High risk colon cancer surveillance: Personal history                         of colonic polyps Providers:             Eather Colas MD, MD Referring MD:          Duncan Dull, MD (Referring MD) Medicines:             Monitored Anesthesia Care Complications:         No immediate complications. Estimated blood loss:                         Minimal. Procedure:             Pre-Anesthesia Assessment:                        - Prior to the procedure, a History and Physical was                         performed, and patient medications and allergies were                         reviewed. The patient is competent. The risks and                         benefits of the procedure and the sedation options and                         risks were discussed with the patient. All questions                         were answered and informed consent was obtained.                         Patient identification and proposed procedure were                         verified by the physician, the nurse, the                         anesthesiologist, the anesthetist and the technician                         in the endoscopy suite. Mental Status Examination:                         alert and oriented. Airway Examination: normal                         oropharyngeal airway and neck mobility. Respiratory  Examination: clear to auscultation. CV Examination:                         normal. Prophylactic Antibiotics: The patient does not                         require prophylactic antibiotics. Prior                          Anticoagulants: The patient has taken no anticoagulant                         or antiplatelet agents. ASA Grade Assessment: III - A                         patient with severe systemic disease. After reviewing                         the risks and benefits, the patient was deemed in                         satisfactory condition to undergo the procedure. The                         anesthesia plan was to use monitored anesthesia care                         (MAC). Immediately prior to administration of                         medications, the patient was re-assessed for adequacy                         to receive sedatives. The heart rate, respiratory                         rate, oxygen saturations, blood pressure, adequacy of                         pulmonary ventilation, and response to care were                         monitored throughout the procedure. The physical                         status of the patient was re-assessed after the                         procedure.                        After obtaining informed consent, the colonoscope was                         passed under direct vision. Throughout the procedure,                         the patient's blood pressure, pulse, and oxygen  saturations were monitored continuously. The                         Colonoscope was introduced through the anus and                         advanced to the the terminal ileum. The colonoscopy                         was somewhat difficult due to multiple diverticula in                         the colon. The patient tolerated the procedure well.                         The quality of the bowel preparation was adequate to                         identify polyps. The terminal ileum, ileocecal valve,                         appendiceal orifice, and rectum were photographed. Findings:      The perianal and digital rectal examinations were normal.      The terminal ileum  appeared normal.      A 10 mm polyp was found in the distal transverse colon. The polyp was       sessile. The polyp was removed with a cold snare. Resection and       retrieval were complete. Estimated blood loss was minimal.      Many large-mouthed and small-mouthed diverticula were found in the       sigmoid colon and descending colon.      The exam was otherwise without abnormality on direct and retroflexion       views. Impression:            - The examined portion of the ileum was normal.                        - One 10 mm polyp in the distal transverse colon,                         removed with a cold snare. Resected and retrieved.                        - Diverticulosis in the sigmoid colon and in the                         descending colon.                        - The examination was otherwise normal on direct and                         retroflexion views. Recommendation:        - Discharge patient to home.                        - Resume previous diet.                        -  Continue present medications.                        - Await pathology results.                        - Repeat colonoscopy in 3 years for surveillance.                        - Return to referring physician as previously                         scheduled. Procedure Code(s):     --- Professional ---                        [phone], Colonoscopy, flexible; with removal of                         tumor(s), polyp(s), or other lesion(s) by snare                         technique Diagnosis Code(s):     --- Professional ---                        D12.3, Benign neoplasm of transverse colon (hepatic                         flexure or splenic flexure)                        R10.12, Left upper quadrant pain                        K57.30, Diverticulosis of large intestine without                         perforation or abscess without bleeding CPT copyright 2022 American Medical Association. All rights  reserved. The codes documented in this report are preliminary and upon coder review may  be revised to meet current compliance requirements. Eather Colas MD, MD 08/31/2022 8:30:19 AM Number of Addenda: 0 Note Initiated On: 08/31/2022 7:49 AM Scope Withdrawal Time: 0 hours 10 minutes 11 seconds  Total Procedure Duration: 0 hours 16 minutes 1 second  Estimated Blood Loss:  Estimated blood loss was minimal.      [facility]

## 2022-08-31 NOTE — H&P (Signed)
Outpatient short stay form Pre-procedure 08/31/2022  Kathy Bill, MD  Primary Physician: Sherlene Shams, MD  Reason for visit:  Abdominal pain  History of present illness:    62 y/o lady with history of alcohol abuse, hypertension, and pancreatitis here for EGD/Colonoscopy for abdominal pain. Last EGD/Colonoscopy in 2014 and had a 9 mm TA in the colon. History of cholecystectomy. No first degree relatives with GI malignancies. No blood thinners.    Current Facility-Administered Medications:    0.9 %  sodium chloride infusion, , Intravenous, Continuous, Lazlo Tunney, Rossie Muskrat, MD, Last Rate: 20 mL/hr at 08/31/22 0724, Continued from Pre-op at 08/31/22 0724  Medications Prior to Admission  Medication Sig Dispense Refill Last Dose   acetaminophen (TYLENOL) 500 MG tablet Take 500 mg by mouth every 6 (six) hours as needed.      ALPRAZolam (XANAX) 0.5 MG tablet Take 1 mg by mouth at bedtime as needed for anxiety.      gabapentin (NEURONTIN) 400 MG capsule TAKE 2 CAPSULES (800 MG) BY MOUTH 3 TIMES DAILY 180 capsule 11 Past Week   hydrOXYzine (ATARAX) 10 MG tablet Take 10 mg by mouth 3 (three) times daily as needed.      omeprazole (PRILOSEC) 40 MG capsule TAKE ONE CAPSULE BY MOUTH TWO TIMES DAILY 180 capsule 1 Past Week   traMADol (ULTRAM) 50 MG tablet Take by mouth every 6 (six) hours as needed.      traZODone (DESYREL) 100 MG tablet Take 100 mg by mouth at bedtime.      amLODipine (NORVASC) 5 MG tablet TAKE 1 TABLET BY MOUTH TWICE DAILY (Patient not taking: Reported on 08/31/2022) 180 tablet 1 Not Taking   cyanocobalamin (,VITAMIN B-12,) 1000 MCG/ML injection Inject 1 mL (1,000 mcg total) into the muscle every 14 (fourteen) days. 6 mL 3    cyclobenzaprine (FLEXERIL) 10 MG tablet TAKE 1 TABLET BY MOUTH THREE TIMES DAILYAS NEEDED FOR MUSCLE SPASMS INVOLVING LEGS AND FEET 90 tablet 1    denosumab (PROLIA) 60 MG/ML SOSY injection Inject 60 mg into the skin every 6 (six) months.      DULoxetine  (CYMBALTA) 20 MG capsule Take 20 mg by mouth daily. (Patient not taking: Reported on 08/31/2022)   Not Taking   ondansetron (ZOFRAN-ODT) 8 MG disintegrating tablet TAKE ONE TABLET UNDER THE TONGUE EVERY 8HOURS AS NEEDED FOR NAUSEA AND VOMITING. 20 tablet 0    pediatric multivitamin + iron (POLY-VI-SOL + IRON) 11 MG/ML SOLN oral solution Take 1 mL by mouth daily. 50 mL 11    potassium chloride SA (KLOR-CON M) 20 MEQ tablet Take 1 tablet (20 mEq total) by mouth daily. 30 tablet 3    propranolol (INDERAL) 10 MG tablet Take 1-2 tablets (10-20 mg total) by mouth 2 (two) times daily as needed (anxiety). (Patient not taking: Reported on 08/31/2022) 30 tablet 1 Not Taking   Syringe/Needle, Disp, (SYRINGE 3CC/25GX1") 25G X 1" 3 ML MISC Use for b12 injections 50 each 0    thiamine (VITAMIN B1) 100 MG tablet Take 1 tablet (100 mg total) by mouth daily. 30 tablet 1      Allergies  Allergen Reactions   Buspirone Hives   Lyrica [Pregabalin] Other (See Comments)    Blurred vision      Past Medical History:  Diagnosis Date   Abdominal aortic atherosclerosis (HCC)    Abnormal weight loss    Alcohol abuse    Alcohol abuse with withdrawal (HCC) 10/13/2020   Allergic rhinitis  Anxiety    Bronchitis    Chicken pox    Colon polyps    COPD (chronic obstructive pulmonary disease) (HCC)    Depression    Dyspnea    Elevated lipase    Fatty liver    Gallbladder mass 09/09/2020   8 x 6 x 6 mm non mobile  mass along GB wall noted on 2018 [place] 8 x  7 x 5 mm non mobile mass along GB wall noted on July  2022 [place]   GERD (gastroesophageal reflux disease)    Hepatitis    Hernia of abdominal cavity    Hip fracture (HCC) right   Left upper quadrant abdominal pain    Neuromuscular disorder (HCC)    Neuropathy    lower legs   Thrombocytopenia (HCC)    Wears dentures    full upper and lower    Review of systems:  Otherwise negative.    Physical Exam  Gen: Alert, oriented. Appears stated age.  HEENT:  PERRLA. Lungs: No respiratory distress CV: RRR Abd: soft, benign, no masses Ext: No edema    Planned procedures: Proceed with EGD/colonoscopy. The patient understands the nature of the planned procedure, indications, risks, alternatives and potential complications including but not limited to bleeding, infection, perforation, damage to internal organs and possible oversedation/side effects from anesthesia. The patient agrees and gives consent to proceed.  Please refer to procedure notes for findings, recommendations and patient disposition/instructions.     Kathy Bill, MD [facility]

## 2022-08-31 NOTE — Anesthesia Procedure Notes (Signed)
Date/Time: 08/31/2022 7:50 AM  Performed by: Ginger Carne, CRNAPre-anesthesia Checklist: Patient identified, Emergency Drugs available, Suction available, Patient being monitored and Timeout performed Patient Re-evaluated:Patient Re-evaluated prior to induction Oxygen Delivery Method: Nasal cannula Preoxygenation: Pre-oxygenation with 100% oxygen Induction Type: IV induction

## 2022-09-01 ENCOUNTER — Encounter: Payer: Self-pay | Admitting: Gastroenterology

## 2022-09-15 NOTE — Anesthesia Postprocedure Evaluation (Signed)
Anesthesia Post Note  Patient: Kathy [name]  Procedure(s) Performed: COLONOSCOPY WITH PROPOFOL ESOPHAGOGASTRODUODENOSCOPY (EGD) WITH PROPOFOL BIOPSY POLYPECTOMY  Patient location during evaluation: PACU Anesthesia Type: General Level of consciousness: awake and alert Pain management: pain level controlled Vital Signs Assessment: post-procedure vital signs reviewed and stable Respiratory status: spontaneous breathing, nonlabored ventilation, respiratory function stable and patient connected to nasal cannula oxygen Cardiovascular status: blood pressure returned to baseline and stable Postop Assessment: no apparent nausea or vomiting Anesthetic complications: no   No notable events documented.   Last Vitals:  Vitals:   08/31/22 0827 08/31/22 0837  BP: 117/77 113/81  Pulse: 94 89  Resp: (!) 21 16  Temp: (!) 36.2 C   SpO2: 100% 100%    Last Pain:  Vitals:   08/31/22 0837  TempSrc:   PainSc: 0-No pain                 Yevette Edwards

## 2022-09-29 ENCOUNTER — Ambulatory Visit (INDEPENDENT_AMBULATORY_CARE_PROVIDER_SITE_OTHER): Payer: Medicaid Other | Admitting: *Deleted

## 2022-09-29 VITALS — Ht 62.0 in | Wt 109.0 lb

## 2022-09-29 DIAGNOSIS — Z0000 Encounter for general adult medical examination without abnormal findings: Secondary | ICD-10-CM | POA: Diagnosis not present

## 2022-09-29 NOTE — Patient Instructions (Signed)
Kathy [name] , Thank you for taking time to come for your Medicare Wellness Visit. I appreciate your ongoing commitment to your health goals. Please review the following plan we discussed and let me know if I can assist you in the future.   Referrals/Orders/Follow-Ups/Clinician Recommendations: None  This is a list of the screening recommended for you and due dates:  Health Maintenance  Topic Date Due   Zoster (Shingles) Vaccine (1 of 2) Never done   Screening for Lung Cancer  05/16/2022   Mammogram  07/02/2022   Flu Shot  09/24/2022   Medicare Annual Wellness Visit  09/29/2023   Pap Smear  10/24/2024   Colon Cancer Screening  08/31/2027   DTaP/Tdap/Td vaccine (2 - Td or Tdap) 06/30/2032   Hepatitis C Screening  Completed   HIV Screening  Completed   HPV Vaccine  Aged Out   COVID-19 Vaccine  Discontinued    Advanced directives: (Declined) Advance directive discussed with you today. Even though you declined this today, please call our office should you change your mind, and we can give you the proper paperwork for you to fill out.  Next Medicare Annual Wellness Visit scheduled for next year: No Patient declined stating that she will schedule later  Preventive Care 40-64 Years, Female Preventive care refers to lifestyle choices and visits with your health care provider that can promote health and wellness. What does preventive care include? A yearly physical exam. This is also called an annual well check. Dental exams once or twice a year. Routine eye exams. Ask your health care provider how often you should have your eyes checked. Personal lifestyle choices, including: Daily care of your teeth and gums. Regular physical activity. Eating a healthy diet. Avoiding tobacco and drug use. Limiting alcohol use. Practicing safe sex. Taking low-dose aspirin daily starting at age 62. Taking vitamin and mineral supplements as recommended by your health care provider. What happens during an  annual well check? The services and screenings done by your health care provider during your annual well check will depend on your age, overall health, lifestyle risk factors, and family history of disease. Counseling  Your health care provider may ask you questions about your: Alcohol use. Tobacco use. Drug use. Emotional well-being. Home and relationship well-being. Sexual activity. Eating habits. Work and work Astronomer. Method of birth control. Menstrual cycle. Pregnancy history. Screening  You may have the following tests or measurements: Height, weight, and BMI. Blood pressure. Lipid and cholesterol levels. These may be checked every 5 years, or more frequently if you are over 70 years old. Skin check. Lung cancer screening. You may have this screening every year starting at age 13 if you have a 30-pack-year history of smoking and currently smoke or have quit within the past 15 years. Fecal occult blood test (FOBT) of the stool. You may have this test every year starting at age 84. Flexible sigmoidoscopy or colonoscopy. You may have a sigmoidoscopy every 5 years or a colonoscopy every 10 years starting at age 24. Hepatitis C blood test. Hepatitis B blood test. Sexually transmitted disease (STD) testing. Diabetes screening. This is done by checking your blood sugar (glucose) after you have not eaten for a while (fasting). You may have this done every 1-3 years. Mammogram. This may be done every 1-2 years. Talk to your health care provider about when you should start having regular mammograms. This may depend on whether you have a family history of breast cancer. BRCA-related cancer screening. This may  be done if you have a family history of breast, ovarian, tubal, or peritoneal cancers. Pelvic exam and Pap test. This may be done every 3 years starting at age 78. Starting at age 36, this may be done every 5 years if you have a Pap test in combination with an HPV test. Bone  density scan. This is done to screen for osteoporosis. You may have this scan if you are at high risk for osteoporosis. Discuss your test results, treatment options, and if necessary, the need for more tests with your health care provider. Vaccines  Your health care provider may recommend certain vaccines, such as: Influenza vaccine. This is recommended every year. Tetanus, diphtheria, and acellular pertussis (Tdap, Td) vaccine. You may need a Td booster every 10 years. Zoster vaccine. You may need this after age 52. Pneumococcal 13-valent conjugate (PCV13) vaccine. You may need this if you have certain conditions and were not previously vaccinated. Pneumococcal polysaccharide (PPSV23) vaccine. You may need one or two doses if you smoke cigarettes or if you have certain conditions. Talk to your health care provider about which screenings and vaccines you need and how often you need them. This information is not intended to replace advice given to you by your health care provider. Make sure you discuss any questions you have with your health care provider. Document Released: 03/08/2015 Document Revised: 10/30/2015 Document Reviewed: 12/11/2014 Elsevier Interactive Patient Education  2017 [employer].    Fall Prevention in the Home Falls can cause injuries. They can happen to people of all ages. There are many things you can do to make your home safe and to help prevent falls. What can I do on the outside of my home? Regularly fix the edges of walkways and driveways and fix any cracks. Remove anything that might make you trip as you walk through a door, such as a raised step or threshold. Trim any bushes or trees on the path to your home. Use bright outdoor lighting. Clear any walking paths of anything that might make someone trip, such as rocks or tools. Regularly check to see if handrails are loose or broken. Make sure that both sides of any steps have handrails. Any raised decks and  porches should have guardrails on the edges. Have any leaves, snow, or ice cleared regularly. Use sand or salt on walking paths during winter. Clean up any spills in your garage right away. This includes oil or grease spills. What can I do in the bathroom? Use night lights. Install grab bars by the toilet and in the tub and shower. Do not use towel bars as grab bars. Use non-skid mats or decals in the tub or shower. If you need to sit down in the shower, use a plastic, non-slip stool. Keep the floor dry. Clean up any water that spills on the floor as soon as it happens. Remove soap buildup in the tub or shower regularly. Attach bath mats securely with double-sided non-slip rug tape. Do not have throw rugs and other things on the floor that can make you trip. What can I do in the bedroom? Use night lights. Make sure that you have a light by your bed that is easy to reach. Do not use any sheets or blankets that are too big for your bed. They should not hang down onto the floor. Have a firm chair that has side arms. You can use this for support while you get dressed. Do not have throw rugs and other things  on the floor that can make you trip. What can I do in the kitchen? Clean up any spills right away. Avoid walking on wet floors. Keep items that you use a lot in easy-to-reach places. If you need to reach something above you, use a strong step stool that has a grab bar. Keep electrical cords out of the way. Do not use floor polish or wax that makes floors slippery. If you must use wax, use non-skid floor wax. Do not have throw rugs and other things on the floor that can make you trip. What can I do with my stairs? Do not leave any items on the stairs. Make sure that there are handrails on both sides of the stairs and use them. Fix handrails that are broken or loose. Make sure that handrails are as long as the stairways. Check any carpeting to make sure that it is firmly attached to the  stairs. Fix any carpet that is loose or worn. Avoid having throw rugs at the top or bottom of the stairs. If you do have throw rugs, attach them to the floor with carpet tape. Make sure that you have a light switch at the top of the stairs and the bottom of the stairs. If you do not have them, ask someone to add them for you. What else can I do to help prevent falls? Wear shoes that: Do not have high heels. Have rubber bottoms. Are comfortable and fit you well. Are closed at the toe. Do not wear sandals. If you use a stepladder: Make sure that it is fully opened. Do not climb a closed stepladder. Make sure that both sides of the stepladder are locked into place. Ask someone to hold it for you, if possible. Clearly mark and make sure that you can see: Any grab bars or handrails. First and last steps. Where the edge of each step is. Use tools that help you move around (mobility aids) if they are needed. These include: Canes. Walkers. Scooters. Crutches. Turn on the lights when you go into a dark area. Replace any light bulbs as soon as they burn out. Set up your furniture so you have a clear path. Avoid moving your furniture around. If any of your floors are uneven, fix them. If there are any pets around you, be aware of where they are. Review your medicines with your doctor. Some medicines can make you feel dizzy. This can increase your chance of falling. Ask your doctor what other things that you can do to help prevent falls. This information is not intended to replace advice given to you by your health care provider. Make sure you discuss any questions you have with your health care provider. Document Released: 12/06/2008 Document Revised: 07/18/2015 Document Reviewed: 03/16/2014 Elsevier Interactive Patient Education  2017 [employer].

## 2022-09-29 NOTE — Progress Notes (Addendum)
Subjective:   Kathy [name] is a 62 y.o. female who presents for Medicare Annual (Subsequent) preventive examination.  Visit Complete: Virtual  I connected with  Kathy [name] on 09/29/22 by a audio enabled telemedicine application and verified that I am speaking with the correct person using two identifiers.  Patient Location: Home   Provider Location: Office/Clinic  I discussed the limitations of evaluation and management by telemedicine. The patient expressed understanding and agreed to proceed.  Vital Signs: Unable to obtain new vitals due to this being a telehealth visit.   Review of Systems     Cardiac Risk Factors include: advanced age (>8men, >12 women);hypertension;Other (see comment), Risk factor comments: Tachycardia     Objective:    Today's Vitals   09/29/22 1110  Weight: 109 lb (49.4 kg)  Height: 5\' 2"  (1.575 m)   Body mass index is 19.94 kg/m.     09/29/2022   11:28 AM 04/10/2022   10:00 AM 04/10/2022    3:15 AM 03/22/2022    8:51 AM 09/26/2021   10:58 AM 08/12/2021    8:56 AM 07/29/2021    8:49 AM  Advanced Directives  Does Patient Have a Medical Advance Directive? No No No No No No No  Would patient like information on creating a medical advance directive? No - Patient declined No - Patient declined   No - Patient declined No - Patient declined No - Patient declined    Current Medications (verified) Outpatient Encounter Medications as of 09/29/2022  Medication Sig   acetaminophen (TYLENOL) 500 MG tablet Take 500 mg by mouth every 6 (six) hours as needed.   ALPRAZolam (XANAX) 0.5 MG tablet Take 1 mg by mouth at bedtime as needed for anxiety.   cyanocobalamin (,VITAMIN B-12,) 1000 MCG/ML injection Inject 1 mL (1,000 mcg total) into the muscle every 14 (fourteen) days.   cyclobenzaprine (FLEXERIL) 10 MG tablet TAKE 1 TABLET BY MOUTH THREE TIMES DAILYAS NEEDED FOR MUSCLE SPASMS INVOLVING LEGS AND FEET   denosumab (PROLIA) 60 MG/ML SOSY injection  Inject 60 mg into the skin every 6 (six) months.   gabapentin (NEURONTIN) 400 MG capsule TAKE 2 CAPSULES (800 MG) BY MOUTH 3 TIMES DAILY   omeprazole (PRILOSEC) 40 MG capsule TAKE ONE CAPSULE BY MOUTH TWO TIMES DAILY   ondansetron (ZOFRAN-ODT) 8 MG disintegrating tablet TAKE ONE TABLET UNDER THE TONGUE EVERY 8HOURS AS NEEDED FOR NAUSEA AND VOMITING.   pediatric multivitamin + iron (POLY-VI-SOL + IRON) 11 MG/ML SOLN oral solution Take 1 mL by mouth daily.   potassium chloride SA (KLOR-CON M) 20 MEQ tablet Take 1 tablet (20 mEq total) by mouth daily.   propranolol (INDERAL) 10 MG tablet Take 1-2 tablets (10-20 mg total) by mouth 2 (two) times daily as needed (anxiety).   Syringe/Needle, Disp, (SYRINGE 3CC/25GX1") 25G X 1" 3 ML MISC Use for b12 injections   thiamine (VITAMIN B1) 100 MG tablet Take 1 tablet (100 mg total) by mouth daily.   amLODipine (NORVASC) 5 MG tablet TAKE 1 TABLET BY MOUTH TWICE DAILY (Patient not taking: Reported on 09/29/2022)   DULoxetine (CYMBALTA) 20 MG capsule Take 20 mg by mouth daily. (Patient not taking: Reported on 09/29/2022)   hydrOXYzine (ATARAX) 10 MG tablet Take 10 mg by mouth 3 (three) times daily as needed. (Patient not taking: Reported on 09/29/2022)   traMADol (ULTRAM) 50 MG tablet Take by mouth every 6 (six) hours as needed. (Patient not taking: Reported on 09/29/2022)   traZODone (DESYREL) 100  MG tablet Take 100 mg by mouth at bedtime. (Patient not taking: Reported on 09/29/2022)   No facility-administered encounter medications on file as of 09/29/2022.    Allergies (verified) Buspirone and Lyrica [pregabalin]   History: Past Medical History:  Diagnosis Date   Abdominal aortic atherosclerosis (HCC)    Abnormal weight loss    Alcohol abuse    Alcohol abuse with withdrawal (HCC) 10/13/2020   Allergic rhinitis    Anxiety    Bronchitis    Chicken pox    Colon polyps    COPD (chronic obstructive pulmonary disease) (HCC)    Depression    Dyspnea    Elevated  lipase    Fatty liver    Gallbladder mass 09/09/2020   8 x 6 x 6 mm non mobile  mass along GB wall noted on 2018 [place] 8 x  7 x 5 mm non mobile mass along GB wall noted on July  2022 [place]   GERD (gastroesophageal reflux disease)    Hepatitis    Hernia of abdominal cavity    Hip fracture (HCC) right   Left upper quadrant abdominal pain    Neuromuscular disorder (HCC)    Neuropathy    lower legs   Thrombocytopenia (HCC)    Wears dentures    full upper and lower   Past Surgical History:  Procedure Laterality Date   BIOPSY  08/31/2022   Procedure: BIOPSY;  Surgeon: Regis Bill, MD;  Location: [facility];  Service: Endoscopy;;   CATARACT EXTRACTION W/PHACO Right 07/29/2021   Procedure: CATARACT EXTRACTION PHACO AND INTRAOCULAR LENS PLACEMENT (IOC) RIGHT 19.12 01:27.4;  Surgeon: Galen Manila, MD;  Location: [facility];  Service: Ophthalmology;  Laterality: Right;   CATARACT EXTRACTION W/PHACO Left 08/12/2021   Procedure: CATARACT EXTRACTION PHACO AND INTRAOCULAR LENS PLACEMENT (IOC) LEFT 13.10 01:05.3;  Surgeon: Galen Manila, MD;  Location: [facility];  Service: Ophthalmology;  Laterality: Left;   CHOLECYSTECTOMY     COLONOSCOPY     COLONOSCOPY WITH PROPOFOL N/A 08/31/2022   Procedure: COLONOSCOPY WITH PROPOFOL;  Surgeon: Regis Bill, MD;  Location: [facility] END[facility];  Service: Endoscopy;  Laterality: N/A;   ESOPHAGOGASTRODUODENOSCOPY (EGD) WITH PROPOFOL N/A 08/31/2022   Procedure: ESOPHAGOGASTRODUODENOSCOPY (EGD) WITH PROPOFOL;  Surgeon: Regis Bill, MD;  Location: [facility];  Service: Endoscopy;  Laterality: N/A;   EYE SURGERY     POLYPECTOMY  08/31/2022   Procedure: POLYPECTOMY;  Surgeon: Regis Bill, MD;  Location: [facility];  Service: Endoscopy;;   ROBOTIC ASSISTED LAPAROSCOPIC CHOLECYSTECTOMY  04/16/2021   [facility]   Family History  Problem Relation Age of Onset   Hypertension Mother    Osteoporosis Mother    Meniere's  disease Mother    Cancer Father 47       prostate   Colon cancer Paternal Grandmother    Alcohol abuse Other    Social History   Socioeconomic History   Marital status: Divorced    Spouse name: Not on file   Number of children: Not on file   Years of education: Not on file   Highest education level: Not on file  Occupational History   Not on file  Tobacco Use   Smoking status: Every Day    Current packs/day: 0.50    Average packs/day: 0.5 packs/day for 40.0 years (20.0 ttl pk-yrs)    Types: Cigarettes   Smokeless tobacco: Never   Tobacco comments:    Started smoking age 32.  Quit during pregnancies.  Vaping  Use   Vaping status: Never Used  Substance and Sexual Activity   Alcohol use: Never    Alcohol/week: 42.0 standard drinks of alcohol    Types: 42 Glasses of wine per week    Comment: 07/23/21 - Pt reports "clean" for 5 yrs.  now drinks approx 6 drinks/wk   Drug use: No   Sexual activity: Not Currently  Other Topics Concern   Not on file  Social History Narrative   Not on file   Social Determinants of Health   Financial Resource Strain: Low Risk  (09/29/2022)   Overall Financial Resource Strain (CARDIA)    Difficulty of Paying Living Expenses: Not hard at all  Food Insecurity: No Food Insecurity (09/29/2022)   Hunger Vital Sign    Worried About Running Out of Food in the Last Year: Never true    Ran Out of Food in the Last Year: Never true  Transportation Needs: No Transportation Needs (09/29/2022)   PRAPARE - Administrator, Civil Service (Medical): No    Lack of Transportation (Non-Medical): No  Physical Activity: Insufficiently Active (09/29/2022)   Exercise Vital Sign    Days of Exercise per Week: 5 days    Minutes of Exercise per Session: 20 min  Stress: No Stress Concern Present (09/29/2022)   [employer] of Occupational Health - Occupational Stress Questionnaire    Feeling of Stress : Not at all  Social Connections: Moderately Isolated  (09/29/2022)   Social Connection and Isolation Panel [NHANES]    Frequency of Communication with Friends and Family: More than three times a week    Frequency of Social Gatherings with Friends and Family: More than three times a week    Attends Religious Services: More than 4 times per year    Active Member of [employer] or Organizations: No    Attends Banker Meetings: Never    Marital Status: Divorced    Tobacco Counseling Ready to quit: Not Answered Counseling given: Not Answered Tobacco comments: Started smoking age 69.  Quit during pregnancies.   Clinical Intake:  Pre-visit preparation completed: Yes  Pain : No/denies pain     BMI - recorded: 19.94 Nutritional Status: BMI of 19-24  Normal Nutritional Risks: None Diabetes: No  How often do you need to have someone help you when you read instructions, pamphlets, or other written materials from your doctor or pharmacy?: 1 - Never  Interpreter Needed?: No  Information entered by :: R.  LPN   Activities of Daily Living    09/29/2022   11:15 AM 04/10/2022   10:00 AM  In your present state of health, do you have any difficulty performing the following activities:  Hearing? 0 0  Vision? 0 0  Comment readers   Difficulty concentrating or making decisions? 0 0  Walking or climbing stairs? 0 0  Dressing or bathing? 0 0  Doing errands, shopping? 0 0  Preparing Food and eating ? N   Using the Toilet? N   In the past six months, have you accidently leaked urine? Y   Comment wears a pads, one time   Do you have problems with loss of bowel control? N   Managing your Medications? N   Managing your Finances? N   Housekeeping or managing your Housekeeping? N     Patient Care Team: Sherlene Shams, MD as PCP - General (Internal Medicine)  Indicate any recent Medical Services you may have received from other than [facility] providers in  the past year (date may be approximate).     Assessment:   This is a routine  wellness examination for Kathy [name].  Hearing/Vision screen Hearing Screening - Comments:: No issues Vision Screening - Comments:: readers  Dietary issues and exercise activities discussed:     Goals Addressed             This Visit's Progress    Patient Stated       Wants to exercise more and get out of the house more       Depression Screen    09/29/2022   11:23 AM 07/09/2022   11:18 AM 07/03/2022   12:26 PM 03/26/2022   11:24 AM 12/12/2021   10:15 AM 09/26/2021   10:50 AM 05/28/2021    9:48 AM  PHQ 2/9 Scores  PHQ - 2 Score 0 0 1 0 2 0 2  PHQ- 9 Score 0 1 3 0   5    Fall Risk    09/29/2022   11:17 AM 07/09/2022   11:18 AM 07/03/2022   12:26 PM 03/26/2022   11:23 AM 12/12/2021   10:14 AM  Fall Risk   Falls in the past year? 0 1 0 0 0  Number falls in past yr: 0 0 0 0 0  Injury with Fall? 0 1 0 0 0  Risk for fall due to : No Fall Risks History of fall(s) No Fall Risks No Fall Risks No Fall Risks  Follow up Falls prevention discussed;Falls evaluation completed Falls evaluation completed Falls evaluation completed Falls evaluation completed Falls evaluation completed    MEDICARE RISK AT HOME:  Medicare Risk at Home - 09/29/22 1118     Any stairs in or around the home? No    If so, are there any without handrails? No    Home free of loose throw rugs in walkways, pet beds, electrical cords, etc? Yes    Adequate lighting in your home to reduce risk of falls? Yes    Life alert? No    Use of a cane, walker or w/c? No    Grab bars in the bathroom? Yes    Shower chair or bench in shower? Yes    Elevated toilet seat or a handicapped toilet? No               Cognitive Function:        09/29/2022   11:29 AM  6CIT Screen  What Year? 0 points  What month? 0 points  What time? 0 points  Count back from 20 0 points  Months in reverse 0 points  Repeat phrase 4 points  Total Score 4 points    Immunizations Immunization History  Administered Date(s) Administered    Hep A / Hep B 10/11/2014, 11/14/2014, 04/22/2015   Influenza-Unspecified 11/24/2018, 12/08/2019   PFIZER(Purple Top)SARS-COV-2 Vaccination 12/04/2019, 12/25/2019   PPD Test 09/16/2015   Tdap 07/01/2022    TDAP status: Up to date  Flu Vaccine status: Due, Education has been provided regarding the importance of this vaccine. Advised may receive this vaccine at local pharmacy or Health Dept. Aware to provide a copy of the vaccination record if obtained from local pharmacy or Health Dept. Verbalized acceptance and understanding.  Pneumococcal vaccine status: Declined,  Education has been provided regarding the importance of this vaccine but patient still declined. Advised may receive this vaccine at local pharmacy or Health Dept. Aware to provide a copy of the vaccination record if obtained from local pharmacy or Health  Dept. Verbalized acceptance and understanding.   Covid-19 vaccine status: Completed vaccines  Qualifies for Shingles Vaccine? Yes   Zostavax completed No   Shingrix Completed?: No.    Education has been provided regarding the importance of this vaccine. Patient has been advised to call insurance company to determine out of pocket expense if they have not yet received this vaccine. Advised may also receive vaccine at local pharmacy or Health Dept. Verbalized acceptance and understanding.  Screening Tests Health Maintenance  Topic Date Due   Zoster Vaccines- Shingrix (1 of 2) Never done   Lung Cancer Screening  05/16/2022   MAMMOGRAM  07/02/2022   Medicare Annual Wellness (AWV)  09/27/2022   INFLUENZA VACCINE  09/24/2022   PAP SMEAR-Modifier  10/24/2024   Colonoscopy  08/31/2027   DTaP/Tdap/Td (2 - Td or Tdap) 06/30/2032   Hepatitis C Screening  Completed   HIV Screening  Completed   HPV VACCINES  Aged Out   COVID-19 Vaccine  Discontinued    Health Maintenance  Health Maintenance Due  Topic Date Due   Zoster Vaccines- Shingrix (1 of 2) Never done   Lung Cancer  Screening  05/16/2022   MAMMOGRAM  07/02/2022   Medicare Annual Wellness (AWV)  09/27/2022   INFLUENZA VACCINE  09/24/2022    Colorectal cancer screening: Type of screening: Colonoscopy. Completed 7/24. Repeat every 3 years  Mammogram status: Completed 5/23. Repeat every year Scheduled 8/24   Lung Cancer Screening: (Low Dose CT Chest recommended if Age 11-80 years, 20 pack-year currently smoking OR have quit w/in 15years.) does qualify. Last 3/23  Lung screening order placed 10/23  Additional Screening:  Hepatitis C Screening: does qualify; Completed 7/17  Vision Screening: Recommended annual ophthalmology exams for early detection of glaucoma and other disorders of the eye. Is the patient up to date with their annual eye exam?  Yes  Who is the provider or what is the name of the office in which the patient attends annual eye exams? [facility] If pt is not established with a provider, would they like to be referred to a provider to establish care? No .   Dental Screening: Recommended annual dental exams for proper oral hygiene   Community Resource Referral / Chronic Care Management: CRR required this visit?  No   CCM required this visit?  No     Plan:     I have personally reviewed and noted the following in the patient's chart:   Medical and social history Use of alcohol, tobacco or illicit drugs  Current medications and supplements including opioid prescriptions. Patient is not currently taking opioid prescriptions. Functional ability and status Nutritional status Physical activity Advanced directives List of other physicians Hospitalizations, surgeries, and ER visits in previous 12 months Vitals Screenings to include cognitive, depression, and falls Referrals and appointments  In addition, I have reviewed and discussed with patient certain preventive protocols, quality metrics, and best practice recommendations. A written personalized care plan for preventive  services as well as general preventive health recommendations were provided to patient.     Sydell Axon, LPN   04/02/4130   After Visit Summary: (MyChart) Due to this being a telephonic visit, the after visit summary with patients personalized plan was offered to patient via MyChart   Nurse Notes: None   I have reviewed the above information and agree with above.   Duncan Dull, MD

## 2022-10-01 ENCOUNTER — Ambulatory Visit: Payer: Medicare HMO

## 2022-10-07 ENCOUNTER — Other Ambulatory Visit: Payer: Self-pay | Admitting: Internal Medicine

## 2022-10-08 ENCOUNTER — Ambulatory Visit: Payer: Medicare HMO

## 2022-10-08 ENCOUNTER — Telehealth: Payer: Self-pay | Admitting: Internal Medicine

## 2022-10-08 ENCOUNTER — Ambulatory Visit (INDEPENDENT_AMBULATORY_CARE_PROVIDER_SITE_OTHER): Payer: Medicare HMO

## 2022-10-08 DIAGNOSIS — E538 Deficiency of other specified B group vitamins: Secondary | ICD-10-CM

## 2022-10-08 MED ORDER — CYANOCOBALAMIN 1000 MCG/ML IJ SOLN
1000.0000 ug | Freq: Once | INTRAMUSCULAR | Status: AC
Start: 2022-10-08 — End: 2022-10-08
  Administered 2022-10-08: 1000 ug via INTRAMUSCULAR

## 2022-10-08 NOTE — Progress Notes (Signed)
Patient arrived for a B12 injection and it was administered into her left deltoid. Patient tolerated the injection well.

## 2022-10-08 NOTE — Telephone Encounter (Signed)
Patient was in for a b12 today and requested to renew her handicapp plaque that needs renewed in October.

## 2022-10-09 ENCOUNTER — Ambulatory Visit: Payer: Medicare HMO

## 2022-10-14 ENCOUNTER — Ambulatory Visit
Admission: RE | Admit: 2022-10-14 | Discharge: 2022-10-14 | Disposition: A | Discharge status: Home / Self Care | Payer: Medicare HMO | Source: Ambulatory Visit | Attending: Internal Medicine | Admitting: Internal Medicine

## 2022-10-14 DIAGNOSIS — Z1231 Encounter for screening mammogram for malignant neoplasm of breast: Secondary | ICD-10-CM | POA: Diagnosis not present

## 2022-10-14 NOTE — Telephone Encounter (Signed)
Placed in quick sign folder.  

## 2022-10-15 NOTE — Telephone Encounter (Signed)
LMTCB. Need to let pt know that handicap form is complete and ready for pick up. Placed up front in accordion folder.

## 2022-10-20 NOTE — Telephone Encounter (Signed)
noted 

## 2022-10-20 NOTE — Telephone Encounter (Signed)
Patient called office back and note was read that hadicapp renewal form is up front to be picked up.

## 2022-11-02 ENCOUNTER — Ambulatory Visit (INDEPENDENT_AMBULATORY_CARE_PROVIDER_SITE_OTHER): Payer: Medicare HMO

## 2022-11-02 ENCOUNTER — Telehealth: Payer: Self-pay | Admitting: *Deleted

## 2022-11-02 DIAGNOSIS — E538 Deficiency of other specified B group vitamins: Secondary | ICD-10-CM

## 2022-11-02 MED ORDER — CYANOCOBALAMIN 1000 MCG/ML IJ SOLN
1000.0000 ug | Freq: Once | INTRAMUSCULAR | Status: AC
Start: 2022-11-02 — End: 2022-11-02
  Administered 2022-11-02: 1000 ug via INTRAMUSCULAR

## 2022-11-02 NOTE — Telephone Encounter (Addendum)
Pt due for Prolia Injection on or after 10/31/2022  $0 due; PA on file  Appt scheduled on 11/16/22. Pt aware

## 2022-11-02 NOTE — Progress Notes (Signed)
Pt presented for their vitamin B12 injection. Pt was identified through two identifiers. Pt tolerated shot well in their right deltoid.  

## 2022-11-16 ENCOUNTER — Ambulatory Visit (INDEPENDENT_AMBULATORY_CARE_PROVIDER_SITE_OTHER): Payer: Medicare HMO

## 2022-11-16 DIAGNOSIS — E538 Deficiency of other specified B group vitamins: Secondary | ICD-10-CM | POA: Diagnosis not present

## 2022-11-16 DIAGNOSIS — M810 Age-related osteoporosis without current pathological fracture: Secondary | ICD-10-CM

## 2022-11-16 MED ORDER — CYANOCOBALAMIN 1000 MCG/ML IJ SOLN
1000.0000 ug | Freq: Once | INTRAMUSCULAR | Status: AC
Start: 2022-11-16 — End: 2022-11-16
  Administered 2022-11-16: 1000 ug via INTRAMUSCULAR

## 2022-11-16 MED ORDER — DENOSUMAB 60 MG/ML ~~LOC~~ SOSY
60.0000 mg | PREFILLED_SYRINGE | Freq: Once | SUBCUTANEOUS | Status: AC
Start: 2022-11-16 — End: 2022-11-16
  Administered 2022-11-16: 60 mg via SUBCUTANEOUS

## 2022-11-16 NOTE — Progress Notes (Signed)
Pt presented for their subcutaneous Prolia injection. Pt was identified through two identifiers. Pt was given the information packets about the Prolia and told to schedule their next injection 6 months out. Pt tolerated the subq injection well in the left  arm.   Pt also stated she was due for a b12 injection and that she gets them done every two weeks. Checked pts chart her last B12 lab was back in 2021 and her most recent B12 injection was done on 11-02-22. Pt stated she was told by the front staff that this was her two weeks. Pt tolerated b12 injection in the right deltoid.

## 2022-11-28 ENCOUNTER — Emergency Department: Payer: Medicare HMO

## 2022-11-28 ENCOUNTER — Inpatient Hospital Stay
Admission: EM | Admit: 2022-11-28 | Discharge: 2022-12-07 | DRG: 896 | Disposition: A | Discharge status: Skilled Nursing Facility | Payer: Medicare HMO | Attending: Student | Admitting: Student

## 2022-11-28 ENCOUNTER — Other Ambulatory Visit: Payer: Self-pay

## 2022-11-28 DIAGNOSIS — Z800 Family history of malignant neoplasm of digestive organs: Secondary | ICD-10-CM

## 2022-11-28 DIAGNOSIS — R110 Nausea: Secondary | ICD-10-CM | POA: Diagnosis not present

## 2022-11-28 DIAGNOSIS — E876 Hypokalemia: Secondary | ICD-10-CM | POA: Diagnosis not present

## 2022-11-28 DIAGNOSIS — G629 Polyneuropathy, unspecified: Secondary | ICD-10-CM | POA: Diagnosis present

## 2022-11-28 DIAGNOSIS — K2920 Alcoholic gastritis without bleeding: Secondary | ICD-10-CM | POA: Diagnosis present

## 2022-11-28 DIAGNOSIS — Z9049 Acquired absence of other specified parts of digestive tract: Secondary | ICD-10-CM | POA: Diagnosis not present

## 2022-11-28 DIAGNOSIS — J189 Pneumonia, unspecified organism: Secondary | ICD-10-CM | POA: Diagnosis present

## 2022-11-28 DIAGNOSIS — Z7401 Bed confinement status: Secondary | ICD-10-CM | POA: Diagnosis not present

## 2022-11-28 DIAGNOSIS — B9620 Unspecified Escherichia coli [E. coli] as the cause of diseases classified elsewhere: Secondary | ICD-10-CM | POA: Diagnosis present

## 2022-11-28 DIAGNOSIS — I700 Atherosclerosis of aorta: Secondary | ICD-10-CM | POA: Diagnosis not present

## 2022-11-28 DIAGNOSIS — R6889 Other general symptoms and signs: Secondary | ICD-10-CM | POA: Diagnosis not present

## 2022-11-28 DIAGNOSIS — Z860100 Personal history of colon polyps, unspecified: Secondary | ICD-10-CM

## 2022-11-28 DIAGNOSIS — Z1152 Encounter for screening for COVID-19: Secondary | ICD-10-CM

## 2022-11-28 DIAGNOSIS — R0602 Shortness of breath: Secondary | ICD-10-CM | POA: Diagnosis not present

## 2022-11-28 DIAGNOSIS — Z888 Allergy status to other drugs, medicaments and biological substances status: Secondary | ICD-10-CM

## 2022-11-28 DIAGNOSIS — N390 Urinary tract infection, site not specified: Secondary | ICD-10-CM | POA: Diagnosis present

## 2022-11-28 DIAGNOSIS — K760 Fatty (change of) liver, not elsewhere classified: Secondary | ICD-10-CM | POA: Diagnosis present

## 2022-11-28 DIAGNOSIS — I2510 Atherosclerotic heart disease of native coronary artery without angina pectoris: Secondary | ICD-10-CM | POA: Diagnosis present

## 2022-11-28 DIAGNOSIS — F10239 Alcohol dependence with withdrawal, unspecified: Secondary | ICD-10-CM | POA: Diagnosis not present

## 2022-11-28 DIAGNOSIS — R059 Cough, unspecified: Secondary | ICD-10-CM | POA: Diagnosis not present

## 2022-11-28 DIAGNOSIS — R918 Other nonspecific abnormal finding of lung field: Secondary | ICD-10-CM | POA: Diagnosis not present

## 2022-11-28 DIAGNOSIS — I10 Essential (primary) hypertension: Secondary | ICD-10-CM | POA: Diagnosis present

## 2022-11-28 DIAGNOSIS — Z8262 Family history of osteoporosis: Secondary | ICD-10-CM

## 2022-11-28 DIAGNOSIS — R1111 Vomiting without nausea: Secondary | ICD-10-CM | POA: Diagnosis not present

## 2022-11-28 DIAGNOSIS — E440 Moderate protein-calorie malnutrition: Secondary | ICD-10-CM | POA: Diagnosis not present

## 2022-11-28 DIAGNOSIS — K219 Gastro-esophageal reflux disease without esophagitis: Secondary | ICD-10-CM | POA: Diagnosis present

## 2022-11-28 DIAGNOSIS — I509 Heart failure, unspecified: Secondary | ICD-10-CM | POA: Diagnosis not present

## 2022-11-28 DIAGNOSIS — Z8042 Family history of malignant neoplasm of prostate: Secondary | ICD-10-CM

## 2022-11-28 DIAGNOSIS — R160 Hepatomegaly, not elsewhere classified: Secondary | ICD-10-CM | POA: Diagnosis not present

## 2022-11-28 DIAGNOSIS — K449 Diaphragmatic hernia without obstruction or gangrene: Secondary | ICD-10-CM | POA: Diagnosis not present

## 2022-11-28 DIAGNOSIS — E785 Hyperlipidemia, unspecified: Secondary | ICD-10-CM | POA: Diagnosis present

## 2022-11-28 DIAGNOSIS — D510 Vitamin B12 deficiency anemia due to intrinsic factor deficiency: Secondary | ICD-10-CM | POA: Diagnosis present

## 2022-11-28 DIAGNOSIS — R4182 Altered mental status, unspecified: Secondary | ICD-10-CM | POA: Diagnosis not present

## 2022-11-28 DIAGNOSIS — J440 Chronic obstructive pulmonary disease with acute lower respiratory infection: Secondary | ICD-10-CM | POA: Diagnosis not present

## 2022-11-28 DIAGNOSIS — Z87891 Personal history of nicotine dependence: Secondary | ICD-10-CM

## 2022-11-28 DIAGNOSIS — E871 Hypo-osmolality and hyponatremia: Secondary | ICD-10-CM | POA: Diagnosis not present

## 2022-11-28 DIAGNOSIS — Z8249 Family history of ischemic heart disease and other diseases of the circulatory system: Secondary | ICD-10-CM

## 2022-11-28 DIAGNOSIS — F1090 Alcohol use, unspecified, uncomplicated: Secondary | ICD-10-CM | POA: Diagnosis present

## 2022-11-28 DIAGNOSIS — K5730 Diverticulosis of large intestine without perforation or abscess without bleeding: Secondary | ICD-10-CM | POA: Diagnosis not present

## 2022-11-28 DIAGNOSIS — Z79899 Other long term (current) drug therapy: Secondary | ICD-10-CM

## 2022-11-28 DIAGNOSIS — R000 Tachycardia, unspecified: Secondary | ICD-10-CM | POA: Diagnosis not present

## 2022-11-28 DIAGNOSIS — F10939 Alcohol use, unspecified with withdrawal, unspecified: Secondary | ICD-10-CM | POA: Diagnosis present

## 2022-11-28 LAB — COMPREHENSIVE METABOLIC PANEL
ALT: 25 U/L (ref 0–44)
AST: 126 U/L — ABNORMAL HIGH (ref 15–41)
Albumin: 2.4 g/dL — ABNORMAL LOW (ref 3.5–5.0)
Alkaline Phosphatase: 94 U/L (ref 38–126)
Anion gap: 16 — ABNORMAL HIGH (ref 5–15)
BUN: <5 mg/dL — ABNORMAL LOW (ref 6–20)
CO2: 22 mmol/L (ref 22–32)
Calcium: 6.1 mg/dL — CL (ref 8.9–10.3)
Chloride: 94 mmol/L — ABNORMAL LOW (ref 98–111)
Creatinine, Ser: 0.53 mg/dL (ref 0.44–1.00)
GFR, Estimated: >60 mL/min (ref >60)
Glucose, Bld: 136 mg/dL — ABNORMAL HIGH (ref 70–99)
Potassium: 2.4 mmol/L — CL (ref 3.5–5.1)
Sodium: 132 mmol/L — ABNORMAL LOW (ref 135–145)
Total Bilirubin: 1.5 mg/dL — ABNORMAL HIGH (ref 0.3–1.2)
Total Protein: 5.8 g/dL — ABNORMAL LOW (ref 6.5–8.1)

## 2022-11-28 LAB — CBC WITH DIFFERENTIAL/PLATELET
Abs Immature Granulocytes: 0.02 10*3/uL (ref 0.00–0.07)
Basophils Absolute: 0.1 10*3/uL (ref 0.0–0.1)
Basophils Relative: 1 %
Eosinophils Absolute: 0 10*3/uL (ref 0.0–0.5)
Eosinophils Relative: 1 %
HCT: 26.5 % — ABNORMAL LOW (ref 36.0–46.0)
Hemoglobin: 9.2 g/dL — ABNORMAL LOW (ref 12.0–15.0)
Immature Granulocytes: 0 %
Lymphocytes Relative: 32 %
Lymphs Abs: 1.9 10*3/uL (ref 0.7–4.0)
MCH: 35.9 pg — ABNORMAL HIGH (ref 26.0–34.0)
MCHC: 34.7 g/dL (ref 30.0–36.0)
MCV: 103.5 fL — ABNORMAL HIGH (ref 80.0–100.0)
Monocytes Absolute: 0.5 10*3/uL (ref 0.1–1.0)
Monocytes Relative: 8 %
Neutro Abs: 3.4 10*3/uL (ref 1.7–7.7)
Neutrophils Relative %: 58 %
Platelets: 153 10*3/uL (ref 150–400)
RBC: 2.56 MIL/uL — ABNORMAL LOW (ref 3.87–5.11)
RDW: 14.6 % (ref 11.5–15.5)
WBC: 5.9 10*3/uL (ref 4.0–10.5)
nRBC: 0 % (ref 0.0–0.2)

## 2022-11-28 LAB — BASIC METABOLIC PANEL
Anion gap: 12 (ref 5–15)
BUN: <5 mg/dL — ABNORMAL LOW (ref 6–20)
CO2: 21 mmol/L — ABNORMAL LOW (ref 22–32)
Calcium: 5.8 mg/dL — CL (ref 8.9–10.3)
Chloride: 98 mmol/L (ref 98–111)
Creatinine, Ser: 0.45 mg/dL (ref 0.44–1.00)
GFR, Estimated: >60 mL/min (ref >60)
Glucose, Bld: 137 mg/dL — ABNORMAL HIGH (ref 70–99)
Potassium: 3.7 mmol/L (ref 3.5–5.1)
Sodium: 131 mmol/L — ABNORMAL LOW (ref 135–145)

## 2022-11-28 LAB — IRON AND TIBC
Iron: 145 ug/dL (ref 28–170)
Saturation Ratios: 93 % — ABNORMAL HIGH (ref 10.4–31.8)
TIBC: 157 ug/dL — ABNORMAL LOW (ref 250–450)
UIBC: 12 ug/dL

## 2022-11-28 LAB — MAGNESIUM
Magnesium: 0.9 mg/dL — CL (ref 1.7–2.4)
Magnesium: 1.5 mg/dL — ABNORMAL LOW (ref 1.7–2.4)

## 2022-11-28 LAB — URINALYSIS, ROUTINE W REFLEX MICROSCOPIC
Bilirubin Urine: NEGATIVE
Glucose, UA: NEGATIVE mg/dL
Hgb urine dipstick: NEGATIVE
Ketones, ur: NEGATIVE mg/dL
Nitrite: NEGATIVE
Protein, ur: NEGATIVE mg/dL
Specific Gravity, Urine: 1.004 — ABNORMAL LOW (ref 1.005–1.030)
pH: 7 (ref 5.0–8.0)

## 2022-11-28 LAB — PHOSPHORUS: Phosphorus: 3.1 mg/dL (ref 2.5–4.6)

## 2022-11-28 LAB — FERRITIN: Ferritin: 354 ng/mL — ABNORMAL HIGH (ref 11–307)

## 2022-11-28 LAB — RETICULOCYTES
Immature Retic Fract: 17.5 % — ABNORMAL HIGH (ref 2.3–15.9)
RBC.: 2.63 MIL/uL — ABNORMAL LOW (ref 3.87–5.11)
Retic Count, Absolute: 50.2 10*3/uL (ref 19.0–186.0)
Retic Ct Pct: 1.9 % (ref 0.4–3.1)

## 2022-11-28 LAB — ALBUMIN: Albumin: 2.5 g/dL — ABNORMAL LOW (ref 3.5–5.0)

## 2022-11-28 LAB — SARS CORONAVIRUS 2 BY RT PCR: SARS Coronavirus 2 by RT PCR: NEGATIVE

## 2022-11-28 LAB — LIPASE, BLOOD: Lipase: 34 U/L (ref 11–51)

## 2022-11-28 LAB — FOLATE: Folate: 5.3 ng/mL — ABNORMAL LOW (ref >5.9)

## 2022-11-28 MED ORDER — MAGNESIUM SULFATE 2 GM/50ML IV SOLN
2.0000 g | Freq: Once | INTRAVENOUS | Status: AC
  Administered 2022-11-28: 2 g via INTRAVENOUS
  Filled 2022-11-28: qty 50

## 2022-11-28 MED ORDER — IOHEXOL 300 MG/ML  SOLN
100.0000 mL | Freq: Once | INTRAMUSCULAR | Status: AC | PRN
  Administered 2022-11-28: 75 mL via INTRAVENOUS

## 2022-11-28 MED ORDER — THIAMINE HCL 100 MG PO TABS
100.0000 mg | ORAL_TABLET | Freq: Every day | ORAL | Status: DC
  Filled 2022-11-28: qty 1

## 2022-11-28 MED ORDER — ACETAMINOPHEN 500 MG PO TABS
500.0000 mg | ORAL_TABLET | Freq: Four times a day (QID) | ORAL | Status: DC | PRN
  Administered 2022-12-03: 500 mg via ORAL
  Filled 2022-11-28: qty 1

## 2022-11-28 MED ORDER — CALCIUM GLUCONATE-NACL 2-0.675 GM/100ML-% IV SOLN
2.0000 g | Freq: Once | INTRAVENOUS | Status: AC
  Administered 2022-11-28: 2000 mg via INTRAVENOUS
  Filled 2022-11-28: qty 100

## 2022-11-28 MED ORDER — CYCLOBENZAPRINE HCL 10 MG PO TABS
5.0000 mg | ORAL_TABLET | Freq: Three times a day (TID) | ORAL | Status: DC | PRN
  Administered 2022-12-02: 5 mg via ORAL
  Filled 2022-11-28: qty 1

## 2022-11-28 MED ORDER — CALCIUM GLUCONATE-NACL 1-0.675 GM/50ML-% IV SOLN
1.0000 g | Freq: Once | INTRAVENOUS | Status: AC
  Administered 2022-11-28: 1000 mg via INTRAVENOUS
  Filled 2022-11-28: qty 50

## 2022-11-28 MED ORDER — POTASSIUM CHLORIDE CRYS ER 20 MEQ PO TBCR
40.0000 meq | EXTENDED_RELEASE_TABLET | Freq: Once | ORAL | Status: AC
  Administered 2022-11-28: 40 meq via ORAL
  Filled 2022-11-28: qty 2

## 2022-11-28 MED ORDER — MORPHINE SULFATE (PF) 2 MG/ML IV SOLN
2.0000 mg | Freq: Once | INTRAVENOUS | Status: AC
  Administered 2022-11-28: 2 mg via INTRAVENOUS
  Filled 2022-11-28: qty 1

## 2022-11-28 MED ORDER — PANTOPRAZOLE SODIUM 40 MG PO TBEC: 40.0000 mg | DELAYED_RELEASE_TABLET | Freq: Every day | ORAL | Status: DC

## 2022-11-28 MED ORDER — SODIUM CHLORIDE 0.9 % IV SOLN
1.0000 mg | Freq: Once | INTRAVENOUS | Status: AC
  Administered 2022-11-28: 1 mg via INTRAVENOUS
  Filled 2022-11-28: qty 0.2

## 2022-11-28 MED ORDER — LORAZEPAM 1 MG PO TABS
1.0000 mg | ORAL_TABLET | ORAL | Status: AC | PRN
  Administered 2022-11-28 – 2022-11-29 (×2): 1 mg via ORAL
  Filled 2022-11-28 (×2): qty 1

## 2022-11-28 MED ORDER — THIAMINE HCL 100 MG/ML IJ SOLN: 100.0000 mg | Freq: Every day | INTRAMUSCULAR | Status: DC

## 2022-11-28 MED ORDER — THIAMINE HCL 100 MG PO TABS: 100.0000 mg | ORAL_TABLET | Freq: Every day | ORAL | Status: DC

## 2022-11-28 MED ORDER — ONDANSETRON HCL 4 MG/2ML IJ SOLN
4.0000 mg | Freq: Once | INTRAMUSCULAR | Status: AC
  Administered 2022-11-28: 4 mg via INTRAVENOUS
  Filled 2022-11-28: qty 2

## 2022-11-28 MED ORDER — THIAMINE HCL 100 MG/ML IJ SOLN
100.0000 mg | Freq: Once | INTRAMUSCULAR | Status: DC
  Filled 2022-11-28: qty 2

## 2022-11-28 MED ORDER — SODIUM CHLORIDE 0.9 % IV SOLN: Freq: Once | INTRAVENOUS | Status: AC

## 2022-11-28 MED ORDER — PROPRANOLOL HCL 20 MG PO TABS
10.0000 mg | ORAL_TABLET | Freq: Two times a day (BID) | ORAL | Status: DC | PRN
  Administered 2022-11-30: 20 mg via ORAL
  Filled 2022-11-28: qty 1

## 2022-11-28 MED ORDER — LORAZEPAM 2 MG/ML IJ SOLN
1.0000 mg | INTRAMUSCULAR | Status: AC | PRN
  Administered 2022-11-29 (×2): 2 mg via INTRAVENOUS
  Filled 2022-11-28 (×2): qty 1

## 2022-11-28 MED ORDER — ENOXAPARIN SODIUM 40 MG/0.4ML IJ SOSY
40.0000 mg | PREFILLED_SYRINGE | INTRAMUSCULAR | Status: DC
  Administered 2022-11-28 – 2022-12-07 (×10): 40 mg via SUBCUTANEOUS
  Filled 2022-11-28 (×10): qty 0.4

## 2022-11-28 MED ORDER — MORPHINE SULFATE (PF) 4 MG/ML IV SOLN
4.0000 mg | Freq: Once | INTRAVENOUS | Status: AC
  Administered 2022-11-28: 4 mg via INTRAVENOUS
  Filled 2022-11-28: qty 1

## 2022-11-28 MED ORDER — PANTOPRAZOLE SODIUM 40 MG PO TBEC
40.0000 mg | DELAYED_RELEASE_TABLET | Freq: Two times a day (BID) | ORAL | Status: DC
  Administered 2022-11-28: 40 mg via ORAL
  Filled 2022-11-28 (×2): qty 1

## 2022-11-28 MED ORDER — ONDANSETRON HCL 4 MG/2ML IJ SOLN
4.0000 mg | Freq: Four times a day (QID) | INTRAMUSCULAR | Status: DC | PRN
  Administered 2022-12-04 – 2022-12-05 (×2): 4 mg via INTRAVENOUS
  Filled 2022-11-28 (×2): qty 2

## 2022-11-28 MED ORDER — GABAPENTIN 400 MG PO CAPS
800.0000 mg | ORAL_CAPSULE | Freq: Three times a day (TID) | ORAL | Status: DC
  Administered 2022-11-28 – 2022-12-07 (×24): 800 mg via ORAL
  Filled 2022-11-28 (×26): qty 2

## 2022-11-28 MED ORDER — OXYCODONE HCL 5 MG PO TABS
5.0000 mg | ORAL_TABLET | ORAL | Status: DC | PRN
  Administered 2022-12-01 – 2022-12-03 (×5): 5 mg via ORAL
  Filled 2022-11-28 (×5): qty 1

## 2022-11-28 MED ORDER — ADULT MULTIVITAMIN W/MINERALS CH
1.0000 | ORAL_TABLET | Freq: Every day | ORAL | Status: DC
  Administered 2022-11-28 – 2022-12-07 (×10): 1 via ORAL
  Filled 2022-11-28 (×10): qty 1

## 2022-11-28 MED ORDER — SODIUM CHLORIDE 0.9 % IV BOLUS
1000.0000 mL | Freq: Once | INTRAVENOUS | Status: AC
  Administered 2022-11-28: 1000 mL via INTRAVENOUS

## 2022-11-28 MED ORDER — THIAMINE MONONITRATE 100 MG PO TABS: 100.0000 mg | ORAL_TABLET | Freq: Every day | ORAL | Status: DC

## 2022-11-28 MED ORDER — FOLIC ACID 1 MG PO TABS
1.0000 mg | ORAL_TABLET | Freq: Every day | ORAL | Status: DC
  Administered 2022-11-28 – 2022-11-30 (×3): 1 mg via ORAL
  Filled 2022-11-28 (×3): qty 1

## 2022-11-28 MED ORDER — MAGNESIUM SULFATE 4 GM/100ML IV SOLN
4.0000 g | Freq: Once | INTRAVENOUS | Status: AC
  Administered 2022-11-28: 4 g via INTRAVENOUS
  Filled 2022-11-28: qty 100

## 2022-11-28 MED ORDER — ALPRAZOLAM 1 MG PO TABS
1.0000 mg | ORAL_TABLET | Freq: Every evening | ORAL | Status: DC | PRN
  Administered 2022-11-30: 1 mg via ORAL
  Filled 2022-11-28: qty 1

## 2022-11-28 MED ORDER — POTASSIUM CHLORIDE 10 MEQ/100ML IV SOLN
10.0000 meq | INTRAVENOUS | Status: AC
  Administered 2022-11-28 (×3): 10 meq via INTRAVENOUS
  Filled 2022-11-28 (×3): qty 100

## 2022-11-28 MED ORDER — SODIUM CHLORIDE 0.9 % IV SOLN
2.0000 g | Freq: Once | INTRAVENOUS | Status: AC
  Administered 2022-11-28: 2 g via INTRAVENOUS
  Filled 2022-11-28: qty 20

## 2022-11-28 NOTE — ED Provider Notes (Signed)
[facility] Provider Note    Event Date/Time   First MD Initiated Contact with Patient 11/28/22 [phone]     (approximate)   History   Nausea (Nausea with some vomiting and dry heaves)   HPI  Kathy [name] is a 62 y.o. female with past medical history of alcohol abuse, hypertension, hyperlipidemia, hiatal hernia, here with nausea,.  The patient states that over the last several days, she has had persistent, severe, nausea vomiting.  She has had difficulty keeping anything down.  She had associated vomiting.  She subsequent feels very weak and has had difficulty even getting around the house today.  She does admit that she is been drinking recently but that she has not had anything to drink in several days.  Denies any tremors.  No seizures.  No other complaints.     Physical Exam   Triage Vital Signs: ED Triage Vitals  Encounter Vitals Group     BP 11/28/22 0848 (!) 144/77     Systolic BP Percentile --      Diastolic BP Percentile --      Pulse --      Resp 11/28/22 0847 13     Temp 11/28/22 0847 98.5 F (36.9 C)     Temp Source 11/28/22 0847 Oral     SpO2 11/28/22 0847 100 %     Weight 11/28/22 0849 121 lb 4.8 oz (55 kg)     Height 11/28/22 0849 5\' 2"  (1.575 m)     Head Circumference --      Peak Flow --      Pain Score 11/28/22 0846 8     Pain Loc --      Pain Education --      Exclude from Growth Chart --     Most recent vital signs: Vitals:   11/28/22 1647 11/28/22 1723  BP: 112/77 112/77  Pulse: (!) 116 (!) 116  Resp: 20   Temp: 98.2 F (36.8 C)   SpO2: 100%      General: Awake, no distress.  CV:  Good peripheral perfusion.  Regular rate and rhythm.  No murmurs. Resp:  Normal work of breathing.  Lungs clear. Abd:  No distention.  Mild tenderness palpation in the epigastric area.  No rebound or guarding. Other:  Dry mucous membranes.   ED Results / Procedures / Treatments   Labs (all labs ordered are listed, but only  abnormal results are displayed) Labs Reviewed  CBC WITH DIFFERENTIAL/PLATELET - Abnormal; Notable for the following components:      Result Value   RBC 2.56 (*)    Hemoglobin 9.2 (*)    HCT 26.5 (*)    MCV 103.5 (*)    MCH 35.9 (*)    All other components within normal limits  COMPREHENSIVE METABOLIC PANEL - Abnormal; Notable for the following components:   Sodium 132 (*)    Potassium 2.4 (*)    Chloride 94 (*)    Glucose, Bld 136 (*)    BUN <5 (*)    Calcium 6.1 (*)    Total Protein 5.8 (*)    Albumin 2.4 (*)    AST 126 (*)    Total Bilirubin 1.5 (*)    Anion gap 16 (*)    All other components within normal limits  URINALYSIS, ROUTINE W REFLEX MICROSCOPIC - Abnormal; Notable for the following components:   Color, Urine YELLOW (*)    APPearance HAZY (*)    Specific  Gravity, Urine 1.004 (*)    Leukocytes,Ua LARGE (*)    Bacteria, UA MANY (*)    All other components within normal limits  MAGNESIUM - Abnormal; Notable for the following components:   Magnesium 0.9 (*)    All other components within normal limits  IRON AND TIBC - Abnormal; Notable for the following components:   TIBC 157 (*)    Saturation Ratios 93 (*)    All other components within normal limits  FERRITIN - Abnormal; Notable for the following components:   Ferritin 354 (*)    All other components within normal limits  RETICULOCYTES - Abnormal; Notable for the following components:   RBC. 2.63 (*)    Immature Retic Fract 17.5 (*)    All other components within normal limits  FOLATE - Abnormal; Notable for the following components:   Folate 5.3 (*)    All other components within normal limits  MAGNESIUM - Abnormal; Notable for the following components:   Magnesium 1.5 (*)    All other components within normal limits  BASIC METABOLIC PANEL - Abnormal; Notable for the following components:   Sodium 131 (*)    CO2 21 (*)    Glucose, Bld 137 (*)    BUN <5 (*)    Calcium 5.8 (*)    All other components  within normal limits  ALBUMIN - Abnormal; Notable for the following components:   Albumin 2.5 (*)    All other components within normal limits  SARS CORONAVIRUS 2 BY RT PCR  URINE CULTURE  LIPASE, BLOOD  PHOSPHORUS  VITAMIN B12  BASIC METABOLIC PANEL     EKG EKG: Normal sinus rhythm, Triklo rate 95.  PR 152, QRS 98, QTc 510.  No acute ST elevations repress or any acute evidence of acute ischemic infarct.   RADIOLOGY CXR: No acute abnormality CT abdomen/pelvis consult**  I also independently reviewed and agree with radiologist interpretations.   PROCEDURES:  Critical Care performed: Yes, see critical care procedure note(s)   .Critical Care  Performed by: Shaune Pollack, MD Authorized by: Shaune Pollack, MD   Critical care provider statement:    Critical care time (minutes):  30   Critical care was necessary to treat or prevent imminent or life-threatening deterioration of the following conditions:  Metabolic crisis, cardiac failure, circulatory failure and respiratory failure   Critical care was time spent personally by me on the following activities:  Development of treatment plan with patient or surrogate, discussions with consultants, evaluation of patient's response to treatment, examination of patient, ordering and review of laboratory studies, ordering and review of radiographic studies, ordering and performing treatments and interventions, pulse oximetry, re-evaluation of patient's condition and review of old charts     MEDICATIONS ORDERED IN ED: Medications  thiamine (VITAMIN B1) injection 100 mg (100 mg Intravenous Not Given 11/28/22 1501)  acetaminophen (TYLENOL) tablet 500 mg (has no administration in time range)  propranolol (INDERAL) tablet 10-20 mg (has no administration in time range)  ALPRAZolam (XANAX) tablet 1 mg (has no administration in time range)  ondansetron (ZOFRAN) injection 4 mg (has no administration in time range)  cyclobenzaprine (FLEXERIL)  tablet 5 mg (has no administration in time range)  gabapentin (NEURONTIN) capsule 800 mg (800 mg Oral Given 11/28/22 1509)  enoxaparin (LOVENOX) injection 40 mg (has no administration in time range)  oxyCODONE (Oxy IR/ROXICODONE) immediate release tablet 5 mg (has no administration in time range)  thiamine (VITAMIN B1) tablet 100 mg (has no administration in time  range)  LORazepam (ATIVAN) tablet 1-4 mg (1 mg Oral Given 11/28/22 1728)    Or  LORazepam (ATIVAN) injection 1-4 mg ( Intravenous See Alternative 11/28/22 1728)  folic acid (FOLVITE) tablet 1 mg (1 mg Oral Given 11/28/22 1459)  multivitamin with minerals tablet 1 tablet (1 tablet Oral Given 11/28/22 1459)  pantoprazole (PROTONIX) EC tablet 40 mg (40 mg Oral Given 11/28/22 1459)  folic acid 1 mg in sodium chloride 0.9 % 50 mL IVPB (has no administration in time range)  magnesium sulfate IVPB 4 g 100 mL (has no administration in time range)  morphine (PF) 4 MG/ML injection 4 mg (4 mg Intravenous Given 11/28/22 0953)  ondansetron (ZOFRAN) injection 4 mg (4 mg Intravenous Given 11/28/22 0951)  sodium chloride 0.9 % bolus 1,000 mL (0 mLs Intravenous Stopped 11/28/22 1301)  iohexol (OMNIPAQUE) 300 MG/ML solution 100 mL (75 mLs Intravenous Contrast Given 11/28/22 1031)  calcium gluconate 1 g/ 50 mL sodium chloride IVPB (0 mg Intravenous Stopped 11/28/22 1329)  potassium chloride SA (KLOR-CON M) CR tablet 40 mEq (40 mEq Oral Given 11/28/22 1125)  potassium chloride 10 mEq in 100 mL IVPB (0 mEq Intravenous Stopped 11/28/22 1729)  magnesium sulfate IVPB 2 g 50 mL (0 g Intravenous Stopped 11/28/22 1156)  cefTRIAXone (ROCEPHIN) 2 g in sodium chloride 0.9 % 100 mL IVPB (0 g Intravenous Stopped 11/28/22 1606)  0.9 %  sodium chloride infusion ( Intravenous Stopped 11/28/22 1514)  potassium chloride SA (KLOR-CON M) CR tablet 40 mEq (40 mEq Oral Given 11/28/22 1459)  morphine (PF) 2 MG/ML injection 2 mg (2 mg Intravenous Given 11/28/22 1427)  sodium chloride 0.9 %  bolus 1,000 mL (0 mLs Intravenous Stopped 11/28/22 1505)     IMPRESSION / MDM / ASSESSMENT AND PLAN / ED COURSE  I reviewed the triage vital signs and the nursing notes.                              Differential diagnosis includes, but is not limited to, alcoholic gastritis, PUD, obstruction, enteritis, food-borne illness, viral/bacterial enteritis  Patient's presentation is most consistent with acute presentation with potential threat to life or bodily function.  The patient is on the cardiac monitor to evaluate for evidence of arrhythmia and/or significant heart rate changes  62 yo F here with  nausea, vomiting, generalized weakness. Suspect alcoholic gastritis, dehydration. Pt labs show severe hypokalemia of 2.4, hypocalcemia of 6.1, hypomagnesemia, and likely mild alcoholic ketoacidosis. CBC shows normal WBC. CT A/P obtained, reviewed, and shows no acute surgical abnormality. Will admit for electrolyte repletion and rehydration.   FINAL CLINICAL IMPRESSION(S) / ED DIAGNOSES   Final diagnoses:  None     Rx / DC Orders   ED Discharge Orders     None        Note:  This document was prepared using Dragon voice recognition software and may include unintentional dictation errors.   Shaune Pollack, MD 11/28/22 2003

## 2022-11-28 NOTE — ED Notes (Signed)
This RN sent labs with "save this tube stickers"

## 2022-11-28 NOTE — ED Notes (Signed)
Lab called with critical lab results, K=2.4 and Ca= 6.1; Add on magnesium order sent to lab. EDP notified.

## 2022-11-28 NOTE — ED Triage Notes (Signed)
62 yo female presents to the ED via [facility] for nausea, some vomiting and dry heaves ongoing for 2 days. Last time she ate was 2 days ago,. Pt takes medications for hiatal hernia but hasn't recently been taking them. Pt reports she does drink alcohol as well.

## 2022-11-28 NOTE — TOC Initial Note (Signed)
Transition of Care [facility]) - Initial/Assessment Note    Patient Details  Name: Kathy [name] MRN: [id number] Date of Birth: 1960-03-05  [facility] of [facility] CM/SW Contact:    Colette Ribas, LCSWA Phone Number: 11/28/2022, 5:10 PM  Clinical Narrative:                    CSW met with patient for consult on SA resources. SA resources added to AVS.     Patient Goals and CMS Choice            Expected Discharge Plan and Services                                              Prior Living Arrangements/Services                       Activities of Daily Living      Permission Sought/Granted                  Emotional Assessment              Admission diagnosis:  Hypokalemia [E87.6] Patient Active Problem List   Diagnosis Date Noted   Hypocalcemia 11/28/2022   Skin tear of forearm without complication, initial encounter 07/01/2022   Tachycardia 07/01/2022   Alcohol use disorder 04/10/2022   Elevated lipase 04/10/2022   Left upper quadrant abdominal pain 04/10/2022   Bronchitis 12/12/2021   Hypoglycemia 05/28/2021   Status post laparoscopic cholecystectomy 05/08/2021   Abdominal aortic atherosclerosis (HCC) 12/21/2020   Thrombocytopenia (HCC) 10/13/2020   Alcoholic hepatitis 09/09/2020   Paraesophageal hiatal hernia 09/09/2020   Constipation 05/21/2020   Mild malnutrition (HCC) 05/20/2020   Mild episode of recurrent major depressive disorder (HCC) 05/20/2020   Essential hypertension 10/03/2019   Insomnia due to anxiety and fear 09/29/2019   GAD (generalized anxiety disorder) 09/19/2018   Poor dentition 05/18/2018   Tobacco abuse 02/22/2018   Atrophy of left kidney 02/22/2018   Diverticulosis of colon without diverticulitis 02/22/2018   MDD (major depressive disorder) 09/13/2017   Osteoporosis 10/17/2016   Pernicious anemia 01/01/2016   Hypomagnesemia 01/01/2016   Hyponatremia 09/10/2015   Hammertoe 07/31/2015    Encounter for preventive health examination 04/23/2015   Alcoholic peripheral neuropathy (HCC) 04/23/2015   Hypokalemia 10/13/2014   Fatty liver, alcoholic 10/13/2014   Alcohol dependence with alcohol-induced mood disorder (HCC)    Alcoholic ketoacidosis 07/12/2014   PCP:  Sherlene Shams, MD Pharmacy:   [facility], [place] - [address] [place] [address] Phone: [phone] Fax: [phone]  [facility] - [place], [place] - [address] [place] [address] Phone: [phone] Fax: [phone]  [facility] - [place], [place] - [address] [place] [address] Phone: [phone] Fax: [phone]  [facility] #[id number] [place], [place] - [address] AT [facility] [address] [place] [address] Phone: [phone] Fax: [phone]  [facility] #[id number] [place], [place] - [address] AT [facility] [address] [place] [address] Phone: [phone] Fax: [phone]     Social Determinants of Health (SDOH) Social History: SDOH Screenings   Food Insecurity: No Food Insecurity (09/29/2022)  Housing:  Low Risk  (09/29/2022)  Transportation Needs: No Transportation Needs (09/29/2022)  Utilities: Not At Risk (09/29/2022)  Alcohol Screen: Low Risk  (09/29/2022)  Depression (PHQ2-9): Low Risk  (09/29/2022)  Financial Resource Strain: Low Risk  (09/29/2022)  Physical Activity: Insufficiently Active (09/29/2022)  Social Connections: Moderately Isolated (09/29/2022)  Stress: No Stress Concern Present (09/29/2022)  Tobacco Use: High Risk (11/28/2022)  Health Literacy: Adequate Health Literacy (09/29/2022)   SDOH Interventions:     Readmission Risk Interventions     No data to display

## 2022-11-28 NOTE — ED Notes (Signed)
ED TO INPATIENT HANDOFF REPORT  ED Nurse Name and Phone #: Dellie Burns, [phone]  S Name/Age/Gender Kathy [name] 62 y.o. female Room/Bed: ED09A/ED09A  Code Status   Code Status: Full Code  Home/SNF/Other  Patient oriented to: self, place, time, and situation Is this baseline? Yes   Triage Complete: Triage complete  Chief Complaint Hypokalemia [E87.6]  Triage Note 62 yo female presents to the ED via [facility] for nausea, some vomiting and dry heaves ongoing for 2 days. Last time she ate was 2 days ago,. Pt takes medications for hiatal hernia but hasn't recently been taking them. Pt reports she does drink alcohol as well.    Allergies Allergies  Allergen Reactions   Buspirone Hives   Lyrica [Pregabalin] Other (See Comments)    Blurred vision     Level of Care/Admitting Diagnosis ED Disposition     ED Disposition  Admit   Condition  --   Comment  Hospital Area: [facility] [[id number]]  Level of Care: Telemetry Medical [104]  Covid Evaluation: Asymptomatic - no recent exposure (last 10 days) testing not required  Diagnosis: Hypokalemia [172180]  Admitting Physician: Emeline General [[id number]]  Attending Physician: Emeline General [[id number]]          B Medical/Surgery History Past Medical History:  Diagnosis Date   Abdominal aortic atherosclerosis (HCC)    Abnormal weight loss    Alcohol abuse    Alcohol abuse with withdrawal (HCC) 10/13/2020   Allergic rhinitis    Anxiety    Bronchitis    Chicken pox    Colon polyps    COPD (chronic obstructive pulmonary disease) (HCC)    Depression    Dyspnea    Elevated lipase    Fatty liver    Gallbladder mass 09/09/2020   8 x 6 x 6 mm non mobile  mass along GB wall noted on 2018 [place] 8 x  7 x 5 mm non mobile mass along GB wall noted on July  2022 [place]   GERD (gastroesophageal reflux disease)    Hepatitis    Hernia of abdominal cavity    Hip fracture (HCC) right   Left upper quadrant abdominal pain     Neuromuscular disorder (HCC)    Neuropathy    lower legs   Thrombocytopenia (HCC)    Wears dentures    full upper and lower   Past Surgical History:  Procedure Laterality Date   BIOPSY  08/31/2022   Procedure: BIOPSY;  Surgeon: Regis Bill, MD;  Location: [facility];  Service: Endoscopy;;   CATARACT EXTRACTION W/PHACO Right 07/29/2021   Procedure: CATARACT EXTRACTION PHACO AND INTRAOCULAR LENS PLACEMENT (IOC) RIGHT 19.12 01:27.4;  Surgeon: Galen Manila, MD;  Location: [facility];  Service: Ophthalmology;  Laterality: Right;   CATARACT EXTRACTION W/PHACO Left 08/12/2021   Procedure: CATARACT EXTRACTION PHACO AND INTRAOCULAR LENS PLACEMENT (IOC) LEFT 13.10 01:05.3;  Surgeon: Galen Manila, MD;  Location: [facility];  Service: Ophthalmology;  Laterality: Left;   CHOLECYSTECTOMY     COLONOSCOPY     COLONOSCOPY WITH PROPOFOL N/A 08/31/2022   Procedure: COLONOSCOPY WITH PROPOFOL;  Surgeon: Regis Bill, MD;  Location: [facility];  Service: Endoscopy;  Laterality: N/A;   ESOPHAGOGASTRODUODENOSCOPY (EGD) WITH PROPOFOL N/A 08/31/2022   Procedure: ESOPHAGOGASTRODUODENOSCOPY (EGD) WITH PROPOFOL;  Surgeon: Regis Bill, MD;  Location: [facility];  Service: Endoscopy;  Laterality: N/A;   EYE SURGERY     POLYPECTOMY  08/31/2022   Procedure: POLYPECTOMY;  Surgeon:  Regis Bill, MD;  Location: [facility];  Service: Endoscopy;;   ROBOTIC ASSISTED LAPAROSCOPIC CHOLECYSTECTOMY  04/16/2021   [facility]     A IV Location/Drains/Wounds Patient Lines/Drains/Airways Status     Active Line/Drains/Airways     Name Placement date Placement time Site Days   Peripheral IV 11/28/22 20 G 1" Right Antecubital 11/28/22  0843  Antecubital  less than 1   Peripheral IV 11/28/22 24 G Anterior;Distal;Right Forearm 11/28/22  1259  Forearm  less than 1            Intake/Output Last 24 hours  Intake/Output Summary (Last 24 hours) at 11/28/2022 1447 Last  data filed at 11/28/2022 1329 Gross per 24 hour  Intake 1199.43 ml  Output --  Net 1199.43 ml    Labs/Imaging Results for orders placed or performed during the hospital encounter of 11/28/22 (from the past 48 hour(s))  CBC with Differential     Status: Abnormal   Collection Time: 11/28/22  8:56 AM  Result Value Ref Range   WBC 5.9 4.0 - 10.5 K/uL   RBC 2.56 (L) 3.87 - 5.11 MIL/uL   Hemoglobin 9.2 (L) 12.0 - 15.0 g/dL   HCT 96.2 (L) 95.2 - 84.1 %   MCV 103.5 (H) 80.0 - 100.0 fL   MCH 35.9 (H) 26.0 - 34.0 pg   MCHC 34.7 30.0 - 36.0 g/dL   RDW 32.4 40.1 - 02.7 %   Platelets 153 150 - 400 K/uL   nRBC 0.0 0.0 - 0.2 %   Neutrophils Relative % 58 %   Neutro Abs 3.4 1.7 - 7.7 K/uL   Lymphocytes Relative 32 %   Lymphs Abs 1.9 0.7 - 4.0 K/uL   Monocytes Relative 8 %   Monocytes Absolute 0.5 0.1 - 1.0 K/uL   Eosinophils Relative 1 %   Eosinophils Absolute 0.0 0.0 - 0.5 K/uL   Basophils Relative 1 %   Basophils Absolute 0.1 0.0 - 0.1 K/uL   Immature Granulocytes 0 %   Abs Immature Granulocytes 0.02 0.00 - 0.07 K/uL    Comment: Performed at [facility], [address], [place], [place] [address]  Comprehensive metabolic panel     Status: Abnormal   Collection Time: 11/28/22  8:56 AM  Result Value Ref Range   Sodium 132 (L) 135 - 145 mmol/L   Potassium 2.4 (LL) 3.5 - 5.1 mmol/L    Comment: CRITICAL RESULT CALLED TO, READ BACK BY AND VERIFIED WITH JESS Lenda Baratta AT 1012 11/28/22.PMF    Chloride 94 (L) 98 - 111 mmol/L   CO2 22 22 - 32 mmol/L   Glucose, Bld 136 (H) 70 - 99 mg/dL    Comment: Glucose reference range applies only to samples taken after fasting for at least 8 hours.   BUN <5 (L) 6 - 20 mg/dL   Creatinine, Ser 4.40 0.44 - 1.00 mg/dL   Calcium 6.1 (LL) 8.9 - 10.3 mg/dL    Comment: CRITICAL RESULT CALLED TO, READ BACK BY AND VERIFIED WITH JESS Ulis Kaps AT 1012 11/28/22.PMF    Total Protein 5.8 (L) 6.5 - 8.1 g/dL   Albumin 2.4 (L) 3.5 - 5.0 g/dL   AST 347 (H) 15 - 41  U/L   ALT 25 0 - 44 U/L   Alkaline Phosphatase 94 38 - 126 U/L   Total Bilirubin 1.5 (H) 0.3 - 1.2 mg/dL   GFR, Estimated >42 >59 mL/min    Comment: (NOTE) Calculated using the CKD-EPI Creatinine Equation (2021)  Anion gap 16 (H) 5 - 15    Comment: Performed at [facility], [address] [place], [place] [address]  Lipase, blood     Status: None   Collection Time: 11/28/22  8:56 AM  Result Value Ref Range   Lipase 34 11 - 51 U/L    Comment: Performed at [facility], [address] [place], [place] [address]  Magnesium     Status: Abnormal   Collection Time: 11/28/22  8:56 AM  Result Value Ref Range   Magnesium 0.9 (LL) 1.7 - 2.4 mg/dL    Comment: CRITICAL RESULT CALLED TO, READ BACK BY AND VERIFIED WITH JESS Oneal Biglow AT 1108 11/28/22.PMF Performed at [facility] [address] [place], [place] [address]   Reticulocytes     Status: Abnormal   Collection Time: 11/28/22  8:56 AM  Result Value Ref Range   Retic Ct Pct 1.9 0.4 - 3.1 %   RBC. 2.63 (L) 3.87 - 5.11 MIL/uL   Retic Count, Absolute 50.2 19.0 - 186.0 K/uL   Immature Retic Fract 17.5 (H) 2.3 - 15.9 %    Comment: Performed at [facility], [address] [place], [place] [address]  Urinalysis, Routine w reflex microscopic -Urine, Clean Catch     Status: Abnormal   Collection Time: 11/28/22 10:51 AM  Result Value Ref Range   Color, Urine YELLOW (A) YELLOW   APPearance HAZY (A) CLEAR   Specific Gravity, Urine 1.004 (L) 1.005 - 1.030   pH 7.0 5.0 - 8.0   Glucose, UA NEGATIVE NEGATIVE mg/dL   Hgb urine dipstick NEGATIVE NEGATIVE   Bilirubin Urine NEGATIVE NEGATIVE   Ketones, ur NEGATIVE NEGATIVE mg/dL   Protein, ur NEGATIVE NEGATIVE mg/dL   Nitrite NEGATIVE NEGATIVE   Leukocytes,Ua LARGE (A) NEGATIVE   RBC / HPF 0-5 0 - 5 RBC/hpf   WBC, UA 6-10 0 - 5 WBC/hpf   Bacteria, UA MANY (A) NONE SEEN   Squamous Epithelial / HPF 0-5 0 - 5 /HPF    Comment: Performed at [facility], [address], [place], [place] [address]  Phosphorus     Status: None   Collection Time: 11/28/22 11:00 AM  Result Value Ref Range   Phosphorus 3.1 2.5 - 4.6 mg/dL    Comment: Performed at [facility], [address] [place], [place] [address]  Iron and TIBC     Status: Abnormal   Collection Time: 11/28/22 11:00 AM  Result Value Ref Range   Iron 145 28 - 170 ug/dL   TIBC 578 (L) 469 - 629 ug/dL   Saturation Ratios 93 (H) 10.4 - 31.8 %   UIBC 12 ug/dL    Comment: Performed at [facility] [address] [place], [place] [address]  Ferritin     Status: Abnormal   Collection Time: 11/28/22 11:00 AM  Result Value Ref Range   Ferritin 354 (H) 11 - 307 ng/mL    Comment: Performed at [facility], [address] [place], [place] [address]  Folate     Status: Abnormal   Collection Time: 11/28/22 11:00 AM  Result Value Ref Range   Folate 5.3 (L) >5.9 ng/mL    Comment: Performed at [facility], [address] [place], [place] [address]  SARS Coronavirus 2 by RT PCR (hospital order, performed in [facility] hospital lab) *cepheid single result test* Anterior Nasal Swab     Status: None   Collection Time: 11/28/22  12:32 PM   Specimen: Anterior Nasal Swab  Result Value Ref Range   SARS Coronavirus 2 by RT PCR NEGATIVE NEGATIVE    Comment: (NOTE) SARS-CoV-2 target nucleic acids are NOT DETECTED.  The SARS-CoV-2 RNA is generally detectable in upper and lower respiratory specimens during the acute phase of infection. The lowest concentration of SARS-CoV-2 viral copies this assay can detect is 250 copies / mL. A negative result does not preclude SARS-CoV-2 infection and should not be used as the sole basis for treatment or other patient management decisions.  A negative result may occur with improper specimen collection / handling, submission of specimen other than nasopharyngeal swab, presence of viral mutation(s) within  the areas targeted by this assay, and inadequate number of viral copies (<250 copies / mL). A negative result must be combined with clinical observations, patient history, and epidemiological information.  Fact Sheet for Patients:   RoadLapTop.co.za  Fact Sheet for Healthcare Providers: http://kim-miller.com/  This test is not yet approved or  cleared by the [place] FDA and has been authorized for detection and/or diagnosis of SARS-CoV-2 by FDA under an Emergency Use Authorization (EUA).  This EUA will remain in effect (meaning this test can be used) for the duration of the COVID-19 declaration under Section 564(b)(1) of the Act, 21 U.S.C. section 360bbb-3(b)(1), unless the authorization is terminated or revoked sooner.  Performed at [facility], [address] [place], [place] [address]    CT ABDOMEN PELVIS W CONTRAST  Result Date: 11/28/2022 CLINICAL DATA:  Acute abdominal pain. Nausea and vomiting for 2 days. Hiatal hernia. EXAM: CT ABDOMEN AND PELVIS WITH CONTRAST TECHNIQUE: Multidetector CT imaging of the abdomen and pelvis was performed using the standard protocol following bolus administration of intravenous contrast. RADIATION DOSE REDUCTION: This exam was performed according to the departmental dose-optimization program which includes automated exposure control, adjustment of the mA and/or kV according to patient size and/or use of iterative reconstruction technique. CONTRAST:  75mL OMNIPAQUE IOHEXOL 300 MG/ML  SOLN COMPARISON:  04/10/2022 FINDINGS: Lower Chest: No acute findings. Hepatobiliary: No suspicious hepatic masses identified. Hepatomegaly and severe diffuse hepatic steatosis is increased since previous study. Focal fatty sparing is seen in the left hepatic lobe. Prior cholecystectomy again noted. Extrahepatic biliary ductal dilatation remains stable. Pancreas:  No mass or inflammatory changes. Spleen: Within  normal limits in size and appearance. Adrenals/Urinary Tract: No suspicious masses identified. Chronic severe left renal atrophy with dilated renal collecting system remains unchanged. No evidence of ureteral calculi or dilatation. Unremarkable unopacified urinary bladder. Stomach/Bowel: Large hiatal hernia is again seen but incompletely visualized on this exam. No evidence of obstruction, inflammatory process or abnormal fluid collections. Normal appendix visualized. Diverticulosis is seen mainly involving the descending and sigmoid colon, however there is no evidence of diverticulitis. Vascular/Lymphatic: No pathologically enlarged lymph nodes. No acute vascular findings. Reproductive:  No mass or other significant abnormality. Other:  None. Musculoskeletal:  No suspicious bone lesions identified. IMPRESSION: Increased hepatomegaly and severe diffuse hepatic steatosis. Large hiatal hernia. Stable chronic severe left renal atrophy. Colonic diverticulosis, without radiographic evidence of diverticulitis. Electronically Signed   By: Danae Orleans M.D.   On: 11/28/2022 11:14   DG Chest Portable 1 View  Result Date: 11/28/2022 CLINICAL DATA:  Shortness of breath and cough EXAM: PORTABLE CHEST 1 VIEW COMPARISON:  X-ray 12/12/2021 FINDINGS: Eventration of the right hemidiaphragm. No consolidation, pneumothorax or effusion. Normal cardiopericardial silhouette with a calcified aorta. No edema. Overlapping cardiac leads. Old bilateral rib fractures  are seen. Moderate hiatal hernia. IMPRESSION: No acute cardiopulmonary disease. Hiatal hernia. Old rib fractures. Electronically Signed   By: Karen Kays M.D.   On: 11/28/2022 10:12    Pending Labs Unresulted Labs (From admission, onward)     Start     Ordered   11/29/22 0500  Basic metabolic panel  Tomorrow morning,   R        11/28/22 1221   11/28/22 1500  Basic metabolic panel  Once-Timed,   TIMED        11/28/22 1445   11/28/22 1300  Magnesium  Once,   TIMED         11/28/22 1300   11/28/22 1250  Vitamin B12  Add-on,   AD        11/28/22 1249   11/28/22 1115  Urine Culture  Once,   URGENT       Question:  Indication  Answer:  Dysuria   11/28/22 1114            Vitals/Pain Today's Vitals   11/28/22 1330 11/28/22 1400 11/28/22 1425 11/28/22 1434  BP: 95/70 108/68  118/72  Pulse: (!) 109 (!) 106  (!) 109  Resp:      Temp:   98.3 F (36.8 C)   TempSrc:   Oral   SpO2: 100% 100%    Weight:      Height:      PainSc:        Isolation Precautions Airborne and Contact precautions  Medications Medications  potassium chloride 10 mEq in 100 mL IVPB (10 mEq Intravenous New Bag/Given 11/28/22 1312)  cefTRIAXone (ROCEPHIN) 2 g in sodium chloride 0.9 % 100 mL IVPB (2 g Intravenous New Bag/Given 11/28/22 1433)  thiamine (VITAMIN B1) injection 100 mg (has no administration in time range)  0.9 %  sodium chloride infusion (has no administration in time range)  acetaminophen (TYLENOL) tablet 500 mg (has no administration in time range)  propranolol (INDERAL) tablet 10-20 mg (has no administration in time range)  ALPRAZolam (XANAX) tablet 1 mg (has no administration in time range)  ondansetron (ZOFRAN) injection 4 mg (has no administration in time range)  cyclobenzaprine (FLEXERIL) tablet 5 mg (has no administration in time range)  gabapentin (NEURONTIN) capsule 800 mg (has no administration in time range)  enoxaparin (LOVENOX) injection 40 mg (has no administration in time range)  oxyCODONE (Oxy IR/ROXICODONE) immediate release tablet 5 mg (has no administration in time range)  thiamine (VITAMIN B1) tablet 100 mg (has no administration in time range)  LORazepam (ATIVAN) tablet 1-4 mg (has no administration in time range)    Or  LORazepam (ATIVAN) injection 1-4 mg (has no administration in time range)  folic acid (FOLVITE) tablet 1 mg (has no administration in time range)  multivitamin with minerals tablet 1 tablet (has no administration in time  range)  potassium chloride SA (KLOR-CON M) CR tablet 40 mEq (has no administration in time range)  pantoprazole (PROTONIX) EC tablet 40 mg (has no administration in time range)  morphine (PF) 4 MG/ML injection 4 mg (4 mg Intravenous Given 11/28/22 0953)  ondansetron (ZOFRAN) injection 4 mg (4 mg Intravenous Given 11/28/22 0951)  sodium chloride 0.9 % bolus 1,000 mL (0 mLs Intravenous Stopped 11/28/22 1301)  iohexol (OMNIPAQUE) 300 MG/ML solution 100 mL (75 mLs Intravenous Contrast Given 11/28/22 1031)  calcium gluconate 1 g/ 50 mL sodium chloride IVPB (0 mg Intravenous Stopped 11/28/22 1329)  potassium chloride SA (KLOR-CON M) CR tablet 40 mEq (  40 mEq Oral Given 11/28/22 1125)  magnesium sulfate IVPB 2 g 50 mL (0 g Intravenous Stopped 11/28/22 1156)  morphine (PF) 2 MG/ML injection 2 mg (2 mg Intravenous Given 11/28/22 1427)  sodium chloride 0.9 % bolus 1,000 mL (1,000 mLs Intravenous New Bag/Given 11/28/22 1302)    Mobility walks with person assist     Focused Assessments Cardiac Assessment Handoff:  Cardiac Rhythm: Sinus tachycardia Lab Results  Component Value Date   TROPONINI <0.03 09/02/2015   Lab Results  Component Value Date   DDIMER 2.16 (H) 05/15/2021   Does the Patient currently have chest pain? Yes   , Neuro Assessment Handoff:  Swallow screen pass?  Cardiac Rhythm: Sinus tachycardia       Neuro Assessment: Within Defined Limits Neuro Checks:   Alert and orientedx4   , Renal Assessment Handoff:  , Pulmonary Assessment Handoff:  Lung sounds:   O2 Device: Room Air      R Recommendations: See Admitting Provider Note  Report given to:   Additional Notes:

## 2022-11-28 NOTE — Progress Notes (Signed)
Date and time results received: 11/28/22 1851 (use smartphrase ".now" to insert current time)  Test: Calcium Critical Value: 5.8  Name of Provider Notified: Manuela Schwartz PA  Orders Received? Or Actions Taken?:  Orders to be entered by Manuela Schwartz PA oncoming shift notified.

## 2022-11-28 NOTE — H&P (Signed)
History and Physical    Kathy [name] XLK:[id number] DOB: August 29, 1960 DOA: 11/28/2022  PCP: Sherlene Shams, MD (Confirm with patient/family/NH records and if not entered, this has to be entered at [facility] point of entry) Patient coming from: Home  I have personally briefly reviewed patient's old medical records in [facility]  Chief Complaint: Epigastric pain, nauseous vomiting  HPI: Devonya Nees is a 62 y.o. female with medical history significant of alcohol abuse with chronic alcoholic gastritis with pernicious anemia on chronic B12 injection, HTN, COPD, HLD, hiatal hernia, GERD, chronic peripheral neuropathy, presented with worsening of nauseous vomiting abdominal pain.  Symptoms started 3 to 4 days ago, patient developed cramping-like epigastric pain with frequent feeling of nauseous vomiting of stomach content, denies any diarrhea no fever or chills.  She could barely tolerate any food, and she threw up her pills as well but able to take only water.  She was told by her PCP on last visit " potassium and calcium levels are low:" And she was prescribed with calcium and potassium supplement however she could not take any of those supplement pills for last 3+ days.  This morning she became very weak and feeling dizzy and decided to come to ED.  ED Course: Afebrile, nontachycardic nonhypotensive.  CT abdominal pelvis showed no acute findings.  Blood work showed K2.4, calcium 6.1, ALB 2.4 AST 126.  Lipase 34.  Magnesium 2.9  Patient was given IV magnesium, IV potassium and IV calcium gluconate in the ED  Review of Systems: As per HPI otherwise 14 point review of systems negative.    Past Medical History:  Diagnosis Date   Abdominal aortic atherosclerosis (HCC)    Abnormal weight loss    Alcohol abuse    Alcohol abuse with withdrawal (HCC) 10/13/2020   Allergic rhinitis    Anxiety    Bronchitis    Chicken pox    Colon polyps    COPD (chronic obstructive pulmonary disease)  (HCC)    Depression    Dyspnea    Elevated lipase    Fatty liver    Gallbladder mass 09/09/2020   8 x 6 x 6 mm non mobile  mass along GB wall noted on 2018 [place] 8 x  7 x 5 mm non mobile mass along GB wall noted on July  2022 [place]   GERD (gastroesophageal reflux disease)    Hepatitis    Hernia of abdominal cavity    Hip fracture (HCC) right   Left upper quadrant abdominal pain    Neuromuscular disorder (HCC)    Neuropathy    lower legs   Thrombocytopenia (HCC)    Wears dentures    full upper and lower    Past Surgical History:  Procedure Laterality Date   BIOPSY  08/31/2022   Procedure: BIOPSY;  Surgeon: Regis Bill, MD;  Location: [facility] ENDOSCOPY;  Service: Endoscopy;;   CATARACT EXTRACTION W/PHACO Right 07/29/2021   Procedure: CATARACT EXTRACTION PHACO AND INTRAOCULAR LENS PLACEMENT (IOC) RIGHT 19.12 01:27.4;  Surgeon: Galen Manila, MD;  Location: [facility];  Service: Ophthalmology;  Laterality: Right;   CATARACT EXTRACTION W/PHACO Left 08/12/2021   Procedure: CATARACT EXTRACTION PHACO AND INTRAOCULAR LENS PLACEMENT (IOC) LEFT 13.10 01:05.3;  Surgeon: Galen Manila, MD;  Location: [facility];  Service: Ophthalmology;  Laterality: Left;   CHOLECYSTECTOMY     COLONOSCOPY     COLONOSCOPY WITH PROPOFOL N/A 08/31/2022   Procedure: COLONOSCOPY WITH PROPOFOL;  Surgeon: Regis Bill, MD;  Location: [facility] ENDOSCOPY;  Service: Endoscopy;  Laterality: N/A;   ESOPHAGOGASTRODUODENOSCOPY (EGD) WITH PROPOFOL N/A 08/31/2022   Procedure: ESOPHAGOGASTRODUODENOSCOPY (EGD) WITH PROPOFOL;  Surgeon: Regis Bill, MD;  Location: [facility] ENDOSCOPY;  Service: Endoscopy;  Laterality: N/A;   EYE SURGERY     POLYPECTOMY  08/31/2022   Procedure: POLYPECTOMY;  Surgeon: Regis Bill, MD;  Location: [facility] ENDOSCOPY;  Service: Endoscopy;;   ROBOTIC ASSISTED LAPAROSCOPIC CHOLECYSTECTOMY  04/16/2021   [facility]     reports that she has been smoking cigarettes. She has a 20  pack-year smoking history. She has never used smokeless tobacco. She reports that she does not drink alcohol and does not use drugs.  Allergies  Allergen Reactions   Buspirone Hives   Lyrica [Pregabalin] Other (See Comments)    Blurred vision     Family History  Problem Relation Age of Onset   Hypertension Mother    Osteoporosis Mother    Meniere's disease Mother    Cancer Father 45       prostate   Colon cancer Paternal Grandmother    Alcohol abuse Other    Breast cancer Neg Hx     Prior to Admission medications   Medication Sig Start Date End Date Taking? Authorizing Provider  acetaminophen (TYLENOL) 500 MG tablet Take 500 mg by mouth every 6 (six) hours as needed.    [provider]  ALPRAZolam Prudy Feeler) 0.5 MG tablet Take 1 mg by mouth at bedtime as needed for anxiety.    [provider]  amLODipine (NORVASC) 5 MG tablet TAKE 1 TABLET BY MOUTH TWICE DAILY Patient not taking: Reported on 09/29/2022 06/18/22   Sherlene Shams, MD  cyanocobalamin (,VITAMIN B-12,) 1000 MCG/ML injection Inject 1 mL (1,000 mcg total) into the muscle every 14 (fourteen) days. 06/21/19   Sherlene Shams, MD  cyclobenzaprine (FLEXERIL) 10 MG tablet TAKE 1 TABLET BY MOUTH THREE TIMES DAILYAS NEEDED FOR MUSCLE SPASMS INVOLVING LEGS AND FEET 11/28/21   Sherlene Shams, MD  denosumab (PROLIA) 60 MG/ML SOSY injection Inject 60 mg into the skin every 6 (six) months.    [provider]  DULoxetine (CYMBALTA) 20 MG capsule Take 20 mg by mouth daily. Patient not taking: Reported on 09/29/2022    [provider]  gabapentin (NEURONTIN) 400 MG capsule TAKE 2 CAPSULES (800 MG) BY MOUTH 3 TIMES DAILY 05/27/22   Sherlene Shams, MD  hydrOXYzine (ATARAX) 10 MG tablet Take 10 mg by mouth 3 (three) times daily as needed. Patient not taking: Reported on 09/29/2022    [provider]  omeprazole (PRILOSEC) 40 MG capsule TAKE ONE CAPSULE BY MOUTH TWO TIMES DAILY 10/07/22   Sherlene Shams, MD   ondansetron (ZOFRAN-ODT) 8 MG disintegrating tablet TAKE ONE TABLET UNDER THE TONGUE EVERY 8HOURS AS NEEDED FOR NAUSEA AND VOMITING. 08/29/22   Sherlene Shams, MD  pediatric multivitamin + iron (POLY-VI-SOL + IRON) 11 MG/ML SOLN oral solution Take 1 mL by mouth daily. 10/24/21   Sherlene Shams, MD  potassium chloride SA (KLOR-CON M) 20 MEQ tablet Take 1 tablet (20 mEq total) by mouth daily. 07/11/22   Sherlene Shams, MD  propranolol (INDERAL) 10 MG tablet Take 1-2 tablets (10-20 mg total) by mouth 2 (two) times daily as needed (anxiety). 07/03/22   Allegra Grana, FNP  Syringe/Needle, Disp, (SYRINGE 3CC/25GX1") 25G X 1" 3 ML MISC Use for b12 injections 06/21/19   Sherlene Shams, MD  thiamine (VITAMIN B1) 100 MG  tablet Take 1 tablet (100 mg total) by mouth daily. 04/11/22   Willeen Niece, MD  traMADol (ULTRAM) 50 MG tablet Take by mouth every 6 (six) hours as needed. Patient not taking: Reported on 09/29/2022    [provider]  traZODone (DESYREL) 100 MG tablet Take 100 mg by mouth at bedtime. Patient not taking: Reported on 09/29/2022    [provider]    Physical Exam: Vitals:   11/28/22 0847 11/28/22 0848 11/28/22 0849  BP:  (!) 144/77   Resp: 13    Temp: 98.5 F (36.9 C)    TempSrc: Oral    SpO2: 100% 99%   Weight:   55 kg  Height:   5\' 2"  (1.575 m)    Constitutional: NAD, calm, comfortable Vitals:   11/28/22 0847 11/28/22 0848 11/28/22 0849  BP:  (!) 144/77   Resp: 13    Temp: 98.5 F (36.9 C)    TempSrc: Oral    SpO2: 100% 99%   Weight:   55 kg  Height:   5\' 2"  (1.575 m)   Eyes: PERRL, lids and conjunctivae normal ENMT: Mucous membranes are moist. Posterior pharynx clear of any exudate or lesions.Normal dentition.  Neck: normal, supple, no masses, no thyromegaly Respiratory: clear to auscultation bilaterally, no wheezing, no crackles. Normal respiratory effort. No accessory muscle use.  Cardiovascular: Regular rate and rhythm, no murmurs / rubs /  gallops. No extremity edema. 2+ pedal pulses. No carotid bruits.  Abdomen: no tenderness, no masses palpated. No hepatosplenomegaly. Bowel sounds positive.  Musculoskeletal: no clubbing / cyanosis. No joint deformity upper and lower extremities. Good ROM, no contractures. Normal muscle tone.  Skin: no rashes, lesions, ulcers. No induration Neurologic: CN 2-12 grossly intact. Sensation intact, DTR normal. Strength 5/5 in all 4.  Psychiatric: Normal judgment and insight. Alert and oriented x 3. Normal mood.     Labs on Admission: I have personally reviewed following labs and imaging studies  CBC: Recent Labs  Lab 11/28/22 0856  WBC 5.9  NEUTROABS 3.4  HGB 9.2*  HCT 26.5*  MCV 103.5*  PLT 153   Basic Metabolic Panel: Recent Labs  Lab 11/28/22 0856  NA 132*  K 2.4*  CL 94*  CO2 22  GLUCOSE 136*  BUN <5*  CREATININE 0.53  CALCIUM 6.1*  MG 0.9*   GFR: Estimated Creatinine Clearance: 59.1 mL/min (by C-G formula based on SCr of 0.53 mg/dL). Liver Function Tests: Recent Labs  Lab 11/28/22 0856  AST 126*  ALT 25  ALKPHOS 94  BILITOT 1.5*  PROT 5.8*  ALBUMIN 2.4*   Recent Labs  Lab 11/28/22 0856  LIPASE 34   No results for input(s): "AMMONIA" in the last 168 hours. Coagulation Profile: No results for input(s): "INR", "PROTIME" in the last 168 hours. Cardiac Enzymes: No results for input(s): "CKTOTAL", "CKMB", "CKMBINDEX", "TROPONINI" in the last 168 hours. BNP (last 3 results) No results for input(s): "PROBNP" in the last 8760 hours. HbA1C: No results for input(s): "HGBA1C" in the last 72 hours. CBG: No results for input(s): "GLUCAP" in the last 168 hours. Lipid Profile: No results for input(s): "CHOL", "HDL", "LDLCALC", "TRIG", "CHOLHDL", "LDLDIRECT" in the last 72 hours. Thyroid Function Tests: No results for input(s): "TSH", "T4TOTAL", "FREET4", "T3FREE", "THYROIDAB" in the last 72 hours. Anemia Panel: No results for input(s): "VITAMINB12", "FOLATE",  "FERRITIN", "TIBC", "IRON", "RETICCTPCT" in the last 72 hours. Urine analysis:    Component Value Date/Time   COLORURINE YELLOW (A) 11/28/2022 1051   APPEARANCEUR  HAZY (A) 11/28/2022 1051   APPEARANCEUR Cloudy (A) 05/20/2020 1557   LABSPEC 1.004 (L) 11/28/2022 1051   PHURINE 7.0 11/28/2022 1051   GLUCOSEU NEGATIVE 11/28/2022 1051   GLUCOSEU 100 (A) 03/26/2022 1157   HGBUR NEGATIVE 11/28/2022 1051   BILIRUBINUR NEGATIVE 11/28/2022 1051   BILIRUBINUR Negative 05/20/2020 1557   KETONESUR NEGATIVE 11/28/2022 1051   PROTEINUR NEGATIVE 11/28/2022 1051   UROBILINOGEN >=8.0 (A) 03/26/2022 1157   NITRITE NEGATIVE 11/28/2022 1051   LEUKOCYTESUR LARGE (A) 11/28/2022 1051    Radiological Exams on Admission: CT ABDOMEN PELVIS W CONTRAST  Result Date: 11/28/2022 CLINICAL DATA:  Acute abdominal pain. Nausea and vomiting for 2 days. Hiatal hernia. EXAM: CT ABDOMEN AND PELVIS WITH CONTRAST TECHNIQUE: Multidetector CT imaging of the abdomen and pelvis was performed using the standard protocol following bolus administration of intravenous contrast. RADIATION DOSE REDUCTION: This exam was performed according to the departmental dose-optimization program which includes automated exposure control, adjustment of the mA and/or kV according to patient size and/or use of iterative reconstruction technique. CONTRAST:  75mL OMNIPAQUE IOHEXOL 300 MG/ML  SOLN COMPARISON:  04/10/2022 FINDINGS: Lower Chest: No acute findings. Hepatobiliary: No suspicious hepatic masses identified. Hepatomegaly and severe diffuse hepatic steatosis is increased since previous study. Focal fatty sparing is seen in the left hepatic lobe. Prior cholecystectomy again noted. Extrahepatic biliary ductal dilatation remains stable. Pancreas:  No mass or inflammatory changes. Spleen: Within normal limits in size and appearance. Adrenals/Urinary Tract: No suspicious masses identified. Chronic severe left renal atrophy with dilated renal collecting  system remains unchanged. No evidence of ureteral calculi or dilatation. Unremarkable unopacified urinary bladder. Stomach/Bowel: Large hiatal hernia is again seen but incompletely visualized on this exam. No evidence of obstruction, inflammatory process or abnormal fluid collections. Normal appendix visualized. Diverticulosis is seen mainly involving the descending and sigmoid colon, however there is no evidence of diverticulitis. Vascular/Lymphatic: No pathologically enlarged lymph nodes. No acute vascular findings. Reproductive:  No mass or other significant abnormality. Other:  None. Musculoskeletal:  No suspicious bone lesions identified. IMPRESSION: Increased hepatomegaly and severe diffuse hepatic steatosis. Large hiatal hernia. Stable chronic severe left renal atrophy. Colonic diverticulosis, without radiographic evidence of diverticulitis. Electronically Signed   By: Danae Orleans M.D.   On: 11/28/2022 11:14   DG Chest Portable 1 View  Result Date: 11/28/2022 CLINICAL DATA:  Shortness of breath and cough EXAM: PORTABLE CHEST 1 VIEW COMPARISON:  X-ray 12/12/2021 FINDINGS: Eventration of the right hemidiaphragm. No consolidation, pneumothorax or effusion. Normal cardiopericardial silhouette with a calcified aorta. No edema. Overlapping cardiac leads. Old bilateral rib fractures are seen. Moderate hiatal hernia. IMPRESSION: No acute cardiopulmonary disease. Hiatal hernia. Old rib fractures. Electronically Signed   By: Karen Kays M.D.   On: 11/28/2022 10:12    EKG: Independently reviewed.  Sinus, low voltage, no acute ST changes.  Assessment/Plan Principal Problem:   Hypokalemia Active Problems:   Alcohol use disorder   Hypocalcemia  (please populate well all problems here in Problem List. (For example, if patient is on BP meds at home and you resume or decide to hold them, it is a problem that needs to be her. Same for CAD, COPD, HLD and so on)  Intractable nausea with vomiting -Clinically  suspect flareup of alcoholic gastritis.  Patient was advised to gradually wean off alcohol.  Patient expressed that under quite some stress in her life and she agreed to cut down her drink after she goes home. -CT abdominal pelvis reassuring -Symptomatic management, increase her  PPI, as needed Zofran  Severe hypokalemia -Secondary to repeated GI loss, IV and p.o. replacement -Check BMP this afternoon  Severe hypomagnesemia -Secondary to alcohol abuse and GI loss -IV replacement, recheck level this afternoon  Hypocalcemia -IV replacement, recheck level this afternoon  Pernicious anemia -Worsening of hemoglobin level compared to her baseline -Denies any black tarry stool or coffee-ground vomiting.  Will check iron level and reticulocyte count -She is getting B12 injection with, MCV still elevated, will check folate level  HTN -Stable, continue propranolol  Chronic peripheral neuropathy -Related to chronic B12 deficiency?  She is already on B12 injection with PCP -Continue gabapentin  Alcohol abuse -No symptoms or signs of acute withdrawal -Start CIWA protocol with as needed benzos  DVT prophylaxis: Lovenox Code Status: Full code Family Communication: None at bedside Disposition Plan: Expect less than 2 midnight hospital stay Consults called: None Admission status: Telemetry observation   Emeline General MD [facility] Pager [phone]  11/28/2022, 12:34 PM

## 2022-11-29 DIAGNOSIS — B9620 Unspecified Escherichia coli [E. coli] as the cause of diseases classified elsewhere: Secondary | ICD-10-CM | POA: Diagnosis present

## 2022-11-29 DIAGNOSIS — G629 Polyneuropathy, unspecified: Secondary | ICD-10-CM | POA: Diagnosis present

## 2022-11-29 DIAGNOSIS — E876 Hypokalemia: Secondary | ICD-10-CM

## 2022-11-29 DIAGNOSIS — E440 Moderate protein-calorie malnutrition: Secondary | ICD-10-CM | POA: Diagnosis present

## 2022-11-29 DIAGNOSIS — F10939 Alcohol use, unspecified with withdrawal, unspecified: Secondary | ICD-10-CM | POA: Diagnosis present

## 2022-11-29 DIAGNOSIS — R4182 Altered mental status, unspecified: Secondary | ICD-10-CM | POA: Diagnosis not present

## 2022-11-29 DIAGNOSIS — R110 Nausea: Secondary | ICD-10-CM | POA: Diagnosis present

## 2022-11-29 DIAGNOSIS — R918 Other nonspecific abnormal finding of lung field: Secondary | ICD-10-CM | POA: Diagnosis not present

## 2022-11-29 DIAGNOSIS — Z1152 Encounter for screening for COVID-19: Secondary | ICD-10-CM | POA: Diagnosis not present

## 2022-11-29 DIAGNOSIS — D510 Vitamin B12 deficiency anemia due to intrinsic factor deficiency: Secondary | ICD-10-CM | POA: Diagnosis present

## 2022-11-29 DIAGNOSIS — J440 Chronic obstructive pulmonary disease with acute lower respiratory infection: Secondary | ICD-10-CM | POA: Diagnosis present

## 2022-11-29 DIAGNOSIS — I10 Essential (primary) hypertension: Secondary | ICD-10-CM | POA: Diagnosis present

## 2022-11-29 DIAGNOSIS — E785 Hyperlipidemia, unspecified: Secondary | ICD-10-CM | POA: Diagnosis present

## 2022-11-29 DIAGNOSIS — Z9049 Acquired absence of other specified parts of digestive tract: Secondary | ICD-10-CM | POA: Diagnosis not present

## 2022-11-29 DIAGNOSIS — N390 Urinary tract infection, site not specified: Secondary | ICD-10-CM | POA: Diagnosis present

## 2022-11-29 DIAGNOSIS — E871 Hypo-osmolality and hyponatremia: Secondary | ICD-10-CM | POA: Diagnosis not present

## 2022-11-29 DIAGNOSIS — I2510 Atherosclerotic heart disease of native coronary artery without angina pectoris: Secondary | ICD-10-CM | POA: Diagnosis present

## 2022-11-29 DIAGNOSIS — R059 Cough, unspecified: Secondary | ICD-10-CM | POA: Diagnosis not present

## 2022-11-29 DIAGNOSIS — K760 Fatty (change of) liver, not elsewhere classified: Secondary | ICD-10-CM | POA: Diagnosis present

## 2022-11-29 DIAGNOSIS — I509 Heart failure, unspecified: Secondary | ICD-10-CM | POA: Diagnosis not present

## 2022-11-29 DIAGNOSIS — F10239 Alcohol dependence with withdrawal, unspecified: Secondary | ICD-10-CM | POA: Diagnosis present

## 2022-11-29 DIAGNOSIS — I700 Atherosclerosis of aorta: Secondary | ICD-10-CM | POA: Diagnosis present

## 2022-11-29 DIAGNOSIS — K2920 Alcoholic gastritis without bleeding: Secondary | ICD-10-CM | POA: Diagnosis present

## 2022-11-29 DIAGNOSIS — Z888 Allergy status to other drugs, medicaments and biological substances status: Secondary | ICD-10-CM | POA: Diagnosis not present

## 2022-11-29 DIAGNOSIS — J189 Pneumonia, unspecified organism: Secondary | ICD-10-CM | POA: Diagnosis present

## 2022-11-29 DIAGNOSIS — K219 Gastro-esophageal reflux disease without esophagitis: Secondary | ICD-10-CM | POA: Diagnosis present

## 2022-11-29 DIAGNOSIS — Z8042 Family history of malignant neoplasm of prostate: Secondary | ICD-10-CM | POA: Diagnosis not present

## 2022-11-29 HISTORY — DX: Alcohol use, unspecified with withdrawal, unspecified: F10.939

## 2022-11-29 LAB — COMPREHENSIVE METABOLIC PANEL
ALT: 26 U/L (ref 0–44)
AST: 130 U/L — ABNORMAL HIGH (ref 15–41)
Albumin: 2.1 g/dL — ABNORMAL LOW (ref 3.5–5.0)
Alkaline Phosphatase: 94 U/L (ref 38–126)
Anion gap: 12 (ref 5–15)
BUN: <5 mg/dL — ABNORMAL LOW (ref 6–20)
CO2: 18 mmol/L — ABNORMAL LOW (ref 22–32)
Calcium: 6.4 mg/dL — CL (ref 8.9–10.3)
Chloride: 99 mmol/L (ref 98–111)
Creatinine, Ser: 0.56 mg/dL (ref 0.44–1.00)
GFR, Estimated: >60 mL/min (ref >60)
Glucose, Bld: 182 mg/dL — ABNORMAL HIGH (ref 70–99)
Potassium: 3.4 mmol/L — ABNORMAL LOW (ref 3.5–5.1)
Sodium: 129 mmol/L — ABNORMAL LOW (ref 135–145)
Total Bilirubin: 1.7 mg/dL — ABNORMAL HIGH (ref 0.3–1.2)
Total Protein: 4.9 g/dL — ABNORMAL LOW (ref 6.5–8.1)

## 2022-11-29 LAB — MAGNESIUM: Magnesium: 2.2 mg/dL (ref 1.7–2.4)

## 2022-11-29 LAB — VITAMIN B12: Vitamin B-12: 1104 pg/mL — ABNORMAL HIGH (ref 180–914)

## 2022-11-29 LAB — CBC
HCT: 25.1 % — ABNORMAL LOW (ref 36.0–46.0)
Hemoglobin: 8.5 g/dL — ABNORMAL LOW (ref 12.0–15.0)
MCH: 35.1 pg — ABNORMAL HIGH (ref 26.0–34.0)
MCHC: 33.9 g/dL (ref 30.0–36.0)
MCV: 103.7 fL — ABNORMAL HIGH (ref 80.0–100.0)
Platelets: 134 10*3/uL — ABNORMAL LOW (ref 150–400)
RBC: 2.42 MIL/uL — ABNORMAL LOW (ref 3.87–5.11)
RDW: 14.6 % (ref 11.5–15.5)
WBC: 4.7 10*3/uL (ref 4.0–10.5)
nRBC: 0 % (ref 0.0–0.2)

## 2022-11-29 LAB — PHOSPHORUS: Phosphorus: 1.3 mg/dL — ABNORMAL LOW (ref 2.5–4.6)

## 2022-11-29 MED ORDER — DIAZEPAM 5 MG PO TABS
10.0000 mg | ORAL_TABLET | Freq: Three times a day (TID) | ORAL | Status: DC
  Administered 2022-11-29 – 2022-11-30 (×4): 10 mg via ORAL
  Filled 2022-11-29 (×4): qty 2

## 2022-11-29 MED ORDER — LACTATED RINGERS IV SOLN: INTRAVENOUS | Status: DC

## 2022-11-29 MED ORDER — CALCIUM GLUCONATE-NACL 2-0.675 GM/100ML-% IV SOLN
2.0000 g | Freq: Once | INTRAVENOUS | Status: AC
  Administered 2022-11-29: 2000 mg via INTRAVENOUS
  Filled 2022-11-29: qty 100

## 2022-11-29 MED ORDER — CALCIUM GLUCONATE-NACL 1-0.675 GM/50ML-% IV SOLN
1.0000 g | Freq: Once | INTRAVENOUS | Status: AC
  Administered 2022-11-29: 1000 mg via INTRAVENOUS
  Filled 2022-11-29: qty 50

## 2022-11-29 MED ORDER — VITAMIN B-12 1000 MCG PO TABS
1000.0000 ug | ORAL_TABLET | Freq: Every day | ORAL | Status: DC
  Administered 2022-11-30 – 2022-12-01 (×2): 1000 ug via ORAL
  Filled 2022-11-29 (×3): qty 1

## 2022-11-29 MED ORDER — SODIUM CHLORIDE 0.9 % IV SOLN: 12.5000 mg | Freq: Four times a day (QID) | INTRAVENOUS | Status: DC | PRN

## 2022-11-29 MED ORDER — PANTOPRAZOLE SODIUM 40 MG IV SOLR
40.0000 mg | Freq: Two times a day (BID) | INTRAVENOUS | Status: DC
  Administered 2022-11-29 – 2022-12-01 (×5): 40 mg via INTRAVENOUS
  Filled 2022-11-29 (×5): qty 10

## 2022-11-29 MED ORDER — SODIUM CHLORIDE 0.9 % IV SOLN: Freq: Once | INTRAVENOUS | Status: AC

## 2022-11-29 MED ORDER — POTASSIUM PHOSPHATES 15 MMOLE/5ML IV SOLN
30.0000 mmol | Freq: Once | INTRAVENOUS | Status: AC
  Administered 2022-11-29: 30 mmol via INTRAVENOUS
  Filled 2022-11-29: qty 10

## 2022-11-29 MED ORDER — CALCIUM CARBONATE 1250 (500 CA) MG PO TABS
1.0000 | ORAL_TABLET | Freq: Every day | ORAL | Status: DC
  Administered 2022-11-30 – 2022-12-07 (×8): 1250 mg via ORAL
  Filled 2022-11-29 (×8): qty 1

## 2022-11-29 MED ORDER — THIAMINE HCL 100 MG/ML IJ SOLN
500.0000 mg | Freq: Three times a day (TID) | INTRAVENOUS | Status: AC
  Administered 2022-11-29 – 2022-12-01 (×9): 500 mg via INTRAVENOUS
  Filled 2022-11-29 (×9): qty 5

## 2022-11-29 NOTE — Progress Notes (Signed)
PROGRESS NOTE    Kathy [name]  GUY:[id number] DOB: [birth year] DOA: 11/28/2022 PCP: Sherlene Shams, MD    Brief Narrative:   62 y.o. female with medical history significant of alcohol abuse with chronic alcoholic gastritis with pernicious anemia on chronic B12 injection, HTN, COPD, HLD, hiatal hernia, GERD, chronic peripheral neuropathy, presented with worsening of nauseous vomiting abdominal pain.   Symptoms started 3 to 4 days ago, patient developed cramping-like epigastric pain with frequent feeling of nauseous vomiting of stomach content, denies any diarrhea no fever or chills.  She could barely tolerate any food, and she threw up her pills as well but able to take only water.  She was told by her PCP on last visit " potassium and calcium levels are low:" And she was prescribed with calcium and potassium supplement however she could not take any of those supplement pills for last 3+ days.  This morning she became very weak and feeling dizzy and decided to come to ED.  10/6: Patient remains tachycardic but hemodynamically stable.  Mentating very poorly.  Confabulating.   Assessment & Plan:   Principal Problem:   Hypokalemia Active Problems:   Alcohol use disorder   Hypocalcemia  Acute alcohol withdrawal Intractable nausea and vomiting Alcoholic gastritis Patient presented with intractable nausea and vomiting however mental status has deteriorated since admission.  Initial abdominal imaging reassuring.  Patient CIWA scores are elevated.  Seems to be tolerating p.o. at this time. Plan: Continue CIWA protocol with as needed Ativan As scheduled diazepam 10 mg 3 times daily IV thiamine 500 mg 3 times daily IV fluids PPI Antiemetics Low threshold to transfer to higher level of care for initiation of Precedex should withdrawal symptoms become more severe  Hypokalemia Hypomagnesemia Hypocalcemia Hypophosphatemia Likely all nutritional deficiencies in the setting of  alcoholism.  Monitor and replace as necessary.  Pernicious anemia Continue B12 injections Daily folic acid  Hypertension Propranolol    DVT prophylaxis: SQ Lovenox Code Status: Full Family Communication: Daughter Jeanella Flattery [phone] on 10/6 Disposition Plan: Status is: Observation The patient will require care spanning > 2 midnights and should be moved to inpatient because: Acute alcohol withdrawal   Level of care: Telemetry Medical  Consultants:  None  Procedures:  None  Antimicrobials: None   Subjective: Seen and examined.  Resting in bed.  Confabulating.  Objective: Vitals:   11/28/22 1723 11/29/22 0100 11/29/22 0742 11/29/22 1100  BP: 112/77 105/78 (!) 117/90 105/72  Pulse: (!) 116 (!) 114 (!) 106 (!) 122  Resp:  17 14   Temp:  98.3 F (36.8 C) 98.3 F (36.8 C)   TempSrc:  Oral    SpO2:  98% 95%   Weight:      Height:        Intake/Output Summary (Last 24 hours) at 11/29/2022 1129 Last data filed at 11/28/2022 1900 Gross per 24 hour  Intake 1918.37 ml  Output --  Net 1918.37 ml   Filed Weights   11/28/22 0849  Weight: 55 kg    Examination:  General exam: Tremulous Respiratory system: Clear to auscultation. Respiratory effort normal. Cardiovascular system: Tachycardic, regular rhythm, no murmurs Gastrointestinal system: Abdomen is nondistended, soft and nontender. No organomegaly or masses felt. Normal bowel sounds heard. Central nervous system: Alert.  Oriented to person only.  Confused Extremities: Symmetric 5 x 5 power. Skin: No rashes, lesions or ulcers Psychiatry: Judgement and insight appear impaired. Mood & affect confused.     Data Reviewed: I have personally reviewed  following labs and imaging studies  CBC: Recent Labs  Lab 11/28/22 0856 11/29/22 0337  WBC 5.9 4.7  NEUTROABS 3.4  --   HGB 9.2* 8.5*  HCT 26.5* 25.1*  MCV 103.5* 103.7*  PLT 153 134*   Basic Metabolic Panel: Recent Labs  Lab 11/28/22 0856  11/28/22 1100 11/28/22 1729 11/28/22 1731 11/29/22 0337  NA 132*  --  131*  --  129*  K 2.4*  --  3.7  --  3.4*  CL 94*  --  98  --  99  CO2 22  --  21*  --  18*  GLUCOSE 136*  --  137*  --  182*  BUN <5*  --  <5*  --  <5*  CREATININE 0.53  --  0.45  --  0.56  CALCIUM 6.1*  --  5.8*  --  6.4*  MG 0.9*  --   --  1.5* 2.2  PHOS  --  3.1  --   --  1.3*   GFR: Estimated Creatinine Clearance: 59.1 mL/min (by C-G formula based on SCr of 0.56 mg/dL). Liver Function Tests: Recent Labs  Lab 11/28/22 0856 11/28/22 1729 11/29/22 0337  AST 126*  --  130*  ALT 25  --  26  ALKPHOS 94  --  94  BILITOT 1.5*  --  1.7*  PROT 5.8*  --  4.9*  ALBUMIN 2.4* 2.5* 2.1*   Recent Labs  Lab 11/28/22 0856  LIPASE 34   No results for input(s): "AMMONIA" in the last 168 hours. Coagulation Profile: No results for input(s): "INR", "PROTIME" in the last 168 hours. Cardiac Enzymes: No results for input(s): "CKTOTAL", "CKMB", "CKMBINDEX", "TROPONINI" in the last 168 hours. BNP (last 3 results) No results for input(s): "PROBNP" in the last 8760 hours. HbA1C: No results for input(s): "HGBA1C" in the last 72 hours. CBG: No results for input(s): "GLUCAP" in the last 168 hours. Lipid Profile: No results for input(s): "CHOL", "HDL", "LDLCALC", "TRIG", "CHOLHDL", "LDLDIRECT" in the last 72 hours. Thyroid Function Tests: No results for input(s): "TSH", "T4TOTAL", "FREET4", "T3FREE", "THYROIDAB" in the last 72 hours. Anemia Panel: Recent Labs    11/28/22 0856 11/28/22 1100 11/28/22 1729  VITAMINB12  --   --  1,104*  FOLATE  --  5.3*  --   FERRITIN  --  354*  --   TIBC  --  157*  --   IRON  --  145  --   RETICCTPCT 1.9  --   --    Sepsis Labs: No results for input(s): "PROCALCITON", "LATICACIDVEN" in the last 168 hours.  Recent Results (from the past 240 hour(s))  SARS Coronavirus 2 by RT PCR (hospital order, performed in [facility] hospital lab) *cepheid single result test* Anterior Nasal  Swab     Status: None   Collection Time: 11/28/22 12:32 PM   Specimen: Anterior Nasal Swab  Result Value Ref Range Status   SARS Coronavirus 2 by RT PCR NEGATIVE NEGATIVE Final    Comment: (NOTE) SARS-CoV-2 target nucleic acids are NOT DETECTED.  The SARS-CoV-2 RNA is generally detectable in upper and lower respiratory specimens during the acute phase of infection. The lowest concentration of SARS-CoV-2 viral copies this assay can detect is 250 copies / mL. A negative result does not preclude SARS-CoV-2 infection and should not be used as the sole basis for treatment or other patient management decisions.  A negative result may occur with improper specimen collection / handling, submission of specimen other  than nasopharyngeal swab, presence of viral mutation(s) within the areas targeted by this assay, and inadequate number of viral copies (<250 copies / mL). A negative result must be combined with clinical observations, patient history, and epidemiological information.  Fact Sheet for Patients:   RoadLapTop.co.za  Fact Sheet for Healthcare Providers: http://kim-miller.com/  This test is not yet approved or  cleared by the [place] FDA and has been authorized for detection and/or diagnosis of SARS-CoV-2 by FDA under an Emergency Use Authorization (EUA).  This EUA will remain in effect (meaning this test can be used) for the duration of the COVID-19 declaration under Section 564(b)(1) of the Act, 21 U.S.C. section 360bbb-3(b)(1), unless the authorization is terminated or revoked sooner.  Performed at [facility], [address] [place], [place] [address]          Radiology Studies: CT ABDOMEN PELVIS W CONTRAST  Result Date: 11/28/2022 CLINICAL DATA:  Acute abdominal pain. Nausea and vomiting for 2 days. Hiatal hernia. EXAM: CT ABDOMEN AND PELVIS WITH CONTRAST TECHNIQUE: Multidetector CT imaging of the abdomen  and pelvis was performed using the standard protocol following bolus administration of intravenous contrast. RADIATION DOSE REDUCTION: This exam was performed according to the departmental dose-optimization program which includes automated exposure control, adjustment of the mA and/or kV according to patient size and/or use of iterative reconstruction technique. CONTRAST:  75mL OMNIPAQUE IOHEXOL 300 MG/ML  SOLN COMPARISON:  04/10/2022 FINDINGS: Lower Chest: No acute findings. Hepatobiliary: No suspicious hepatic masses identified. Hepatomegaly and severe diffuse hepatic steatosis is increased since previous study. Focal fatty sparing is seen in the left hepatic lobe. Prior cholecystectomy again noted. Extrahepatic biliary ductal dilatation remains stable. Pancreas:  No mass or inflammatory changes. Spleen: Within normal limits in size and appearance. Adrenals/Urinary Tract: No suspicious masses identified. Chronic severe left renal atrophy with dilated renal collecting system remains unchanged. No evidence of ureteral calculi or dilatation. Unremarkable unopacified urinary bladder. Stomach/Bowel: Large hiatal hernia is again seen but incompletely visualized on this exam. No evidence of obstruction, inflammatory process or abnormal fluid collections. Normal appendix visualized. Diverticulosis is seen mainly involving the descending and sigmoid colon, however there is no evidence of diverticulitis. Vascular/Lymphatic: No pathologically enlarged lymph nodes. No acute vascular findings. Reproductive:  No mass or other significant abnormality. Other:  None. Musculoskeletal:  No suspicious bone lesions identified. IMPRESSION: Increased hepatomegaly and severe diffuse hepatic steatosis. Large hiatal hernia. Stable chronic severe left renal atrophy. Colonic diverticulosis, without radiographic evidence of diverticulitis. Electronically Signed   By: Danae Orleans M.D.   On: 11/28/2022 11:14   DG Chest Portable 1  View  Result Date: 11/28/2022 CLINICAL DATA:  Shortness of breath and cough EXAM: PORTABLE CHEST 1 VIEW COMPARISON:  X-ray 12/12/2021 FINDINGS: Eventration of the right hemidiaphragm. No consolidation, pneumothorax or effusion. Normal cardiopericardial silhouette with a calcified aorta. No edema. Overlapping cardiac leads. Old bilateral rib fractures are seen. Moderate hiatal hernia. IMPRESSION: No acute cardiopulmonary disease. Hiatal hernia. Old rib fractures. Electronically Signed   By: Karen Kays M.D.   On: 11/28/2022 10:12        Scheduled Meds:  [START ON 11/30/2022] calcium carbonate  1 tablet Oral Q breakfast   diazepam  10 mg Oral TID   enoxaparin (LOVENOX) injection  40 mg Subcutaneous Q24H   folic acid  1 mg Oral Daily   gabapentin  800 mg Oral TID   multivitamin with minerals  1 tablet Oral Daily   pantoprazole (PROTONIX) IV  40  mg Intravenous Q12H   Continuous Infusions:  lactated ringers 100 mL/hr at 11/29/22 0957   potassium PHOSPHATE IVPB (in mmol) 30 mmol (11/29/22 0636)   promethazine (PHENERGAN) injection (IM or IVPB)     thiamine (VITAMIN B1) injection 500 mg (11/29/22 1001)     LOS: 0 days     Tresa Moore, MD [facility]   If 7PM-7AM, please contact night-coverage  11/29/2022, 11:29 AM

## 2022-11-29 NOTE — Plan of Care (Signed)
  Problem: Education: Goal: Knowledge of General Education information will improve Description Including pain rating scale, medication(s)/side effects and non-pharmacologic comfort measures Outcome: Progressing   Problem: Nutrition: Goal: Adequate nutrition will be maintained Outcome: Progressing   Problem: Pain Managment: Goal: General experience of comfort will improve Outcome: Progressing

## 2022-11-29 NOTE — Plan of Care (Signed)
  Problem: Education: Goal: Knowledge of General Education information will improve Description: Including pain rating scale, medication(s)/side effects and non-pharmacologic comfort measures Outcome: Progressing   Problem: Clinical Measurements: Goal: Ability to maintain clinical measurements within normal limits will improve Outcome: Progressing Goal: Will remain free from infection Outcome: Progressing   

## 2022-11-30 DIAGNOSIS — E876 Hypokalemia: Secondary | ICD-10-CM | POA: Diagnosis not present

## 2022-11-30 LAB — CBC WITH DIFFERENTIAL/PLATELET
Abs Immature Granulocytes: 0.03 10*3/uL (ref 0.00–0.07)
Basophils Absolute: 0 10*3/uL (ref 0.0–0.1)
Basophils Relative: 1 %
Eosinophils Absolute: 0.1 10*3/uL (ref 0.0–0.5)
Eosinophils Relative: 2 %
HCT: 28.2 % — ABNORMAL LOW (ref 36.0–46.0)
Hemoglobin: 9.5 g/dL — ABNORMAL LOW (ref 12.0–15.0)
Immature Granulocytes: 1 %
Lymphocytes Relative: 25 %
Lymphs Abs: 1.5 10*3/uL (ref 0.7–4.0)
MCH: 34.7 pg — ABNORMAL HIGH (ref 26.0–34.0)
MCHC: 33.7 g/dL (ref 30.0–36.0)
MCV: 102.9 fL — ABNORMAL HIGH (ref 80.0–100.0)
Monocytes Absolute: 0.5 10*3/uL (ref 0.1–1.0)
Monocytes Relative: 8 %
Neutro Abs: 3.9 10*3/uL (ref 1.7–7.7)
Neutrophils Relative %: 63 %
Platelets: 147 10*3/uL — ABNORMAL LOW (ref 150–400)
RBC: 2.74 MIL/uL — ABNORMAL LOW (ref 3.87–5.11)
RDW: 14.9 % (ref 11.5–15.5)
WBC: 6.1 10*3/uL (ref 4.0–10.5)
nRBC: 0 % (ref 0.0–0.2)

## 2022-11-30 LAB — MAGNESIUM: Magnesium: 1.4 mg/dL — ABNORMAL LOW (ref 1.7–2.4)

## 2022-11-30 LAB — BASIC METABOLIC PANEL
Anion gap: 12 (ref 5–15)
BUN: <5 mg/dL — ABNORMAL LOW (ref 6–20)
CO2: 24 mmol/L (ref 22–32)
Calcium: 7.6 mg/dL — ABNORMAL LOW (ref 8.9–10.3)
Chloride: 99 mmol/L (ref 98–111)
Creatinine, Ser: 0.43 mg/dL — ABNORMAL LOW (ref 0.44–1.00)
GFR, Estimated: >60 mL/min (ref >60)
Glucose, Bld: 108 mg/dL — ABNORMAL HIGH (ref 70–99)
Potassium: 3.8 mmol/L (ref 3.5–5.1)
Sodium: 135 mmol/L (ref 135–145)

## 2022-11-30 LAB — URINE CULTURE: Culture: >=100000 — AB

## 2022-11-30 MED ORDER — DIAZEPAM 5 MG PO TABS
5.0000 mg | ORAL_TABLET | Freq: Three times a day (TID) | ORAL | Status: DC
  Administered 2022-11-30 – 2022-12-01 (×2): 5 mg via ORAL
  Filled 2022-11-30 (×2): qty 1

## 2022-11-30 MED ORDER — ENSURE ENLIVE PO LIQD
237.0000 mL | Freq: Three times a day (TID) | ORAL | Status: DC
  Administered 2022-11-30 – 2022-12-07 (×12): 237 mL via ORAL

## 2022-11-30 MED ORDER — FOLIC ACID 5 MG/ML IJ SOLN
1.0000 mg | Freq: Every day | INTRAMUSCULAR | Status: DC
  Administered 2022-12-01 – 2022-12-03 (×3): 1 mg via INTRAVENOUS
  Filled 2022-11-30 (×3): qty 0.2

## 2022-11-30 MED ORDER — SODIUM CHLORIDE 0.9 % IV SOLN
1.0000 g | INTRAVENOUS | Status: AC
  Administered 2022-11-30 – 2022-12-01 (×2): 1 g via INTRAVENOUS
  Filled 2022-11-30 (×2): qty 10

## 2022-11-30 MED ORDER — MAGNESIUM SULFATE 4 GM/100ML IV SOLN
4.0000 g | Freq: Once | INTRAVENOUS | Status: AC
  Administered 2022-11-30: 4 g via INTRAVENOUS
  Filled 2022-11-30: qty 100

## 2022-11-30 NOTE — Progress Notes (Signed)
Initial Nutrition Assessment  DOCUMENTATION CODES:   Non-severe (moderate) malnutrition in context of chronic illness  INTERVENTION:   Ensure Enlive po TID, each supplement provides 350 kcal and 20 grams of protein.  Magic cup TID with meals, each supplement provides 290 kcal and 9 grams of protein  MVI, folic acid and thiamine daily   Pt at high refeed risk; recommend monitor potassium, magnesium and phosphorus labs daily until stable  Daily weights   NUTRITION DIAGNOSIS:   Moderate Malnutrition related to social / environmental circumstances as evidenced by moderate fat depletion, moderate muscle depletion.  GOAL:   Patient will meet greater than or equal to 90% of their needs  MONITOR:   PO intake, Supplement acceptance, Labs, Weight trends, I & O's, Skin  REASON FOR ASSESSMENT:   Consult Assessment of nutrition requirement/status  ASSESSMENT:   62 y/o female with h/o etoh abuse, MDD, anxiety, paraesophageal hernia, COPD, fatty liver, HLD, B12 deficiency, chronic peripheral neuropathy and GERD who is admitted with acute alcohol withdrawal, intractable nausea/vomiting and alcoholic gastritis.  Met with pt in room today. Pt lethargic and unable to provide any history so history provided by pt's daughters at bedside. Family reports pt with poor appetite and and oral intake at baseline. Family reports that pt mainly drinks and eats small meals. Family reports that pt was able to get sober for ~ 5 years and reports that pt drank Ensure during that time and was doing better with her nutrition but reports that she started back drinking and now her appetite is poor again. Family also reports that pt has been struggling with keeping food down at times secondary to a hiatal hernia. Pt also has bad GERD. Family reports that pt is not eating much in hospital; pt eating and drinking sips and bites. Of note, pt wears full dentures. RD will add supplements to help pt meet her estimated  needs. RD discussed with family the importance of adequate nutrition needed to preserve lean muscle. Family will encourage good nutrition. If patient continues to remain lethargic and unable to eat, may need to consider NGT placement and nutrition support; this was discussed with family. Pt is at high refeed risk. Electrolytes being monitored and replaced.   Family reports that pt has always been small but reports that at one point she was down to 95lbs. Family reports they believe pt has gained some weight. Per chart, pt appears to be up ~15lbs since July.   Of note, pt with folic acid deficiency; pt is receiving supplementation. Pt receiving high dose thiamine.   Medications reviewed and include: oscal, B12, lovenox, folic acid, MVI, thiamine, protonix, ceftriaxone   Labs reviewed: K 3.8 wnl, BUN <5(L), creat 0.43(L), Mg 1.4(L) P 1.3(L) Folate 5.3(L), B12 1104(H)- 10/5 Hgb 9.5(L), Hct 28.2(L), MCV 102.9(H), MCH 34.7(H)  NUTRITION - FOCUSED PHYSICAL EXAM:  Flowsheet Row Most Recent Value  Orbital Region No depletion  Upper Arm Region Moderate depletion  Thoracic and Lumbar Region Moderate depletion  Buccal Region No depletion  Temple Region Mild depletion  Clavicle Bone Region Mild depletion  Clavicle and Acromion Bone Region Mild depletion  Scapular Bone Region No depletion  Dorsal Hand Mild depletion  Patellar Region Moderate depletion  Anterior Thigh Region Moderate depletion  Posterior Calf Region Moderate depletion  Edema (RD Assessment) None  Hair Reviewed  Eyes Reviewed  Mouth Reviewed  Skin Reviewed  Nails Reviewed   Diet Order:   Diet Order  Diet regular Fluid consistency: Thin  Diet effective now                  EDUCATION NEEDS:   Not appropriate for education at this time  Skin:  Skin Assessment: Reviewed RN Assessment  Last BM:  10/7- type 6  Height:   Ht Readings from Last 1 Encounters:  11/28/22 5\' 2"  (1.575 m)    Weight:   Wt  Readings from Last 1 Encounters:  11/30/22 52 kg    Ideal Body Weight:  50 kg  BMI:  Body mass index is 20.98 kg/m.  Estimated Nutritional Needs:   Kcal:  1400-1600kcal/day  Protein:  70-80g/day  Fluid:  1.6-1.8L/day  Betsey Holiday MS, RD, LDN Please refer to [facility] for RD and/or RD on-call/weekend/after hours pager

## 2022-11-30 NOTE — Plan of Care (Signed)
  Problem: Education: Goal: Knowledge of General Education information will improve Description: Including pain rating scale, medication(s)/side effects and non-pharmacologic comfort measures Outcome: Progressing   Problem: Activity: Goal: Risk for activity intolerance will decrease Outcome: Progressing   Problem: Nutrition: Goal: Adequate nutrition will be maintained Outcome: Progressing   

## 2022-11-30 NOTE — Plan of Care (Signed)

## 2022-11-30 NOTE — Progress Notes (Signed)
PROGRESS NOTE    Kathy [name]  QIO:[id number] DOB: 12/12/1960 DOA: 11/28/2022 PCP: Sherlene Shams, MD    Brief Narrative:   62 y.o. female with medical history significant of alcohol abuse with chronic alcoholic gastritis with pernicious anemia on chronic B12 injection, HTN, COPD, HLD, hiatal hernia, GERD, chronic peripheral neuropathy, presented with worsening of nauseous vomiting abdominal pain.   Symptoms started 3 to 4 days ago, patient developed cramping-like epigastric pain with frequent feeling of nauseous vomiting of stomach content, denies any diarrhea no fever or chills.  She could barely tolerate any food, and she threw up her pills as well but able to take only water.  She was told by her PCP on last visit " potassium and calcium levels are low:" And she was prescribed with calcium and potassium supplement however she could not take any of those supplement pills for last 3+ days.  This morning she became very weak and feeling dizzy and decided to come to ED.  10/6: Patient remains tachycardic but hemodynamically stable.  Mentating very poorly.  Confabulating.   Assessment & Plan:   Principal Problem:   Hypokalemia Active Problems:   Alcohol use disorder   Hypocalcemia   Alcohol withdrawal (HCC)  Acute alcohol withdrawal Intractable nausea and vomiting Alcoholic gastritis Patient presented with intractable nausea and vomiting however mental status has deteriorated since admission.  Initial abdominal imaging reassuring.  Patient CIWA scores are elevated.  Seems to be tolerating p.o. at this time. Plan: Continue CIWA protocol with as needed Ativan Decrease diazepam to 5 mg 3 times daily IV thiamine 500 mg 3 times daily x 9 doses Continue IV fluids PPI Antiemetics   Hypokalemia Hypomagnesemia Hypocalcemia Hypophosphatemia Likely all nutritional deficiencies in the setting of alcoholism.  Monitor and replace as necessary.  Majority of electrolytes have  corrected  Pernicious anemia Continue B12 tablets Daily folic acid  Hypertension Propranolol    DVT prophylaxis: SQ Lovenox Code Status: Full Family Communication: Daughter Jeanella Flattery [phone] on 10/6, at bedside 10/7 Disposition Plan: Status is: Inpatient Remains inpatient appropriate because: Acute alcohol withdrawal     Level of care: Telemetry Medical  Consultants:  None  Procedures:  None  Antimicrobials: None   Subjective: Seen and salmon.  Daughter at bedside.  Patient resting in bed.  Confabulating.  Unable to provide history.  Objective: Vitals:   11/29/22 1536 11/30/22 0233 11/30/22 0450 11/30/22 0738  BP: 99/60 (!) 110/98 110/79 112/75  Pulse: (!) 105 81 (!) 125 83  Resp: 14 18 18 18   Temp: 98.5 F (36.9 C) 98.7 F (37.1 C) 98.1 F (36.7 C)   TempSrc:  Axillary    SpO2: 100%  95% 100%  Weight:      Height:        Intake/Output Summary (Last 24 hours) at 11/30/2022 1124 Last data filed at 11/30/2022 5366 Gross per 24 hour  Intake 843.7 ml  Output 900 ml  Net -56.3 ml   Filed Weights   11/28/22 0849  Weight: 55 kg    Examination:  General exam: Appears fatigued Respiratory system: Lungs clear.  No work of breathing.  Room air Cardiovascular system: Tachycardic, regular rhythm, no murmurs Gastrointestinal system: Abdomen is nondistended, soft and nontender. No organomegaly or masses felt. Normal bowel sounds heard. Central nervous system: Alert.  Oriented to person only.  Confused Extremities: Symmetric 5 x 5 power. Skin: No rashes, lesions or ulcers Psychiatry: Judgement and insight appear impaired. Mood & affect confused.  Data Reviewed: I have personally reviewed following labs and imaging studies  CBC: Recent Labs  Lab 11/28/22 0856 11/29/22 0337 11/30/22 0823  WBC 5.9 4.7 6.1  NEUTROABS 3.4  --  3.9  HGB 9.2* 8.5* 9.5*  HCT 26.5* 25.1* 28.2*  MCV 103.5* 103.7* 102.9*  PLT 153 134* 147*   [name] Metabolic  Panel: Recent Labs  Lab 11/28/22 0856 11/28/22 1100 11/28/22 1729 11/28/22 1731 11/29/22 0337 11/30/22 0823  NA 132*  --  131*  --  129* 135  K 2.4*  --  3.7  --  3.4* 3.8  CL 94*  --  98  --  99 99  CO2 22  --  21*  --  18* 24  GLUCOSE 136*  --  137*  --  182* 108*  BUN <5*  --  <5*  --  <5* <5*  CREATININE 0.53  --  0.45  --  0.56 0.43*  CALCIUM 6.1*  --  5.8*  --  6.4* 7.6*  MG 0.9*  --   --  1.5* 2.2 1.4*  PHOS  --  3.1  --   --  1.3*  --    GFR: Estimated Creatinine Clearance: 59.1 mL/min (A) (by C-G formula based on SCr of 0.43 mg/dL (L)). Liver Function Tests: Recent Labs  Lab 11/28/22 0856 11/28/22 1729 11/29/22 0337  AST 126*  --  130*  ALT 25  --  26  ALKPHOS 94  --  94  BILITOT 1.5*  --  1.7*  PROT 5.8*  --  4.9*  ALBUMIN 2.4* 2.5* 2.1*   Recent Labs  Lab 11/28/22 0856  LIPASE 34   No results for input(s): "AMMONIA" in the last 168 hours. Coagulation Profile: No results for input(s): "INR", "PROTIME" in the last 168 hours. Cardiac Enzymes: No results for input(s): "CKTOTAL", "CKMB", "CKMBINDEX", "TROPONINI" in the last 168 hours. BNP (last 3 results) No results for input(s): "PROBNP" in the last 8760 hours. HbA1C: No results for input(s): "HGBA1C" in the last 72 hours. CBG: No results for input(s): "GLUCAP" in the last 168 hours. Lipid Profile: No results for input(s): "CHOL", "HDL", "LDLCALC", "TRIG", "CHOLHDL", "LDLDIRECT" in the last 72 hours. Thyroid Function Tests: No results for input(s): "TSH", "T4TOTAL", "FREET4", "T3FREE", "THYROIDAB" in the last 72 hours. Anemia Panel: Recent Labs    11/28/22 0856 11/28/22 1100 11/28/22 1729  VITAMINB12  --   --  1,104*  FOLATE  --  5.3*  --   FERRITIN  --  354*  --   TIBC  --  157*  --   IRON  --  145  --   RETICCTPCT 1.9  --   --    Sepsis Labs: No results for input(s): "PROCALCITON", "LATICACIDVEN" in the last 168 hours.  Recent Results (from the past 240 hour(s))  Urine Culture      Status: Abnormal   Collection Time: 11/28/22 10:51 AM   Specimen: Urine, Clean Catch  Result Value Ref Range Status   Specimen Description   Final    URINE, CLEAN CATCH Performed at [facility], [address], [place], [place] [address]    Special Requests   Final    NONE Performed at [facility], [address] [place], [place] [address]    Culture >=100,000 COLONIES/mL ESCHERICHIA COLI (A)  Final   Report Status 11/30/2022 FINAL  Final   Organism ID, Bacteria ESCHERICHIA COLI (A)  Final      Susceptibility   Escherichia  coli - MIC*    AMPICILLIN <=2 SENSITIVE Sensitive     CEFAZOLIN <=4 SENSITIVE Sensitive     CEFEPIME <=0.12 SENSITIVE Sensitive     CEFTRIAXONE <=0.25 SENSITIVE Sensitive     CIPROFLOXACIN <=0.25 SENSITIVE Sensitive     GENTAMICIN <=1 SENSITIVE Sensitive     IMIPENEM <=0.25 SENSITIVE Sensitive     NITROFURANTOIN <=16 SENSITIVE Sensitive     TRIMETH/SULFA <=20 SENSITIVE Sensitive     AMPICILLIN/SULBACTAM <=2 SENSITIVE Sensitive     PIP/TAZO <=4 SENSITIVE Sensitive     * >=100,000 COLONIES/mL ESCHERICHIA COLI  SARS Coronavirus 2 by RT PCR (hospital order, performed in [facility] hospital lab) *cepheid single result test* Anterior Nasal Swab     Status: None   Collection Time: 11/28/22 12:32 PM   Specimen: Anterior Nasal Swab  Result Value Ref Range Status   SARS Coronavirus 2 by RT PCR NEGATIVE NEGATIVE Final    Comment: (NOTE) SARS-CoV-2 target nucleic acids are NOT DETECTED.  The SARS-CoV-2 RNA is generally detectable in upper and lower respiratory specimens during the acute phase of infection. The lowest concentration of SARS-CoV-2 viral copies this assay can detect is 250 copies / mL. A negative result does not preclude SARS-CoV-2 infection and should not be used as the sole basis for treatment or other patient management decisions.  A negative result may occur with improper specimen collection / handling, submission of specimen  other than nasopharyngeal swab, presence of viral mutation(s) within the areas targeted by this assay, and inadequate number of viral copies (<250 copies / mL). A negative result must be combined with clinical observations, patient history, and epidemiological information.  Fact Sheet for Patients:   RoadLapTop.co.za  Fact Sheet for Healthcare Providers: http://kim-miller.com/  This test is not yet approved or  cleared by the [place] FDA and has been authorized for detection and/or diagnosis of SARS-CoV-2 by FDA under an Emergency Use Authorization (EUA).  This EUA will remain in effect (meaning this test can be used) for the duration of the COVID-19 declaration under Section 564(b)(1) of the Act, 21 U.S.C. section 360bbb-3(b)(1), unless the authorization is terminated or revoked sooner.  Performed at [facility], [address] [place], [place] [address]          Radiology Studies: No results found.      Scheduled Meds:  calcium carbonate  1 tablet Oral Q breakfast   vitamin B-12  1,000 mcg Oral Daily   diazepam  5 mg Oral TID   enoxaparin (LOVENOX) injection  40 mg Subcutaneous Q24H   folic acid  1 mg Oral Daily   gabapentin  800 mg Oral TID   multivitamin with minerals  1 tablet Oral Daily   pantoprazole (PROTONIX) IV  40 mg Intravenous Q12H   Continuous Infusions:  cefTRIAXone (ROCEPHIN)  IV     lactated ringers 100 mL/hr at 11/29/22 2345   magnesium sulfate bolus IVPB 4 g (11/30/22 1026)   promethazine (PHENERGAN) injection (IM or IVPB)     thiamine (VITAMIN B1) injection 500 mg (11/30/22 0529)     LOS: 1 day     Tresa Moore, MD [facility]   If 7PM-7AM, please contact night-coverage  11/30/2022, 11:24 AM

## 2022-12-01 ENCOUNTER — Inpatient Hospital Stay: Payer: Medicare HMO

## 2022-12-01 DIAGNOSIS — E440 Moderate protein-calorie malnutrition: Secondary | ICD-10-CM | POA: Insufficient documentation

## 2022-12-01 DIAGNOSIS — E876 Hypokalemia: Secondary | ICD-10-CM | POA: Diagnosis not present

## 2022-12-01 LAB — BASIC METABOLIC PANEL
Anion gap: 6 (ref 5–15)
BUN: <5 mg/dL — ABNORMAL LOW (ref 6–20)
CO2: 25 mmol/L (ref 22–32)
Calcium: 7.2 mg/dL — ABNORMAL LOW (ref 8.9–10.3)
Chloride: 103 mmol/L (ref 98–111)
Creatinine, Ser: 0.43 mg/dL — ABNORMAL LOW (ref 0.44–1.00)
GFR, Estimated: >60 mL/min (ref >60)
Glucose, Bld: 107 mg/dL — ABNORMAL HIGH (ref 70–99)
Potassium: 4.7 mmol/L (ref 3.5–5.1)
Sodium: 134 mmol/L — ABNORMAL LOW (ref 135–145)

## 2022-12-01 LAB — PHOSPHORUS: Phosphorus: 3.8 mg/dL (ref 2.5–4.6)

## 2022-12-01 LAB — MAGNESIUM: Magnesium: 2 mg/dL (ref 1.7–2.4)

## 2022-12-01 MED ORDER — PANTOPRAZOLE SODIUM 40 MG IV SOLR
40.0000 mg | INTRAVENOUS | Status: DC
  Administered 2022-12-02 – 2022-12-03 (×2): 40 mg via INTRAVENOUS
  Filled 2022-12-01 (×2): qty 10

## 2022-12-01 MED ORDER — SODIUM CHLORIDE 0.9 % IV BOLUS
1000.0000 mL | Freq: Once | INTRAVENOUS | Status: AC
  Administered 2022-12-01: 1000 mL via INTRAVENOUS

## 2022-12-01 MED ORDER — LOPERAMIDE HCL 2 MG PO CAPS
4.0000 mg | ORAL_CAPSULE | ORAL | Status: DC | PRN
  Administered 2022-12-04 (×2): 4 mg via ORAL
  Filled 2022-12-01 (×2): qty 2
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
## 2022-12-01 NOTE — Evaluation (Signed)
Physical Therapy Evaluation Patient Details Name: Kathy [name] MRN: [id number] DOB: 1960/03/15 Today's Date: 12/01/2022  History of Present Illness  Pt is a 62 y.o. female presenting to hospital 11/28/22 with c/o nausea (some vomiting and dry heaves).  Pt admitted with intractable nausea with vomiting, severe hypokalemia, severe hypomagnesemia, hypocalcemia, pernicious anemia.  PMH includes htn, HLD, hiatal hernia, alcohol abuse, chronic peripheral neuropathy, chronic alcoholic gastritis with pernicious anemia.  Clinical Impression  Prior to recent medical concerns, pt was independent with ambulation; lives with a roommate in level entry apt.  Currently pt is SBA to min assist with bed mobility; min assist with transfers; and min assist to ambulate 30 feet with RW use.  Pt appearing shaky in general during session and demonstrated 2 loss of balance to R (during ambulation) requiring assist to correct.  Pt demonstrating generalized weakness and generalized confusion during session.  Pt would currently benefit from skilled PT to address noted impairments and functional limitations (see below for any additional details).  Upon hospital discharge, pt would benefit from ongoing therapy.     If plan is discharge home, recommend the following: A little help with walking and/or transfers;A little help with bathing/dressing/bathroom;Assistance with cooking/housework;Assist for transportation;Help with stairs or ramp for entrance   Can travel by private vehicle   No    Equipment Recommendations Rolling walker (2 wheels)  Recommendations for Other Services       Functional Status Assessment Patient has had a recent decline in their functional status and demonstrates the ability to make significant improvements in function in a reasonable and predictable amount of time.     Precautions / Restrictions Precautions Precautions: Fall Restrictions Weight Bearing Restrictions: No      Mobility   Bed Mobility   Bed Mobility: Supine to Sit, Sit to Supine     Supine to sit: Min assist, HOB elevated (assist for trunk) Sit to supine: Supervision, HOB elevated        Transfers Overall transfer level: Needs assistance Equipment used: None, Rolling walker (2 wheels) Transfers: Sit to/from Stand Sit to Stand: Min assist           General transfer comment: x1 trial standing no AD use and x1 trial standing with RW use; pt shaky in standing requiring assist to steady    Ambulation/Gait Ambulation/Gait assistance: Min assist Gait Distance (Feet): 30 Feet Assistive device: Rolling walker (2 wheels) Gait Pattern/deviations: Step-through pattern, Decreased step length - right, Decreased step length - left Gait velocity: decreased     General Gait Details: assist to steady; 2 loss of balance to R requiring assist to correct  Stairs            Wheelchair Mobility     Tilt Bed    Modified Rankin (Stroke Patients Only)       Balance Overall balance assessment: Needs assistance Sitting-balance support: No upper extremity supported, Feet supported Sitting balance-Leahy Scale: Fair Sitting balance - Comments: pt appearing shaky in general in sitting   Standing balance support: Bilateral upper extremity supported, Reliant on assistive device for balance Standing balance-Leahy Scale: Fair Standing balance comment: steady static standing with RW use (pt unsteady without UE support)                             Pertinent Vitals/Pain Pain Assessment Pain Assessment: Faces Faces Pain Scale: Hurts a little bit Pain Location: L foot when standing (d/t neuropathy) Pain Descriptors /  Indicators: Discomfort Pain Intervention(s): Limited activity within patient's tolerance, Monitored during session, Repositioned HR about 98 bpm at rest beginning/end of session but increased to 133 bpm with activity; SpO2 sats WFL on room air during session.    Home Living    Living Arrangements: Non-relatives/Friends (Roommate) Available Help at Discharge: Available PRN/intermittently (Roommate) Type of Home: Apartment Home Access: Level entry       Home Layout: One level        Prior Function               Mobility Comments: Independent with ambulation.  Roommate drives.  Limited community mobility.       Extremity/Trunk Assessment   Upper Extremity Assessment Upper Extremity Assessment: Defer to OT evaluation;Generalized weakness    Lower Extremity Assessment Lower Extremity Assessment: Generalized weakness    Cervical / Trunk Assessment Cervical / Trunk Assessment: Normal  Communication   Communication Communication: Difficulty communicating thoughts/reduced clarity of speech Cueing Techniques: Verbal cues;Visual cues  Cognition Arousal: Alert Behavior During Therapy: WFL for tasks assessed/performed Overall Cognitive Status: Within Functional Limits for tasks assessed                                 General Comments: Oriented to person, place, general situation; increased time to verbalize date        General Comments  Nursing cleared pt for participation in physical therapy.  Pt agreeable to PT session.  Pt's daughter present during session.    Exercises  Gait training with RW   Assessment/Plan    PT Assessment Patient needs continued PT services  PT Problem List Decreased strength;Decreased activity tolerance;Decreased balance;Decreased mobility;Decreased cognition;Decreased knowledge of use of DME;Decreased safety awareness;Decreased knowledge of precautions;Pain       PT Treatment Interventions DME instruction;Gait training;Functional mobility training;Therapeutic activities;Therapeutic exercise;Balance training;Patient/family education    PT Goals (Current goals can be found in the Care Plan section)  Acute Rehab PT Goals Patient Stated Goal: to improve balance and mobility PT Goal Formulation:  With patient Time For Goal Achievement: 12/15/22 Potential to Achieve Goals: Good    Frequency Min 1X/week     Co-evaluation               AM-PAC PT "6 Clicks" Mobility  Outcome Measure Help needed turning from your back to your side while in a flat bed without using bedrails?: None Help needed moving from lying on your back to sitting on the side of a flat bed without using bedrails?: A Little Help needed moving to and from a bed to a chair (including a wheelchair)?: A Little Help needed standing up from a chair using your arms (e.g., wheelchair or bedside chair)?: A Little Help needed to walk in hospital room?: A Little Help needed climbing 3-5 steps with a railing? : A Little 6 Click Score: 19    End of Session Equipment Utilized During Treatment: Gait belt Activity Tolerance: Patient tolerated treatment well Patient left: in bed;with call bell/phone within reach;with bed alarm set;with family/visitor present ([place] in place) Nurse Communication: Mobility status;Precautions PT Visit Diagnosis: Unsteadiness on feet (R26.81);Other abnormalities of gait and mobility (R26.89);Muscle weakness (generalized) (M62.81)    Time: 0981-1914 PT Time Calculation (min) (ACUTE ONLY): 32 min   Charges:   PT Evaluation $PT Eval Low Complexity: 1 Low PT Treatments $Gait Training: 8-22 mins PT General Charges $$ ACUTE PT VISIT: 1 Visit  Hendricks Limes, PT 12/01/22, 5:06 PM

## 2022-12-01 NOTE — Plan of Care (Signed)
  Problem: Education: Goal: Knowledge of General Education information will improve Description: Including pain rating scale, medication(s)/side effects and non-pharmacologic comfort measures Outcome: Progressing   Problem: Health Behavior/Discharge Planning: Goal: Ability to manage health-related needs will improve Outcome: Progressing   Problem: Clinical Measurements: Goal: Ability to maintain clinical measurements within normal limits will improve Outcome: Progressing Goal: Diagnostic test results will improve Outcome: Progressing Goal: Respiratory complications will improve Outcome: Progressing Goal: Cardiovascular complication will be avoided Outcome: Progressing   Problem: Activity: Goal: Risk for activity intolerance will decrease Outcome: Progressing   Problem: Nutrition: Goal: Adequate nutrition will be maintained Outcome: Progressing   Problem: Coping: Goal: Level of anxiety will decrease Outcome: Progressing   Problem: Elimination: Goal: Will not experience complications related to bowel motility Outcome: Progressing Goal: Will not experience complications related to urinary retention Outcome: Progressing   

## 2022-12-01 NOTE — Plan of Care (Signed)

## 2022-12-01 NOTE — Progress Notes (Signed)
PROGRESS NOTE    Kathy [name]  QMV:[id number] DOB: Jul 25, 1960 DOA: 11/28/2022 PCP: Sherlene Shams, MD    Brief Narrative:   62 y.o. female with medical history significant of alcohol abuse with chronic alcoholic gastritis with pernicious anemia on chronic B12 injection, HTN, COPD, HLD, hiatal hernia, GERD, chronic peripheral neuropathy, presented with worsening of nauseous vomiting abdominal pain.   Symptoms started 3 to 4 days ago, patient developed cramping-like epigastric pain with frequent feeling of nauseous vomiting of stomach content, denies any diarrhea no fever or chills.  She could barely tolerate any food, and she threw up her pills as well but able to take only water.  She was told by her PCP on last visit " potassium and calcium levels are low:" And she was prescribed with calcium and potassium supplement however she could not take any of those supplement pills for last 3+ days.  This morning she became very weak and feeling dizzy and decided to come to ED.  10/6: Patient remains tachycardic but hemodynamically stable.  Mentating very poorly.  Confabulating.  10/8: Mentation slowly picking up.   Assessment & Plan:   Principal Problem:   Hypokalemia Active Problems:   Alcohol use disorder   Hypocalcemia   Alcohol withdrawal (HCC)   Malnutrition of moderate degree  Acute alcohol withdrawal Intractable nausea and vomiting Alcoholic gastritis Patient presented with intractable nausea and vomiting however mental status has deteriorated since admission.  Initial abdominal imaging reassuring.  Patient CIWA scores are elevated.  Seems to be tolerating p.o. at this time. Plan: Continue CIWA protocol with as needed Ativan Discontinue diazepam IV thiamine 500 mg 3 times daily x 9 doses, followed by 100 mg daily DC IVF PPI Antiemetics   Hypokalemia Hypomagnesemia Hypocalcemia Hypophosphatemia Likely all nutritional deficiencies in the setting of alcoholism.    Monitor and replace as necessary.   Majority of electrolytes have corrected  Pernicious anemia Continue B12 tablets Daily folic acid  Hypertension Propranolol    DVT prophylaxis: SQ Lovenox Code Status: Full Family Communication: Daughter Jeanella Flattery [phone] on 10/6, at bedside 10/7, 10/8 Disposition Plan: Status is: Inpatient Remains inpatient appropriate because: Acute alcohol withdrawal     Level of care: Telemetry Medical  Consultants:  None  Procedures:  None  Antimicrobials: None   Subjective: Seen and examined.  Daughter at bedside.  Resting in bed.  Mentation improving  Objective: Vitals:   11/30/22 2300 12/01/22 0235 12/01/22 0520 12/01/22 0724  BP: 95/60 99/67  114/82  Pulse: 96 94  98  Resp: 18 18  16   Temp: 99.1 F (37.3 C) 98.9 F (37.2 C)  98.3 F (36.8 C)  TempSrc:      SpO2: 98% 100%  100%  Weight:   54.7 kg   Height:        Intake/Output Summary (Last 24 hours) at 12/01/2022 1332 Last data filed at 12/01/2022 0500 Gross per 24 hour  Intake 2414.72 ml  Output 300 ml  Net 2114.72 ml   Filed Weights   11/28/22 0849 11/30/22 1527 12/01/22 0520  Weight: 55 kg 52 kg 54.7 kg    Examination:  General exam: NAD.  Appears fatigued Respiratory system: Lungs clear.  No work of breathing.  Room air Cardiovascular system: S1-S2, RRR, no murmurs, no pedal edema Gastrointestinal system: Abdomen is nondistended, soft and nontender. No organomegaly or masses felt. Normal bowel sounds heard. Central nervous system: Alert.  Oriented x 1.  No focal deficits Extremities: Symmetric 5 x 5 power.  Skin: No rashes, lesions or ulcers Psychiatry: Judgement and insight appear impaired. Mood & affect confused.     Data Reviewed: I have personally reviewed following labs and imaging studies  CBC: Recent Labs  Lab 11/28/22 0856 11/29/22 0337 11/30/22 0823  WBC 5.9 4.7 6.1  NEUTROABS 3.4  --  3.9  HGB 9.2* 8.5* 9.5*  HCT 26.5* 25.1* 28.2*  MCV  103.5* 103.7* 102.9*  PLT 153 134* 147*   Basic Metabolic Panel: Recent Labs  Lab 11/28/22 0856 11/28/22 1100 11/28/22 1729 11/28/22 1731 11/29/22 0337 11/30/22 0823 12/01/22 0403  NA 132*  --  131*  --  129* 135 134*  K 2.4*  --  3.7  --  3.4* 3.8 4.7  CL 94*  --  98  --  99 99 103  CO2 22  --  21*  --  18* 24 25  GLUCOSE 136*  --  137*  --  182* 108* 107*  BUN <5*  --  <5*  --  <5* <5* <5*  CREATININE 0.53  --  0.45  --  0.56 0.43* 0.43*  CALCIUM 6.1*  --  5.8*  --  6.4* 7.6* 7.2*  MG 0.9*  --   --  1.5* 2.2 1.4* 2.0  PHOS  --  3.1  --   --  1.3*  --  3.8   GFR: Estimated Creatinine Clearance: 59.1 mL/min (A) (by C-G formula based on SCr of 0.43 mg/dL (L)). Liver Function Tests: Recent Labs  Lab 11/28/22 0856 11/28/22 1729 11/29/22 0337  AST 126*  --  130*  ALT 25  --  26  ALKPHOS 94  --  94  BILITOT 1.5*  --  1.7*  PROT 5.8*  --  4.9*  ALBUMIN 2.4* 2.5* 2.1*   Recent Labs  Lab 11/28/22 0856  LIPASE 34   No results for input(s): "AMMONIA" in the last 168 hours. Coagulation Profile: No results for input(s): "INR", "PROTIME" in the last 168 hours. Cardiac Enzymes: No results for input(s): "CKTOTAL", "CKMB", "CKMBINDEX", "TROPONINI" in the last 168 hours. BNP (last 3 results) No results for input(s): "PROBNP" in the last 8760 hours. HbA1C: No results for input(s): "HGBA1C" in the last 72 hours. CBG: No results for input(s): "GLUCAP" in the last 168 hours. Lipid Profile: No results for input(s): "CHOL", "HDL", "LDLCALC", "TRIG", "CHOLHDL", "LDLDIRECT" in the last 72 hours. Thyroid Function Tests: No results for input(s): "TSH", "T4TOTAL", "FREET4", "T3FREE", "THYROIDAB" in the last 72 hours. Anemia Panel: Recent Labs    11/28/22 1729  VITAMINB12 1,104*   Sepsis Labs: No results for input(s): "PROCALCITON", "LATICACIDVEN" in the last 168 hours.  Recent Results (from the past 240 hour(s))  Urine Culture     Status: Abnormal   Collection Time: 11/28/22  10:51 AM   Specimen: Urine, Clean Catch  Result Value Ref Range Status   Specimen Description   Final    URINE, CLEAN CATCH Performed at [facility], [address], [place], [place] [address]    Special Requests   Final    NONE Performed at [facility], [address] [place], [place] [address]    Culture >=100,000 COLONIES/mL ESCHERICHIA COLI (A)  Final   Report Status 11/30/2022 FINAL  Final   Organism ID, Bacteria ESCHERICHIA COLI (A)  Final      Susceptibility   Escherichia coli - MIC*    AMPICILLIN <=2 SENSITIVE Sensitive     CEFAZOLIN <=4 SENSITIVE Sensitive     CEFEPIME <=  0.12 SENSITIVE Sensitive     CEFTRIAXONE <=0.25 SENSITIVE Sensitive     CIPROFLOXACIN <=0.25 SENSITIVE Sensitive     GENTAMICIN <=1 SENSITIVE Sensitive     IMIPENEM <=0.25 SENSITIVE Sensitive     NITROFURANTOIN <=16 SENSITIVE Sensitive     TRIMETH/SULFA <=20 SENSITIVE Sensitive     AMPICILLIN/SULBACTAM <=2 SENSITIVE Sensitive     PIP/TAZO <=4 SENSITIVE Sensitive     * >=100,000 COLONIES/mL ESCHERICHIA COLI  SARS Coronavirus 2 by RT PCR (hospital order, performed in [facility] hospital lab) *cepheid single result test* Anterior Nasal Swab     Status: None   Collection Time: 11/28/22 12:32 PM   Specimen: Anterior Nasal Swab  Result Value Ref Range Status   SARS Coronavirus 2 by RT PCR NEGATIVE NEGATIVE Final    Comment: (NOTE) SARS-CoV-2 target nucleic acids are NOT DETECTED.  The SARS-CoV-2 RNA is generally detectable in upper and lower respiratory specimens during the acute phase of infection. The lowest concentration of SARS-CoV-2 viral copies this assay can detect is 250 copies / mL. A negative result does not preclude SARS-CoV-2 infection and should not be used as the sole basis for treatment or other patient management decisions.  A negative result may occur with improper specimen collection / handling, submission of specimen other than nasopharyngeal swab, presence of  viral mutation(s) within the areas targeted by this assay, and inadequate number of viral copies (<250 copies / mL). A negative result must be combined with clinical observations, patient history, and epidemiological information.  Fact Sheet for Patients:   RoadLapTop.co.za  Fact Sheet for Healthcare Providers: http://kim-miller.com/  This test is not yet approved or  cleared by the [place] FDA and has been authorized for detection and/or diagnosis of SARS-CoV-2 by FDA under an Emergency Use Authorization (EUA).  This EUA will remain in effect (meaning this test can be used) for the duration of the COVID-19 declaration under Section 564(b)(1) of the Act, 21 U.S.C. section 360bbb-3(b)(1), unless the authorization is terminated or revoked sooner.  Performed at [facility], [address] [place], [place] [address]          Radiology Studies: No results found.      Scheduled Meds:  calcium carbonate  1 tablet Oral Q breakfast   vitamin B-12  1,000 mcg Oral Daily   enoxaparin (LOVENOX) injection  40 mg Subcutaneous Q24H   feeding supplement  237 mL Oral TID BM   folic acid  1 mg Intravenous Daily   gabapentin  800 mg Oral TID   multivitamin with minerals  1 tablet Oral Daily   [START ON 12/02/2022] pantoprazole (PROTONIX) IV  40 mg Intravenous Q24H   Continuous Infusions:  promethazine (PHENERGAN) injection (IM or IVPB)     thiamine (VITAMIN B1) injection 500 mg (12/01/22 0527)     LOS: 2 days     Tresa Moore, MD [facility]   If 7PM-7AM, please contact night-coverage  12/01/2022, 1:32 PM

## 2022-12-01 NOTE — Progress Notes (Signed)
Patient is Yellow MEWS, Dr. Arville Care was notified, NaCl bolus was ordered.  12/01/22 2012  Vitals  Temp 98.8 F (37.1 C)  Temp Source Oral  BP 93/73  MAP (mmHg) 81  BP Location Right Arm  BP Method Automatic  Patient Position (if appropriate) Lying  Pulse Rate (!) 118  Pulse Rate Source Monitor  Resp 18  MEWS COLOR  MEWS Score Color Yellow  Oxygen Therapy  SpO2 96 %  O2 Device Room Air  Glasgow Coma Scale  Eye Opening 4  Best Verbal Response (NON-intubated) 4  Best Motor Response 6  Glasgow Coma Scale Score 14  MEWS Score  MEWS Temp 0  MEWS Systolic 1  MEWS Pulse 2  MEWS RR 0  MEWS LOC 0  MEWS Score 3  Provider Notification  Provider Name/Title Dr. Andrez Grime  Date Provider Notified 12/01/22  Time Provider Notified 2038  Method of Notification Page  Notification Reason Other (Comment) (Yellow MEWS)  Provider response Other (Comment) (250 NaCl bolus)  Date of Provider Response 12/01/22  Time of Provider Response 2042

## 2022-12-02 ENCOUNTER — Encounter: Payer: Self-pay | Admitting: Internal Medicine

## 2022-12-02 DIAGNOSIS — E876 Hypokalemia: Secondary | ICD-10-CM | POA: Diagnosis not present

## 2022-12-02 LAB — BASIC METABOLIC PANEL
Anion gap: 11 (ref 5–15)
BUN: <5 mg/dL — ABNORMAL LOW (ref 6–20)
CO2: 20 mmol/L — ABNORMAL LOW (ref 22–32)
Calcium: 7.1 mg/dL — ABNORMAL LOW (ref 8.9–10.3)
Chloride: 104 mmol/L (ref 98–111)
Creatinine, Ser: 0.45 mg/dL (ref 0.44–1.00)
GFR, Estimated: >60 mL/min (ref >60)
Glucose, Bld: 114 mg/dL — ABNORMAL HIGH (ref 70–99)
Potassium: 3.9 mmol/L (ref 3.5–5.1)
Sodium: 135 mmol/L (ref 135–145)

## 2022-12-02 LAB — PHOSPHORUS: Phosphorus: 3.1 mg/dL (ref 2.5–4.6)

## 2022-12-02 LAB — VITAMIN D 25 HYDROXY (VIT D DEFICIENCY, FRACTURES): Vit D, 25-Hydroxy: 33.41 ng/mL (ref 30–100)

## 2022-12-02 LAB — MAGNESIUM: Magnesium: 1.4 mg/dL — ABNORMAL LOW (ref 1.7–2.4)

## 2022-12-02 LAB — GLUCOSE, CAPILLARY: Glucose-Capillary: 139 mg/dL — ABNORMAL HIGH (ref 70–99)

## 2022-12-02 MED ORDER — MAGNESIUM SULFATE 2 GM/50ML IV SOLN
2.0000 g | Freq: Once | INTRAVENOUS | Status: AC
  Administered 2022-12-02: 2 g via INTRAVENOUS
  Filled 2022-12-02: qty 50

## 2022-12-02 MED ORDER — GUAIFENESIN ER 600 MG PO TB12
600.0000 mg | ORAL_TABLET | Freq: Two times a day (BID) | ORAL | Status: DC
  Administered 2022-12-02 – 2022-12-07 (×10): 600 mg via ORAL
  Filled 2022-12-02 (×10): qty 1

## 2022-12-02 MED ORDER — GLUCERNA SHAKE PO LIQD
237.0000 mL | Freq: Three times a day (TID) | ORAL | Status: DC
  Administered 2022-12-02 (×3): 237 mL via ORAL

## 2022-12-02 MED ORDER — MAGNESIUM OXIDE -MG SUPPLEMENT 400 (240 MG) MG PO TABS
400.0000 mg | ORAL_TABLET | Freq: Two times a day (BID) | ORAL | Status: DC
  Administered 2022-12-02 – 2022-12-07 (×11): 400 mg via ORAL
  Filled 2022-12-02 (×11): qty 1

## 2022-12-02 MED ORDER — SODIUM CHLORIDE 0.9 % IV SOLN
2.0000 g | INTRAVENOUS | Status: AC
  Administered 2022-12-02 – 2022-12-06 (×5): 2 g via INTRAVENOUS
  Filled 2022-12-02 (×5): qty 20

## 2022-12-02 MED ORDER — HYDROCOD POLI-CHLORPHE POLI ER 10-8 MG/5ML PO SUER: 5.0000 mL | Freq: Two times a day (BID) | ORAL | Status: DC | PRN

## 2022-12-02 MED ORDER — SODIUM CHLORIDE 0.9 % IV SOLN
500.0000 mg | INTRAVENOUS | Status: DC
  Administered 2022-12-02 – 2022-12-03 (×2): 500 mg via INTRAVENOUS
  Filled 2022-12-02 (×2): qty 5

## 2022-12-02 NOTE — Plan of Care (Signed)

## 2022-12-02 NOTE — Progress Notes (Signed)
New orders for Rocephin and Azithromax IV for PNA d/t CXR results per Dr Valente David.  12/02/22 0114  Assess: MEWS Score  Temp 98.9 F (37.2 C)  BP 94/60  MAP (mmHg) 72  Pulse Rate (!) 109  Resp 20  Level of Consciousness Alert  SpO2 96 %  O2 Device Room Air  Patient Activity (if Appropriate) In bed  Assess: MEWS Score  MEWS Temp 0  MEWS Systolic 1  MEWS Pulse 1  MEWS RR 0  MEWS LOC 0  MEWS Score 2  MEWS Score Color Yellow  Assess: if the MEWS score is Yellow or Red  Were vital signs accurate and taken at a resting state? Yes  Does the patient meet 2 or more of the SIRS criteria? No  MEWS guidelines implemented  No, previously yellow, continue vital signs every 4 hours  Notify: Charge Nurse/RN  Name of Charge Nurse/RN Notified Aurea Graff, RN  Provider Notification  Provider Name/Title Dr Valente David  Date Provider Notified 12/02/22  Time Provider Notified 0145  Method of Notification Page (secure chat)  Notification Reason Other (Comment) (CXR results)  Provider response See new orders  Date of Provider Response 12/02/22  Time of Provider Response 0200  Assess: SIRS CRITERIA  SIRS Temperature  0  SIRS Pulse 1  SIRS Respirations  0  SIRS WBC 0  SIRS Score Sum  1

## 2022-12-02 NOTE — Progress Notes (Signed)
[facility] Progress Note  Patient: Kathy [name]    WUJ:[id number]  DOA: 11/28/2022     Date of Service: the patient was seen and examined on 12/02/2022  Chief Complaint  Patient presents with   Nausea    Nausea with some vomiting and dry heaves   Brief hospital course: 62 y.o. female with medical history significant of alcohol abuse with chronic alcoholic gastritis with pernicious anemia on chronic B12 injection, HTN, COPD, HLD, hiatal hernia, GERD, chronic peripheral neuropathy, presented with worsening of nauseous vomiting abdominal pain.   Symptoms started 3 to 4 days ago, patient developed cramping-like epigastric pain with frequent feeling of nauseous vomiting of stomach content, denies any diarrhea no fever or chills.  She could barely tolerate any food, and she threw up her pills as well but able to take only water.  She was told by her PCP on last visit " potassium and calcium levels are low:" And she was prescribed with calcium and potassium supplement however she could not take any of those supplement pills for last 3+ days.  This morning she became very weak and feeling dizzy and decided to come to ED. 10/6: Patient remains tachycardic but hemodynamically stable.  Mentating very poorly.  Confabulating. 10/8: Mentation slowly picking up.   Assessment and Plan: Acute alcohol withdrawal Intractable nausea and vomiting Alcoholic gastritis Patient presented with intractable nausea and vomiting however mental status has deteriorated since admission.  Initial abdominal imaging reassuring.  Patient CIWA scores are elevated.  Seems to be tolerating p.o. at this time. Plan: Continue CIWA protocol with as needed Ativan Discontinue diazepam IV thiamine 500 mg 3 times daily x 9 doses, followed by 100 mg daily DC IVF PPI Antiemetics   UTI, urine culture growing E. coli, pansensitive. Continue ceftriaxone  Community-acquired pneumonia 10/8 CXR: Interval streaky and hazy  right infrahilar opacity, possible atelectasis or developing pneumonia. Continue ceftriaxone and azithromycin Mucinex 600 mg p.o. twice daily, Tussionex as needed for cough 10/9 follow-up blood cultures  Hypokalemia Hypomagnesemia Hypocalcemia Hypophosphatemia Likely all nutritional deficiencies in the setting of alcoholism.   Monitor and replace as necessary.   Majority of electrolytes have corrected   Pernicious anemia Continue B12 tablets Folic acid deficiency, continue Daily folic acid   Hypertension Propranolol   Body mass index is 22.58 kg/m.  Nutrition Problem: Moderate Malnutrition Etiology: social / environmental circumstances Interventions:  Diet: Regular diet DVT Prophylaxis: Subcutaneous Lovenox   Advance goals of care discussion: Full code  Family Communication: family was present at bedside, at the time of interview.  The pt provided permission to discuss medical plan with the family. Opportunity was given to ask question and all questions were answered satisfactorily.   Disposition:  Pt is from home, admitted with EtOH withdrawal, electrolyte imbalance, developed pneumonia and UTI, still has electrolyte imbalance and on IV antibiotics, which precludes a safe discharge. Discharge to home versus SNF after PT and OT eval, when stable. TOC following for disposition plan  Subjective: No significant events overnight, patient just feels generalized weakness more on the left lower extremity.  Still has productive cough.  No any other complaints.  Physical Exam: General: NAD, lying comfortably Appear in no distress, affect appropriate Eyes: PERRLA ENT: Oral Mucosa Clear, moist  Neck: no JVD,  Cardiovascular: S1 and S2 Present, no Murmur,  Respiratory: Equal air entry bilaterally, bilateral crackles, no wheezing. Abdomen: Bowel Sound present, Soft and no tenderness,  Skin: no rashes Extremities: no Pedal edema, no calf tenderness Neurologic:  without any new  focal findings Gait not checked due to patient safety concerns  Vitals:   12/02/22 0455 12/02/22 0554 12/02/22 0724 12/02/22 1457  BP:  111/77 92/60 99/75   Pulse:  (!) 116 (!) 119 (!) 119  Resp:  20 18 17   Temp:  98 F (36.7 C)  98.3 F (36.8 C)  TempSrc:      SpO2:  99% 95% 97%  Weight: 56 kg     Height:        Intake/Output Summary (Last 24 hours) at 12/02/2022 1534 Last data filed at 12/02/2022 1433 Gross per 24 hour  Intake 1407.6 ml  Output 200 ml  Net 1207.6 ml   Filed Weights   11/30/22 1527 12/01/22 0520 12/02/22 0455  Weight: 52 kg 54.7 kg 56 kg    Data Reviewed: I have personally reviewed and interpreted daily labs, tele strips, imagings as discussed above. I reviewed all nursing notes, pharmacy notes, vitals, pertinent old records I have discussed plan of care as described above with RN and patient/family.  CBC: Recent Labs  Lab 11/28/22 0856 11/29/22 0337 11/30/22 0823  WBC 5.9 4.7 6.1  NEUTROABS 3.4  --  3.9  HGB 9.2* 8.5* 9.5*  HCT 26.5* 25.1* 28.2*  MCV 103.5* 103.7* 102.9*  PLT 153 134* 147*   Basic Metabolic Panel: Recent Labs  Lab 11/28/22 1100 11/28/22 1729 11/28/22 1731 11/29/22 0337 11/30/22 0823 12/01/22 0403 12/02/22 0330  NA  --  131*  --  129* 135 134* 135  K  --  3.7  --  3.4* 3.8 4.7 3.9  CL  --  98  --  99 99 103 104  CO2  --  21*  --  18* 24 25 20*  GLUCOSE  --  137*  --  182* 108* 107* 114*  BUN  --  <5*  --  <5* <5* <5* <5*  CREATININE  --  0.45  --  0.56 0.43* 0.43* 0.45  CALCIUM  --  5.8*  --  6.4* 7.6* 7.2* 7.1*  MG  --   --  1.5* 2.2 1.4* 2.0 1.4*  PHOS 3.1  --   --  1.3*  --  3.8 3.1    Studies: DG Chest 1 View  Result Date: 12/01/2022 CLINICAL DATA:  Altered mental status with cough EXAM: CHEST  1 VIEW COMPARISON:  11/28/2022 FINDINGS: Interval streaky and hazy right infrahilar opacity. Stable cardiomediastinal silhouette with aortic atherosclerosis. No pneumothorax IMPRESSION: Interval streaky and hazy right  infrahilar opacity, possible atelectasis or developing pneumonia. Electronically Signed   By: Jasmine Pang M.D.   On: 12/01/2022 22:42    Scheduled Meds:  calcium carbonate  1 tablet Oral Q breakfast   enoxaparin (LOVENOX) injection  40 mg Subcutaneous Q24H   feeding supplement  237 mL Oral TID BM   feeding supplement (GLUCERNA SHAKE)  237 mL Oral TID BM   folic acid  1 mg Intravenous Daily   gabapentin  800 mg Oral TID   magnesium oxide  400 mg Oral BID   multivitamin with minerals  1 tablet Oral Daily   pantoprazole (PROTONIX) IV  40 mg Intravenous Q24H   Continuous Infusions:  azithromycin 250 mL/hr at 12/02/22 0521   cefTRIAXone (ROCEPHIN)  IV 5 mL/hr at 12/02/22 0240   magnesium sulfate bolus IVPB     promethazine (PHENERGAN) injection (IM or IVPB)     PRN Meds: acetaminophen, ALPRAZolam, cyclobenzaprine, loperamide, ondansetron (ZOFRAN) IV, oxyCODONE, promethazine (PHENERGAN) injection (IM or  IVPB), propranolol  Time spent: 35 minutes  Author: Gillis Santa. MD [facility] 12/02/2022 3:34 PM  To reach On-call, see care teams to locate the attending and reach out to them via [email or web address]. If 7PM-7AM, please contact night-coverage If you still have difficulty reaching the attending provider, please page the [facility] (Director on Call) for [facility] on amion for assistance.

## 2022-12-02 NOTE — Care Management Important Message (Signed)
Important Message  Patient Details  Name: Jonnie Kubly MRN: [id number] Date of Birth: 10/16/1960   Important Message Given:  N/A - LOS <3 / Initial given by admissions     Olegario Messier A Emmerich Cryer 12/02/2022, 10:12 AM

## 2022-12-02 NOTE — Progress Notes (Signed)
Occupational Therapy Treatment Patient Details Name: Kathy [name] MRN: [id number] DOB: 1960/04/25 Today's Date: 12/02/2022   History of present illness Pt is a 62 y.o. female presenting to hospital 11/28/22 with c/o nausea (some vomiting and dry heaves).  Pt admitted with intractable nausea with vomiting, severe hypokalemia, severe hypomagnesemia, hypocalcemia, pernicious anemia.  PMH includes htn, HLD, hiatal hernia, alcohol abuse, chronic peripheral neuropathy, chronic alcoholic gastritis with pernicious anemia.   OT comments  Upon entering the room, pt supine in bed and needing encouragement for therapeutic intervention. Pt appears confused throughout. She is oriented to location and month but not even sure what time of day it is. She reports she has been walking all around room today but daughter shakes head that her report is not accurate. Pt needing min A for bed mobility and she reports B feet pain with mobility tasks. Pt needing mod - max multimodal cuing for safety awareness secondary to impulsivity with mobility in room with use of RW. Pt ambulates to sink for grooming tasks with cuing for sequencing of self care tasks with CGA for balance while standing. She requests to return to bed secondary to fatigue. Call bell and all needed items within reach.       If plan is discharge home, recommend the following:  A lot of help with walking and/or transfers;A lot of help with bathing/dressing/bathroom;Supervision due to cognitive status;Direct supervision/assist for medications management;Direct supervision/assist for financial management;Assistance with cooking/housework;Assist for transportation;Help with stairs or ramp for entrance   Equipment Recommendations  BSC/3in1       Precautions / Restrictions Precautions Precautions: Fall Restrictions Weight Bearing Restrictions: No       Mobility Bed Mobility Overal bed mobility: Needs Assistance Bed Mobility: Supine to Sit, Sit to  Supine     Supine to sit: Min assist Sit to supine: Min assist        Transfers Overall transfer level: Needs assistance Equipment used: None, Rolling walker (2 wheels) Transfers: Sit to/from Stand Sit to Stand: Min assist                 Balance Overall balance assessment: Needs assistance Sitting-balance support: No upper extremity supported, Feet supported Sitting balance-Leahy Scale: Fair     Standing balance support: Bilateral upper extremity supported, Reliant on assistive device for balance Standing balance-Leahy Scale: Fair                             ADL either performed or assessed with clinical judgement   ADL Overall ADL's : Needs assistance/impaired     Grooming: Wash/dry hands;Wash/dry face;Standing;Contact guard assist;Minimal Biomedical engineer: Minimal assistance                  Extremity/Trunk Assessment Upper Extremity Assessment Upper Extremity Assessment: Generalized weakness   Lower Extremity Assessment Lower Extremity Assessment: Generalized weakness        Vision Patient Visual Report: No change from baseline            Cognition Arousal: Alert Behavior During Therapy: WFL for tasks assessed/performed Overall Cognitive Status: Impaired/Different from baseline                                 General Comments: Oriented to person, place, general situation; increased time  to verbalize date                   Pertinent Vitals/ Pain       Pain Assessment Pain Assessment: Faces Faces Pain Scale: Hurts little more Pain Location: L foot when standing (d/t neuropathy) Pain Descriptors / Indicators: Discomfort Pain Intervention(s): Limited activity within patient's tolerance, Monitored during session, Repositioned         Frequency  Min 1X/week        Progress Toward Goals  OT Goals(current goals can now be found in the care plan section)  Progress  towards OT goals: Progressing toward goals      AM-PAC OT "6 Clicks" Daily Activity     Outcome Measure   Help from another person eating meals?: None Help from another person taking care of personal grooming?: A Little Help from another person toileting, which includes using toliet, bedpan, or urinal?: A Lot Help from another person bathing (including washing, rinsing, drying)?: A Lot Help from another person to put on and taking off regular upper body clothing?: A Little Help from another person to put on and taking off regular lower body clothing?: A Lot 6 Click Score: 16    End of Session Equipment Utilized During Treatment: Rolling walker (2 wheels)  OT Visit Diagnosis: Other abnormalities of gait and mobility (R26.89);Muscle weakness (generalized) (M62.81)   Activity Tolerance Patient tolerated treatment well   Patient Left in bed;with call bell/phone within reach;with bed alarm set   Nurse Communication Mobility status        Time: 1191-4782 OT Time Calculation (min): 24 min  Charges: OT General Charges $OT Visit: 1 Visit OT Treatments $Self Care/Home Management : 23-37 mins  Jackquline Denmark, MS, OTR/L , CBIS ascom [phone]  12/02/22, 1:51 PM

## 2022-12-02 NOTE — Plan of Care (Signed)
  Problem: Coping: Goal: Level of anxiety will decrease Outcome: Progressing   Problem: Pain Managment: Goal: General experience of comfort will improve Outcome: Progressing   Problem: Safety: Goal: Ability to remain free from injury will improve Outcome: Progressing   Problem: Health Behavior/Discharge Planning: Goal: Ability to manage health-related needs will improve Outcome: Not Progressing   Problem: Clinical Measurements: Goal: Ability to maintain clinical measurements within normal limits will improve Outcome: Not Progressing

## 2022-12-03 DIAGNOSIS — E876 Hypokalemia: Secondary | ICD-10-CM | POA: Diagnosis not present

## 2022-12-03 LAB — CBC
HCT: 24.1 % — ABNORMAL LOW (ref 36.0–46.0)
Hemoglobin: 8.3 g/dL — ABNORMAL LOW (ref 12.0–15.0)
MCH: 34.6 pg — ABNORMAL HIGH (ref 26.0–34.0)
MCHC: 34.4 g/dL (ref 30.0–36.0)
MCV: 100.4 fL — ABNORMAL HIGH (ref 80.0–100.0)
Platelets: 158 10*3/uL (ref 150–400)
RBC: 2.4 MIL/uL — ABNORMAL LOW (ref 3.87–5.11)
RDW: 15.8 % — ABNORMAL HIGH (ref 11.5–15.5)
WBC: 4.8 10*3/uL (ref 4.0–10.5)
nRBC: 0.4 % — ABNORMAL HIGH (ref 0.0–0.2)

## 2022-12-03 LAB — BASIC METABOLIC PANEL
Anion gap: 7 (ref 5–15)
BUN: <5 mg/dL — ABNORMAL LOW (ref 6–20)
CO2: 21 mmol/L — ABNORMAL LOW (ref 22–32)
Calcium: 7 mg/dL — ABNORMAL LOW (ref 8.9–10.3)
Chloride: 102 mmol/L (ref 98–111)
Creatinine, Ser: 0.44 mg/dL (ref 0.44–1.00)
GFR, Estimated: >60 mL/min (ref >60)
Glucose, Bld: 119 mg/dL — ABNORMAL HIGH (ref 70–99)
Potassium: 3.6 mmol/L (ref 3.5–5.1)
Sodium: 130 mmol/L — ABNORMAL LOW (ref 135–145)

## 2022-12-03 LAB — OSMOLALITY: Osmolality: 276 mosm/kg (ref 275–295)

## 2022-12-03 LAB — MAGNESIUM: Magnesium: 1.8 mg/dL (ref 1.7–2.4)

## 2022-12-03 LAB — PHOSPHORUS: Phosphorus: 2.7 mg/dL (ref 2.5–4.6)

## 2022-12-03 MED ORDER — PANTOPRAZOLE SODIUM 40 MG PO TBEC
40.0000 mg | DELAYED_RELEASE_TABLET | Freq: Every day | ORAL | Status: DC
  Administered 2022-12-04 – 2022-12-07 (×4): 40 mg via ORAL
  Filled 2022-12-03 (×4): qty 1

## 2022-12-03 MED ORDER — ALUM & MAG HYDROXIDE-SIMETH 200-200-20 MG/5ML PO SUSP
15.0000 mL | Freq: Four times a day (QID) | ORAL | Status: DC | PRN
  Administered 2022-12-03 – 2022-12-07 (×2): 15 mL via ORAL
  Filled 2022-12-03 (×2): qty 30

## 2022-12-03 MED ORDER — AZITHROMYCIN 500 MG PO TABS
500.0000 mg | ORAL_TABLET | Freq: Every day | ORAL | Status: AC
  Administered 2022-12-04 – 2022-12-06 (×3): 500 mg via ORAL
  Filled 2022-12-03 (×3): qty 1

## 2022-12-03 MED ORDER — FOLIC ACID 1 MG PO TABS
1.0000 mg | ORAL_TABLET | Freq: Every day | ORAL | Status: DC
  Administered 2022-12-04 – 2022-12-07 (×4): 1 mg via ORAL
  Filled 2022-12-03 (×4): qty 1

## 2022-12-03 NOTE — [place] FL2 (Signed)
[place] MEDICAID FL2 LEVEL OF CARE FORM     IDENTIFICATION  Patient Name: Kathy [name] Birthdate: Sep 26, 1960 Sex: female Admission Date (Current Location): 11/28/2022  [facility] and [place] Number:  Chiropodist and Address:  [facility], [address] [place], [place] [address]      Provider Number: [id number]  Attending Physician Name and Address:  Gillis Santa, MD  Relative Name and Phone Number:  Kamarah, Dopson (Daughter)  [phone]    Current Level of Care: Hospital Recommended Level of Care: Skilled Nursing Facility Prior Approval Number:    Date Approved/Denied:   PASRR Number: pending  Discharge Plan: SNF    Current Diagnoses: Patient Active Problem List   Diagnosis Date Noted   Malnutrition of moderate degree 12/01/2022   Alcohol withdrawal (HCC) 11/29/2022   Hypocalcemia 11/28/2022   Skin tear of forearm without complication, initial encounter 07/01/2022   Tachycardia 07/01/2022   Alcohol use disorder 04/10/2022   Elevated lipase 04/10/2022   Left upper quadrant abdominal pain 04/10/2022   Bronchitis 12/12/2021   Hypoglycemia 05/28/2021   Status post laparoscopic cholecystectomy 05/08/2021   Abdominal aortic atherosclerosis (HCC) 12/21/2020   Thrombocytopenia (HCC) 10/13/2020   Alcoholic hepatitis 09/09/2020   Paraesophageal hiatal hernia 09/09/2020   Constipation 05/21/2020   Mild malnutrition (HCC) 05/20/2020   Mild episode of recurrent major depressive disorder (HCC) 05/20/2020   Essential hypertension 10/03/2019   Insomnia due to anxiety and fear 09/29/2019   GAD (generalized anxiety disorder) 09/19/2018   Poor dentition 05/18/2018   Tobacco abuse 02/22/2018   Atrophy of left kidney 02/22/2018   Diverticulosis of colon without diverticulitis 02/22/2018   MDD (major depressive disorder) 09/13/2017   Osteoporosis 10/17/2016   Pernicious anemia 01/01/2016   Hypomagnesemia 01/01/2016   Hyponatremia  09/10/2015   Hammertoe 07/31/2015   Encounter for preventive health examination 04/23/2015   Alcoholic peripheral neuropathy (HCC) 04/23/2015   Hypokalemia 10/13/2014   Fatty liver, alcoholic 10/13/2014   Alcohol dependence with alcohol-induced mood disorder (HCC)    Alcoholic ketoacidosis 07/12/2014    Orientation RESPIRATION BLADDER Height & Weight     Self, Time, Situation, Place  Normal Continent Weight: 123 lb 7.3 oz (56 kg) Height:  5\' 2"  (157.5 cm)  BEHAVIORAL SYMPTOMS/MOOD NEUROLOGICAL BOWEL NUTRITION STATUS      Continent    AMBULATORY STATUS COMMUNICATION OF NEEDS Skin   Limited Assist Verbally Normal                       Personal Care Assistance Level of Assistance  Bathing, Feeding, Dressing Bathing Assistance: Limited assistance Feeding assistance: Limited assistance Dressing Assistance: Limited assistance     Functional Limitations Info  Sight, Hearing, Speech Sight Info: Adequate Hearing Info: Adequate Speech Info: Adequate    SPECIAL CARE FACTORS FREQUENCY  PT (By licensed PT), OT (By licensed OT)     PT Frequency: 5 times a week OT Frequency: 5 times a week            Contractures Contractures Info: Not present    Additional Factors Info  Code Status, Allergies Code Status Info: FULL Allergies Info: Buspirone  Lyrica (Pregabalin           Current Medications (12/03/2022):  This is the current hospital active medication list Current Facility-Administered Medications  Medication Dose Route Frequency Provider Last Rate Last Admin   acetaminophen (TYLENOL) tablet 500 mg  500 mg Oral Q6H PRN Emeline General, MD   500  mg at 12/03/22 1328   ALPRAZolam (XANAX) tablet 1 mg  1 mg Oral QHS PRN Mikey College T, MD   1 mg at 11/30/22 0138   [START ON 12/04/2022] azithromycin (ZITHROMAX) tablet 500 mg  500 mg Oral Daily Prince Solian F, RPH       calcium carbonate (OS-CAL - dosed in mg of elemental calcium) tablet 1,250 mg  1 tablet Oral Q  breakfast Manuela Schwartz, NP   1,250 mg at 12/03/22 0751   cefTRIAXone (ROCEPHIN) 2 g in sodium chloride 0.9 % 100 mL IVPB  2 g Intravenous Q24H Mansy, Jan A, MD 200 mL/hr at 12/03/22 0228 2 g at 12/03/22 0228   chlorpheniramine-HYDROcodone (TUSSIONEX) 10-8 MG/5ML suspension 5 mL  5 mL Oral Q12H PRN Gillis Santa, MD       cyclobenzaprine (FLEXERIL) tablet 5 mg  5 mg Oral TID PRN Mikey College T, MD   5 mg at 12/02/22 0345   enoxaparin (LOVENOX) injection 40 mg  40 mg Subcutaneous Q24H Mikey College T, MD   40 mg at 12/02/22 2028   feeding supplement (ENSURE ENLIVE / ENSURE PLUS) liquid 237 mL  237 mL Oral TID BM Lolita Patella B, MD   237 mL at 12/03/22 1328   [START ON 12/04/2022] folic acid (FOLVITE) tablet 1 mg  1 mg Oral Daily Prince Solian F, RPH       gabapentin (NEURONTIN) capsule 800 mg  800 mg Oral TID Mikey College T, MD   800 mg at 12/03/22 0944   guaiFENesin (MUCINEX) 12 hr tablet 600 mg  600 mg Oral BID Gillis Santa, MD   600 mg at 12/03/22 0944   loperamide (IMODIUM) capsule 4 mg  4 mg Oral PRN Lolita Patella B, MD       magnesium oxide (MAG-OX) tablet 400 mg  400 mg Oral BID Gillis Santa, MD   400 mg at 12/03/22 0945   multivitamin with minerals tablet 1 tablet  1 tablet Oral Daily Mikey College T, MD   1 tablet at 12/03/22 0944   ondansetron (ZOFRAN) injection 4 mg  4 mg Intravenous Q6H PRN Mikey College T, MD       oxyCODONE (Oxy IR/ROXICODONE) immediate release tablet 5 mg  5 mg Oral Q4H PRN Mikey College T, MD   5 mg at 12/03/22 0645   [START ON 12/04/2022] pantoprazole (PROTONIX) EC tablet 40 mg  40 mg Oral Daily Gillis Santa, MD       promethazine (PHENERGAN) 12.5 mg in sodium chloride 0.9 % 50 mL IVPB  12.5 mg Intravenous Q6H PRN Sreenath, Sudheer B, MD       propranolol (INDERAL) tablet 10-20 mg  10-20 mg Oral BID PRN Emeline General, MD   20 mg at 11/30/22 7829     Discharge Medications: Please see discharge summary for a list of discharge medications.  Relevant  Imaging Results:  Relevant Lab Results:   Additional Information    Community education officer, LCSW

## 2022-12-03 NOTE — TOC Initial Note (Signed)
Transition of Care [facility]) - Initial/Assessment Note    Patient Details  Name: Kathy [name] MRN: [id number] Date of Birth: [birth year]  Transition [facility] CM/SW Contact:    Allena Katz, LCSW Phone Number: 12/03/2022, 3:52 PM  Clinical Narrative:  CSW met with pt at bedside to discuss rehab. Pt is undecided but did say a previous CM gave her a list for Rehabs. She is agreeable to referrals being sent out but states she may decide to go home with [facility]. Pt lives with a 61 year old roommate. Pt states she does not have any DME at home. Pt reports she wants to discuss the plan with her family and get back with me as she reports she has had neuropathy for a while and doesn't know if she really needs the rehab but will agree for search to be started.                 Expected Discharge Plan: Skilled Nursing Facility Barriers to Discharge: Continued Medical Work up   Patient Goals and CMS Choice Patient states their goals for this hospitalization and ongoing recovery are:: SNF/home CMS Medicare.gov Compare Post Acute Care list provided to:: Patient Choice offered to / list presented to : Patient      Expected Discharge Plan and Services                                              Prior Living Arrangements/Services   Lives with:: Other (Comment) (roomate)                   Activities of Daily Living   ADL Screening (condition at time of admission) Independently performs ADLs?: Yes (appropriate for developmental age) Is the patient deaf or have difficulty hearing?: No Does the patient have difficulty seeing, even when wearing glasses/contacts?: No Does the patient have difficulty concentrating, remembering, or making decisions?: No  Permission Sought/Granted                  Emotional Assessment     Affect (typically observed): Accepting Orientation: : Oriented to Self, Oriented to Place, Oriented to  Time, Oriented to Situation       Admission diagnosis:  Hypokalemia [E87.6] Alcohol withdrawal (HCC) [F10.939] Patient Active Problem List   Diagnosis Date Noted   Malnutrition of moderate degree 12/01/2022   Alcohol withdrawal (HCC) 11/29/2022   Hypocalcemia 11/28/2022   Skin tear of forearm without complication, initial encounter 07/01/2022   Tachycardia 07/01/2022   Alcohol use disorder 04/10/2022   Elevated lipase 04/10/2022   Left upper quadrant abdominal pain 04/10/2022   Bronchitis 12/12/2021   Hypoglycemia 05/28/2021   Status post laparoscopic cholecystectomy 05/08/2021   Abdominal aortic atherosclerosis (HCC) 12/21/2020   Thrombocytopenia (HCC) 10/13/2020   Alcoholic hepatitis 09/09/2020   Paraesophageal hiatal hernia 09/09/2020   Constipation 05/21/2020   Mild malnutrition (HCC) 05/20/2020   Mild episode of recurrent major depressive disorder (HCC) 05/20/2020   Essential hypertension 10/03/2019   Insomnia due to anxiety and fear 09/29/2019   GAD (generalized anxiety disorder) 09/19/2018   Poor dentition 05/18/2018   Tobacco abuse 02/22/2018   Atrophy of left kidney 02/22/2018   Diverticulosis of colon without diverticulitis 02/22/2018   MDD (major depressive disorder) 09/13/2017   Osteoporosis 10/17/2016   Pernicious anemia 01/01/2016   Hypomagnesemia 01/01/2016   Hyponatremia 09/10/2015  Hammertoe 07/31/2015   Encounter for preventive health examination 04/23/2015   Alcoholic peripheral neuropathy (HCC) 04/23/2015   Hypokalemia 10/13/2014   Fatty liver, alcoholic 10/13/2014   Alcohol dependence with alcohol-induced mood disorder (HCC)    Alcoholic ketoacidosis 07/12/2014   PCP:  Sherlene Shams, MD Pharmacy:   [facility] [place] - [address] [place] [address] Phone: [phone] Fax: [phone]  [facility] - [place], [place] - [address] [place] [address] Phone: [phone]  Fax: [phone]  [facility] - [place], [place] - [address] [place] [address] Phone: [phone] Fax: [phone]  [facility] #[id number] [place], [place] - [address] AT [facility] [address] [place] [address] Phone: [phone] Fax: [phone]  [facility] #[id number] [place], [place] - [address] AT [facility] [address] [place] [address] Phone: [phone] Fax: [phone]     Social Determinants of Health (SDOH) Social History: SDOH Screenings   Food Insecurity: No Food Insecurity (11/28/2022)  Housing: Low Risk  (11/28/2022)  Transportation Needs: No Transportation Needs (11/28/2022)  Utilities: Not At Risk (11/28/2022)  Alcohol Screen: Low Risk  (09/29/2022)  Depression (PHQ2-9): Low Risk  (09/29/2022)  Financial Resource Strain: Low Risk  (09/29/2022)  Physical Activity: Insufficiently Active (09/29/2022)  Social Connections: Moderately Isolated (09/29/2022)  Stress: No Stress Concern Present (09/29/2022)  Tobacco Use: High Risk (11/28/2022)  Health Literacy: Adequate Health Literacy (09/29/2022)   SDOH Interventions:     Readmission Risk Interventions     No data to display

## 2022-12-03 NOTE — Progress Notes (Signed)
Occupational Therapy Treatment Patient Details Name: Kathy [name] MRN: [id number] DOB: June 14, 1960 Today's Date: 12/03/2022   History of present illness Pt is a 62 y.o. female presenting to hospital 11/28/22 with c/o nausea (some vomiting and dry heaves).  Pt admitted with intractable nausea with vomiting, severe hypokalemia, severe hypomagnesemia, hypocalcemia, pernicious anemia.  PMH includes htn, HLD, hiatal hernia, alcohol abuse, chronic peripheral neuropathy, chronic alcoholic gastritis with pernicious anemia.   OT comments  Pt awake and in bed visiting with daughter upon OT arrival.  Pt agreeable to OOB activity and verbalizing wanting to work with therapy as much as she can while she is here if it helps her to avoid short term rehab.  Pt did report that she would be agreeable to rehab, but wouldn't plan to stay there long.  Pt participated in functional mobility around nurse's station x1 lap with RW and CGA, working to build up tolerance for ADLs and household mobility.  1 short seated rest break on EOB following ambulation d/t elevated HR in the 120s.  Supv-min guard x2 reps for toilet transfer this date with cues for hand placement.  Able to tolerate standing at sink for light grooming tasks with 1 hand on countertop for stability.  OT reviewed functional strengthening activities with pt; see below, as pt continues to report eagerness to increase strength to get home sooner.  Improved bed mobility and functional transfers noted this date, though these tasks remain effortful and require extra time to perform without physical assist.  Pt left in bed with daughter present end of session.  Will continue to follow in the acute setting to maximize strength and activity tolerance for ADLs and mobility.  Continue to recommend SNF upon d/c to work towards return to PLOF.      If plan is discharge home, recommend the following:  Supervision due to cognitive status;Direct supervision/assist for  medications management;Direct supervision/assist for financial management;Assistance with cooking/housework;Assist for transportation;Help with stairs or ramp for entrance;A little help with walking and/or transfers;A little help with bathing/dressing/bathroom   Equipment Recommendations  BSC/3in1    Recommendations for Other Services      Precautions / Restrictions Precautions Precautions: Fall Restrictions Weight Bearing Restrictions: No       Mobility Bed Mobility Overal bed mobility: Needs Assistance Bed Mobility: Supine to Sit, Sit to Supine     Supine to sit: Supervision, Used rails Sit to supine: Supervision, HOB elevated   General bed mobility comments: increase time; pt lifts R leg with UEs to move in/out of bed Patient Response: Cooperative  Transfers Overall transfer level: Needs assistance Equipment used: Rolling walker (2 wheels), None Transfers: Sit to/from Stand Sit to Stand: Contact guard assist                 Balance Overall balance assessment: Needs assistance Sitting-balance support: No upper extremity supported, Feet supported Sitting balance-Leahy Scale: Fair     Standing balance support: During functional activity, Reliant on assistive device for balance Standing balance-Leahy Scale: Fair Standing balance comment: Stood at sink for grooming tasks maintaining 1 hand on coutertop for support.                           ADL either performed or assessed with clinical judgement   ADL Overall ADL's : Needs assistance/impaired     Grooming: Brushing hair;Supervision/safety;Standing Grooming Details (indicate cue type and reason): 1 hand support on sink countertop for balance  Toilet Transfer: Science writer Details (indicate cue type and reason): Performed x2 reps using grab bar to descend, and 1 hand on walker/1 hand pushing from toilet to stand; increased time  and effort         Functional mobility during ADLs: Contact guard assist;Rolling walker (2 wheels) General ADL Comments: Functional mobility around nurse's station x1 lap with RW, working to increase activity tolerance for ADLs/household mobility.  HR120s upon sitting.    Extremity/Trunk Assessment Upper Extremity Assessment Upper Extremity Assessment: Generalized weakness   Lower Extremity Assessment Lower Extremity Assessment: Generalized weakness   Cervical / Trunk Assessment Cervical / Trunk Assessment: Normal    Vision Patient Visual Report: No change from baseline                Cognition Arousal: Alert Behavior During Therapy: WFL for tasks assessed/performed Overall Cognitive Status: Impaired/Different from baseline Area of Impairment: Safety/judgement                         Safety/Judgement: Decreased awareness of safety, Decreased awareness of deficits              Exercises Other Exercises Other Exercises: Reviewed functional strengthening activities to perform during hospital admission, as pt is eager to regain leg strength with hopes to avoid short term rehab: OT encouraged standing at sink for grooming tasks, ambulating to bathroom for toileting rather than using a BSC, ambulating in hallway, and performing mini squats at sink countertop.  OT advised all OOB activity should be with caregiver present d/t weakness and balance deficits.  Pt verbalized understanding.    Shoulder Instructions       General Comments Pt verbalized being agreeable to short term rehab, but stating, "I'm not going to stay there long."  I'll get up here as many times as you want so I can get stronger if it helps me to not have to go to rehab.    Pertinent Vitals/ Pain       Pain Assessment Pain Assessment: Faces Faces Pain Scale: Hurts a little bit Pain Location: bilat feet/neuropathy pain Pain Descriptors / Indicators: Discomfort, Burning Pain Intervention(s):  Limited activity within patient's tolerance, Monitored during session  Home Living                                          Prior Functioning/Environment              Frequency  Min 1X/week        Progress Toward Goals  OT Goals(current goals can now be found in the care plan section)  Progress towards OT goals: Progressing toward goals  Acute Rehab OT Goals Patient Stated Goal: to get my legs stronger and go home OT Goal Formulation: With patient Time For Goal Achievement: 12/15/22 Potential to Achieve Goals: Good  Plan      Co-evaluation                 AM-PAC OT "6 Clicks" Daily Activity     Outcome Measure   Help from another person eating meals?: None Help from another person taking care of personal grooming?: A Little Help from another person toileting, which includes using toliet, bedpan, or urinal?: A Little Help from another person bathing (including washing, rinsing, drying)?: A Lot Help from another person to put on  and taking off regular upper body clothing?: A Little Help from another person to put on and taking off regular lower body clothing?: A Lot 6 Click Score: 17    End of Session Equipment Utilized During Treatment: Rolling walker (2 wheels)  OT Visit Diagnosis: Other abnormalities of gait and mobility (R26.89);Muscle weakness (generalized) (M62.81)   Activity Tolerance Patient tolerated treatment well   Patient Left in bed;with call bell/phone within reach;with family/visitor present   Nurse Communication          Time: 8119-1478 OT Time Calculation (min): 23 min  Charges: OT General Charges $OT Visit: 1 Visit OT Treatments $Self Care/Home Management : 23-37 mins  Danelle Earthly, MS, OTR/L  Otis Dials 12/03/2022, 3:00 PM

## 2022-12-03 NOTE — Progress Notes (Signed)
PT Cancellation Note  Patient Details Name: Merari Pion MRN: [id number] DOB: 15-Sep-1960   Cancelled Treatment:     Treatment 2 x this morning. First attempt. Pt alert long sitting in bed eating breakfast. 2nd attempt, nutritionist in room + pt just returned to bed from getting OOB with her daughter. Per pt/daughter, she was able to exit bed, stand to RW and ambulate to BR. Acute PT will continue to follow per current POC. Will return at a later date.    Rushie Chestnut 12/03/2022, 9:31 AM

## 2022-12-03 NOTE — Plan of Care (Signed)

## 2022-12-03 NOTE — Progress Notes (Signed)
[facility] Progress Note  Patient: Kathy [name]    OZH:[id number]  DOA: 11/28/2022     Date of Service: the patient was seen and examined on 12/03/2022  Chief Complaint  Patient presents with   Nausea    Nausea with some vomiting and dry heaves   Brief hospital course: 62 y.o. female with medical history significant of alcohol abuse with chronic alcoholic gastritis with pernicious anemia on chronic B12 injection, HTN, COPD, HLD, hiatal hernia, GERD, chronic peripheral neuropathy, presented with worsening of nauseous vomiting abdominal pain.   Symptoms started 3 to 4 days ago, patient developed cramping-like epigastric pain with frequent feeling of nauseous vomiting of stomach content, denies any diarrhea no fever or chills.  She could barely tolerate any food, and she threw up her pills as well but able to take only water.  She was told by her PCP on last visit " potassium and calcium levels are low:" And she was prescribed with calcium and potassium supplement however she could not take any of those supplement pills for last 3+ days.  This morning she became very weak and feeling dizzy and decided to come to ED. 10/6: Patient remains tachycardic but hemodynamically stable.  Mentating very poorly.  Confabulating. 10/8: Mentation slowly picking up.   Assessment and Plan: Acute alcohol withdrawal Intractable nausea and vomiting Alcoholic gastritis Patient presented with intractable nausea and vomiting however mental status has deteriorated since admission.  Initial abdominal imaging reassuring.  Patient CIWA scores are elevated.  Seems to be tolerating p.o. at this time. Plan: Continue CIWA protocol with as needed Ativan Discontinue diazepam IV thiamine 500 mg 3 times daily x 9 doses, followed by 100 mg daily DC IVF PPI Antiemetics   UTI, urine culture growing E. coli, pansensitive. Continue ceftriaxone  Community-acquired pneumonia 10/8 CXR: Interval streaky and hazy  right infrahilar opacity, possible atelectasis or developing pneumonia. Continue ceftriaxone and azithromycin Mucinex 600 mg p.o. twice daily, Tussionex as needed for cough 10/9 follow-up blood cultures  Hypokalemia Hypomagnesemia Hypocalcemia Hypophosphatemia Likely all nutritional deficiencies in the setting of alcoholism.   Monitor and replace as necessary.   Majority of electrolytes have corrected   Pernicious anemia Continue B12 tablets Folic acid deficiency, continue Daily folic acid   Hypertension Blood pressure remained soft Discontinued propranolol Continue to monitor BP and titrate medications accordingly   Body mass index is 22.58 kg/m.  Nutrition Problem: Moderate Malnutrition Etiology: social / environmental circumstances Interventions:  Diet: Regular diet DVT Prophylaxis: Subcutaneous Lovenox   Advance goals of care discussion: Full code  Family Communication: family was present at bedside, at the time of interview.  The pt provided permission to discuss medical plan with the family. Opportunity was given to ask question and all questions were answered satisfactorily.   Disposition:  Pt is from home, admitted with EtOH withdrawal, electrolyte imbalance, developed pneumonia and UTI, still has electrolyte imbalance and on IV antibiotics, which precludes a safe discharge. Discharge to SNF, when stable, most likely in 1 to 2 days. TOC following for disposition plan  Subjective: No significant events overnight, patient was resting overnight, patient's daughter was at bedside, stated that she has a poor oral intake, cough is better now, patient is more sleepy today, denied any specific complaints.   Physical Exam: General: NAD, lying comfortably Appear in no distress, affect appropriate Eyes: PERRLA ENT: Oral Mucosa Clear, moist  Neck: no JVD,  Cardiovascular: S1 and S2 Present, no Murmur,  Respiratory: Equal air entry bilaterally, bilateral  crackles, no  wheezing. Abdomen: Bowel Sound present, Soft and no tenderness,  Skin: no rashes Extremities: no Pedal edema, no calf tenderness Neurologic: without any new focal findings Gait not checked due to patient safety concerns  Vitals:   12/02/22 2340 12/03/22 0440 12/03/22 0754 12/03/22 1312  BP: 124/82 102/72 93/61 90/64   Pulse: (!) 113 (!) 113 (!) 114 (!) 113  Resp: 16 16 16 18   Temp: 98.7 F (37.1 C) 98.8 F (37.1 C) 98.2 F (36.8 C) (!) 100.4 F (38 C)  TempSrc: Oral Oral Oral Oral  SpO2: 100% 92% 97% 97%  Weight:      Height:        Intake/Output Summary (Last 24 hours) at 12/03/2022 1525 Last data filed at 12/03/2022 1300 Gross per 24 hour  Intake 637.03 ml  Output 1400 ml  Net -762.97 ml   Filed Weights   11/30/22 1527 12/01/22 0520 12/02/22 0455  Weight: 52 kg 54.7 kg 56 kg    Data Reviewed: I have personally reviewed and interpreted daily labs, tele strips, imagings as discussed above. I reviewed all nursing notes, pharmacy notes, vitals, pertinent old records I have discussed plan of care as described above with RN and patient/family.  CBC: Recent Labs  Lab 11/28/22 0856 11/29/22 0337 11/30/22 0823 12/03/22 0403  WBC 5.9 4.7 6.1 4.8  NEUTROABS 3.4  --  3.9  --   HGB 9.2* 8.5* 9.5* 8.3*  HCT 26.5* 25.1* 28.2* 24.1*  MCV 103.5* 103.7* 102.9* 100.4*  PLT 153 134* 147* 158   Basic Metabolic Panel: Recent Labs  Lab 11/28/22 1100 11/28/22 1729 11/29/22 0337 11/30/22 0823 12/01/22 0403 12/02/22 0330 12/03/22 0403  NA  --    < > 129* 135 134* 135 130*  K  --    < > 3.4* 3.8 4.7 3.9 3.6  CL  --    < > 99 99 103 104 102  CO2  --    < > 18* 24 25 20* 21*  GLUCOSE  --    < > 182* 108* 107* 114* 119*  BUN  --    < > <5* <5* <5* <5* <5*  CREATININE  --    < > 0.56 0.43* 0.43* 0.45 0.44  CALCIUM  --    < > 6.4* 7.6* 7.2* 7.1* 7.0*  MG  --    < > 2.2 1.4* 2.0 1.4* 1.8  PHOS 3.1  --  1.3*  --  3.8 3.1 2.7   < > = values in this interval not displayed.     Studies: No results found.  Scheduled Meds:  [START ON 12/04/2022] azithromycin  500 mg Oral Daily   calcium carbonate  1 tablet Oral Q breakfast   enoxaparin (LOVENOX) injection  40 mg Subcutaneous Q24H   feeding supplement  237 mL Oral TID BM   [START ON 12/04/2022] folic acid  1 mg Oral Daily   gabapentin  800 mg Oral TID   guaiFENesin  600 mg Oral BID   magnesium oxide  400 mg Oral BID   multivitamin with minerals  1 tablet Oral Daily   [START ON 12/04/2022] pantoprazole  40 mg Oral Daily   Continuous Infusions:  cefTRIAXone (ROCEPHIN)  IV 2 g (12/03/22 0228)   promethazine (PHENERGAN) injection (IM or IVPB)     PRN Meds: acetaminophen, ALPRAZolam, chlorpheniramine-HYDROcodone, cyclobenzaprine, loperamide, ondansetron (ZOFRAN) IV, oxyCODONE, promethazine (PHENERGAN) injection (IM or IVPB), propranolol  Time spent: 35 minutes  Author: Gillis Santa. MD [facility] 12/03/2022 3:25 PM  To reach On-call, see care teams to locate the attending and reach out to them via [email or web address]. If 7PM-7AM, please contact night-coverage If you still have difficulty reaching the attending provider, please page the [facility] (Director on Call) for [facility] on amion for assistance.

## 2022-12-03 NOTE — Progress Notes (Signed)
  I have reviewed and concur with this student's documentation.   Allyn Kenner , BSN, RN

## 2022-12-03 NOTE — Plan of Care (Signed)

## 2022-12-03 NOTE — TOC PASRR Note (Signed)
RE: Kathy [name] Date of Birth: 1961-02-12 Date: 12/03/2022     To Whom It May Concern:   Please be advised that the above-named patient will require a short-term nursing home stay - anticipated 30 days or less for rehabilitation and strengthening.  The plan is for return home

## 2022-12-03 NOTE — Progress Notes (Signed)
PHARMACIST - PHYSICIAN COMMUNICATION DR:   Lucianne Muss CONCERNING: Antibiotic IV to Oral Route Change Policy  RECOMMENDATION: This patient is receiving Azithromycin by the intravenous route.  Based on criteria approved by the Pharmacy and Therapeutics Committee, the antibiotic(s) is/are being converted to the equivalent oral dose form(s).   DESCRIPTION: These criteria include: Patient being treated for a respiratory tract infection, urinary tract infection, cellulitis or clostridium difficile associated diarrhea if on metronidazole The patient is not neutropenic and does not exhibit a GI malabsorption state The patient is eating (either orally or via tube) and/or has been taking other orally administered medications for a least 24 hours The patient is improving clinically and has a Tmax < 100.5  If you have questions about this conversion, please contact the Pharmacy Department    Barrie Folk, PharmD

## 2022-12-03 NOTE — Care Management Important Message (Signed)
Important Message  Patient Details  Name: Kathy [name] MRN: [id number] Date of Birth: 09-21-1960   Important Message Given:  N/A - LOS <3 / Initial given by admissions     Olegario Messier A Johnston Maddocks 12/03/2022, 9:32 AM

## 2022-12-03 NOTE — Progress Notes (Signed)
Physical Therapy Treatment Patient Details Name: Kathy [name] MRN: [id number] DOB: 02-08-1961 Today's Date: 12/03/2022   History of Present Illness Pt is a 62 y.o. female presenting to hospital 11/28/22 with c/o nausea (some vomiting and dry heaves).  Pt admitted with intractable nausea with vomiting, severe hypokalemia, severe hypomagnesemia, hypocalcemia, pernicious anemia.  PMH includes htn, HLD, hiatal hernia, alcohol abuse, chronic peripheral neuropathy, chronic alcoholic gastritis with pernicious anemia.    PT Comments  Pt was asleep with supportive daughter at bedside. Author attempted to see pt 2 x earlier this date with pt sound asleep. She easily awakes and is agreeable to session. Pt is A and O x 3 however has poor insight of her deficits and poor overall awareness of situation. Discussed alcohol use and attempted to reorient pt to situation. Pt was agreeable to OOB activity. She requested to attempt OOB activity without use of AD like at her baseline. She is extremely unsteady without BUE in standing. Pt was however able to ambulate fairly safely with use of RW x 100 ft. Tremors were noted with pt attempting to drink from standard cup with HR elevated throughout. At rest HR 115-120s that only elevated minimally with ambulation. Discussed post acute needs. DC recs remain appropriate to maximize pt's safety with ADLs while assisting pt to PLOF.    If plan is discharge home, recommend the following: A little help with walking and/or transfers;A little help with bathing/dressing/bathroom;Assistance with cooking/housework;Assist for transportation;Help with stairs or ramp for entrance     Equipment Recommendations  Rolling walker (2 wheels)       Precautions / Restrictions Precautions Precautions: Fall Restrictions Weight Bearing Restrictions: No     Mobility  Bed Mobility Overal bed mobility: Needs Assistance Bed Mobility: Supine to Sit, Sit to Supine  Supine to sit: Min  assist, HOB elevated, Used rails Sit to supine: Supervision, HOB elevated     Transfers Overall transfer level: Needs assistance Equipment used: Rolling walker (2 wheels), None Transfers: Sit to/from Stand Sit to Stand: Min assist, Contact guard assist  General transfer comment: pt wanted to attempt standing and ambulation without AD. poor standing balance without AD. Highly recommended pt use RW at all times until balance improves    Ambulation/Gait Ambulation/Gait assistance: Contact guard assist Assistive device: Rolling walker (2 wheels) Gait Pattern/deviations: Step-through pattern, Decreased step length - right, Decreased step length - left, Drifts right/left Gait velocity: decreased  General Gait Details: Pt was able to advance gait distances however HR elevated into 130s with pt endorsing fatigue. very minimal SOB noted. Pt not at her baseline.    Balance Overall balance assessment: Needs assistance Sitting-balance support: No upper extremity supported, Feet supported Sitting balance-Leahy Scale: Fair Sitting balance - Comments: slight tremors still noted with pt sitting EOB and reaching for cup. CGA at times while seated EOB but most close supervision   Standing balance support: During functional activity, No upper extremity supported Standing balance-Leahy Scale: Poor Standing balance comment: Pt wanted to attempt OOB activity without AD however even static standing activity without AD is POOR       Cognition Arousal: Alert Behavior During Therapy: WFL for tasks assessed/performed Overall Cognitive Status: Impaired/Different from baseline Area of Impairment: Safety/judgement, Awareness, Orientation      Orientation Level: Situation, Disoriented to  Safety/Judgement: Decreased awareness of safety, Decreased awareness of deficits Awareness: Intellectual   General Comments: Pt was asleep upon entery 3 x this date supportive daughter present to confirm pt's cognition is  not at her baseline. Pt is aware she is in the hospital however poor overall insight. throughout session pt becomes more aware but still has overall poor safety awareness/ poor insight that alcohol has cause some cognition concerns               Pertinent Vitals/Pain Pain Assessment Pain Location: BLE/ " neuropathy pain"/ burning Bilat feet Pain Descriptors / Indicators: Discomfort     PT Goals (current goals can now be found in the care plan section) Acute Rehab PT Goals Patient Stated Goal: go home when able    Frequency    Min 1X/week       AM-PAC PT "6 Clicks" Mobility   Outcome Measure  Help needed turning from your back to your side while in a flat bed without using bedrails?: None Help needed moving from lying on your back to sitting on the side of a flat bed without using bedrails?: A Little Help needed moving to and from a bed to a chair (including a wheelchair)?: A Little Help needed standing up from a chair using your arms (e.g., wheelchair or bedside chair)?: A Little Help needed to walk in hospital room?: A Little Help needed climbing 3-5 steps with a railing? : A Little 6 Click Score: 19    End of Session   Activity Tolerance: Patient tolerated treatment well Patient left: in bed;with call bell/phone within reach;with bed alarm set;with family/visitor present Nurse Communication: Mobility status;Precautions PT Visit Diagnosis: Unsteadiness on feet (R26.81);Other abnormalities of gait and mobility (R26.89);Muscle weakness (generalized) (M62.81)     Time:  -     Charges:                            Jetta Lout PTA 12/03/22, 7:30 AM

## 2022-12-03 NOTE — Progress Notes (Signed)
Nutrition Follow-up  DOCUMENTATION CODES:   Non-severe (moderate) malnutrition in context of chronic illness  INTERVENTION:   -Continue Ensure Enlive po TID, each supplement provides 350 kcal and 20 grams of protein -Continue Magic cup TID with meals, each supplement provides 290 kcal and 9 grams of protein  -D/c Glucerna  NUTRITION DIAGNOSIS:   Moderate Malnutrition related to social / environmental circumstances as evidenced by moderate fat depletion, moderate muscle depletion.  Ongoing  GOAL:   Patient will meet greater than or equal to 90% of their needs  Progressing   MONITOR:   PO intake, Supplement acceptance, Labs, Weight trends, I & O's, Skin  REASON FOR ASSESSMENT:   Consult Assessment of nutrition requirement/status  ASSESSMENT:   62 y/o female with h/o etoh abuse, MDD, anxiety, paraesophageal hernia, COPD, fatty liver, HLD, B12 deficiency, chronic peripheral neuropathy and GERD who is admitted with acute alcohol withdrawal, intractable nausea/vomiting and alcoholic gastritis.  Reviewed I/O's: -1 L x 24 hours and +4.2 L since admission  UOP: 1.4 L x 24 hours   Spoke with pt and daughter at bedside. Pt much more alert today, talking smiling, and joking with this RD during visit. Pt shares that she drinks about a half magic cup daily, one Ensure daily, and "picks" at food from meal trays as well food brought in by family member.   Pt daughter reports that both mentation and PO intake has improved. Daughter shares that pt has a 7 mm hiatal hernia, which contributes to nausea, indigestion, and early satiety. Pt has not experienced these issues on admission and states medications are helping. Daughter shares that getting nutrition into pt can be a struggle, but continue to offer foods and beverages to her.  Daughter concerned about blood sugar levels and ETOH abuse. Reviewed blood sugar levels with daughter- reviewed glycemic targets as well as how stress response  can impact blood sugar levels.   Discussed importance of good meal and supplement intake to promote healing. Pt amenable to continue plan of care.   Wt has been stable since admission.   Medications reviewed and include calcium carbonate, folic acid, magnesium oxide, and phenergan.   Labs reviewed: CBGS: 139 (inpatient orders for glycemic control are none).    Diet Order:   Diet Order             Diet regular Fluid consistency: Thin  Diet effective now                   EDUCATION NEEDS:   Not appropriate for education at this time  Skin:  Skin Assessment: Reviewed RN Assessment  Last BM:  12/02/22 (type 6)  Height:   Ht Readings from Last 1 Encounters:  11/28/22 5\' 2"  (1.575 m)    Weight:   Wt Readings from Last 1 Encounters:  12/02/22 56 kg    Ideal Body Weight:  50 kg  BMI:  Body mass index is 22.58 kg/m.  Estimated Nutritional Needs:   Kcal:  1650-1850  Protein:  85-100 grams  Fluid:  > 1.6 L    Levada Schilling, RD, LDN, CDCES Registered Dietitian III Certified Diabetes Care and Education Specialist Please refer to [facility] for RD and/or RD on-call/weekend/after hours pager

## 2022-12-04 DIAGNOSIS — E876 Hypokalemia: Secondary | ICD-10-CM | POA: Diagnosis not present

## 2022-12-04 LAB — CBC
HCT: 25.1 % — ABNORMAL LOW (ref 36.0–46.0)
Hemoglobin: 8.5 g/dL — ABNORMAL LOW (ref 12.0–15.0)
MCH: 34.8 pg — ABNORMAL HIGH (ref 26.0–34.0)
MCHC: 33.9 g/dL (ref 30.0–36.0)
MCV: 102.9 fL — ABNORMAL HIGH (ref 80.0–100.0)
Platelets: 199 10*3/uL (ref 150–400)
RBC: 2.44 MIL/uL — ABNORMAL LOW (ref 3.87–5.11)
RDW: 16.3 % — ABNORMAL HIGH (ref 11.5–15.5)
WBC: 5.4 10*3/uL (ref 4.0–10.5)
nRBC: 0 % (ref 0.0–0.2)

## 2022-12-04 LAB — BASIC METABOLIC PANEL
Anion gap: 9 (ref 5–15)
BUN: <5 mg/dL — ABNORMAL LOW (ref 6–20)
CO2: 22 mmol/L (ref 22–32)
Calcium: 7.3 mg/dL — ABNORMAL LOW (ref 8.9–10.3)
Chloride: 105 mmol/L (ref 98–111)
Creatinine, Ser: 0.41 mg/dL — ABNORMAL LOW (ref 0.44–1.00)
GFR, Estimated: >60 mL/min (ref >60)
Glucose, Bld: 113 mg/dL — ABNORMAL HIGH (ref 70–99)
Potassium: 3.5 mmol/L (ref 3.5–5.1)
Sodium: 136 mmol/L (ref 135–145)

## 2022-12-04 LAB — MAGNESIUM: Magnesium: 1.6 mg/dL — ABNORMAL LOW (ref 1.7–2.4)

## 2022-12-04 LAB — PHOSPHORUS: Phosphorus: 2.9 mg/dL (ref 2.5–4.6)

## 2022-12-04 MED ORDER — MAGNESIUM SULFATE 2 GM/50ML IV SOLN
2.0000 g | Freq: Once | INTRAVENOUS | Status: AC
  Administered 2022-12-04: 2 g via INTRAVENOUS
  Filled 2022-12-04: qty 50

## 2022-12-04 MED ORDER — THIAMINE MONONITRATE 100 MG PO TABS
100.0000 mg | ORAL_TABLET | Freq: Every day | ORAL | Status: DC
  Administered 2022-12-05 – 2022-12-07 (×3): 100 mg via ORAL
  Filled 2022-12-04 (×3): qty 1

## 2022-12-04 NOTE — TOC Progression Note (Signed)
Transition of Care [facility]) - Progression Note    Patient Details  Name: Kathy [name] MRN: [id number] Date of Birth: 01/18/1961  Transition of Care [facility]) CM/SW Contact  Marlowe Sax, RN Phone Number: 12/04/2022, 11:41 AM  Clinical Narrative:    Met with the patient and reviewed Medicare Star ratings and bed offers, I provided her with a list in writing ashe wants to talk to her "Girls" prior to choosing, I explained that they do not "Hold" a bed until we let them know that she wants it and that Ins will still have to approve but first we have to know where she plans to go to start Ins auth process, she stated understanding and then called for the nurse to undo her IV stating she is tired of all of this    Expected Discharge Plan: Skilled Nursing Facility Barriers to Discharge: Continued Medical Work up  Expected Discharge Plan and Services                                               Social Determinants of Health (SDOH) Interventions SDOH Screenings   Food Insecurity: No Food Insecurity (11/28/2022)  Housing: Low Risk  (11/28/2022)  Transportation Needs: No Transportation Needs (11/28/2022)  Utilities: Not At Risk (11/28/2022)  Alcohol Screen: Low Risk  (09/29/2022)  Depression (PHQ2-9): Low Risk  (09/29/2022)  Financial Resource Strain: Low Risk  (09/29/2022)  Physical Activity: Insufficiently Active (09/29/2022)  Social Connections: Moderately Isolated (09/29/2022)  Stress: No Stress Concern Present (09/29/2022)  Tobacco Use: High Risk (11/28/2022)  Health Literacy: Adequate Health Literacy (09/29/2022)    Readmission Risk Interventions     No data to display

## 2022-12-04 NOTE — Plan of Care (Signed)
  Problem: Education: Goal: Knowledge of General Education information will improve Description: Including pain rating scale, medication(s)/side effects and non-pharmacologic comfort measures Outcome: Progressing   Problem: Clinical Measurements: Goal: Ability to maintain clinical measurements within normal limits will improve Outcome: Progressing Goal: Will remain free from infection Outcome: Progressing Goal: Diagnostic test results will improve Outcome: Progressing Goal: Respiratory complications will improve Outcome: Progressing Goal: Cardiovascular complication will be avoided Outcome: Progressing   Problem: Coping: Goal: Level of anxiety will decrease Outcome: Progressing   Problem: Safety: Goal: Ability to remain free from injury will improve Outcome: Progressing   

## 2022-12-04 NOTE — TOC Progression Note (Signed)
Transition of Care [facility]) - Progression Note    Patient Details  Name: Kathy [name] MRN: [id number] Date of Birth: [birth year]  Transition of Care [facility]) CM/SW Contact  Marlowe Sax, RN Phone Number: 12/04/2022, 1:48 PM  Clinical Narrative:     Daughter Lequita Halt came to the hospital Reviewed potential beds for STR, at this time there are no bed offers I reached out to the local facilities asking if they can review for a bed for STR Awaiting bed offers then will need to start Ins process TOC to call daughter Lequita Halt once bed offers are obtained  Expected Discharge Plan: Skilled Nursing Facility Barriers to Discharge: Continued Medical Work up  Expected Discharge Plan and Services                                               Social Determinants of Health (SDOH) Interventions SDOH Screenings   Food Insecurity: No Food Insecurity (11/28/2022)  Housing: Low Risk  (11/28/2022)  Transportation Needs: No Transportation Needs (11/28/2022)  Utilities: Not At Risk (11/28/2022)  Alcohol Screen: Low Risk  (09/29/2022)  Depression (PHQ2-9): Low Risk  (09/29/2022)  Financial Resource Strain: Low Risk  (09/29/2022)  Physical Activity: Insufficiently Active (09/29/2022)  Social Connections: Moderately Isolated (09/29/2022)  Stress: No Stress Concern Present (09/29/2022)  Tobacco Use: High Risk (11/28/2022)  Health Literacy: Adequate Health Literacy (09/29/2022)    Readmission Risk Interventions     No data to display

## 2022-12-04 NOTE — TOC Progression Note (Signed)
Transition of Care [facility]) - Progression Note    Patient Details  Name: Kathy [name] MRN: [id number] Date of Birth: [birth year]  Transition of Care [facility]) CM/SW Contact  Marlowe Sax, RN Phone Number: 12/04/2022, 8:46 AM  Clinical Narrative:     Uploaded the Documents to [place] Must to obtain PASSR  Expected Discharge Plan: Skilled Nursing Facility Barriers to Discharge: Continued Medical Work up  Expected Discharge Plan and Services                                               Social Determinants of Health (SDOH) Interventions SDOH Screenings   Food Insecurity: No Food Insecurity (11/28/2022)  Housing: Low Risk  (11/28/2022)  Transportation Needs: No Transportation Needs (11/28/2022)  Utilities: Not At Risk (11/28/2022)  Alcohol Screen: Low Risk  (09/29/2022)  Depression (PHQ2-9): Low Risk  (09/29/2022)  Financial Resource Strain: Low Risk  (09/29/2022)  Physical Activity: Insufficiently Active (09/29/2022)  Social Connections: Moderately Isolated (09/29/2022)  Stress: No Stress Concern Present (09/29/2022)  Tobacco Use: High Risk (11/28/2022)  Health Literacy: Adequate Health Literacy (09/29/2022)    Readmission Risk Interventions     No data to display

## 2022-12-04 NOTE — Progress Notes (Signed)
[facility] Progress Note  Patient: Kathy [name]    VHQ:[id number]  DOA: 11/28/2022     Date of Service: the patient was seen and examined on 12/04/2022  Chief Complaint  Patient presents with   Nausea    Nausea with some vomiting and dry heaves   Brief hospital course: 62 y.o. female with medical history significant of alcohol abuse with chronic alcoholic gastritis with pernicious anemia on chronic B12 injection, HTN, COPD, HLD, hiatal hernia, GERD, chronic peripheral neuropathy, presented with worsening of nauseous vomiting abdominal pain.   Symptoms started 3 to 4 days ago, patient developed cramping-like epigastric pain with frequent feeling of nauseous vomiting of stomach content, denies any diarrhea no fever or chills.  She could barely tolerate any food, and she threw up her pills as well but able to take only water.  She was told by her PCP on last visit " potassium and calcium levels are low:" And she was prescribed with calcium and potassium supplement however she could not take any of those supplement pills for last 3+ days.  This morning she became very weak and feeling dizzy and decided to come to ED. 10/6: Patient remains tachycardic but hemodynamically stable.  Mentating very poorly.  Confabulating. 10/8: Mentation slowly picking up.   Assessment and Plan: Acute alcohol withdrawal Intractable nausea and vomiting Alcoholic gastritis Patient presented with intractable nausea and vomiting however mental status has deteriorated since admission.  Initial abdominal imaging reassuring.  Patient CIWA scores are elevated.  Seems to be tolerating p.o. at this time. Plan: Continue CIWA protocol with as needed Ativan Discontinue diazepam IV thiamine 500 mg 3 times daily x 9 doses, followed by 100 mg daily DC IVF PPI Antiemetics   UTI, urine culture growing E. coli, pansensitive. Continue ceftriaxone  Community-acquired pneumonia 10/8 CXR: Interval streaky and hazy  right infrahilar opacity, possible atelectasis or developing pneumonia. Continue ceftriaxone and azithromycin Mucinex 600 mg p.o. twice daily, Tussionex as needed for cough 10/9 follow-up blood cultures  Hyponatremia, resolved Hypokalemia, resolved Hypomagnesemia Hypocalcemia Hypophosphatemia, resolved Likely all nutritional deficiencies in the setting of alcoholism.   Monitor and replace as necessary.   Majority of electrolytes have corrected   Pernicious anemia Continue B12 tablets Folic acid deficiency, continue Daily folic acid   Hypertension Blood pressure remained soft Discontinued propranolol Continue to monitor BP and titrate medications accordingly Sinus tachycardia, TSH within normal range, continue to monitor. Follow TTE  Body mass index is 22.58 kg/m.  Nutrition Problem: Moderate Malnutrition Etiology: social / environmental circumstances Interventions:  Diet: Regular diet DVT Prophylaxis: Subcutaneous Lovenox   Advance goals of care discussion: Full code  Family Communication: family was present at bedside, at the time of interview.  The pt provided permission to discuss medical plan with the family. Opportunity was given to ask question and all questions were answered satisfactorily.   Disposition:  Pt is from home, admitted with EtOH withdrawal, electrolyte imbalance, developed pneumonia and UTI, still has electrolyte imbalance and on IV antibiotics, which precludes a safe discharge. Discharge to SNF, when stable, most likely in 1 to 2 days. TOC following for disposition plan  Subjective: No significant events overnight, patient is AOx3, back to her baseline.  Feels improvement in the cough bilateral has mild cough, had 1 episode of diarrhea in the morning today.  No any other complaints.    Physical Exam: General: NAD, lying comfortably Appear in no distress, affect appropriate Eyes: PERRLA ENT: Oral Mucosa Clear, moist  Neck: no  JVD,   Cardiovascular: S1 and S2 Present, no Murmur,  Respiratory: Equal air entry bilaterally, bilateral crackles, no wheezing. Abdomen: Bowel Sound present, Soft and no tenderness,  Skin: no rashes Extremities: no Pedal edema, no calf tenderness Neurologic: without any new focal findings Gait not checked due to patient safety concerns  Vitals:   12/04/22 0418 12/04/22 0721 12/04/22 0733 12/04/22 1129  BP: 102/63 107/70  114/85  Pulse: (!) 110 (!) 119 (!) 110 (!) 112  Resp: 18 17    Temp: 99.1 F (37.3 C) 99.3 F (37.4 C)    TempSrc:  Oral    SpO2: 93% 100%  98%  Weight:      Height:        Intake/Output Summary (Last 24 hours) at 12/04/2022 1405 Last data filed at 12/04/2022 1300 Gross per 24 hour  Intake 290 ml  Output --  Net 290 ml   Filed Weights   11/30/22 1527 12/01/22 0520 12/02/22 0455  Weight: 52 kg 54.7 kg 56 kg    Data Reviewed: I have personally reviewed and interpreted daily labs, tele strips, imagings as discussed above. I reviewed all nursing notes, pharmacy notes, vitals, pertinent old records I have discussed plan of care as described above with RN and patient/family.  CBC: Recent Labs  Lab 11/28/22 0856 11/29/22 0337 11/30/22 0823 12/03/22 0403 12/04/22 0551  WBC 5.9 4.7 6.1 4.8 5.4  NEUTROABS 3.4  --  3.9  --   --   HGB 9.2* 8.5* 9.5* 8.3* 8.5*  HCT 26.5* 25.1* 28.2* 24.1* 25.1*  MCV 103.5* 103.7* 102.9* 100.4* 102.9*  PLT 153 134* 147* 158 199   Basic Metabolic Panel: Recent Labs  Lab 11/29/22 0337 11/30/22 0823 12/01/22 0403 12/02/22 0330 12/03/22 0403 12/04/22 0551  NA 129* 135 134* 135 130* 136  K 3.4* 3.8 4.7 3.9 3.6 3.5  CL 99 99 103 104 102 105  CO2 18* 24 25 20* 21* 22  GLUCOSE 182* 108* 107* 114* 119* 113*  BUN <5* <5* <5* <5* <5* <5*  CREATININE 0.56 0.43* 0.43* 0.45 0.44 0.41*  CALCIUM 6.4* 7.6* 7.2* 7.1* 7.0* 7.3*  MG 2.2 1.4* 2.0 1.4* 1.8 1.6*  PHOS 1.3*  --  3.8 3.1 2.7 2.9    Studies: No results found.   Scheduled Meds:  azithromycin  500 mg Oral Daily   calcium carbonate  1 tablet Oral Q breakfast   enoxaparin (LOVENOX) injection  40 mg Subcutaneous Q24H   feeding supplement  237 mL Oral TID BM   folic acid  1 mg Oral Daily   gabapentin  800 mg Oral TID   guaiFENesin  600 mg Oral BID   magnesium oxide  400 mg Oral BID   multivitamin with minerals  1 tablet Oral Daily   pantoprazole  40 mg Oral Daily   [START ON 12/05/2022] thiamine  100 mg Oral Daily   Continuous Infusions:  cefTRIAXone (ROCEPHIN)  IV Stopped (12/04/22 0247)   promethazine (PHENERGAN) injection (IM or IVPB)     PRN Meds: acetaminophen, ALPRAZolam, alum & mag hydroxide-simeth, chlorpheniramine-HYDROcodone, cyclobenzaprine, loperamide, ondansetron (ZOFRAN) IV, oxyCODONE, promethazine (PHENERGAN) injection (IM or IVPB), propranolol  Time spent: 35 minutes  Author: Gillis Santa. MD [facility] 12/04/2022 2:05 PM  To reach On-call, see care teams to locate the attending and reach out to them via [email or web address]. If 7PM-7AM, please contact night-coverage If you still have difficulty reaching the attending provider, please page the [facility] (Director on Call) for [facility] on  amion for assistance.

## 2022-12-04 NOTE — Progress Notes (Signed)
Physical Therapy Treatment Patient Details Name: Kathy [name] MRN: [id number] DOB: [birth year] Today's Date: 12/04/2022   History of Present Illness Pt is a 62 y.o. female presenting to hospital 11/28/22 with c/o nausea (some vomiting and dry heaves).  Pt admitted with intractable nausea with vomiting, severe hypokalemia, severe hypomagnesemia, hypocalcemia, pernicious anemia.  PMH includes htn, HLD, hiatal hernia, alcohol abuse, chronic peripheral neuropathy, chronic alcoholic gastritis with pernicious anemia.    PT Comments  Pt was long sitting in bed upon arrival. She is alert and cooperative however still presenting with altered mental status from baseline. PT is A and O x 2 however still has poor safety awareness and poor insight of deficits. She was unaware she was connected to IV. Pt does not use AD at baseline. Currently she remains unsteady with ambulation without RW. Pt will continue to benefit form skilled PT at DC to maximize her independence and safety with all ADLs. Pt endorses wanting to go home however would need 24/7 assistance/supervision for safety.    If plan is discharge home, recommend the following: A little help with walking and/or transfers;A little help with bathing/dressing/bathroom;Assistance with cooking/housework;Assist for transportation;Help with stairs or ramp for entrance     Equipment Recommendations  Rolling walker (2 wheels)       Precautions / Restrictions Precautions Precautions: Fall Restrictions Weight Bearing Restrictions: No     Mobility  Bed Mobility Overal bed mobility: Needs Assistance Bed Mobility: Supine to Sit, Sit to Supine  Supine to sit: Supervision   Transfers Overall transfer level: Needs assistance Equipment used: None, 1 person hand held assist Transfers: Sit to/from Stand Sit to Stand: Contact guard assist, Min assist  General transfer comment: Pt does not use AD at baseline however remains unstable without using RW     Ambulation/Gait Ambulation/Gait assistance: Contact guard assist, Min assist Gait Distance (Feet): 100 Feet Assistive device: 1 person hand held assist Gait Pattern/deviations: Step-through pattern, Narrow base of support, Staggering left, Staggering right Gait velocity: decreased  General Gait Details: Pt was able to ambulate short distance with HHA + however continues to be unsteady at times. cognition and poor insight makes pt a high fall risk. pt does well with use of RW     Balance Overall balance assessment: Needs assistance Sitting-balance support: No upper extremity supported, Feet supported Sitting balance-Leahy Scale: Fair     Standing balance support: Single extremity supported, During functional activity Standing balance-Leahy Scale: Fair     Cognition Arousal: Alert Behavior During Therapy: WFL for tasks assessed/performed Overall Cognitive Status: Impaired/Different from baseline      General Comments: Pt still presents with cognition concerns. poor aware of situation + poor insight of her deficits           General Comments General comments (skin integrity, edema, etc.): Pt remains high fall risk      Pertinent Vitals/Pain Pain Assessment Pain Assessment: 0-10 Pain Score: 4  Pain Location: bilat feet/neuropathy pain Pain Descriptors / Indicators: Discomfort, Burning Pain Intervention(s): Limited activity within patient's tolerance, Monitored during session, Premedicated before session, Repositioned     PT Goals (current goals can now be found in the care plan section) Acute Rehab PT Goals Patient Stated Goal: go home Progress towards PT goals: Progressing toward goals    Frequency    Min 1X/week       AM-PAC PT "6 Clicks" Mobility   Outcome Measure  Help needed turning from your back to your side while in a flat bed  without using bedrails?: None Help needed moving from lying on your back to sitting on the side of a flat bed without using  bedrails?: A Little Help needed moving to and from a bed to a chair (including a wheelchair)?: A Little Help needed standing up from a chair using your arms (e.g., wheelchair or bedside chair)?: A Little Help needed to walk in hospital room?: A Little Help needed climbing 3-5 steps with a railing? : A Little 6 Click Score: 19    End of Session   Activity Tolerance: Patient tolerated treatment well Patient left: in chair;with call bell/phone within reach;with bed alarm set Nurse Communication: Mobility status;Precautions PT Visit Diagnosis: Unsteadiness on feet (R26.81);Other abnormalities of gait and mobility (R26.89);Muscle weakness (generalized) (M62.81)     Time: 1610-9604 PT Time Calculation (min) (ACUTE ONLY): 13 min  Charges:    $Gait Training: 8-22 mins PT General Charges $$ ACUTE PT VISIT: 1 Visit                     Jetta Lout PTA 12/04/22, 12:19 PM

## 2022-12-04 NOTE — Progress Notes (Signed)
OT Cancellation Note  Patient Details Name: Kathy [name] MRN: [id number] DOB: November 30, 1960   Cancelled Treatment:    Reason Eval/Treat Not Completed: Patient declined, no reason specified. Chart reviewed, on arrival pt reports nausea. Has received medication but defers mobility attempts at this time. Will re-attempt as able.   [name], M.S. OTR/L  12/04/22, 11:08 AM  ascom [phone]

## 2022-12-04 NOTE — TOC Progression Note (Signed)
Transition of Care [facility]) - Progression Note    Patient Details  Name: Kathy [name] MRN: [id number] Date of Birth: February 05, 1961  Transition of Care [facility] CM/SW Contact  Marlowe Sax, RN Phone Number: 12/04/2022, 3:31 PM  Clinical Narrative:     Spoke with the daughter Lequita Halt and let her know that the only bed offer at this time is [facility], She is going to talk to the patient and then call me back, I explained we still have to get Ins approval to go on Monday, have to know which facility she will be going to to start Ins process  Expected Discharge Plan: Skilled Nursing Facility Barriers to Discharge: Continued Medical Work up  Expected Discharge Plan and Services                                               Social Determinants of Health (SDOH) Interventions SDOH Screenings   Food Insecurity: No Food Insecurity (11/28/2022)  Housing: Low Risk  (11/28/2022)  Transportation Needs: No Transportation Needs (11/28/2022)  Utilities: Not At Risk (11/28/2022)  Alcohol Screen: Low Risk  (09/29/2022)  Depression (PHQ2-9): Low Risk  (09/29/2022)  Financial Resource Strain: Low Risk  (09/29/2022)  Physical Activity: Insufficiently Active (09/29/2022)  Social Connections: Moderately Isolated (09/29/2022)  Stress: No Stress Concern Present (09/29/2022)  Tobacco Use: High Risk (11/28/2022)  Health Literacy: Adequate Health Literacy (09/29/2022)    Readmission Risk Interventions     No data to display

## 2022-12-04 NOTE — Plan of Care (Signed)

## 2022-12-04 NOTE — TOC Progression Note (Signed)
Transition of Care [facility]) - Progression Note    Patient Details  Name: Kathy [name] MRN: [id number] Date of Birth: 07/06/1960  Transition of Care [facility]) CM/SW Contact  Marlowe Sax, RN Phone Number: 12/04/2022, 3:38 PM  Clinical Narrative:     I received a call from Lequita Halt, The patient's daughter, She is agreeable to go to [facility] Started Ins Berkley Harvey , Plan to DC on Monday  Expected Discharge Plan: Skilled Nursing Facility Barriers to Discharge: Continued Medical Work up  Expected Discharge Plan and Services                                               Social Determinants of Health (SDOH) Interventions SDOH Screenings   Food Insecurity: No Food Insecurity (11/28/2022)  Housing: Low Risk  (11/28/2022)  Transportation Needs: No Transportation Needs (11/28/2022)  Utilities: Not At Risk (11/28/2022)  Alcohol Screen: Low Risk  (09/29/2022)  Depression (PHQ2-9): Low Risk  (09/29/2022)  Financial Resource Strain: Low Risk  (09/29/2022)  Physical Activity: Insufficiently Active (09/29/2022)  Social Connections: Moderately Isolated (09/29/2022)  Stress: No Stress Concern Present (09/29/2022)  Tobacco Use: High Risk (11/28/2022)  Health Literacy: Adequate Health Literacy (09/29/2022)    Readmission Risk Interventions     No data to display

## 2022-12-05 ENCOUNTER — Inpatient Hospital Stay (HOSPITAL_COMMUNITY)
Admit: 2022-12-05 | Discharge: 2022-12-05 | Disposition: A | Discharge status: Home / Self Care | Payer: Medicare HMO | Attending: Student | Admitting: Student

## 2022-12-05 DIAGNOSIS — I509 Heart failure, unspecified: Secondary | ICD-10-CM

## 2022-12-05 DIAGNOSIS — E876 Hypokalemia: Secondary | ICD-10-CM | POA: Diagnosis not present

## 2022-12-05 LAB — CBC
HCT: 25.1 % — ABNORMAL LOW (ref 36.0–46.0)
Hemoglobin: 8.4 g/dL — ABNORMAL LOW (ref 12.0–15.0)
MCH: 35 pg — ABNORMAL HIGH (ref 26.0–34.0)
MCHC: 33.5 g/dL (ref 30.0–36.0)
MCV: 104.6 fL — ABNORMAL HIGH (ref 80.0–100.0)
Platelets: 220 10*3/uL (ref 150–400)
RBC: 2.4 MIL/uL — ABNORMAL LOW (ref 3.87–5.11)
RDW: 16.5 % — ABNORMAL HIGH (ref 11.5–15.5)
WBC: 5.1 10*3/uL (ref 4.0–10.5)
nRBC: 0 % (ref 0.0–0.2)

## 2022-12-05 LAB — PHOSPHORUS: Phosphorus: 3.5 mg/dL (ref 2.5–4.6)

## 2022-12-05 LAB — BASIC METABOLIC PANEL
Anion gap: 8 (ref 5–15)
BUN: <5 mg/dL — ABNORMAL LOW (ref 6–20)
CO2: 22 mmol/L (ref 22–32)
Calcium: 7.3 mg/dL — ABNORMAL LOW (ref 8.9–10.3)
Chloride: 104 mmol/L (ref 98–111)
Creatinine, Ser: 0.43 mg/dL — ABNORMAL LOW (ref 0.44–1.00)
GFR, Estimated: >60 mL/min (ref >60)
Glucose, Bld: 100 mg/dL — ABNORMAL HIGH (ref 70–99)
Potassium: 3.8 mmol/L (ref 3.5–5.1)
Sodium: 134 mmol/L — ABNORMAL LOW (ref 135–145)

## 2022-12-05 LAB — MAGNESIUM: Magnesium: 1.9 mg/dL (ref 1.7–2.4)

## 2022-12-05 MED ORDER — HYDROXYZINE HCL 25 MG PO TABS
25.0000 mg | ORAL_TABLET | Freq: Two times a day (BID) | ORAL | Status: DC
  Administered 2022-12-05 (×2): 25 mg via ORAL
  Filled 2022-12-05 (×3): qty 1

## 2022-12-05 NOTE — Progress Notes (Signed)
Echocardiogram 2D Echocardiogram has been performed.  Kathy [name] 12/05/2022, 6:21 PM

## 2022-12-05 NOTE — Plan of Care (Signed)

## 2022-12-05 NOTE — Progress Notes (Signed)
[facility] Progress Note  Patient: Kathy [name]    OVF:[id number]  DOA: 11/28/2022     Date of Service: the patient was seen and examined on 12/05/2022  Chief Complaint  Patient presents with   Nausea    Nausea with some vomiting and dry heaves   Brief hospital course: 62 y.o. female with medical history significant of alcohol abuse with chronic alcoholic gastritis with pernicious anemia on chronic B12 injection, HTN, COPD, HLD, hiatal hernia, GERD, chronic peripheral neuropathy, presented with worsening of nauseous vomiting abdominal pain.   Symptoms started 3 to 4 days ago, patient developed cramping-like epigastric pain with frequent feeling of nauseous vomiting of stomach content, denies any diarrhea no fever or chills.  She could barely tolerate any food, and she threw up her pills as well but able to take only water.  She was told by her PCP on last visit " potassium and calcium levels are low:" And she was prescribed with calcium and potassium supplement however she could not take any of those supplement pills for last 3+ days.  This morning she became very weak and feeling dizzy and decided to come to ED. 10/6: Patient remains tachycardic but hemodynamically stable.  Mentating very poorly.  Confabulating. 10/8: Mentation slowly picking up.   Assessment and Plan: Acute alcohol withdrawal Intractable nausea and vomiting, Resolved  Alcoholic gastritis Patient presented with intractable nausea and vomiting and altered mental status due to alcohol withdrawal.  Nausea and vomiting resolved, withdrawal symptoms are under control.  AMS resolved patient is back to her baseline.  Continue CIWA protocol with as needed Ativan Discontinue diazepam IV thiamine 500 mg 3 times daily x 9 doses, followed by 100 mg daily DC IVF PPI Antiemetics   UTI, urine culture growing E. coli, pansensitive. Continue ceftriaxone  Community-acquired pneumonia 10/8 CXR: Interval streaky and  hazy right infrahilar opacity, possible atelectasis or developing pneumonia. Continue ceftriaxone and azithromycin Mucinex 600 mg p.o. twice daily, Tussionex as needed for cough 10/9 blood cultures NGTD  Hyponatremia, resolved Hypokalemia, resolved Hypomagnesemia, resolved Hypocalcemia most likely due to low albumin, vitamin D level within normal range Hypophosphatemia, resolved Likely all nutritional deficiencies in the setting of alcoholism.   Monitor and replace as necessary.   Majority of electrolytes have corrected   Pernicious anemia Continue B12 tablets Folic acid deficiency, continue Daily folic acid   Hypertension Blood pressure remained soft Discontinued propranolol Continue to monitor BP and titrate medications accordingly Sinus tachycardia, TSH within normal range, continue to monitor. Follow TTE 10/12 started Atarax 25 mg p.o. twice daily for possible anxiety causing tachycardia  Body mass index is 21.33 kg/m.  Nutrition Problem: Moderate Malnutrition Etiology: social / environmental circumstances Interventions:  Diet: Regular diet DVT Prophylaxis: Subcutaneous Lovenox   Advance goals of care discussion: Full code  Family Communication: family was present at bedside, at the time of interview.  The pt provided permission to discuss medical plan with the family. Opportunity was given to ask question and all questions were answered satisfactorily.   Disposition:  Pt is from home, admitted with EtOH withdrawal, electrolyte imbalance, developed pneumonia and UTI, still has electrolyte imbalance and on IV antibiotics, which precludes a safe discharge. Discharge to SNF, when stable, most likely in 1 to 2 days. TOC following for disposition plan  Subjective: No significant events overnight, patient is AOx3, back to her baseline.  Patient feels improvement in the cough and no shortness of breath.  Denied any diarrhea.  Overall feels improvement.  Physical  Exam: General: NAD, lying comfortably Appear in no distress, affect appropriate Eyes: PERRLA ENT: Oral Mucosa Clear, moist  Neck: no JVD,  Cardiovascular: S1 and S2 Present, no Murmur,  Respiratory: Equal air entry bilaterally, bilateral crackles, no wheezing. Abdomen: Bowel Sound present, Soft and no tenderness,  Skin: no rashes Extremities: no Pedal edema, no calf tenderness Neurologic: without any new focal findings Gait not checked due to patient safety concerns  Vitals:   12/04/22 1539 12/04/22 2341 12/05/22 0455 12/05/22 0745  BP: 103/66 117/71  109/76  Pulse: (!) 103 (!) 110  (!) 102  Resp: 17 15  16   Temp: 97.6 F (36.4 C)   99.2 F (37.3 C)  TempSrc: Oral   Oral  SpO2: 99% 100%  99%  Weight:   52.9 kg   Height:        Intake/Output Summary (Last 24 hours) at 12/05/2022 1137 Last data filed at 12/05/2022 0900 Gross per 24 hour  Intake 750 ml  Output --  Net 750 ml   Filed Weights   12/01/22 0520 12/02/22 0455 12/05/22 0455  Weight: 54.7 kg 56 kg 52.9 kg    Data Reviewed: I have personally reviewed and interpreted daily labs, tele strips, imagings as discussed above. I reviewed all nursing notes, pharmacy notes, vitals, pertinent old records I have discussed plan of care as described above with RN and patient/family.  CBC: Recent Labs  Lab 11/29/22 0337 11/30/22 0823 12/03/22 0403 12/04/22 0551 12/05/22 0655  WBC 4.7 6.1 4.8 5.4 5.1  NEUTROABS  --  3.9  --   --   --   HGB 8.5* 9.5* 8.3* 8.5* 8.4*  HCT 25.1* 28.2* 24.1* 25.1* 25.1*  MCV 103.7* 102.9* 100.4* 102.9* 104.6*  PLT 134* 147* 158 199 220   Basic Metabolic Panel: Recent Labs  Lab 12/01/22 0403 12/02/22 0330 12/03/22 0403 12/04/22 0551 12/05/22 0655  NA 134* 135 130* 136 134*  K 4.7 3.9 3.6 3.5 3.8  CL 103 104 102 105 104  CO2 25 20* 21* 22 22  GLUCOSE 107* 114* 119* 113* 100*  BUN <5* <5* <5* <5* <5*  CREATININE 0.43* 0.45 0.44 0.41* 0.43*  CALCIUM 7.2* 7.1* 7.0* 7.3* 7.3*  MG  2.0 1.4* 1.8 1.6* 1.9  PHOS 3.8 3.1 2.7 2.9 3.5    Studies: No results found.  Scheduled Meds:  azithromycin  500 mg Oral Daily   calcium carbonate  1 tablet Oral Q breakfast   enoxaparin (LOVENOX) injection  40 mg Subcutaneous Q24H   feeding supplement  237 mL Oral TID BM   folic acid  1 mg Oral Daily   gabapentin  800 mg Oral TID   guaiFENesin  600 mg Oral BID   hydrOXYzine  25 mg Oral BID   magnesium oxide  400 mg Oral BID   multivitamin with minerals  1 tablet Oral Daily   pantoprazole  40 mg Oral Daily   thiamine  100 mg Oral Daily   Continuous Infusions:  cefTRIAXone (ROCEPHIN)  IV 2 g (12/05/22 0159)   promethazine (PHENERGAN) injection (IM or IVPB)     PRN Meds: acetaminophen, ALPRAZolam, alum & mag hydroxide-simeth, chlorpheniramine-HYDROcodone, cyclobenzaprine, loperamide, ondansetron (ZOFRAN) IV, oxyCODONE, promethazine (PHENERGAN) injection (IM or IVPB), propranolol  Time spent: 35 minutes  Author: Gillis Santa. MD [facility] 12/05/2022 11:37 AM  To reach On-call, see care teams to locate the attending and reach out to them via [email or web address]. If 7PM-7AM, please contact night-coverage If you still have  difficulty reaching the attending provider, please page the [facility] (Director on Call) for [facility] on amion for assistance.

## 2022-12-05 NOTE — Progress Notes (Signed)
Echocardiogram 2D Echocardiogram has been performed.  Kathy [name] 12/05/2022, 6:22 PM

## 2022-12-05 NOTE — Progress Notes (Signed)
Mobility Specialist - Progress Note   12/05/22 1125  Mobility  Activity Ambulated with assistance in hallway;Stood at bedside;Dangled on edge of bed  Level of Assistance Standby assist, set-up cues, supervision of patient - no hands on  Assistive Device None  Distance Ambulated (ft) 180 ft  Activity Response Tolerated well  Mobility Referral Yes  $Mobility charge 1 Mobility  Mobility Specialist Start Time (ACUTE ONLY) 1037  Mobility Specialist Stop Time (ACUTE ONLY) 1058  Mobility Specialist Time Calculation (min) (ACUTE ONLY) 21 min   Pt supine in bed on RA upon arrival. Pt STS and ambulates in hallway SBA-HHA. Pt returns to bed with needs in reach.   Terrilyn Saver  Mobility Specialist  12/05/22 11:27 AM

## 2022-12-06 DIAGNOSIS — E876 Hypokalemia: Secondary | ICD-10-CM | POA: Diagnosis not present

## 2022-12-06 LAB — ECHOCARDIOGRAM COMPLETE
AR max vel: 1.71 cm2
AV Peak grad: 4 mm[Hg]
Ao pk vel: 1 m/s
Area-P 1/2: 4.91 cm2
Height: 62 in
S' Lateral: 2.4 cm
Weight: 1865.97 [oz_av]

## 2022-12-06 LAB — BASIC METABOLIC PANEL
Anion gap: 8 (ref 5–15)
BUN: <5 mg/dL — ABNORMAL LOW (ref 6–20)
CO2: 25 mmol/L (ref 22–32)
Calcium: 7.9 mg/dL — ABNORMAL LOW (ref 8.9–10.3)
Chloride: 105 mmol/L (ref 98–111)
Creatinine, Ser: 0.52 mg/dL (ref 0.44–1.00)
GFR, Estimated: >60 mL/min (ref >60)
Glucose, Bld: 108 mg/dL — ABNORMAL HIGH (ref 70–99)
Potassium: 3.9 mmol/L (ref 3.5–5.1)
Sodium: 138 mmol/L (ref 135–145)

## 2022-12-06 LAB — CBC
HCT: 26.9 % — ABNORMAL LOW (ref 36.0–46.0)
Hemoglobin: 8.9 g/dL — ABNORMAL LOW (ref 12.0–15.0)
MCH: 34.5 pg — ABNORMAL HIGH (ref 26.0–34.0)
MCHC: 33.1 g/dL (ref 30.0–36.0)
MCV: 104.3 fL — ABNORMAL HIGH (ref 80.0–100.0)
Platelets: 277 10*3/uL (ref 150–400)
RBC: 2.58 MIL/uL — ABNORMAL LOW (ref 3.87–5.11)
RDW: 17.1 % — ABNORMAL HIGH (ref 11.5–15.5)
WBC: 5.4 10*3/uL (ref 4.0–10.5)
nRBC: 0 % (ref 0.0–0.2)

## 2022-12-06 MED ORDER — MIDODRINE HCL 5 MG PO TABS
5.0000 mg | ORAL_TABLET | Freq: Three times a day (TID) | ORAL | Status: DC
  Administered 2022-12-06 – 2022-12-07 (×6): 5 mg via ORAL
  Filled 2022-12-06 (×6): qty 1

## 2022-12-06 NOTE — TOC Progression Note (Signed)
Transition of Care [facility]) - Progression Note    Patient Details  Name: Fatma Rutten MRN: [id number] Date of Birth: [birth year]  Transition of Care [facility]) CM/SW Contact  Bing Quarry, RN Phone Number: 12/06/2022, 10:13 AM  Clinical Narrative:  10/13: Insurance authorization approved from 12/07/22 to 12/09/2022. Plan Auth ID #[id number]. Auth ID: #[id number].  Gabriel Cirri MSN RN CM  Transitions of Care Department [facility] [phone] Weekends Only     Expected Discharge Plan: Skilled Nursing Facility Barriers to Discharge: Continued Medical Work up  Expected Discharge Plan and Services                                               Social Determinants of Health (SDOH) Interventions SDOH Screenings   Food Insecurity: No Food Insecurity (11/28/2022)  Housing: Low Risk  (11/28/2022)  Transportation Needs: No Transportation Needs (11/28/2022)  Utilities: Not At Risk (11/28/2022)  Alcohol Screen: Low Risk  (09/29/2022)  Depression (PHQ2-9): Low Risk  (09/29/2022)  Financial Resource Strain: Low Risk  (09/29/2022)  Physical Activity: Insufficiently Active (09/29/2022)  Social Connections: Moderately Isolated (09/29/2022)  Stress: No Stress Concern Present (09/29/2022)  Tobacco Use: High Risk (11/28/2022)  Health Literacy: Adequate Health Literacy (09/29/2022)    Readmission Risk Interventions     No data to display

## 2022-12-06 NOTE — Plan of Care (Signed)
  Problem: Clinical Measurements: Goal: Ability to maintain clinical measurements within normal limits will improve Outcome: Progressing Goal: Will remain free from infection Outcome: Progressing   Problem: Activity: Goal: Risk for activity intolerance will decrease Outcome: Progressing   Problem: Coping: Goal: Level of anxiety will decrease Outcome: Progressing   Problem: Elimination: Goal: Will not experience complications related to urinary retention Outcome: Progressing   Problem: Pain Managment: Goal: General experience of comfort will improve Outcome: Progressing

## 2022-12-06 NOTE — Progress Notes (Signed)
Mobility Specialist - Progress Note   12/06/22 0941  Mobility  Activity Ambulated with assistance in hallway;Stood at bedside;Dangled on edge of bed  Level of Assistance Standby assist, set-up cues, supervision of patient - no hands on  Assistive Device None  Distance Ambulated (ft) 1000 ft  Activity Response Tolerated well  Mobility Referral Yes  $Mobility charge 1 Mobility  Mobility Specialist Start Time (ACUTE ONLY) 0841  Mobility Specialist Stop Time (ACUTE ONLY) 0902  Mobility Specialist Time Calculation (min) (ACUTE ONLY) 21 min   Pt supine in bed on RA upon arrival. Pt completes bed mobility indep. Pt STS and ambulates to main entrance Supervision with no physical assistance needed. Pt returns to bed with needs in reach.   Terrilyn Saver  Mobility Specialist  12/06/22 9:42 AM

## 2022-12-06 NOTE — TOC Progression Note (Signed)
Transition of Care [facility]) - Progression Note    Patient Details  Name: Kathy [name] MRN: [id number] Date of Birth: 1960-06-27  Transition of Care [facility]) CM/SW Contact  Bing Quarry, RN Phone Number: 12/06/2022, 6:03 PM  Clinical Narrative: 10/13: Daughter Lequita Halt called to review plan for tomorrow. Informed her of insurance authorization. She had questions about transportation to facility and RN CM explained likely by EMS per PT notes that stated No, to private vehicle. 605 PM.   Gabriel Cirri MSN RN CM  Transitions of Care Department [facility] [phone] Weekends Only     Expected Discharge Plan: Skilled Nursing Facility Barriers to Discharge: Continued Medical Work up  Expected Discharge Plan and Services                                               Social Determinants of Health (SDOH) Interventions SDOH Screenings   Food Insecurity: No Food Insecurity (11/28/2022)  Housing: Low Risk  (11/28/2022)  Transportation Needs: No Transportation Needs (11/28/2022)  Utilities: Not At Risk (11/28/2022)  Alcohol Screen: Low Risk  (09/29/2022)  Depression (PHQ2-9): Low Risk  (09/29/2022)  Financial Resource Strain: Low Risk  (09/29/2022)  Physical Activity: Insufficiently Active (09/29/2022)  Social Connections: Moderately Isolated (09/29/2022)  Stress: No Stress Concern Present (09/29/2022)  Tobacco Use: High Risk (11/28/2022)  Health Literacy: Adequate Health Literacy (09/29/2022)    Readmission Risk Interventions     No data to display

## 2022-12-06 NOTE — Progress Notes (Signed)
[facility] Progress Note  Patient: Kathy [name]    WGN:[id number]  DOA: 11/28/2022     Date of Service: the patient was seen and examined on 12/06/2022  Chief Complaint  Patient presents with   Nausea    Nausea with some vomiting and dry heaves   Brief hospital course: 62 y.o. female with medical history significant of alcohol abuse with chronic alcoholic gastritis with pernicious anemia on chronic B12 injection, HTN, COPD, HLD, hiatal hernia, GERD, chronic peripheral neuropathy, presented with worsening of nauseous vomiting abdominal pain.   Symptoms started 3 to 4 days ago, patient developed cramping-like epigastric pain with frequent feeling of nauseous vomiting of stomach content, denies any diarrhea no fever or chills.  She could barely tolerate any food, and she threw up her pills as well but able to take only water.  She was told by her PCP on last visit " potassium and calcium levels are low:" And she was prescribed with calcium and potassium supplement however she could not take any of those supplement pills for last 3+ days.  This morning she became very weak and feeling dizzy and decided to come to ED. 10/6: Patient remains tachycardic but hemodynamically stable.  Mentating very poorly.  Confabulating. 10/8: Mentation slowly picking up.   Assessment and Plan: Acute alcohol withdrawal Intractable nausea and vomiting, Resolved  Alcoholic gastritis Patient presented with intractable nausea and vomiting and altered mental status due to alcohol withdrawal.  Nausea and vomiting resolved, withdrawal symptoms are under control.  AMS resolved patient is back to her baseline.  Continue CIWA protocol with as needed Ativan Discontinue diazepam IV thiamine 500 mg 3 times daily x 9 doses, followed by 100 mg daily DC IVF PPI Antiemetics   UTI, urine culture growing E. coli, pansensitive. S/p ceftriaxone x 5 days completed course  Community-acquired pneumonia 10/8 CXR:  Interval streaky and hazy right infrahilar opacity, possible atelectasis or developing pneumonia. S/p ceftriaxone for 5 days and azithromycin 500 mg p.o. daily for 3 days.  Completed course Mucinex 600 mg p.o. twice daily, Tussionex as needed for cough 10/9 blood cultures NGTD  Hyponatremia, resolved Hypokalemia, resolved Hypomagnesemia, resolved Hypocalcemia most likely due to low albumin, vitamin D level within normal range Hypophosphatemia, resolved Likely all nutritional deficiencies in the setting of alcoholism.   Monitor and replace as necessary.   Majority of electrolytes have corrected   Pernicious anemia Continue B12 tablets Folic acid deficiency, continue Daily folic acid   Hypertension Blood pressure remained soft Discontinued propranolol Continue to monitor BP and titrate medications accordingly Sinus tachycardia, TSH within normal range, continue to monitor. TTE LVEF 60 to 65%, no wall motion abnormality, no significant valvular abnormality. 10/12 s/p Atarax 25 mg p.o. BID x 2 doses given for anxiety, no improvement in the heart rate noted so discontinued.    Body mass index is 21.01 kg/m.  Nutrition Problem: Moderate Malnutrition Etiology: social / environmental circumstances Interventions:  Diet: Regular diet DVT Prophylaxis: Subcutaneous Lovenox   Advance goals of care discussion: Full code  Family Communication: family was present at bedside, at the time of interview.  The pt provided permission to discuss medical plan with the family. Opportunity was given to ask question and all questions were answered satisfactorily.   Disposition:  Pt is from home, admitted with EtOH withdrawal, electrolyte imbalance, developed pneumonia and UTI, still has electrolyte imbalance and on IV antibiotics, which precludes a safe discharge. Discharge to SNF, when stable, most likely in 1 to 2  days. TOC following for disposition plan  Subjective: No significant events  overnight, patient is having some loose stools, had 1 episode today, no severe diarrhea.  Still has mild cough with phlegm production, no any other complaints.  Patient is able to ambulate in the hallway.  Overall she feels improvement.  Physical Exam: General: NAD, lying comfortably Appear in no distress, affect appropriate Eyes: PERRLA ENT: Oral Mucosa Clear, moist  Neck: no JVD,  Cardiovascular: S1 and S2 Present, no Murmur,  Respiratory: Equal air entry bilaterally, bilateral crackles, no wheezing. Abdomen: Bowel Sound present, Soft and no tenderness,  Skin: no rashes Extremities: no Pedal edema, no calf tenderness Neurologic: without any new focal findings Gait not checked due to patient safety concerns  Vitals:   12/06/22 0500 12/06/22 0838 12/06/22 0856 12/06/22 1120  BP:  96/66 98/75 117/70  Pulse:  (!) 111 (!) 103 98  Resp:  18    Temp:  98.1 F (36.7 C) 98.3 F (36.8 C) 98.2 F (36.8 C)  TempSrc:  Oral Oral Oral  SpO2:  100% 100% 99%  Weight: 52.1 kg     Height:        Intake/Output Summary (Last 24 hours) at 12/06/2022 1444 Last data filed at 12/06/2022 1409 Gross per 24 hour  Intake 240 ml  Output --  Net 240 ml   Filed Weights   12/02/22 0455 12/05/22 0455 12/06/22 0500  Weight: 56 kg 52.9 kg 52.1 kg    Data Reviewed: I have personally reviewed and interpreted daily labs, tele strips, imagings as discussed above. I reviewed all nursing notes, pharmacy notes, vitals, pertinent old records I have discussed plan of care as described above with RN and patient/family.  CBC: Recent Labs  Lab 11/30/22 0823 12/03/22 0403 12/04/22 0551 12/05/22 0655 12/06/22 0542  WBC 6.1 4.8 5.4 5.1 5.4  NEUTROABS 3.9  --   --   --   --   HGB 9.5* 8.3* 8.5* 8.4* 8.9*  HCT 28.2* 24.1* 25.1* 25.1* 26.9*  MCV 102.9* 100.4* 102.9* 104.6* 104.3*  PLT 147* 158 199 220 277   Basic Metabolic Panel: Recent Labs  Lab 12/01/22 0403 12/02/22 0330 12/03/22 0403  12/04/22 0551 12/05/22 0655 12/06/22 0542  NA 134* 135 130* 136 134* 138  K 4.7 3.9 3.6 3.5 3.8 3.9  CL 103 104 102 105 104 105  CO2 25 20* 21* 22 22 25   GLUCOSE 107* 114* 119* 113* 100* 108*  BUN <5* <5* <5* <5* <5* <5*  CREATININE 0.43* 0.45 0.44 0.41* 0.43* 0.52  CALCIUM 7.2* 7.1* 7.0* 7.3* 7.3* 7.9*  MG 2.0 1.4* 1.8 1.6* 1.9  --   PHOS 3.8 3.1 2.7 2.9 3.5  --     Studies: ECHOCARDIOGRAM COMPLETE  Result Date: 12/06/2022    ECHOCARDIOGRAM REPORT   Patient Name:   [name] Date of Exam: 12/05/2022 Medical Rec #:  [id number]           Height:       62.0 in Accession #:    [id number]          Weight:       116.6 lb Date of Birth:  Jul 30, 1960           BSA:          1.520 m Patient Age:    61 years            BP:           109/76 mmHg  Patient Gender: F                   HR:           95 bpm. Exam Location:  Inpatient Procedure: 2D Echo, Cardiac Doppler and Color Doppler Indications:     CHF I50.9  History:         Patient has no prior history of Echocardiogram examinations.                  Arrythmias:Tachycardia; Risk Factors:Hypertension and Current                  Smoker.  Sonographer:     Lucendia Herrlich RCS Referring Phys:  [id number] Gillis Santa Diagnosing Phys: Lennie Odor MD IMPRESSIONS  1. Left ventricular ejection fraction, by estimation, is 60 to 65%. The left ventricle has normal function. The left ventricle has no regional wall motion abnormalities. Indeterminate diastolic filling due to E-A fusion.  2. Right ventricular systolic function is normal. The right ventricular size is normal.  3. The mitral valve is grossly normal. Trivial mitral valve regurgitation. No evidence of mitral stenosis.  4. The aortic valve is tricuspid. Aortic valve regurgitation is not visualized. No aortic stenosis is present. FINDINGS  Left Ventricle: Left ventricular ejection fraction, by estimation, is 60 to 65%. The left ventricle has normal function. The left ventricle has no regional wall  motion abnormalities. The left ventricular internal cavity size was normal in size. There is  no left ventricular hypertrophy. Indeterminate diastolic filling due to E-A fusion. Right Ventricle: The right ventricular size is normal. No increase in right ventricular wall thickness. Right ventricular systolic function is normal. Left Atrium: Left atrial size was normal in size. Right Atrium: Right atrial size was normal in size. Pericardium: There is no evidence of pericardial effusion. Presence of epicardial fat layer. Mitral Valve: The mitral valve is grossly normal. Trivial mitral valve regurgitation. No evidence of mitral valve stenosis. Tricuspid Valve: The tricuspid valve is grossly normal. Tricuspid valve regurgitation is mild . No evidence of tricuspid stenosis. Aortic Valve: The aortic valve is tricuspid. Aortic valve regurgitation is not visualized. No aortic stenosis is present. Aortic valve peak gradient measures 4.0 mmHg. Pulmonic Valve: The pulmonic valve was grossly normal. Pulmonic valve regurgitation is trivial. No evidence of pulmonic stenosis. Aorta: The aortic root and ascending aorta are structurally normal, with no evidence of dilitation. Venous: The inferior vena cava was not well visualized. IAS/Shunts: The atrial septum is grossly normal.  LEFT VENTRICLE PLAX 2D LVIDd:         3.40 cm   Diastology LVIDs:         2.40 cm   LV e' medial:    5.33 cm/s LV PW:         0.90 cm   LV E/e' medial:  10.4 LV IVS:        0.90 cm   LV e' lateral:   7.29 cm/s LVOT diam:     1.70 cm   LV E/e' lateral: 7.6 LV SV:         27 LV SV Index:   18 LVOT Area:     2.27 cm  RIGHT VENTRICLE RV S prime:     14.70 cm/s TAPSE (M-mode): 1.8 cm LEFT ATRIUM             Index        RIGHT ATRIUM          Index  LA diam:        2.40 cm 1.58 cm/m   RA Area:     9.92 cm LA Vol (A2C):   25.9 ml 17.04 ml/m  RA Volume:   19.00 ml 12.50 ml/m LA Vol (A4C):   26.7 ml 17.56 ml/m LA Biplane Vol: 28.1 ml 18.49 ml/m  AORTIC VALVE AV  Area (Vmax): 1.71 cm AV Vmax:        99.60 cm/s AV Peak Grad:   4.0 mmHg LVOT Vmax:      74.97 cm/s LVOT Vmean:     47.367 cm/s LVOT VTI:       0.121 m  AORTA Ao Root diam: 3.50 cm Ao Asc diam:  3.50 cm MITRAL VALVE               TRICUSPID VALVE MV Area (PHT): 4.91 cm    TR Peak grad:   31.1 mmHg MV Decel Time: 155 msec    TR Vmax:        279.00 cm/s MV E velocity: 55.55 cm/s MV A velocity: 93.55 cm/s  SHUNTS MV E/A ratio:  0.59        Systemic VTI:  0.12 m                            Systemic Diam: 1.70 cm Lennie Odor MD Electronically signed by Lennie Odor MD Signature Date/Time: 12/06/2022/11:20:27 AM    Final     Scheduled Meds:  calcium carbonate  1 tablet Oral Q breakfast   enoxaparin (LOVENOX) injection  40 mg Subcutaneous Q24H   feeding supplement  237 mL Oral TID BM   folic acid  1 mg Oral Daily   gabapentin  800 mg Oral TID   guaiFENesin  600 mg Oral BID   magnesium oxide  400 mg Oral BID   midodrine  5 mg Oral TID WC   multivitamin with minerals  1 tablet Oral Daily   pantoprazole  40 mg Oral Daily   thiamine  100 mg Oral Daily   Continuous Infusions:  promethazine (PHENERGAN) injection (IM or IVPB)     PRN Meds: acetaminophen, ALPRAZolam, alum & mag hydroxide-simeth, chlorpheniramine-HYDROcodone, cyclobenzaprine, loperamide, ondansetron (ZOFRAN) IV, oxyCODONE, promethazine (PHENERGAN) injection (IM or IVPB), propranolol  Time spent: 35 minutes  Author: Gillis Santa. MD [facility] 12/06/2022 2:44 PM  To reach On-call, see care teams to locate the attending and reach out to them via [email or web address]. If 7PM-7AM, please contact night-coverage If you still have difficulty reaching the attending provider, please page the [facility] (Director on Call) for [facility] on amion for assistance.

## 2022-12-06 NOTE — Plan of Care (Signed)

## 2022-12-07 DIAGNOSIS — R6889 Other general symptoms and signs: Secondary | ICD-10-CM | POA: Diagnosis not present

## 2022-12-07 DIAGNOSIS — M19012 Primary osteoarthritis, left shoulder: Secondary | ICD-10-CM | POA: Diagnosis not present

## 2022-12-07 DIAGNOSIS — F10930 Alcohol use, unspecified with withdrawal, uncomplicated: Secondary | ICD-10-CM | POA: Diagnosis not present

## 2022-12-07 DIAGNOSIS — I959 Hypotension, unspecified: Secondary | ICD-10-CM | POA: Diagnosis not present

## 2022-12-07 DIAGNOSIS — J189 Pneumonia, unspecified organism: Secondary | ICD-10-CM | POA: Diagnosis not present

## 2022-12-07 DIAGNOSIS — E876 Hypokalemia: Secondary | ICD-10-CM | POA: Diagnosis not present

## 2022-12-07 DIAGNOSIS — D510 Vitamin B12 deficiency anemia due to intrinsic factor deficiency: Secondary | ICD-10-CM | POA: Diagnosis not present

## 2022-12-07 DIAGNOSIS — Z7401 Bed confinement status: Secondary | ICD-10-CM | POA: Diagnosis not present

## 2022-12-07 DIAGNOSIS — N390 Urinary tract infection, site not specified: Secondary | ICD-10-CM | POA: Diagnosis not present

## 2022-12-07 DIAGNOSIS — K2920 Alcoholic gastritis without bleeding: Secondary | ICD-10-CM | POA: Diagnosis not present

## 2022-12-07 DIAGNOSIS — R112 Nausea with vomiting, unspecified: Secondary | ICD-10-CM | POA: Diagnosis not present

## 2022-12-07 DIAGNOSIS — F1090 Alcohol use, unspecified, uncomplicated: Secondary | ICD-10-CM | POA: Diagnosis not present

## 2022-12-07 DIAGNOSIS — E878 Other disorders of electrolyte and fluid balance, not elsewhere classified: Secondary | ICD-10-CM | POA: Diagnosis not present

## 2022-12-07 DIAGNOSIS — G621 Alcoholic polyneuropathy: Secondary | ICD-10-CM | POA: Diagnosis not present

## 2022-12-07 LAB — CULTURE, BLOOD (ROUTINE X 2)
Culture: NO GROWTH
Culture: NO GROWTH

## 2022-12-07 LAB — CBC
HCT: 26.8 % — ABNORMAL LOW (ref 36.0–46.0)
Hemoglobin: 8.9 g/dL — ABNORMAL LOW (ref 12.0–15.0)
MCH: 34.6 pg — ABNORMAL HIGH (ref 26.0–34.0)
MCHC: 33.2 g/dL (ref 30.0–36.0)
MCV: 104.3 fL — ABNORMAL HIGH (ref 80.0–100.0)
Platelets: 293 10*3/uL (ref 150–400)
RBC: 2.57 MIL/uL — ABNORMAL LOW (ref 3.87–5.11)
RDW: 16.7 % — ABNORMAL HIGH (ref 11.5–15.5)
WBC: 5.1 10*3/uL (ref 4.0–10.5)
nRBC: 0 % (ref 0.0–0.2)

## 2022-12-07 LAB — BASIC METABOLIC PANEL
Anion gap: 10 (ref 5–15)
BUN: <5 mg/dL — ABNORMAL LOW (ref 6–20)
CO2: 23 mmol/L (ref 22–32)
Calcium: 8 mg/dL — ABNORMAL LOW (ref 8.9–10.3)
Chloride: 104 mmol/L (ref 98–111)
Creatinine, Ser: 0.42 mg/dL — ABNORMAL LOW (ref 0.44–1.00)
GFR, Estimated: >60 mL/min (ref >60)
Glucose, Bld: 90 mg/dL (ref 70–99)
Potassium: 3.7 mmol/L (ref 3.5–5.1)
Sodium: 137 mmol/L (ref 135–145)

## 2022-12-07 LAB — MAGNESIUM: Magnesium: 1.9 mg/dL (ref 1.7–2.4)

## 2022-12-07 LAB — PHOSPHORUS: Phosphorus: 5.2 mg/dL — ABNORMAL HIGH (ref 2.5–4.6)

## 2022-12-07 MED ORDER — MIDODRINE HCL 5 MG PO TABS: 5.0000 mg | ORAL_TABLET | Freq: Three times a day (TID) | ORAL | Status: AC

## 2022-12-07 MED ORDER — ADULT MULTIVITAMIN W/MINERALS CH: 1.0000 | ORAL_TABLET | Freq: Every day | ORAL | Status: AC

## 2022-12-07 MED ORDER — POLYVINYL ALCOHOL 1.4 % OP SOLN
1.0000 [drp] | OPHTHALMIC | Status: DC | PRN
  Filled 2022-12-07: qty 15

## 2022-12-07 MED ORDER — GUAIFENESIN-DM 100-10 MG/5ML PO SYRP: 5.0000 mL | ORAL_SOLUTION | ORAL | Status: DC | PRN

## 2022-12-07 MED ORDER — ENSURE ENLIVE PO LIQD: 237.0000 mL | Freq: Three times a day (TID) | ORAL | Status: DC

## 2022-12-07 MED ORDER — FOLIC ACID 1 MG PO TABS: 1.0000 mg | ORAL_TABLET | Freq: Every day | ORAL | Status: AC

## 2022-12-07 MED ORDER — VITAMIN B-1 100 MG PO TABS: 100.0000 mg | ORAL_TABLET | Freq: Every day | ORAL | Status: AC

## 2022-12-07 MED ORDER — CALCIUM CARBONATE 1250 (500 CA) MG PO TABS: 1.0000 | ORAL_TABLET | Freq: Every day | ORAL | Status: DC

## 2022-12-07 NOTE — Progress Notes (Signed)
Occupational Therapy Treatment Patient Details Name: Kathy [name] MRN: [id number] DOB: [birth year] Today's Date: 12/07/2022   History of present illness Pt is a 62 y.o. female presenting to hospital 11/28/22 with c/o nausea (some vomiting and dry heaves).  Pt admitted with intractable nausea with vomiting, severe hypokalemia, severe hypomagnesemia, hypocalcemia, pernicious anemia.  PMH includes htn, HLD, hiatal hernia, alcohol abuse, chronic peripheral neuropathy, chronic alcoholic gastritis with pernicious anemia.   OT comments  Pt seen for OT treatment on this date. Prior to entering room, per chart pt walked 1019ft with mobility specialist. Upon arrival to room pt supine in bed, agreeable to tx. Pt remained in bed with HOB elevated during the session. Pt's highest level of education is high school diploma. Pt completed SLUMS examination this date scoring 17/30 indicating a positive screen for dementia. Of note, it is not within occupational therapy scope of practice to diagnose cognitive impairments, this screen indicates need for further testing. The SLUMS is a 30 point, 11 question screening questionnaire that tests orientation, memory, attention, and executive function. Pt with noted impairments in short term memory, problem solving, and executive function limiting ability to complete ADL's and IAD's independently. Pt requires 24/7 supervision due to cognitive status. Pt left supine in bed with call bell within reach and all needs met. Pt making good progress toward goals, will continue to follow POC. Discharge recommendation has been updated.        If plan is discharge home, recommend the following:  Supervision due to cognitive status;Direct supervision/assist for medications management;Direct supervision/assist for financial management;Assistance with cooking/housework;Assist for transportation;Help with stairs or ramp for entrance;A little help with walking and/or transfers;A little help  with bathing/dressing/bathroom   Equipment Recommendations  BSC/3in1    Recommendations for Other Services      Precautions / Restrictions Precautions Precautions: Fall Restrictions Weight Bearing Restrictions: No       Mobility Bed Mobility                    Transfers                         Balance                                           ADL either performed or assessed with clinical judgement   ADL                                              Extremity/Trunk Assessment Upper Extremity Assessment Upper Extremity Assessment: Overall WFL for tasks assessed   Lower Extremity Assessment Lower Extremity Assessment: Defer to PT evaluation        Vision Patient Visual Report: No change from baseline     Perception     Praxis      Cognition Arousal: Alert Behavior During Therapy: WFL for tasks assessed/performed Overall Cognitive Status: Impaired/Different from baseline                                          Exercises      Shoulder Instructions       General Comments  Pertinent Vitals/ Pain       Pain Assessment Pain Assessment: No/denies pain  Home Living                                          Prior Functioning/Environment              Frequency  Min 1X/week        Progress Toward Goals  OT Goals(current goals can now be found in the care plan section)  Progress towards OT goals: Progressing toward goals  Acute Rehab OT Goals Patient Stated Goal: to go home. OT Goal Formulation: With patient Time For Goal Achievement: 12/15/22 Potential to Achieve Goals: Good  Plan      Co-evaluation                 AM-PAC OT "6 Clicks" Daily Activity     Outcome Measure   Help from another person eating meals?: None Help from another person taking care of personal grooming?: A Little Help from another person toileting, which  includes using toliet, bedpan, or urinal?: A Little Help from another person bathing (including washing, rinsing, drying)?: A Lot Help from another person to put on and taking off regular upper body clothing?: None Help from another person to put on and taking off regular lower body clothing?: A Little 6 Click Score: 19    End of Session    OT Visit Diagnosis: Other abnormalities of gait and mobility (R26.89);Muscle weakness (generalized) (M62.81)   Activity Tolerance Patient tolerated treatment well   Patient Left in bed;with call bell/phone within reach   Nurse Communication          Time:  -     Charges:    Butch Penny, SOT

## 2022-12-07 NOTE — Progress Notes (Signed)
Report called to [place], LPN at [facility]. AVS printed and put in packet. Awaiting for EMS transport.

## 2022-12-07 NOTE — Progress Notes (Signed)
Mobility Specialist - Progress Note   12/07/22 0855  Mobility  Activity Ambulated independently in hallway;Stood at bedside;Dangled on edge of bed  Level of Assistance Independent  Assistive Device None  Distance Ambulated (ft) 1000 ft  Activity Response Tolerated well  Mobility Referral Yes  $Mobility charge 1 Mobility  Mobility Specialist Start Time (ACUTE ONLY) [id number]  Mobility Specialist Stop Time (ACUTE ONLY) 0853  Mobility Specialist Time Calculation (min) (ACUTE ONLY) 15 min   Pt sitting EOB on RA upon arrival. Pt STS and ambulates to/from [facility] indep with no LOB noted or physical assistance needed. Pt returns to EOB with needs in reach and RN student present.   Kathy [name]  Mobility Specialist  12/07/22 8:56 AM

## 2022-12-07 NOTE — Consult Note (Signed)
[facility] Customer service manager [facility]) Accountable Care Organization (ACO) [facility] Liaison Note  12/07/2022  Kathy [name] Aug 06, 1960 [id number]  Location: [facility] RN Hospital Liaison screened the patient remotely at [facility].  Insurance: [facility] HMO   [name] is a 62 y.o. female who is a Primary Care Patient of Darrick Huntsman, Mar Daring, MD [facility]. The patient was screened for  day readmission hospitalization with noted medium risk score for unplanned readmission risk with 1 IP in 6 months.  The patient was assessed for potential [facility]) Care Management service needs for post hospital transition for care coordination. Review of patient's electronic medical record reveals patient ws admitted for Hypokalemia. Pt will be discharged to a SNF level of care. The facility will continue to address pt's needs. Pt discharging to a non-networking facility and will not be followed by the PAC-RN.  [facility] Care Management/Population Health does not replace or interfere with any arrangements made by the Inpatient Transition of Care team.   For questions contact:   Elliot Cousin, RN, [facility] Liaison [place]   Population Health Office Hours MTWF  8:00 am-6:00 pm [phone] mobile [phone] [Office toll free line] Office Hours are M-F 8:30 - 5 pm [email or web address]

## 2022-12-07 NOTE — TOC Transition Note (Signed)
Transition of Care [facility]) - CM/SW Discharge Note   Patient Details  Name: Kathy [name] MRN: [id number] Date of Birth: [birth year]  Transition of Care [facility] CM/SW Contact:  Marlowe Sax, RN Phone Number: 12/07/2022, 2:45 PM   Clinical Narrative:    Going to  [facility] room 300 P Called daughter Lequita Halt and made her aware Called EMS to transport  There are several ahead of her on the transport list  Final next level of care: Skilled Nursing Facility Barriers to Discharge: Continued Medical Work up   Patient Goals and CMS Choice CMS Medicare.gov Compare Post Acute Care list provided to:: Patient Choice offered to / list presented to : Patient  Discharge Placement                         Discharge Plan and Services Additional resources added to the After Visit Summary for                                       Social Determinants of Health (SDOH) Interventions SDOH Screenings   Food Insecurity: No Food Insecurity (11/28/2022)  Housing: Low Risk  (11/28/2022)  Transportation Needs: No Transportation Needs (11/28/2022)  Utilities: Not At Risk (11/28/2022)  Alcohol Screen: Low Risk  (09/29/2022)  Depression (PHQ2-9): Low Risk  (09/29/2022)  Financial Resource Strain: Low Risk  (09/29/2022)  Physical Activity: Insufficiently Active (09/29/2022)  Social Connections: Moderately Isolated (09/29/2022)  Stress: No Stress Concern Present (09/29/2022)  Tobacco Use: High Risk (11/28/2022)  Health Literacy: Adequate Health Literacy (09/29/2022)     Readmission Risk Interventions     No data to display

## 2022-12-07 NOTE — Progress Notes (Signed)
Patient transported by EMS out of the unit. Vitals stable, fully alert/oriented.

## 2022-12-07 NOTE — Plan of Care (Signed)

## 2022-12-07 NOTE — Discharge Summary (Signed)
[facility] Discharge Summary   Patient: Kathy [name] GLO:[id number]  PCP: Sherlene Shams, MD  Date of admission: 11/28/2022   Date of discharge:  12/07/2022     Discharge Diagnoses:  Principal Problem:   Hypokalemia Active Problems:   Alcohol use disorder   Hypocalcemia   Alcohol withdrawal (HCC)   Malnutrition of moderate degree   Admitted From: Home Disposition:  SNF   Recommendations for Outpatient Follow-up:  Follow-up with PCP, patient should be seen by an MD in 1 to 2 days, continue monitor BP, it was low so started on midodrine 5 mg p.o. 3 times daily with holding parameters Follow up LABS/TEST: Monitor BP   Diet recommendation: Regular diet  Activity: The patient is advised to gradually reintroduce usual activities, as tolerated  Discharge Condition: stable  Code Status: Full code   History of present illness: As per the H and P dictated on admission  Hospital Course:  62 y.o. female with medical history significant of alcohol abuse with chronic alcoholic gastritis with pernicious anemia on chronic B12 injection, HTN, COPD, HLD, hiatal hernia, GERD, chronic peripheral neuropathy, presented with worsening of nauseous vomiting abdominal pain. Symptoms started 3 to 4 days ago, patient developed cramping-like epigastric pain with frequent feeling of nauseous vomiting of stomach content, denies any diarrhea no fever or chills.  She could barely tolerate any food, and she threw up her pills as well but able to take only water.  She was told by her PCP on last visit " potassium and calcium levels are low:" And she was prescribed with calcium and potassium supplement however she could not take any of those supplement pills for last 3+ days.  This morning she became very weak and feeling dizzy and decided to come to ED. 10/6: Patient remains tachycardic but hemodynamically stable.  Mentating very poorly.  Confabulating. 10/14: Mental status improved, patient is  AOx3.     Assessment and Plan: # Acute alcohol withdrawal # Intractable nausea and vomiting, Resolved  # Alcoholic gastritis Patient presented with intractable nausea and vomiting and altered mental status due to alcohol withdrawal.  Nausea and vomiting resolved, withdrawal symptoms are under control.  AMS resolved patient is back to her baseline. S/p CIWA protocol with Ativan prn. Discontinue diazepam S/p IV thiamine 500 mg 3 times daily x 9 doses, followed by 100 mg daily. DC IVF, continue PPI and Zofran as needed. # UTI, urine culture growing E. coli, pansensitive. S/p ceftriaxone x 5 days completed course # Community-acquired pneumonia: On 10/8 CXR: Interval streaky and hazy right infrahilar opacity, possible atelectasis or developing pneumonia. S/p ceftriaxone for 5 days and azithromycin 500 mg p.o. daily for 3 days.  Completed course. S/p Mucinex 600 mg p.o. twice daily, Tussionex as needed for cough. 10/9 blood cultures NGTD.  Continue Robitussin DM as needed for cough # Hyponatremia, resolved # Hypokalemia, resolved # Hypomagnesemia, resolved # Hypocalcemia most likely due to low albumin, vitamin D level within normal range # Hypophosphatemia, resolved Likely all nutritional deficiencies in the setting of alcoholism.  Majority of electrolytes have corrected Continue protein supplement.   # Pernicious anemia: Continue B12 tablets # Folic acid deficiency, continue Daily folic acid # Hypertension: Blood pressure remained soft,Discontinued propranolol. Continue to monitor BP and titrate medications accordingly. Sinus tachycardia, TSH within normal range, continue to monitor. TTE LVEF 60 to 65%, no wall motion abnormality, no significant valvular abnormality. 10/12 s/p Atarax 25 mg p.o. BID x 2 doses given for anxiety, no improvement  in the heart rate noted so discontinued.  Started midodrine 5 mg p.o. 3 times daily with holding parameters to hold if SBP greater than 110 mmHg.  Monitor BP  and titrate medications accordingly.  Body mass index is 20.52 kg/m.  Nutrition Problem: Moderate Malnutrition Etiology: social / environmental circumstances Nutrition Interventions:  - Patient was instructed, not to drive, operate heavy machinery, perform activities at heights, swimming or participation in water activities or provide baby sitting services while on Pain, Sleep and Anxiety Medications; until her outpatient Physician has advised to do so again.  - Also recommended to not to take more than prescribed Pain, Sleep and Anxiety Medications.  Patient was seen by physical therapy, who recommended Therapy, SNF placement,which was arranged. On the day of the discharge the patient's vitals were stable, and no other acute medical condition were reported by patient. the patient was felt safe to be discharge at [facility] .  Consultants: None Procedures: .None  Discharge Exam: General: Appear in no distress, no Rash; Oral Mucosa Clear, moist. Cardiovascular: S1 and S2 Present, no Murmur, Respiratory: normal respiratory effort, Bilateral Air entry present and no Crackles, no wheezes Abdomen: Bowel Sound present, Soft and no tenderness, no hernia Extremities: no Pedal edema, no calf tenderness Neurology: alert and oriented to time, place, and person affect appropriate.  Filed Weights   12/05/22 0455 12/06/22 0500 12/07/22 0450  Weight: 52.9 kg 52.1 kg 50.9 kg   Vitals:   12/07/22 0747 12/07/22 0830  BP: 107/73   Pulse: (!) 102 100  Resp: 20   Temp: 98.3 F (36.8 C)   SpO2: 99%     DISCHARGE MEDICATION: Allergies as of 12/07/2022       Reactions   Buspirone Hives   Lyrica [pregabalin] Other (See Comments)   Blurred vision         Medication List     TAKE these medications    calcium carbonate 1250 (500 Ca) MG tablet Commonly known as: OS-CAL - dosed in mg of elemental calcium Take 1 tablet (1,250 mg total) by mouth daily with breakfast. Start taking on: December 08, 2022   feeding supplement Liqd Take 237 mLs by mouth 3 (three) times daily between meals.   folic acid 1 MG tablet Commonly known as: FOLVITE Take 1 tablet (1 mg total) by mouth daily. Start taking on: December 08, 2022   gabapentin 400 MG capsule Commonly known as: NEURONTIN TAKE 2 CAPSULES (800 MG) BY MOUTH 3 TIMES DAILY   guaiFENesin-dextromethorphan 100-10 MG/5ML syrup Commonly known as: ROBITUSSIN DM Take 5 mLs by mouth every 4 (four) hours as needed for cough.   midodrine 5 MG tablet Commonly known as: PROAMATINE Take 1 tablet (5 mg total) by mouth 3 (three) times daily with meals. Hold if SBP >110   multivitamin with minerals Tabs tablet Take 1 tablet by mouth daily. Start taking on: December 08, 2022   omeprazole 40 MG capsule Commonly known as: PRILOSEC TAKE ONE CAPSULE BY MOUTH TWO TIMES DAILY   ondansetron 8 MG disintegrating tablet Commonly known as: ZOFRAN-ODT TAKE ONE TABLET UNDER THE TONGUE EVERY 8HOURS AS NEEDED FOR NAUSEA AND VOMITING.   SYRINGE 3CC/25GX1" 25G X 1" 3 ML Misc Use for b12 injections   thiamine 100 MG tablet Commonly known as: Vitamin B-1 Take 1 tablet (100 mg total) by mouth daily. Start taking on: December 08, 2022       Allergies  Allergen Reactions   Buspirone Hives   Lyrica [Pregabalin] Other (  See Comments)    Blurred vision    Discharge Instructions     Call MD for:  difficulty breathing, headache or visual disturbances   Complete by: As directed    Call MD for:  extreme fatigue   Complete by: As directed    Call MD for:  persistant dizziness or light-headedness   Complete by: As directed    Call MD for:  persistant nausea and vomiting   Complete by: As directed    Call MD for:  severe uncontrolled pain   Complete by: As directed    Call MD for:  temperature >100.4   Complete by: As directed    Diet - low sodium heart healthy   Complete by: As directed    Discharge instructions   Complete by: As directed     Follow-up with PCP, patient should be seen by an MD in 1 to 2 days, continue monitor BP, it was low so started on midodrine 5 mg p.o. 3 times daily with holding parameters.   Increase activity slowly   Complete by: As directed        The results of significant diagnostics from this hospitalization (including imaging, microbiology, ancillary and laboratory) are listed below for reference.    Significant Diagnostic Studies: ECHOCARDIOGRAM COMPLETE  Result Date: 12/06/2022    ECHOCARDIOGRAM REPORT   Patient Name:   LANGSTON KELTER Date of Exam: 12/05/2022 Medical Rec #:  [id number]           Height:       62.0 in Accession #:    [id number]          Weight:       116.6 lb Date of Birth:  [birth year]           BSA:          1.520 m Patient Age:    61 years            BP:           109/76 mmHg Patient Gender: F                   HR:           95 bpm. Exam Location:  Inpatient Procedure: 2D Echo, Cardiac Doppler and Color Doppler Indications:     CHF I50.9  History:         Patient has no prior history of Echocardiogram examinations.                  Arrythmias:Tachycardia; Risk Factors:Hypertension and Current                  Smoker.  Sonographer:     Lucendia Herrlich RCS Referring Phys:  [id number] Gillis Santa Diagnosing Phys: Lennie Odor MD IMPRESSIONS  1. Left ventricular ejection fraction, by estimation, is 60 to 65%. The left ventricle has normal function. The left ventricle has no regional wall motion abnormalities. Indeterminate diastolic filling due to E-A fusion.  2. Right ventricular systolic function is normal. The right ventricular size is normal.  3. The mitral valve is grossly normal. Trivial mitral valve regurgitation. No evidence of mitral stenosis.  4. The aortic valve is tricuspid. Aortic valve regurgitation is not visualized. No aortic stenosis is present. FINDINGS  Left Ventricle: Left ventricular ejection fraction, by estimation, is 60 to 65%. The left ventricle has normal function.  The left ventricle has no regional wall motion abnormalities. The left ventricular internal cavity size  was normal in size. There is  no left ventricular hypertrophy. Indeterminate diastolic filling due to E-A fusion. Right Ventricle: The right ventricular size is normal. No increase in right ventricular wall thickness. Right ventricular systolic function is normal. Left Atrium: Left atrial size was normal in size. Right Atrium: Right atrial size was normal in size. Pericardium: There is no evidence of pericardial effusion. Presence of epicardial fat layer. Mitral Valve: The mitral valve is grossly normal. Trivial mitral valve regurgitation. No evidence of mitral valve stenosis. Tricuspid Valve: The tricuspid valve is grossly normal. Tricuspid valve regurgitation is mild . No evidence of tricuspid stenosis. Aortic Valve: The aortic valve is tricuspid. Aortic valve regurgitation is not visualized. No aortic stenosis is present. Aortic valve peak gradient measures 4.0 mmHg. Pulmonic Valve: The pulmonic valve was grossly normal. Pulmonic valve regurgitation is trivial. No evidence of pulmonic stenosis. Aorta: The aortic root and ascending aorta are structurally normal, with no evidence of dilitation. Venous: The inferior vena cava was not well visualized. IAS/Shunts: The atrial septum is grossly normal.  LEFT VENTRICLE PLAX 2D LVIDd:         3.40 cm   Diastology LVIDs:         2.40 cm   LV e' medial:    5.33 cm/s LV PW:         0.90 cm   LV E/e' medial:  10.4 LV IVS:        0.90 cm   LV e' lateral:   7.29 cm/s LVOT diam:     1.70 cm   LV E/e' lateral: 7.6 LV SV:         27 LV SV Index:   18 LVOT Area:     2.27 cm  RIGHT VENTRICLE RV S prime:     14.70 cm/s TAPSE (M-mode): 1.8 cm LEFT ATRIUM             Index        RIGHT ATRIUM          Index LA diam:        2.40 cm 1.58 cm/m   RA Area:     9.92 cm LA Vol (A2C):   25.9 ml 17.04 ml/m  RA Volume:   19.00 ml 12.50 ml/m LA Vol (A4C):   26.7 ml 17.56 ml/m LA Biplane  Vol: 28.1 ml 18.49 ml/m  AORTIC VALVE AV Area (Vmax): 1.71 cm AV Vmax:        99.60 cm/s AV Peak Grad:   4.0 mmHg LVOT Vmax:      74.97 cm/s LVOT Vmean:     47.367 cm/s LVOT VTI:       0.121 m  AORTA Ao Root diam: 3.50 cm Ao Asc diam:  3.50 cm MITRAL VALVE               TRICUSPID VALVE MV Area (PHT): 4.91 cm    TR Peak grad:   31.1 mmHg MV Decel Time: 155 msec    TR Vmax:        279.00 cm/s MV E velocity: 55.55 cm/s MV A velocity: 93.55 cm/s  SHUNTS MV E/A ratio:  0.59        Systemic VTI:  0.12 m                            Systemic Diam: 1.70 cm Lennie Odor MD Electronically signed by Lennie Odor MD Signature Date/Time: 12/06/2022/11:20:27 AM  Final    DG Chest 1 View  Result Date: 12/01/2022 CLINICAL DATA:  Altered mental status with cough EXAM: CHEST  1 VIEW COMPARISON:  11/28/2022 FINDINGS: Interval streaky and hazy right infrahilar opacity. Stable cardiomediastinal silhouette with aortic atherosclerosis. No pneumothorax IMPRESSION: Interval streaky and hazy right infrahilar opacity, possible atelectasis or developing pneumonia. Electronically Signed   By: Jasmine Pang M.D.   On: 12/01/2022 22:42   CT ABDOMEN PELVIS W CONTRAST  Result Date: 11/28/2022 CLINICAL DATA:  Acute abdominal pain. Nausea and vomiting for 2 days. Hiatal hernia. EXAM: CT ABDOMEN AND PELVIS WITH CONTRAST TECHNIQUE: Multidetector CT imaging of the abdomen and pelvis was performed using the standard protocol following bolus administration of intravenous contrast. RADIATION DOSE REDUCTION: This exam was performed according to the departmental dose-optimization program which includes automated exposure control, adjustment of the mA and/or kV according to patient size and/or use of iterative reconstruction technique. CONTRAST:  75mL OMNIPAQUE IOHEXOL 300 MG/ML  SOLN COMPARISON:  04/10/2022 FINDINGS: Lower Chest: No acute findings. Hepatobiliary: No suspicious hepatic masses identified. Hepatomegaly and severe diffuse hepatic  steatosis is increased since previous study. Focal fatty sparing is seen in the left hepatic lobe. Prior cholecystectomy again noted. Extrahepatic biliary ductal dilatation remains stable. Pancreas:  No mass or inflammatory changes. Spleen: Within normal limits in size and appearance. Adrenals/Urinary Tract: No suspicious masses identified. Chronic severe left renal atrophy with dilated renal collecting system remains unchanged. No evidence of ureteral calculi or dilatation. Unremarkable unopacified urinary bladder. Stomach/Bowel: Large hiatal hernia is again seen but incompletely visualized on this exam. No evidence of obstruction, inflammatory process or abnormal fluid collections. Normal appendix visualized. Diverticulosis is seen mainly involving the descending and sigmoid colon, however there is no evidence of diverticulitis. Vascular/Lymphatic: No pathologically enlarged lymph nodes. No acute vascular findings. Reproductive:  No mass or other significant abnormality. Other:  None. Musculoskeletal:  No suspicious bone lesions identified. IMPRESSION: Increased hepatomegaly and severe diffuse hepatic steatosis. Large hiatal hernia. Stable chronic severe left renal atrophy. Colonic diverticulosis, without radiographic evidence of diverticulitis. Electronically Signed   By: Danae Orleans M.D.   On: 11/28/2022 11:14   DG Chest Portable 1 View  Result Date: 11/28/2022 CLINICAL DATA:  Shortness of breath and cough EXAM: PORTABLE CHEST 1 VIEW COMPARISON:  X-ray 12/12/2021 FINDINGS: Eventration of the right hemidiaphragm. No consolidation, pneumothorax or effusion. Normal cardiopericardial silhouette with a calcified aorta. No edema. Overlapping cardiac leads. Old bilateral rib fractures are seen. Moderate hiatal hernia. IMPRESSION: No acute cardiopulmonary disease. Hiatal hernia. Old rib fractures. Electronically Signed   By: Karen Kays M.D.   On: 11/28/2022 10:12    Microbiology: Recent Results (from the past  240 hour(s))  Urine Culture     Status: Abnormal   Collection Time: 11/28/22 10:51 AM   Specimen: Urine, Clean Catch  Result Value Ref Range Status   Specimen Description   Final    URINE, CLEAN CATCH Performed at [facility], [address], [place], [place] [address]    Special Requests   Final    NONE Performed at [facility], [address] [place], [place] [address]    Culture >=100,000 COLONIES/mL ESCHERICHIA COLI (A)  Final   Report Status 11/30/2022 FINAL  Final   Organism ID, Bacteria ESCHERICHIA COLI (A)  Final      Susceptibility   Escherichia coli - MIC*    AMPICILLIN <=2 SENSITIVE Sensitive     CEFAZOLIN <=4 SENSITIVE Sensitive  CEFEPIME <=0.12 SENSITIVE Sensitive     CEFTRIAXONE <=0.25 SENSITIVE Sensitive     CIPROFLOXACIN <=0.25 SENSITIVE Sensitive     GENTAMICIN <=1 SENSITIVE Sensitive     IMIPENEM <=0.25 SENSITIVE Sensitive     NITROFURANTOIN <=16 SENSITIVE Sensitive     TRIMETH/SULFA <=20 SENSITIVE Sensitive     AMPICILLIN/SULBACTAM <=2 SENSITIVE Sensitive     PIP/TAZO <=4 SENSITIVE Sensitive     * >=100,000 COLONIES/mL ESCHERICHIA COLI  SARS Coronavirus 2 by RT PCR (hospital order, performed in [facility] hospital lab) *cepheid single result test* Anterior Nasal Swab     Status: None   Collection Time: 11/28/22 12:32 PM   Specimen: Anterior Nasal Swab  Result Value Ref Range Status   SARS Coronavirus 2 by RT PCR NEGATIVE NEGATIVE Final    Comment: (NOTE) SARS-CoV-2 target nucleic acids are NOT DETECTED.  The SARS-CoV-2 RNA is generally detectable in upper and lower respiratory specimens during the acute phase of infection. The lowest concentration of SARS-CoV-2 viral copies this assay can detect is 250 copies / mL. A negative result does not preclude SARS-CoV-2 infection and should not be used as the sole basis for treatment or other patient management decisions.  A negative result may occur with improper specimen collection /  handling, submission of specimen other than nasopharyngeal swab, presence of viral mutation(s) within the areas targeted by this assay, and inadequate number of viral copies (<250 copies / mL). A negative result must be combined with clinical observations, patient history, and epidemiological information.  Fact Sheet for Patients:   RoadLapTop.co.za  Fact Sheet for Healthcare Providers: http://kim-miller.com/  This test is not yet approved or  cleared by the [place] FDA and has been authorized for detection and/or diagnosis of SARS-CoV-2 by FDA under an Emergency Use Authorization (EUA).  This EUA will remain in effect (meaning this test can be used) for the duration of the COVID-19 declaration under Section 564(b)(1) of the Act, 21 U.S.C. section 360bbb-3(b)(1), unless the authorization is terminated or revoked sooner.  Performed at [facility], [address] [place], [place] [address]   Culture, blood (routine x 2) Call MD if unable to obtain prior to antibiotics being given     Status: None   Collection Time: 12/02/22  3:30 AM   Specimen: BLOOD RIGHT ARM  Result Value Ref Range Status   Specimen Description BLOOD RIGHT ARM  Final   Special Requests   Final    BOTTLES DRAWN AEROBIC AND ANAEROBIC Blood Culture results may not be optimal due to an inadequate volume of blood received in culture bottles   Culture   Final    NO GROWTH 5 DAYS Performed at [facility], [address] [place], [place] [address]    Report Status 12/07/2022 FINAL  Final  Culture, blood (routine x 2) Call MD if unable to obtain prior to antibiotics being given     Status: None   Collection Time: 12/02/22  3:32 AM   Specimen: BLOOD  Result Value Ref Range Status   Specimen Description BLOOD right wrist  Final   Special Requests   Final    BOTTLES DRAWN AEROBIC AND ANAEROBIC Blood Culture results may not be optimal due to an  inadequate volume of blood received in culture bottles   Culture   Final    NO GROWTH 5 DAYS Performed at [facility], [address] [place], [place] [address]    Report Status 12/07/2022 FINAL  Final  Labs: CBC: Recent Labs  Lab 12/03/22 0403 12/04/22 0551 12/05/22 0655 12/06/22 0542 12/07/22 0438  WBC 4.8 5.4 5.1 5.4 5.1  HGB 8.3* 8.5* 8.4* 8.9* 8.9*  HCT 24.1* 25.1* 25.1* 26.9* 26.8*  MCV 100.4* 102.9* 104.6* 104.3* 104.3*  PLT 158 199 220 277 293   Basic Metabolic Panel: Recent Labs  Lab 12/02/22 0330 12/03/22 0403 12/04/22 0551 12/05/22 0655 12/06/22 0542 12/07/22 0438  NA 135 130* 136 134* 138 137  K 3.9 3.6 3.5 3.8 3.9 3.7  CL 104 102 105 104 105 104  CO2 20* 21* 22 22 25 23   GLUCOSE 114* 119* 113* 100* 108* 90  BUN <5* <5* <5* <5* <5* <5*  CREATININE 0.45 0.44 0.41* 0.43* 0.52 0.42*  CALCIUM 7.1* 7.0* 7.3* 7.3* 7.9* 8.0*  MG 1.4* 1.8 1.6* 1.9  --  1.9  PHOS 3.1 2.7 2.9 3.5  --  5.2*   Liver Function Tests: No results for input(s): "AST", "ALT", "ALKPHOS", "BILITOT", "PROT", "ALBUMIN" in the last 168 hours. No results for input(s): "LIPASE", "AMYLASE" in the last 168 hours. No results for input(s): "AMMONIA" in the last 168 hours. Cardiac Enzymes: No results for input(s): "CKTOTAL", "CKMB", "CKMBINDEX", "TROPONINI" in the last 168 hours. BNP (last 3 results) No results for input(s): "BNP" in the last 8760 hours. CBG: Recent Labs  Lab 12/01/22 2007  GLUCAP 139*    Time spent: 35 minutes  Signed:  Gillis Santa  [facility] 12/07/2022 12:57 PM

## 2022-12-07 NOTE — Telephone Encounter (Signed)
Kathy [name] is not the pt that I see on her DPR but shouldn't the hospital have discharged her with a current list of medications that she should be taking.

## 2022-12-08 DIAGNOSIS — F1090 Alcohol use, unspecified, uncomplicated: Secondary | ICD-10-CM | POA: Diagnosis not present

## 2022-12-08 DIAGNOSIS — I959 Hypotension, unspecified: Secondary | ICD-10-CM | POA: Diagnosis not present

## 2022-12-08 DIAGNOSIS — D510 Vitamin B12 deficiency anemia due to intrinsic factor deficiency: Secondary | ICD-10-CM | POA: Diagnosis not present

## 2022-12-08 DIAGNOSIS — G621 Alcoholic polyneuropathy: Secondary | ICD-10-CM | POA: Diagnosis not present

## 2022-12-08 DIAGNOSIS — J189 Pneumonia, unspecified organism: Secondary | ICD-10-CM | POA: Diagnosis not present

## 2022-12-08 DIAGNOSIS — K2920 Alcoholic gastritis without bleeding: Secondary | ICD-10-CM | POA: Diagnosis not present

## 2022-12-09 DIAGNOSIS — E878 Other disorders of electrolyte and fluid balance, not elsewhere classified: Secondary | ICD-10-CM | POA: Diagnosis not present

## 2022-12-09 DIAGNOSIS — D510 Vitamin B12 deficiency anemia due to intrinsic factor deficiency: Secondary | ICD-10-CM | POA: Diagnosis not present

## 2022-12-09 DIAGNOSIS — K2920 Alcoholic gastritis without bleeding: Secondary | ICD-10-CM | POA: Diagnosis not present

## 2022-12-09 DIAGNOSIS — I959 Hypotension, unspecified: Secondary | ICD-10-CM | POA: Diagnosis not present

## 2022-12-09 DIAGNOSIS — N390 Urinary tract infection, site not specified: Secondary | ICD-10-CM | POA: Diagnosis not present

## 2022-12-09 DIAGNOSIS — R112 Nausea with vomiting, unspecified: Secondary | ICD-10-CM | POA: Diagnosis not present

## 2022-12-09 DIAGNOSIS — J189 Pneumonia, unspecified organism: Secondary | ICD-10-CM | POA: Diagnosis not present

## 2022-12-09 DIAGNOSIS — F10930 Alcohol use, unspecified with withdrawal, uncomplicated: Secondary | ICD-10-CM | POA: Diagnosis not present

## 2022-12-10 DIAGNOSIS — G621 Alcoholic polyneuropathy: Secondary | ICD-10-CM | POA: Diagnosis not present

## 2022-12-10 DIAGNOSIS — F1090 Alcohol use, unspecified, uncomplicated: Secondary | ICD-10-CM | POA: Diagnosis not present

## 2022-12-10 DIAGNOSIS — I959 Hypotension, unspecified: Secondary | ICD-10-CM | POA: Diagnosis not present

## 2022-12-10 DIAGNOSIS — J189 Pneumonia, unspecified organism: Secondary | ICD-10-CM | POA: Diagnosis not present

## 2022-12-10 DIAGNOSIS — K2920 Alcoholic gastritis without bleeding: Secondary | ICD-10-CM | POA: Diagnosis not present

## 2022-12-18 ENCOUNTER — Ambulatory Visit (INDEPENDENT_AMBULATORY_CARE_PROVIDER_SITE_OTHER): Payer: Medicare HMO | Admitting: Internal Medicine

## 2022-12-18 ENCOUNTER — Encounter: Payer: Self-pay | Admitting: Internal Medicine

## 2022-12-18 VITALS — BP 119/80 | HR 92 | Temp 98.1°F | Ht 62.0 in | Wt 108.0 lb

## 2022-12-18 DIAGNOSIS — K7010 Alcoholic hepatitis without ascites: Secondary | ICD-10-CM | POA: Diagnosis not present

## 2022-12-18 DIAGNOSIS — R2689 Other abnormalities of gait and mobility: Secondary | ICD-10-CM

## 2022-12-18 DIAGNOSIS — K591 Functional diarrhea: Secondary | ICD-10-CM

## 2022-12-18 DIAGNOSIS — M25512 Pain in left shoulder: Secondary | ICD-10-CM

## 2022-12-18 DIAGNOSIS — E538 Deficiency of other specified B group vitamins: Secondary | ICD-10-CM

## 2022-12-18 DIAGNOSIS — E441 Mild protein-calorie malnutrition: Secondary | ICD-10-CM

## 2022-12-18 DIAGNOSIS — E876 Hypokalemia: Secondary | ICD-10-CM

## 2022-12-18 DIAGNOSIS — Z09 Encounter for follow-up examination after completed treatment for conditions other than malignant neoplasm: Secondary | ICD-10-CM

## 2022-12-18 LAB — CBC WITH DIFFERENTIAL/PLATELET
Basophils Absolute: 0 10*3/uL (ref 0.0–0.1)
Basophils Relative: 0.7 % (ref 0.0–3.0)
Eosinophils Absolute: 0.1 10*3/uL (ref 0.0–0.7)
Eosinophils Relative: 1.5 % (ref 0.0–5.0)
HCT: 32.8 % — ABNORMAL LOW (ref 36.0–46.0)
Hemoglobin: 10.4 g/dL — ABNORMAL LOW (ref 12.0–15.0)
Lymphocytes Relative: 39.9 % (ref 12.0–46.0)
Lymphs Abs: 2.6 10*3/uL (ref 0.7–4.0)
MCHC: 31.7 g/dL (ref 30.0–36.0)
MCV: 107.4 fL — ABNORMAL HIGH (ref 78.0–100.0)
Monocytes Absolute: 0.5 10*3/uL (ref 0.1–1.0)
Monocytes Relative: 8.1 % (ref 3.0–12.0)
Neutro Abs: 3.2 10*3/uL (ref 1.4–7.7)
Neutrophils Relative %: 49.8 % (ref 43.0–77.0)
Platelets: 373 10*3/uL (ref 150.0–400.0)
RBC: 3.05 Mil/uL — ABNORMAL LOW (ref 3.87–5.11)
RDW: 15.7 % — ABNORMAL HIGH (ref 11.5–15.5)
WBC: 6.4 10*3/uL (ref 4.0–10.5)

## 2022-12-18 LAB — COMPREHENSIVE METABOLIC PANEL
ALT: 12 U/L (ref 0–35)
AST: 44 U/L — ABNORMAL HIGH (ref 0–37)
Albumin: 2.9 g/dL — ABNORMAL LOW (ref 3.5–5.2)
Alkaline Phosphatase: 95 U/L (ref 39–117)
BUN: 2 mg/dL — ABNORMAL LOW (ref 6–23)
CO2: 25 meq/L (ref 19–32)
Calcium: 8.2 mg/dL — ABNORMAL LOW (ref 8.4–10.5)
Chloride: 104 meq/L (ref 96–112)
Creatinine, Ser: 0.42 mg/dL (ref 0.40–1.20)
GFR: 105.9 mL/min (ref >60.00)
Glucose, Bld: 105 mg/dL — ABNORMAL HIGH (ref 70–99)
Potassium: 4.6 meq/L (ref 3.5–5.1)
Sodium: 136 meq/L (ref 135–145)
Total Bilirubin: 0.6 mg/dL (ref 0.2–1.2)
Total Protein: 6.1 g/dL (ref 6.0–8.3)

## 2022-12-18 LAB — B12 AND FOLATE PANEL
Folate: >24.2 ng/mL (ref >5.9)
Vitamin B-12: 1028 pg/mL — ABNORMAL HIGH (ref 211–911)

## 2022-12-18 LAB — MAGNESIUM: Magnesium: 1.6 mg/dL (ref 1.5–2.5)

## 2022-12-18 MED ORDER — CYANOCOBALAMIN 1000 MCG/ML IJ SOLN
1000.0000 ug | Freq: Once | INTRAMUSCULAR | Status: AC
Start: 2022-12-18 — End: 2022-12-18
  Administered 2022-12-18: 1000 ug via INTRAMUSCULAR

## 2022-12-18 NOTE — Patient Instructions (Addendum)
Your dizziness is due to low blood pressure  Start taking the midodrine three times daily   Check once daily in  the afternoon.    If 3 readings in a row are > 130 ,  you may stop the midodrine .  Continue  checking the bp daily if you stop the midodrine  and restart if your readings are < 110  Start taking 200 mg of magnesium daily with food.     CHANGE YOUR DIET TO BLAND !  LET YOUR STOMACH HEAL

## 2022-12-18 NOTE — Progress Notes (Unsigned)
Subjective:  Patient ID: Kathy [name], female    DOB: 1960-09-11  Age: 62 y.o. MRN: [id number]  CC: {There were no encounter diagnoses. (Refresh or delete this SmartLink)}   HPI Kathy [name] presents for  Chief Complaint  Patient presents with   Hospitalization Follow-up    And b12 injection    62 yr old female with ongoing alcohol abuse,  peripheral neuropathy , presents for hospital follow up . She was admitted on oct 5 with alcoholic gastritis, hypokalemia, and metabolic enceophalopathy secondary to alcohol withdrawal.  She required ativan per CIWA protocol,  PT for profound LE weakness  She was discharged on Oct 14 to short term rehab /skilled nursing,  and dc home on oct 20th.   with midodrine for management of orthostatic hypotension (5 mg tid ) ,b12/ folic acid and ensure for nutitional support . Unfortunately she hjas had episdoes of dizziness,  and  had a fall onto left shoulder  while  staying at rehab.  X ray was normal,  legs are feeling stronger walking to her room at her daughter's house.  Staying with multiple family members.    Has not been checking blood pressure since d/c but daughter has purchased  Underweight:  nadir of 99 lbs in July   Taking 800mg  three times daily for neuropathy   needs PT for balance and strength    Las b12 injection 2 weeks ago.  Due now   Has been having loose stools daily despite being alcohol abstinent .  Currently    Outpatient Medications Prior to Visit  Medication Sig Dispense Refill   calcium carbonate (OS-CAL - DOSED IN MG OF ELEMENTAL CALCIUM) 1250 (500 Ca) MG tablet Take 1 tablet (1,250 mg total) by mouth daily with breakfast.     feeding supplement (ENSURE ENLIVE / ENSURE PLUS) LIQD Take 237 mLs by mouth 3 (three) times daily between meals.     folic acid (FOLVITE) 1 MG tablet Take 1 tablet (1 mg total) by mouth daily.     gabapentin (NEURONTIN) 800 MG tablet Take 800 mg by mouth 3 (three) times daily.      midodrine (PROAMATINE) 5 MG tablet Take 1 tablet (5 mg total) by mouth 3 (three) times daily with meals. Hold if SBP >110     Multiple Vitamin (MULTIVITAMIN WITH MINERALS) TABS tablet Take 1 tablet by mouth daily.     omeprazole (PRILOSEC) 40 MG capsule TAKE ONE CAPSULE BY MOUTH TWO TIMES DAILY 180 capsule 1   ondansetron (ZOFRAN-ODT) 4 MG disintegrating tablet Take 4 mg by mouth every 8 (eight) hours as needed.     thiamine (VITAMIN B-1) 100 MG tablet Take 1 tablet (100 mg total) by mouth daily.     gabapentin (NEURONTIN) 400 MG capsule TAKE 2 CAPSULES (800 MG) BY MOUTH 3 TIMES DAILY 180 capsule 11   ondansetron (ZOFRAN-ODT) 8 MG disintegrating tablet TAKE ONE TABLET UNDER THE TONGUE EVERY 8HOURS AS NEEDED FOR NAUSEA AND VOMITING. 20 tablet 0   guaiFENesin-dextromethorphan (ROBITUSSIN DM) 100-10 MG/5ML syrup Take 5 mLs by mouth every 4 (four) hours as needed for cough.     Syringe/Needle, Disp, (SYRINGE 3CC/25GX1") 25G X 1" 3 ML MISC Use for b12 injections 50 each 0   No facility-administered medications prior to visit.    Review of Systems;  Patient denies headache, fevers, malaise, unintentional weight loss, skin rash, eye pain, sinus congestion and sinus pain, sore throat, dysphagia,  hemoptysis , cough, dyspnea, wheezing, chest pain,  palpitations, orthopnea, edema, abdominal pain, nausea, melena, diarrhea, constipation, flank pain, dysuria, hematuria, urinary  Frequency, nocturia, numbness, tingling, seizures,  Focal weakness, Loss of consciousness,  Tremor, insomnia, depression, anxiety, and suicidal ideation.      Objective:  BP 119/80   Pulse 92   Temp 98.1 F (36.7 C) (Oral)   Ht 5\' 2"  (1.575 m)   Wt 108 lb (49 kg)   SpO2 97%   BMI 19.75 kg/m   BP Readings from Last 3 Encounters:  12/18/22 119/80  12/07/22 128/74  08/31/22 113/81    Wt Readings from Last 3 Encounters:  12/18/22 108 lb (49 kg)  12/07/22 112 lb 3.4 oz (50.9 kg)  09/29/22 109 lb (49.4 kg)    Physical  Exam  Lab Results  Component Value Date   HGBA1C 4.7 05/28/2021   HGBA1C 5.4 10/03/2018   HGBA1C 5.9 02/21/2018    Lab Results  Component Value Date   CREATININE 0.42 (L) 12/07/2022   CREATININE 0.52 12/06/2022   CREATININE 0.43 (L) 12/05/2022    Lab Results  Component Value Date   WBC 5.1 12/07/2022   HGB 8.9 (L) 12/07/2022   HCT 26.8 (L) 12/07/2022   PLT 293 12/07/2022   GLUCOSE 90 12/07/2022   CHOL 187 10/03/2018   TRIG 86 10/03/2018   HDL 95 10/03/2018   LDLDIRECT 119.0 10/11/2014   LDLCALC 75 10/03/2018   ALT 26 11/29/2022   AST 130 (H) 11/29/2022   NA 137 12/07/2022   K 3.7 12/07/2022   CL 104 12/07/2022   CREATININE 0.42 (L) 12/07/2022   BUN <5 (L) 12/07/2022   CO2 23 12/07/2022   TSH 2.75 07/09/2022   INR 1.0 07/09/2022   HGBA1C 4.7 05/28/2021    ECHOCARDIOGRAM COMPLETE  Result Date: 12/06/2022    ECHOCARDIOGRAM REPORT   Patient Name:   Kathy [name] Date of Exam: 12/05/2022 Medical Rec #:  [id number]           Height:       62.0 in Accession #:    [id number]          Weight:       116.6 lb Date of Birth:  Jun 28, 1960           BSA:          1.520 m Patient Age:    61 years            BP:           109/76 mmHg Patient Gender: F                   HR:           95 bpm. Exam Location:  Inpatient Procedure: 2D Echo, Cardiac Doppler and Color Doppler Indications:     CHF I50.9  History:         Patient has no prior history of Echocardiogram examinations.                  Arrythmias:Tachycardia; Risk Factors:Hypertension and Current                  Smoker.  Sonographer:     Lucendia Herrlich RCS Referring Phys:  [id number] Gillis Santa Diagnosing Phys: Lennie Odor MD IMPRESSIONS  1. Left ventricular ejection fraction, by estimation, is 60 to 65%. The left ventricle has normal function. The left ventricle has no regional wall motion abnormalities. Indeterminate diastolic filling due to E-A fusion.  2. Right ventricular  Subjective:  Patient ID: Kathy [name], female    DOB: 1960-09-11  Age: 62 y.o. MRN: [id number]  CC: {There were no encounter diagnoses. (Refresh or delete this SmartLink)}   HPI Kathy [name] presents for  Chief Complaint  Patient presents with   Hospitalization Follow-up    And b12 injection    62 yr old female with ongoing alcohol abuse,  peripheral neuropathy , presents for hospital follow up . She was admitted on oct 5 with alcoholic gastritis, hypokalemia, and metabolic enceophalopathy secondary to alcohol withdrawal.  She required ativan per CIWA protocol,  PT for profound LE weakness  She was discharged on Oct 14 to short term rehab /skilled nursing,  and dc home on oct 20th.   with midodrine for management of orthostatic hypotension (5 mg tid ) ,b12/ folic acid and ensure for nutitional support . Unfortunately she hjas had episdoes of dizziness,  and  had a fall onto left shoulder  while  staying at rehab.  X ray was normal,  legs are feeling stronger walking to her room at her daughter's house.  Staying with multiple family members.    Has not been checking blood pressure since d/c but daughter has purchased  Underweight:  nadir of 99 lbs in July   Taking 800mg  three times daily for neuropathy   needs PT for balance and strength    Las b12 injection 2 weeks ago.  Due now   Has been having loose stools daily despite being alcohol abstinent .  Currently    Outpatient Medications Prior to Visit  Medication Sig Dispense Refill   calcium carbonate (OS-CAL - DOSED IN MG OF ELEMENTAL CALCIUM) 1250 (500 Ca) MG tablet Take 1 tablet (1,250 mg total) by mouth daily with breakfast.     feeding supplement (ENSURE ENLIVE / ENSURE PLUS) LIQD Take 237 mLs by mouth 3 (three) times daily between meals.     folic acid (FOLVITE) 1 MG tablet Take 1 tablet (1 mg total) by mouth daily.     gabapentin (NEURONTIN) 800 MG tablet Take 800 mg by mouth 3 (three) times daily.      midodrine (PROAMATINE) 5 MG tablet Take 1 tablet (5 mg total) by mouth 3 (three) times daily with meals. Hold if SBP >110     Multiple Vitamin (MULTIVITAMIN WITH MINERALS) TABS tablet Take 1 tablet by mouth daily.     omeprazole (PRILOSEC) 40 MG capsule TAKE ONE CAPSULE BY MOUTH TWO TIMES DAILY 180 capsule 1   ondansetron (ZOFRAN-ODT) 4 MG disintegrating tablet Take 4 mg by mouth every 8 (eight) hours as needed.     thiamine (VITAMIN B-1) 100 MG tablet Take 1 tablet (100 mg total) by mouth daily.     gabapentin (NEURONTIN) 400 MG capsule TAKE 2 CAPSULES (800 MG) BY MOUTH 3 TIMES DAILY 180 capsule 11   ondansetron (ZOFRAN-ODT) 8 MG disintegrating tablet TAKE ONE TABLET UNDER THE TONGUE EVERY 8HOURS AS NEEDED FOR NAUSEA AND VOMITING. 20 tablet 0   guaiFENesin-dextromethorphan (ROBITUSSIN DM) 100-10 MG/5ML syrup Take 5 mLs by mouth every 4 (four) hours as needed for cough.     Syringe/Needle, Disp, (SYRINGE 3CC/25GX1") 25G X 1" 3 ML MISC Use for b12 injections 50 each 0   No facility-administered medications prior to visit.    Review of Systems;  Patient denies headache, fevers, malaise, unintentional weight loss, skin rash, eye pain, sinus congestion and sinus pain, sore throat, dysphagia,  hemoptysis , cough, dyspnea, wheezing, chest pain,  palpitations, orthopnea, edema, abdominal pain, nausea, melena, diarrhea, constipation, flank pain, dysuria, hematuria, urinary  Frequency, nocturia, numbness, tingling, seizures,  Focal weakness, Loss of consciousness,  Tremor, insomnia, depression, anxiety, and suicidal ideation.      Objective:  BP 119/80   Pulse 92   Temp 98.1 F (36.7 C) (Oral)   Ht 5\' 2"  (1.575 m)   Wt 108 lb (49 kg)   SpO2 97%   BMI 19.75 kg/m   BP Readings from Last 3 Encounters:  12/18/22 119/80  12/07/22 128/74  08/31/22 113/81    Wt Readings from Last 3 Encounters:  12/18/22 108 lb (49 kg)  12/07/22 112 lb 3.4 oz (50.9 kg)  09/29/22 109 lb (49.4 kg)    Physical  Exam  Lab Results  Component Value Date   HGBA1C 4.7 05/28/2021   HGBA1C 5.4 10/03/2018   HGBA1C 5.9 02/21/2018    Lab Results  Component Value Date   CREATININE 0.42 (L) 12/07/2022   CREATININE 0.52 12/06/2022   CREATININE 0.43 (L) 12/05/2022    Lab Results  Component Value Date   WBC 5.1 12/07/2022   HGB 8.9 (L) 12/07/2022   HCT 26.8 (L) 12/07/2022   PLT 293 12/07/2022   GLUCOSE 90 12/07/2022   CHOL 187 10/03/2018   TRIG 86 10/03/2018   HDL 95 10/03/2018   LDLDIRECT 119.0 10/11/2014   LDLCALC 75 10/03/2018   ALT 26 11/29/2022   AST 130 (H) 11/29/2022   NA 137 12/07/2022   K 3.7 12/07/2022   CL 104 12/07/2022   CREATININE 0.42 (L) 12/07/2022   BUN <5 (L) 12/07/2022   CO2 23 12/07/2022   TSH 2.75 07/09/2022   INR 1.0 07/09/2022   HGBA1C 4.7 05/28/2021    ECHOCARDIOGRAM COMPLETE  Result Date: 12/06/2022    ECHOCARDIOGRAM REPORT   Patient Name:   Kathy [name] Date of Exam: 12/05/2022 Medical Rec #:  [id number]           Height:       62.0 in Accession #:    [id number]          Weight:       116.6 lb Date of Birth:  Jun 28, 1960           BSA:          1.520 m Patient Age:    61 years            BP:           109/76 mmHg Patient Gender: F                   HR:           95 bpm. Exam Location:  Inpatient Procedure: 2D Echo, Cardiac Doppler and Color Doppler Indications:     CHF I50.9  History:         Patient has no prior history of Echocardiogram examinations.                  Arrythmias:Tachycardia; Risk Factors:Hypertension and Current                  Smoker.  Sonographer:     Lucendia Herrlich RCS Referring Phys:  [id number] Gillis Santa Diagnosing Phys: Lennie Odor MD IMPRESSIONS  1. Left ventricular ejection fraction, by estimation, is 60 to 65%. The left ventricle has normal function. The left ventricle has no regional wall motion abnormalities. Indeterminate diastolic filling due to E-A fusion.  2. Right ventricular

## 2022-12-20 DIAGNOSIS — Z09 Encounter for follow-up examination after completed treatment for conditions other than malignant neoplasm: Secondary | ICD-10-CM | POA: Insufficient documentation

## 2022-12-20 DIAGNOSIS — R197 Diarrhea, unspecified: Secondary | ICD-10-CM | POA: Insufficient documentation

## 2022-12-20 NOTE — Assessment & Plan Note (Addendum)
History suggesitve of malabsorption, possibly chronic  pancreatitis. Checking fecal elastase, other stool studies, and lytes.

## 2022-12-20 NOTE — Assessment & Plan Note (Signed)
Worsened with prolonged alcohol and gastritis.  Appetite improving with mirtazapine and gastritiis resolved

## 2022-12-20 NOTE — Assessment & Plan Note (Signed)
resolved 

## 2022-12-20 NOTE — Assessment & Plan Note (Signed)
Patient is stable post discharge and has no new issues or questions about discharge plans at the visit today for hospital follow up.  I have reviewed the records from the hospital admission in detail with patient today. 

## 2022-12-20 NOTE — Assessment & Plan Note (Signed)
Resolved with improved dietary intake.  [name]

## 2022-12-25 ENCOUNTER — Telehealth: Payer: Self-pay | Admitting: Internal Medicine

## 2022-12-25 NOTE — Telephone Encounter (Signed)
Patient called and said that the gabapentin (NEURONTIN) 800 MG tablet is too strong for her, she would like to go back to taking 400mg .

## 2022-12-25 NOTE — Telephone Encounter (Signed)
LMTCB. Need to find out if pt is having any kind of symptoms from the 800 mg Gabapentin or why she thinks it is too strong.

## 2022-12-29 ENCOUNTER — Telehealth: Payer: Self-pay

## 2022-12-29 NOTE — Telephone Encounter (Signed)
Patient states she would like to let Dr. Duncan Dull know that she went from 400 MG back to 800 MG gabapentin (NEURONTIN) tablet because her feet were hurting.  Patient states Dr. Duncan Dull wanted her to give a stool sample.  I let patient know that she will need to come by and pick up a kit, then bring the sample back to our office.  I spoke with Sandy Salaam, CMA, and she verified that we do have the lab order in for the stool sample.  Patient states she will call back to schedule a follow-up with Dr. Duncan Dull and a B12 injection.

## 2022-12-29 NOTE — Telephone Encounter (Signed)
Need to let pt know that she needs to drop off the stool studies at the [facility].

## 2022-12-29 NOTE — Telephone Encounter (Signed)
Pt called back and told Selena Batten that she has went back to taking the 800 mg because her feet started hurting again.

## 2022-12-29 NOTE — Addendum Note (Signed)
Addended by: Warden Fillers on: 12/29/2022 03:23 PM   Modules accepted: Orders

## 2022-12-31 ENCOUNTER — Telehealth: Payer: Self-pay

## 2022-12-31 NOTE — Telephone Encounter (Signed)
Pharmacy Patient Advocate Encounter  Received notification from [facility] that Prior Authorization for Prolia has been APPROVED from 02/24/23 to 02/23/24   PA #/Case ID/Reference #: [id number]  Approval letter indexed to media tab

## 2023-01-07 ENCOUNTER — Telehealth: Payer: Self-pay | Admitting: Internal Medicine

## 2023-01-07 NOTE — Telephone Encounter (Signed)
If okay I will complete and place in quick sign folder.

## 2023-01-07 NOTE — Telephone Encounter (Signed)
Patient is requesting a handicapp placard.

## 2023-01-11 NOTE — Telephone Encounter (Signed)
Placed sign folder.

## 2023-01-12 ENCOUNTER — Ambulatory Visit: Payer: Medicare HMO

## 2023-01-12 DIAGNOSIS — E538 Deficiency of other specified B group vitamins: Secondary | ICD-10-CM

## 2023-01-12 MED ORDER — CYANOCOBALAMIN 1000 MCG/ML IJ SOLN
1000.0000 ug | Freq: Once | INTRAMUSCULAR | Status: AC
Start: 2023-01-12 — End: 2023-01-12
  Administered 2023-01-12: 1000 ug via INTRAMUSCULAR

## 2023-01-12 NOTE — Progress Notes (Signed)
Patient presented for B 12 injection to left deltoid, patient voiced no concerns nor showed any signs of distress during injection. 

## 2023-01-15 DIAGNOSIS — L821 Other seborrheic keratosis: Secondary | ICD-10-CM | POA: Diagnosis not present

## 2023-01-15 DIAGNOSIS — D2261 Melanocytic nevi of right upper limb, including shoulder: Secondary | ICD-10-CM | POA: Diagnosis not present

## 2023-01-15 DIAGNOSIS — D485 Neoplasm of uncertain behavior of skin: Secondary | ICD-10-CM | POA: Diagnosis not present

## 2023-01-15 DIAGNOSIS — L2989 Other pruritus: Secondary | ICD-10-CM | POA: Diagnosis not present

## 2023-01-15 DIAGNOSIS — D2271 Melanocytic nevi of right lower limb, including hip: Secondary | ICD-10-CM | POA: Diagnosis not present

## 2023-01-15 DIAGNOSIS — D2262 Melanocytic nevi of left upper limb, including shoulder: Secondary | ICD-10-CM | POA: Diagnosis not present

## 2023-01-15 DIAGNOSIS — D2272 Melanocytic nevi of left lower limb, including hip: Secondary | ICD-10-CM | POA: Diagnosis not present

## 2023-01-15 DIAGNOSIS — D225 Melanocytic nevi of trunk: Secondary | ICD-10-CM | POA: Diagnosis not present

## 2023-01-15 DIAGNOSIS — L820 Inflamed seborrheic keratosis: Secondary | ICD-10-CM | POA: Diagnosis not present

## 2023-01-28 ENCOUNTER — Telehealth: Payer: Self-pay | Admitting: Internal Medicine
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
## 2023-01-28 NOTE — Telephone Encounter (Signed)
Patient called in and stated that she is itching all over but no hies or anything. She spoke with her dermatologist and they are wanting her to have labs done and sent over to them. She didn't know what labs they are wanting.

## 2023-01-29 ENCOUNTER — Ambulatory Visit (INDEPENDENT_AMBULATORY_CARE_PROVIDER_SITE_OTHER): Payer: Medicare HMO | Admitting: Family Medicine

## 2023-01-29 ENCOUNTER — Encounter: Payer: Self-pay | Admitting: Family Medicine

## 2023-01-29 VITALS — BP 126/76 | HR 70 | Temp 98.3°F | Ht 62.0 in | Wt 103.4 lb

## 2023-01-29 DIAGNOSIS — E538 Deficiency of other specified B group vitamins: Secondary | ICD-10-CM

## 2023-01-29 DIAGNOSIS — L299 Pruritus, unspecified: Secondary | ICD-10-CM | POA: Diagnosis not present

## 2023-01-29 LAB — COMPREHENSIVE METABOLIC PANEL
ALT: 25 U/L (ref 0–35)
AST: 36 U/L (ref 0–37)
Albumin: 3.9 g/dL (ref 3.5–5.2)
Alkaline Phosphatase: 58 U/L (ref 39–117)
BUN: 5 mg/dL — ABNORMAL LOW (ref 6–23)
CO2: 27 meq/L (ref 19–32)
Calcium: 9.1 mg/dL (ref 8.4–10.5)
Chloride: 100 meq/L (ref 96–112)
Creatinine, Ser: 0.51 mg/dL (ref 0.40–1.20)
GFR: 100.98 mL/min (ref >60.00)
Glucose, Bld: 107 mg/dL — ABNORMAL HIGH (ref 70–99)
Potassium: 4 meq/L (ref 3.5–5.1)
Sodium: 135 meq/L (ref 135–145)
Total Bilirubin: 0.5 mg/dL (ref 0.2–1.2)
Total Protein: 7.1 g/dL (ref 6.0–8.3)

## 2023-01-29 LAB — CBC WITH DIFFERENTIAL/PLATELET
Basophils Absolute: 0 10*3/uL (ref 0.0–0.1)
Basophils Relative: 0.6 % (ref 0.0–3.0)
Eosinophils Absolute: 0.1 10*3/uL (ref 0.0–0.7)
Eosinophils Relative: 1.5 % (ref 0.0–5.0)
HCT: 40.4 % (ref 36.0–46.0)
Hemoglobin: 14.2 g/dL (ref 12.0–15.0)
Lymphocytes Relative: 38.9 % (ref 12.0–46.0)
Lymphs Abs: 2.6 10*3/uL (ref 0.7–4.0)
MCHC: 35.2 g/dL (ref 30.0–36.0)
MCV: 102.4 fL — ABNORMAL HIGH (ref 78.0–100.0)
Monocytes Absolute: 0.7 10*3/uL (ref 0.1–1.0)
Monocytes Relative: 10.1 % (ref 3.0–12.0)
Neutro Abs: 3.3 10*3/uL (ref 1.4–7.7)
Neutrophils Relative %: 48.9 % (ref 43.0–77.0)
Platelets: 169 10*3/uL (ref 150.0–400.0)
RBC: 3.95 Mil/uL (ref 3.87–5.11)
RDW: 14.5 % (ref 11.5–15.5)
WBC: 6.7 10*3/uL (ref 4.0–10.5)

## 2023-01-29 LAB — TSH: TSH: 1.49 u[IU]/mL (ref 0.35–5.50)

## 2023-01-29 MED ORDER — CYANOCOBALAMIN 1000 MCG/ML IJ SOLN
1000.0000 ug | Freq: Once | INTRAMUSCULAR | Status: AC
  Administered 2023-01-29: 1000 ug via INTRAMUSCULAR

## 2023-01-29 NOTE — Progress Notes (Signed)
  Marikay Alar, MD Phone: [phone]  Kathy [name] is a 62 y.o. female who presents today for same day visit.   Itching: Patient reports itching that has been occurring for some time now that occurs in focal spots on her hands, arms, legs, and occasionally on her back.  Notes the areas turn red.  She notes no bumps or whelps.  She saw her dermatologist and they requested she have lab work.  They did advise her to start a topical cream for this.  She notes no fevers, night sweats, or unexplained weight loss.  Notes her symptoms typically occur at night.  Last alcohol use was last week.  She notes she has moved to a better environment.  Notes she was hospitalized for alcohol withdrawal in October.  Patient notes no new medications.  No new supplements.  Social History   Tobacco Use  Smoking Status Every Day   Current packs/day: 0.50   Average packs/day: 0.5 packs/day for 40.0 years (20.0 ttl pk-yrs)   Types: Cigarettes  Smokeless Tobacco Never  Tobacco Comments   Started smoking age 33.  Quit during pregnancies.    Current Outpatient Medications on File Prior to Visit  Medication Sig Dispense Refill   gabapentin (NEURONTIN) 800 MG tablet Take 800 mg by mouth 3 (three) times daily.     omeprazole (PRILOSEC) 40 MG capsule TAKE ONE CAPSULE BY MOUTH TWO TIMES DAILY 180 capsule 1   ondansetron (ZOFRAN-ODT) 4 MG disintegrating tablet Take 4 mg by mouth every 8 (eight) hours as needed.     No current facility-administered medications on file prior to visit.     ROS see history of present illness  Objective  Physical Exam Vitals:   01/29/23 1056  BP: 126/76  Pulse: 70  Temp: 98.3 F (36.8 C)  SpO2: 98%    BP Readings from Last 3 Encounters:  01/29/23 126/76  12/18/22 119/80  12/07/22 128/74   Wt Readings from Last 3 Encounters:  01/29/23 103 lb 6.4 oz (46.9 kg)  12/18/22 108 lb (49 kg)  12/07/22 112 lb 3.4 oz (50.9 kg)    Physical Exam Constitutional:       General: She is not in acute distress.    Appearance: She is not diaphoretic.  Cardiovascular:     Rate and Rhythm: Normal rate and regular rhythm.     Heart sounds: Normal heart sounds.  Pulmonary:     Effort: Pulmonary effort is normal.     Breath sounds: Normal breath sounds.  Skin:    General: Skin is warm and dry.     Comments: Right arm with slight erythema on the dorsum, has excoriations on right lower leg  Neurological:     Mental Status: She is alert.      Assessment/Plan: Please see individual problem list.  Itching Assessment & Plan: New onset issue.  Will check lab work as outlined.  Discussed potential causes.  If lab work is unremarkable she will follow-up with her dermatologist.  Plan to fax lab results to the dermatologist.  Orders: -     Comprehensive metabolic panel -     CBC with Differential/Platelet -     TSH  B12 deficiency -     Cyanocobalamin     Return if symptoms worsen or fail to improve.   Marikay Alar, MD [facility]

## 2023-01-29 NOTE — Assessment & Plan Note (Signed)
New onset issue.  Will check lab work as outlined.  Discussed potential causes.  If lab work is unremarkable she will follow-up with her dermatologist.  Plan to fax lab results to the dermatologist.

## 2023-02-04 ENCOUNTER — Ambulatory Visit: Payer: Medicare HMO | Admitting: Internal Medicine

## 2023-02-08 ENCOUNTER — Other Ambulatory Visit: Payer: Self-pay | Admitting: Internal Medicine

## 2023-02-08 DIAGNOSIS — M6281 Muscle weakness (generalized): Secondary | ICD-10-CM | POA: Diagnosis not present

## 2023-02-08 DIAGNOSIS — R2681 Unsteadiness on feet: Secondary | ICD-10-CM | POA: Diagnosis not present

## 2023-02-08 NOTE — Telephone Encounter (Signed)
Refilled: 10/14/2022  Last OV: 12/18/2022 Next OV: not scheduled

## 2023-02-09 ENCOUNTER — Ambulatory Visit: Payer: Medicare HMO

## 2023-02-09 ENCOUNTER — Telehealth: Payer: Self-pay | Admitting: Internal Medicine

## 2023-02-09 DIAGNOSIS — E538 Deficiency of other specified B group vitamins: Secondary | ICD-10-CM | POA: Diagnosis not present

## 2023-02-09 MED ORDER — CYANOCOBALAMIN 1000 MCG/ML IJ SOLN
1000.0000 ug | Freq: Once | INTRAMUSCULAR | Status: AC
  Administered 2023-02-09: 1000 ug via INTRAMUSCULAR

## 2023-02-09 NOTE — Telephone Encounter (Signed)
LMTCB

## 2023-02-09 NOTE — Progress Notes (Signed)
Patient presented for B 12 injection to left deltoid, patient voiced no concerns nor showed any signs of distress during injection. 

## 2023-02-09 NOTE — Telephone Encounter (Signed)
Pt called stating someone called her regarding a hematology appointment and she told her she would call her back. The pt does not know who she talked to

## 2023-02-10 ENCOUNTER — Encounter: Payer: Self-pay | Admitting: Family Medicine

## 2023-02-10 DIAGNOSIS — D7589 Other specified diseases of blood and blood-forming organs: Secondary | ICD-10-CM

## 2023-02-11 NOTE — Telephone Encounter (Signed)
Requesting referral to hematology

## 2023-02-11 NOTE — Telephone Encounter (Signed)
Referral placed to hematology for macrocytosis.

## 2023-02-12 ENCOUNTER — Telehealth: Payer: Self-pay

## 2023-02-12 NOTE — Telephone Encounter (Signed)
Copied from CRM [id number]. Topic: General - Other >> Feb 11, 2023 10:39 AM Florestine Avers wrote: Reason for CRM: patient is requesting recommendations to where she can go to see a hematologist as well as a referral. pt states that she is miserable and would like somebody from Dr. Melina Schools team to call her when they get a moment.

## 2023-02-12 NOTE — Telephone Encounter (Signed)
See other telephone encounter.

## 2023-02-12 NOTE — Telephone Encounter (Signed)
Spoke with pt and she stated that they contacted her and she has an appt scheduled.

## 2023-02-19 ENCOUNTER — Inpatient Hospital Stay: Payer: Medicare HMO | Attending: Internal Medicine | Admitting: Internal Medicine

## 2023-02-19 ENCOUNTER — Inpatient Hospital Stay: Payer: Medicare HMO

## 2023-02-19 ENCOUNTER — Encounter: Payer: Self-pay | Admitting: Internal Medicine

## 2023-02-19 VITALS — BP 120/79 | HR 93 | Temp 98.0°F | Resp 18 | Wt 105.2 lb

## 2023-02-19 DIAGNOSIS — K760 Fatty (change of) liver, not elsewhere classified: Secondary | ICD-10-CM

## 2023-02-19 DIAGNOSIS — Z8042 Family history of malignant neoplasm of prostate: Secondary | ICD-10-CM

## 2023-02-19 DIAGNOSIS — Z800 Family history of malignant neoplasm of digestive organs: Secondary | ICD-10-CM | POA: Diagnosis not present

## 2023-02-19 DIAGNOSIS — F17210 Nicotine dependence, cigarettes, uncomplicated: Secondary | ICD-10-CM | POA: Diagnosis not present

## 2023-02-19 DIAGNOSIS — D7589 Other specified diseases of blood and blood-forming organs: Secondary | ICD-10-CM | POA: Diagnosis not present

## 2023-02-19 DIAGNOSIS — L299 Pruritus, unspecified: Secondary | ICD-10-CM

## 2023-02-19 MED ORDER — HYDROXYZINE HCL 25 MG PO TABS: 25.0000 mg | ORAL_TABLET | Freq: Three times a day (TID) | ORAL | 0 refills | Status: DC | PRN

## 2023-02-19 MED ORDER — HYDROXYZINE HCL 10 MG PO TABS: 10.0000 mg | ORAL_TABLET | Freq: Two times a day (BID) | ORAL | 0 refills | Status: DC | PRN

## 2023-02-19 NOTE — Progress Notes (Signed)
[facility]  [phone] [phone]  ID: Kathy [name] OB: 1960/07/20  MR#: [id number]  AOZ#:[id number]  Patient Care Team: Sherlene Shams, MD as PCP - General (Internal Medicine) Michaelyn Barter, MD as Consulting Physician (Oncology)  REFERRING PROVIDER: Dr. Marikay Alar  REASON FOR REFERRAL: Macrocytosis  HPI: Kathy [name] is a 62 y.o. female with past medical history of chronic alcohol use, COPD, depression, referred to hematology for macrocytosis.  Patient has macrocytosis at least since 2016.  She was previously evaluated by Dr. Donneta Romberg in 2017 which was attributed to chronic alcohol use.  Patient is sober since Thanksgiving.  Previously, she had elevated hematocrit level and had JAK2 test which was negative.  Today she was accompanied with her daughter.  Her major concern today is itching.  There is no visible rash.  This is ongoing for past 2 months.  Reports she has seen dermatology with no obvious etiology identified.  Has not recently changed any detergent or soaps.  Does report feeling itchy with certain kind of foods.    REVIEW OF SYSTEMS:   ROS  As per HPI. Otherwise, a complete review of systems is negative.  PAST MEDICAL HISTORY: Past Medical History:  Diagnosis Date   Abdominal aortic atherosclerosis (HCC)    Abnormal weight loss    Alcohol abuse    Alcohol abuse with withdrawal (HCC) 10/13/2020   Allergic rhinitis    Anxiety    Bronchitis    Chicken pox    Colon polyps    COPD (chronic obstructive pulmonary disease) (HCC)    Depression    Dyspnea    Elevated lipase    Fatty liver    Gallbladder mass 09/09/2020   8 x 6 x 6 mm non mobile  mass along GB wall noted on 2018 [place] 8 x  7 x 5 mm non mobile mass along GB wall noted on July  2022 [place]   GERD (gastroesophageal reflux disease)    Hepatitis    Hernia of abdominal cavity    Hip fracture (HCC) right   Left upper quadrant abdominal pain     Neuromuscular disorder (HCC)    Neuropathy    lower legs   Thrombocytopenia (HCC)    Wears dentures    full upper and lower    PAST SURGICAL HISTORY: Past Surgical History:  Procedure Laterality Date   BIOPSY  08/31/2022   Procedure: BIOPSY;  Surgeon: Regis Bill, MD;  Location: [facility];  Service: Endoscopy;;   CATARACT EXTRACTION W/PHACO Right 07/29/2021   Procedure: CATARACT EXTRACTION PHACO AND INTRAOCULAR LENS PLACEMENT (IOC) RIGHT 19.12 01:27.4;  Surgeon: Galen Manila, MD;  Location: [facility];  Service: Ophthalmology;  Laterality: Right;   CATARACT EXTRACTION W/PHACO Left 08/12/2021   Procedure: CATARACT EXTRACTION PHACO AND INTRAOCULAR LENS PLACEMENT (IOC) LEFT 13.10 01:05.3;  Surgeon: Galen Manila, MD;  Location: [facility];  Service: Ophthalmology;  Laterality: Left;   CHOLECYSTECTOMY     COLONOSCOPY     COLONOSCOPY WITH PROPOFOL N/A 08/31/2022   Procedure: COLONOSCOPY WITH PROPOFOL;  Surgeon: Regis Bill, MD;  Location: [facility];  Service: Endoscopy;  Laterality: N/A;   ESOPHAGOGASTRODUODENOSCOPY (EGD) WITH PROPOFOL N/A 08/31/2022   Procedure: ESOPHAGOGASTRODUODENOSCOPY (EGD) WITH PROPOFOL;  Surgeon: Regis Bill, MD;  Location: [facility];  Service: Endoscopy;  Laterality: N/A;   EYE SURGERY     POLYPECTOMY  08/31/2022   Procedure: POLYPECTOMY;  Surgeon: Regis Bill, MD;  Location: [facility];  Service: Endoscopy;;   ROBOTIC ASSISTED LAPAROSCOPIC CHOLECYSTECTOMY  04/16/2021   [facility]    FAMILY HISTORY: Family History  Problem Relation Age of Onset   Hypertension Mother    Osteoporosis Mother    Meniere's disease Mother    Cancer Father 73       prostate   Colon cancer Paternal Grandmother    Alcohol abuse Other    Breast cancer Neg Hx     HEALTH MAINTENANCE: Social History   Tobacco Use   Smoking status: Every Day    Current packs/day: 0.50    Average packs/day: 0.5 packs/day for 40.0  years (20.0 ttl pk-yrs)    Types: Cigarettes   Smokeless tobacco: Never   Tobacco comments:    Started smoking age 52.  Quit during pregnancies.  Vaping Use   Vaping status: Never Used  Substance Use Topics   Alcohol use: Never    Alcohol/week: 42.0 standard drinks of alcohol    Types: 42 Glasses of wine per week    Comment: 07/23/21 - Pt reports "clean" for 5 yrs.  now drinks approx 6 drinks/wk   Drug use: No     Allergies  Allergen Reactions   Azithromycin Other (See Comments)   Buspirone Hives   Ciprofloxacin Hives   Lyrica [Pregabalin] Other (See Comments)    Blurred vision     Current Outpatient Medications  Medication Sig Dispense Refill   gabapentin (NEURONTIN) 800 MG tablet Take 800 mg by mouth 3 (three) times daily.     hydrOXYzine (ATARAX) 10 MG tablet Take 1 tablet (10 mg total) by mouth 2 (two) times daily as needed. 30 tablet 0   omeprazole (PRILOSEC) 40 MG capsule TAKE ONE CAPSULE BY MOUTH TWO TIMES DAILY 180 capsule 1   ondansetron (ZOFRAN-ODT) 8 MG disintegrating tablet TAKE 1 TABLET BY MOUTH EVERY 8 HOURS AS NEEDED FOR NAUSEA AND VOMITING 30 tablet 2   No current facility-administered medications for this visit.    OBJECTIVE: Vitals:   02/19/23 1402  BP: 120/79  Pulse: 93  Resp: 18  Temp: 98 F (36.7 C)  SpO2: 100%     Body mass index is 19.24 kg/m.      General: Well-developed, well-nourished, no acute distress. Eyes: Pink conjunctiva, anicteric sclera. HEENT: Normocephalic, moist mucous membranes, clear oropharnyx. Lungs: Clear to auscultation bilaterally. Heart: Regular rate and rhythm. No rubs, murmurs, or gallops. Abdomen: Soft, nontender, nondistended. No organomegaly noted, normoactive bowel sounds. Musculoskeletal: No edema, cyanosis, or clubbing. Neuro: Alert, answering all questions appropriately. Cranial nerves grossly intact. Skin: No rashes or petechiae noted. Psych: Normal affect. Lymphatics: No cervical, calvicular, axillary or  inguinal LAD.   LAB RESULTS:  Lab Results  Component Value Date   NA 135 01/29/2023   K 4.0 01/29/2023   CL 100 01/29/2023   CO2 27 01/29/2023   GLUCOSE 107 (H) 01/29/2023   BUN 5 (L) 01/29/2023   CREATININE 0.51 01/29/2023   CALCIUM 9.1 01/29/2023   PROT 7.1 01/29/2023   ALBUMIN 3.9 01/29/2023   AST 36 01/29/2023   ALT 25 01/29/2023   ALKPHOS 58 01/29/2023   BILITOT 0.5 01/29/2023   GFRNONAA >60 12/07/2022   GFRAA 112 10/03/2018    Lab Results  Component Value Date   WBC 6.7 01/29/2023   NEUTROABS 3.3 01/29/2023   HGB 14.2 01/29/2023   HCT 40.4 01/29/2023   MCV 102.4 (H) 01/29/2023   PLT 169.0 01/29/2023    Lab Results  Component Value Date   TIBC  157 (L) 11/28/2022   TIBC 356 12/30/2015   TIBC 343 04/22/2015   FERRITIN 354 (H) 11/28/2022   FERRITIN 384.1 (H) 04/22/2015   IRONPCTSAT 93 (H) 11/28/2022   IRONPCTSAT 21 12/30/2015   IRONPCTSAT 51 (H) 04/22/2015     STUDIES: No results found.  ASSESSMENT AND PLAN:   Kathy [name] is a 62 y.o. female with pmh of chronic alcohol use, COPD, depression, referred to hematology for macrocytosis.  # Macrocytosis -Present since 2016.  Likely secondary to chronic alcohol use.  CT abdomen pelvis from October 2024 showed diffuse hepatic steatosis. -Vitamin B12 and folate is normal.  TSH is normal.  No further workup is indicated.  She is not anemic.  Recent hemoglobin is 14.  WBC and platelets are normal.  # Pruritus -Present for about 2 months.  Has seen dermatology and no obvious etiology could be identified.  Discussed with the patient that her macrocytosis is unlikely to be related to itching.  CMP is unremarkable. -She does report itching with certain kind of food.  Advised about discussing this further with Dr. Darrick Huntsman if that we ruled for food allergy testing. -Sent prescription for Atarax 10 mg twice daily as needed for symptom relief.  Starting with a lower dose since it is cleared by her liver and with her  history of alcoholic liver disease the byproduct can increase the risk of drowsiness.  Can be escalated if she tolerates it.  RTC as needed  Patient expressed understanding and was in agreement with this plan. She also understands that She can call clinic at any time with any questions, concerns, or complaints.   I spent a total of 45 minutes reviewing chart data, face-to-face evaluation with the patient, counseling and coordination of care as detailed above.  Michaelyn Barter, MD   02/19/2023 3:12 PM

## 2023-02-25 DIAGNOSIS — M6281 Muscle weakness (generalized): Secondary | ICD-10-CM | POA: Diagnosis not present

## 2023-02-25 DIAGNOSIS — R2681 Unsteadiness on feet: Secondary | ICD-10-CM | POA: Diagnosis not present

## 2023-03-01 ENCOUNTER — Telehealth: Payer: Self-pay

## 2023-03-01 NOTE — Telephone Encounter (Signed)
 Copied from CRM [id number]. Topic: Referral - Request for Referral >> Mar 01, 2023 10:11 AM Maisie BROCKS wrote: Did the patient discuss referral with their provider in the last year? Yes (If No - schedule appointment) (If Yes - send message)  Appointment offered? Yes  Type of order/referral and detailed reason for visit: Neurologist   Preference of office, provider, location: Dr. Lane  If referral order, have you been seen by this specialty before? No  Pt called and stated that Dr. Marylynn originally sent a referral to a Hematologist to evaluate her skin issues. She explained that she is still having a skin itching and burning sensation, the medication that the hematologist prescribed works for the itching sometimes but not the pain. She described the pain as a stabbing needle pain. She also stated that the meds makes her sleepy and tired. Additionally, she explained that her hematologist suggested for her to be seen with an allergy specialist but she doesn't believe its that, she thinks its best to see a neurologist as she believes its a blood circulation issue. She also stated that she is an alcoholic but has not consumed any alcohol  for 2 1/2 months. I offered to schedule her with an appt today with pcp but she preferred a message to be sent back instead. Please call and advise patient regarding her request to see a neurologist.  Can we respond through MyChart? Yes

## 2023-03-01 NOTE — Telephone Encounter (Signed)
 Spoke with pt and scheduled an appt

## 2023-03-04 ENCOUNTER — Ambulatory Visit (INDEPENDENT_AMBULATORY_CARE_PROVIDER_SITE_OTHER): Payer: Medicare HMO

## 2023-03-04 DIAGNOSIS — E538 Deficiency of other specified B group vitamins: Secondary | ICD-10-CM | POA: Diagnosis not present

## 2023-03-04 DIAGNOSIS — R2681 Unsteadiness on feet: Secondary | ICD-10-CM | POA: Diagnosis not present

## 2023-03-04 DIAGNOSIS — M6281 Muscle weakness (generalized): Secondary | ICD-10-CM | POA: Diagnosis not present

## 2023-03-04 MED ORDER — CYANOCOBALAMIN 1000 MCG/ML IJ SOLN
1000.0000 ug | Freq: Once | INTRAMUSCULAR | Status: AC
  Administered 2023-03-04: 1000 ug via INTRAMUSCULAR

## 2023-03-04 NOTE — Progress Notes (Signed)
 Pt presented for their vitamin B12 injection. Pt was identified through two identifiers. Pt tolerated shot well in their right deltoid.

## 2023-03-12 ENCOUNTER — Ambulatory Visit: Payer: Medicare HMO | Admitting: Internal Medicine

## 2023-03-18 ENCOUNTER — Other Ambulatory Visit: Payer: Self-pay | Admitting: Internal Medicine

## 2023-03-18 ENCOUNTER — Ambulatory Visit: Payer: Medicare HMO | Admitting: Internal Medicine

## 2023-03-18 NOTE — Telephone Encounter (Signed)
Last OV 12/6, no future OV scheduled. Routing to provider for review/authorization.

## 2023-03-18 NOTE — Telephone Encounter (Signed)
Copied from CRM [id number]. Topic: Clinical - Medication Refill >> Mar 18, 2023 10:32 AM Joanette Gula wrote: Most Recent Primary Care Visit:  Provider: Donavan Foil  Department: [facility]  Visit Type: CLINICAL SUPPORT  Date: 03/04/2023  Medication: hydrOXYzine (ATARAX) 10 MG tablet  Has the patient contacted their pharmacy? Yes (Agent: If no, request that the patient contact the pharmacy for the refill. If patient does not wish to contact the pharmacy document the reason why and proceed with request.) (Agent: If yes, when and what did the pharmacy advise?)  Is this the correct pharmacy for this prescription? Yes If no, delete pharmacy and type the correct one.  This is the patient's preferred pharmacy:   [facility] - [place], [place] - [address] [place] [address] Phone: [phone] Fax: [phone]   Has the prescription been filled recently? Yes  Is the patient out of the medication? Yes  Has the patient been seen for an appointment in the last year OR does the patient have an upcoming appointment? Yes  Can we respond through MyChart? No  Agent: Please be advised that Rx refills may take up to 3 business days. We ask that you follow-up with your pharmacy.

## 2023-03-19 ENCOUNTER — Encounter: Payer: Self-pay | Admitting: Internal Medicine

## 2023-03-19 ENCOUNTER — Ambulatory Visit (INDEPENDENT_AMBULATORY_CARE_PROVIDER_SITE_OTHER): Payer: Medicare HMO | Admitting: Internal Medicine

## 2023-03-19 VITALS — BP 136/72 | HR 90 | Ht 62.0 in | Wt 108.8 lb

## 2023-03-19 DIAGNOSIS — E782 Mixed hyperlipidemia: Secondary | ICD-10-CM

## 2023-03-19 DIAGNOSIS — E559 Vitamin D deficiency, unspecified: Secondary | ICD-10-CM | POA: Diagnosis not present

## 2023-03-19 DIAGNOSIS — D696 Thrombocytopenia, unspecified: Secondary | ICD-10-CM | POA: Diagnosis not present

## 2023-03-19 DIAGNOSIS — E538 Deficiency of other specified B group vitamins: Secondary | ICD-10-CM

## 2023-03-19 DIAGNOSIS — L299 Pruritus, unspecified: Secondary | ICD-10-CM

## 2023-03-19 DIAGNOSIS — G621 Alcoholic polyneuropathy: Secondary | ICD-10-CM | POA: Diagnosis not present

## 2023-03-19 DIAGNOSIS — R0989 Other specified symptoms and signs involving the circulatory and respiratory systems: Secondary | ICD-10-CM | POA: Diagnosis not present

## 2023-03-19 DIAGNOSIS — K700 Alcoholic fatty liver: Secondary | ICD-10-CM | POA: Diagnosis not present

## 2023-03-19 DIAGNOSIS — R7303 Prediabetes: Secondary | ICD-10-CM

## 2023-03-19 DIAGNOSIS — F1024 Alcohol dependence with alcohol-induced mood disorder: Secondary | ICD-10-CM

## 2023-03-19 DIAGNOSIS — R7301 Impaired fasting glucose: Secondary | ICD-10-CM | POA: Diagnosis not present

## 2023-03-19 LAB — POCT GLYCOSYLATED HEMOGLOBIN (HGB A1C): Hemoglobin A1C: 6.3 % — AB (ref 4.0–5.6)

## 2023-03-19 MED ORDER — OMEPRAZOLE 40 MG PO CPDR: DELAYED_RELEASE_CAPSULE | ORAL | 1 refills | Status: DC

## 2023-03-19 MED ORDER — CYANOCOBALAMIN 1000 MCG/ML IJ SOLN
1000.0000 ug | Freq: Once | INTRAMUSCULAR | Status: AC
  Administered 2023-03-19: 1000 ug via INTRAMUSCULAR

## 2023-03-19 MED ORDER — TRAMADOL HCL 50 MG PO TABS: 50.0000 mg | ORAL_TABLET | Freq: Every day | ORAL | 2 refills | Status: DC

## 2023-03-19 MED ORDER — GABAPENTIN 400 MG PO CAPS: 800.0000 mg | ORAL_CAPSULE | Freq: Three times a day (TID) | ORAL | 2 refills | Status: DC

## 2023-03-19 MED ORDER — HYDROXYZINE HCL 10 MG PO TABS: 10.0000 mg | ORAL_TABLET | Freq: Two times a day (BID) | ORAL | 0 refills | Status: DC | PRN

## 2023-03-19 NOTE — Assessment & Plan Note (Signed)
Recurrent,  due to alcoholism . Repeat in 3 months  Lab Results  Component Value Date   WBC 6.7 01/29/2023   HGB 14.2 01/29/2023   HCT 40.4 01/29/2023   MCV 102.4 (H) 01/29/2023   PLT 169.0 01/29/2023

## 2023-03-19 NOTE — Assessment & Plan Note (Addendum)
A1c has risen to 6.3 with altered eating habits . Increase in sugary foods. Dietary counselling given.   Recheck in 3 months   Lab Results  Component Value Date   HGBA1C 6.3 (A) 03/19/2023   Lab Results  Component Value Date   NA 135 01/29/2023   K 4.0 01/29/2023   CL 100 01/29/2023   CO2 27 01/29/2023

## 2023-03-19 NOTE — Assessment & Plan Note (Signed)
Managed with gabapentin 400 mg dose 3 to 4 times daily.  Adding tramadol 50 mg at bedtime.

## 2023-03-19 NOTE — Patient Instructions (Addendum)
Your New regimen for control of  itching/pain:   Gabapentin 400 mg three times daily  50 mg tramadol in the evening ONLY   Hydroxyzine may be used once or twice  daily as needed for itching     2)  I am making a referral to [facility] to have your circulation evaluated    3) Osteoporosis :  Your Prolia injections are every 6  months and you are due in March.  Please have labs done one week prior to your Prolia injection.    4) Mammogram is due in August  5) Continue b12 injections .every 2 weeks

## 2023-03-19 NOTE — Assessment & Plan Note (Signed)
Cap refill is sluggish and pulses are barely palpable. Referring to [facility] for evaluation given history of tobacco abuse

## 2023-03-19 NOTE — Assessment & Plan Note (Signed)
Controlled with gabapentin and hydroxyzine

## 2023-03-19 NOTE — Assessment & Plan Note (Signed)
Mood has improved with adherence to sobriety and increased contact with family

## 2023-03-19 NOTE — Progress Notes (Unsigned)
Subjective:  Patient ID: Kathy [name], female    DOB: 12/01/1960  Age: 63 y.o. MRN: [id number]  CC: The primary encounter diagnosis was Decreased pulses in feet. Diagnoses of Itching, Alcoholic peripheral neuropathy (HCC), Impaired fasting glucose, Vitamin D deficiency, B12 deficiency, Fatty liver, alcoholic, Thrombocytopenia (HCC), Moderate mixed hyperlipidemia not requiring statin therapy, Alcohol dependence with alcohol-induced mood disorder (HCC), and Prediabetes were also pertinent to this visit.   HPI Kathy [name] presents for  Chief Complaint  Patient presents with  . Medical Management of Chronic Issues    Medication refill   Roanna Raider is a delightful 63 yr old female with a history of alcoholism complicated by painful neuropathy and malnutrition who is here for follow up . She IS  accompanied by her mother    1) alcoholism . She has been abstinent since Thanksgiving .  Her last relapse occurred in early November . Since moving out of her former residence she has been living with various family members (splitting time between mother and daughters).  , hanges every 4 days.  Removed herself from the toxic situation which caused a relapse.   2) essential hypertension: untreated currently,  as her home readings have been < 130/80 since achieving sobriety   3) Alcoholic polyneuropathy:  she has been taking  gabapentin 1200 to 1600 mg daily in divide doses..  she has continued episodes of nocturnal pain that is sporadic and not relived with 800 mg gabapentin  but responded to tramadol which she was prescribed in the past.  She is requesting to resume bedtime  tramadol for nerve pain.  She continues to benefit from biweekly b12 injections    3)  pruritus:  she was prescribed 10 mg atarax bid by oncology in late December after a non diagnostic dermatology evaluation for same with good results.  Currently using it once daily   4) Craving sweets:  she is requesting an A1c because  she has been indulging in sweets since quitting alcohol  5) tobaccoabuse:  smoking daily,  < 1 pack .    Lab Results  Component Value Date   HGBA1C 6.3 (A) 03/19/2023      Outpatient Medications Prior to Visit  Medication Sig Dispense Refill  . hydrOXYzine (ATARAX) 10 MG tablet Take 1 tablet (10 mg total) by mouth 2 (two) times daily as needed. 180 tablet 0  . ondansetron (ZOFRAN-ODT) 8 MG disintegrating tablet TAKE 1 TABLET BY MOUTH EVERY 8 HOURS AS NEEDED FOR NAUSEA AND VOMITING 30 tablet 2  . gabapentin (NEURONTIN) 400 MG capsule Take 800 mg by mouth 3 (three) times daily.    [name] omeprazole (PRILOSEC) 40 MG capsule TAKE ONE CAPSULE BY MOUTH TWO TIMES DAILY 180 capsule 1  . gabapentin (NEURONTIN) 800 MG tablet Take 800 mg by mouth 3 (three) times daily. (Patient not taking: Reported on 03/19/2023)     No facility-administered medications prior to visit.    Review of Systems;  Patient denies headache, fevers, malaise, unintentional weight loss, skin rash, eye pain, sinus congestion and sinus pain, sore throat, dysphagia,  hemoptysis , cough, dyspnea, wheezing, chest pain, palpitations, orthopnea, edema, abdominal pain, nausea, melena, diarrhea, constipation, flank pain, dysuria, hematuria, urinary  Frequency, nocturia, numbness, tingling, seizures,  Focal weakness, Loss of consciousness,  Tremor, insomnia, depression, anxiety, and suicidal ideation.      Objective:  BP 136/72   Pulse 90   Ht 5\' 2"  (1.575 m)   Wt 108 lb 12.8 oz (49.4 kg)  SpO2 96%   BMI 19.90 kg/m   BP Readings from Last 3 Encounters:  03/19/23 136/72  02/19/23 120/79  01/29/23 126/76    Wt Readings from Last 3 Encounters:  03/19/23 108 lb 12.8 oz (49.4 kg)  02/19/23 105 lb 3.2 oz (47.7 kg)  01/29/23 103 lb 6.4 oz (46.9 kg)    Physical Exam Vitals reviewed.  Constitutional:      General: She is not in acute distress.    Appearance: Normal appearance. She is normal weight. She is not ill-appearing,  toxic-appearing or diaphoretic.  HENT:     Head: Normocephalic.  Eyes:     General: No scleral icterus.       Right eye: No discharge.        Left eye: No discharge.     Conjunctiva/sclera: Conjunctivae normal.  Cardiovascular:     Rate and Rhythm: Normal rate and regular rhythm.     Pulses:          Dorsalis pedis pulses are 0 on the right side and 0 on the left side.     Heart sounds: Normal heart sounds.  Pulmonary:     Effort: Pulmonary effort is normal. No respiratory distress.     Breath sounds: Normal breath sounds.  Abdominal:     General: Abdomen is flat.     Palpations: Abdomen is soft.  Musculoskeletal:        General: Normal range of motion.     Right lower leg: No edema.     Left lower leg: No edema.  Feet:     Comments: Feet cold,  cap refill > 3 sec  Skin:    General: Skin is warm and dry.  Neurological:     General: No focal deficit present.     Mental Status: She is alert and oriented to person, place, and time. Mental status is at baseline.  Psychiatric:        Mood and Affect: Mood normal.        Behavior: Behavior normal.        Thought Content: Thought content normal.        Judgment: Judgment normal.   Lab Results  Component Value Date   HGBA1C 6.3 (A) 03/19/2023   HGBA1C 4.7 05/28/2021   HGBA1C 5.4 10/03/2018    Lab Results  Component Value Date   CREATININE 0.51 01/29/2023   CREATININE 0.42 12/18/2022   CREATININE 0.42 (L) 12/07/2022    Lab Results  Component Value Date   WBC 6.7 01/29/2023   HGB 14.2 01/29/2023   HCT 40.4 01/29/2023   PLT 169.0 01/29/2023   GLUCOSE 107 (H) 01/29/2023   CHOL 187 10/03/2018   TRIG 86 10/03/2018   HDL 95 10/03/2018   LDLDIRECT 119.0 10/11/2014   LDLCALC 75 10/03/2018   ALT 25 01/29/2023   AST 36 01/29/2023   NA 135 01/29/2023   K 4.0 01/29/2023   CL 100 01/29/2023   CREATININE 0.51 01/29/2023   BUN 5 (L) 01/29/2023   CO2 27 01/29/2023   TSH 1.49 01/29/2023   INR 1.0 07/09/2022   HGBA1C 6.3  (A) 03/19/2023    ECHOCARDIOGRAM COMPLETE Result Date: 12/06/2022    ECHOCARDIOGRAM REPORT   Patient Name:   TRAN RANDLE Date of Exam: 12/05/2022 Medical Rec #:  [id number]           Height:       62.0 in Accession #:    [id number]  Weight:       116.6 lb Date of Birth:  05-27-1960           BSA:          1.520 m Patient Age:    61 years            BP:           109/76 mmHg Patient Gender: F                   HR:           95 bpm. Exam Location:  Inpatient Procedure: 2D Echo, Cardiac Doppler and Color Doppler Indications:     CHF I50.9  History:         Patient has no prior history of Echocardiogram examinations.                  Arrythmias:Tachycardia; Risk Factors:Hypertension and Current                  Smoker.  Sonographer:     Lucendia Herrlich RCS Referring Phys:  [id number] Gillis Santa Diagnosing Phys: Lennie Odor MD IMPRESSIONS  1. Left ventricular ejection fraction, by estimation, is 60 to 65%. The left ventricle has normal function. The left ventricle has no regional wall motion abnormalities. Indeterminate diastolic filling due to E-A fusion.  2. Right ventricular systolic function is normal. The right ventricular size is normal.  3. The mitral valve is grossly normal. Trivial mitral valve regurgitation. No evidence of mitral stenosis.  4. The aortic valve is tricuspid. Aortic valve regurgitation is not visualized. No aortic stenosis is present. FINDINGS  Left Ventricle: Left ventricular ejection fraction, by estimation, is 60 to 65%. The left ventricle has normal function. The left ventricle has no regional wall motion abnormalities. The left ventricular internal cavity size was normal in size. There is  no left ventricular hypertrophy. Indeterminate diastolic filling due to E-A fusion. Right Ventricle: The right ventricular size is normal. No increase in right ventricular wall thickness. Right ventricular systolic function is normal. Left Atrium: Left atrial size was normal in size.  Right Atrium: Right atrial size was normal in size. Pericardium: There is no evidence of pericardial effusion. Presence of epicardial fat layer. Mitral Valve: The mitral valve is grossly normal. Trivial mitral valve regurgitation. No evidence of mitral valve stenosis. Tricuspid Valve: The tricuspid valve is grossly normal. Tricuspid valve regurgitation is mild . No evidence of tricuspid stenosis. Aortic Valve: The aortic valve is tricuspid. Aortic valve regurgitation is not visualized. No aortic stenosis is present. Aortic valve peak gradient measures 4.0 mmHg. Pulmonic Valve: The pulmonic valve was grossly normal. Pulmonic valve regurgitation is trivial. No evidence of pulmonic stenosis. Aorta: The aortic root and ascending aorta are structurally normal, with no evidence of dilitation. Venous: The inferior vena cava was not well visualized. IAS/Shunts: The atrial septum is grossly normal.  LEFT VENTRICLE PLAX 2D LVIDd:         3.40 cm   Diastology LVIDs:         2.40 cm   LV e' medial:    5.33 cm/s LV PW:         0.90 cm   LV E/e' medial:  10.4 LV IVS:        0.90 cm   LV e' lateral:   7.29 cm/s LVOT diam:     1.70 cm   LV E/e' lateral: 7.6 LV SV:  27 LV SV Index:   18 LVOT Area:     2.27 cm  RIGHT VENTRICLE RV S prime:     14.70 cm/s TAPSE (M-mode): 1.8 cm LEFT ATRIUM             Index        RIGHT ATRIUM          Index LA diam:        2.40 cm 1.58 cm/m   RA Area:     9.92 cm LA Vol (A2C):   25.9 ml 17.04 ml/m  RA Volume:   19.00 ml 12.50 ml/m LA Vol (A4C):   26.7 ml 17.56 ml/m LA Biplane Vol: 28.1 ml 18.49 ml/m  AORTIC VALVE AV Area (Vmax): 1.71 cm AV Vmax:        99.60 cm/s AV Peak Grad:   4.0 mmHg LVOT Vmax:      74.97 cm/s LVOT Vmean:     47.367 cm/s LVOT VTI:       0.121 m  AORTA Ao Root diam: 3.50 cm Ao Asc diam:  3.50 cm MITRAL VALVE               TRICUSPID VALVE MV Area (PHT): 4.91 cm    TR Peak grad:   31.1 mmHg MV Decel Time: 155 msec    TR Vmax:        279.00 cm/s MV E velocity: 55.55  cm/s MV A velocity: 93.55 cm/s  SHUNTS MV E/A ratio:  0.59        Systemic VTI:  0.12 m                            Systemic Diam: 1.70 cm Lennie Odor MD Electronically signed by Lennie Odor MD Signature Date/Time: 12/06/2022/11:20:27 AM    Final     Assessment & Plan:  .Decreased pulses in feet Assessment & Plan: Cap refill is sluggish and pulses are barely palpable. Referring to [facility] for evaluation given history of tobacco abuse  Orders: -     Ambulatory referral to Vascular Surgery  Itching Assessment & Plan: Controlled with gabapentin and hydroxyzine    Alcoholic peripheral neuropathy (HCC) Assessment & Plan: Managed with gabapentin 400 mg dose 3 to 4 times daily.  Adding tramadol 50 mg at bedtime.   Orders: -     traMADol HCl; Take 1 tablet (50 mg total) by mouth at bedtime.  Dispense: 30 tablet; Refill: 2  Impaired fasting glucose -     POCT glycosylated hemoglobin (Hb A1C)  Vitamin D deficiency -     VITAMIN D 25 Hydroxy (Vit-D Deficiency, Fractures); Future  B12 deficiency -     B12 and Folate Panel; Future -     Cyanocobalamin  Fatty liver, alcoholic -     Comprehensive metabolic panel; Future  Thrombocytopenia (HCC) Assessment & Plan: Recurrent,  due to alcoholism . Repeat in 3 months  Lab Results  Component Value Date   WBC 6.7 01/29/2023   HGB 14.2 01/29/2023   HCT 40.4 01/29/2023   MCV 102.4 (H) 01/29/2023   PLT 169.0 01/29/2023      Orders: -     CBC with Differential/Platelet; Future  Moderate mixed hyperlipidemia not requiring statin therapy -     Lipid Panel w/reflex Direct LDL; Future  Alcohol dependence with alcohol-induced mood disorder [facility]) Assessment & Plan: Mood has improved with adherence to sobriety and increased contact with family   Prediabetes  Assessment & Plan: A1c has risen to 6.3 with altered eating habits . Increase in sugary foods. Dietary counselling given.   Recheck in 3 months   Lab Results  Component Value Date    HGBA1C 6.3 (A) 03/19/2023   Lab Results  Component Value Date   NA 135 01/29/2023   K 4.0 01/29/2023   CL 100 01/29/2023   CO2 27 01/29/2023      Other orders -     Omeprazole; TAKE ONE CAPSULE BY MOUTH TWO TIMES DAILY  Dispense: 180 capsule; Refill: 1 -     Gabapentin; Take 2 capsules (800 mg total) by mouth 3 (three) times daily.  Dispense: 180 capsule; Refill: 2     I provided 30 minutes of face-to-face time during this encounter reviewing patient's last visit with me, patient's  most recent visit with cardiology,  nephrology,  and neurology,  recent surgical and non surgical procedures, previous  labs and imaging studies, counseling on currently addressed issues,  and post visit ordering to diagnostics and therapeutics .   Follow-up: No follow-ups on file.   Sherlene Shams, MD

## 2023-03-25 ENCOUNTER — Telehealth: Payer: Self-pay

## 2023-03-25 NOTE — Telephone Encounter (Signed)
Copied from CRM [id number]. Topic: Clinical - Medical Advice >> Mar 25, 2023  1:07 PM Melissa C wrote: Reason for CRM: Patient was referred to Vascular by Dr. Darrick Huntsman during last visit and patient cannot see in her Mychart who she wants her to see/ a phone number for where she is referring her. I looked on my end and in referrals that information is not provided either. Please advise with patient contact information/physician's name of who you are referring her to. Thank you

## 2023-03-25 NOTE — Telephone Encounter (Signed)
noted

## 2023-03-29 DIAGNOSIS — R2681 Unsteadiness on feet: Secondary | ICD-10-CM | POA: Diagnosis not present

## 2023-03-29 DIAGNOSIS — M6281 Muscle weakness (generalized): Secondary | ICD-10-CM | POA: Diagnosis not present

## 2023-04-12 ENCOUNTER — Encounter: Payer: Self-pay | Admitting: *Deleted

## 2023-04-12 ENCOUNTER — Other Ambulatory Visit: Payer: Self-pay | Admitting: Internal Medicine

## 2023-04-12 DIAGNOSIS — G621 Alcoholic polyneuropathy: Secondary | ICD-10-CM

## 2023-04-12 NOTE — Addendum Note (Signed)
Addended by: Sandy Salaam on: 04/12/2023 01:11 PM   Modules accepted: Orders

## 2023-04-12 NOTE — Telephone Encounter (Signed)
Pt is requesting a refill of Tramadol. She stated that she has had to increase the amount she takes. She is having to take 1 tablet in the morning and 1 in the evening to control the pain.

## 2023-04-12 NOTE — Telephone Encounter (Signed)
Copied from CRM [id number]. Topic: Clinical - Medication Refill >> Apr 12, 2023 11:18 AM Aletta Edouard wrote: Most Recent Primary Care Visit:  Provider: Sherlene Shams  Department: [facility]-  Visit Type: OFFICE VISIT  Date: 03/19/2023  Medication: traMADol (ULTRAM) 50 MG tablet patient is needing to take two tabs a day one in the morning and one at night   Has the patient contacted their pharmacy? No (Agent: If no, request that the patient contact the pharmacy for the refill. If patient does not wish to contact the pharmacy document the reason why and proceed with request.) (Agent: If yes, when and what did the pharmacy advise?)  Is this the correct pharmacy for this prescription? Yes If no, delete pharmacy and type the correct one.  This is the patient's preferred pharmacy:  [facility] - [place], [place] - [address] [place] [address] Phone: [phone] Fax: [phone]  [id number] [place], [place] - [address] AT [facility] OF [address] [place] [address] Phone: [phone] Fax: [phone]   Has the prescription been filled recently? No  Is the patient out of the medication? Yes  Has the patient been seen for an appointment in the last year OR does the patient have an upcoming appointment? Yes  Can we respond through MyChart? No  Agent: Please be advised that Rx refills may take up to 3 business days. We ask that you follow-up with your pharmacy.

## 2023-04-12 NOTE — Telephone Encounter (Signed)
See new mychart message created and sent to patient. This message was attached to an old mychart message in error.

## 2023-04-12 NOTE — Telephone Encounter (Signed)
Patient is calling in regarding her prolia  shot she wants to know if she is due for one now she would like a call back regarding this

## 2023-04-14 MED ORDER — TRAMADOL HCL 50 MG PO TABS
50.0000 mg | ORAL_TABLET | Freq: Two times a day (BID) | ORAL | 2 refills | Status: DC
Start: 2023-04-14 — End: 2023-08-16

## 2023-04-14 NOTE — Telephone Encounter (Signed)
Refilling at #60/month for 90 days .  Needs OV in May/June

## 2023-04-14 NOTE — Assessment & Plan Note (Addendum)
Managed with gabapentin 400 mg dose 3 to 4 times daily.  Adding tramadol 50 mg at bedtime in January,  increasing to twice daily as of Apr 13 2023

## 2023-04-14 NOTE — Addendum Note (Signed)
Addended by: Sherlene Shams on: 04/14/2023 08:08 AM   Modules accepted: Orders

## 2023-04-23 ENCOUNTER — Other Ambulatory Visit: Payer: Medicare HMO

## 2023-04-23 DIAGNOSIS — K700 Alcoholic fatty liver: Secondary | ICD-10-CM

## 2023-04-23 DIAGNOSIS — D696 Thrombocytopenia, unspecified: Secondary | ICD-10-CM | POA: Diagnosis not present

## 2023-04-23 DIAGNOSIS — E538 Deficiency of other specified B group vitamins: Secondary | ICD-10-CM | POA: Diagnosis not present

## 2023-04-23 DIAGNOSIS — E782 Mixed hyperlipidemia: Secondary | ICD-10-CM

## 2023-04-23 DIAGNOSIS — E559 Vitamin D deficiency, unspecified: Secondary | ICD-10-CM | POA: Diagnosis not present

## 2023-04-23 LAB — CBC WITH DIFFERENTIAL/PLATELET
Basophils Absolute: 0 10*3/uL (ref 0.0–0.1)
Basophils Relative: 0.5 % (ref 0.0–3.0)
Eosinophils Absolute: 0.1 10*3/uL (ref 0.0–0.7)
Eosinophils Relative: 1.7 % (ref 0.0–5.0)
HCT: 42.3 % (ref 36.0–46.0)
Hemoglobin: 14.5 g/dL (ref 12.0–15.0)
Lymphocytes Relative: 41.2 % (ref 12.0–46.0)
Lymphs Abs: 2.9 10*3/uL (ref 0.7–4.0)
MCHC: 34.4 g/dL (ref 30.0–36.0)
MCV: 92.3 fL (ref 78.0–100.0)
Monocytes Absolute: 0.5 10*3/uL (ref 0.1–1.0)
Monocytes Relative: 6.7 % (ref 3.0–12.0)
Neutro Abs: 3.5 10*3/uL (ref 1.4–7.7)
Neutrophils Relative %: 49.9 % (ref 43.0–77.0)
Platelets: 226 10*3/uL (ref 150.0–400.0)
RBC: 4.58 Mil/uL (ref 3.87–5.11)
RDW: 14.4 % (ref 11.5–15.5)
WBC: 7 10*3/uL (ref 4.0–10.5)

## 2023-04-23 LAB — B12 AND FOLATE PANEL
Folate: 16.9 ng/mL (ref >5.9)
Vitamin B-12: 421 pg/mL (ref 211–911)

## 2023-04-23 LAB — COMPREHENSIVE METABOLIC PANEL
ALT: 13 U/L (ref 0–35)
AST: 22 U/L (ref 0–37)
Albumin: 4.4 g/dL (ref 3.5–5.2)
Alkaline Phosphatase: 69 U/L (ref 39–117)
BUN: 12 mg/dL (ref 6–23)
CO2: 30 meq/L (ref 19–32)
Calcium: 10.4 mg/dL (ref 8.4–10.5)
Chloride: 91 meq/L — ABNORMAL LOW (ref 96–112)
Creatinine, Ser: 0.7 mg/dL (ref 0.40–1.20)
GFR: 93.41 mL/min (ref >60.00)
Glucose, Bld: 107 mg/dL — ABNORMAL HIGH (ref 70–99)
Potassium: 4.5 meq/L (ref 3.5–5.1)
Sodium: 130 meq/L — ABNORMAL LOW (ref 135–145)
Total Bilirubin: 0.7 mg/dL (ref 0.2–1.2)
Total Protein: 7.7 g/dL (ref 6.0–8.3)

## 2023-04-23 LAB — VITAMIN D 25 HYDROXY (VIT D DEFICIENCY, FRACTURES): VITD: 51.14 ng/mL (ref 30.00–100.00)

## 2023-04-23 MED ORDER — CYANOCOBALAMIN 1000 MCG/ML IJ SOLN
1000.0000 ug | Freq: Once | INTRAMUSCULAR | Status: AC
  Administered 2023-04-23: 1000 ug via INTRAMUSCULAR

## 2023-04-23 NOTE — Progress Notes (Signed)
 After obtaining consent, and per orders of Dr. Darrick Huntsman, injection of B-12 given by Valentino Nose. Patient tolerated injection well.

## 2023-04-24 LAB — LIPID PANEL W/REFLEX DIRECT LDL
Cholesterol: 181 mg/dL (ref <200)
HDL: 66 mg/dL (ref >=50)
LDL Cholesterol (Calc): 95 mg/dL
Non-HDL Cholesterol (Calc): 115 mg/dL (ref <130)
Total CHOL/HDL Ratio: 2.7 (calc) (ref <5.0)
Triglycerides: 100 mg/dL (ref <150)

## 2023-04-25 ENCOUNTER — Encounter: Payer: Self-pay | Admitting: Internal Medicine

## 2023-05-02 MED ORDER — DENOSUMAB 60 MG/ML ~~LOC~~ SOSY
60.0000 mg | PREFILLED_SYRINGE | Freq: Once | SUBCUTANEOUS | Status: AC
  Administered 2023-05-20: 60 mg via SUBCUTANEOUS

## 2023-05-02 NOTE — Addendum Note (Signed)
 Addended by: Warden Fillers on: 05/02/2023 11:32 AM   Modules accepted: Orders

## 2023-05-03 DIAGNOSIS — M6281 Muscle weakness (generalized): Secondary | ICD-10-CM | POA: Diagnosis not present

## 2023-05-03 DIAGNOSIS — R2681 Unsteadiness on feet: Secondary | ICD-10-CM | POA: Diagnosis not present

## 2023-05-06 ENCOUNTER — Telehealth: Payer: Self-pay

## 2023-05-06 NOTE — Telephone Encounter (Signed)
 Copied from CRM [id number]. Topic: General - Other >> May 06, 2023 12:02 PM Kathy [name] wrote: Reason for CRM: Patient is calling to schedule an appointment for her Prolia injection. Per patient she states she received a message from her insurance that it was improved. She would like to confirm that the approval is received.

## 2023-05-06 NOTE — Telephone Encounter (Signed)
Spoke with pt and scheduled her for a nurse visit.

## 2023-05-11 ENCOUNTER — Other Ambulatory Visit (INDEPENDENT_AMBULATORY_CARE_PROVIDER_SITE_OTHER): Payer: Self-pay | Admitting: Nurse Practitioner

## 2023-05-11 DIAGNOSIS — R0989 Other specified symptoms and signs involving the circulatory and respiratory systems: Secondary | ICD-10-CM

## 2023-05-12 ENCOUNTER — Ambulatory Visit (INDEPENDENT_AMBULATORY_CARE_PROVIDER_SITE_OTHER): Payer: Medicaid Other | Admitting: Nurse Practitioner

## 2023-05-12 ENCOUNTER — Encounter (INDEPENDENT_AMBULATORY_CARE_PROVIDER_SITE_OTHER): Payer: Self-pay | Admitting: Nurse Practitioner

## 2023-05-12 ENCOUNTER — Ambulatory Visit (INDEPENDENT_AMBULATORY_CARE_PROVIDER_SITE_OTHER): Payer: Medicare HMO

## 2023-05-12 VITALS — BP 125/84 | HR 67 | Resp 16 | Ht 61.5 in | Wt 103.6 lb

## 2023-05-12 DIAGNOSIS — R0989 Other specified symptoms and signs involving the circulatory and respiratory systems: Secondary | ICD-10-CM | POA: Diagnosis not present

## 2023-05-12 DIAGNOSIS — Z720 Tobacco use: Secondary | ICD-10-CM | POA: Diagnosis not present

## 2023-05-12 DIAGNOSIS — G621 Alcoholic polyneuropathy: Secondary | ICD-10-CM

## 2023-05-12 NOTE — Progress Notes (Signed)
 Subjective:    Patient ID: Kathy [name], female    DOB: August 26, 1960, 63 y.o.   MRN: [id number] Chief Complaint  Patient presents with   New Patient (Initial Visit)    Ref Darrick Huntsman consult decreased pulses in feet    The patient is a 63 year old female who presents today as a referral from Dr. Darrick Huntsman with concerns for possible peripheral arterial disease.  The patient has a history of smoking.  Additionally she has neuropathy.  The patient is a recovering alcoholic and developed worsening neuropathic symptoms after her most recent relapse.  Her PCP has increased her gabapentin to the maximum dose but despite this it is not effective at managing her discomfort at all.  She denies classic claudication-like symptoms.  Currently she has no open wounds or ulcerations.  Today noninvasive studies show a right ABI of 1.08 on the left and 1.17.  She has triphasic tibial vessel waveforms bilaterally with normal toe waveforms bilaterally.  She has normal toe pressures bilaterally as well.    Review of Systems  Neurological:  Positive for numbness.  All other systems reviewed and are negative.      Objective:   Physical Exam Vitals reviewed.  HENT:     Head: Normocephalic.  Cardiovascular:     Rate and Rhythm: Normal rate.     Pulses:          Dorsalis pedis pulses are detected w/ Doppler on the right side and detected w/ Doppler on the left side.       Posterior tibial pulses are detected w/ Doppler on the right side and detected w/ Doppler on the left side.  Pulmonary:     Effort: Pulmonary effort is normal.  Skin:    General: Skin is warm and dry.  Neurological:     Mental Status: She is alert and oriented to person, place, and time.  Psychiatric:        Mood and Affect: Mood normal.        Behavior: Behavior normal.        Thought Content: Thought content normal.        Judgment: Judgment normal.     BP 125/84   Pulse 67   Resp 16   Ht 5' 1.5" (1.562 m)   Wt 103 lb 9.6 oz  (47 kg)   BMI 19.26 kg/m   Past Medical History:  Diagnosis Date   Abdominal aortic atherosclerosis (HCC)    Abnormal weight loss    Alcohol abuse    Alcohol abuse with withdrawal (HCC) 10/13/2020   Alcohol withdrawal (HCC) 11/29/2022   Alcoholic hepatitis 09/09/2020   Suggested by transaminitis noted during ER visit July 16 for intractible  N/V      Alcoholic ketoacidosis 07/12/2014   Noted during both  ER visits (June and July 2022) in the setting of intractable N/V  And history of chronic alcohol abuse.  No workup was done,  No ETOH level, documentation of last alcohol ingestion,  or tox screen.  GFR was normal  Urinalysis noted a pH of 5.0.  Review of recrods from May 2016 hospitlaization  note presentations of a severe  metabolic gap acidosis with respiratory compensation (   Allergic rhinitis    Anxiety    Bronchitis    Chicken pox    Colon polyps    COPD (chronic obstructive pulmonary disease) (HCC)    Depression    Dyspnea    Elevated lipase    Fatty liver  Gallbladder mass 09/09/2020   8 x 6 x 6 mm non mobile  mass along GB wall noted on 2018 [place] 8 x  7 x 5 mm non mobile mass along GB wall noted on July  2022 [place]   GERD (gastroesophageal reflux disease)    Hepatitis    Hernia of abdominal cavity    Hip fracture (HCC) right   Left upper quadrant abdominal pain    Neuromuscular disorder (HCC)    Neuropathy    lower legs   Thrombocytopenia (HCC)    Wears dentures    full upper and lower    Social History   Socioeconomic History   Marital status: Divorced    Spouse name: Not on file   Number of children: Not on file   Years of education: Not on file   Highest education level: Not on file  Occupational History   Not on file  Tobacco Use   Smoking status: Every Day    Current packs/day: 0.50    Average packs/day: 0.5 packs/day for 40.0 years (20.0 ttl pk-yrs)    Types: Cigarettes   Smokeless tobacco: Never   Tobacco comments:    Started smoking age 59.   Quit during pregnancies.  Vaping Use   Vaping status: Never Used  Substance and Sexual Activity   Alcohol use: Never    Alcohol/week: 42.0 standard drinks of alcohol    Types: 42 Glasses of wine per week    Comment: 07/23/21 - Pt reports "clean" for 5 yrs.  now drinks approx 6 drinks/wk   Drug use: No   Sexual activity: Not Currently  Other Topics Concern   Not on file  Social History Narrative   Not on file   Social Drivers of Health   Financial Resource Strain: Low Risk  (09/29/2022)   Overall Financial Resource Strain (CARDIA)    Difficulty of Paying Living Expenses: Not hard at all  Food Insecurity: No Food Insecurity (11/28/2022)   Hunger Vital Sign    Worried About Running Out of Food in the Last Year: Never true    Ran Out of Food in the Last Year: Never true  Transportation Needs: No Transportation Needs (11/28/2022)   PRAPARE - Administrator, Civil Service (Medical): No    Lack of Transportation (Non-Medical): No  Physical Activity: Insufficiently Active (09/29/2022)   Exercise Vital Sign    Days of Exercise per Week: 5 days    Minutes of Exercise per Session: 20 min  Stress: No Stress Concern Present (09/29/2022)   [employer] of Occupational Health - Occupational Stress Questionnaire    Feeling of Stress : Not at all  Social Connections: Moderately Isolated (09/29/2022)   Social Connection and Isolation Panel [NHANES]    Frequency of Communication with Friends and Family: More than three times a week    Frequency of Social Gatherings with Friends and Family: More than three times a week    Attends Religious Services: More than 4 times per year    Active Member of [employer] or Organizations: No    Attends Banker Meetings: Never    Marital Status: Divorced  Catering manager Violence: Not At Risk (11/28/2022)   Humiliation, Afraid, Rape, and Kick questionnaire    Fear of Current or Ex-Partner: No    Emotionally Abused: No    Physically Abused: No     Sexually Abused: No    Past Surgical History:  Procedure Laterality Date   BIOPSY  08/31/2022  Procedure: BIOPSY;  Surgeon: Regis Bill, MD;  Location: [facility] END[facility]COPY;  Service: Endoscopy;;   CATARACT EXTRACTION W/PHACO Right 07/29/2021   Procedure: CATARACT EXTRACTION PHACO AND INTRAOCULAR LENS PLACEMENT (IOC) RIGHT 19.12 01:27.4;  Surgeon: Galen Manila, MD;  Location: [facility];  Service: Ophthalmology;  Laterality: Right;   CATARACT EXTRACTION W/PHACO Left 08/12/2021   Procedure: CATARACT EXTRACTION PHACO AND INTRAOCULAR LENS PLACEMENT (IOC) LEFT 13.10 01:05.3;  Surgeon: Galen Manila, MD;  Location: [facility];  Service: Ophthalmology;  Laterality: Left;   CHOLECYSTECTOMY     COLONOSCOPY     COLONOSCOPY WITH PROPOFOL N/A 08/31/2022   Procedure: COLONOSCOPY WITH PROPOFOL;  Surgeon: Regis Bill, MD;  Location: [facility] ENDOSCOPY;  Service: Endoscopy;  Laterality: N/A;   ESOPHAGOGASTRODUODENOSCOPY (EGD) WITH PROPOFOL N/A 08/31/2022   Procedure: ESOPHAGOGASTRODUODENOSCOPY (EGD) WITH PROPOFOL;  Surgeon: Regis Bill, MD;  Location: [facility] ENDOSCOPY;  Service: Endoscopy;  Laterality: N/A;   EYE SURGERY     POLYPECTOMY  08/31/2022   Procedure: POLYPECTOMY;  Surgeon: Regis Bill, MD;  Location: [facility] ENDOSCOPY;  Service: Endoscopy;;   ROBOTIC ASSISTED LAPAROSCOPIC CHOLECYSTECTOMY  04/16/2021   [facility]    Family History  Problem Relation Age of Onset   Hypertension Mother    Osteoporosis Mother    Meniere's disease Mother    Cancer Father 54       prostate   Colon cancer Paternal Grandmother    Alcohol abuse Other    Breast cancer Neg Hx     Allergies  Allergen Reactions   Azithromycin Other (See Comments)   Buspirone Hives   Ciprofloxacin Hives   Lyrica [Pregabalin] Other (See Comments)    Blurred vision        Latest Ref Rng & Units 04/23/2023   10:07 AM 01/29/2023   11:31 AM 12/18/2022   11:52 AM  CBC  WBC 4.0 - 10.5 K/uL  7.0  6.7  6.4   Hemoglobin 12.0 - 15.0 g/dL 09.8  11.9  14.7   Hematocrit 36.0 - 46.0 % 42.3  40.4  32.8   Platelets 150.0 - 400.0 K/uL 226.0  169.0  373.0       CMP     Component Value Date/Time   NA 130 (L) 04/23/2023 1007   NA 138 10/03/2018 0841   K 4.5 04/23/2023 1007   CL 91 (L) 04/23/2023 1007   CO2 30 04/23/2023 1007   GLUCOSE 107 (H) 04/23/2023 1007   BUN 12 04/23/2023 1007   BUN 13 10/03/2018 0841   CREATININE 0.70 04/23/2023 1007   CALCIUM 10.4 04/23/2023 1007   PROT 7.7 04/23/2023 1007   PROT 6.9 10/03/2018 0841   ALBUMIN 4.4 04/23/2023 1007   ALBUMIN 4.3 10/03/2018 0841   AST 22 04/23/2023 1007   ALT 13 04/23/2023 1007   ALKPHOS 69 04/23/2023 1007   BILITOT 0.7 04/23/2023 1007   BILITOT 0.4 10/03/2018 0841   GFR 93.41 04/23/2023 1007   GFRNONAA >60 12/07/2022 0438     No results found.     Assessment & Plan:   1. Alcoholic peripheral neuropathy (HCC) (Primary) I suspect the patient's ongoing issues are related to some worsening neuropathy.  Will refer the patient to neurology for further evaluation and possible medication changes as the patient's current regimen for neuropathy is not effective any longer. - Ambulatory referral to Neurology  2. Decreased pulses in feet Today the patient's noninvasive studies revealed ABIs within the normal range with strong multiphasic waveforms in the bilateral tibial  vessels.  Based on this we will have the patient follow-up on an as-needed basis.  No further intervention necessary.  3. Tobacco abuse Smoking cessation was discussed, 3-10 minutes spent on this topic specifically   Current Outpatient Medications on File Prior to Visit  Medication Sig Dispense Refill   gabapentin (NEURONTIN) 400 MG capsule Take 2 capsules (800 mg total) by mouth 3 (three) times daily. 180 capsule 2   hydrOXYzine (ATARAX) 10 MG tablet Take 1 tablet (10 mg total) by mouth 2 (two) times daily as needed. 180 tablet 0   omeprazole  (PRILOSEC) 40 MG capsule TAKE ONE CAPSULE BY MOUTH TWO TIMES DAILY 180 capsule 1   ondansetron (ZOFRAN-ODT) 8 MG disintegrating tablet TAKE 1 TABLET BY MOUTH EVERY 8 HOURS AS NEEDED FOR NAUSEA AND VOMITING 30 tablet 2   traMADol (ULTRAM) 50 MG tablet Take 1 tablet (50 mg total) by mouth 2 (two) times daily. 60 tablet 2   Current Facility-Administered Medications on File Prior to Visit  Medication Dose Route Frequency Provider Last Rate Last Admin   [START ON 05/17/2023] denosumab (PROLIA) injection 60 mg  60 mg Subcutaneous Once Sherlene Shams, MD        There are no Patient Instructions on file for this visit. No follow-ups on file.   Georgiana Spinner, NP

## 2023-05-13 LAB — VAS US ABI WITH/WO TBI
Left ABI: 1.17
Right ABI: 1.08

## 2023-05-14 DIAGNOSIS — M6281 Muscle weakness (generalized): Secondary | ICD-10-CM | POA: Diagnosis not present

## 2023-05-14 DIAGNOSIS — R2681 Unsteadiness on feet: Secondary | ICD-10-CM | POA: Diagnosis not present

## 2023-05-14 NOTE — Telephone Encounter (Signed)
 Still awating VOB & PA. Pt states that she received an approval but I do not see it.  Scheduled for Prolia on 05/20/23

## 2023-05-20 ENCOUNTER — Ambulatory Visit

## 2023-05-20 DIAGNOSIS — E538 Deficiency of other specified B group vitamins: Secondary | ICD-10-CM

## 2023-05-20 DIAGNOSIS — M810 Age-related osteoporosis without current pathological fracture: Secondary | ICD-10-CM

## 2023-05-20 MED ORDER — CYANOCOBALAMIN 1000 MCG/ML IJ SOLN
1000.0000 ug | Freq: Once | INTRAMUSCULAR | Status: AC
  Administered 2023-05-20: 1000 ug via INTRAMUSCULAR

## 2023-05-20 MED ORDER — DENOSUMAB 60 MG/ML ~~LOC~~ SOSY
60.0000 mg | PREFILLED_SYRINGE | SUBCUTANEOUS | Status: AC
  Administered 2023-12-27: 60 mg via SUBCUTANEOUS

## 2023-05-20 NOTE — Telephone Encounter (Signed)
 PA found in chart from November.  Added to Prolia referral

## 2023-05-20 NOTE — Progress Notes (Signed)
 Patient presented for B 12 injection to left deltoid, patient voiced no concerns nor showed any signs of distress during injection.  Pt also received her Prolia injection in the right arm, SQ. Pt voiced no concern nor showed any signs of distress during the injection.

## 2023-05-21 ENCOUNTER — Telehealth: Payer: Self-pay | Admitting: Family

## 2023-05-21 DIAGNOSIS — Z780 Asymptomatic menopausal state: Secondary | ICD-10-CM

## 2023-05-21 NOTE — Telephone Encounter (Signed)
 Spoke to pt she has not had one done order placed she will call to schedule

## 2023-05-21 NOTE — Telephone Encounter (Signed)
 Pt is on prolia  Due for dexa, last 2017  Please ask if dexa done elsewhere  Otherwise please order and advise pt to schedule

## 2023-05-24 DIAGNOSIS — M6281 Muscle weakness (generalized): Secondary | ICD-10-CM | POA: Diagnosis not present

## 2023-05-24 DIAGNOSIS — R2681 Unsteadiness on feet: Secondary | ICD-10-CM | POA: Diagnosis not present

## 2023-05-29 ENCOUNTER — Other Ambulatory Visit: Payer: Self-pay | Admitting: Internal Medicine

## 2023-06-02 ENCOUNTER — Telehealth (INDEPENDENT_AMBULATORY_CARE_PROVIDER_SITE_OTHER): Payer: Self-pay | Admitting: Nurse Practitioner

## 2023-06-02 ENCOUNTER — Telehealth: Payer: Self-pay

## 2023-06-02 NOTE — Telephone Encounter (Signed)
 Error

## 2023-06-02 NOTE — Telephone Encounter (Signed)
 noted

## 2023-06-02 NOTE — Telephone Encounter (Signed)
 Copied from CRM [id number]. Topic: Appointments - Appointment Scheduling >> Jun 02, 2023 12:39 PM Truddie Crumble wrote: Patient/patient representative is calling to schedule an appointment. Refer to attachments for appointment information.   Patient called stating she will call the vein surgeon regarding a referral and she will also call back to talk to the provider about a referral for neurology

## 2023-06-04 ENCOUNTER — Other Ambulatory Visit (HOSPITAL_COMMUNITY): Payer: Self-pay

## 2023-06-07 ENCOUNTER — Ambulatory Visit (INDEPENDENT_AMBULATORY_CARE_PROVIDER_SITE_OTHER)

## 2023-06-07 ENCOUNTER — Telehealth: Payer: Self-pay

## 2023-06-07 DIAGNOSIS — E538 Deficiency of other specified B group vitamins: Secondary | ICD-10-CM | POA: Diagnosis not present

## 2023-06-07 MED ORDER — CYANOCOBALAMIN 1000 MCG/ML IJ SOLN
1000.0000 ug | Freq: Once | INTRAMUSCULAR | Status: AC
  Administered 2023-06-07: 1000 ug via INTRAMUSCULAR

## 2023-06-07 NOTE — Progress Notes (Signed)
 Patient presented for B 12 injection to left deltoid, patient voiced no concerns nor showed any signs of distress during injection.

## 2023-06-07 NOTE — Telephone Encounter (Signed)
 Called pt to get her scheduled a 6 month follow up with PCP and for A1c compliance list, pt stated she was waiting to get scheduled for her follow up as she wants to get her other appts set up with the surgeons first prior to scheduling. She stated it may be some time after easter.

## 2023-06-21 ENCOUNTER — Ambulatory Visit (INDEPENDENT_AMBULATORY_CARE_PROVIDER_SITE_OTHER)

## 2023-06-21 DIAGNOSIS — E538 Deficiency of other specified B group vitamins: Secondary | ICD-10-CM | POA: Diagnosis not present

## 2023-06-21 MED ORDER — CYANOCOBALAMIN 1000 MCG/ML IJ SOLN
1000.0000 ug | Freq: Once | INTRAMUSCULAR | Status: AC
  Administered 2023-06-21: 1000 ug via INTRAMUSCULAR

## 2023-06-21 NOTE — Progress Notes (Signed)
 Pt presented for their vitamin B12 injection. Pt was identified through two identifiers. Pt tolerated shot well in their right deltoid.

## 2023-06-22 DIAGNOSIS — M6281 Muscle weakness (generalized): Secondary | ICD-10-CM | POA: Diagnosis not present

## 2023-06-22 DIAGNOSIS — R2681 Unsteadiness on feet: Secondary | ICD-10-CM | POA: Diagnosis not present

## 2023-06-30 ENCOUNTER — Telehealth: Payer: Self-pay

## 2023-06-30 DIAGNOSIS — G621 Alcoholic polyneuropathy: Secondary | ICD-10-CM

## 2023-06-30 NOTE — Telephone Encounter (Signed)
 Pended neurology referral for your approval. Needs a dx code.

## 2023-06-30 NOTE — Addendum Note (Signed)
 Addended by: Thersia Flax on: 06/30/2023 07:50 PM   Modules accepted: Orders

## 2023-06-30 NOTE — Addendum Note (Signed)
 Addended by: Chadwick Colonel on: 06/30/2023 03:33 PM   Modules accepted: Orders

## 2023-06-30 NOTE — Telephone Encounter (Signed)
 Copied from CRM [id number]. Topic: Referral - Request for Referral >> Jun 30, 2023  2:23 PM Adonis Hoot wrote: Did the patient discuss referral with their provider in the last year? Yes (If No - schedule appointment) (If Yes - send message)  Appointment offered? No  Type of order/referral and detailed reason for visit: Neurology/Dr Walden Guise  Preference of office, provider, location: [facility]  If referral order, have you been seen by this specialty before? Yes (If Yes, this issue or another issue? When? Where?  Can we respond through MyChart? Yes  **Vein and vascular tried to get her in with Dr Hayward Lis they are needing the referral to come from PCP**

## 2023-07-01 NOTE — Telephone Encounter (Signed)
 Pt is aware that referral has been placed.

## 2023-07-09 DIAGNOSIS — R2681 Unsteadiness on feet: Secondary | ICD-10-CM | POA: Diagnosis not present

## 2023-07-09 DIAGNOSIS — M6281 Muscle weakness (generalized): Secondary | ICD-10-CM | POA: Diagnosis not present

## 2023-07-13 ENCOUNTER — Encounter (INDEPENDENT_AMBULATORY_CARE_PROVIDER_SITE_OTHER): Payer: Self-pay

## 2023-07-26 ENCOUNTER — Ambulatory Visit (INDEPENDENT_AMBULATORY_CARE_PROVIDER_SITE_OTHER)

## 2023-07-26 DIAGNOSIS — E538 Deficiency of other specified B group vitamins: Secondary | ICD-10-CM | POA: Diagnosis not present

## 2023-07-26 MED ORDER — CYANOCOBALAMIN 1000 MCG/ML IJ SOLN
1000.0000 ug | Freq: Once | INTRAMUSCULAR | Status: AC
  Administered 2023-07-26: 1000 ug via INTRAMUSCULAR

## 2023-07-26 NOTE — Progress Notes (Signed)
 Pt presented for their vitamin B12 injection. Pt was identified through two identifiers. Pt tolerated shot well in their left deltoid.   Pt also provided me with a Handicap placard renewal form that she would like the provider to sign, form has been given to [place] and pt informed that Camilo Cella will give her a call once form is signed and ready for pick up

## 2023-07-29 ENCOUNTER — Telehealth: Payer: Self-pay

## 2023-07-29 NOTE — Telephone Encounter (Signed)
 Copied from CRM [id number]. Topic: General - Other >> Jul 29, 2023 10:32 AM Kita Perish H wrote: Reason for CRM: Patient is calling to have Dr. Madelon Scheuermann completed another letter stating patients disabilities are anxiety, depression and neuropathy. Patient states provider has completed this letter before and she needs an updated one. Patient will pick up, patient needs it within a week if possible, this is her year to re-apply for disability.  Mariann [phone]

## 2023-07-29 NOTE — Telephone Encounter (Signed)
 Mychart message sent concerning handicap placard being ready to be picked up.  Placed in file folder at the front desk.

## 2023-08-03 ENCOUNTER — Telehealth: Payer: Self-pay

## 2023-08-03 NOTE — Telephone Encounter (Signed)
 I have placed in quick sign folder for signature.

## 2023-08-03 NOTE — Telephone Encounter (Signed)
 Copied from CRM [id number]. Topic: General - Other >> Jul 29, 2023 10:32 AM Kita Perish H wrote: Reason for CRM: Patient is calling to have Dr. Madelon Scheuermann completed another letter stating patients disabilities are anxiety, depression and neuropathy. Patient states provider has completed this letter before and she needs an updated one. Patient will pick up, patient needs it within a week if possible, this is her year to re-apply for disability.  Rafaela [phone] >> Aug 03, 2023  9:43 AM Clyde Darling P wrote: Pt advise she need to pick up form , sometimes this week. Pt can be reached [phone]

## 2023-08-03 NOTE — Telephone Encounter (Signed)
 Copied from CRM [id number]. Topic: General - Other >> Jul 29, 2023 10:32 AM Kita Perish H wrote: Reason for CRM: Patient is calling to have Dr. Madelon Scheuermann completed another letter stating patients disabilities are anxiety, depression and neuropathy. Patient states provider has completed this letter before and she needs an updated one. Patient will pick up, patient needs it within a week if possible, this is her year to re-apply for disability.  Kathy [name] [phone] >> Aug 03, 2023  9:43 AM Kathy [name] P wrote: Pt advise she need to pick up form , sometimes this week. Pt can be reached [phone]

## 2023-08-04 NOTE — Telephone Encounter (Signed)
 Pt is aware and stated that she would pick up this afternoon.

## 2023-08-12 ENCOUNTER — Other Ambulatory Visit: Payer: Self-pay | Admitting: Internal Medicine

## 2023-08-12 DIAGNOSIS — G621 Alcoholic polyneuropathy: Secondary | ICD-10-CM

## 2023-08-16 ENCOUNTER — Other Ambulatory Visit: Payer: Self-pay | Admitting: Internal Medicine

## 2023-08-16 DIAGNOSIS — G621 Alcoholic polyneuropathy: Secondary | ICD-10-CM

## 2023-08-16 NOTE — Telephone Encounter (Signed)
 Copied from CRM [id number]. Topic: Clinical - Medication Refill >> Aug 16, 2023  8:43 AM Thersia C wrote: Medication: traMADol  (ULTRAM ) 50 MG tablet  Has the patient contacted their pharmacy? Yes (Agent: If no, request that the patient contact the pharmacy for the refill. If patient does not wish to contact the pharmacy document the reason why and proceed with request.) (Agent: If yes, when and what did the pharmacy advise?)  This is the patient's preferred pharmacy:  [facility] - [place], [place] - [address] [place] [address] Phone: [phone] Fax: [phone]   Is this the correct pharmacy for this prescription? Yes If no, delete pharmacy and type the correct one.   Has the prescription been filled recently? No  Is the patient out of the medication? Yes  Has the patient been seen for an appointment in the last year OR does the patient have an upcoming appointment? Yes  Can we respond through MyChart? Yes  Agent: Please be advised that Rx refills may take up to 3 business days. We ask that you follow-up with your pharmacy.

## 2023-08-18 ENCOUNTER — Ambulatory Visit

## 2023-08-18 DIAGNOSIS — E538 Deficiency of other specified B group vitamins: Secondary | ICD-10-CM

## 2023-08-18 MED ORDER — CYANOCOBALAMIN 1000 MCG/ML IJ SOLN
1000.0000 ug | Freq: Once | INTRAMUSCULAR | Status: AC
Start: 2023-08-18 — End: 2023-08-18
  Administered 2023-08-18: 1000 ug via INTRAMUSCULAR

## 2023-08-18 NOTE — Progress Notes (Signed)
Pt received B12 injection in Left deltoid. Pt tolerated it well with no complaints or concerns.

## 2023-09-07 ENCOUNTER — Ambulatory Visit: Admitting: Internal Medicine

## 2023-09-11 ENCOUNTER — Other Ambulatory Visit: Payer: Self-pay

## 2023-09-11 ENCOUNTER — Encounter: Payer: Self-pay | Admitting: Internal Medicine

## 2023-09-11 ENCOUNTER — Inpatient Hospital Stay
Admission: EM | Admit: 2023-09-11 | Discharge: 2023-09-14 | DRG: 641 | Disposition: A | Discharge status: Home / Self Care | Attending: Internal Medicine | Admitting: Internal Medicine

## 2023-09-11 DIAGNOSIS — F17210 Nicotine dependence, cigarettes, uncomplicated: Secondary | ICD-10-CM | POA: Diagnosis present

## 2023-09-11 DIAGNOSIS — I4892 Unspecified atrial flutter: Secondary | ICD-10-CM | POA: Diagnosis present

## 2023-09-11 DIAGNOSIS — F411 Generalized anxiety disorder: Secondary | ICD-10-CM | POA: Diagnosis present

## 2023-09-11 DIAGNOSIS — F10239 Alcohol dependence with withdrawal, unspecified: Secondary | ICD-10-CM | POA: Diagnosis present

## 2023-09-11 DIAGNOSIS — R531 Weakness: Secondary | ICD-10-CM | POA: Diagnosis not present

## 2023-09-11 DIAGNOSIS — D696 Thrombocytopenia, unspecified: Secondary | ICD-10-CM | POA: Diagnosis present

## 2023-09-11 DIAGNOSIS — K700 Alcoholic fatty liver: Secondary | ICD-10-CM | POA: Diagnosis present

## 2023-09-11 DIAGNOSIS — J449 Chronic obstructive pulmonary disease, unspecified: Secondary | ICD-10-CM | POA: Diagnosis present

## 2023-09-11 DIAGNOSIS — Z720 Tobacco use: Principal | ICD-10-CM | POA: Diagnosis present

## 2023-09-11 DIAGNOSIS — Z888 Allergy status to other drugs, medicaments and biological substances status: Secondary | ICD-10-CM | POA: Diagnosis not present

## 2023-09-11 DIAGNOSIS — E876 Hypokalemia: Secondary | ICD-10-CM | POA: Diagnosis present

## 2023-09-11 DIAGNOSIS — E872 Acidosis, unspecified: Secondary | ICD-10-CM | POA: Diagnosis present

## 2023-09-11 DIAGNOSIS — Z8262 Family history of osteoporosis: Secondary | ICD-10-CM | POA: Diagnosis not present

## 2023-09-11 DIAGNOSIS — F1024 Alcohol dependence with alcohol-induced mood disorder: Secondary | ICD-10-CM | POA: Diagnosis present

## 2023-09-11 DIAGNOSIS — Z9841 Cataract extraction status, right eye: Secondary | ICD-10-CM

## 2023-09-11 DIAGNOSIS — Z881 Allergy status to other antibiotic agents status: Secondary | ICD-10-CM

## 2023-09-11 DIAGNOSIS — K219 Gastro-esophageal reflux disease without esophagitis: Secondary | ICD-10-CM | POA: Diagnosis present

## 2023-09-11 DIAGNOSIS — Y900 Blood alcohol level of less than 20 mg/100 ml: Secondary | ICD-10-CM | POA: Diagnosis present

## 2023-09-11 DIAGNOSIS — Z8249 Family history of ischemic heart disease and other diseases of the circulatory system: Secondary | ICD-10-CM

## 2023-09-11 DIAGNOSIS — E861 Hypovolemia: Secondary | ICD-10-CM | POA: Diagnosis present

## 2023-09-11 DIAGNOSIS — R000 Tachycardia, unspecified: Secondary | ICD-10-CM | POA: Diagnosis present

## 2023-09-11 DIAGNOSIS — Z79899 Other long term (current) drug therapy: Secondary | ICD-10-CM

## 2023-09-11 DIAGNOSIS — N261 Atrophy of kidney (terminal): Secondary | ICD-10-CM | POA: Diagnosis present

## 2023-09-11 DIAGNOSIS — R9431 Abnormal electrocardiogram [ECG] [EKG]: Secondary | ICD-10-CM | POA: Diagnosis not present

## 2023-09-11 DIAGNOSIS — Z9842 Cataract extraction status, left eye: Secondary | ICD-10-CM

## 2023-09-11 DIAGNOSIS — K7010 Alcoholic hepatitis without ascites: Secondary | ICD-10-CM | POA: Diagnosis present

## 2023-09-11 DIAGNOSIS — R7303 Prediabetes: Secondary | ICD-10-CM | POA: Diagnosis present

## 2023-09-11 DIAGNOSIS — E871 Hypo-osmolality and hyponatremia: Secondary | ICD-10-CM | POA: Diagnosis not present

## 2023-09-11 DIAGNOSIS — Z961 Presence of intraocular lens: Secondary | ICD-10-CM | POA: Diagnosis present

## 2023-09-11 LAB — COMPREHENSIVE METABOLIC PANEL WITH GFR
ALT: 31 U/L (ref 0–44)
ALT: 27 U/L (ref 0–44)
AST: 60 U/L — ABNORMAL HIGH (ref 15–41)
AST: 56 U/L — ABNORMAL HIGH (ref 15–41)
Albumin: 3.8 g/dL (ref 3.5–5.0)
Albumin: 3.3 g/dL — ABNORMAL LOW (ref 3.5–5.0)
Alkaline Phosphatase: 95 U/L (ref 38–126)
Alkaline Phosphatase: 77 U/L (ref 38–126)
Anion gap: 18 — ABNORMAL HIGH (ref 5–15)
Anion gap: 13 (ref 5–15)
BUN: 6 mg/dL — ABNORMAL LOW (ref 8–23)
BUN: <5 mg/dL — ABNORMAL LOW (ref 8–23)
CO2: 18 mmol/L — ABNORMAL LOW (ref 22–32)
CO2: 20 mmol/L — ABNORMAL LOW (ref 22–32)
Calcium: 7.2 mg/dL — ABNORMAL LOW (ref 8.9–10.3)
Calcium: 6.7 mg/dL — ABNORMAL LOW (ref 8.9–10.3)
Chloride: 77 mmol/L — ABNORMAL LOW (ref 98–111)
Chloride: 81 mmol/L — ABNORMAL LOW (ref 98–111)
Creatinine, Ser: 0.5 mg/dL (ref 0.44–1.00)
Creatinine, Ser: 0.44 mg/dL (ref 0.44–1.00)
GFR, Estimated: >60 mL/min (ref >60)
GFR, Estimated: >60 mL/min (ref >60)
Glucose, Bld: 140 mg/dL — ABNORMAL HIGH (ref 70–99)
Glucose, Bld: 115 mg/dL — ABNORMAL HIGH (ref 70–99)
Potassium: 3.3 mmol/L — ABNORMAL LOW (ref 3.5–5.1)
Potassium: 3.2 mmol/L — ABNORMAL LOW (ref 3.5–5.1)
Sodium: 113 mmol/L — CL (ref 135–145)
Sodium: 114 mmol/L — CL (ref 135–145)
Total Bilirubin: 2.8 mg/dL — ABNORMAL HIGH (ref 0.0–1.2)
Total Bilirubin: 2.3 mg/dL — ABNORMAL HIGH (ref 0.0–1.2)
Total Protein: 6.6 g/dL (ref 6.5–8.1)
Total Protein: 5.8 g/dL — ABNORMAL LOW (ref 6.5–8.1)

## 2023-09-11 LAB — URINE DRUG SCREEN, QUALITATIVE (ARMC ONLY)
Amphetamines, Ur Screen: NOT DETECTED
Barbiturates, Ur Screen: NOT DETECTED
Benzodiazepine, Ur Scrn: NOT DETECTED
Cannabinoid 50 Ng, Ur ~~LOC~~: NOT DETECTED
Cocaine Metabolite,Ur ~~LOC~~: NOT DETECTED
MDMA (Ecstasy)Ur Screen: NOT DETECTED
Methadone Scn, Ur: NOT DETECTED
Opiate, Ur Screen: NOT DETECTED
Phencyclidine (PCP) Ur S: NOT DETECTED
Tricyclic, Ur Screen: NOT DETECTED

## 2023-09-11 LAB — MRSA NEXT GEN BY PCR, NASAL: MRSA by PCR Next Gen: NOT DETECTED

## 2023-09-11 LAB — CBC
HCT: 34.7 % — ABNORMAL LOW (ref 36.0–46.0)
Hemoglobin: 12.9 g/dL (ref 12.0–15.0)
MCH: 32.2 pg (ref 26.0–34.0)
MCHC: 37.2 g/dL — ABNORMAL HIGH (ref 30.0–36.0)
MCV: 86.5 fL (ref 80.0–100.0)
Platelets: 133 K/uL — ABNORMAL LOW (ref 150–400)
RBC: 4.01 MIL/uL (ref 3.87–5.11)
RDW: 12.6 % (ref 11.5–15.5)
WBC: 10.2 K/uL (ref 4.0–10.5)
nRBC: 0 % (ref 0.0–0.2)

## 2023-09-11 LAB — RESP PANEL BY RT-PCR (RSV, FLU A&B, COVID)  RVPGX2
Influenza A by PCR: NEGATIVE
Influenza B by PCR: NEGATIVE
Resp Syncytial Virus by PCR: NEGATIVE
SARS Coronavirus 2 by RT PCR: NEGATIVE

## 2023-09-11 LAB — URINALYSIS, COMPLETE (UACMP) WITH MICROSCOPIC
Bacteria, UA: NONE SEEN
Bilirubin Urine: NEGATIVE
Glucose, UA: NEGATIVE mg/dL
Ketones, ur: 5 mg/dL — AB
Leukocytes,Ua: NEGATIVE
Nitrite: NEGATIVE
Protein, ur: NEGATIVE mg/dL
Specific Gravity, Urine: 1.001 — ABNORMAL LOW (ref 1.005–1.030)
pH: 7 (ref 5.0–8.0)

## 2023-09-11 LAB — LIPASE, BLOOD: Lipase: 29 U/L (ref 11–51)

## 2023-09-11 LAB — TROPONIN I (HIGH SENSITIVITY): Troponin I (High Sensitivity): 8 ng/L (ref <18)

## 2023-09-11 LAB — OSMOLALITY, URINE: Osmolality, Ur: 86 mosm/kg — ABNORMAL LOW (ref 300–900)

## 2023-09-11 LAB — ETHANOL: Alcohol, Ethyl (B): <15 mg/dL (ref <15)

## 2023-09-11 LAB — GLUCOSE, CAPILLARY: Glucose-Capillary: 128 mg/dL — ABNORMAL HIGH (ref 70–99)

## 2023-09-11 LAB — SODIUM
Sodium: 125 mmol/L — ABNORMAL LOW (ref 135–145)
Sodium: 130 mmol/L — ABNORMAL LOW (ref 135–145)

## 2023-09-11 LAB — HEMOGLOBIN A1C
Hgb A1c MFr Bld: 4.9 % (ref 4.8–5.6)
Mean Plasma Glucose: 93.93 mg/dL

## 2023-09-11 LAB — OSMOLALITY: Osmolality: 275 mosm/kg (ref 275–295)

## 2023-09-11 LAB — HIV ANTIBODY (ROUTINE TESTING W REFLEX): HIV Screen 4th Generation wRfx: NONREACTIVE

## 2023-09-11 LAB — SODIUM, URINE, RANDOM: Sodium, Ur: 21 mmol/L

## 2023-09-11 LAB — MAGNESIUM: Magnesium: 1.4 mg/dL — ABNORMAL LOW (ref 1.7–2.4)

## 2023-09-11 MED ORDER — FOLIC ACID 1 MG PO TABS
1.0000 mg | ORAL_TABLET | Freq: Every day | ORAL | Status: DC
  Administered 2023-09-11 – 2023-09-14 (×3): 1 mg via ORAL
  Filled 2023-09-11 (×4): qty 1

## 2023-09-11 MED ORDER — LORAZEPAM 2 MG/ML IJ SOLN
1.0000 mg | Freq: Once | INTRAMUSCULAR | Status: AC
  Administered 2023-09-11: 1 mg via INTRAVENOUS
  Filled 2023-09-11: qty 1

## 2023-09-11 MED ORDER — POTASSIUM CHLORIDE CRYS ER 20 MEQ PO TBCR
40.0000 meq | EXTENDED_RELEASE_TABLET | Freq: Once | ORAL | Status: AC
  Administered 2023-09-11: 40 meq via ORAL
  Filled 2023-09-11: qty 2

## 2023-09-11 MED ORDER — THIAMINE HCL 100 MG/ML IJ SOLN
100.0000 mg | Freq: Every day | INTRAMUSCULAR | Status: DC
  Administered 2023-09-12 – 2023-09-13 (×2): 100 mg via INTRAVENOUS
  Filled 2023-09-11 (×3): qty 2

## 2023-09-11 MED ORDER — CHLORHEXIDINE GLUCONATE CLOTH 2 % EX PADS
6.0000 | MEDICATED_PAD | Freq: Every day | CUTANEOUS | Status: DC
  Administered 2023-09-11 – 2023-09-14 (×4): 6 via TOPICAL

## 2023-09-11 MED ORDER — SODIUM CHLORIDE 0.9 % IV BOLUS
500.0000 mL | Freq: Once | INTRAVENOUS | Status: AC
  Administered 2023-09-11: 500 mL via INTRAVENOUS

## 2023-09-11 MED ORDER — APIXABAN 5 MG PO TABS
5.0000 mg | ORAL_TABLET | Freq: Two times a day (BID) | ORAL | Status: DC
  Administered 2023-09-11 (×2): 5 mg via ORAL
  Filled 2023-09-11 (×3): qty 1

## 2023-09-11 MED ORDER — HEPARIN SODIUM (PORCINE) 5000 UNIT/ML IJ SOLN
5000.0000 [IU] | Freq: Three times a day (TID) | INTRAMUSCULAR | Status: DC
  Administered 2023-09-11: 5000 [IU] via SUBCUTANEOUS
  Filled 2023-09-11: qty 1

## 2023-09-11 MED ORDER — DIAZEPAM 5 MG/ML IJ SOLN
5.0000 mg | INTRAMUSCULAR | Status: AC | PRN
  Administered 2023-09-11 – 2023-09-12 (×4): 5 mg via INTRAVENOUS
  Filled 2023-09-11 (×4): qty 2

## 2023-09-11 MED ORDER — DEXTROSE 5 % IV SOLN: INTRAVENOUS | Status: DC

## 2023-09-11 MED ORDER — SODIUM CHLORIDE 0.9 % IV SOLN: Freq: Once | INTRAVENOUS | Status: DC

## 2023-09-11 MED ORDER — LACTATED RINGERS IV BOLUS
1000.0000 mL | Freq: Once | INTRAVENOUS | Status: AC
  Administered 2023-09-11: 1000 mL via INTRAVENOUS

## 2023-09-11 MED ORDER — CALCIUM GLUCONATE-NACL 1-0.675 GM/50ML-% IV SOLN
1.0000 g | Freq: Once | INTRAVENOUS | Status: AC
  Administered 2023-09-11: 1000 mg via INTRAVENOUS
  Filled 2023-09-11: qty 50

## 2023-09-11 MED ORDER — ONDANSETRON HCL 4 MG/2ML IJ SOLN
4.0000 mg | Freq: Once | INTRAMUSCULAR | Status: AC
  Administered 2023-09-11: 4 mg via INTRAVENOUS
  Filled 2023-09-11: qty 2

## 2023-09-11 MED ORDER — DIAZEPAM 5 MG/ML IJ SOLN
2.5000 mg | Freq: Once | INTRAMUSCULAR | Status: AC
  Administered 2023-09-11: 2.5 mg via INTRAVENOUS
  Filled 2023-09-11: qty 2

## 2023-09-11 MED ORDER — LORAZEPAM 1 MG PO TABS
1.0000 mg | ORAL_TABLET | ORAL | Status: AC | PRN
  Administered 2023-09-11 – 2023-09-12 (×3): 1 mg via ORAL
  Filled 2023-09-11 (×3): qty 1

## 2023-09-11 MED ORDER — SODIUM CHLORIDE 3 % IV SOLN
INTRAVENOUS | Status: DC
  Filled 2023-09-11: qty 500

## 2023-09-11 MED ORDER — LACTATED RINGERS IV SOLN: INTRAVENOUS | Status: DC

## 2023-09-11 MED ORDER — THIAMINE MONONITRATE 100 MG PO TABS
100.0000 mg | ORAL_TABLET | Freq: Every day | ORAL | Status: DC
  Administered 2023-09-11 – 2023-09-14 (×2): 100 mg via ORAL
  Filled 2023-09-11 (×3): qty 1

## 2023-09-11 MED ORDER — ADULT MULTIVITAMIN W/MINERALS CH
1.0000 | ORAL_TABLET | Freq: Every day | ORAL | Status: DC
  Administered 2023-09-11 – 2023-09-14 (×3): 1 via ORAL
  Filled 2023-09-11 (×3): qty 1

## 2023-09-11 MED ORDER — METOPROLOL TARTRATE 5 MG/5ML IV SOLN
5.0000 mg | Freq: Four times a day (QID) | INTRAVENOUS | Status: DC
  Administered 2023-09-11 – 2023-09-12 (×4): 5 mg via INTRAVENOUS
  Filled 2023-09-11 (×4): qty 5

## 2023-09-11 MED ORDER — MAGNESIUM SULFATE 4 GM/100ML IV SOLN
4.0000 g | Freq: Once | INTRAVENOUS | Status: AC
  Administered 2023-09-11: 4 g via INTRAVENOUS
  Filled 2023-09-11: qty 100

## 2023-09-11 NOTE — Assessment & Plan Note (Signed)
 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 The patient will receive metoprolol  5 mg IV q 6 hour for heart rates greater than 90. Will also start eliquis  for stroke prophylaxis.

## 2023-09-11 NOTE — Assessment & Plan Note (Signed)
 Noted. Patient states that she stopped drinking at Thanksgiving of last year. However her presentation is consistent with an active alcoholic in withdrawals.

## 2023-09-11 NOTE — Assessment & Plan Note (Signed)
 Noted. The patient is receiving diazepam  on the CIWA scoring system.

## 2023-09-11 NOTE — Consult Note (Signed)
 MEDICATION RELATED CONSULT NOTE  Pharmacy Consult for Hypertonic Saline Monitoring Indication: Severe hyponatremia  Patient Measurements: Height: 5' 2 (157.5 cm) Weight: 48.2 kg (106 lb 4.2 oz) IBW/kg (Calculated) : 50.1  Vital Signs: Temp: 98.1 F (36.7 C) (07/19 2000) Temp Source: Oral (07/19 2000) BP: 124/57 (07/19 2200) Pulse Rate: 75 (07/19 2200) Intake/Output from previous day: No intake/output data recorded. Intake/Output from this shift: No intake/output data recorded.  Labs: Recent Labs    09/11/23 0822 09/11/23 0823 09/11/23 1200  WBC 10.2  --   --   HGB 12.9  --   --   HCT 34.7*  --   --   PLT 133*  --   --   CREATININE 0.50  --  0.44  MG  --  1.4*  --   ALBUMIN 3.8  --  3.3*  PROT 6.6  --  5.8*  AST 60*  --  56*  ALT 31  --  27  ALKPHOS 95  --  77  BILITOT 2.8*  --  2.3*   Estimated Creatinine Clearance: 56.2 mL/min (by C-G formula based on SCr of 0.44 mg/dL).  Medical History: Past Medical History:  Diagnosis Date   Abdominal aortic atherosclerosis (HCC)    Abnormal weight loss    Alcohol  abuse    Alcohol  abuse with withdrawal (HCC) 10/13/2020   Alcohol  withdrawal (HCC) 11/29/2022   Alcoholic hepatitis 09/09/2020   Suggested by transaminitis noted during ER visit July 16 for intractible  N/V      Alcoholic ketoacidosis 07/12/2014   Noted during both  ER visits (June and July 2022) in the setting of intractable N/V  And history of chronic alcohol  abuse.  No workup was done,  No ETOH level, documentation of last alcohol  ingestion,  or tox screen.  GFR was normal  Urinalysis noted a pH of 5.0.  Review of recrods from May 2016 hospitlaization  note presentations of a severe  metabolic gap acidosis with respiratory compensation (   Allergic rhinitis    Anxiety    Bronchitis    Chicken pox    Colon polyps    COPD (chronic obstructive pulmonary disease) (HCC)    Depression    Dyspnea    Elevated lipase    Fatty liver    Gallbladder mass  09/09/2020   8 x 6 x 6 mm non mobile  mass along GB wall noted on 2018 US  8 x  7 x 5 mm non mobile mass along GB wall noted on July  2022 US    GERD (gastroesophageal reflux disease)    Hepatitis    Hernia of abdominal cavity    Hip fracture (HCC) right   Left upper quadrant abdominal pain    Neuromuscular disorder (HCC)    Neuropathy    lower legs   Thrombocytopenia (HCC)    Wears dentures    full upper and lower    Medications:  3% NaCl to start at 30 cc/hr  Assessment: 63 y/o F with medical history as above presenting from home with weakness and N/V/D. Found to have severe hyponatremia and plan is to start hypertonic saline. Pharmacy to assist with monitoring.   0719 0822 Na 113 0719 1200 Na 114  3%  NaCl started at 30 ml/hr @ 1430  0719 1656 Na 125 3% NaC stopped @ 1825  0719 2128 Na 130  0719 2331 Na 131  - D5W started at 100 ml/hr @ 2335   Goal of Therapy:  Notify physician  if Na > 4 mmol/L in 2 hours or > 6 mmol/L in 4 hours. Goal sodium increase of 8 - 12 mmol/L in 24 hours  Plan:  --Initiate hypertonic saline as ordered --Na checks q2h x 2 and then q4h  Lonnie Rosado D 09/11/2023,10:38 PM

## 2023-09-11 NOTE — Consult Note (Signed)
 MEDICATION RELATED CONSULT NOTE  Pharmacy Consult for Hypertonic Saline Monitoring Indication: Severe hyponatremia  Patient Measurements: Height: 5' 2 (157.5 cm) Weight: 48.1 kg (106 lb) IBW/kg (Calculated) : 50.1  Vital Signs: Temp: 98.2 F (36.8 C) (07/19 0818) Temp Source: Oral (07/19 0818) BP: 129/72 (07/19 1311) Pulse Rate: 104 (07/19 1227) Intake/Output from previous day: No intake/output data recorded. Intake/Output from this shift: Total I/O In: 1000 [IV Piggyback:1000] Out: -   Labs: Recent Labs    09/11/23 0822 09/11/23 0823 09/11/23 1200  WBC 10.2  --   --   HGB 12.9  --   --   HCT 34.7*  --   --   PLT 133*  --   --   CREATININE 0.50  --  0.44  MG  --  1.4*  --   ALBUMIN 3.8  --  3.3*  PROT 6.6  --  5.8*  AST 60*  --  56*  ALT 31  --  27  ALKPHOS 95  --  77  BILITOT 2.8*  --  2.3*   Estimated Creatinine Clearance: 56.1 mL/min (by C-G formula based on SCr of 0.44 mg/dL).  Medical History: Past Medical History:  Diagnosis Date   Abdominal aortic atherosclerosis (HCC)    Abnormal weight loss    Alcohol  abuse    Alcohol  abuse with withdrawal (HCC) 10/13/2020   Alcohol  withdrawal (HCC) 11/29/2022   Alcoholic hepatitis 09/09/2020   Suggested by transaminitis noted during ER visit July 16 for intractible  N/V      Alcoholic ketoacidosis 07/12/2014   Noted during both  ER visits (June and July 2022) in the setting of intractable N/V  And history of chronic alcohol  abuse.  No workup was done,  No ETOH level, documentation of last alcohol  ingestion,  or tox screen.  GFR was normal  Urinalysis noted a pH of 5.0.  Review of recrods from May 2016 hospitlaization  note presentations of a severe  metabolic gap acidosis with respiratory compensation (   Allergic rhinitis    Anxiety    Bronchitis    Chicken pox    Colon polyps    COPD (chronic obstructive pulmonary disease) (HCC)    Depression    Dyspnea    Elevated lipase    Fatty liver    Gallbladder  mass 09/09/2020   8 x 6 x 6 mm non mobile  mass along GB wall noted on 2018 US  8 x  7 x 5 mm non mobile mass along GB wall noted on July  2022 US    GERD (gastroesophageal reflux disease)    Hepatitis    Hernia of abdominal cavity    Hip fracture (HCC) right   Left upper quadrant abdominal pain    Neuromuscular disorder (HCC)    Neuropathy    lower legs   Thrombocytopenia (HCC)    Wears dentures    full upper and lower    Medications:  3% NaCl to start at 30 cc/hr  Assessment: 63 y/o F with medical history as above presenting from home with weakness and N/V/D. Found to have severe hyponatremia and plan is to start hypertonic saline. Pharmacy to assist with monitoring.   0719 0822 Na 113  Goal of Therapy:  Notify physician if Na > 4 mmol/L in 2 hours or > 6 mmol/L in 4 hours. Goal sodium increase of 8 - 12 mmol/L in 24 hours  Plan:  --Initiate hypertonic saline as ordered --Na checks q2h x 2 and then  q4h  Marolyn KATHEE Mare 09/11/2023,1:41 PM

## 2023-09-11 NOTE — Assessment & Plan Note (Signed)
 Supplemented. Will recheck later this evening.

## 2023-09-11 NOTE — Assessment & Plan Note (Signed)
 Uncorrected Ca is 6.7. Corrected is 7.3. Will check ionized calcium  and give 1 gram calcium  gluconate.

## 2023-09-11 NOTE — ED Notes (Signed)
 Assisted NT tech [place] change pt brief, gown, sheets that were wet with urine and applied a purewick to pt at this time. Pt was applied back to the cardiac monitor and call bell is by bedside.

## 2023-09-11 NOTE — ED Triage Notes (Signed)
 Pt BIB [facility] from home c/o weakness and N/V/D the past 2-3 days.  Pt is shaking and trembling on assessment.   144/64 138 CBG 98 Oral temp 97% RA

## 2023-09-11 NOTE — Plan of Care (Signed)

## 2023-09-11 NOTE — Assessment & Plan Note (Signed)
 The patient asserts that her last drink was Thanksgiving of 2024. However, she presents with whole body shaking and severe anxiety. Due to severity of allowing alcohol  withdrawal to go untreated, the patient will be place on CIWA scoring system with valium . The patient states that her last drink was last thanksgiving. She has had nausea and vomiting for 2-3 days. The shaking began this morning.

## 2023-09-11 NOTE — Assessment & Plan Note (Signed)
 4 gm IV given. Will recheck this evening with chemistry.

## 2023-09-11 NOTE — ED Notes (Signed)
 Advised nurse that patient has ready bed

## 2023-09-11 NOTE — Assessment & Plan Note (Addendum)
 Platelets 133. Down from 226 on 04/23/2023. Patient denies recent alcohol  use. Noovert bleeding seen.

## 2023-09-11 NOTE — ED Notes (Signed)
 Hailey RN aware of assigned bed

## 2023-09-11 NOTE — H&P (Signed)
 History and Physical    Patient: Kathy [name] FMW:[id number] DOB: 08/16/1960 DOA: 09/11/2023 DOS: the patient was seen and examined on 09/11/2023 PCP: Marylynn Verneita CROME, MD  Patient coming from: Home  Chief Complaint:  Chief Complaint  Patient presents with   Weakness   Nausea   HPI: [name] is a 63 y.o. female with medical history significant of Pre diabetes, throombocytopenia, alcoholism (patient states that she has not had a drink since 11/20250, ETOH peripheral neuropathy, B 12 deficiency, pernicious anemia, hypokalemia, hepatic steatosis, hyponatremia, hypomagnesemia, and atrophy of left kidney.  The patient presents to [facility] ED on 09/11/2023 after 2-3 days of nausea and vomiting. She is found to have profound hyponatremia of 113. In the ED she was given 1.5 L, but her Na has only come up to 114. The patient also has hypocalcemia, hypomagnesemia, hypokalemia. She is shaking from head to toe. She denies that she has had anything to drink since Thanksgiving 2024.   EKG demonstrates atrial flutter with 3:1 AV block. HR 92. However, I believe getting an accurate heart rate is difficult due to the patient's shaking. She will receive metoprolol  5 mg IV to control rate.   The patient will be admitted to the ICU. Nephrology and critical care have been consulted and she has been started on 3% NS at 30 cc/hr based upon her weight and a likely acute on chronic presentation.    Review of Systems: As mentioned in the history of present illness. All other systems reviewed and are negative. 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  No workup was done,  No ETOH level, documentation of last alcohol  ingestion,  or tox screen.  GFR was normal  Urinalysis noted a pH of 5.0.  Review of recrods from May 2016 hospitlaization  note presentations of a severe  metabolic gap acidosis with respiratory compensation (   Allergic rhinitis    Anxiety    Bronchitis    Chicken pox    Colon polyps    COPD (chronic obstructive pulmonary disease) (HCC)    Depression    Dyspnea    Elevated lipase    Fatty liver    Gallbladder mass 09/09/2020   8 x 6 x 6 mm non mobile  mass along GB wall noted on 2018 US  8 x  7 x 5 mm non mobile mass along GB wall noted on July  2022 US    GERD (gastroesophageal reflux disease)    Hepatitis    Hernia of abdominal cavity    Hip fracture (HCC) right   Left upper quadrant abdominal pain    Neuromuscular disorder (HCC)    Neuropathy    lower legs   Thrombocytopenia (HCC)    Wears dentures    full upper and lower   Past Surgical History:  Procedure Laterality Date   BIOPSY  08/31/2022   Procedure: BIOPSY;  Surgeon: Maryruth Ole DASEN, MD;  Location: [facility] ENDOSCOPY;  Service: Endoscopy;;   CATARACT EXTRACTION W/PHACO Right 07/29/2021  Procedure: CATARACT EXTRACTION PHACO AND INTRAOCULAR LENS PLACEMENT (IOC) RIGHT 19.12 01:27.4;  Surgeon: Jaye Fallow, MD;  Location: [facility];  Service: Ophthalmology;  Laterality: Right;   CATARACT EXTRACTION W/PHACO Left 08/12/2021   Procedure: CATARACT EXTRACTION PHACO AND INTRAOCULAR LENS PLACEMENT (IOC) LEFT 13.10 01:05.3;  Surgeon: Jaye Fallow, MD;  Location: [facility];  Service: Ophthalmology;  Laterality: Left;   CHOLECYSTECTOMY     COLONOSCOPY     COLONOSCOPY WITH PROPOFOL  N/A 08/31/2022   Procedure: COLONOSCOPY WITH PROPOFOL ;  Surgeon: Maryruth Ole DASEN, MD;  Location: [facility] ENDOSCOPY;  Service: Endoscopy;  Laterality: N/A;    ESOPHAGOGASTRODUODENOSCOPY (EGD) WITH PROPOFOL  N/A 08/31/2022   Procedure: ESOPHAGOGASTRODUODENOSCOPY (EGD) WITH PROPOFOL ;  Surgeon: Maryruth Ole DASEN, MD;  Location: [facility] ENDOSCOPY;  Service: Endoscopy;  Laterality: N/A;   EYE SURGERY     POLYPECTOMY  08/31/2022   Procedure: POLYPECTOMY;  Surgeon: Maryruth Ole DASEN, MD;  Location: [facility] ENDOSCOPY;  Service: Endoscopy;;   ROBOTIC ASSISTED LAPAROSCOPIC CHOLECYSTECTOMY  04/16/2021   [facility]   Social History:  reports that she has been smoking cigarettes. She has a 20 pack-year smoking history. She has never used smokeless tobacco. She reports that she does not drink alcohol  and does not use drugs.  Allergies  Allergen Reactions   Azithromycin  Other (See Comments)   Buspirone  Hives   Ciprofloxacin Hives   Lyrica  [Pregabalin ] Other (See Comments)    Blurred vision     Family History  Problem Relation Age of Onset   Hypertension Mother    Osteoporosis Mother    Meniere's disease Mother    Cancer Father 57       prostate   Colon cancer Paternal Grandmother    Alcohol  abuse Other    Breast cancer Neg Hx     Prior to Admission medications   Medication Sig Start Date End Date Taking? Authorizing Provider  gabapentin  (NEURONTIN ) 400 MG capsule Take 2 capsules (800 mg total) by mouth 3 (three) times daily. Patient taking differently: Take 400 mg by mouth 2 (two) times daily. 03/19/23  Yes Marylynn Verneita CROME, MD  omeprazole  (PRILOSEC) 40 MG capsule TAKE ONE CAPSULE BY MOUTH TWO TIMES DAILY 03/19/23  Yes Marylynn Verneita CROME, MD  ondansetron  (ZOFRAN -ODT) 8 MG disintegrating tablet TAKE 1 TABLET BY MOUTH EVERY 8 HOURS AS NEEDED FOR NAUSEA AND VOMITING 05/31/23  Yes Marylynn Verneita CROME, MD  traMADol  (ULTRAM ) 50 MG tablet TAKE ONE TABLET BY MOUTH TWICE DAILY 08/16/23  Yes Tullo, Teresa L, MD  hydrOXYzine  (ATARAX ) 10 MG tablet Take 1 tablet (10 mg total) by mouth 2 (two) times daily as needed. 03/19/23   Marylynn Verneita CROME, MD    Physical Exam: Vitals:   09/11/23  0900 09/11/23 1227 09/11/23 1230 09/11/23 1311  BP: (!) 146/91 114/86 (!) 135/120 129/72  Pulse: 94 (!) 104    Resp: (!) 28  (!) 25 (!) 23  Temp:      TempSrc:      SpO2: 100%     Weight:      Height:       Exam:  Constitutional:  The patient is awake, alert, and oriented x 3. She is in moderate distress from shaking and anxiety. She has a difficult time telling her story. Eyes:  pupils and irises appear normal Normal lids and conjunctivae ENMT:  grossly normal hearing  Lips appear normal external ears, nose appear normal Oropharynx: mucosa, tongue,posterior pharynx appear normal Neck:  neck appears normal, no masses, normal ROM, supple no  thyromegaly Respiratory:  No increased work of breathing. No wheezes, rales, or rhonchi No tactile fremitus Cardiovascular:  Regular rate and rhythm No murmurs, ectopy, or gallups. No lateral PMI. No thrills. Abdomen:  Abdomen is soft, non-tender, non-distended No hernias, masses, or organomegaly Normoactive bowel sounds.  Musculoskeletal:  No cyanosis, clubbing, or edema Skin:  No rashes, lesions, ulcers palpation of skin: no induration or nodules Neurologic:  Unable to evaluate as the patient is shaking from head to toe with low amplitude tremors. She is fully conscious and participatory in exam. Psychiatric:  Mental status The patient is very anxious and somewhat confused.  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 However, she presents with whole body shaking and severe anxiety. Due to severity of allowing alcohol  withdrawal to go untreated, the patient will be place on CIWA scoring system with valium . The patient states that her last drink was last thanksgiving. She has had nausea and vomiting for 2-3 days. The shaking began this morning.  Thrombocytopenia (HCC) Platelets 133. Down  from 226 on 04/23/2023. Patient denies recent alcohol  use. Noovert bleeding seen.  Hyponatremia The patient's sodium is 113 upon presentation to ED. Repeat value is 114. The patient will be admitted to the ICU. She will be placed on a 3% sodium infusion. ICU and nephrology have been consulted.   Tachycardia Likely due to volume depletion and alcohol  withdrawal. She will be on 3% Na and CIWA protocol.  Hypomagnesemia 4 gm IV given. Will recheck this evening with chemistry.  Hypokalemia Supplemented. Will recheck later this evening.  Hypocalcemia Uncorrected Ca is 6.7. Corrected is 7.3. Will check ionized calcium  and give 1 gram calcium  gluconate.  GAD (generalized anxiety disorder) Noted. The patient is receiving diazepam  on the CIWA scoring system.  Fatty liver, alcoholic Noted. Patient states that she stopped drinking at Thanksgiving of last year. However her presentation is consistent with an active alcoholic in withdrawals.  Atrophy of left kidney Noted. Creatinine upon presentation is 0.50. Monitor.  Atrial flutter by electrocardiogram (HCC) With 3:1 AV block. The patient will receive metoprolol  5 mg IV q 6 hour for heart rates greater than 90. Will also start eliquis  for stroke prophylaxis.     Advance Care Planning:   Code Status: Full Code   Consults:  Critical Care Nephrology  Family Communication: None available  Severity of Illness: The appropriate patient status for this patient is INPATIENT. Inpatient status is judged to be reasonable and necessary in order to provide the required intensity of service to ensure the patient's safety. The patient's presenting symptoms, physical exam findings, and initial radiographic and laboratory data in the context of their chronic comorbidities is felt to place them at high risk for further clinical deterioration. Furthermore, it is not anticipated that the patient will be medically stable for discharge from the hospital within 2  midnights of admission.   * I certify that at the point of admission it is my clinical judgment that the patient will require inpatient hospital care spanning beyond 2 midnights from the point of admission due to high intensity of service, high risk for further deterioration and high frequency of surveillance required.*  Author: Orlie Cundari, DO 09/11/2023 1:45 PM  For on call review [email or web address].

## 2023-09-11 NOTE — ED Provider Notes (Signed)
 [facility] Provider Note    Event Date/Time   First MD Initiated Contact with Patient 09/11/23 0820     (approximate)  History   Chief Complaint: Weakness and Nausea  HPI  Kathy [name] is a 63 y.o. female with a past medical history of prior alcohol  abuse in remission since November per patient, anxiety, hepatitis, presents to the emergency department for 2 to 3 days of nausea vomiting and generalized fatigue.  According to the patient for the last few days she has been feeling sick patient describes this as nausea vomiting and generalized fatigue.  Denies any cough or congestion.  No fever.  Patient states mild abdominal cramping but denies any abdominal pain.  Physical Exam   Triage Vital Signs: ED Triage Vitals  Encounter Vitals Group     BP 09/11/23 0818 (!) 149/131     Girls Systolic BP Percentile --      Girls Diastolic BP Percentile --      Boys Systolic BP Percentile --      Boys Diastolic BP Percentile --      Pulse Rate 09/11/23 0818 (!) 107     Resp 09/11/23 0818 20     Temp 09/11/23 0818 98.2 F (36.8 C)     Temp Source 09/11/23 0818 Oral     SpO2 09/11/23 0818 100 %     Weight 09/11/23 0819 106 lb (48.1 kg)     Height 09/11/23 0819 5' 2 (1.575 m)     Head Circumference --      Peak Flow --      Pain Score 09/11/23 0818 8     Pain Loc --      Pain Education --      Exclude from Growth Chart --     Most recent vital signs: Vitals:   09/11/23 0818  BP: (!) 149/131  Pulse: (!) 107  Resp: 20  Temp: 98.2 F (36.8 C)  SpO2: 100%    General: Awake, no distress.  Patient is anxious appearing, tremulous during my exam. CV:  Good peripheral perfusion.  Regular rate and rhythm  Resp:  Normal effort.  Equal breath sounds bilaterally.  Abd:  No distention.  Soft, nontender.  No rebound or guarding.  Benign abdomen  ED Results / Procedures / Treatments   EKG  EKG viewed and interpreted by myself shows a normal sinus  rhythm 89 bpm with a narrow QRS, normal axis, largely normal intervals with slight QTc prolongation, nonspecific ST changes.  MEDICATIONS ORDERED IN ED: Medications  lactated ringers  bolus 1,000 mL (has no administration in time range)  ondansetron  (ZOFRAN ) injection 4 mg (has no administration in time range)  LORazepam  (ATIVAN ) injection 1 mg (has no administration in time range)     IMPRESSION / MDM / ASSESSMENT AND PLAN / ED COURSE  I reviewed the triage vital signs and the nursing notes.  Patient's presentation is most consistent with acute presentation with potential threat to life or bodily function.  Patient presents to the emergency department for 2 to 3 days of nausea vomiting generalized weakness.  Patient is anxious and tremulous during examination.  Patient states she has not had any alcohol  since November 2024.  Given the patient's nausea vomiting and weakness we will check labs, we will IV hydrate, we will treat with Zofran  and Ativan  while awaiting for the results.  Reassuringly patient has a benign abdominal exam.  Patient's labs have resulted showing significant hyponatremia with a  sodium of 114.  CBC is normal.  Respiratory panel negative.  Alcohol  level negative.  Per the patient significant hyponatremia patient has been bolused 500 cc of normal saline we will start the patient on a normal saline infusion and admit to the hospital service for further workup and treatment.  Given the significant hyponatremia patient will need to be monitored in a stepdown/ICU setting.  CRITICAL CARE Performed by: Franky Moores   Total critical care time: 30 minutes  Critical care time was exclusive of separately billable procedures and treating other patients.  Critical care was necessary to treat or prevent imminent or life-threatening deterioration.  Critical care was time spent personally by me on the following activities: development of treatment plan with patient and/or  surrogate as well as nursing, discussions with consultants, evaluation of patient's response to treatment, examination of patient, obtaining history from patient or surrogate, ordering and performing treatments and interventions, ordering and review of laboratory studies, ordering and review of radiographic studies, pulse oximetry and re-evaluation of patient's condition.   FINAL CLINICAL IMPRESSION(S) / ED DIAGNOSES   Nausea vomiting Hyponatremia  Note:  This document was prepared using Dragon voice recognition software and may include unintentional dictation errors.   Moores Franky, MD 09/11/23 1438

## 2023-09-11 NOTE — Assessment & Plan Note (Addendum)
 The patient's sodium is 113 upon presentation to ED. Repeat value is 114. The patient will be admitted to the ICU. She will be placed on a 3% sodium infusion. ICU and nephrology have been consulted.

## 2023-09-11 NOTE — Progress Notes (Signed)
 [facility] CONSULT NOTE    Date: 09/11/2023                  Patient Name:  Kathy [name]  MRN: [id number]  DOB: 1960/03/29  Age / Sex: 63 y.o., female         PCP: Marylynn Verneita CROME, MD                 Service Requesting Consult:                  Reason for Consult: Hyponatremia            History of Present Illness: Patient is a 63 y.o. female with a PMHx of alcohol  abuse history, thrombocytopenia, prediabetes, anemia, fatty liver, atrophic left kidney now admitted with history of 2 to 3 days of nausea vomiting.  She was found to have a sodium of 113.  She was given IV fluids with normal saline per the sodium protocol 114 incidental evaluation.  She was started on IV hypertonic saline 30 cc an hour.  Medications: Outpatient medications: (Not in a hospital admission)   Discontinued Meds:   Medications Discontinued During This Encounter  Medication Reason   0.9 %  sodium chloride  infusion    heparin  injection 5,000 Units     Current medications: Current Facility-Administered Medications  Medication Dose Route Frequency Provider Last Rate Last Admin   apixaban  (ELIQUIS ) tablet 5 mg  5 mg Oral BID Swayze, Ava, DO   5 mg at 09/11/23 1405   [START ON 11/20/2023] denosumab  (PROLIA ) injection 60 mg  60 mg Subcutaneous Q6 months Dineen Rollene MATSU, FNP       LORazepam  (ATIVAN ) tablet 1-4 mg  1-4 mg Oral Q1H PRN Swayze, Ava, DO       Or   diazepam  (VALIUM ) injection 5 mg  5 mg Intravenous Q4H PRN Swayze, Ava, DO   5 mg at 09/11/23 1316   folic acid  (FOLVITE ) tablet 1 mg  1 mg Oral Daily Swayze, Ava, DO   1 mg at 09/11/23 1223   lactated ringers  infusion   Intravenous Continuous Swayze, Ava, DO 75 mL/hr at 09/11/23 1226 New Bag at 09/11/23 1226   metoprolol  tartrate (LOPRESSOR ) injection 5 mg  5 mg Intravenous Q6H Swayze, Ava, DO   5 mg at 09/11/23 1405   multivitamin with minerals tablet 1 tablet  1 tablet Oral Daily Swayze, Ava, DO   1 tablet at  09/11/23 1224   sodium chloride  (hypertonic) 3 % solution   Intravenous Continuous Swayze, Ava, DO 30 mL/hr at 09/11/23 1430 New Bag at 09/11/23 1430   thiamine  (VITAMIN B1) tablet 100 mg  100 mg Oral Daily Swayze, Ava, DO   100 mg at 09/11/23 1224   Or   thiamine  (VITAMIN B1) injection 100 mg  100 mg Intravenous Daily Swayze, Ava, DO       Current Outpatient Medications  Medication Sig Dispense Refill   gabapentin  (NEURONTIN ) 400 MG capsule Take 2 capsules (800 mg total) by mouth 3 (three) times daily. (Patient taking differently: Take 400 mg by mouth 2 (two) times daily.) 180 capsule 2   omeprazole  (PRILOSEC) 40 MG capsule TAKE ONE CAPSULE BY MOUTH TWO TIMES DAILY 180 capsule 1   ondansetron  (ZOFRAN -ODT) 8 MG disintegrating tablet TAKE 1 TABLET BY MOUTH EVERY 8 HOURS AS NEEDED FOR NAUSEA AND VOMITING 30 tablet 2   traMADol  (ULTRAM ) 50 MG tablet TAKE ONE TABLET BY MOUTH TWICE DAILY 60  tablet 0   hydrOXYzine  (ATARAX ) 10 MG tablet Take 1 tablet (10 mg total) by mouth 2 (two) times daily as needed. 180 tablet 0      Allergies: Allergies  Allergen Reactions   Azithromycin  Other (See Comments)   Buspirone  Hives   Ciprofloxacin Hives   Lyrica  [Pregabalin ] Other (See Comments)    Blurred vision       Past Medical History: Past Medical History:  Diagnosis Date   Abdominal aortic atherosclerosis (HCC)    Abnormal weight loss    Alcohol  abuse    Alcohol  abuse with withdrawal (HCC) 10/13/2020   Alcohol  withdrawal (HCC) 11/29/2022   Alcoholic hepatitis 09/09/2020   Suggested by transaminitis noted during ER visit July 16 for intractible  N/V      Alcoholic ketoacidosis 07/12/2014   Noted during both  ER visits (June and July 2022) in the setting of intractable N/V  And history of chronic alcohol  abuse.  No workup was done,  No ETOH level, documentation of last alcohol  ingestion,  or tox screen.  GFR was normal  Urinalysis noted a pH of 5.0.  Review of recrods from May 2016 hospitlaization   note presentations of a severe  metabolic gap acidosis with respiratory compensation (   Allergic rhinitis    Anxiety    Bronchitis    Chicken pox    Colon polyps    COPD (chronic obstructive pulmonary disease) (HCC)    Depression    Dyspnea    Elevated lipase    Fatty liver    Gallbladder mass 09/09/2020   8 x 6 x 6 mm non mobile  mass along GB wall noted on 2018 US  8 x  7 x 5 mm non mobile mass along GB wall noted on July  2022 US    GERD (gastroesophageal reflux disease)    Hepatitis    Hernia of abdominal cavity    Hip fracture (HCC) right   Left upper quadrant abdominal pain    Neuromuscular disorder (HCC)    Neuropathy    lower legs   Thrombocytopenia (HCC)    Wears dentures    full upper and lower     Past Surgical History: Past Surgical History:  Procedure Laterality Date   BIOPSY  08/31/2022   Procedure: BIOPSY;  Surgeon: Maryruth Ole DASEN, MD;  Location: [facility] ENDOSCOPY;  Service: Endoscopy;;   CATARACT EXTRACTION W/PHACO Right 07/29/2021   Procedure: CATARACT EXTRACTION PHACO AND INTRAOCULAR LENS PLACEMENT (IOC) RIGHT 19.12 01:27.4;  Surgeon: Jaye Fallow, MD;  Location: [facility];  Service: Ophthalmology;  Laterality: Right;   CATARACT EXTRACTION W/PHACO Left 08/12/2021   Procedure: CATARACT EXTRACTION PHACO AND INTRAOCULAR LENS PLACEMENT (IOC) LEFT 13.10 01:05.3;  Surgeon: Jaye Fallow, MD;  Location: [facility];  Service: Ophthalmology;  Laterality: Left;   CHOLECYSTECTOMY     COLONOSCOPY     COLONOSCOPY WITH PROPOFOL  N/A 08/31/2022   Procedure: COLONOSCOPY WITH PROPOFOL ;  Surgeon: Maryruth Ole DASEN, MD;  Location: [facility] ENDOSCOPY;  Service: Endoscopy;  Laterality: N/A;   ESOPHAGOGASTRODUODENOSCOPY (EGD) WITH PROPOFOL  N/A 08/31/2022   Procedure: ESOPHAGOGASTRODUODENOSCOPY (EGD) WITH PROPOFOL ;  Surgeon: Maryruth Ole DASEN, MD;  Location: [facility] ENDOSCOPY;  Service: Endoscopy;  Laterality: N/A;   EYE SURGERY     POLYPECTOMY  08/31/2022    Procedure: POLYPECTOMY;  Surgeon: Maryruth Ole DASEN, MD;  Location: [facility] END[facility];  Service: Endoscopy;;   ROBOTIC ASSISTED LAPAROSCOPIC CHOLECYSTECTOMY  04/16/2021   [facility]     Family History: Family History  Problem Relation  Age of Onset   Hypertension Mother    Osteoporosis Mother    Meniere's disease Mother    Cancer Father 34       prostate   Colon cancer Paternal Grandmother    Alcohol  abuse Other    Breast cancer Neg Hx      Social History: Social History   Socioeconomic History   Marital status: Divorced    Spouse name: Not on file   Number of children: Not on file   Years of education: Not on file   Highest education level: Not on file  Occupational History   Not on file  Tobacco Use   Smoking status: Every Day    Current packs/day: 0.50    Average packs/day: 0.5 packs/day for 40.0 years (20.0 ttl pk-yrs)    Types: Cigarettes   Smokeless tobacco: Never   Tobacco comments:    Started smoking age 57.  Quit during pregnancies.  Vaping Use   Vaping status: Never Used  Substance and Sexual Activity   Alcohol  use: Never    Alcohol /week: 42.0 standard drinks of alcohol     Types: 42 Glasses of wine per week    Comment: 07/23/21 - Pt reports clean for 5 yrs.  now drinks approx 6 drinks/wk   Drug use: No   Sexual activity: Not Currently  Other Topics Concern   Not on file  Social History Narrative   Not on file   Social Drivers of Health   Financial Resource Strain: Low Risk  (09/29/2022)   Overall Financial Resource Strain (CARDIA)    Difficulty of Paying Living Expenses: Not hard at all  Food Insecurity: No Food Insecurity (11/28/2022)   Hunger Vital Sign    Worried About Running Out of Food in the Last Year: Never true    Ran Out of Food in the Last Year: Never true  Transportation Needs: No Transportation Needs (11/28/2022)   PRAPARE - Administrator, Civil Service (Medical): No    Lack of Transportation (Non-Medical): No  Physical  Activity: Insufficiently Active (09/29/2022)   Exercise Vital Sign    Days of Exercise per Week: 5 days    Minutes of Exercise per Session: 20 min  Stress: No Stress Concern Present (09/29/2022)   [employer] of Occupational Health - Occupational Stress Questionnaire    Feeling of Stress : Not at all  Social Connections: Moderately Isolated (09/29/2022)   Social Connection and Isolation Panel    Frequency of Communication with Friends and Family: More than three times a week    Frequency of Social Gatherings with Friends and Family: More than three times a week    Attends Religious Services: More than 4 times per year    Active Member of [employer] or Organizations: No    Attends Banker Meetings: Never    Marital Status: Divorced  Catering manager Violence: Not At Risk (11/28/2022)   Humiliation, Afraid, Rape, and Kick questionnaire    Fear of Current or Ex-Partner: No    Emotionally Abused: No    Physically Abused: No    Sexually Abused: No     Review of Systems: As per HPI  Vital Signs: Blood pressure (!) 133/96, pulse 77, temperature 98.6 F (37 C), temperature source Oral, resp. rate 18, height 5' 2 (1.575 m), weight 48.1 kg, SpO2 100%.  Weight trends: Filed Weights   09/11/23 0819  Weight: 48.1 kg    Physical Exam: Physical Exam: General:  No acute distress  Head:  Normocephalic, atraumatic. Moist oral mucosal membranes  Eyes:  Anicteric  Neck:  Supple  Lungs:   Clear to auscultation, normal effort  Heart:  S1S2 no rubs  Abdomen:   Soft, nontender, bowel sounds present  Extremities:  peripheral edema.  Neurologic:  Awake, alert, following commands  Skin:  No lesions  Access:     Lab results:  Basic Metabolic Panel: Recent Labs  Lab 09/11/23 0822 09/11/23 0823 09/11/23 1200  NA 113*  --  114*  K 3.3*  --  3.2*  CL 77*  --  81*  CO2 18*  --  20*  GLUCOSE 140*  --  115*  BUN 6*  --  <5*  CREATININE 0.50  --  0.44  CALCIUM  7.2*  --  6.7*   MG  --  1.4*  --     Creatinine, Ser  Date/Time Value Ref Range Status  09/11/2023 12:00 PM 0.44 0.44 - 1.00 mg/dL Final  92/80/7974 91:77 AM 0.50 0.44 - 1.00 mg/dL Final  97/71/7974 89:92 AM 0.70 0.40 - 1.20 mg/dL Final  87/93/7975 88:68 AM 0.51 0.40 - 1.20 mg/dL Final  89/74/7975 88:47 AM 0.42 0.40 - 1.20 mg/dL Final  89/85/7975 95:61 AM 0.42 (L) 0.44 - 1.00 mg/dL Final  89/86/7975 94:57 AM 0.52 0.44 - 1.00 mg/dL Final  89/87/7975 93:44 AM 0.43 (L) 0.44 - 1.00 mg/dL Final  89/88/7975 94:48 AM 0.41 (L) 0.44 - 1.00 mg/dL Final  89/89/7975 95:96 AM 0.44 0.44 - 1.00 mg/dL Final  89/90/7975 96:69 AM 0.45 0.44 - 1.00 mg/dL Final  89/91/7975 95:96 AM 0.43 (L) 0.44 - 1.00 mg/dL Final  89/92/7975 91:76 AM 0.43 (L) 0.44 - 1.00 mg/dL Final  89/93/7975 96:62 AM 0.56 0.44 - 1.00 mg/dL Final  89/94/7975 94:70 PM 0.45 0.44 - 1.00 mg/dL Final  89/94/7975 91:43 AM 0.53 0.44 - 1.00 mg/dL Final  94/83/7975 88:44 AM 0.49 0.40 - 1.20 mg/dL Final  97/83/7975 97:97 PM 0.47 0.44 - 1.00 mg/dL Final  97/83/7975 96:81 AM 0.72 0.44 - 1.00 mg/dL Final  98/71/7975 91:49 AM 0.64 0.44 - 1.00 mg/dL Final  90/98/7976 89:67 AM 0.53 0.40 - 1.20 mg/dL Final  91/87/7976 91:53 AM 0.90 0.44 - 1.00 mg/dL Final  95/97/7976 95:94 PM 0.60 0.44 - 1.00 mg/dL Final  95/97/7976 88:49 AM 0.59 0.44 - 1.00 mg/dL Final  96/76/7976 89:97 AM 0.68 0.44 - 1.00 mg/dL Final  96/83/7976 98:98 PM 0.60 0.40 - 1.20 mg/dL Final  97/77/7976 90:40 AM 0.70 0.44 - 1.00 mg/dL Final  97/79/7976 90:64 AM 0.72 0.44 - 1.00 mg/dL Final  98/71/7976 88:93 PM 0.65 0.44 - 1.00 mg/dL Final  91/77/7977 92:80 AM 0.44 0.44 - 1.00 mg/dL Final  91/78/7977 93:90 PM 0.87 0.44 - 1.00 mg/dL Final  91/93/7977 95:71 PM 0.71 0.44 - 1.00 mg/dL Final  92/83/7977 93:79 AM 0.85 0.44 - 1.00 mg/dL Final  93/76/7977 91:94 PM 0.96 0.44 - 1.00 mg/dL Final  96/71/7977 96:42 PM 0.49 0.40 - 1.20 mg/dL Final  98/86/7977 89:50 AM 0.57 0.40 - 1.20 mg/dL Final  87/72/7978  95:65 PM 0.72 0.40 - 1.20 mg/dL Final  95/71/7978 97:41 PM 0.63 0.40 - 1.20 mg/dL Final  91/89/7979 91:58 AM 0.70 0.57 - 1.00 mg/dL Final  93/81/7979 96:97 PM 0.61 0.57 - 1.00 mg/dL Final  87/69/7980 89:64 AM 0.64 0.40 - 1.20 mg/dL Final  92/77/7980 97:43 PM 0.68 0.40 - 1.20 mg/dL Final  97/88/7980 95:58 PM 0.68 0.40 - 1.20 mg/dL Final  91/77/7981 96:86 PM 0.64 0.40 - 1.20  mg/dL Final  95/81/7981 97:91 PM 0.56 0.40 - 1.20 mg/dL Final  87/86/7982 97:87 PM 0.55 0.40 - 1.20 mg/dL Final  88/93/7982 88:66 AM 0.49 0.40 - 1.20 mg/dL Final  92/75/7982 95:44 AM 0.49 0.44 - 1.00 mg/dL Final  92/76/7982 90:48 AM 0.53 0.44 - 1.00 mg/dL Final  92/77/7982 94:67 AM 0.79 0.44 - 1.00 mg/dL Final  92/78/7982 93:82 AM 0.49 0.44 - 1.00 mg/dL Final  92/79/7982 94:99 AM 0.38 (L) 0.44 - 1.00 mg/dL Final    CBC: Recent Labs  Lab 09/11/23 0822  WBC 10.2  HGB 12.9  HCT 34.7*  MCV 86.5  PLT 133*    Microbiology: Results for orders placed or performed during the hospital encounter of 09/11/23  Resp panel by RT-PCR (RSV, Flu A&B, Covid) Anterior Nasal Swab     Status: None   Collection Time: 09/11/23  9:32 AM   Specimen: Anterior Nasal Swab  Result Value Ref Range Status   SARS Coronavirus 2 by RT PCR NEGATIVE NEGATIVE Final    Comment: (NOTE) SARS-CoV-2 target nucleic acids are NOT DETECTED.  The SARS-CoV-2 RNA is generally detectable in upper respiratory specimens during the acute phase of infection. The lowest concentration of SARS-CoV-2 viral copies this assay can detect is 138 copies/mL. A negative result does not preclude SARS-Cov-2 infection and should not be used as the sole basis for treatment or other patient management decisions. A negative result may occur with  improper specimen collection/handling, submission of specimen other than nasopharyngeal swab, presence of viral mutation(s) within the areas targeted by this assay, and inadequate number of viral copies(<138 copies/mL). A  negative result must be combined with clinical observations, patient history, and epidemiological information. The expected result is Negative.  Fact Sheet for Patients:  BloggerCourse.com  Fact Sheet for Healthcare Providers:  SeriousBroker.it  This test is no t yet approved or cleared by the [place]  FDA and  has been authorized for detection and/or diagnosis of SARS-CoV-2 by FDA under an Emergency Use Authorization (EUA). This EUA will remain  in effect (meaning this test can be used) for the duration of the COVID-19 declaration under Section 564(b)(1) of the Act, 21 U.S.C.section 360bbb-3(b)(1), unless the authorization is terminated  or revoked sooner.       Influenza A by PCR NEGATIVE NEGATIVE Final   Influenza B by PCR NEGATIVE NEGATIVE Final    Comment: (NOTE) The Xpert Xpress SARS-CoV-2/FLU/RSV plus assay is intended as an aid in the diagnosis of influenza from Nasopharyngeal swab specimens and should not be used as a sole basis for treatment. Nasal washings and aspirates are unacceptable for Xpert Xpress SARS-CoV-2/FLU/RSV testing.  Fact Sheet for Patients: BloggerCourse.com  Fact Sheet for Healthcare Providers: SeriousBroker.it  This test is not yet approved or cleared by the [place]  FDA and has been authorized for detection and/or diagnosis of SARS-CoV-2 by FDA under an Emergency Use Authorization (EUA). This EUA will remain in effect (meaning this test can be used) for the duration of the COVID-19 declaration under Section 564(b)(1) of the Act, 21 U.S.C. section 360bbb-3(b)(1), unless the authorization is terminated or revoked.     Resp Syncytial Virus by PCR NEGATIVE NEGATIVE Final    Comment: (NOTE) Fact Sheet for Patients: BloggerCourse.com  Fact Sheet for Healthcare  Providers: SeriousBroker.it  This test is not yet approved or cleared by the [place]  FDA and has been authorized for detection and/or diagnosis of SARS-CoV-2 by FDA under an Emergency Use Authorization (EUA). This EUA will remain in effect (  meaning this test can be used) for the duration of the COVID-19 declaration under Section 564(b)(1) of the Act, 21 U.S.C. section 360bbb-3(b)(1), unless the authorization is terminated or revoked.  Performed at [facility], [address] [place], [place] [address]     Urinalysis: Recent Labs    09/11/23 1440  COLORURINE STRAW*  LABSPEC 1.001*  PHURINE 7.0  GLUCOSEU NEGATIVE  HGBUR SMALL*  BILIRUBINUR NEGATIVE  KETONESUR 5*  PROTEINUR NEGATIVE  NITRITE NEGATIVE  LEUKOCYTESUR NEGATIVE     Imaging:  No results found.   Assessment & Plan:  63 y.o. female with a PMHx of alcohol  abuse history, thrombocytopenia, prediabetes, anemia, fatty liver, atrophic left kidney now admitted with history of 2 to 3 days of nausea vomiting.  She was found to have a sodium of 113.  She was given IV fluids with normal saline per the sodium protocol 114 incidental evaluation.  She was started on IV hypertonic saline 30 cc an hour.   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  Will check urine and serum osmolality and also urine lites.  I agree with hypertonic saline at 30 cc an hour.  Will monitor sodium levels every 2 hours.  Will continue hypertonic saline until sodium improves over 125.  Avoid "Ringer's"  lactate secondary to  hypotonicity.      #2 hypokalemia: This is most likely secondary to vomiting complicated by alcohol  use.  Supplement potassium.  #3: Hypomagnesemia: Most likely secondary to alcohol  abuse.  #4: Alcohol  withdrawal protocol: Patient is on withdrawal observation.  Will follow closely. Labs and medications reviewed.  LOS: 0 Pinkey Edman, MD [facility]. 7/19/20254:01 PM

## 2023-09-11 NOTE — Assessment & Plan Note (Signed)
 Noted. Creatinine upon presentation is 0.50. Monitor.

## 2023-09-11 NOTE — Assessment & Plan Note (Signed)
 Likely due to volume depletion and alcohol  withdrawal. She will be on 3% Na and CIWA protocol.

## 2023-09-11 NOTE — ED Notes (Signed)
 Fall bundle implemented. Pt changed into gown. Another Iv placed.

## 2023-09-11 NOTE — ED Notes (Signed)
 This RN called lab to gather labs

## 2023-09-11 NOTE — ED Notes (Signed)
 Stat repeat sodium sent to lab.

## 2023-09-11 NOTE — Consult Note (Signed)
 NAME:  Kathy [name], MRN:  [id number], DOB:  Dec 15, 1960, LOS: 0 ADMISSION DATE:  09/11/2023, CONSULTATION DATE: 09/11/2023 REFERRING MD: Dr. Soledad, CHIEF COMPLAINT: Nausea/Vomiting/Diarrhea    History of Present Illness:  This is a 63 yo female with a PMH of ETOH abuse, alcoholic hepatitis, COPD, and thrombocytopenia.  She presented to [facility] ER on 07/19 via EMS from home with a 2-3 day hx of weakness, nausea, vomiting, and diarrhea.  She also endorsed mild abdominal cramping.    ED Course In the ER significant lab results were: Na+ 113/K+ 3.3/chloride 77/CO2 18/glucose 140/calcium  7.2/anion gap 18/magnesium  1.4/AST 60/total bilirubin 2.8/platelets 133.  Pt tremulous and anxious per EDP notes.  Pt states she has been sober since Nov. 2024 and denies current ETOH abuse.  Medications received in ED: 4 mg of iv zofran /40 meq po K+/500 ml NS bolus/2.5 mg iv valium .  Pt admitted to ICU per hospitalist team and started on hypertonic saline.  PCCM team consulted to assist with management.   Pertinent  Medical History  Abdominal Aortic Atherosclerosis  ETOH Abuse  Alcoholic Ketoacidosis  Alcoholic Hepatitis  Allergic Rhinitis  Bronchitis  Anxiety  COPD  Depression  Gallbladder Mass GERD Hip Fracture  Neuropathy  Thrombocytopenia   Significant Hospital Events: Including procedures, antibiotic start and stop dates in addition to other pertinent events   07/19: Admitted with nausea/vomiting/diarrhea and hyponatremia requiring hypertonic saline   Interim History / Subjective:  Pt resting comfortably in bed states her nausea has improved currently on RA vital signs stable   Objective    Blood pressure 129/72, pulse (!) 104, temperature 98.2 F (36.8 C), temperature source Oral, resp. rate (!) 23, height 5' 2 (1.575 m), weight 48.1 kg, SpO2 100%.        Intake/Output Summary (Last 24 hours) at 09/11/2023 1328 Last data filed at 09/11/2023 0954 Gross per 24 hour  Intake 1000 ml   Output --  Net 1000 ml   Filed Weights   09/11/23 0819  Weight: 48.1 kg    Examination: General: Well-developed, well-nourished female, NAD on RA  HENT: Supple, no JVD Lungs: Clear throughout, even, non labored  Cardiovascular: Atrial flutter, s1s2, no m/r/g, 2+ radial/2+ distal pulses, no edema  Abdomen: +BS x4, soft, non tender, non distended  Extremities: Normal bulk and tone, moves all extremities  Neuro: Alert and oriented, follows commands, PERRLA, mild tremors  GU: Voiding   Resolved problem list   Assessment and Plan   #ETOH abuse hx (states she has been sober since Nov 2024) #Anxiety/Tremors  Hx: Depression and neuropathy  - Per hospitalist team CIWA protocol initiated  - Continue thiamine , folic acid , and mvi  - Urine drug screen pending  - Neuro checks q4hrs   #Atrial flutter~rate controlled   - Continuous telemetry monitoring - Consider changing scheduled iv metoprolol  to prn  - Echo and troponin pending   #Hyponatremia  #Hypokalemia  #Hypomagnesia #Hypocalcemia  #Metabolic acidosis   - Serial Na+ levels  - Trend BMP  - Replace electrolytes as indicated  - Strict I&O's - Urine sodium, serum osmolality, and urine osmolality pending  - Nephrology consulted appreciate input: hypertonic saline per recommendations  - Seizure precautions   #Alcoholic hepatitis  - Trend hepatic function panel - Avoid hepatoxic agents as able - Pt reports she has sustained for ETOH since Nov 2024   Best Practice (right click and Reselect all SmartList Selections daily)   Diet/type: clear liquids DVT prophylaxis Apixaban  per hospitalist service  Pressure  ulcer(s): N/A GI prophylaxis: N/A Lines: N/A Foley:  N/A Code Status:  full code Last date of multidisciplinary goals of care discussion [N/A]  Discussed plan of care with pt and all questions were answered  Labs   CBC: Recent Labs  Lab 09/11/23 0822  WBC 10.2  HGB 12.9  HCT 34.7*  MCV 86.5  PLT 133*     Basic Metabolic Panel: Recent Labs  Lab 09/11/23 0822 09/11/23 0823 09/11/23 1200  NA 113*  --  114*  K 3.3*  --  3.2*  CL 77*  --  81*  CO2 18*  --  20*  GLUCOSE 140*  --  115*  BUN 6*  --  <5*  CREATININE 0.50  --  0.44  CALCIUM  7.2*  --  6.7*  MG  --  1.4*  --    GFR: Estimated Creatinine Clearance: 56.1 mL/min (by C-G formula based on SCr of 0.44 mg/dL). Recent Labs  Lab 09/11/23 0822  WBC 10.2    Liver Function Tests: Recent Labs  Lab 09/11/23 0822 09/11/23 1200  AST 60* 56*  ALT 31 27  ALKPHOS 95 77  BILITOT 2.8* 2.3*  PROT 6.6 5.8*  ALBUMIN 3.8 3.3*   Recent Labs  Lab 09/11/23 0822  LIPASE 29   No results for input(s): AMMONIA in the last 168 hours.  ABG    Component Value Date/Time   PHART 7.52 (H) 07/14/2014 1055   PCO2ART 33 07/14/2014 1055   PO2ART 69 (L) 07/14/2014 1055   HCO3 26.9 07/14/2014 1055   TCO2 24 04/16/2021 0959   ACIDBASEDEF 17.8 (H) 07/13/2014 1108   O2SAT 95.4 07/14/2014 1055     Coagulation Profile: No results for input(s): INR, PROTIME in the last 168 hours.  Cardiac Enzymes: No results for input(s): CKTOTAL, CKMB, CKMBINDEX, TROPONINI in the last 168 hours.  HbA1C: Hemoglobin A1C  Date/Time Value Ref Range Status  03/19/2023 12:25 PM 6.3 (A) 4.0 - 5.6 % Final  05/28/2021 09:43 AM 4.7 4.0 - 5.6 % Final   Hgb A1c MFr Bld  Date/Time Value Ref Range Status  10/03/2018 08:41 AM 5.4 4.8 - 5.6 % Final    Comment:             Prediabetes: 5.7 - 6.4          Diabetes: >6.4          Glycemic control for adults with diabetes: <7.0   02/21/2018 10:35 AM 5.9 4.6 - 6.5 % Final    Comment:    Glycemic Control Guidelines for People with Diabetes:Non Diabetic:  <6%Goal of Therapy: <7%Additional Action Suggested:  >8%     CBG: No results for input(s): GLUCAP in the last 168 hours.  Review of Systems: Positives in BOLD   Gen: Denies fever, chills, weight change, fatigue, night sweats HEENT: Denies  blurred vision, double vision, hearing loss, tinnitus, sinus congestion, rhinorrhea, sore throat, neck stiffness, dysphagia PULM: Denies shortness of breath, cough, sputum production, hemoptysis, wheezing CV: Denies chest pain, edema, orthopnea, paroxysmal nocturnal dyspnea, palpitations GI: abdominal pain, nausea, vomiting, diarrhea, hematochezia, melena, constipation, change in bowel habits GU: Denies dysuria, hematuria, polyuria, oliguria, urethral discharge Endocrine: Denies hot or cold intolerance, polyuria, polyphagia or appetite change Derm: Denies rash, dry skin, scaling or peeling skin change Heme: Denies easy bruising, bleeding, bleeding gums Neuro: Denies headache, numbness, weakness, slurred speech, loss of memory or consciousness   Past Medical History:  She,  has a past medical history of Abdominal aortic  atherosclerosis (HCC), Abnormal weight loss, Alcohol  abuse, Alcohol  abuse with withdrawal (HCC) (10/13/2020), Alcohol  withdrawal (HCC) (11/29/2022), Alcoholic hepatitis (09/09/2020), Alcoholic ketoacidosis (07/12/2014), Allergic rhinitis, Anxiety, Bronchitis, Chicken pox, Colon polyps, COPD (chronic obstructive pulmonary disease) (HCC), Depression, Dyspnea, Elevated lipase, Fatty liver, Gallbladder mass (09/09/2020), GERD (gastroesophageal reflux disease), Hepatitis, Hernia of abdominal cavity, Hip fracture (HCC) (right), Left upper quadrant abdominal pain, Neuromuscular disorder (HCC), Neuropathy, Thrombocytopenia (HCC), and Wears dentures.   Surgical History:   Past Surgical History:  Procedure Laterality Date   BIOPSY  08/31/2022   Procedure: BIOPSY;  Surgeon: Maryruth Ole DASEN, MD;  Location: [facility] ENDOSCOPY;  Service: Endoscopy;;   CATARACT EXTRACTION W/PHACO Right 07/29/2021   Procedure: CATARACT EXTRACTION PHACO AND INTRAOCULAR LENS PLACEMENT (IOC) RIGHT 19.12 01:27.4;  Surgeon: Jaye Fallow, MD;  Location: [facility]GERY [facility];  Service: Ophthalmology;  Laterality:  Right;   CATARACT EXTRACTION W/PHACO Left 08/12/2021   Procedure: CATARACT EXTRACTION PHACO AND INTRAOCULAR LENS PLACEMENT (IOC) LEFT 13.10 01:05.3;  Surgeon: Jaye Fallow, MD;  Location: [facility]ERY [facility];  Service: Ophthalmology;  Laterality: Left;   CHOLECYSTECTOMY     COLONOSCOPY     COLONOSCOPY WITH PROPOFOL  N/A 08/31/2022   Procedure: COLONOSCOPY WITH PROPOFOL ;  Surgeon: Maryruth Ole DASEN, MD;  Location: [facility] ENDOSCOPY;  Service: Endoscopy;  Laterality: N/A;   ESOPHAGOGASTRODUODENOSCOPY (EGD) WITH PROPOFOL  N/A 08/31/2022   Procedure: ESOPHAGOGASTRODUODENOSCOPY (EGD) WITH PROPOFOL ;  Surgeon: Maryruth Ole DASEN, MD;  Location: [facility] ENDOSCOPY;  Service: Endoscopy;  Laterality: N/A;   EYE SURGERY     POLYPECTOMY  08/31/2022   Procedure: POLYPECTOMY;  Surgeon: Maryruth Ole DASEN, MD;  Location: [facility] ENDOSCOPY;  Service: Endoscopy;;   ROBOTIC ASSISTED LAPAROSCOPIC CHOLECYSTECTOMY  04/16/2021   [facility]     Social History:   reports that she has been smoking cigarettes. She has a 20 pack-year smoking history. She has never used smokeless tobacco. She reports that she does not drink alcohol  and does not use drugs.   Family History:  Her family history includes Alcohol  abuse in an other family member; Cancer (age of onset: 43) in her father; Colon cancer in her paternal grandmother; Hypertension in her mother; Meniere's disease in her mother; Osteoporosis in her mother. There is no history of Breast cancer.   Allergies Allergies  Allergen Reactions   Azithromycin  Other (See Comments)   Buspirone  Hives   Ciprofloxacin Hives   Lyrica  [Pregabalin ] Other (See Comments)    Blurred vision      Home Medications  Prior to Admission medications   Medication Sig Start Date End Date Taking? Authorizing Provider  gabapentin  (NEURONTIN ) 400 MG capsule Take 2 capsules (800 mg total) by mouth 3 (three) times daily. Patient taking differently: Take 400 mg by mouth 2 (two) times daily. 03/19/23  Yes  Marylynn Verneita CROME, MD  omeprazole  (PRILOSEC) 40 MG capsule TAKE ONE CAPSULE BY MOUTH TWO TIMES DAILY 03/19/23  Yes Marylynn Verneita CROME, MD  ondansetron  (ZOFRAN -ODT) 8 MG disintegrating tablet TAKE 1 TABLET BY MOUTH EVERY 8 HOURS AS NEEDED FOR NAUSEA AND VOMITING 05/31/23  Yes Marylynn Verneita CROME, MD  traMADol  (ULTRAM ) 50 MG tablet TAKE ONE TABLET BY MOUTH TWICE DAILY 08/16/23  Yes Tullo, Teresa L, MD  hydrOXYzine  (ATARAX ) 10 MG tablet Take 1 tablet (10 mg total) by mouth 2 (two) times daily as needed. 03/19/23   Marylynn Verneita CROME, MD     Critical care time: 60 minutes       Lonell Moose, [facility]  Pulmonary/Critical Care Pager [phone] (please enter 7 digits)  PCCM Consult Pager [phone] (please enter 7 digits)

## 2023-09-12 ENCOUNTER — Inpatient Hospital Stay
Admit: 2023-09-12 | Discharge: 2023-09-12 | Disposition: A | Discharge status: Home / Self Care | Attending: Critical Care Medicine | Admitting: Critical Care Medicine

## 2023-09-12 LAB — ECHOCARDIOGRAM COMPLETE
AR max vel: 2.16 cm2
AV Peak grad: 3.1 mmHg
Ao pk vel: 0.88 m/s
Area-P 1/2: 2.92 cm2
Height: 62 in
S' Lateral: 2.2 cm
Weight: 1700.19 [oz_av]

## 2023-09-12 LAB — SODIUM
Sodium: 120 mmol/L — ABNORMAL LOW (ref 135–145)
Sodium: 122 mmol/L — ABNORMAL LOW (ref 135–145)
Sodium: 122 mmol/L — ABNORMAL LOW (ref 135–145)
Sodium: 123 mmol/L — ABNORMAL LOW (ref 135–145)
Sodium: 125 mmol/L — ABNORMAL LOW (ref 135–145)
Sodium: 131 mmol/L — ABNORMAL LOW (ref 135–145)

## 2023-09-12 LAB — COMPREHENSIVE METABOLIC PANEL WITH GFR
ALT: 24 U/L (ref 0–44)
AST: 47 U/L — ABNORMAL HIGH (ref 15–41)
Albumin: 3.6 g/dL (ref 3.5–5.0)
Alkaline Phosphatase: 83 U/L (ref 38–126)
Anion gap: 9 (ref 5–15)
BUN: <5 mg/dL — ABNORMAL LOW (ref 8–23)
CO2: 24 mmol/L (ref 22–32)
Calcium: 7.9 mg/dL — ABNORMAL LOW (ref 8.9–10.3)
Chloride: 98 mmol/L (ref 98–111)
Creatinine, Ser: 0.49 mg/dL (ref 0.44–1.00)
GFR, Estimated: >60 mL/min (ref >60)
Glucose, Bld: 144 mg/dL — ABNORMAL HIGH (ref 70–99)
Potassium: 2.7 mmol/L — CL (ref 3.5–5.1)
Sodium: 131 mmol/L — ABNORMAL LOW (ref 135–145)
Total Bilirubin: 1.3 mg/dL — ABNORMAL HIGH (ref 0.0–1.2)
Total Protein: 6.8 g/dL (ref 6.5–8.1)

## 2023-09-12 LAB — CBC
HCT: 34.4 % — ABNORMAL LOW (ref 36.0–46.0)
Hemoglobin: 12.7 g/dL (ref 12.0–15.0)
MCH: 32.6 pg (ref 26.0–34.0)
MCHC: 36.9 g/dL — ABNORMAL HIGH (ref 30.0–36.0)
MCV: 88.2 fL (ref 80.0–100.0)
Platelets: 113 K/uL — ABNORMAL LOW (ref 150–400)
RBC: 3.9 MIL/uL (ref 3.87–5.11)
RDW: 12.9 % (ref 11.5–15.5)
WBC: 6 K/uL (ref 4.0–10.5)
nRBC: 0 % (ref 0.0–0.2)

## 2023-09-12 LAB — MAGNESIUM: Magnesium: 2 mg/dL (ref 1.7–2.4)

## 2023-09-12 LAB — POTASSIUM: Potassium: 4.1 mmol/L (ref 3.5–5.1)

## 2023-09-12 MED ORDER — HYDROXYZINE HCL 10 MG PO TABS
10.0000 mg | ORAL_TABLET | Freq: Two times a day (BID) | ORAL | Status: DC | PRN
  Administered 2023-09-13 – 2023-09-14 (×3): 10 mg via ORAL
  Filled 2023-09-12 (×5): qty 1

## 2023-09-12 MED ORDER — PHENOBARBITAL SODIUM 130 MG/ML IJ SOLN: 97.5000 mg | Freq: Three times a day (TID) | INTRAMUSCULAR | Status: DC

## 2023-09-12 MED ORDER — SODIUM CHLORIDE 0.9 % IV SOLN
260.0000 mg | Freq: Once | INTRAVENOUS | Status: DC
  Filled 2023-09-12: qty 2

## 2023-09-12 MED ORDER — NICOTINE 21 MG/24HR TD PT24
21.0000 mg | MEDICATED_PATCH | Freq: Every day | TRANSDERMAL | Status: DC
  Administered 2023-09-12: 21 mg via TRANSDERMAL
  Filled 2023-09-12 (×2): qty 1

## 2023-09-12 MED ORDER — DESMOPRESSIN ACETATE 4 MCG/ML IJ SOLN
1.0000 ug | Freq: Once | INTRAMUSCULAR | Status: AC
  Administered 2023-09-12: 1 ug via INTRAVENOUS
  Filled 2023-09-12: qty 1

## 2023-09-12 MED ORDER — SODIUM CHLORIDE 0.9 % IV SOLN: INTRAVENOUS | Status: DC

## 2023-09-12 MED ORDER — ONDANSETRON 4 MG PO TBDP
4.0000 mg | ORAL_TABLET | Freq: Three times a day (TID) | ORAL | Status: DC | PRN
  Administered 2023-09-12 – 2023-09-13 (×2): 4 mg via ORAL
  Filled 2023-09-12 (×3): qty 1

## 2023-09-12 MED ORDER — PHENOBARBITAL SODIUM 130 MG/ML IJ SOLN: 65.0000 mg | Freq: Three times a day (TID) | INTRAMUSCULAR | Status: DC

## 2023-09-12 MED ORDER — PHENOBARBITAL SODIUM 65 MG/ML IJ SOLN: 32.5000 mg | Freq: Three times a day (TID) | INTRAMUSCULAR | Status: DC

## 2023-09-12 MED ORDER — QUETIAPINE FUMARATE 25 MG PO TABS
50.0000 mg | ORAL_TABLET | Freq: Every day | ORAL | Status: DC
  Administered 2023-09-12: 50 mg via ORAL
  Filled 2023-09-12: qty 2

## 2023-09-12 MED ORDER — PANTOPRAZOLE SODIUM 40 MG PO TBEC
40.0000 mg | DELAYED_RELEASE_TABLET | Freq: Every day | ORAL | Status: DC
  Administered 2023-09-12 – 2023-09-14 (×3): 40 mg via ORAL
  Filled 2023-09-12 (×3): qty 1

## 2023-09-12 MED ORDER — DEXMEDETOMIDINE HCL IN NACL 400 MCG/100ML IV SOLN
0.0000 ug/kg/h | INTRAVENOUS | Status: DC
  Administered 2023-09-12: 1.2 ug/kg/h via INTRAVENOUS
  Filled 2023-09-12: qty 100

## 2023-09-12 MED ORDER — DEXMEDETOMIDINE HCL IN NACL 400 MCG/100ML IV SOLN
INTRAVENOUS | Status: AC
  Administered 2023-09-12: 0.4 ug/kg/h via INTRAVENOUS
  Filled 2023-09-12: qty 100

## 2023-09-12 MED ORDER — QUETIAPINE FUMARATE 25 MG PO TABS: 50.0000 mg | ORAL_TABLET | Freq: Every day | ORAL | Status: DC

## 2023-09-12 MED ORDER — QUETIAPINE FUMARATE 25 MG PO TABS
25.0000 mg | ORAL_TABLET | Freq: Every day | ORAL | Status: DC
  Administered 2023-09-12: 25 mg via ORAL
  Filled 2023-09-12: qty 1

## 2023-09-12 MED ORDER — POTASSIUM CHLORIDE 10 MEQ/100ML IV SOLN
10.0000 meq | INTRAVENOUS | Status: AC
  Administered 2023-09-12 (×8): 10 meq via INTRAVENOUS
  Filled 2023-09-12 (×8): qty 100

## 2023-09-12 MED ORDER — TRAMADOL HCL 50 MG PO TABS
50.0000 mg | ORAL_TABLET | Freq: Two times a day (BID) | ORAL | Status: DC | PRN
  Administered 2023-09-12 – 2023-09-13 (×2): 50 mg via ORAL
  Filled 2023-09-12 (×2): qty 1

## 2023-09-12 MED ORDER — GABAPENTIN 400 MG PO CAPS
400.0000 mg | ORAL_CAPSULE | Freq: Two times a day (BID) | ORAL | Status: DC
  Administered 2023-09-12 – 2023-09-14 (×4): 400 mg via ORAL
  Filled 2023-09-12 (×4): qty 1

## 2023-09-12 MED ORDER — LORAZEPAM 2 MG/ML IJ SOLN
2.0000 mg | Freq: Once | INTRAMUSCULAR | Status: AC
  Administered 2023-09-12: 2 mg via INTRAVENOUS
  Filled 2023-09-12: qty 1

## 2023-09-12 NOTE — Progress Notes (Signed)
 Updated pts daughter Deborh Pense via telephone regarding pts condition and current plan of care.  Pts daughter confirms the pt has been drinking alcohol  recently.  She has recently found empty alcohol  bottles at pts residence.  All questions were answered and she was appreciative to receive an update.  Will continue to monitor and assess pt   Kathy [name], [facility]  Pulmonary/Critical Care Pager [phone] (please enter 7 digits) PCCM Consult Pager [phone] (please enter 7 digits)

## 2023-09-12 NOTE — Plan of Care (Signed)

## 2023-09-12 NOTE — Progress Notes (Signed)
 Overnight, the patient developed increasing agitation, confusion, and restlessness. She was difficult to reorient and persistently tried to get out of bed, asking to leave Against Medical Advice (AMA). Upon bedside evaluation, she remained difficult to reorient, fidgety, and repeatedly asked for her cigarettes and Valium , insisting that she needed to leave immediately. She was only oriented to herself and exhibited no awareness of her medical condition. She received multiple doses of her PRN Lorazepam  and Valium  with no effect so precedex  was initiated for agitation. Will attempt to wean off pending effect of seroquel  administration. At this time she is not capable of making informed decision and cannot be safely discharged AMA.   Almarie Nose, DNP, CCRN, FNP-C, AGACNP-BC Acute Care & Family Nurse Practitioner  [facility]  See Amion for personal pager PCCM on call pager [phone] until 7 am

## 2023-09-12 NOTE — Progress Notes (Signed)
  Echocardiogram 2D Echocardiogram has been performed.  Kathy [name] 09/12/2023, 8:17 AM

## 2023-09-12 NOTE — Progress Notes (Signed)
 [facility]  PROGRESS NOTE   Subjective:   Patient seen at bedside in the ICU.  Patient is arousable but confused.  Sodium was overcorrected and was started on D5W.  Objective:  Vital signs: Blood pressure (!) 141/79, pulse (!) 57, temperature 98.1 F (36.7 C), temperature source Oral, resp. rate 15, height 5' 2 (1.575 m), weight 48.2 kg, SpO2 98%.  Intake/Output Summary (Last 24 hours) at 09/12/2023 1233 Last data filed at 09/12/2023 1153 Gross per 24 hour  Intake 3035.9 ml  Output 450 ml  Net 2585.9 ml   Filed Weights   09/11/23 0819 09/11/23 1820  Weight: 48.1 kg 48.2 kg     Physical Exam: General:  No acute distress  Head:  Normocephalic, atraumatic. Moist oral mucosal membranes  Eyes:  Anicteric  Neck:  Supple  Lungs:   Clear to auscultation, normal effort  Heart:  S1S2 no rubs  Abdomen:   Soft, nontender, bowel sounds present  Extremities:  peripheral edema.  Neurologic:  Awake, alert, following commands  Skin:  No lesions  Access:     Basic Metabolic Panel: Recent Labs  Lab 09/11/23 0822 09/11/23 0823 09/11/23 1200 09/11/23 1656 09/11/23 2128 09/11/23 2331 09/12/23 0424 09/12/23 0732 09/12/23 1106  NA 113*  --  114*   < > 130* 131* 131* 125* 122*  K 3.3*  --  3.2*  --   --   --  2.7*  --   --   CL 77*  --  81*  --   --   --  98  --   --   CO2 18*  --  20*  --   --   --  24  --   --   GLUCOSE 140*  --  115*  --   --   --  144*  --   --   BUN 6*  --  <5*  --   --   --  <5*  --   --   CREATININE 0.50  --  0.44  --   --   --  0.49  --   --   CALCIUM  7.2*  --  6.7*  --   --   --  7.9*  --   --   MG  --  1.4*  --   --   --   --  2.0  --   --    < > = values in this interval not displayed.   GFR: Estimated Creatinine Clearance: 56.2 mL/min (by C-G formula based on SCr of 0.49 mg/dL).  Liver Function Tests: Recent Labs  Lab 09/11/23 0822 09/11/23 1200 09/12/23 0424  AST 60* 56* 47*  ALT 31 27 24   ALKPHOS 95 77 83  BILITOT 2.8* 2.3*  1.3*  PROT 6.6 5.8* 6.8  ALBUMIN 3.8 3.3* 3.6   Recent Labs  Lab 09/11/23 0822  LIPASE 29   No results for input(s): AMMONIA in the last 168 hours.  CBC: Recent Labs  Lab 09/11/23 0822 09/12/23 0424  WBC 10.2 6.0  HGB 12.9 12.7  HCT 34.7* 34.4*  MCV 86.5 88.2  PLT 133* 113*     HbA1C: Hemoglobin A1C  Date/Time Value Ref Range Status  03/19/2023 12:25 PM 6.3 (A) 4.0 - 5.6 % Final  05/28/2021 09:43 AM 4.7 4.0 - 5.6 % Final   Hgb A1c MFr Bld  Date/Time Value Ref Range Status  09/11/2023 08:22 AM 4.9 4.8 - 5.6 % Final  Comment:    (NOTE) Diagnosis of Diabetes The following HbA1c ranges recommended by the American Diabetes Association (ADA) may be used as an aid in the diagnosis of diabetes mellitus.  Hemoglobin             Suggested A1C NGSP%              Diagnosis  <5.7                   Non Diabetic  5.7-6.4                Pre-Diabetic  >6.4                   Diabetic  <7.0                   Glycemic control for                       adults with diabetes.    10/03/2018 08:41 AM 5.4 4.8 - 5.6 % Final    Comment:             Prediabetes: 5.7 - 6.4          Diabetes: >6.4          Glycemic control for adults with diabetes: <7.0     Urinalysis: Recent Labs    09/11/23 1440  COLORURINE STRAW*  LABSPEC 1.001*  PHURINE 7.0  GLUCOSEU NEGATIVE  HGBUR SMALL*  BILIRUBINUR NEGATIVE  KETONESUR 5*  PROTEINUR NEGATIVE  NITRITE NEGATIVE  LEUKOCYTESUR NEGATIVE      Imaging: ECHOCARDIOGRAM COMPLETE Result Date: 09/12/2023    ECHOCARDIOGRAM REPORT   Patient Name:   Kathy [name] Date of Exam: 09/12/2023 Medical Rec #:  [id number]           Height:       62.0 in Accession #:    [id number]          Weight:       106.3 lb Date of Birth:  Jan 22, 1961           BSA:          1.461 m Patient Age:    62 years            BP:           83/56 mmHg Patient Gender: F                   HR:           56 bpm. Exam Location:  [facility] Procedure: 2D Echo, Cardiac Doppler  and Color Doppler (Both Spectral and Color            Flow Doppler were utilized during procedure). Indications:     Abnormal ECG R94.31  History:         Patient has prior history of Echocardiogram examinations, most                  recent 12/06/2022.  Sonographer:     Thedora Louder RDCS, FASE Referring Phys:  [id number] LONELL KANDICE MOOSE Diagnosing Phys: Cara JONETTA Lovelace MD IMPRESSIONS  1. Left ventricular ejection fraction, by estimation, is 55 to 60%. The left ventricle has normal function. The left ventricle has no regional wall motion abnormalities. There is mild left ventricular hypertrophy. Left ventricular diastolic parameters are consistent with Grade II diastolic dysfunction (pseudonormalization).  2. Right ventricular systolic function is normal. The right ventricular  size is normal.  3. The mitral valve is normal in structure. Mild mitral valve regurgitation.  4. The aortic valve is normal in structure. Aortic valve regurgitation is not visualized. FINDINGS  Left Ventricle: Left ventricular ejection fraction, by estimation, is 55 to 60%. The left ventricle has normal function. The left ventricle has no regional wall motion abnormalities. Strain was performed and the global longitudinal strain is indeterminate. The left ventricular internal cavity size was normal in size. There is mild left ventricular hypertrophy. Left ventricular diastolic parameters are consistent with Grade II diastolic dysfunction (pseudonormalization). Right Ventricle: The right ventricular size is normal. No increase in right ventricular wall thickness. Right ventricular systolic function is normal. Left Atrium: Left atrial size was normal in size. Right Atrium: Right atrial size was normal in size. Pericardium: There is no evidence of pericardial effusion. Mitral Valve: The mitral valve is normal in structure. Mild mitral valve regurgitation. Tricuspid Valve: The tricuspid valve is normal in structure. Tricuspid valve regurgitation  is mild. Aortic Valve: The aortic valve is normal in structure. Aortic valve regurgitation is not visualized. Aortic valve peak gradient measures 3.1 mmHg. Pulmonic Valve: The pulmonic valve was normal in structure. Pulmonic valve regurgitation is not visualized. Aorta: The ascending aorta was not well visualized. IAS/Shunts: No atrial level shunt detected by color flow Doppler. Additional Comments: 3D was performed not requiring image post processing on an independent workstation and was indeterminate.  LEFT VENTRICLE PLAX 2D LVIDd:         3.10 cm   Diastology LVIDs:         2.20 cm   LV e' medial:    6.53 cm/s LV PW:         1.40 cm   LV E/e' medial:  14.4 LV IVS:        1.10 cm   LV e' lateral:   6.42 cm/s LVOT diam:     1.80 cm   LV E/e' lateral: 14.6 LV SV:         55 LV SV Index:   38 LVOT Area:     2.54 cm  RIGHT VENTRICLE RV Basal diam:  2.90 cm RV S prime:     9.79 cm/s TAPSE (M-mode): 1.8 cm LEFT ATRIUM           Index        RIGHT ATRIUM           Index LA diam:      2.40 cm 1.64 cm/m   RA Area:     11.80 cm LA Vol (A2C): 36.3 ml 24.84 ml/m  RA Volume:   24.70 ml  16.90 ml/m LA Vol (A4C): 30.8 ml 21.08 ml/m  AORTIC VALVE                 PULMONIC VALVE AV Area (Vmax): 2.16 cm     PV Vmax:        0.57 m/s AV Vmax:        88.40 cm/s   PV Peak grad:   1.3 mmHg AV Peak Grad:   3.1 mmHg     RVOT Peak grad: 1 mmHg LVOT Vmax:      75.00 cm/s LVOT Vmean:     49.000 cm/s LVOT VTI:       0.217 m  AORTA Ao Root diam: 3.50 cm Ao Asc diam:  3.30 cm MITRAL VALVE               TRICUSPID VALVE MV Area (  PHT): 2.92 cm    TR Peak grad:   27.0 mmHg MV Decel Time: 260 msec    TR Vmax:        260.00 cm/s MV E velocity: 93.80 cm/s MV A velocity: 87.00 cm/s  SHUNTS MV E/A ratio:  1.08        Systemic VTI:  0.22 m                            Systemic Diam: 1.80 cm Cara JONETTA Lovelace MD Electronically signed by Cara JONETTA Lovelace MD Signature Date/Time: 09/12/2023/9:30:18 AM    Final      Medications:    dexmedetomidine   (PRECEDEX ) IV infusion 1.1 mcg/kg/hr (09/12/23 1153)   potassium chloride  10 mEq (09/12/23 1226)    Chlorhexidine  Gluconate Cloth  6 each Topical Daily   folic acid   1 mg Oral Daily   multivitamin with minerals  1 tablet Oral Daily   nicotine   21 mg Transdermal Daily   QUEtiapine   25 mg Oral QHS   thiamine   100 mg Oral Daily   Or   thiamine   100 mg Intravenous Daily    Assessment/ Plan:     63 y.o. female with a PMHx of alcohol  abuse history, thrombocytopenia, prediabetes, anemia, fatty liver, atrophic left kidney now admitted with history of 2 to 3 days of nausea vomiting.  She was found to have a sodium of 113.  As per the family patient has been drinking even recently.   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  Sodium has improved rather quickly.  She was given DDAVP  and also started on D5W.  Now the sodium has dropped significantly.  Will discontinue the D5W.  Will continue to monitor sodium levels frequently.  Even the patient can be started on normal saline.    #2: Hypokalemia: This is most likely secondary to vomiting complicated by alcohol  use. Supplement potassium as ordered.   #3: Hypomagnesemia: Most likely secondary to alcohol  abuse.  Continue supplementation   #4: Alcohol  withdrawal protocol: Patient is on withdrawal observation.  Continue with multivitamin and thiamine .  #5: Atrophic left kidney: Renal indices are stable.  Left kidney is not functional.   Case discussed with ICU team. Labs and medications reviewed. Will continue to follow along with you.   LOS: 1 Pinkey Edman,  MD [facility] 7/20/202512:33 PM

## 2023-09-12 NOTE — Progress Notes (Signed)
 NAME:  Kathy [name], MRN:  [id number], DOB:  Aug 14, 1960, LOS: 1 ADMISSION DATE:  09/11/2023, CONSULTATION DATE: 09/11/2023 REFERRING MD: Dr. Soledad, CHIEF COMPLAINT: Nausea/Vomiting/Diarrhea    History of Present Illness:  This is a 63 yo female with a PMH of ETOH abuse, alcoholic hepatitis, COPD, and thrombocytopenia.  She presented to [facility] ER on 07/19 via EMS from home with a 2-3 day hx of weakness, nausea, vomiting, and diarrhea.  She also endorsed mild abdominal cramping.    ED Course In the ER significant lab results were: Na+ 113/K+ 3.3/chloride 77/CO2 18/glucose 140/calcium  7.2/anion gap 18/magnesium  1.4/AST 60/total bilirubin 2.8/platelets 133.  Pt tremulous and anxious per EDP notes.  Pt states she has been sober since Nov. 2024 and denies current ETOH abuse.  Medications received in ED: 4 mg of iv zofran /40 meq po K+/500 ml NS bolus/2.5 mg iv valium .  Pt admitted to ICU per hospitalist team and started on hypertonic saline.  PCCM team consulted to assist with management.   Pertinent  Medical History  Abdominal Aortic Atherosclerosis  ETOH Abuse  Alcoholic Ketoacidosis  Alcoholic Hepatitis  Allergic Rhinitis  Bronchitis  Anxiety  COPD  Depression  Gallbladder Mass GERD Hip Fracture  Neuropathy  Thrombocytopenia   Significant Hospital Events: Including procedures, antibiotic start and stop dates in addition to other pertinent events   07/19: Admitted with nausea/vomiting/diarrhea and hyponatremia requiring hypertonic saline  09/12/23- patient on precedex  gtt, and valium  continues to shows signs of aggitation with tremulous presentation.  Remains critically ill with   Interim History / Subjective:  Pt resting comfortably in bed states her nausea has improved currently on RA vital signs stable   Objective    Blood pressure 105/66, pulse (!) 56, temperature 98.1 F (36.7 C), temperature source Oral, resp. rate 16, height 5' 2 (1.575 m), weight 48.2 kg, SpO2 93%.         Intake/Output Summary (Last 24 hours) at 09/12/2023 0913 Last data filed at 09/12/2023 9178 Gross per 24 hour  Intake 3303.85 ml  Output 450 ml  Net 2853.85 ml   Filed Weights   09/11/23 0819 09/11/23 1820  Weight: 48.1 kg 48.2 kg    Examination: General: Well-developed, well-nourished female, NAD on RA  HENT: Supple, no JVD Lungs: Clear throughout, even, non labored  Cardiovascular: Atrial flutter, s1s2, no m/r/g, 2+ radial/2+ distal pulses, no edema  Abdomen: +BS x4, soft, non tender, non distended  Extremities: Normal bulk and tone, moves all extremities  Neuro: Alert and oriented, follows commands, PERRLA, mild tremors  GU: Voiding   Resolved problem list   Assessment and Plan   #ETOH abuse hx (states she has been sober since Nov 2024) #Anxiety/Tremors  Hx: Depression and neuropathy  - Per hospitalist team CIWA protocol initiated  - Continue thiamine , folic acid , and mvi  - Urine drug screen pending  - Neuro checks q4hrs      -Order carbohydrate deficient transferrin or consider PeTh to determine alcoholism status   #Atrial flutter~rate controlled   - Continuous telemetry monitoring - Consider changing scheduled iv metoprolol  to prn  - Echo and troponin pending   #Hyponatremia  #Hypokalemia  #Hypomagnesia #Hypocalcemia  #Metabolic acidosis   - Serial Na+ levels  - Trend BMP  - Replace electrolytes as indicated  - Strict I&O's - Urine sodium, serum osmolality, and urine osmolality pending  - Nephrology consulted appreciate input: hypertonic saline per recommendations  - Seizure precautions   #Alcoholic hepatitis  - Trend hepatic function panel -  Avoid hepatoxic agents as able - Pt reports she has sustained for ETOH since Nov 2024   Best Practice (right click and Reselect all SmartList Selections daily)   Diet/type: clear liquids DVT prophylaxis Apixaban  per hospitalist service  Pressure ulcer(s): N/A GI prophylaxis: N/A Lines: N/A Foley:   N/A Code Status:  full code Last date of multidisciplinary goals of care discussion [N/A]  Discussed plan of care with pt and all questions were answered  Labs   CBC: Recent Labs  Lab 09/11/23 0822 09/12/23 0424  WBC 10.2 6.0  HGB 12.9 12.7  HCT 34.7* 34.4*  MCV 86.5 88.2  PLT 133* 113*    Basic Metabolic Panel: Recent Labs  Lab 09/11/23 0822 09/11/23 0823 09/11/23 1200 09/11/23 1656 09/11/23 2128 09/11/23 2331 09/12/23 0424 09/12/23 0732  NA 113*  --  114* 125* 130* 131* 131* 125*  K 3.3*  --  3.2*  --   --   --  2.7*  --   CL 77*  --  81*  --   --   --  98  --   CO2 18*  --  20*  --   --   --  24  --   GLUCOSE 140*  --  115*  --   --   --  144*  --   BUN 6*  --  <5*  --   --   --  <5*  --   CREATININE 0.50  --  0.44  --   --   --  0.49  --   CALCIUM  7.2*  --  6.7*  --   --   --  7.9*  --   MG  --  1.4*  --   --   --   --  2.0  --    GFR: Estimated Creatinine Clearance: 56.2 mL/min (by C-G formula based on SCr of 0.49 mg/dL). Recent Labs  Lab 09/11/23 0822 09/12/23 0424  WBC 10.2 6.0    Liver Function Tests: Recent Labs  Lab 09/11/23 0822 09/11/23 1200 09/12/23 0424  AST 60* 56* 47*  ALT 31 27 24   ALKPHOS 95 77 83  BILITOT 2.8* 2.3* 1.3*  PROT 6.6 5.8* 6.8  ALBUMIN 3.8 3.3* 3.6   Recent Labs  Lab 09/11/23 0822  LIPASE 29   No results for input(s): AMMONIA in the last 168 hours.  ABG    Component Value Date/Time   PHART 7.52 (H) 07/14/2014 1055   PCO2ART 33 07/14/2014 1055   PO2ART 69 (L) 07/14/2014 1055   HCO3 26.9 07/14/2014 1055   TCO2 24 04/16/2021 0959   ACIDBASEDEF 17.8 (H) 07/13/2014 1108   O2SAT 95.4 07/14/2014 1055     Coagulation Profile: No results for input(s): INR, PROTIME in the last 168 hours.  Cardiac Enzymes: No results for input(s): CKTOTAL, CKMB, CKMBINDEX, TROPONINI in the last 168 hours.  HbA1C: Hemoglobin A1C  Date/Time Value Ref Range Status  03/19/2023 12:25 PM 6.3 (A) 4.0 - 5.6 % Final   05/28/2021 09:43 AM 4.7 4.0 - 5.6 % Final   Hgb A1c MFr Bld  Date/Time Value Ref Range Status  09/11/2023 08:22 AM 4.9 4.8 - 5.6 % Final    Comment:    (NOTE) Diagnosis of Diabetes The following HbA1c ranges recommended by the American Diabetes Association (ADA) may be used as an aid in the diagnosis of diabetes mellitus.  Hemoglobin             Suggested A1C NGSP%  Diagnosis  <5.7                   Non Diabetic  5.7-6.4                Pre-Diabetic  >6.4                   Diabetic  <7.0                   Glycemic control for                       adults with diabetes.    10/03/2018 08:41 AM 5.4 4.8 - 5.6 % Final    Comment:             Prediabetes: 5.7 - 6.4          Diabetes: >6.4          Glycemic control for adults with diabetes: <7.0     CBG: Recent Labs  Lab 09/11/23 1834  GLUCAP 128*    Review of Systems: Positives in BOLD   Gen: Denies fever, chills, weight change, fatigue, night sweats HEENT: Denies blurred vision, double vision, hearing loss, tinnitus, sinus congestion, rhinorrhea, sore throat, neck stiffness, dysphagia PULM: Denies shortness of breath, cough, sputum production, hemoptysis, wheezing CV: Denies chest pain, edema, orthopnea, paroxysmal nocturnal dyspnea, palpitations GI: abdominal pain, nausea, vomiting, diarrhea, hematochezia, melena, constipation, change in bowel habits GU: Denies dysuria, hematuria, polyuria, oliguria, urethral discharge Endocrine: Denies hot or cold intolerance, polyuria, polyphagia or appetite change Derm: Denies rash, dry skin, scaling or peeling skin change Heme: Denies easy bruising, bleeding, bleeding gums Neuro: Denies headache, numbness, weakness, slurred speech, loss of memory or consciousness   Past Medical History:  She,  has a past medical history of Abdominal aortic atherosclerosis (HCC), Abnormal weight loss, Alcohol  abuse, Alcohol  abuse with withdrawal (HCC) (10/13/2020), Alcohol  withdrawal  (HCC) (11/29/2022), Alcoholic hepatitis (09/09/2020), Alcoholic ketoacidosis (07/12/2014), Allergic rhinitis, Anxiety, Bronchitis, Chicken pox, Colon polyps, COPD (chronic obstructive pulmonary disease) (HCC), Depression, Dyspnea, Elevated lipase, Fatty liver, Gallbladder mass (09/09/2020), GERD (gastroesophageal reflux disease), Hepatitis, Hernia of abdominal cavity, Hip fracture (HCC) (right), Left upper quadrant abdominal pain, Neuromuscular disorder (HCC), Neuropathy, Thrombocytopenia (HCC), and Wears dentures.   Surgical History:   Past Surgical History:  Procedure Laterality Date   BIOPSY  08/31/2022   Procedure: BIOPSY;  Surgeon: Maryruth Ole DASEN, MD;  Location: [facility] ENDOSCOPY;  Service: Endoscopy;;   CATARACT EXTRACTION W/PHACO Right 07/29/2021   Procedure: CATARACT EXTRACTION PHACO AND INTRAOCULAR LENS PLACEMENT (IOC) RIGHT 19.12 01:27.4;  Surgeon: Jaye Fallow, MD;  Location: [facility]ERY [facility];  Service: Ophthalmology;  Laterality: Right;   CATARACT EXTRACTION W/PHACO Left 08/12/2021   Procedure: CATARACT EXTRACTION PHACO AND INTRAOCULAR LENS PLACEMENT (IOC) LEFT 13.10 01:05.3;  Surgeon: Jaye Fallow, MD;  Location: [facility];  Service: Ophthalmology;  Laterality: Left;   CHOLECYSTECTOMY     COLONOSCOPY     COLONOSCOPY WITH PROPOFOL  N/A 08/31/2022   Procedure: COLONOSCOPY WITH PROPOFOL ;  Surgeon: Maryruth Ole DASEN, MD;  Location: [facility] ENDOSCOPY;  Service: Endoscopy;  Laterality: N/A;   ESOPHAGOGASTRODUODENOSCOPY (EGD) WITH PROPOFOL  N/A 08/31/2022   Procedure: ESOPHAGOGASTRODUODENOSCOPY (EGD) WITH PROPOFOL ;  Surgeon: Maryruth Ole DASEN, MD;  Location: [facility] ENDOSCOPY;  Service: Endoscopy;  Laterality: N/A;   EYE SURGERY     POLYPECTOMY  08/31/2022   Procedure: POLYPECTOMY;  Surgeon: Maryruth Ole DASEN, MD;  Location: [facility] ENDOSCOPY;  Service: Endoscopy;;  ROBOTIC ASSISTED LAPAROSCOPIC CHOLECYSTECTOMY  04/16/2021   [facility]     Social History:   reports that she has  been smoking cigarettes. She has a 20 pack-year smoking history. She has never used smokeless tobacco. She reports that she does not drink alcohol  and does not use drugs.   Family History:  Her family history includes Alcohol  abuse in an other family member; Cancer (age of onset: 34) in her father; Colon cancer in her paternal grandmother; Hypertension in her mother; Meniere's disease in her mother; Osteoporosis in her mother. There is no history of Breast cancer.   Allergies Allergies  Allergen Reactions   Azithromycin  Other (See Comments)   Buspirone  Hives   Ciprofloxacin Hives   Lyrica  [Pregabalin ] Other (See Comments)    Blurred vision      Home Medications  Prior to Admission medications   Medication Sig Start Date End Date Taking? Authorizing Provider  gabapentin  (NEURONTIN ) 400 MG capsule Take 2 capsules (800 mg total) by mouth 3 (three) times daily. Patient taking differently: Take 400 mg by mouth 2 (two) times daily. 03/19/23  Yes Marylynn Verneita CROME, MD  omeprazole  (PRILOSEC) 40 MG capsule TAKE ONE CAPSULE BY MOUTH TWO TIMES DAILY 03/19/23  Yes Tullo, Teresa L, MD  ondansetron  (ZOFRAN -ODT) 8 MG disintegrating tablet TAKE 1 TABLET BY MOUTH EVERY 8 HOURS AS NEEDED FOR NAUSEA AND VOMITING 05/31/23  Yes Marylynn Verneita CROME, MD  traMADol  (ULTRAM ) 50 MG tablet TAKE ONE TABLET BY MOUTH TWICE DAILY 08/16/23  Yes Tullo, Teresa L, MD  hydrOXYzine  (ATARAX ) 10 MG tablet Take 1 tablet (10 mg total) by mouth 2 (two) times daily as needed. 03/19/23   Marylynn Verneita CROME, MD     Critical care provider statement:   Total critical care time: 33 minutes   Performed by: Parris MD   Critical care time was exclusive of separately billable procedures and treating other patients.   Critical care was necessary to treat or prevent imminent or life-threatening deterioration.   Critical care was time spent personally by me on the following activities: development of treatment plan with patient and/or surrogate as  well as nursing, discussions with consultants, evaluation of patient's response to treatment, examination of patient, obtaining history from patient or surrogate, ordering and performing treatments and interventions, ordering and review of laboratory studies, ordering and review of radiographic studies, pulse oximetry and re-evaluation of patient's condition.    Shakaria Raphael, M.D.  Pulmonary & Critical Care Medicine

## 2023-09-12 NOTE — Progress Notes (Signed)
 Patient continues to pull off leads, IV sites, clothes and try to get out of bed. Almarie Nose, NP at bedside. She ordered Precedex  to be infused. While at bedside she ordered to max out due to patient behavior. This RN remains at bedside to provide safety. Bed in the lowest position, call bell within reach side rails up X2, bed alarm in place, floor mats in place. Will continue to monitor.

## 2023-09-12 NOTE — Progress Notes (Signed)
 Patient continues to pull at leads and lines and disconnect them. Educated this patient on why it is important to maintain these lines and monitor. Patient also continues to try to get up and mess with belongings at bedside. Educated on safety and patient stated she understood the need to maintain safety. Will continue to monitor. Call bell within reach, side rails up X2, bed alarm in place.

## 2023-09-12 NOTE — Progress Notes (Signed)
 This patient decided that she wanted to leave AMA for unknown reasons. She stated she had to go home to see her grandchildren and she knew there was nothing wrong with her. She has been very anxiously driven and agitated all shift with multiple redirections from nursing staff. Erminio Cone, NP and Almarie Nose, NP made aware. Elizabeth in to talk with patient. She placed orders for patient and patient is currently going to stay until the afternoon she states. Bed in the lowest position, call bell within reach side rails up X 2, bed alarm in place.

## 2023-09-12 NOTE — Progress Notes (Signed)
 While taking this patient to the bedside commode, this RN knocked over this patient's purse that was on the patient's bed. From the purse fell a pack of cigarettes, her wallet and 2 pill bottles. Upon closer inspection this RN found a zip lock bag with loose pills, a total of 3 pill bottles with no clear prescriptions on the outside label and mixed medications in them and 2 lighters. Called the [facility] and Debby stated that this RN needed to lock them up in security and pharmacy. Sent pharmacy the medications in sealed bag and sent cigarettes with lighters in sealed bag with security. Patient signed for both papers and place in chart and epic. Explained to patient that she would receive them upon discharge and she verbalized understanding. Informed Erminio Cone, NP and also asked for a nicotine  patch for the patient and she stated she would not order. Will contiune to monitor patient. Vital signs are stable, bed in the lowest position, call bell within reach, side rails up X2.

## 2023-09-12 NOTE — Progress Notes (Signed)
 Erminio cone, NP at bedside and placed order for safety sitter due to patient behavior. Sitter at bedside. Will continue to monitor.

## 2023-09-12 NOTE — Progress Notes (Signed)
 Patient continues to pull at lines even with sitter and additional nursing staff in the room. Patient was able to remove IV site but this RN was able to place another with assistance from other staff. Providers aware. Will continue to monitor.

## 2023-09-12 NOTE — Consult Note (Signed)
 MEDICATION RELATED CONSULT NOTE  Pharmacy Consult for Hypertonic Saline Monitoring Indication: Severe hyponatremia  Patient Measurements: Height: 5' 2 (157.5 cm) Weight: 48.2 kg (106 lb 4.2 oz) IBW/kg (Calculated) : 50.1  Vital Signs: Temp: 98.1 F (36.7 C) (07/20 0400) Temp Source: Oral (07/20 0400) BP: 158/77 (07/20 0500) Pulse Rate: 85 (07/20 0400) Intake/Output from previous day: 07/19 0701 - 07/20 0700 In: 2743.2 [I.V.:1093.2; IV Piggyback:1650] Out: -  Intake/Output from this shift: Total I/O In: 508.5 [I.V.:508.5] Out: -   Labs: Recent Labs    09/11/23 0822 09/11/23 0823 09/11/23 1200 09/12/23 0424  WBC 10.2  --   --  6.0  HGB 12.9  --   --  12.7  HCT 34.7*  --   --  34.4*  PLT 133*  --   --  113*  CREATININE 0.50  --  0.44 0.49  MG  --  1.4*  --   --   ALBUMIN 3.8  --  3.3* 3.6  PROT 6.6  --  5.8* 6.8  AST 60*  --  56* 47*  ALT 31  --  27 24  ALKPHOS 95  --  77 83  BILITOT 2.8*  --  2.3* 1.3*   Estimated Creatinine Clearance: 56.2 mL/min (by C-G formula based on SCr of 0.49 mg/dL).  Medical History: Past Medical History:  Diagnosis Date   Abdominal aortic atherosclerosis (HCC)    Abnormal weight loss    Alcohol  abuse    Alcohol  abuse with withdrawal (HCC) 10/13/2020   Alcohol  withdrawal (HCC) 11/29/2022   Alcoholic hepatitis 09/09/2020   Suggested by transaminitis noted during ER visit July 16 for intractible  N/V      Alcoholic ketoacidosis 07/12/2014   Noted during both  ER visits (June and July 2022) in the setting of intractable N/V  And history of chronic alcohol  abuse.  No workup was done,  No ETOH level, documentation of last alcohol  ingestion,  or tox screen.  GFR was normal  Urinalysis noted a pH of 5.0.  Review of recrods from May 2016 hospitlaization  note presentations of a severe  metabolic gap acidosis with respiratory compensation (   Allergic rhinitis    Anxiety    Bronchitis    Chicken pox    Colon polyps    COPD (chronic  obstructive pulmonary disease) (HCC)    Depression    Dyspnea    Elevated lipase    Fatty liver    Gallbladder mass 09/09/2020   8 x 6 x 6 mm non mobile  mass along GB wall noted on 2018 US  8 x  7 x 5 mm non mobile mass along GB wall noted on July  2022 US    GERD (gastroesophageal reflux disease)    Hepatitis    Hernia of abdominal cavity    Hip fracture (HCC) right   Left upper quadrant abdominal pain    Neuromuscular disorder (HCC)    Neuropathy    lower legs   Thrombocytopenia (HCC)    Wears dentures    full upper and lower    Medications:  3% NaCl to start at 30 cc/hr  Assessment: 63 y/o F with medical history as above presenting from home with weakness and N/V/D. Found to have severe hyponatremia and plan is to start hypertonic saline. Pharmacy to assist with monitoring.   0719 0822 Na 113 0719 1200 Na 114  3%  NaCl started at 30 ml/hr @ 1430  0719 1656 Na 125 3% NaC stopped @  1825  0719 2128 Na 130  0719 2331 Na 131  - D5W started at 100 ml/hr @ 2335   0720 0424 Na 131 -  ~ 18 mEq increase in 24 hrs - DDAVP  1 mcg SQ X 1 ordered for 0530   Goal of Therapy:  Notify physician if Na > 4 mmol/L in 2 hours or > 6 mmol/L in 4 hours. Goal sodium increase of 8 - 12 mmol/L in 24 hours  Plan:  --Initiate hypertonic saline as ordered --Na checks q2h x 2 and then q4h  Laquanta Hummel D 09/12/2023,5:28 AM

## 2023-09-12 NOTE — Progress Notes (Addendum)
 While attempting to keep patient in bed and stop patient from removing IV site patient hit additional nursing staff multiple times even after redirection from primary nurse. Provider aware. Will continue to monitor.

## 2023-09-13 ENCOUNTER — Telehealth: Payer: Self-pay

## 2023-09-13 ENCOUNTER — Telehealth (HOSPITAL_COMMUNITY): Payer: Self-pay | Admitting: Pharmacy Technician

## 2023-09-13 ENCOUNTER — Other Ambulatory Visit (HOSPITAL_COMMUNITY): Payer: Self-pay

## 2023-09-13 LAB — RENAL FUNCTION PANEL
Albumin: 2.8 g/dL — ABNORMAL LOW (ref 3.5–5.0)
Albumin: 3.6 g/dL (ref 3.5–5.0)
Anion gap: 8 (ref 5–15)
Anion gap: 11 (ref 5–15)
BUN: 7 mg/dL — ABNORMAL LOW (ref 8–23)
BUN: 6 mg/dL — ABNORMAL LOW (ref 8–23)
CO2: 23 mmol/L (ref 22–32)
CO2: 24 mmol/L (ref 22–32)
Calcium: 7 mg/dL — ABNORMAL LOW (ref 8.9–10.3)
Calcium: 7.6 mg/dL — ABNORMAL LOW (ref 8.9–10.3)
Chloride: 89 mmol/L — ABNORMAL LOW (ref 98–111)
Chloride: 93 mmol/L — ABNORMAL LOW (ref 98–111)
Creatinine, Ser: 0.31 mg/dL — ABNORMAL LOW (ref 0.44–1.00)
Creatinine, Ser: 0.37 mg/dL — ABNORMAL LOW (ref 0.44–1.00)
GFR, Estimated: >60 mL/min (ref >60)
GFR, Estimated: >60 mL/min (ref >60)
Glucose, Bld: 137 mg/dL — ABNORMAL HIGH (ref 70–99)
Glucose, Bld: 122 mg/dL — ABNORMAL HIGH (ref 70–99)
Phosphorus: <1 mg/dL — CL (ref 2.5–4.6)
Phosphorus: 3.2 mg/dL (ref 2.5–4.6)
Potassium: 3 mmol/L — ABNORMAL LOW (ref 3.5–5.1)
Potassium: 4.3 mmol/L (ref 3.5–5.1)
Sodium: 120 mmol/L — ABNORMAL LOW (ref 135–145)
Sodium: 128 mmol/L — ABNORMAL LOW (ref 135–145)

## 2023-09-13 LAB — SODIUM
Sodium: 131 mmol/L — ABNORMAL LOW (ref 135–145)
Sodium: 131 mmol/L — ABNORMAL LOW (ref 135–145)
Sodium: 134 mmol/L — ABNORMAL LOW (ref 135–145)

## 2023-09-13 LAB — MAGNESIUM: Magnesium: 1.7 mg/dL (ref 1.7–2.4)

## 2023-09-13 LAB — CALCIUM, IONIZED: Calcium, Ionized, Serum: 4 mg/dL — ABNORMAL LOW (ref 4.5–5.6)

## 2023-09-13 MED ORDER — CYANOCOBALAMIN 1000 MCG/ML IJ SOLN
1000.0000 ug | Freq: Once | INTRAMUSCULAR | Status: AC
  Administered 2023-09-13: 1000 ug via INTRAMUSCULAR
  Filled 2023-09-13: qty 1

## 2023-09-13 MED ORDER — ALUM & MAG HYDROXIDE-SIMETH 200-200-20 MG/5ML PO SUSP
30.0000 mL | ORAL | Status: DC | PRN
  Administered 2023-09-13 (×2): 30 mL via ORAL
  Filled 2023-09-13 (×2): qty 30

## 2023-09-13 MED ORDER — DEXTROSE 5 % IV BOLUS: 300.0000 mL | Freq: Once | INTRAVENOUS | Status: DC

## 2023-09-13 MED ORDER — DEXTROSE 5 % IV BOLUS
300.0000 mL | Freq: Once | INTRAVENOUS | Status: AC
  Administered 2023-09-13: 300 mL via INTRAVENOUS

## 2023-09-13 MED ORDER — MAGNESIUM SULFATE 2 GM/50ML IV SOLN
2.0000 g | Freq: Once | INTRAVENOUS | Status: AC
  Administered 2023-09-13: 2 g via INTRAVENOUS
  Filled 2023-09-13: qty 50

## 2023-09-13 MED ORDER — ACETAMINOPHEN 325 MG PO TABS: 650.0000 mg | ORAL_TABLET | Freq: Four times a day (QID) | ORAL | Status: DC | PRN

## 2023-09-13 MED ORDER — ENSURE PLUS HIGH PROTEIN PO LIQD
237.0000 mL | Freq: Two times a day (BID) | ORAL | Status: DC
  Administered 2023-09-14: 237 mL via ORAL

## 2023-09-13 MED ORDER — POTASSIUM PHOSPHATES 15 MMOLE/5ML IV SOLN
30.0000 mmol | Freq: Once | INTRAVENOUS | Status: AC
  Administered 2023-09-13: 30 mmol via INTRAVENOUS
  Filled 2023-09-13: qty 10

## 2023-09-13 NOTE — Plan of Care (Signed)

## 2023-09-13 NOTE — TOC Progression Note (Signed)
 Transition of Care [facility]) - Progression Note    Patient Details  Name: Kathy [name] MRN: [id number] Date of Birth: [birth year]  Transition of Care [facility]) CM/SW Contact  Marinda Cooks, RN Phone Number: 09/13/2023, 2:44 PM  Clinical Narrative:    Pt HOD#2 admitted for severe Hyponatremia pt cont to be medically managed for Alcohol  Withdrawal requiring Precedex . TOC will cont to follow dc planning/ care coordination and update as applicable.     Expected Discharge Plan and Services    TBD  Social Determinants of Health (SDOH) Interventions SDOH Screenings   Food Insecurity: No Food Insecurity (09/11/2023)  Housing: Unknown (09/11/2023)  Transportation Needs: No Transportation Needs (09/11/2023)  Utilities: Not At Risk (09/11/2023)  Alcohol  Screen: Low Risk  (09/29/2022)  Depression (PHQ2-9): Low Risk  (03/19/2023)  Financial Resource Strain: Low Risk  (09/29/2022)  Physical Activity: Insufficiently Active (09/29/2022)  Social Connections: Moderately Isolated (09/29/2022)  Stress: No Stress Concern Present (09/29/2022)  Tobacco Use: High Risk (09/11/2023)  Health Literacy: Adequate Health Literacy (09/29/2022)    Readmission Risk Interventions     No data to display

## 2023-09-13 NOTE — Progress Notes (Signed)
 BRIEF PCCM NOTE  12:45 Pt's 10:00 serum Na resulted at 128 (previously 120 @ 03:38 this morning), pt currently asymptomatic.   Normal saline infusion was discontinued, repeat Na ordered to verify accuracy.  Spoke with Dr. Marcelino of Nephrology who was in agreement with stopping NS and rechecking serum level, does not recommend D5W or DDAVP  currently.      16:00 Repeat Na of 131.  Discussed with Dr. Marcelino, who advises for D5W bolus of 300 cc, advised against DDAVP .  Orders placed.  Continue to follow serum Na+        Inge Lecher, AGACNP-BC [facility] Prefer epic messenger for cross cover needs If after hours, please call E-link

## 2023-09-13 NOTE — Telephone Encounter (Signed)
 Copied from CRM [id number]. Topic: General - Other >> Sep 13, 2023 11:49 AM Deleta RAMAN wrote: Reason for CRM: Patient is calling for information regarding an appointment on September 2nd. She would like a full blood panel at appointment. She also would like an MRI referral and a new medication that the er doctor prescribed her aderax for anxiety. Also, requesting b-12 shot on August 8th visit and check on the pro shot that insurance normally covers.

## 2023-09-13 NOTE — Care Management Important Message (Signed)
 Important Message  Patient Details  Name: Kathy [name] MRN: [id number] Date of Birth: March 15, 1960   Important Message Given:  Yes - Medicare IM     Rojelio SHAUNNA Rattler 09/13/2023, 12:49 PM

## 2023-09-13 NOTE — Progress Notes (Signed)
 [facility]  PROGRESS NOTE   Subjective:   Patient seen and evaluated at bedside in ICU Sitting up in bed Alert and oriented Room air  Sodium 120 Sodium chloride  infusion@75ml /hr  Objective:  Vital signs: Blood pressure 134/73, pulse 89, temperature 97.9 F (36.6 C), temperature source Axillary, resp. rate 14, height 5' 2 (1.575 m), weight 48.2 kg, SpO2 99%.  Intake/Output Summary (Last 24 hours) at 09/13/2023 1200 Last data filed at 09/13/2023 0514 Gross per 24 hour  Intake 755.41 ml  Output 600 ml  Net 155.41 ml   Filed Weights   09/11/23 0819 09/11/23 1820  Weight: 48.1 kg 48.2 kg     Physical Exam: General:  No acute distress  Head:  Normocephalic, atraumatic. Moist oral mucosal membranes  Eyes:  Anicteric  Neck:  Supple  Lungs:   Clear to auscultation, normal effort  Heart:  S1S2 no rubs  Abdomen:   Soft, nontender, bowel sounds present  Extremities:  No peripheral edema.  Neurologic:  Awake, alert, following commands  Skin:  No lesions  Access: None    Basic Metabolic Panel: Recent Labs  Lab 09/11/23 0822 09/11/23 0823 09/11/23 1200 09/11/23 1656 09/12/23 0424 09/12/23 0732 09/12/23 1106 09/12/23 1543 09/12/23 1920 09/12/23 2325 09/13/23 0338  NA 113*  --  114*   < > 131*   < > 122* 122* 123* 120* 120*  K 3.3*  --  3.2*  --  2.7*  --   --  4.1  --   --  3.0*  CL 77*  --  81*  --  98  --   --   --   --   --  89*  CO2 18*  --  20*  --  24  --   --   --   --   --  23  GLUCOSE 140*  --  115*  --  144*  --   --   --   --   --  137*  BUN 6*  --  <5*  --  <5*  --   --   --   --   --  7*  CREATININE 0.50  --  0.44  --  0.49  --   --   --   --   --  0.31*  CALCIUM  7.2*  --  6.7*  --  7.9*  --   --   --   --   --  7.0*  MG  --  1.4*  --   --  2.0  --   --   --   --   --  1.7  PHOS  --   --   --   --   --   --   --   --   --   --  <1.0*   < > = values in this interval not displayed.   GFR: Estimated Creatinine Clearance: 56.2 mL/min (A)  (by C-G formula based on SCr of 0.31 mg/dL (L)).  Liver Function Tests: Recent Labs  Lab 09/11/23 0822 09/11/23 1200 09/12/23 0424 09/13/23 0338  AST 60* 56* 47*  --   ALT 31 27 24   --   ALKPHOS 95 77 83  --   BILITOT 2.8* 2.3* 1.3*  --   PROT 6.6 5.8* 6.8  --   ALBUMIN 3.8 3.3* 3.6 2.8*   Recent Labs  Lab 09/11/23 0822  LIPASE 29   No results for input(s): AMMONIA in the  last 168 hours.  CBC: Recent Labs  Lab 09/11/23 0822 09/12/23 0424  WBC 10.2 6.0  HGB 12.9 12.7  HCT 34.7* 34.4*  MCV 86.5 88.2  PLT 133* 113*     HbA1C: Hemoglobin A1C  Date/Time Value Ref Range Status  03/19/2023 12:25 PM 6.3 (A) 4.0 - 5.6 % Final  05/28/2021 09:43 AM 4.7 4.0 - 5.6 % Final   Hgb A1c MFr Bld  Date/Time Value Ref Range Status  09/11/2023 08:22 AM 4.9 4.8 - 5.6 % Final    Comment:    (NOTE) Diagnosis of Diabetes The following HbA1c ranges recommended by the American Diabetes Association (ADA) may be used as an aid in the diagnosis of diabetes mellitus.  Hemoglobin             Suggested A1C NGSP%              Diagnosis  <5.7                   Non Diabetic  5.7-6.4                Pre-Diabetic  >6.4                   Diabetic  <7.0                   Glycemic control for                       adults with diabetes.    10/03/2018 08:41 AM 5.4 4.8 - 5.6 % Final    Comment:             Prediabetes: 5.7 - 6.4          Diabetes: >6.4          Glycemic control for adults with diabetes: <7.0     Urinalysis: Recent Labs    09/11/23 1440  COLORURINE STRAW*  LABSPEC 1.001*  PHURINE 7.0  GLUCOSEU NEGATIVE  HGBUR SMALL*  BILIRUBINUR NEGATIVE  KETONESUR 5*  PROTEINUR NEGATIVE  NITRITE NEGATIVE  LEUKOCYTESUR NEGATIVE      Imaging: ECHOCARDIOGRAM COMPLETE Result Date: 09/12/2023    ECHOCARDIOGRAM REPORT   Patient Name:   Kathy [name] Date of Exam: 09/12/2023 Medical Rec #:  [id number]           Height:       62.0 in Accession #:    [id number]           Weight:       106.3 lb Date of Birth:  01/20/1961           BSA:          1.461 m Patient Age:    62 years            BP:           83/56 mmHg Patient Gender: F                   HR:           56 bpm. Exam Location:  [facility] Procedure: 2D Echo, Cardiac Doppler and Color Doppler (Both Spectral and Color            Flow Doppler were utilized during procedure). Indications:     Abnormal ECG R94.31  History:         Patient has prior history of Echocardiogram examinations, most  recent 12/06/2022.  Sonographer:     Thedora Louder RDCS, FASE Referring Phys:  [id number] LONELL KANDICE MOOSE Diagnosing Phys: Cara JONETTA Lovelace MD IMPRESSIONS  1. Left ventricular ejection fraction, by estimation, is 55 to 60%. The left ventricle has normal function. The left ventricle has no regional wall motion abnormalities. There is mild left ventricular hypertrophy. Left ventricular diastolic parameters are consistent with Grade II diastolic dysfunction (pseudonormalization).  2. Right ventricular systolic function is normal. The right ventricular size is normal.  3. The mitral valve is normal in structure. Mild mitral valve regurgitation.  4. The aortic valve is normal in structure. Aortic valve regurgitation is not visualized. FINDINGS  Left Ventricle: Left ventricular ejection fraction, by estimation, is 55 to 60%. The left ventricle has normal function. The left ventricle has no regional wall motion abnormalities. Strain was performed and the global longitudinal strain is indeterminate. The left ventricular internal cavity size was normal in size. There is mild left ventricular hypertrophy. Left ventricular diastolic parameters are consistent with Grade II diastolic dysfunction (pseudonormalization). Right Ventricle: The right ventricular size is normal. No increase in right ventricular wall thickness. Right ventricular systolic function is normal. Left Atrium: Left atrial size was normal in size. Right Atrium: Right atrial size  was normal in size. Pericardium: There is no evidence of pericardial effusion. Mitral Valve: The mitral valve is normal in structure. Mild mitral valve regurgitation. Tricuspid Valve: The tricuspid valve is normal in structure. Tricuspid valve regurgitation is mild. Aortic Valve: The aortic valve is normal in structure. Aortic valve regurgitation is not visualized. Aortic valve peak gradient measures 3.1 mmHg. Pulmonic Valve: The pulmonic valve was normal in structure. Pulmonic valve regurgitation is not visualized. Aorta: The ascending aorta was not well visualized. IAS/Shunts: No atrial level shunt detected by color flow Doppler. Additional Comments: 3D was performed not requiring image post processing on an independent workstation and was indeterminate.  LEFT VENTRICLE PLAX 2D LVIDd:         3.10 cm   Diastology LVIDs:         2.20 cm   LV e' medial:    6.53 cm/s LV PW:         1.40 cm   LV E/e' medial:  14.4 LV IVS:        1.10 cm   LV e' lateral:   6.42 cm/s LVOT diam:     1.80 cm   LV E/e' lateral: 14.6 LV SV:         55 LV SV Index:   38 LVOT Area:     2.54 cm  RIGHT VENTRICLE RV Basal diam:  2.90 cm RV S prime:     9.79 cm/s TAPSE (M-mode): 1.8 cm LEFT ATRIUM           Index        RIGHT ATRIUM           Index LA diam:      2.40 cm 1.64 cm/m   RA Area:     11.80 cm LA Vol (A2C): 36.3 ml 24.84 ml/m  RA Volume:   24.70 ml  16.90 ml/m LA Vol (A4C): 30.8 ml 21.08 ml/m  AORTIC VALVE                 PULMONIC VALVE AV Area (Vmax): 2.16 cm     PV Vmax:        0.57 m/s AV Vmax:        88.40 cm/s   PV  Peak grad:   1.3 mmHg AV Peak Grad:   3.1 mmHg     RVOT Peak grad: 1 mmHg LVOT Vmax:      75.00 cm/s LVOT Vmean:     49.000 cm/s LVOT VTI:       0.217 m  AORTA Ao Root diam: 3.50 cm Ao Asc diam:  3.30 cm MITRAL VALVE               TRICUSPID VALVE MV Area (PHT): 2.92 cm    TR Peak grad:   27.0 mmHg MV Decel Time: 260 msec    TR Vmax:        260.00 cm/s MV E velocity: 93.80 cm/s MV A velocity: 87.00 cm/s  SHUNTS MV  E/A ratio:  1.08        Systemic VTI:  0.22 m                            Systemic Diam: 1.80 cm Cara JONETTA Lovelace MD Electronically signed by Cara JONETTA Lovelace MD Signature Date/Time: 09/12/2023/9:30:18 AM    Final      Medications:    sodium chloride  75 mL/hr at 09/13/23 0744   dexmedetomidine  (PRECEDEX ) IV infusion Stopped (09/12/23 1254)    Chlorhexidine  Gluconate Cloth  6 each Topical Daily   folic acid   1 mg Oral Daily   gabapentin   400 mg Oral BID   multivitamin with minerals  1 tablet Oral Daily   nicotine   21 mg Transdermal Daily   pantoprazole   40 mg Oral Daily   QUEtiapine   50 mg Oral QHS   thiamine   100 mg Oral Daily   Or   thiamine   100 mg Intravenous Daily    Assessment/ Plan:     63 y.o. female with a PMHx of alcohol  abuse history, thrombocytopenia, prediabetes, anemia, fatty liver, atrophic left kidney now admitted with history of 2 to 3 days of nausea vomiting.  She was found to have a sodium of 113.  As per the family patient has been drinking even recently.   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  Patient was initially started on 3% but was stopped and given DDAVP  with D5W.   Sodium 120. Receiving NS 0.9% at 75 ml/hr. If not improved, can consider restarting 3% hypertonic saline. Continue monitoring.   #2: Hypokalemia: This is most likely secondary to vomiting complicated by alcohol  use. Supplement potassium as ordered, IV and oral.   #3: Hypomagnesemia: Most likely secondary to alcohol  abuse.  Continue IV supplementation   #4: Alcohol  withdrawal protocol: Patient is on withdrawal  observation. CIWA protocol in place.  Continue with multivitamin and thiamine .  #5: Atrophic left kidney: Renal indices are stable.  Left kidney is not functional, severely atrophic on CT abd/pelvis.     LOS: 2 [facility] 7/21/202512:00 PM

## 2023-09-13 NOTE — Progress Notes (Signed)
 NAME:  Kathy [name], MRN:  [id number], DOB:  [birth year], LOS: 2 ADMISSION DATE:  09/11/2023, CONSULTATION DATE:  09/11/2023 REFERRING MD:  Dr. Soledad, CHIEF COMPLAINT:  Nausea/Vomiting/Diarrhea   Brief Pt Description / Synopsis:  63 y.o. female with PMH of ETOH abuse, alcoholic hepatitis, COPD, and thrombocytopenia who is admitted with severe Hyponatremia (suspect due to recent ETOH abuse) initially requiring 3% Hypertonic Saline.  Course complicated by development of Alcohol  Withdrawal requiring Precedex .  History of Present Illness:  This is a 63 yo female with a PMH of ETOH abuse, alcoholic hepatitis, COPD, and thrombocytopenia.  She presented to [facility] ER on 07/19 via EMS from home with a 2-3 day hx of weakness, nausea, vomiting, and diarrhea.  She also endorsed mild abdominal cramping.     ED Course In the ER significant lab results were: Na+ 113/K+ 3.3/chloride 77/CO2 18/glucose 140/calcium  7.2/anion gap 18/magnesium  1.4/AST 60/total bilirubin 2.8/platelets 133.  Pt tremulous and anxious per EDP notes.  Pt states she has been sober since Nov. 2024 and denies current ETOH abuse.  Medications received in ED: 4 mg of iv zofran /40 meq po K+/500 ml NS bolus/2.5 mg iv valium .  Pt admitted to ICU per hospitalist team and started on hypertonic saline.  PCCM team consulted to assist with management.   Please see Significant Hospital Events section below for full detailed hospital course.   Pertinent  Medical History  Abdominal Aortic Atherosclerosis  ETOH Abuse  Alcoholic Ketoacidosis  Alcoholic Hepatitis  Allergic Rhinitis  Bronchitis  Anxiety  COPD  Depression  Gallbladder Mass GERD Hip Fracture  Neuropathy  Thrombocytopenia   Micro Data:  7/19: SARS-CoV-2/Flu/RSV PCR>> negative 7/19: HIV Screen>> negative  Antimicrobials:   Anti-infectives (From admission, onward)    None       Significant Hospital Events: Including procedures, antibiotic start and stop dates in  addition to other pertinent events   07/19: Admitted with nausea/vomiting/diarrhea and hyponatremia requiring hypertonic saline  09/12/23- patient on precedex  gtt, and valium  continues to shows signs of aggitation with tremulous presentation.  Remains critically ill with  09/13/23: No significant events overnight, Precedex  currently off, agitation and tremors have resolved currently.  On normal saline infusion at 75 for sodium of 120 this morning.  Interim History / Subjective:  As outlined above under Significant Hospital Events section  Objective   Blood pressure 114/72, pulse 93, temperature 97.9 F (36.6 C), temperature source Axillary, resp. rate (!) 27, height 5' 2 (1.575 m), weight 48.2 kg, SpO2 100%.        Intake/Output Summary (Last 24 hours) at 09/13/2023 0859 Last data filed at 09/13/2023 9485 Gross per 24 hour  Intake 1487.46 ml  Output 600 ml  Net 887.46 ml   Filed Weights   09/11/23 0819 09/11/23 1820  Weight: 48.1 kg 48.2 kg    Examination: General: Acutely ill-appearing female, sitting in bed, on room air, no acute distress HENT: Atraumatic, normocephalic, neck supple, no JVD Lungs: Clear breath sounds throughout, even, nonlabored, normal effort Cardiovascular: Tachycardic, regular rhythm, S1-S2, no murmurs, rubs or gallops Abdomen: Soft, nontender, nondistended, no guarding or rebound tenderness, bowel sounds positive x 4 Extremities: Normal bulk and tone, no deformities, no edema Neuro: Awake and alert, oriented x 3, moves all extremities to command, no focal evidence, speech clear, pupils PERRLA GU: Deferred  Resolved Hospital Problem list     Assessment & Plan:   #ETOH abuse hx (states she has been sober since Nov 2024) #Anxiety/Tremors concerning for developing Alcohol   Withdrawal ~ IMPROVED  Hx: Depression and neuropathy  -Treatment of metabolic derangements as outlined above -Provide supportive care -Promote normal sleep/wake cycle and family  presence -Avoid sedating medications as able -Continue CIWA protocol -Precedex  as needed ~ has been weaned off -Continue Thiamine , Folic acid , MVI   #Hyponatremia, likely secondary to ETOH abuse #Hypokalemia  #Hypomagnesemia #Hypocalcemia  #Metabolic acidosis   -Monitor I&O's / urinary output -Follow BMP (serum Na+ q4h) -Goal Na+ correction of 6-8 mEq per 24 hr -Ensure adequate renal perfusion -Avoid nephrotoxic agents as able -Replace electrolytes as indicated ~ Pharmacy following for assistance with electrolyte replacement -Nephrology following, appreciate input ~ initially was on 3% Hypertonic saline which was discontinued given rapid correction and was given DDAVP  and D5W ~ now the Na has dropped significantly of which she was placed on NS infusion (it not improved, consider restarting 3% hypertonic saline)  #Alcoholic hepatitis  - Trend hepatic function panel - Avoid hepatoxic agents as able - Pt reports she has sustained for ETOH since Nov 2024 ~ but pt's daughters reported the patient has been drinking alcohol  recently (found empty bottles at her residence)  #Atrial Flutter, rate controlled -Continuous cardiac monitoring -Maintain MAP >65 -IV fluids -Vasopressors as needed to maintain MAP goal -Trend lactic acid until normalized -Trend HS Troponin until peaked -Echocardiogram pending -Diuresis as BP and renal function permits       Best Practice (right click and Reselect all SmartList Selections daily)   Diet/type: Regular consistency (see orders) DVT prophylaxis: SCD GI prophylaxis: PPI Lines: N/A Foley:  N/A Code Status:  full code Last date of multidisciplinary goals of care discussion [7/21]  7/21: Pt updated at bedside on plan of care.  Labs   CBC: Recent Labs  Lab 09/11/23 0822 09/12/23 0424  WBC 10.2 6.0  HGB 12.9 12.7  HCT 34.7* 34.4*  MCV 86.5 88.2  PLT 133* 113*    Basic Metabolic Panel: Recent Labs  Lab 09/11/23 0822  09/11/23 0823 09/11/23 1200 09/11/23 1656 09/12/23 0424 09/12/23 0732 09/12/23 1106 09/12/23 1543 09/12/23 1920 09/12/23 2325 09/13/23 0338  NA 113*  --  114*   < > 131*   < > 122* 122* 123* 120* 120*  K 3.3*  --  3.2*  --  2.7*  --   --  4.1  --   --  3.0*  CL 77*  --  81*  --  98  --   --   --   --   --  89*  CO2 18*  --  20*  --  24  --   --   --   --   --  23  GLUCOSE 140*  --  115*  --  144*  --   --   --   --   --  137*  BUN 6*  --  <5*  --  <5*  --   --   --   --   --  7*  CREATININE 0.50  --  0.44  --  0.49  --   --   --   --   --  0.31*  CALCIUM  7.2*  --  6.7*  --  7.9*  --   --   --   --   --  7.0*  MG  --  1.4*  --   --  2.0  --   --   --   --   --  1.7  PHOS  --   --   --   --   --   --   --   --   --   --  <  1.0*   < > = values in this interval not displayed.   GFR: Estimated Creatinine Clearance: 56.2 mL/min (A) (by C-G formula based on SCr of 0.31 mg/dL (L)). Recent Labs  Lab 09/11/23 0822 09/12/23 0424  WBC 10.2 6.0    Liver Function Tests: Recent Labs  Lab 09/11/23 0822 09/11/23 1200 09/12/23 0424 09/13/23 0338  AST 60* 56* 47*  --   ALT 31 27 24   --   ALKPHOS 95 77 83  --   BILITOT 2.8* 2.3* 1.3*  --   PROT 6.6 5.8* 6.8  --   ALBUMIN 3.8 3.3* 3.6 2.8*   Recent Labs  Lab 09/11/23 0822  LIPASE 29   No results for input(s): AMMONIA in the last 168 hours.  ABG    Component Value Date/Time   PHART 7.52 (H) 07/14/2014 1055   PCO2ART 33 07/14/2014 1055   PO2ART 69 (L) 07/14/2014 1055   HCO3 26.9 07/14/2014 1055   TCO2 24 04/16/2021 0959   ACIDBASEDEF 17.8 (H) 07/13/2014 1108   O2SAT 95.4 07/14/2014 1055     Coagulation Profile: No results for input(s): INR, PROTIME in the last 168 hours.  Cardiac Enzymes: No results for input(s): CKTOTAL, CKMB, CKMBINDEX, TROPONINI in the last 168 hours.  HbA1C: Hemoglobin A1C  Date/Time Value Ref Range Status  03/19/2023 12:25 PM 6.3 (A) 4.0 - 5.6 % Final  05/28/2021 09:43 AM 4.7 4.0  - 5.6 % Final   Hgb A1c MFr Bld  Date/Time Value Ref Range Status  09/11/2023 08:22 AM 4.9 4.8 - 5.6 % Final    Comment:    (NOTE) Diagnosis of Diabetes The following HbA1c ranges recommended by the American Diabetes Association (ADA) may be used as an aid in the diagnosis of diabetes mellitus.  Hemoglobin             Suggested A1C NGSP%              Diagnosis  <5.7                   Non Diabetic  5.7-6.4                Pre-Diabetic  >6.4                   Diabetic  <7.0                   Glycemic control for                       adults with diabetes.    10/03/2018 08:41 AM 5.4 4.8 - 5.6 % Final    Comment:             Prediabetes: 5.7 - 6.4          Diabetes: >6.4          Glycemic control for adults with diabetes: <7.0     CBG: Recent Labs  Lab 09/11/23 1834  GLUCAP 128*    Review of Systems:   Positives in BOLD: Currently denies all complaints  Gen: Denies fever, chills, weight change, fatigue, night sweats HEENT: Denies blurred vision, double vision, hearing loss, tinnitus, sinus congestion, rhinorrhea, sore throat, neck stiffness, dysphagia PULM: Denies shortness of breath, cough, sputum production, hemoptysis, wheezing CV: Denies chest pain, edema, orthopnea, paroxysmal nocturnal dyspnea, palpitations GI: Denies abdominal pain, nausea, vomiting, diarrhea, hematochezia, melena, constipation, change in bowel habits GU: Denies dysuria, hematuria, polyuria, oliguria, urethral  discharge Endocrine: Denies hot or cold intolerance, polyuria, polyphagia or appetite change Derm: Denies rash, dry skin, scaling or peeling skin change Heme: Denies easy bruising, bleeding, bleeding gums Neuro: Denies headache, numbness, weakness, slurred speech, loss of memory or consciousness   Past Medical History:  She,  has a past medical history of Abdominal aortic atherosclerosis (HCC), Abnormal weight loss, Alcohol  abuse, Alcohol  abuse with withdrawal (HCC) (10/13/2020), Alcohol   withdrawal (HCC) (11/29/2022), Alcoholic hepatitis (09/09/2020), Alcoholic ketoacidosis (07/12/2014), Allergic rhinitis, Anxiety, Bronchitis, Chicken pox, Colon polyps, COPD (chronic obstructive pulmonary disease) (HCC), Depression, Dyspnea, Elevated lipase, Fatty liver, Gallbladder mass (09/09/2020), GERD (gastroesophageal reflux disease), Hepatitis, Hernia of abdominal cavity, Hip fracture (HCC) (right), Left upper quadrant abdominal pain, Neuromuscular disorder (HCC), Neuropathy, Thrombocytopenia (HCC), and Wears dentures.   Surgical History:   Past Surgical History:  Procedure Laterality Date   BIOPSY  08/31/2022   Procedure: BIOPSY;  Surgeon: Maryruth Ole DASEN, MD;  Location[facility] ENDOSCOPY;  Service: Endoscopy;;   CATARACT EXTRACTION W/PHACO Right 07/29/2021   Procedure: CATARACT EXTRACTION PHACO AND INTRAOCULAR LENS PLACEMENT (IOC) RIGHT 19.12 01:27.4;  Surgeon: Jaye Fallow, MD;  Location: [facility];  Service: Ophthalmology;  Laterality: Right;   CATARACT EXTRACTION W/PHACO Left 08/12/2021   Procedure: CATARACT EXTRACTION PHACO AND INTRAOCULAR LENS PLACEMENT (IOC) LEFT 13.10 01:05.3;  Surgeon: Jaye Fallow, MD;  Location: [facility];  Service: Ophthalmology;  Laterality: Left;   CHOLECYSTECTOMY     COLONOSCOPY     COLONOSCOPY WITH PROPOFOL  N/A 08/31/2022   Procedure: COLONOSCOPY WITH PROPOFOL ;  Surgeon: Maryruth Ole DASEN, MD;  Location: [facility] ENDOSCOPY;  Service: Endoscopy;  Laterality: N/A;   ESOPHAGOGASTRODUODENOSCOPY (EGD) WITH PROPOFOL  N/A 08/31/2022   Procedure: ESOPHAGOGASTRODUODENOSCOPY (EGD) WITH PROPOFOL ;  Surgeon: Maryruth Ole DASEN, MD;  Location: [facility] ENDOSCOPY;  Service: Endoscopy;  Laterality: N/A;   EYE SURGERY     POLYPECTOMY  08/31/2022   Procedure: POLYPECTOMY;  Surgeon: Maryruth Ole DASEN, MD;  Location: [facility] ENDOSCOPY;  Service: Endoscopy;;   ROBOTIC ASSISTED LAPAROSCOPIC CHOLECYSTECTOMY  04/16/2021   [facility]     Social History:   reports  that she has been smoking cigarettes. She has a 20 pack-year smoking history. She has never used smokeless tobacco. She reports that she does not drink alcohol  and does not use drugs.   Family History:  Her family history includes Alcohol  abuse in an other family member; Cancer (age of onset: 8) in her father; Colon cancer in her paternal grandmother; Hypertension in her mother; Meniere's disease in her mother; Osteoporosis in her mother. There is no history of Breast cancer.   Allergies Allergies  Allergen Reactions   Azithromycin  Other (See Comments)   Buspirone  Hives   Ciprofloxacin Hives   Lyrica  [Pregabalin ] Other (See Comments)    Blurred vision      Home Medications  Prior to Admission medications   Medication Sig Start Date End Date Taking? Authorizing Provider  gabapentin  (NEURONTIN ) 400 MG capsule Take 2 capsules (800 mg total) by mouth 3 (three) times daily. 03/19/23  Yes Marylynn Verneita CROME, MD  omeprazole  (PRILOSEC) 40 MG capsule TAKE ONE CAPSULE BY MOUTH TWO TIMES DAILY 03/19/23  Yes Marylynn Verneita CROME, MD  ondansetron  (ZOFRAN -ODT) 8 MG disintegrating tablet TAKE 1 TABLET BY MOUTH EVERY 8 HOURS AS NEEDED FOR NAUSEA AND VOMITING 05/31/23  Yes Marylynn Verneita CROME, MD  traMADol  (ULTRAM ) 50 MG tablet TAKE ONE TABLET BY MOUTH TWICE DAILY 08/16/23  Yes Tullo, Teresa L, MD  hydrOXYzine  (ATARAX ) 10 MG tablet Take 1 tablet (  10 mg total) by mouth 2 (two) times daily as needed. 03/19/23   Marylynn Verneita CROME, MD     Critical care time: 40 minutes     Inge Lecher, AGACNP-BC [facility] Prefer epic messenger for cross cover needs If after hours, please call E-link  \

## 2023-09-13 NOTE — Discharge Instructions (Signed)

## 2023-09-13 NOTE — Telephone Encounter (Signed)
 Pharmacy Patient Advocate Encounter  Insurance verification completed.    The patient is insured through [employer].     Ran test claim for Eliquis  5mg  tablets and the current 30 day co-pay is $0.00.   This test claim was processed through [facility]- copay amounts may vary at other pharmacies due to pharmacy/plan contracts, or as the patient moves through the different stages of their insurance plan.

## 2023-09-13 NOTE — Telephone Encounter (Signed)
 Spoke we pt and she stated that she is currently in the hospital because her sodium level was low. Pt was requesting a full panel of blood work. I let pt know to give us  a call when she is discharged from the hospital and we could get her scheduled for a follow up and discuss the need for blood work then.

## 2023-09-14 DIAGNOSIS — E871 Hypo-osmolality and hyponatremia: Secondary | ICD-10-CM | POA: Diagnosis not present

## 2023-09-14 LAB — CBC
HCT: 33.2 % — ABNORMAL LOW (ref 36.0–46.0)
Hemoglobin: 12 g/dL (ref 12.0–15.0)
MCH: 33 pg (ref 26.0–34.0)
MCHC: 36.1 g/dL — ABNORMAL HIGH (ref 30.0–36.0)
MCV: 91.2 fL (ref 80.0–100.0)
Platelets: 126 K/uL — ABNORMAL LOW (ref 150–400)
RBC: 3.64 MIL/uL — ABNORMAL LOW (ref 3.87–5.11)
RDW: 14.7 % (ref 11.5–15.5)
WBC: 8.9 K/uL (ref 4.0–10.5)
nRBC: 0 % (ref 0.0–0.2)

## 2023-09-14 LAB — RENAL FUNCTION PANEL
Albumin: 3 g/dL — ABNORMAL LOW (ref 3.5–5.0)
Anion gap: 12 (ref 5–15)
BUN: 10 mg/dL (ref 8–23)
CO2: 20 mmol/L — ABNORMAL LOW (ref 22–32)
Calcium: 8.8 mg/dL — ABNORMAL LOW (ref 8.9–10.3)
Chloride: 103 mmol/L (ref 98–111)
Creatinine, Ser: 0.59 mg/dL (ref 0.44–1.00)
GFR, Estimated: >60 mL/min (ref >60)
Glucose, Bld: 108 mg/dL — ABNORMAL HIGH (ref 70–99)
Phosphorus: 3.7 mg/dL (ref 2.5–4.6)
Potassium: 4.3 mmol/L (ref 3.5–5.1)
Sodium: 135 mmol/L (ref 135–145)

## 2023-09-14 LAB — MAGNESIUM: Magnesium: 1.8 mg/dL (ref 1.7–2.4)

## 2023-09-14 LAB — SODIUM: Sodium: 132 mmol/L — ABNORMAL LOW (ref 135–145)

## 2023-09-14 MED ORDER — HYDROXYZINE HCL 10 MG PO TABS: 10.0000 mg | ORAL_TABLET | Freq: Two times a day (BID) | ORAL | 0 refills | Status: AC | PRN

## 2023-09-14 MED ORDER — ADULT MULTIVITAMIN W/MINERALS CH: 1.0000 | ORAL_TABLET | Freq: Every day | ORAL | Status: AC

## 2023-09-14 NOTE — Discharge Summary (Signed)
 Physician Discharge Summary  Kathy Kathy [name] FMW:[id number] DOB: 07-12-1960 DOA: 09/11/2023  PCP: Kathy Kathy [name], Kathy [name]  Admit date: 09/11/2023 Discharge date: 09/14/2023  Admitted From: Home  Discharge disposition: Home   Recommendations for Outpatient Follow-Up:   Follow up with your primary care provider in one week.  Check CBC, CMP, magnesium  in the next visit   Discharge Diagnosis:   Principal Problem:   Hyponatremia Active Problems:   Alcohol  dependence with alcohol -induced mood disorder (HCC)   Hypokalemia   Fatty liver, alcoholic   Hypomagnesemia   Tobacco abuse   Atrophy of left kidney   GAD (generalized anxiety disorder)   Thrombocytopenia (HCC)   Tachycardia   Hypocalcemia   Atrial flutter by electrocardiogram [facility])   Discharge Condition: Improved.  Diet recommendation:   Regular.  Wound care: None.  Code status: Full.   History of Present Illness:   63 yo female with a PMH of ETOH abuse, alcoholic hepatitis, COPD, and thrombocytopenia presented to [facility] from home with 2 to 3-day history of weakness, nausea vomiting and diarrhea with abdominal cramps.  In the ED patient was noted to have significant hypovolemic hyponatremia with sodium of 113 with potassium low at 3.3.  Magnesium  low at 1.4.  AST slightly elevated at 60 with a total bilirubin at 2.8.  Platelets were low at 133.  Patient was tremulous and anxious..  Patient was then admitted to the ICU for and under went hypertonic saline administration.  Hospital course  was complicated by alcohol  withdrawal requiring Precedex  on 09/13/2023.Kathy [name]  At this time patient has remained stable and has been transferred out of the ICU.   Hospital Course:   Following conditions were addressed during hospitalization as listed below,  ETOH abuse hx but denies drinking since November 2024 Anxiety/Tremors concerning for developing Alcohol  Withdrawal.  Urine drug screen was negative.  Has improved at this time.  Needed  Precedex  drip.  Currently no tremors.  Received CIWA protocol.  Continue thiamine  folic acid  multivitamins.  Continue antibiotics on discharge.  Severe hyponatremia, likely secondary to ETOH abuse and volume depletion.  Initially received hypertonic saline.Sodium level has improved.  Latest sodium of 132.  Nephrology was consulted during hospitalization.  Patient was advised against alcohol  usage, excessive water drinking.  Recommend follow-up BMP in few days with PCP.  Hypokalemia  Has resolved after replacement.  Latest potassium of 4.3.  Hypomagnesemia Improved.  Latest magnesium  1.8.  #Hypocalcemia  #Metabolic acidosis   Improved.  Hypophosphatemia replenished.  Elevated LFT. Patient denies current drinking but family noted empty bottles at home.  Communicated with the patient's daughter again today who states that she is drinking alcohol  at home.  Patient was upset about the topic.   Atrial Flutter, rate controlled 2D echocardiogram with LV ejection fraction of 55 to 60% with grade 2 diastolic dysfunction.   Disposition.  At this time, patient is stable for disposition home with outpatient PCP follow-up.  Spoke with the patient's daughter prior to disposition.  Medical Consultants:   None.  Procedures:    None Subjective:   Today, patient was seen and examined at bedside.  Patient states that she feels better.  Denies any tremors hallucinations dizziness lightheadedness nausea vomiting.  Has been able to tolerate oral diet  Discharge Exam:   Vitals:   09/14/23 0900 09/14/23 1000  BP:  113/70  Pulse: 95   Resp: (!) 21 19  Temp:    SpO2: 100%    Vitals:   09/14/23 0700 09/14/23  0800 09/14/23 0900 09/14/23 1000  BP:  (!) 141/78  113/70  Pulse: 91 91 95   Resp: 13 18 (!) 21 19  Temp:      TempSrc:      SpO2: 100% 99% 100%   Weight:      Height:       Body mass index is 20.44 kg/m.  General: Alert awake, not in obvious distress, thinly built. HENT: pupils  equally reacting to light,  No scleral pallor or icterus noted. Oral mucosa is moist.  Chest:   Diminished breath sounds bilaterally. No crackles or wheezes.  CVS: S1 &S2 heard. No murmur.  Regular rate and rhythm. Abdomen: Soft, nontender, nondistended.  Bowel sounds are heard.   Extremities: No cyanosis, clubbing or edema.  Peripheral pulses are palpable. Psych: Alert, awake and oriented, normal mood CNS:  No cranial nerve deficits.  Power equal in all extremities.   Skin: Warm and dry.  No rashes noted.  The results of significant diagnostics from this hospitalization (including imaging, microbiology, ancillary and laboratory) are listed below for reference.     Diagnostic Studies:   ECHOCARDIOGRAM COMPLETE Result Date: 09/12/2023    ECHOCARDIOGRAM REPORT   Patient Name:   Kathy Kathy [name] Date of Exam: 09/12/2023 Medical Rec #:  [id number]           Height:       62.0 in Accession #:    [id number]          Weight:       106.3 lb Date of Birth:  [birth year]           BSA:          1.461 m Patient Age:    62 years            BP:           83/56 mmHg Patient Gender: F                   HR:           56 bpm. Exam Location:  [facility] Procedure: 2D Echo, Cardiac Doppler and Color Doppler (Both Spectral and Color            Flow Doppler were utilized during procedure). Indications:     Abnormal ECG R94.31  History:         Patient has prior history of Echocardiogram examinations, most                  recent 12/06/2022.  Sonographer:     Kathy Kathy [name], Kathy [name] Referring Phys:  [id number] Kathy Kathy [name] Diagnosing Phys: Kathy Kathy [name] IMPRESSIONS  1. Left ventricular ejection fraction, by estimation, is 55 to 60%. The left ventricle has normal function. The left ventricle has no regional wall motion abnormalities. There is mild left ventricular hypertrophy. Left ventricular diastolic parameters are consistent with Grade II diastolic dysfunction (pseudonormalization).  2. Right ventricular systolic  function is normal. The right ventricular size is normal.  3. The mitral valve is normal in structure. Mild mitral valve regurgitation.  4. The aortic valve is normal in structure. Aortic valve regurgitation is not visualized. FINDINGS  Left Ventricle: Left ventricular ejection fraction, by estimation, is 55 to 60%. The left ventricle has normal function. The left ventricle has no regional wall motion abnormalities. Strain was performed and the global longitudinal strain is indeterminate. The left ventricular internal cavity size was normal in size. There is mild left  ventricular hypertrophy. Left ventricular diastolic parameters are consistent with Grade II diastolic dysfunction (pseudonormalization). Right Ventricle: The right ventricular size is normal. No increase in right ventricular wall thickness. Right ventricular systolic function is normal. Left Atrium: Left atrial size was normal in size. Right Atrium: Right atrial size was normal in size. Pericardium: There is no evidence of pericardial effusion. Mitral Valve: The mitral valve is normal in structure. Mild mitral valve regurgitation. Tricuspid Valve: The tricuspid valve is normal in structure. Tricuspid valve regurgitation is mild. Aortic Valve: The aortic valve is normal in structure. Aortic valve regurgitation is not visualized. Aortic valve peak gradient measures 3.1 mmHg. Pulmonic Valve: The pulmonic valve was normal in structure. Pulmonic valve regurgitation is not visualized. Aorta: The ascending aorta was not well visualized. IAS/Shunts: No atrial level shunt detected by color flow Doppler. Additional Comments: 3D was performed not requiring image post processing on an independent workstation and was indeterminate.  LEFT VENTRICLE PLAX 2D LVIDd:         3.10 cm   Diastology LVIDs:         2.20 cm   LV e' medial:    6.53 cm/s LV PW:         1.40 cm   LV E/e' medial:  14.4 LV IVS:        1.10 cm   LV e' lateral:   6.42 cm/s LVOT diam:     1.80 cm    LV E/e' lateral: 14.6 LV SV:         55 LV SV Index:   38 LVOT Area:     2.54 cm  RIGHT VENTRICLE RV Basal diam:  2.90 cm RV S prime:     9.79 cm/s TAPSE (M-mode): 1.8 cm LEFT ATRIUM           Index        RIGHT ATRIUM           Index LA diam:      2.40 cm 1.64 cm/m   RA Area:     11.80 cm LA Vol (A2C): 36.3 ml 24.84 ml/m  RA Volume:   24.70 ml  16.90 ml/m LA Vol (A4C): 30.8 ml 21.08 ml/m  AORTIC VALVE                 PULMONIC VALVE AV Area (Vmax): 2.16 cm     PV Vmax:        0.57 m/s AV Vmax:        88.40 cm/s   PV Peak grad:   1.3 mmHg AV Peak Grad:   3.1 mmHg     RVOT Peak grad: 1 mmHg LVOT Vmax:      75.00 cm/s LVOT Vmean:     49.000 cm/s LVOT VTI:       0.217 m  AORTA Ao Root diam: 3.50 cm Ao Asc diam:  3.30 cm MITRAL VALVE               TRICUSPID VALVE MV Area (PHT): 2.92 cm    TR Peak grad:   27.0 mmHg MV Decel Time: 260 msec    TR Vmax:        260.00 cm/s MV E velocity: 93.80 cm/s MV A velocity: 87.00 cm/s  SHUNTS MV E/A ratio:  1.08        Systemic VTI:  0.22 m  Systemic Diam: 1.80 cm Kathy Kathy [name] Electronically signed by Kathy Kathy [name] Signature Date/Time: 09/12/2023/9:30:18 AM    Final      Labs:   Basic Metabolic Panel: Recent Labs  Lab 09/11/23 0823 09/11/23 1200 09/11/23 1656 09/12/23 0424 09/12/23 0732 09/13/23 9661 09/13/23 1005 09/13/23 1412 09/13/23 1814 09/13/23 2223 09/14/23 0333 09/14/23 0733  NA  --  114*   < > 131*   < > 120* 128* 131* 131* 134* 135 132*  K  --  3.2*  --  2.7*   < > 3.0* 4.3  --   --   --  4.3  --   CL  --  81*  --  98  --  89* 93*  --   --   --  103  --   CO2  --  20*  --  24  --  23 24  --   --   --  20*  --   GLUCOSE  --  115*  --  144*  --  137* 122*  --   --   --  108*  --   BUN  --  <5*  --  <5*  --  7* 6*  --   --   --  10  --   CREATININE  --  0.44  --  0.49  --  0.31* 0.37*  --   --   --  0.59  --   CALCIUM   --  6.7*  --  7.9*  --  7.0* 7.6*  --   --   --  8.8*  --   MG 1.4*  --   --  2.0  --   1.7  --   --   --   --  1.8  --   PHOS  --   --   --   --   --  <1.0* 3.2  --   --   --  3.7  --    < > = values in this interval not displayed.   GFR Estimated Creatinine Clearance: 58.4 mL/min (by C-G formula based on SCr of 0.59 mg/dL). Liver Function Tests: Recent Labs  Lab 09/11/23 0822 09/11/23 1200 09/12/23 0424 09/13/23 0338 09/13/23 1005 09/14/23 0333  AST 60* 56* 47*  --   --   --   ALT 31 27 24   --   --   --   ALKPHOS 95 77 83  --   --   --   BILITOT 2.8* 2.3* 1.3*  --   --   --   PROT 6.6 5.8* 6.8  --   --   --   ALBUMIN 3.8 3.3* 3.6 2.8* 3.6 3.0*   Recent Labs  Lab 09/11/23 0822  LIPASE 29   No results for input(s): AMMONIA in the last 168 hours. Coagulation profile No results for input(s): INR, PROTIME in the last 168 hours.  CBC: Recent Labs  Lab 09/11/23 0822 09/12/23 0424 09/14/23 0333  WBC 10.2 6.0 8.9  HGB 12.9 12.7 12.0  HCT 34.7* 34.4* 33.2*  MCV 86.5 88.2 91.2  PLT 133* 113* 126*   Cardiac Enzymes: No results for input(s): CKTOTAL, CKMB, CKMBINDEX, TROPONINI in the last 168 hours. BNP: Invalid input(s): POCBNP CBG: Recent Labs  Lab 09/11/23 1834  GLUCAP 128*   D-Dimer No results for input(s): DDIMER in the last 72 hours. Hgb A1c No results for input(s): HGBA1C in the last 72 hours. Lipid Profile  No results for input(s): CHOL, HDL, LDLCALC, TRIG, CHOLHDL, LDLDIRECT in the last 72 hours. Thyroid  function studies No results for input(s): TSH, T4TOTAL, T3FREE, THYROIDAB in the last 72 hours.  Invalid input(s): FREET3 Anemia work up No results for input(s): VITAMINB12, FOLATE, FERRITIN, TIBC, IRON , RETICCTPCT in the last 72 hours. Microbiology Recent Results (from the past 240 hours)  Resp panel by RT-PCR (RSV, Flu A&B, Covid) Anterior Nasal Swab     Status: None   Collection Time: 09/11/23  9:32 AM   Specimen: Anterior Nasal Swab  Result Value Ref Range Status   SARS  Coronavirus 2 by RT PCR NEGATIVE NEGATIVE Final    Comment: (NOTE) SARS-CoV-2 target nucleic acids are NOT DETECTED.  The SARS-CoV-2 RNA is generally detectable in upper respiratory specimens during the acute phase of infection. The lowest concentration of SARS-CoV-2 viral copies this assay can detect is 138 copies/mL. A negative result does not preclude SARS-Cov-2 infection and should not be used as the sole basis for treatment or other patient management decisions. A negative result may occur with  improper specimen collection/handling, submission of specimen other than nasopharyngeal swab, presence of viral mutation(s) within the areas targeted by this assay, and inadequate number of viral copies(<138 copies/mL). A negative result must be combined with clinical observations, patient history, and epidemiological information. The expected result is Negative.  Fact Sheet for Patients:  BloggerCourse.com  Fact Sheet for Healthcare Providers:  SeriousBroker.it  This test is no t yet approved or cleared by the [place]  FDA and  has been authorized for detection and/or diagnosis of SARS-CoV-2 by FDA under an Emergency Use Authorization (EUA). This EUA will remain  in effect (meaning this test can be used) for the duration of the COVID-19 declaration under Section 564(b)(1) of the Act, 21 U.S.C.section 360bbb-3(b)(1), unless the authorization is terminated  or revoked sooner.       Influenza A by PCR NEGATIVE NEGATIVE Final   Influenza B by PCR NEGATIVE NEGATIVE Final    Comment: (NOTE) The Xpert Xpress SARS-CoV-2/FLU/RSV plus assay is intended as an aid in the diagnosis of influenza from Nasopharyngeal swab specimens and should not be used as a sole basis for treatment. Nasal washings and aspirates are unacceptable for Xpert Xpress SARS-CoV-2/FLU/RSV testing.  Fact Sheet for  Patients: BloggerCourse.com  Fact Sheet for Healthcare Providers: SeriousBroker.it  This test is not yet approved or cleared by the [place]  FDA and has been authorized for detection and/or diagnosis of SARS-CoV-2 by FDA under an Emergency Use Authorization (EUA). This EUA will remain in effect (meaning this test can be used) for the duration of the COVID-19 declaration under Section 564(b)(1) of the Act, 21 U.S.C. section 360bbb-3(b)(1), unless the authorization is terminated or revoked.     Resp Syncytial Virus by PCR NEGATIVE NEGATIVE Final    Comment: (NOTE) Fact Sheet for Patients: BloggerCourse.com  Fact Sheet for Healthcare Providers: SeriousBroker.it  This test is not yet approved or cleared by the [place]  FDA and has been authorized for detection and/or diagnosis of SARS-CoV-2 by FDA under an Emergency Use Authorization (EUA). This EUA will remain in effect (meaning this test can be used) for the duration of the COVID-19 declaration under Section 564(b)(1) of the Act, 21 U.S.C. section 360bbb-3(b)(1), unless the authorization is terminated or revoked.  Performed at [facility], [address], [place], [place] [address]   MRSA Next Gen by PCR, Nasal     Status: None   Collection Time: 09/11/23  6:30 PM   Specimen: Nasal Mucosa; Nasal Swab  Result Value Ref Range Status   MRSA by PCR Next Gen NOT DETECTED NOT DETECTED Final    Comment: (NOTE) The GeneXpert MRSA Assay (FDA approved for NASAL specimens only), is one component of a comprehensive MRSA colonization surveillance program. It is not intended to diagnose MRSA infection nor to guide or monitor treatment for MRSA infections. Test performance is not FDA approved in patients less than 46 years old. Performed at [facility], [address], [place], [place] [address]       Discharge Instructions:   Discharge Instructions     Ambulatory Referral for Lung Cancer Scre   Complete by: As directed    Call Kathy [name] for:  persistant nausea and vomiting   Complete by: As directed    Call Kathy [name] for:  redness, tenderness, or signs of infection (pain, swelling, redness, odor or green/yellow discharge around incision site)   Complete by: As directed    Call Kathy [name] for:  severe uncontrolled pain   Complete by: As directed    Call Kathy [name] for:  temperature >100.4   Complete by: As directed    Diet general   Complete by: As directed    Discharge instructions   Complete by: As directed    Follow up with your primary care provider in one week. Do not take alcohol . Seek medical attention for worsening symptoms.   Increase activity slowly   Complete by: As directed       Allergies as of 09/14/2023       Reactions   Azithromycin  Other (See Comments)   Buspirone  Hives   Ciprofloxacin Hives   Lyrica  [pregabalin ] Other (See Comments)   Blurred vision         Medication List     TAKE these medications    gabapentin  400 MG capsule Commonly known as: NEURONTIN  Take 2 capsules (800 mg total) by mouth 3 (three) times daily.   hydrOXYzine  10 MG tablet Commonly known as: ATARAX  Take 1 tablet (10 mg total) by mouth 2 (two) times daily as needed for anxiety. What changed: reasons to take this   multivitamin with minerals Tabs tablet Take 1 tablet by mouth daily. Start taking on: September 15, 2023   omeprazole  40 MG capsule Commonly known as: PRILOSEC TAKE ONE CAPSULE BY MOUTH TWO TIMES DAILY   ondansetron  8 MG disintegrating tablet Commonly known as: ZOFRAN -ODT TAKE 1 TABLET BY MOUTH EVERY 8 HOURS AS NEEDED FOR NAUSEA AND VOMITING   traMADol  50 MG tablet Commonly known as: ULTRAM  TAKE ONE TABLET BY MOUTH TWICE DAILY        Follow-up Information     Kathy Kathy [name], Kathy [name] Follow up on 09/20/2023.   Specialty: Internal Medicine Why: monday 28, 2025 at [facility]  information: [address] [place] [address] [phone]                  Time coordinating discharge: 39 minutes  Signed:  Avel Ogawa  [facility] 09/14/2023, 2:29 PM

## 2023-09-14 NOTE — Plan of Care (Signed)
  Problem: Clinical Measurements: Goal: Ability to maintain clinical measurements within normal limits will improve Outcome: Progressing   Problem: Activity: Goal: Risk for activity intolerance will decrease Outcome: Progressing   Problem: Nutrition: Goal: Adequate nutrition will be maintained Outcome: Progressing   Problem: Coping: Goal: Level of anxiety will decrease Outcome: Progressing   Problem: Safety: Goal: Ability to remain free from injury will improve Outcome: Progressing   

## 2023-09-14 NOTE — Progress Notes (Signed)
 [facility]  PROGRESS NOTE   Subjective:   Patient seen laying in bed Alert and oriented, cheerful Room air  Sodium 132   Objective:  Vital signs: Blood pressure 113/70, pulse 95, temperature 99.3 F (37.4 C), temperature source Oral, resp. rate 19, height 5' 2 (1.575 m), weight 50.7 kg, SpO2 100%.  Intake/Output Summary (Last 24 hours) at 09/14/2023 1350 Last data filed at 09/14/2023 1039 Gross per 24 hour  Intake 1204.89 ml  Output --  Net 1204.89 ml   Filed Weights   09/11/23 0819 09/11/23 1820 09/14/23 0500  Weight: 48.1 kg 48.2 kg 50.7 kg     Physical Exam: General:  No acute distress  Head:  Normocephalic, atraumatic. Moist oral mucosal membranes  Eyes:  Anicteric  Neck:  Supple  Lungs:   Clear to auscultation, normal effort  Heart:  S1S2 no rubs  Abdomen:   Soft, nontender, bowel sounds present  Extremities:  No peripheral edema.  Neurologic:  Awake, alert, following commands  Skin:  No lesions  Access: None    Basic Metabolic Panel: Recent Labs  Lab 09/11/23 0823 09/11/23 1200 09/11/23 1656 09/12/23 0424 09/12/23 0732 09/12/23 1543 09/12/23 1920 09/13/23 0338 09/13/23 1005 09/13/23 1412 09/13/23 1814 09/13/23 2223 09/14/23 0333 09/14/23 0733  NA  --  114*   < > 131*   < > 122*   < > 120* 128* 131* 131* 134* 135 132*  K  --  3.2*  --  2.7*  --  4.1  --  3.0* 4.3  --   --   --  4.3  --   CL  --  81*  --  98  --   --   --  89* 93*  --   --   --  103  --   CO2  --  20*  --  24  --   --   --  23 24  --   --   --  20*  --   GLUCOSE  --  115*  --  144*  --   --   --  137* 122*  --   --   --  108*  --   BUN  --  <5*  --  <5*  --   --   --  7* 6*  --   --   --  10  --   CREATININE  --  0.44  --  0.49  --   --   --  0.31* 0.37*  --   --   --  0.59  --   CALCIUM   --  6.7*  --  7.9*  --   --   --  7.0* 7.6*  --   --   --  8.8*  --   MG 1.4*  --   --  2.0  --   --   --  1.7  --   --   --   --  1.8  --   PHOS  --   --   --   --   --   --    --  <1.0* 3.2  --   --   --  3.7  --    < > = values in this interval not displayed.   GFR: Estimated Creatinine Clearance: 58.4 mL/min (by C-G formula based on SCr of 0.59 mg/dL).  Liver Function Tests: Recent Labs  Lab 09/11/23 9177 09/11/23 1200 09/12/23 0424 09/13/23 9661  09/13/23 1005 09/14/23 0333  AST 60* 56* 47*  --   --   --   ALT 31 27 24   --   --   --   ALKPHOS 95 77 83  --   --   --   BILITOT 2.8* 2.3* 1.3*  --   --   --   PROT 6.6 5.8* 6.8  --   --   --   ALBUMIN 3.8 3.3* 3.6 2.8* 3.6 3.0*   Recent Labs  Lab 09/11/23 0822  LIPASE 29   No results for input(s): AMMONIA in the last 168 hours.  CBC: Recent Labs  Lab 09/11/23 0822 09/12/23 0424 09/14/23 0333  WBC 10.2 6.0 8.9  HGB 12.9 12.7 12.0  HCT 34.7* 34.4* 33.2*  MCV 86.5 88.2 91.2  PLT 133* 113* 126*     HbA1C: Hemoglobin A1C  Date/Time Value Ref Range Status  03/19/2023 12:25 PM 6.3 (A) 4.0 - 5.6 % Final  05/28/2021 09:43 AM 4.7 4.0 - 5.6 % Final   Hgb A1c MFr Bld  Date/Time Value Ref Range Status  09/11/2023 08:22 AM 4.9 4.8 - 5.6 % Final    Comment:    (NOTE) Diagnosis of Diabetes The following HbA1c ranges recommended by the American Diabetes Association (ADA) may be used as an aid in the diagnosis of diabetes mellitus.  Hemoglobin             Suggested A1C NGSP%              Diagnosis  <5.7                   Non Diabetic  5.7-6.4                Pre-Diabetic  >6.4                   Diabetic  <7.0                   Glycemic control for                       adults with diabetes.    10/03/2018 08:41 AM 5.4 4.8 - 5.6 % Final    Comment:             Prediabetes: 5.7 - 6.4          Diabetes: >6.4          Glycemic control for adults with diabetes: <7.0     Urinalysis: Recent Labs    09/11/23 1440  COLORURINE STRAW*  LABSPEC 1.001*  PHURINE 7.0  GLUCOSEU NEGATIVE  HGBUR SMALL*  BILIRUBINUR NEGATIVE  KETONESUR 5*  PROTEINUR NEGATIVE  NITRITE NEGATIVE   LEUKOCYTESUR NEGATIVE      Imaging: No results found.    Medications:      Chlorhexidine  Gluconate Cloth  6 each Topical Daily   [START ON 11/20/2023] denosumab   60 mg Subcutaneous Q6 months   feeding supplement  237 mL Oral BID BM   folic acid   1 mg Oral Daily   gabapentin   400 mg Oral BID   multivitamin with minerals  1 tablet Oral Daily   nicotine   21 mg Transdermal Daily   pantoprazole   40 mg Oral Daily   QUEtiapine   50 mg Oral QHS   thiamine   100 mg Oral Daily   Or   thiamine   100 mg Intravenous Daily    Assessment/ Plan:  63 y.o. female with a PMHx of alcohol  abuse history, thrombocytopenia, prediabetes, anemia, fatty liver, atrophic left kidney now admitted with history of 2 to 3 days of nausea vomiting.  She was found to have a sodium of 113.  As per the family patient has been drinking even recently.   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  Patient was initially started on 3% but was stopped and given DDAVP  with D5W.   Sodium 132.  IV fluids stopped yesterday due to correction.  Patient continued to self-correct overnight.  No further interventions required at this time.  #2: Hypokalemia: This is most likely secondary to vomiting complicated by alcohol  use.  Potassium corrected.   #3: Hypomagnesemia: Most likely secondary to alcohol  abuse.  Continue IV supplementation   #4: Alcohol  withdrawal protocol: Patient is on withdrawal observation. CIWA protocol in place.  Continue with multivitamin and thiamine .  #5: Atrophic left kidney: Renal indices are stable.  Left kidney is not  functional, severely atrophic on CT abd/pelvis.     LOS: 3 [facility] 7/22/20251:50 PM

## 2023-09-15 ENCOUNTER — Telehealth: Payer: Self-pay

## 2023-09-15 NOTE — Transitions of Care (Post Inpatient/ED Visit) (Signed)
   09/15/2023  Name: Prisila Dlouhy MRN: [id number] DOB: [birth year]  Today's TOC FU Call Status: Today's TOC FU Call Status:: Unsuccessful Call (1st Attempt) Unsuccessful Call (1st Attempt) Date: 09/15/23  Attempted to reach the patient regarding the most recent Inpatient/ED visit.  Follow Up Plan: Additional outreach attempts will be made to reach the patient to complete the Transitions of Care (Post Inpatient/ED visit) call.   Arvin Seip RN, BSN, CCM [employer], Population Health Case Manager Phone: [phone]

## 2023-09-16 ENCOUNTER — Telehealth: Payer: Self-pay

## 2023-09-16 LAB — DRUG SCREEN 764883 11+OXYCO+ALC+CRT-BUND
Amphetamines, Urine: NEGATIVE ng/mL
BENZODIAZ UR QL: NEGATIVE ng/mL
Barbiturate screen, urine: NEGATIVE ng/mL
Cannabinoid Quant, Ur: NEGATIVE ng/mL
Cocaine (Metab.): NEGATIVE ng/mL
Creatinine, Urine: 7.6 mg/dL — ABNORMAL LOW (ref 20.0–300.0)
Ethanol U, Quan: NEGATIVE %
Meperidine: NEGATIVE ng/mL
Methadone Screen, Urine: NEGATIVE ng/mL
Nitrite Urine, Quantitative: NEGATIVE ug/mL
OPIATE SCREEN URINE: NEGATIVE ng/mL
Oxycodone/Oxymorphone, Urine: NEGATIVE ng/mL
Phencyclidine, Ur: NEGATIVE ng/mL
Propoxyphene, Urine: NEGATIVE ng/mL
pH, Urine: 6.3 (ref 4.5–8.9)

## 2023-09-16 LAB — SPECIFIC GRAVITY: Specific Gravity: 1.0025

## 2023-09-16 LAB — TRAMADOL GC/MS, URINE
Tramadol gc/ms Conf: 588 ng/mL
Tramadol: POSITIVE — AB

## 2023-09-16 NOTE — Transitions of Care (Post Inpatient/ED Visit) (Signed)
   09/16/2023  Name: Kathy [name] MRN: [id number] DOB: 11/06/1960  Today's TOC FU Call Status: Today's TOC FU Call Status:: Unsuccessful Call (2nd Attempt) Unsuccessful Call (2nd Attempt) Date: 09/16/23  Attempted to reach the patient regarding the most recent Inpatient/ED visit.  Follow Up Plan: Additional outreach attempts will be made to reach the patient to complete the Transitions of Care (Post Inpatient/ED visit) call.   Arvin Seip RN, BSN, CCM [employer], Population Health Case Manager Phone: [phone]

## 2023-09-17 ENCOUNTER — Telehealth: Payer: Self-pay | Admitting: *Deleted

## 2023-09-17 NOTE — Transitions of Care (Post Inpatient/ED Visit) (Signed)
   09/17/2023  Name: Jameelah Watts MRN: [id number] DOB: [birth year]  Today's TOC FU Call Status: Today's TOC FU Call Status:: Successful TOC FU Call Completed TOC FU Call Complete Date: 09/17/23 Patient's Name and Date of Birth confirmed.  Transition Care Management Follow-up Telephone Call Date of Discharge: 09/14/23 Discharge Facility: [facility]) Type of Discharge: Inpatient Admission Primary Inpatient Discharge Diagnosis:: Hyponatremia How have you been since you were released from the hospital?: Better Any questions or concerns?: No  Items Reviewed: Did you receive and understand the discharge instructions provided?: Yes Medications obtained,verified, and reconciled?: No (pt declined, states  I have everything, I'm good) Any new allergies since your discharge?: No Dietary orders reviewed?: No (pt declined) Do you have support at home?: Yes People in Home [RPT]: child(ren), adult Name of Support/Comfort Primary Source: pt states I live with my children Unable to complete assessments, pt declined  Medications Reviewed Today: Medications Reviewed Today   Medications were not reviewed in this encounter   Unable to review medications, pt declined  Home Care and Equipment/Supplies: Were Home Health Services Ordered?: No Any new equipment or medical supplies ordered?: No  Functional Questionnaire: Do you need assistance with bathing/showering or dressing?: No Do you need assistance with meal preparation?: No Do you need assistance with eating?: No Do you have difficulty maintaining continence: No Do you need assistance with getting out of bed/getting out of a chair/moving?: No Do you have difficulty managing or taking your medications?: No  Follow up appointments reviewed: PCP Follow-up appointment confirmed?: Yes Date of PCP follow-up appointment?: 09/20/23 Follow-up Provider: Verneita Kettering MD  @ [facility] Follow-up  appointment confirmed?: NA Do you need transportation to your follow-up appointment?: No Do you understand care options if your condition(s) worsen?: Yes-patient verbalized understanding   Mliss Creed [facility], BSN RN Care Manager/ Transition of Care [place]/ [facility] Population Health [phone]

## 2023-09-20 ENCOUNTER — Ambulatory Visit (INDEPENDENT_AMBULATORY_CARE_PROVIDER_SITE_OTHER): Admitting: Internal Medicine

## 2023-09-20 ENCOUNTER — Encounter: Payer: Self-pay | Admitting: Internal Medicine

## 2023-09-20 VITALS — BP 138/74 | HR 96 | Ht 62.0 in | Wt 104.2 lb

## 2023-09-20 DIAGNOSIS — L299 Pruritus, unspecified: Secondary | ICD-10-CM

## 2023-09-20 DIAGNOSIS — G621 Alcoholic polyneuropathy: Secondary | ICD-10-CM

## 2023-09-20 DIAGNOSIS — I700 Atherosclerosis of aorta: Secondary | ICD-10-CM | POA: Diagnosis not present

## 2023-09-20 DIAGNOSIS — F5105 Insomnia due to other mental disorder: Secondary | ICD-10-CM

## 2023-09-20 DIAGNOSIS — E441 Mild protein-calorie malnutrition: Secondary | ICD-10-CM | POA: Diagnosis not present

## 2023-09-20 DIAGNOSIS — F330 Major depressive disorder, recurrent, mild: Secondary | ICD-10-CM | POA: Diagnosis not present

## 2023-09-20 DIAGNOSIS — F409 Phobic anxiety disorder, unspecified: Secondary | ICD-10-CM

## 2023-09-20 DIAGNOSIS — E860 Dehydration: Secondary | ICD-10-CM

## 2023-09-20 DIAGNOSIS — D696 Thrombocytopenia, unspecified: Secondary | ICD-10-CM | POA: Diagnosis not present

## 2023-09-20 DIAGNOSIS — M810 Age-related osteoporosis without current pathological fracture: Secondary | ICD-10-CM

## 2023-09-20 DIAGNOSIS — E871 Hypo-osmolality and hyponatremia: Secondary | ICD-10-CM

## 2023-09-20 DIAGNOSIS — Z1231 Encounter for screening mammogram for malignant neoplasm of breast: Secondary | ICD-10-CM

## 2023-09-20 DIAGNOSIS — F1024 Alcohol dependence with alcohol-induced mood disorder: Secondary | ICD-10-CM

## 2023-09-20 DIAGNOSIS — Z09 Encounter for follow-up examination after completed treatment for conditions other than malignant neoplasm: Secondary | ICD-10-CM

## 2023-09-20 MED ORDER — OMEPRAZOLE 40 MG PO CPDR: DELAYED_RELEASE_CAPSULE | ORAL | 1 refills | Status: AC

## 2023-09-20 MED ORDER — TRAMADOL HCL 50 MG PO TABS: 50.0000 mg | ORAL_TABLET | Freq: Two times a day (BID) | ORAL | 5 refills | Status: DC

## 2023-09-20 NOTE — Assessment & Plan Note (Signed)
 Controlled with gabapentin  and hydroxyzine  . Ok to increase hydroxyzine  to 20 mg at bedtime

## 2023-09-20 NOTE — Assessment & Plan Note (Signed)
 Recurrent,  due to alcoholism . Repeat is due   Lab Results  Component Value Date   WBC 8.9 09/14/2023   HGB 12.0 09/14/2023   HCT 33.2 (L) 09/14/2023   MCV 91.2 09/14/2023   PLT 126 (L) 09/14/2023

## 2023-09-20 NOTE — Assessment & Plan Note (Signed)
 Chronic, aggravated by alcoholism and nausea/vomiting.  Received hypertonis saline in ICU for soidum of 113.  Repeat sodium  is due today after discharge on July 22. Potassium and magnesium   also pending   Lab Results  Component Value Date   NA 132 (L) 09/14/2023   K 4.3 09/14/2023   CL 103 09/14/2023   CO2 20 (L) 09/14/2023

## 2023-09-20 NOTE — Progress Notes (Unsigned)
 Subjective:  Patient ID: Kathy [name], female    DOB: 04/18/1960  Age: 63 y.o. MRN: [id number]  CC: The primary encounter diagnosis was Hyponatremia. Diagnoses of Encounter for screening mammogram for malignant neoplasm of breast, Thrombocytopenia (HCC), Itching, Hospital discharge follow-up, and Alcoholic peripheral neuropathy (HCC) were also pertinent to this visit.   HPI Kathy [name] presents for  Chief Complaint  Patient presents with   Hospitalization Follow-up   Kathy [name] is a 63 yr old female with a history of alcohol  dependence, history of alcohol  withdrawal and alcoholic neuropathy  who was hospitalized July 19-22 with severe hyponatremia, hypokalemia, hypomagnesemia, following a 3 day  GI illness with N/V/D. And excessive water intake.  Adission sodium was 113,  urinary osmo was 87 .   Was diagnosed with alcohol  withdrawal based on development of tremors, report of empty wine coolers by daughter., and negative alcohol  screen.  She was admitted to ICU for CIWA protocol and infused with  hypertonic saline.  Was discharged home in improved condition , appetite has been excellent.  Trying to salt her food  but   Alcohol  abuse:   she  has rationalized that  wine coolers are acceptable because they are not liquor. She attributes her slip to the stress of taking care of her elderly parents. L since discharge her Kathy [name] has been supportive and she has been attending AA TWICE weekly with her daughter's employer who is 10 years sober. e,    Still having cramps in feet (chronic  (but no other place)    Using hydroxyzine   for itching and sleep  ok to use 20 mg currently       Outpatient Medications Prior to Visit  Medication Sig Dispense Refill   gabapentin  (NEURONTIN ) 400 MG capsule Take 2 capsules (800 mg total) by mouth 3 (three) times daily. 180 capsule 2   hydrOXYzine  (ATARAX ) 10 MG tablet Take 1 tablet (10 mg total) by mouth 2 (two) times daily as needed for anxiety. 60  tablet 0   Multiple Vitamin (MULTIVITAMIN WITH MINERALS) TABS tablet Take 1 tablet by mouth daily.     ondansetron  (ZOFRAN -ODT) 8 MG disintegrating tablet TAKE 1 TABLET BY MOUTH EVERY 8 HOURS AS NEEDED FOR NAUSEA AND VOMITING 30 tablet 2   omeprazole  (PRILOSEC) 40 MG capsule TAKE ONE CAPSULE BY MOUTH TWO TIMES DAILY 180 capsule 1   traMADol  (ULTRAM ) 50 MG tablet TAKE ONE TABLET BY MOUTH TWICE DAILY 60 tablet 0   Facility-Administered Medications Prior to Visit  Medication Dose Route Frequency Provider Last Rate Last Admin   [START ON 11/20/2023] denosumab  (PROLIA ) injection 60 mg  60 mg Subcutaneous Q6 months Arnett, Rollene MATSU, FNP        Review of Systems;  Patient denies headache, fevers, malaise, unintentional weight loss, skin rash, eye pain, sinus congestion and sinus pain, sore throat, dysphagia,  hemoptysis , cough, dyspnea, wheezing, chest pain, palpitations, orthopnea, edema, abdominal pain, nausea, melena, diarrhea, constipation, flank pain, dysuria, hematuria, urinary  Frequency, nocturia, numbness, tingling, seizures,  Focal weakness, Loss of consciousness,  Tremor, insomnia, depression, anxiety, and suicidal ideation.      Objective:  BP 138/74   Pulse 96   Ht 5' 2 (1.575 m)   Wt 104 lb 3.2 oz (47.3 kg)   SpO2 98%   BMI 19.06 kg/m   BP Readings from Last 3 Encounters:  09/20/23 138/74  09/14/23 113/70  05/12/23 125/84    Wt Readings from Last 3 Encounters:  09/20/23  104 lb 3.2 oz (47.3 kg)  09/14/23 111 lb 12.4 oz (50.7 kg)  05/12/23 103 lb 9.6 oz (47 kg)    Physical Exam Vitals reviewed.  Constitutional:      General: She is not in acute distress.    Appearance: Normal appearance. She is normal weight. She is not ill-appearing, toxic-appearing or diaphoretic.  HENT:     Head: Normocephalic.  Eyes:     General: No scleral icterus.       Right eye: No discharge.        Left eye: No discharge.     Conjunctiva/sclera: Conjunctivae normal.  Cardiovascular:      Rate and Rhythm: Normal rate and regular rhythm.     Heart sounds: Normal heart sounds.  Pulmonary:     Effort: Pulmonary effort is normal. No respiratory distress.     Breath sounds: Normal breath sounds.  Musculoskeletal:        General: Normal range of motion.  Skin:    General: Skin is warm and dry.  Neurological:     General: No focal deficit present.     Mental Status: She is alert and oriented to person, place, and time. Mental status is at baseline.  Psychiatric:        Mood and Affect: Mood normal.        Behavior: Behavior normal.        Thought Content: Thought content normal.        Judgment: Judgment normal.     Lab Results  Component Value Date   HGBA1C 4.9 09/11/2023   HGBA1C 6.3 (A) 03/19/2023   HGBA1C 4.7 05/28/2021    Lab Results  Component Value Date   CREATININE 0.59 09/14/2023   CREATININE 0.37 (L) 09/13/2023   CREATININE 0.31 (L) 09/13/2023    Lab Results  Component Value Date   WBC 8.9 09/14/2023   HGB 12.0 09/14/2023   HCT 33.2 (L) 09/14/2023   PLT 126 (L) 09/14/2023   GLUCOSE 108 (H) 09/14/2023   CHOL 181 04/23/2023   TRIG 100 04/23/2023   HDL 66 04/23/2023   LDLDIRECT 119.0 10/11/2014   LDLCALC 95 04/23/2023   ALT 24 09/12/2023   AST 47 (H) 09/12/2023   NA 132 (L) 09/14/2023   K 4.3 09/14/2023   CL 103 09/14/2023   CREATININE 0.59 09/14/2023   BUN 10 09/14/2023   CO2 20 (L) 09/14/2023   TSH 1.49 01/29/2023   INR 1.0 07/09/2022   HGBA1C 4.9 09/11/2023    ECHOCARDIOGRAM COMPLETE Result Date: 09/12/2023    ECHOCARDIOGRAM REPORT   Patient Name:   Kathy [name] Date of Exam: 09/12/2023 Medical Rec #:  [id number]           Height:       62.0 in Accession #:    [id number]          Weight:       106.3 lb Date of Birth:  December 12, 1960           BSA:          1.461 m Patient Age:    62 years            BP:           83/56 mmHg Patient Gender: F                   HR:           56 bpm. Exam Location:  [facility] Procedure: 2D  Echo, Cardiac  Doppler and Color Doppler (Both Spectral and Color            Flow Doppler were utilized during procedure). Indications:     Abnormal ECG R94.31  History:         Patient has prior history of Echocardiogram examinations, most                  recent 12/06/2022.  Sonographer:     Thedora Louder RDCS, FASE Referring Phys:  [id number] LONELL KANDICE MOOSE Diagnosing Phys: Cara JONETTA Lovelace MD IMPRESSIONS  1. Left ventricular ejection fraction, by estimation, is 55 to 60%. The left ventricle has normal function. The left ventricle has no regional wall motion abnormalities. There is mild left ventricular hypertrophy. Left ventricular diastolic parameters are consistent with Grade II diastolic dysfunction (pseudonormalization).  2. Right ventricular systolic function is normal. The right ventricular size is normal.  3. The mitral valve is normal in structure. Mild mitral valve regurgitation.  4. The aortic valve is normal in structure. Aortic valve regurgitation is not visualized. FINDINGS  Left Ventricle: Left ventricular ejection fraction, by estimation, is 55 to 60%. The left ventricle has normal function. The left ventricle has no regional wall motion abnormalities. Strain was performed and the global longitudinal strain is indeterminate. The left ventricular internal cavity size was normal in size. There is mild left ventricular hypertrophy. Left ventricular diastolic parameters are consistent with Grade II diastolic dysfunction (pseudonormalization). Right Ventricle: The right ventricular size is normal. No increase in right ventricular wall thickness. Right ventricular systolic function is normal. Left Atrium: Left atrial size was normal in size. Right Atrium: Right atrial size was normal in size. Pericardium: There is no evidence of pericardial effusion. Mitral Valve: The mitral valve is normal in structure. Mild mitral valve regurgitation. Tricuspid Valve: The tricuspid valve is normal in structure. Tricuspid valve  regurgitation is mild. Aortic Valve: The aortic valve is normal in structure. Aortic valve regurgitation is not visualized. Aortic valve peak gradient measures 3.1 mmHg. Pulmonic Valve: The pulmonic valve was normal in structure. Pulmonic valve regurgitation is not visualized. Aorta: The ascending aorta was not well visualized. IAS/Shunts: No atrial level shunt detected by color flow Doppler. Additional Comments: 3D was performed not requiring image post processing on an independent workstation and was indeterminate.  LEFT VENTRICLE PLAX 2D LVIDd:         3.10 cm   Diastology LVIDs:         2.20 cm   LV e' medial:    6.53 cm/s LV PW:         1.40 cm   LV E/e' medial:  14.4 LV IVS:        1.10 cm   LV e' lateral:   6.42 cm/s LVOT diam:     1.80 cm   LV E/e' lateral: 14.6 LV SV:         55 LV SV Index:   38 LVOT Area:     2.54 cm  RIGHT VENTRICLE RV Basal diam:  2.90 cm RV S prime:     9.79 cm/s TAPSE (M-mode): 1.8 cm LEFT ATRIUM           Index        RIGHT ATRIUM           Index LA diam:      2.40 cm 1.64 cm/m   RA Area:     11.80 cm LA Vol (A2C): 36.3 ml 24.84 ml/m  RA Volume:   24.70 ml  16.90 ml/m LA Vol (A4C): 30.8 ml 21.08 ml/m  AORTIC VALVE                 PULMONIC VALVE AV Area (Vmax): 2.16 cm     PV Vmax:        0.57 m/s AV Vmax:        88.40 cm/s   PV Peak grad:   1.3 mmHg AV Peak Grad:   3.1 mmHg     RVOT Peak grad: 1 mmHg LVOT Vmax:      75.00 cm/s LVOT Vmean:     49.000 cm/s LVOT VTI:       0.217 m  AORTA Ao Root diam: 3.50 cm Ao Asc diam:  3.30 cm MITRAL VALVE               TRICUSPID VALVE MV Area (PHT): 2.92 cm    TR Peak grad:   27.0 mmHg MV Decel Time: 260 msec    TR Vmax:        260.00 cm/s MV E velocity: 93.80 cm/s MV A velocity: 87.00 cm/s  SHUNTS MV E/A ratio:  1.08        Systemic VTI:  0.22 m                            Systemic Diam: 1.80 cm Dwayne JONETTA Lovelace MD Electronically signed by Cara JONETTA Lovelace MD Signature Date/Time: 09/12/2023/9:30:18 AM    Final     Assessment & Plan:   .Hyponatremia Assessment & Plan: Chronic, aggravated by alcoholism and nausea/vomiting.  Received hypertonis saline in ICU for soidum of 113.  Repeat sodium  is due today after discharge on July 22. Potassium and magnesium   also pending   Lab Results  Component Value Date   NA 132 (L) 09/14/2023   K 4.3 09/14/2023   CL 103 09/14/2023   CO2 20 (L) 09/14/2023     Orders: -     Comprehensive metabolic panel with GFR  Encounter for screening mammogram for malignant neoplasm of breast -     3D Screening Mammogram, Left and Right; Future  Thrombocytopenia (HCC) Assessment & Plan: Recurrent,  due to alcoholism . Repeat is due   Lab Results  Component Value Date   WBC 8.9 09/14/2023   HGB 12.0 09/14/2023   HCT 33.2 (L) 09/14/2023   MCV 91.2 09/14/2023   PLT 126 (L) 09/14/2023      Orders: -     CBC with Differential/Platelet  Itching Assessment & Plan: Controlled with gabapentin  and hydroxyzine  . Ok to increase hydroxyzine  to 20 mg at bedtime    Hospital discharge follow-up Assessment & Plan: Patient is stable post discharge and has no new issues or questions about discharge plans at the visit today for hospital follow up.  I have reviewed the records from the hospital admission in detail with patient today.    Alcoholic peripheral neuropathy (HCC) -     traMADol  HCl; Take 1 tablet (50 mg total) by mouth 2 (two) times daily.  Dispense: 60 tablet; Refill: 5  Other orders -     Omeprazole ; TAKE ONE CAPSULE BY MOUTH TWO TIMES DAILY  Dispense: 180 capsule; Refill: 1     I spent 34 minutes on the day of this face to face encounter reviewing patient's  most recent visit with cardiology,  nephrology,  and neurology,  prior relevant surgical and non  surgical procedures, recent  labs and imaging studies, counseling on weight management,  reviewing the assessment and plan with patient, and post visit ordering and reviewing of  diagnostics and therapeutics with patient  .    Follow-up: Return in about 6 months (around 03/22/2024) for chronic pain management.   Verneita LITTIE Kettering, MD

## 2023-09-20 NOTE — Assessment & Plan Note (Signed)
Patient is stable post discharge and has no new issues or questions about discharge plans at the visit today for hospital follow up.  I have reviewed the records from the hospital admission in detail with patient today. 

## 2023-09-21 ENCOUNTER — Ambulatory Visit: Payer: Self-pay | Admitting: Internal Medicine

## 2023-09-21 DIAGNOSIS — E871 Hypo-osmolality and hyponatremia: Secondary | ICD-10-CM | POA: Insufficient documentation

## 2023-09-21 DIAGNOSIS — E860 Dehydration: Secondary | ICD-10-CM | POA: Insufficient documentation

## 2023-09-21 LAB — COMPREHENSIVE METABOLIC PANEL WITH GFR
ALT: 10 U/L (ref 0–35)
AST: 16 U/L (ref 0–37)
Albumin: 4.3 g/dL (ref 3.5–5.2)
Alkaline Phosphatase: 67 U/L (ref 39–117)
BUN: 11 mg/dL (ref 6–23)
CO2: 23 meq/L (ref 19–32)
Calcium: 9.2 mg/dL (ref 8.4–10.5)
Chloride: 100 meq/L (ref 96–112)
Creatinine, Ser: 0.67 mg/dL (ref 0.40–1.20)
GFR: 94.13 mL/min (ref >60.00)
Glucose, Bld: 75 mg/dL (ref 70–99)
Potassium: 4.3 meq/L (ref 3.5–5.1)
Sodium: 136 meq/L (ref 135–145)
Total Bilirubin: 0.3 mg/dL (ref 0.2–1.2)
Total Protein: 7 g/dL (ref 6.0–8.3)

## 2023-09-21 LAB — CBC WITH DIFFERENTIAL/PLATELET
Basophils Absolute: 0.1 K/uL (ref 0.0–0.1)
Basophils Relative: 1.7 % (ref 0.0–3.0)
Eosinophils Absolute: 0.1 K/uL (ref 0.0–0.7)
Eosinophils Relative: 1.6 % (ref 0.0–5.0)
HCT: 37.2 % (ref 36.0–46.0)
Hemoglobin: 12.5 g/dL (ref 12.0–15.0)
Lymphocytes Relative: 38.5 % (ref 12.0–46.0)
Lymphs Abs: 3.1 K/uL (ref 0.7–4.0)
MCHC: 33.7 g/dL (ref 30.0–36.0)
MCV: 96.8 fl (ref 78.0–100.0)
Monocytes Absolute: 0.6 K/uL (ref 0.1–1.0)
Monocytes Relative: 7.4 % (ref 3.0–12.0)
Neutro Abs: 4.1 K/uL (ref 1.4–7.7)
Neutrophils Relative %: 50.8 % (ref 43.0–77.0)
Platelets: 288 K/uL (ref 150.0–400.0)
RBC: 3.84 Mil/uL — ABNORMAL LOW (ref 3.87–5.11)
RDW: 15.1 % (ref 11.5–15.5)
WBC: 8.1 K/uL (ref 4.0–10.5)

## 2023-09-21 NOTE — Assessment & Plan Note (Signed)
 Statin therapy has been continually deferred due to recurrent episodes of  alcoholic hepatitis .  Untreated LDL is < 100.  Will  readdress at next visit  Lab Results  Component Value Date   CHOL 181 04/23/2023   HDL 66 04/23/2023   LDLCALC 95 04/23/2023   LDLDIRECT 119.0 10/11/2014   TRIG 100 04/23/2023   CHOLHDL 2.7 04/23/2023

## 2023-09-21 NOTE — Assessment & Plan Note (Signed)
 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  Albumin was normal on July 20 hospital CMET  Repeat is pending    Lab Results  Component Value Date   CALCIUM  8.8 (L) 09/14/2023   PHOS 3.7 09/14/2023    Lab Results  Component Value Date   ALT 24 09/12/2023   AST 47 (H) 09/12/2023   ALKPHOS 83 09/12/2023   BILITOT 1.3 (H) 09/12/2023

## 2023-09-21 NOTE — Assessment & Plan Note (Signed)
 Secondary to alcoholism and recurrent alcohol  induced gastritis.  BMI is 19, but  appetite is improved post discharge

## 2023-09-21 NOTE — Assessment & Plan Note (Signed)
 Treated in house with hypertonic saline.  Sodium was 113 n admission and 132 at discharge on July 22.  Advised to hydrate with electrolyte replacemenet drinks,  NOT free water .  Repeat level is pending   Lab Results  Component Value Date   NA 132 (L) 09/14/2023   K 4.3 09/14/2023   CL 103 09/14/2023   CO2 20 (L) 09/14/2023

## 2023-09-21 NOTE — Assessment & Plan Note (Signed)
 Continue gabapentin  and hydroxyzine .  NO BENZODIAZEPINE REQUESTS  for longterm use WILL BE FULFILLED BY THIS OFFICE  due to her history of alcohol  dependence

## 2023-09-21 NOTE — Assessment & Plan Note (Signed)
 Mood has improved with adherence to sobriety and increased contact with family.  Continue twice weekly AA attendance

## 2023-09-21 NOTE — Assessment & Plan Note (Signed)
 She is receiving Prolia  injections . Last dose May 20 2023

## 2023-09-21 NOTE — Assessment & Plan Note (Signed)
She has stopped taking remeron.  She declines the start of any new antidepressants and will be entering rehab in 2 weeks.

## 2023-09-22 LAB — MISC LABCORP TEST (SEND OUT): Labcorp test code: 791584

## 2023-10-01 ENCOUNTER — Ambulatory Visit

## 2023-10-20 ENCOUNTER — Other Ambulatory Visit (HOSPITAL_COMMUNITY): Payer: Self-pay

## 2023-10-20 ENCOUNTER — Telehealth: Payer: Self-pay

## 2023-10-20 DIAGNOSIS — M810 Age-related osteoporosis without current pathological fracture: Secondary | ICD-10-CM

## 2023-10-20 NOTE — Telephone Encounter (Signed)
 [name]

## 2023-10-20 NOTE — Telephone Encounter (Signed)
 Prolia  VOB initiated via [email or web address]  Next Prolia  inj DUE: 11/20/23

## 2023-10-20 NOTE — Telephone Encounter (Signed)
 Pt ready for scheduling for PROLIA  on or after : 11/20/23  Option# 1: Buy/Bill (Office supplied medication)  Out-of-pocket cost due at time of clinic visit: $357  Number of injection/visits approved: 2  Primary: [employer] Prolia  co-insurance: 20% Admin fee co-insurance: 20%  Secondary: --- Prolia  co-insurance:  Admin fee co-insurance:   Medical Benefit Details: Date Benefits were checked: 10/20/23 Deductible: NO/ Coinsurance: 20%/ Admin Fee: 20%  Prior Auth: APPROVED PA# [id number] Expiration Date: 02/24/23-02/23/24   # of doses approved: 2 ----------------------------------------------------------------------- Option# 2- Med Obtained from pharmacy:  Pharmacy benefit: Copay $0 (Paid to pharmacy) Admin Fee: 20% (Pay at clinic)  Prior Auth: N/A PA# Expiration Date:   # of doses approved:   If patient wants fill through the pharmacy benefit please send prescription to: [facility], and include estimated need by date in rx notes. Pharmacy will ship medication directly to the office.  Patient NOT eligible for Prolia  Copay Card. Copay Card can make patient's cost as little as $25. Link to apply: [email or web address]://[email or web address]  ** This summary of benefits is an estimation of the patient's out-of-pocket cost. Exact cost may very based on individual plan coverage.

## 2023-10-26 ENCOUNTER — Ambulatory Visit (INDEPENDENT_AMBULATORY_CARE_PROVIDER_SITE_OTHER): Admitting: Internal Medicine

## 2023-10-26 ENCOUNTER — Encounter: Payer: Self-pay | Admitting: Internal Medicine

## 2023-10-26 VITALS — BP 116/68 | HR 80 | Ht 62.0 in | Wt 104.0 lb

## 2023-10-26 DIAGNOSIS — E538 Deficiency of other specified B group vitamins: Secondary | ICD-10-CM | POA: Diagnosis not present

## 2023-10-26 MED ORDER — HYDROXYZINE HCL 10 MG PO TABS
10.0000 mg | ORAL_TABLET | Freq: Two times a day (BID) | ORAL | 0 refills | Status: DC | PRN
Start: 2023-10-26 — End: 2023-10-26

## 2023-10-26 MED ORDER — CYANOCOBALAMIN 1000 MCG/ML IJ SOLN
1000.0000 ug | Freq: Once | INTRAMUSCULAR | Status: AC
  Administered 2023-10-26: 1000 ug via INTRAMUSCULAR

## 2023-10-26 MED ORDER — HYDROXYZINE HCL 10 MG PO TABS: 10.0000 mg | ORAL_TABLET | Freq: Three times a day (TID) | ORAL | 1 refills | Status: AC | PRN

## 2023-10-26 NOTE — Patient Instructions (Signed)
 I'm glad you are doing well!  Don't ever stop the program     Please send me your mother's contact information if she is requesting  to become my patinet'

## 2023-10-26 NOTE — Progress Notes (Unsigned)
 Subjective:  Patient ID: Kathy [name], female    DOB: [birth year]  Age: 63 y.o. MRN: [id number]  CC: There were no encounter diagnoses.   HPI Kathy [name] presents for  Chief Complaint  Patient presents with   Medical Management of Chronic Issues    Neuropathy:  using gabapentin  and tramadol  at night  gabapentin  (1) in the morning   Outpatient Medications Prior to Visit  Medication Sig Dispense Refill   gabapentin  (NEURONTIN ) 400 MG capsule Take 2 capsules (800 mg total) by mouth 3 (three) times daily. 180 capsule 2   Multiple Vitamin (MULTIVITAMIN WITH MINERALS) TABS tablet Take 1 tablet by mouth daily.     omeprazole  (PRILOSEC) 40 MG capsule TAKE ONE CAPSULE BY MOUTH TWO TIMES DAILY 180 capsule 1   ondansetron  (ZOFRAN -ODT) 8 MG disintegrating tablet TAKE 1 TABLET BY MOUTH EVERY 8 HOURS AS NEEDED FOR NAUSEA AND VOMITING 30 tablet 2   traMADol  (ULTRAM ) 50 MG tablet Take 1 tablet (50 mg total) by mouth 2 (two) times daily. 60 tablet 5   Facility-Administered Medications Prior to Visit  Medication Dose Route Frequency Provider Last Rate Last Admin   [START ON 11/20/2023] denosumab  (PROLIA ) injection 60 mg  60 mg Subcutaneous Q6 months Dineen Rollene MATSU, FNP        Review of Systems;  Patient denies headache, fevers, malaise, unintentional weight loss, skin rash, eye pain, sinus congestion and sinus pain, sore throat, dysphagia,  hemoptysis , cough, dyspnea, wheezing, chest pain, palpitations, orthopnea, edema, abdominal pain, nausea, melena, diarrhea, constipation, flank pain, dysuria, hematuria, urinary  Frequency, nocturia, numbness, tingling, seizures,  Focal weakness, Loss of consciousness,  Tremor, insomnia, depression, anxiety, and suicidal ideation.      Objective:  BP 116/68   Pulse 80   Ht 5' 2 (1.575 m)   Wt 104 lb (47.2 kg)   SpO2 97%   BMI 19.02 kg/m   BP Readings from Last 3 Encounters:  10/26/23 116/68  09/20/23 138/74  09/14/23 113/70     Wt Readings from Last 3 Encounters:  10/26/23 104 lb (47.2 kg)  09/20/23 104 lb 3.2 oz (47.3 kg)  09/14/23 111 lb 12.4 oz (50.7 kg)    Physical Exam  Lab Results  Component Value Date   HGBA1C 4.9 09/11/2023   HGBA1C 6.3 (A) 03/19/2023   HGBA1C 4.7 05/28/2021    Lab Results  Component Value Date   CREATININE 0.67 09/20/2023   CREATININE 0.59 09/14/2023   CREATININE 0.37 (L) 09/13/2023    Lab Results  Component Value Date   WBC 8.1 09/20/2023   HGB 12.5 09/20/2023   HCT 37.2 09/20/2023   PLT 288.0 09/20/2023   GLUCOSE 75 09/20/2023   CHOL 181 04/23/2023   TRIG 100 04/23/2023   HDL 66 04/23/2023   LDLDIRECT 119.0 10/11/2014   LDLCALC 95 04/23/2023   ALT 10 09/20/2023   AST 16 09/20/2023   NA 136 09/20/2023   K 4.3 09/20/2023   CL 100 09/20/2023   CREATININE 0.67 09/20/2023   BUN 11 09/20/2023   CO2 23 09/20/2023   TSH 1.49 01/29/2023   INR 1.0 07/09/2022   HGBA1C 4.9 09/11/2023    ECHOCARDIOGRAM COMPLETE Result Date: 09/12/2023    ECHOCARDIOGRAM REPORT   Patient Name:   Kathy [name] Date of Exam: 09/12/2023 Medical Rec #:  [id number]           Height:       62.0 in Accession #:    [id number]  Weight:       106.3 lb Date of Birth:  1960-06-06           BSA:          1.461 m Patient Age:    62 years            BP:           83/56 mmHg Patient Gender: F                   HR:           56 bpm. Exam Location:  [facility] Procedure: 2D Echo, Cardiac Doppler and Color Doppler (Both Spectral and Color            Flow Doppler were utilized during procedure). Indications:     Abnormal ECG R94.31  History:         Patient has prior history of Echocardiogram examinations, most                  recent 12/06/2022.  Sonographer:     Thedora Louder RDCS, FASE Referring Phys:  [id number] LONELL KANDICE MOOSE Diagnosing Phys: Cara JONETTA Lovelace MD IMPRESSIONS  1. Left ventricular ejection fraction, by estimation, is 55 to 60%. The left ventricle has normal function. The left ventricle  has no regional wall motion abnormalities. There is mild left ventricular hypertrophy. Left ventricular diastolic parameters are consistent with Grade II diastolic dysfunction (pseudonormalization).  2. Right ventricular systolic function is normal. The right ventricular size is normal.  3. The mitral valve is normal in structure. Mild mitral valve regurgitation.  4. The aortic valve is normal in structure. Aortic valve regurgitation is not visualized. FINDINGS  Left Ventricle: Left ventricular ejection fraction, by estimation, is 55 to 60%. The left ventricle has normal function. The left ventricle has no regional wall motion abnormalities. Strain was performed and the global longitudinal strain is indeterminate. The left ventricular internal cavity size was normal in size. There is mild left ventricular hypertrophy. Left ventricular diastolic parameters are consistent with Grade II diastolic dysfunction (pseudonormalization). Right Ventricle: The right ventricular size is normal. No increase in right ventricular wall thickness. Right ventricular systolic function is normal. Left Atrium: Left atrial size was normal in size. Right Atrium: Right atrial size was normal in size. Pericardium: There is no evidence of pericardial effusion. Mitral Valve: The mitral valve is normal in structure. Mild mitral valve regurgitation. Tricuspid Valve: The tricuspid valve is normal in structure. Tricuspid valve regurgitation is mild. Aortic Valve: The aortic valve is normal in structure. Aortic valve regurgitation is not visualized. Aortic valve peak gradient measures 3.1 mmHg. Pulmonic Valve: The pulmonic valve was normal in structure. Pulmonic valve regurgitation is not visualized. Aorta: The ascending aorta was not well visualized. IAS/Shunts: No atrial level shunt detected by color flow Doppler. Additional Comments: 3D was performed not requiring image post processing on an independent workstation and was indeterminate.  LEFT  VENTRICLE PLAX 2D LVIDd:         3.10 cm   Diastology LVIDs:         2.20 cm   LV e' medial:    6.53 cm/s LV PW:         1.40 cm   LV E/e' medial:  14.4 LV IVS:        1.10 cm   LV e' lateral:   6.42 cm/s LVOT diam:     1.80 cm   LV E/e' lateral: 14.6 LV SV:  55 LV SV Index:   38 LVOT Area:     2.54 cm  RIGHT VENTRICLE RV Basal diam:  2.90 cm RV S prime:     9.79 cm/s TAPSE (M-mode): 1.8 cm LEFT ATRIUM           Index        RIGHT ATRIUM           Index LA diam:      2.40 cm 1.64 cm/m   RA Area:     11.80 cm LA Vol (A2C): 36.3 ml 24.84 ml/m  RA Volume:   24.70 ml  16.90 ml/m LA Vol (A4C): 30.8 ml 21.08 ml/m  AORTIC VALVE                 PULMONIC VALVE AV Area (Vmax): 2.16 cm     PV Vmax:        0.57 m/s AV Vmax:        88.40 cm/s   PV Peak grad:   1.3 mmHg AV Peak Grad:   3.1 mmHg     RVOT Peak grad: 1 mmHg LVOT Vmax:      75.00 cm/s LVOT Vmean:     49.000 cm/s LVOT VTI:       0.217 m  AORTA Ao Root diam: 3.50 cm Ao Asc diam:  3.30 cm MITRAL VALVE               TRICUSPID VALVE MV Area (PHT): 2.92 cm    TR Peak grad:   27.0 mmHg MV Decel Time: 260 msec    TR Vmax:        260.00 cm/s MV E velocity: 93.80 cm/s MV A velocity: 87.00 cm/s  SHUNTS MV E/A ratio:  1.08        Systemic VTI:  0.22 m                            Systemic Diam: 1.80 cm Dwayne JONETTA Lovelace MD Electronically signed by Cara JONETTA Lovelace MD Signature Date/Time: 09/12/2023/9:30:18 AM    Final     Assessment & Plan:  .There are no diagnoses linked to this encounter.   I spent 34 minutes on the day of this face to face encounter reviewing patient's  most recent visit with cardiology,  nephrology,  and neurology,  prior relevant surgical and non surgical procedures, recent  labs and imaging studies, counseling on weight management,  reviewing the assessment and plan with patient, and post visit ordering and reviewing of  diagnostics and therapeutics with patient  .   Follow-up: No follow-ups on file.   Kathy LITTIE Kettering, MD

## 2023-10-26 NOTE — Assessment & Plan Note (Signed)
 SHE REPORTS ABSTINENCE FROM ALL ALCOHOLIC BEVERAGES [facility] DISCHARGE and is attending AA meetings 2-4 times per week

## 2023-10-26 NOTE — Assessment & Plan Note (Signed)
 Her foot pain is managed with gabapentin  400 mg dose 3 to 4 times daily  and tramadol  50 mg twice daily as of Apr 13 2023

## 2023-10-26 NOTE — Assessment & Plan Note (Signed)
 Contiue parenteral supplementation every 2 weeks  Lab Results  Component Value Date   VITAMINB12 421 04/23/2023

## 2023-10-27 ENCOUNTER — Encounter: Payer: Self-pay | Admitting: Internal Medicine

## 2023-11-10 ENCOUNTER — Ambulatory Visit (INDEPENDENT_AMBULATORY_CARE_PROVIDER_SITE_OTHER): Admitting: Internal Medicine

## 2023-11-10 ENCOUNTER — Encounter: Payer: Self-pay | Admitting: Internal Medicine

## 2023-11-10 ENCOUNTER — Telehealth: Payer: Self-pay

## 2023-11-10 VITALS — BP 128/68 | HR 76 | Temp 98.1°F | Ht 62.0 in | Wt 105.8 lb

## 2023-11-10 DIAGNOSIS — H00011 Hordeolum externum right upper eyelid: Secondary | ICD-10-CM

## 2023-11-10 DIAGNOSIS — E538 Deficiency of other specified B group vitamins: Secondary | ICD-10-CM | POA: Diagnosis not present

## 2023-11-10 DIAGNOSIS — J0110 Acute frontal sinusitis, unspecified: Secondary | ICD-10-CM

## 2023-11-10 DIAGNOSIS — H00019 Hordeolum externum unspecified eye, unspecified eyelid: Secondary | ICD-10-CM | POA: Insufficient documentation

## 2023-11-10 MED ORDER — PREDNISONE 10 MG PO TABS: ORAL_TABLET | ORAL | 0 refills | Status: AC

## 2023-11-10 MED ORDER — ERYTHROMYCIN 5 MG/GM OP OINT
1.0000 | TOPICAL_OINTMENT | Freq: Every day | OPHTHALMIC | 0 refills | Status: AC
Start: 2023-11-10 — End: ?

## 2023-11-10 MED ORDER — AMOXICILLIN-POT CLAVULANATE 875-125 MG PO TABS: 1.0000 | ORAL_TABLET | Freq: Two times a day (BID) | ORAL | 0 refills | Status: AC

## 2023-11-10 MED ORDER — CYANOCOBALAMIN 1000 MCG/ML IJ SOLN
1000.0000 ug | Freq: Once | INTRAMUSCULAR | Status: AC
  Administered 2023-11-10: 1000 ug via INTRAMUSCULAR

## 2023-11-10 NOTE — Assessment & Plan Note (Signed)
 Given chronicity of symptoms, development of facial pain and exam consistent with bacterial URI,  Will treat with empiric antibiotics, prednisone  taper, decongestants

## 2023-11-10 NOTE — Progress Notes (Signed)
 Subjective:  Patient ID: Kathy [name], female    DOB: [birth year]  Age: 63 y.o. MRN: [id number]  CC: The primary encounter diagnosis was Acute non-recurrent frontal sinusitis. Diagnoses of Hordeolum externum of right upper eyelid and B12 deficiency were also pertinent to this visit.   HPI Kathy [name] presents for  Chief Complaint  Patient presents with   Cough    Cough, sinus pain and pressure, eyes are watering and burning x 2 weeks   2 week history of sinus congestion and rhinorrhea now progressing to pain accompanied by purulent drainage . Started with sore throat,  had sick contacts.  Took cough medication and antihistamine but now having chest tightness .  Had fever for several days.  Work up today with stye on right upper lid    Outpatient Medications Prior to Visit  Medication Sig Dispense Refill   gabapentin  (NEURONTIN ) 400 MG capsule Take 2 capsules (800 mg total) by mouth 3 (three) times daily. 180 capsule 2   hydrOXYzine  (ATARAX ) 10 MG tablet Take 1 tablet (10 mg total) by mouth every 8 (eight) hours as needed. 270 tablet 1   Multiple Vitamin (MULTIVITAMIN WITH MINERALS) TABS tablet Take 1 tablet by mouth daily.     omeprazole  (PRILOSEC) 40 MG capsule TAKE ONE CAPSULE BY MOUTH TWO TIMES DAILY 180 capsule 1   ondansetron  (ZOFRAN -ODT) 8 MG disintegrating tablet TAKE 1 TABLET BY MOUTH EVERY 8 HOURS AS NEEDED FOR NAUSEA AND VOMITING 30 tablet 2   traMADol  (ULTRAM ) 50 MG tablet Take 1 tablet (50 mg total) by mouth 2 (two) times daily. 60 tablet 5   Facility-Administered Medications Prior to Visit  Medication Dose Route Frequency Provider Last Rate Last Admin   [START ON 11/20/2023] denosumab  (PROLIA ) injection 60 mg  60 mg Subcutaneous Q6 months Dineen Rollene MATSU, FNP        Review of Systems;  Patient denies headache, fevers, malaise, unintentional weight loss, skin rash, eye pain, sinus congestion and sinus pain, sore throat, dysphagia,  hemoptysis , cough,  dyspnea, wheezing, chest pain, palpitations, orthopnea, edema, abdominal pain, nausea, melena, diarrhea, constipation, flank pain, dysuria, hematuria, urinary  Frequency, nocturia, numbness, tingling, seizures,  Focal weakness, Loss of consciousness,  Tremor, insomnia, depression, anxiety, and suicidal ideation.      Objective:  BP 128/68   Pulse 76   Temp 98.1 F (36.7 C) (Oral)   Ht 5' 2 (1.575 m)   Wt 105 lb 12.8 oz (48 kg)   SpO2 98%   BMI 19.35 kg/m   BP Readings from Last 3 Encounters:  11/10/23 128/68  10/26/23 116/68  09/20/23 138/74    Wt Readings from Last 3 Encounters:  11/10/23 105 lb 12.8 oz (48 kg)  10/26/23 104 lb (47.2 kg)  09/20/23 104 lb 3.2 oz (47.3 kg)    Physical Exam Vitals reviewed.  Constitutional:      General: She is not in acute distress.    Appearance: Normal appearance. She is normal weight. She is not ill-appearing, toxic-appearing or diaphoretic.  HENT:     Head: Normocephalic.  Eyes:     General: No scleral icterus.       Right eye: No discharge.        Left eye: No discharge.     Extraocular Movements: Extraocular movements intact.     Conjunctiva/sclera: Conjunctivae normal.     Pupils: Pupils are equal, round, and reactive to light.      Comments: Right eyelid stye  Cardiovascular:     Rate and Rhythm: Normal rate and regular rhythm.     Heart sounds: Normal heart sounds.  Pulmonary:     Effort: Pulmonary effort is normal. No respiratory distress.     Breath sounds: Normal breath sounds.  Musculoskeletal:        General: Normal range of motion.  Skin:    General: Skin is warm and dry.  Neurological:     General: No focal deficit present.     Mental Status: She is alert and oriented to person, place, and time. Mental status is at baseline.  Psychiatric:        Mood and Affect: Mood normal.        Behavior: Behavior normal.        Thought Content: Thought content normal.        Judgment: Judgment normal.     Lab  Results  Component Value Date   HGBA1C 4.9 09/11/2023   HGBA1C 6.3 (A) 03/19/2023   HGBA1C 4.7 05/28/2021    Lab Results  Component Value Date   CREATININE 0.67 09/20/2023   CREATININE 0.59 09/14/2023   CREATININE 0.37 (L) 09/13/2023    Lab Results  Component Value Date   WBC 8.1 09/20/2023   HGB 12.5 09/20/2023   HCT 37.2 09/20/2023   PLT 288.0 09/20/2023   GLUCOSE 75 09/20/2023   CHOL 181 04/23/2023   TRIG 100 04/23/2023   HDL 66 04/23/2023   LDLDIRECT 119.0 10/11/2014   LDLCALC 95 04/23/2023   ALT 10 09/20/2023   AST 16 09/20/2023   NA 136 09/20/2023   K 4.3 09/20/2023   CL 100 09/20/2023   CREATININE 0.67 09/20/2023   BUN 11 09/20/2023   CO2 23 09/20/2023   TSH 1.49 01/29/2023   INR 1.0 07/09/2022   HGBA1C 4.9 09/11/2023    ECHOCARDIOGRAM COMPLETE Result Date: 09/12/2023    ECHOCARDIOGRAM REPORT   Patient Name:   Kathy [name] Date of Exam: 09/12/2023 Medical Rec #:  [id number]           Height:       62.0 in Accession #:    [id number]          Weight:       106.3 lb Date of Birth:  [birth year]           BSA:          1.461 m Patient Age:    62 years            BP:           83/56 mmHg Patient Gender: F                   HR:           56 bpm. Exam Location:  [facility] Procedure: 2D Echo, Cardiac Doppler and Color Doppler (Both Spectral and Color            Flow Doppler were utilized during procedure). Indications:     Abnormal ECG R94.31  History:         Patient has prior history of Echocardiogram examinations, most                  recent 12/06/2022.  Sonographer:     Thedora Louder RDCS, FASE Referring Phys:  [id number] LONELL KANDICE MOOSE Diagnosing Phys: Cara JONETTA Lovelace MD IMPRESSIONS  1. Left ventricular ejection fraction, by estimation, is 55 to 60%. The left ventricle has normal function.  The left ventricle has no regional wall motion abnormalities. There is mild left ventricular hypertrophy. Left ventricular diastolic parameters are consistent with Grade II diastolic  dysfunction (pseudonormalization).  2. Right ventricular systolic function is normal. The right ventricular size is normal.  3. The mitral valve is normal in structure. Mild mitral valve regurgitation.  4. The aortic valve is normal in structure. Aortic valve regurgitation is not visualized. FINDINGS  Left Ventricle: Left ventricular ejection fraction, by estimation, is 55 to 60%. The left ventricle has normal function. The left ventricle has no regional wall motion abnormalities. Strain was performed and the global longitudinal strain is indeterminate. The left ventricular internal cavity size was normal in size. There is mild left ventricular hypertrophy. Left ventricular diastolic parameters are consistent with Grade II diastolic dysfunction (pseudonormalization). Right Ventricle: The right ventricular size is normal. No increase in right ventricular wall thickness. Right ventricular systolic function is normal. Left Atrium: Left atrial size was normal in size. Right Atrium: Right atrial size was normal in size. Pericardium: There is no evidence of pericardial effusion. Mitral Valve: The mitral valve is normal in structure. Mild mitral valve regurgitation. Tricuspid Valve: The tricuspid valve is normal in structure. Tricuspid valve regurgitation is mild. Aortic Valve: The aortic valve is normal in structure. Aortic valve regurgitation is not visualized. Aortic valve peak gradient measures 3.1 mmHg. Pulmonic Valve: The pulmonic valve was normal in structure. Pulmonic valve regurgitation is not visualized. Aorta: The ascending aorta was not well visualized. IAS/Shunts: No atrial level shunt detected by color flow Doppler. Additional Comments: 3D was performed not requiring image post processing on an independent workstation and was indeterminate.  LEFT VENTRICLE PLAX 2D LVIDd:         3.10 cm   Diastology LVIDs:         2.20 cm   LV e' medial:    6.53 cm/s LV PW:         1.40 cm   LV E/e' medial:  14.4 LV IVS:         1.10 cm   LV e' lateral:   6.42 cm/s LVOT diam:     1.80 cm   LV E/e' lateral: 14.6 LV SV:         55 LV SV Index:   38 LVOT Area:     2.54 cm  RIGHT VENTRICLE RV Basal diam:  2.90 cm RV S prime:     9.79 cm/s TAPSE (M-mode): 1.8 cm LEFT ATRIUM           Index        RIGHT ATRIUM           Index LA diam:      2.40 cm 1.64 cm/m   RA Area:     11.80 cm LA Vol (A2C): 36.3 ml 24.84 ml/m  RA Volume:   24.70 ml  16.90 ml/m LA Vol (A4C): 30.8 ml 21.08 ml/m  AORTIC VALVE                 PULMONIC VALVE AV Area (Vmax): 2.16 cm     PV Vmax:        0.57 m/s AV Vmax:        88.40 cm/s   PV Peak grad:   1.3 mmHg AV Peak Grad:   3.1 mmHg     RVOT Peak grad: 1 mmHg LVOT Vmax:      75.00 cm/s LVOT Vmean:     49.000 cm/s LVOT VTI:  0.217 m  AORTA Ao Root diam: 3.50 cm Ao Asc diam:  3.30 cm MITRAL VALVE               TRICUSPID VALVE MV Area (PHT): 2.92 cm    TR Peak grad:   27.0 mmHg MV Decel Time: 260 msec    TR Vmax:        260.00 cm/s MV E velocity: 93.80 cm/s MV A velocity: 87.00 cm/s  SHUNTS MV E/A ratio:  1.08        Systemic VTI:  0.22 m                            Systemic Diam: 1.80 cm Cara JONETTA Lovelace MD Electronically signed by Cara JONETTA Lovelace MD Signature Date/Time: 09/12/2023/9:30:18 AM    Final     Assessment & Plan:  .Acute non-recurrent frontal sinusitis Assessment & Plan: Given chronicity of symptoms, development of facial pain and exam consistent with bacterial URI,  Will treat with empiric antibiotics, prednisone  taper, decongestants    Hordeolum externum of right upper eyelid Assessment & Plan: Erythromycin  ophthalmoc ointment for right eye stye if cool compresses fail to resolve   Orders: -     Erythromycin ; Place 1 Application into the right eye at bedtime.  Dispense: 3.5 g; Refill: 0  B12 deficiency -     Cyanocobalamin   Other orders -     predniSONE ; 6 tablets on Day 1 , then reduce by 1 tablet daily until gone  Dispense: 21 tablet; Refill: 0 -     Amoxicillin -Pot  Clavulanate; Take 1 tablet by mouth 2 (two) times daily.  Dispense: 14 tablet; Refill: 0     I spent 34 minutes on the day of this face to face encounter reviewing patient's  most recent visit with cardiology,  nephrology,  and neurology,  prior relevant surgical and non surgical procedures, recent  labs and imaging studies, counseling on weight management,  reviewing the assessment and plan with patient, and post visit ordering and reviewing of  diagnostics and therapeutics with patient  .   Follow-up: No follow-ups on file.   Verneita LITTIE Kettering, MD

## 2023-11-10 NOTE — Telephone Encounter (Signed)
 Copied from CRM [id number]. Topic: Clinical - Medication Question >> Nov 10, 2023  9:35 AM Marylynn H wrote: Reason for CRM:   Patient was seen back on 09/02 and has started to have sinus issues. Patient wants to know if provider can call in amoxicillin  to help treat for infection. States she has also been having watery/burning eyes and now has a stye, wanting to know if an ointment can be called in as well. Please reach out and advise. # [phone]   [facility] - [place], [place] - [address]

## 2023-11-10 NOTE — Telephone Encounter (Signed)
 Spoke with pt and scheduled her for an appt today at 1:30 with Dr. Tullo.

## 2023-11-10 NOTE — Assessment & Plan Note (Signed)
 Erythromycin  ophthalmoc ointment for right eye stye if cool compresses fail to resolve

## 2023-11-10 NOTE — Patient Instructions (Addendum)
 I am treating you for bacterial sinusitis which is a complication from your viral infection due to  persistent sinus congestion.   I am prescribing an antibiotic (augmentin   and a prednisone  taper  To manage the infection and the inflammation in your  chest/sinuses.   I also advise use of the following OTC meds to help with your other symptoms.   Tok to add afrin for 3 days if needed for congestion to your current cough syrup   Please take a generic probiotic ( Align, Floraque or Culturelle) OR EAT YOGURT DAILY  for three weeks since you are taking an  antibiotic to prevent a very serious antibiotic associated infection  Called closttidium dificile colitis that can cause diarrhea, multi organ failure, sepsis and death if not managed.

## 2023-11-25 ENCOUNTER — Ambulatory Visit
Admission: RE | Admit: 2023-11-25 | Discharge: 2023-11-25 | Disposition: A | Discharge status: Home / Self Care | Source: Ambulatory Visit | Attending: Internal Medicine | Admitting: Internal Medicine

## 2023-11-25 DIAGNOSIS — Z1231 Encounter for screening mammogram for malignant neoplasm of breast: Secondary | ICD-10-CM | POA: Diagnosis not present

## 2023-12-07 ENCOUNTER — Telehealth: Payer: Self-pay

## 2023-12-07 NOTE — Telephone Encounter (Signed)
 Copied from CRM [id number]. Topic: Appointments - Appointment Scheduling >> Dec 07, 2023 12:47 PM Pinkey ORN wrote: Patient/patient representative is calling to schedule an appointment. Refer to attachments for appointment information. >> Dec 07, 2023 12:49 PM Pinkey ORN wrote: Patient is calling to schedule to have her Prolia  injection. Please follow up with patient.

## 2023-12-08 ENCOUNTER — Ambulatory Visit (INDEPENDENT_AMBULATORY_CARE_PROVIDER_SITE_OTHER)

## 2023-12-08 DIAGNOSIS — E538 Deficiency of other specified B group vitamins: Secondary | ICD-10-CM | POA: Diagnosis not present

## 2023-12-08 MED ORDER — CYANOCOBALAMIN 1000 MCG/ML IJ SOLN
1000.0000 ug | Freq: Once | INTRAMUSCULAR | Status: AC
  Administered 2023-12-08: 1000 ug via INTRAMUSCULAR

## 2023-12-08 NOTE — Progress Notes (Signed)
Pt received B12 injection in right deltoid muscle. Pt tolerated it well with no complaints or concerns.  

## 2023-12-08 NOTE — Telephone Encounter (Signed)
 I left another voicemail for patient asking her to please call us  back to schedule her Prolia  injection.  E2C2 - when patient calls back, please assist her with scheduling an appointment for her Prolia  injection.

## 2023-12-08 NOTE — Telephone Encounter (Signed)
 Noted

## 2023-12-13 ENCOUNTER — Ambulatory Visit

## 2023-12-13 ENCOUNTER — Telehealth: Payer: Self-pay | Admitting: Internal Medicine

## 2023-12-13 MED ORDER — DENOSUMAB 60 MG/ML ~~LOC~~ SOSY
60.0000 mg | PREFILLED_SYRINGE | SUBCUTANEOUS | 1 refills | Status: AC
  Filled 2023-12-13 – 2023-12-14 (×2): qty 1, 180d supply, fill #0

## 2023-12-13 NOTE — Addendum Note (Signed)
 Addended by: BRIEN SHARENE RAMAN on: 12/13/2023 07:47 PM   Modules accepted: Orders

## 2023-12-13 NOTE — Telephone Encounter (Signed)
 Pt read mychart message on 10/7. She needed to decide which option she wanted to choose before we could schedule her Prolia . Since patient never responded I have ordered her medication through [facility].Pt will need to call them to setup delivery date and then call back to schedule her NV.

## 2023-12-13 NOTE — Telephone Encounter (Signed)
 Pt needs her Prolia  shot and pt is wanting to know if she needs prior authorization to get the shot.

## 2023-12-14 ENCOUNTER — Other Ambulatory Visit: Payer: Self-pay | Admitting: Pharmacy Technician

## 2023-12-14 ENCOUNTER — Encounter (HOSPITAL_COMMUNITY): Payer: Self-pay

## 2023-12-14 ENCOUNTER — Other Ambulatory Visit: Payer: Self-pay

## 2023-12-14 NOTE — Progress Notes (Signed)
 Specialty Pharmacy Initial Fill Coordination Note  Kathy [name] is a 63 y.o. female contacted today regarding initial fill of specialty medication(s) Denosumab  (PROLIA )   Patient requested Courier to Provider Office   Delivery date: 12/16/23   Verified address: [facility] [address]   Medication will be filled on 10/22.   Patient is aware of $0 copayment.

## 2023-12-14 NOTE — Progress Notes (Signed)
 Pharmacy Patient Advocate Encounter  Insurance verification completed.   The patient is insured through [facility]   Ran test claim for Prolia . Co-pay is $0.  This test claim was processed through [facility]- copay amounts may vary at other pharmacies due to pharmacy/plan contracts, or as the patient moves through the different stages of their insurance plan.

## 2023-12-15 NOTE — Telephone Encounter (Signed)
 Medication will be delivered on 10/23 & pt scheduled for NV

## 2023-12-21 NOTE — Telephone Encounter (Signed)
 Patient is scheduled for Prolia  injection 12/27/23.

## 2023-12-27 ENCOUNTER — Ambulatory Visit (INDEPENDENT_AMBULATORY_CARE_PROVIDER_SITE_OTHER)

## 2023-12-27 DIAGNOSIS — E538 Deficiency of other specified B group vitamins: Secondary | ICD-10-CM

## 2023-12-27 DIAGNOSIS — M810 Age-related osteoporosis without current pathological fracture: Secondary | ICD-10-CM | POA: Diagnosis not present

## 2023-12-27 MED ORDER — CYANOCOBALAMIN 1000 MCG/ML IJ SOLN
1000.0000 ug | Freq: Once | INTRAMUSCULAR | Status: AC
  Administered 2023-12-27: 1000 ug via INTRAMUSCULAR

## 2023-12-27 MED ORDER — DENOSUMAB 60 MG/ML ~~LOC~~ SOSY: 60.0000 mg | PREFILLED_SYRINGE | SUBCUTANEOUS | Status: AC

## 2023-12-27 NOTE — Progress Notes (Signed)
 Pt presented for their subcutaneous Prolia  injection. Pt was identified through two identifiers. Pt was given the information packets about the Prolia  and told to schedule their next injection 6 months out. Pt tolerated the subq injection well in the left  arm.   Pt presented for their vitamin B12 injection. Pt was identified through two identifiers. Pt tolerated shot well in their right deltoid.

## 2024-01-27 ENCOUNTER — Other Ambulatory Visit: Payer: Self-pay | Admitting: Internal Medicine

## 2024-02-16 ENCOUNTER — Telehealth: Payer: Self-pay | Admitting: Internal Medicine

## 2024-02-16 NOTE — Telephone Encounter (Signed)
 Copied from CRM [id number]. Topic: Appointments - Scheduling Inquiry for Clinic >> Feb 16, 2024  9:46 AM Adelita E wrote: Reason for CRM: Patient is due for her colonoscopy and endoscopy, patient stated she usually gets this done every 3 years at [facility].

## 2024-02-18 NOTE — Telephone Encounter (Signed)
 LVM for pt to return call back. Pt had colonoscopy and endoscopy done in 2024. Pt will not be due until 2027. Okay to relay message

## 2024-02-22 NOTE — Telephone Encounter (Signed)
 LMTCB

## 2024-02-23 NOTE — Telephone Encounter (Signed)
 Detailed vm left informng pt that she is not due until 207 that per records her last coonioscopy was done 08-31-2022 so she ius not due until 08-30-2025  Mychart msg sent as well   E2C2 IT S OKAY TO RELAY MSG

## 2024-02-28 ENCOUNTER — Ambulatory Visit (INDEPENDENT_AMBULATORY_CARE_PROVIDER_SITE_OTHER)

## 2024-02-28 DIAGNOSIS — E538 Deficiency of other specified B group vitamins: Secondary | ICD-10-CM

## 2024-02-28 MED ORDER — CYANOCOBALAMIN 1000 MCG/ML IJ SOLN
1000.0000 ug | Freq: Once | INTRAMUSCULAR | Status: AC
  Administered 2024-02-28: 1000 ug via INTRAMUSCULAR

## 2024-02-28 NOTE — Progress Notes (Signed)
 Pt presented for their vitamin B12 injection. Pt was identified through two identifiers. Pt tolerated shot well in their left deltoid.

## 2024-03-29 ENCOUNTER — Other Ambulatory Visit: Payer: Self-pay | Admitting: Internal Medicine

## 2024-03-29 DIAGNOSIS — G621 Alcoholic polyneuropathy: Secondary | ICD-10-CM

## 2024-04-03 ENCOUNTER — Ambulatory Visit

## 2024-04-10 ENCOUNTER — Ambulatory Visit: Admitting: Internal Medicine

## 2024-05-15 ENCOUNTER — Encounter: Admitting: Internal Medicine
